# Patient Record
Sex: Female | Born: 1970 | Race: Black or African American | Hispanic: No | Marital: Married | Smoking: Never smoker
Health system: Southern US, Community
[De-identification: ages and names within clinical notes are randomized; demographics above are authoritative.]

## PROBLEM LIST (undated history)

## (undated) DIAGNOSIS — Z9221 Personal history of antineoplastic chemotherapy: Secondary | ICD-10-CM

## (undated) DIAGNOSIS — C50919 Malignant neoplasm of unspecified site of unspecified female breast: Secondary | ICD-10-CM

## (undated) DIAGNOSIS — Z8669 Personal history of other diseases of the nervous system and sense organs: Secondary | ICD-10-CM

## (undated) DIAGNOSIS — G43909 Migraine, unspecified, not intractable, without status migrainosus: Secondary | ICD-10-CM

## (undated) DIAGNOSIS — Z923 Personal history of irradiation: Secondary | ICD-10-CM

## (undated) DIAGNOSIS — I10 Essential (primary) hypertension: Secondary | ICD-10-CM

## (undated) DIAGNOSIS — T7840XA Allergy, unspecified, initial encounter: Secondary | ICD-10-CM

## (undated) HISTORY — DX: Migraine, unspecified, not intractable, without status migrainosus: G43.909

## (undated) HISTORY — DX: Malignant neoplasm of unspecified site of unspecified female breast: C50.919

## (undated) HISTORY — DX: Allergy, unspecified, initial encounter: T78.40XA

## (undated) HISTORY — PX: ABDOMINAL HYSTERECTOMY: SHX81

## (undated) HISTORY — DX: Personal history of antineoplastic chemotherapy: Z92.21

## (undated) HISTORY — DX: Personal history of irradiation: Z92.3

---

## 1999-05-25 ENCOUNTER — Other Ambulatory Visit: Admission: RE | Admit: 1999-05-25 | Discharge: 1999-05-25 | Payer: Self-pay | Admitting: *Deleted

## 1999-09-28 ENCOUNTER — Emergency Department (HOSPITAL_COMMUNITY): Admission: EM | Admit: 1999-09-28 | Discharge: 1999-09-28 | Payer: Self-pay | Admitting: Emergency Medicine

## 2000-05-02 ENCOUNTER — Other Ambulatory Visit: Admission: RE | Admit: 2000-05-02 | Discharge: 2000-05-02 | Payer: Self-pay | Admitting: *Deleted

## 2001-09-28 ENCOUNTER — Inpatient Hospital Stay (HOSPITAL_COMMUNITY): Admission: AD | Admit: 2001-09-28 | Discharge: 2001-09-28 | Payer: Self-pay | Admitting: Obstetrics and Gynecology

## 2002-10-12 ENCOUNTER — Other Ambulatory Visit: Admission: RE | Admit: 2002-10-12 | Discharge: 2002-10-12 | Payer: Self-pay | Admitting: Obstetrics and Gynecology

## 2003-02-13 ENCOUNTER — Inpatient Hospital Stay (HOSPITAL_COMMUNITY): Admission: AD | Admit: 2003-02-13 | Discharge: 2003-02-13 | Payer: Self-pay | Admitting: Obstetrics and Gynecology

## 2003-02-27 ENCOUNTER — Inpatient Hospital Stay (HOSPITAL_COMMUNITY): Admission: AD | Admit: 2003-02-27 | Discharge: 2003-02-27 | Payer: Self-pay | Admitting: Obstetrics and Gynecology

## 2003-02-27 ENCOUNTER — Encounter: Payer: Self-pay | Admitting: Obstetrics and Gynecology

## 2003-05-21 ENCOUNTER — Inpatient Hospital Stay (HOSPITAL_COMMUNITY): Admission: AD | Admit: 2003-05-21 | Discharge: 2003-05-23 | Payer: Self-pay | Admitting: Obstetrics and Gynecology

## 2003-07-17 ENCOUNTER — Other Ambulatory Visit: Admission: RE | Admit: 2003-07-17 | Discharge: 2003-07-17 | Payer: Self-pay | Admitting: Obstetrics and Gynecology

## 2007-05-12 ENCOUNTER — Emergency Department (HOSPITAL_COMMUNITY): Admission: EM | Admit: 2007-05-12 | Discharge: 2007-05-12 | Payer: Self-pay | Admitting: Family Medicine

## 2008-02-23 ENCOUNTER — Encounter: Admission: RE | Admit: 2008-02-23 | Discharge: 2008-02-23 | Payer: Self-pay | Admitting: Obstetrics and Gynecology

## 2008-05-31 ENCOUNTER — Encounter (INDEPENDENT_AMBULATORY_CARE_PROVIDER_SITE_OTHER): Payer: Self-pay | Admitting: Obstetrics and Gynecology

## 2008-05-31 ENCOUNTER — Inpatient Hospital Stay (HOSPITAL_COMMUNITY): Admission: AD | Admit: 2008-05-31 | Discharge: 2008-06-03 | Payer: Self-pay | Admitting: Obstetrics and Gynecology

## 2008-09-04 ENCOUNTER — Encounter: Admission: RE | Admit: 2008-09-04 | Discharge: 2008-09-04 | Payer: Self-pay | Admitting: Obstetrics and Gynecology

## 2010-10-27 NOTE — Op Note (Signed)
NAMEILLONA, Sherry Barry       ACCOUNT NO.:  [id number]   MEDICAL RECORD NO.:  [id number]          PATIENT TYPE:  INP   LOCATION:  9372                          FACILITY:  [facility]   PHYSICIAN:  Crist Fat. Rivard, M.D. DATE OF BIRTH:  01/16/1971   DATE OF PROCEDURE:  05/31/2008  DATE OF DISCHARGE:                               OPERATIVE REPORT   PREOPERATIVE DIAGNOSES:  Postpartum hemorrhage refractory to medical  treatment with cardiovascular compromise and uterine fibroids.   POSTOPERATIVE DIAGNOSES:  Postpartum hemorrhage refractory to medical  treatment with cardiovascular compromise and uterine fibroids.   ANESTHESIA:  General, Dr. Malen Gauze.   PROCEDURE:  Postpartum total abdominal hysterectomy with postoperative  cystoscopy.   SURGEON:  Crist Fat. Rivard, MD   ASSISTANT:  Elmira J. Lowell Guitar, PA   ESTIMATED BLOOD LOSS DURING THE PROCEDURE:  250 mL.   PROCEDURE IN DETAIL:  After being informed of the planned procedure with  possible complications including bleeding, infection, injury to ureters,  bladder or bowel, need for transfusion of more blood products, informed  consent was obtained.  The patient was taken to OR #4, given general  anesthesia with endotracheal intubation without any complication.  She  was placed in the dorsal decubitus position, prepped and draped in a  sterile fashion, and a Foley catheter was already in her bladder.  The  surgical prepping includes a vaginal prep.  She was then draped in the  usual fashion.   We infiltrated the midline area from the umbilicus to the symphysis  using 20 mL of Marcaine 0.25, and we performed a vertical incision from  the umbilicus to the symphysis.  This incision was brought sharply to  the fascia, which was also in size in a vertical fashion.  Peritoneum  was entered bluntly and incision was extended sharply.  Retractors were  placed, bowels were packed in the abdomen, and the uterus was delivered  entirely.  It was  enlarged, contained multiple fibroids, appears to be  firm but still was bleeding prior to induction of anesthesia.  We  proceeded rapidly to reach the uterine artery and reduced bleeding.  Both round ligaments were clamped, sectioned, and sutured with a  transfix suture of 0 Vicryl.  Pedicle containing tube and utero-ovarian  ligament was clamped on both sides, sectioned and doubly sutured first  with a transfix suture of 0 Vicryl and then with a free tie of 0 Vicryl.  This allowed easy entry in the broad ligament anteriorly, which was  opened sharply.  The bladder was easily retracted bluntly below the  cervix.  With traction on the uterus, we skeletonized the uterine  arteries.  We could identify the right ureter visually and left ureter  by palpation.  Uterine arteries were clamped on each side 14 minutes  after incision.  These pedicles were then sectioned and sutured.   Cardinal ligaments on both sides were clamped twice with straight Kocher  section and sutured with a transfix suture of 0 Vicryl.  This gives [place]  access to the uterosacral ligament on the right which was easier to  identify.  It was grasped with a Rogers forceps,  sectioned and sutured  with a transfix suture of 0 Vicryl, and maintained for future  identification.  At that time, we noted that the vaginal angle has been  opened and entered, which allowed [place] to do a circumferential incision  identifying the cervix all along and clamping the lip of the vagina as  we had go along.  This gives [place] access also to the left uterosacral  ligament, which was now easily identified.  Once the vaginal vault was  opened, it was clamped with a Rogers forceps, sectioned and sutured with  a transfix suture of 0 Vicryl.  We completed our posterior colpotomy and  the uterus was removed entirely with its cervix.  Each vaginal angle was  then sutured with a transfix suture of 0 Vicryl and the vaginal vault  was then closed with multiple  figure-of-eight stitches of 0 Vicryl,  reinforced midline with 3 figure-of-eight stitches of 0 Vicryl for  complete hemostasis.   At this point, we had blood loss of 250 mL including vaginal clots which  came back through the vaginal opening.   All pedicles were then systematically revised and the right round  ligament has to be sutured with a figure-of-eight stitch of 0 Vicryl for  hemostasis.  We noted some bleeding on the left posterior cul-de-sac  with a small laceration of the peritoneum.  This was inside the right  vaginal angle and we controlled it with a running lock suture of 3-0  Vicryl.   We proceeded with profuse irrigation with warm saline, and we noted a  satisfactory hemostasis.  The right ureter was easily identified,  is  nondilated, and had normal peristaltism.  The left ureter was more  difficult to identify due to some adhesions with the sigmoid colon and  so we decided to open the retroperitoneum bluntly and sharply until we  visualize it.  It was nondilated and showed minimal peristaltism.  It  did, however, traveled close to our uterine vessel clamping, although it  appears to dive underneath as expected.  This patient has had limited  urine output, and we do see on both sides limited peristaltism.  Nevertheless, hemostasis was deemed adequate.  Packs were removed,  retractors were removed, and we proceeded with closure of the abdomen.  We closed the peritoneum with a running lock suture of 3-0 Vicryl.  We  reapproximated the rectus abdominis with 2 simple stitches of 0 Vicryl  and closed the fascia with 2 running suture of 0 Vicryl meeting midline  after adequate hemostasis under fascia with cauterization.  The wound  was irrigated with warm saline.  Hemostasis was completed with cautery,  and the skin was closed with staples.  A compressive dressing was  applied.  The patient was repositioned in lithotomy and decision was  made to proceed with a postoperative  cystoscopy after giving the patient  a IV dose of indigo carmine.  The purpose of the cystoscopy was to  confirm complete flow through the left ureter.  We were able to  visualize a ureteral jet which appeared slow, but is nevertheless  identified on the left side.  We were unable to visualize the right  side, but there was no suspicion of injury perioperatively.  Cystoscope  was removed and a Foley catheter was reinserted in the bladder for  proper drainage with the urometer.   During the procedure, the patient received 1300 mL of normal saline and  3300 mL of LR.  She completed her  fifth unit of packed red cells and  also received 2 fresh frozen plasma.  Her urine output was 175 mL for 2-  hour of the procedure and total blood loss was 250 mL.   At the end of the procedure, she had a heart rate at 85 beats per minute  and a blood pressure of 108/60 with an O2 sat of 100%.   Instrument and sponge count was complete x2.  Estimated blood loss was  250 mL.  The procedure was very well tolerated by the patient who was  taken to recovery room in a well and stable condition.   SPECIMEN:  Uterus sent to Pathology.     Crist Fat Rivard, M.D.  Electronically Signed    SAR/MEDQ  D:  05/31/2008  T:  06/01/2008  Job:  [id number]

## 2010-10-27 NOTE — Discharge Summary (Signed)
Sherry Barry, Sherry Barry       ACCOUNT NO.:  [id number]   MEDICAL RECORD NO.:  [id number]          PATIENT TYPE:  INP   LOCATION:  9320                          [facility]   PHYSICIAN:  Osborn Coho, M.D.   DATE OF BIRTH:  1971-05-29   DATE OF ADMISSION:  05/31/2008  DATE OF DISCHARGE:  06/03/2008                               DISCHARGE SUMMARY   ADMISSION DIAGNOSES:  1. Intrauterine pregnancy at 40-1/7th weeks.  2. Active labor.   DISCHARGE DIAGNOSES:  1. Intrauterine pregnancy at 40-1/7th weeks.  2. Active labor.  3. Status post spontaneous vaginal delivery of a female infant, Apgars 9      and 9, weighing 9 pounds 1 ounce, compound hand presentation of      infant, postpartum hemorrhage and anemia secondary to postpartum      hemorrhage, and uterine fibroids.   HOSPITAL PROCEDURES:  1. Electronic fetal monitoring.  2. Bedside ultrasound.  3. Spontaneous vaginal delivery.  4. Medication management of postpartum hemorrhage.  5. General anesthesia.  6. Total abdominal hysterectomy.  7. Blood transfusion.  8. Cystoscopy.   HOSPITAL COURSE:  The patient entered the hospital on May 31, 2008,  in labor with cervical exam of 5 cm and bulging membranes.  She declined  epidural.  She progressed throughout the morning to spontaneous vaginal  delivery of a live born female infant, Apgars 9 and 9, weighing 9 pounds 1  ounce, over intact perineum.  Posterior shoulder was delivered with  compound hand presentation.  Later that morning, she had increased  vaginal bleeding and was treated with Pitocin, Methergine, and Cytotec.  She continued to have bleeding, received more medications, and fundal  massage by Dr. Su Hilt and Dr. Estanislado Pandy.  She was started on cefoxitin for  manual extraction of blood clots from the uterus.  Later that morning,  she required a blood transfusion secondary to hypotension and anemia.  She continued to bleed throughout the morning which required  blood  transfusions.  DIC panel came back remarkable for a low fibrinogen and  an elevated D-dimer.  Recommendation was made for hysterectomy after  continued increased bleeding.  Other options were discussed with the  patient and they elected to proceed with hysterectomy, which was  performed by Dr. Estanislado Pandy under general anesthesia.  She had a total  abdominal hysterectomy, blood transfusion, and cystoscopy done.  Estimated blood loss was 250 mL.  She was taken to [facility] for monitoring  on June 01, 2008.  On June 01, 2008, her blood pressure was  102/54.  Intake and output were sufficient.  She was tolerating a liquid  diet and continued to get blood transfusion to supplement her low  hemoglobin.  On postop day #2, June 02, 2008, she was doing better.  She had a headache, but no nausea or vomiting.  She was not passing gas,  but was tolerating diet.  She had a low-grade temp of 99.4 to 100.4.  Blood pressure was stable at 106/70.  Heart rate was stable at 75-90.  Oxygen saturations were 97-98%.  Lungs were clear.  Abdomen was soft and  appropriately tender.  Hemoglobin was  7.6.  She was placed on Augmentin  for low-grade fevers.  On postop day #3, June 03, 2008, she was seen  by Dr. Su Hilt.  She was doing well, but complaining of being sore.  She  was not having dizziness with being out of bed.  She was tolerating a  regular diet.  Vital signs were stable and she was afebrile for 24  hours.  She was passing gas.  Blood pressure was 134/86.  Abdomen was  soft and appropriately tender.  Incision with staples was clean, dry,  and intact.  Normal bowel sounds.  Lochia was normal.  Extremities  within normal limits.  Blood count showed a white blood cell count of  13.1 and a hemoglobin of 8.4 with a platelet count of 120.  She was  expressing desire to go home and was felt to be appropriate for  discharge home and was discharged home by Dr. Su Hilt.   DISCHARGE MEDICATIONS:   1. Motrin 600 mg p.o. q.6 h. p.r.n., pain.  2. Tylox 1-2 p.o. q.4 h. p.r.n., severe pain.  3. Iron sulfate 325 mg p.o. q.12 h.   DISCHARGE LABORATORY DATA:  White blood cell count 13.1, hemoglobin 8.4,  platelets 120.   DISCHARGE INSTRUCTIONS:  Per [facility] handout.  Discharge follow up will  occur as scheduled by the office on or about June 10, 2008, for  staple removal and general exam.   CONDITION ON DISCHARGE:  Good.      Marie L. Williams, C.N.M.      Osborn Coho, M.D.  Electronically Signed    MLW/MEDQ  D:  06/03/2008  T:  06/04/2008  Job:  [id number]

## 2010-10-27 NOTE — H&P (Signed)
NAMESHALEE, PAOLO       ACCOUNT NO.:  [id number]   MEDICAL RECORD NO.:  [id number]          PATIENT TYPE:  INP   LOCATION:  9162                          [facility]   PHYSICIAN:  Osborn Coho, M.D.   DATE OF BIRTH:  Feb 24, 1971   DATE OF ADMISSION:  05/31/2008  DATE OF DISCHARGE:                              HISTORY & PHYSICAL   [name] is a 40 year old gravida 5, para 1-0-3-1 at 40-1/7 weeks  who presents today with uterine contractions every 3-4 minutes since  1:00 a.m. Her cervix had been 3 cm in the office yesterday, and  membranes had been swept by Dr. Estanislado Pandy.  She has had some bloody show.  She reports positive fetal movement.  Pregnancy has been remarkable for:  1. advanced maternal age with amnio and first trimester screen      declined.  2. 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  3. History of fibroids.  4. One PAB1SAB.  5. Latex allergy.  6. Group B strep negative per patient report.  7. Left breast lump, likely benign.   PRENATAL LABS:  Blood type is B+, Rh antibody negative, VDRL  nonreactive, __________ titer positive, hepatitis B surface antigen  negative, HIV is nonreactive.  Sickle cell test was negative. GC and  chlamydia cultures were negative in July.  Pap was negative in July.  Hemoglobin upon entering the practice was 13.9, and it was within normal  limits at that 12.1 at 26 weeks. She declined first trimester screening  and quadruple screening.  Her Glucola was normal.  Group B strep culture  was negative at 36 weeks.   HISTORY OF PRESENT PREGNANCY:  The patient entered care at  approximately11-12 weeks. She declined first trimester screen.  She had  an ultrasound in the first trimester for dating which gave an [facility] of  May 30, 2008.  She did have 4 fibroids noted.  She had some swollen  glandular tissue at 16 weeks in her axilla.  She had another ultrasound  at 18 weeks for growth and development, which all were normal.  Placenta  was posterior. Adjusted risk was 1 in 604. She had some cramping at 25  weeks. At 26 weeks she reported a lump in her left breast since  beginning of pregnancy. Left breast exam showed a 1 cm nodule that was  mobile.  No nodes were noted.  She was sent for breast ultrasound. This  reviewed that it was probably benign and with a plan for follow-up at 6  months. She had another ultrasound at 32 weeks with normal growth and  stable fibroids. Group B strep culture was negative at 36 weeks.   OBSTETRICAL HISTORY:  In 1997 she had a termination of pregnancy at 6  weeks. In 2002 she had a termination of pregnancy at 6 weeks. In 2003  she had a spontaneous miscarriage at 9 weeks which passed naturally  without complication. In 2004 she had a vaginal birth of a female infant  weighing 7 pounds 8 ounces at 40 weeks.  She was in labor 7 hours. She  had epidural anesthesia.  She had  no complications other than the  epidural was placed so late that it did not work well.  She did have  some postpartum blues for about 2-4 weeks after delivery but did not  require any medication.  She had contractions in her last pregnancy at  24 weeks and was placed on Procardia. She did have fibroids noted with  her last pregnancy.   MEDICAL HISTORY:  She is a previous oral contraceptive user.  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  She reports  usual childhood illnesses. She had one UTI in a prior to pregnancy which  was treated with Macrobid.  She reports her epidural did not work with  her last pregnancy as an anesthetic complication. However, it was placed  very late in her labor. It started working after she delivered. She does  have a sensitivity to latex, which causes swelling.   FAMILY HISTORY:  Her maternal grandmother died from an MI in Oct 25, 2006.  Her  mother is hypertensive on medication.  Her mother  has a history of  varicosities.  Maternal grandfather had emphysema.  Maternal grandmother  also had Alzheimer's.  Her mother is alcoholic, and paternal uncles are  also alcoholic.   Genetic history is remarkable for the patient's age of 42, but genetic  testing was declined.   SOCIAL HISTORY:  The patient is single.  Father of baby is involved and  supportive. His name is [employer].  The patient is [place]  American of the [place] faith.  She has a college degree.  She is a  Warden/ranger. Her partner has his associates degree.  He is  currently a Consulting civil engineer and also employed full-time.  She has been followed  by the physician service [facility].  She denies any alcohol,  drug or tobacco use during this pregnancy.   PHYSICAL EXAM:  VITAL SIGNS: Stable.  The patient is febrile.  HEENT: Within normal limits.  LUNGS:  Breath sounds are clear.  HEART:  Regular rate and rhythm without murmur.  BREASTS:  Soft and nontender.  ABDOMEN:  Fundal height is approximately 38 cm.  Estimated fetal weight  7 to 7-1/2 pounds.  Uterine contractions every 3-5 minutes, moderate  quality.  Cervix is 5+, 100%, with bulging membranes, presenting part  was high, vertex was verified by bedside ultrasound with slightly an  oblique lie with the head to the maternal right. Fetal heart rate is  overall reactive.  There were 2 early decelerations noted, but these  were during the sleep cycle. There are no HSV lesions noted.  The  patient denies any prodrome.  EXTREMITIES:  Deep reflexes are 2+ without clonus.  There is trace edema  noted.   IMPRESSION:  1. Intrauterine pregnancy at 40-1/7 weeks.  2. Active labor.  3. Negative group B strep.  4. History of herpes simplex virus but no recent or current lesions.  5. Latex allergy.   PLAN:  1. Admit to birthing suite per consult with Dr. Osborn Coho as      attending physician.  2. Routine physician orders.  3. The patient  declines epidural at present. May desire IV pain      medication.      Renaldo Reel Emilee Hero, C.N.M.      Osborn Coho, M.D.  Electronically Signed   VLL/MEDQ  D:  05/31/2008  T:  05/31/2008  Job:  [id number]

## 2010-10-30 NOTE — H&P (Signed)
NAME:  Sherry Barry, Sherry Barry                        ACCOUNT NO.:  [id number]   MEDICAL RECORD NO.:  [id number]                   PATIENT TYPE:  INP   LOCATION:  9167                                 [facility]ILITY[facility]   PHYSICIAN:  Maxie Better, M.D.            DATE OF BIRTH:  [birth year]   DATE OF ADMISSION:  05/21/2003  DATE OF DISCHARGE:                                HISTORY & PHYSICAL   CHIEF COMPLAINT:  Post dates induction of labor.   HISTORY OF PRESENT ILLNESS:  This is a 40 year old, gravida 4, para 0-0-3-0,  single, black female, last menstrual period of August 09, 2002, [facility] of  May 18, 2003, now at 40-3/[redacted] weeks gestation admitted for induction of  labor secondary to post dates.  The patient has had pelvic pressure and good  fetal movement, intact membranes, group B Streptococcus culture negative,  and occasional contractions.  Her last exam in the office showed the cervix  to be closed, soft, -2, and vertex.  Her ultrasound on May 20, 2003,  showed an estimated fetal weight of 7 pounds 10 ounces and normal amniotic  fluid index.  Prenatal care is at [facility].  Primary obstetrician,  Maxie Better, M.D.   PRENATAL LABORATORY DATA:  Blood type is B positive.  Hemoglobin  electrophoresis is normal.  RPR is nonreactive.  Rubella is immune.  Hepatitis B surface antigen is negative.  HIV test was negative.  GC and  chlamydia culture was negative.  Pap smear was normal.  Normal fetal survey  at 20.3 weeks.  On December 31, 2002, the patient was noted to have several  uterine fibroids.  The patient declined AFP testing.  Normal one-hour  glucose challenge test.  Group B Streptococcus culture was negative.   ALLERGIES:  Sensitivity to LATEX from condoms.   PAST MEDICAL HISTORY:  Migraine headaches.   PAST SURGICAL HISTORY:  D&E x 2.   OBSTETRICAL HISTORY:  TAB x 2.  SAB x 2.   GYNECOLOGIC HISTORY:  Positive herpes simplex virus.   MEDICATIONS:  Prenatal  vitamins and Valtrex.   FAMILY HISTORY:  Noncontributory.   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   REVIEW OF SYSTEMS:  Negative, except as noted in the history of present  illness.   PHYSICAL EXAMINATION:  GENERAL APPEARANCE:  A well-developed, well-  nourished, gravid, black female in no acute distress.  VITAL SIGNS:  Blood pressure 94/55, pulse 87, afebrile.  SKIN:  No lesions.  HEENT:  Anicteric sclerae.  Pink conjunctivae.  Oropharynx negative.  HEART:  Regular rate and rhythm without murmurs.  LUNGS:  Clear to auscultation.  BREASTS:  Soft and nontender.  No palpable masses.  ABDOMEN:  Gravida.  Term.  PELVIC:  As per HPI.  EXTREMITIES:  No edema.   TRACING:  Baseline heart rate of 130.  Accelerations to 155.  Irregular  contractions.   IMPRESSION:  1.  Post dates.  2. History of herpes simplex virus.  3. Uterine fibroids.   PLAN:  1. Admission Cervidil.  2. Routine admission laboratories.  3. Low-dose Pitocin.  4. Analgesics p.r.n.  5. Amniotomy p.r.n.   The patient is currently asymptomatic with respect to prodrome or actual  outbreak of herpes simplex.  Therefore C-section is not indicated.                                               Maxie Better, M.D.    [place]/MEDQ  D:  05/21/2003  T:  05/21/2003  Job:  [id number]

## 2011-03-19 LAB — CROSSMATCH

## 2011-03-19 LAB — DIC (DISSEMINATED INTRAVASCULAR COAGULATION)PANEL
D-Dimer, Quant: 12.25 ug/mL-FEU — ABNORMAL HIGH (ref 0.00–0.48)
D-Dimer, Quant: 2.51 ug/mL-FEU — ABNORMAL HIGH (ref 0.00–0.48)
Fibrinogen: 288 mg/dL (ref 204–475)
Fibrinogen: 319 mg/dL (ref 204–475)
INR: 1.2 (ref 0.00–1.49)
Platelets: 115 10*3/uL — ABNORMAL LOW (ref 150–400)
Prothrombin Time: 15.9 seconds — ABNORMAL HIGH (ref 11.6–15.2)
Smear Review: NONE SEEN
aPTT: 25 seconds (ref 24–37)
aPTT: 27 seconds (ref 24–37)

## 2011-03-19 LAB — CBC
HCT: 17 % — ABNORMAL LOW (ref 36.0–46.0)
HCT: 27.1 % — ABNORMAL LOW (ref 36.0–46.0)
HCT: 32 % — ABNORMAL LOW (ref 36.0–46.0)
HCT: 34.7 % — ABNORMAL LOW (ref 36.0–46.0)
Hemoglobin: 5.8 g/dL — CL (ref 12.0–15.0)
Hemoglobin: 7.6 g/dL — CL (ref 12.0–15.0)
Hemoglobin: 8.4 g/dL — ABNORMAL LOW (ref 12.0–15.0)
Hemoglobin: 9.2 g/dL — ABNORMAL LOW (ref 12.0–15.0)
MCHC: 34.2 g/dL (ref 30.0–36.0)
MCHC: 34.3 g/dL (ref 30.0–36.0)
MCHC: 34.8 g/dL (ref 30.0–36.0)
MCHC: 34.8 g/dL (ref 30.0–36.0)
MCV: 84.8 fL (ref 78.0–100.0)
MCV: 86.2 fL (ref 78.0–100.0)
MCV: 86.9 fL (ref 78.0–100.0)
MCV: 87.3 fL (ref 78.0–100.0)
MCV: 89.6 fL (ref 78.0–100.0)
Platelets: 115 10*3/uL — ABNORMAL LOW (ref 150–400)
Platelets: 192 10*3/uL (ref 150–400)
Platelets: 210 10*3/uL (ref 150–400)
Platelets: 95 10*3/uL — ABNORMAL LOW (ref 150–400)
RBC: 2.49 MIL/uL — ABNORMAL LOW (ref 3.87–5.11)
RBC: 2.7 MIL/uL — ABNORMAL LOW (ref 3.87–5.11)
RBC: 2.71 MIL/uL — ABNORMAL LOW (ref 3.87–5.11)
RBC: 3.07 MIL/uL — ABNORMAL LOW (ref 3.87–5.11)
RBC: 3.66 MIL/uL — ABNORMAL LOW (ref 3.87–5.11)
RBC: 4.03 MIL/uL (ref 3.87–5.11)
RDW: 14.6 % (ref 11.5–15.5)
RDW: 14.7 % (ref 11.5–15.5)
RDW: 14.8 % (ref 11.5–15.5)
RDW: 15.3 % (ref 11.5–15.5)
WBC: 12.7 10*3/uL — ABNORMAL HIGH (ref 4.0–10.5)
WBC: 13.1 10*3/uL — ABNORMAL HIGH (ref 4.0–10.5)
WBC: 13.3 10*3/uL — ABNORMAL HIGH (ref 4.0–10.5)
WBC: 22.8 10*3/uL — ABNORMAL HIGH (ref 4.0–10.5)
WBC: 27.6 10*3/uL — ABNORMAL HIGH (ref 4.0–10.5)
WBC: 9.3 10*3/uL (ref 4.0–10.5)

## 2011-03-19 LAB — BASIC METABOLIC PANEL
CO2: 22 mEq/L (ref 19–32)
CO2: 22 mEq/L (ref 19–32)
Calcium: 7.2 mg/dL — ABNORMAL LOW (ref 8.4–10.5)
Calcium: 7.3 mg/dL — ABNORMAL LOW (ref 8.4–10.5)
Calcium: 7.4 mg/dL — ABNORMAL LOW (ref 8.4–10.5)
Chloride: 104 mEq/L (ref 96–112)
Creatinine, Ser: 0.44 mg/dL (ref 0.4–1.2)
Creatinine, Ser: 0.66 mg/dL (ref 0.4–1.2)
GFR calc Af Amer: >60 mL/min (ref >60)
GFR calc Af Amer: >60 mL/min (ref >60)
GFR calc non Af Amer: >60 mL/min (ref >60)
Glucose, Bld: 98 mg/dL (ref 70–99)
Sodium: 132 mEq/L — ABNORMAL LOW (ref 135–145)
Sodium: 136 mEq/L (ref 135–145)

## 2011-03-19 LAB — PREPARE FRESH FROZEN PLASMA

## 2011-03-19 LAB — DIFFERENTIAL
Basophils Absolute: 0.3 10*3/uL — ABNORMAL HIGH (ref 0.0–0.1)
Lymphs Abs: 1.7 10*3/uL (ref 0.7–4.0)
Monocytes Absolute: 1.9 10*3/uL — ABNORMAL HIGH (ref 0.1–1.0)
Monocytes Relative: 7 % (ref 3–12)
Neutrophils Relative %: 86 % — ABNORMAL HIGH (ref 43–77)

## 2011-03-23 LAB — POCT RAPID STREP A: Streptococcus, Group A Screen (Direct): POSITIVE — AB

## 2012-04-17 ENCOUNTER — Ambulatory Visit: Payer: Federal, State, Local not specified - PPO

## 2012-04-17 ENCOUNTER — Ambulatory Visit (INDEPENDENT_AMBULATORY_CARE_PROVIDER_SITE_OTHER): Payer: Federal, State, Local not specified - PPO | Admitting: Family Medicine

## 2012-04-17 VITALS — BP 124/82 | HR 96 | Temp 98.0°F | Resp 17 | Ht 63.0 in | Wt 140.0 lb

## 2012-04-17 DIAGNOSIS — M542 Cervicalgia: Secondary | ICD-10-CM

## 2012-04-17 DIAGNOSIS — G43909 Migraine, unspecified, not intractable, without status migrainosus: Secondary | ICD-10-CM | POA: Insufficient documentation

## 2012-04-17 NOTE — Progress Notes (Signed)
[facility] [address] [place] [address] [phone]  Date:  04/17/2012   Name:  Sherry Barry   DOB:  07-06-1970   MRN:  [id number]  PCP:  No primary provider on file.    Chief Complaint: Neck Pain, Dizziness and Fatigue   History of Present Illness:  Sherry Barry is a 41 y.o. very pleasant female patient who presents with the following:  This past Friday (today is Monday) she noted pain in the right side of her neck and she felt like she might pass out.  EMS came to her work and evaluated her- they did not find anything amiss and she went home.  They did note that her glucose was in the 70s at that time.  The pain lasted just a few seconds.  She had never experienced this in the past.   She still notes mild soreness in her neck.  There also seems to be some numbness in her right foot- in the middle toes- that comes and goes.   The more severe pain has not come back. She feels fatigued but has been worried about this incident and has not slept a lot since the incident.  History of migraine HA, but no headaches recently.      She was told that her right optic nerve was larger than normal at the eye doctor in August, but thee was nothing that needed to be done.   Surgical history- hysterecomy only.  She has never been a smoker.  She has been generally healthy.   No family history of aneurysms, carotid artery problems that she is aware of.    She has been told that she had a rapid heart rate in the past.     There is no problem list on file for this patient.   Past Medical History  Diagnosis Date  . Allergy   . Migraine     Past Surgical History  Procedure Date  . Abdominal hysterectomy     History  Substance Use Topics  . Smoking status: Never Smoker   . Smokeless tobacco: Not on file  . Alcohol Use: Yes    Family History  Problem Relation Age of Onset  . Hypertension Mother     Allergies  Allergen Reactions    . Codeine Itching    Medication list has been reviewed and updated.  No current outpatient prescriptions on file prior to visit.    Review of Systems:  As per HPI- otherwise negative.   Physical Examination: Filed Vitals:   04/17/12 1238  BP: 124/82  Pulse: 120  Temp: 98 F (36.7 C)  Resp: 17   Filed Vitals:   04/17/12 1238  Height: 5\' 3"  (1.6 m)  Weight: 140 lb (63.504 kg)   Body mass index is 24.80 kg/(m^2). Ideal Body Weight: Weight in (lb) to have BMI = 25: 140.8   GEN: WDWN, NAD, Non-toxic, A & O x 3, looks well HEENT: Atraumatic, Normocephalic. Neck supple. No masses, No LAD.  Bilateral TM wnl, oropharynx normal.  PEERL,EOMI.  Fundoscopic exam normal.  No carotid bruit.  I am not able to find any tender area in her neck at this time- the pain is currently resolved. She has normal cervical spine ROM Ears and Nose: No external deformity. CV: RRR, No M/G/R. No JVD. No thrill. No extra heart sounds. PULM: CTA B, no wheezes, crackles, rhonchi. No retractions. No resp. distress. No accessory muscle use. ABD: S,  NT, ND.  EXTR: No c/c/e NEURO Normal gait. Normal strength, sensation and DTR al extremities.  Her toe numbness is not present at this time- normal toe sensation and ROM, normal perfusion to her foot. Normal cerebellar function.  PSYCH: Normally interactive. Conversant. Not depressed or anxious appearing.  Calm demeanor.   [facility] reading (PRIMARY) by  Dr. Patsy Lager.  Normal C- spine  CERVICAL SPINE - COMPLETE 4+ VIEW  Comparison: Preliminary reading of Dr. Patsy Lager  Findings: Five views of the cervical spine submitted. No acute fracture or subluxation. Alignment, disc spaces and vertebral height are preserved. No neural foramina narrowing noted on oblique views. C1-C2 relationship is unremarkable. No prevertebral soft tissue swelling. Cervical airway is patent.  IMPRESSION: No acute fracture or subluxation  Assessment and Plan: 1. Neck pain  DG Cervical  Spine Complete, Carotid duplex   Blayke had an episode of right neck pain 3 days ago.  It has not returned, but she would like to determine the cause of her pain if possible.  Our next step will be to do a carotid artery ultrasound.  Tried to arrange this for today, but was not able to find an opening. Will be done tomorrow. She is ok with this plan.  If ultrasound normal and symptoms return we will do a CT or MRI as nerve compression is the most likely cuase.  She will seek emergency care if her symptoms return in the meantime.    Abbe Amsterdam, MD

## 2012-04-17 NOTE — Patient Instructions (Addendum)
Carotid Doppler can be done tomorrow at [facility] lab, at 2pm you must arrive at 1:45 to register at Outpatient Admitting, (1st floor) for the ultrasound.

## 2012-04-18 ENCOUNTER — Ambulatory Visit (HOSPITAL_COMMUNITY)
Admission: RE | Admit: 2012-04-18 | Discharge: 2012-04-18 | Disposition: A | Discharge status: Home / Self Care | Payer: Federal, State, Local not specified - PPO | Source: Ambulatory Visit | Attending: Family Medicine | Admitting: Family Medicine

## 2012-04-18 DIAGNOSIS — R55 Syncope and collapse: Secondary | ICD-10-CM | POA: Insufficient documentation

## 2012-04-18 DIAGNOSIS — M542 Cervicalgia: Secondary | ICD-10-CM | POA: Insufficient documentation

## 2012-04-18 DIAGNOSIS — G43909 Migraine, unspecified, not intractable, without status migrainosus: Secondary | ICD-10-CM | POA: Insufficient documentation

## 2012-04-18 DIAGNOSIS — R0989 Other specified symptoms and signs involving the circulatory and respiratory systems: Secondary | ICD-10-CM

## 2012-04-18 NOTE — Progress Notes (Signed)
Bilateral:  No evidence of hemodynamically significant internal carotid artery stenosis.   Vertebral artery flow is antegrade.     

## 2012-04-20 ENCOUNTER — Encounter: Payer: Self-pay | Admitting: Family Medicine

## 2012-04-20 ENCOUNTER — Telehealth: Payer: Self-pay | Admitting: Family Medicine

## 2012-04-20 NOTE — Telephone Encounter (Signed)
Sherry Barry called me back- she continued to complain of just not feeling well.  We planned to have her come in during my work day on 04/20/12 for re-evaluation

## 2012-04-27 ENCOUNTER — Encounter (HOSPITAL_COMMUNITY): Payer: Self-pay | Admitting: Emergency Medicine

## 2012-04-27 ENCOUNTER — Emergency Department (HOSPITAL_COMMUNITY)
Admission: EM | Admit: 2012-04-27 | Discharge: 2012-04-28 | Disposition: A | Discharge status: Home / Self Care | Payer: Federal, State, Local not specified - PPO | Attending: Emergency Medicine | Admitting: Emergency Medicine

## 2012-04-27 DIAGNOSIS — Z8669 Personal history of other diseases of the nervous system and sense organs: Secondary | ICD-10-CM | POA: Insufficient documentation

## 2012-04-27 DIAGNOSIS — I10 Essential (primary) hypertension: Secondary | ICD-10-CM | POA: Insufficient documentation

## 2012-04-27 NOTE — ED Notes (Signed)
Pt. States that she checked blood pressure earlier and it was high and continued to check throughout the day and continued to get higher;  She felt her heart racing so she came in to be seen.  Pt. Denies pain; however complains of weakness in arms.  Nausea this morning but denies nausea and vomiting at this time.

## 2012-04-28 NOTE — ED Notes (Signed)
Pt states she went to see her neurologists earlier today, BP was 147/108. Pt states she has never had BP issues before but checked it several times throughout day and it had not gone down.

## 2012-04-28 NOTE — ED Provider Notes (Addendum)
History     CSN: [id number]  Arrival date & time 04/27/12  2338   First MD Initiated Contact with Patient 04/28/12 0005      Chief Complaint  Patient presents with  . Hypertension    (Consider location/radiation/quality/duration/timing/severity/associated sxs/prior treatment) Patient is a 41 y.o. female presenting with hypertension. The history is provided by the patient.  Hypertension  Patient here with increased blood pressure at home since starting Claritin-D. Denies any chest pain chest pressure. Denies shortness of breath. No headaches or vomiting. No abscess confusion. Denies any prior history of high blood pressure. Blood pressure home at systolic of 160 and diastolic of 100. Patient is not taking any other medications at this time and a  Past Medical History  Diagnosis Date  . Allergy   . Migraine   . Migraine     Past Surgical History  Procedure Date  . Abdominal hysterectomy     Family History  Problem Relation Age of Onset  . Hypertension Mother     History  Substance Use Topics  . Smoking status: Never Smoker   . Smokeless tobacco: Not on file  . Alcohol Use: Yes    OB History    Grav Para Term Preterm Abortions TAB SAB Ect Mult Living                  Review of Systems  All other systems reviewed and are negative.    Allergies  Codeine and Latex  Home Medications   Current Outpatient Rx  Name  Route  Sig  Dispense  Refill  . ASPIRIN 81 MG PO CHEW   Oral   Chew 81 mg by mouth daily.         [name] LORATADINE-PSEUDOEPHEDRINE ER 10-240 MG PO TB24   Oral   Take 1 tablet by mouth daily.           BP 154/103  Temp 98.7 F (37.1 C) (Oral)  Resp 18  SpO2 100%  Physical Exam  Nursing note and vitals reviewed. Constitutional: She is oriented to person, place, and time. She appears well-developed and well-nourished.  Non-toxic appearance. No distress.  HENT:  Head: Normocephalic and atraumatic.  Eyes: Conjunctivae normal, EOM and  lids are normal. Pupils are equal, round, and reactive to light.  Neck: Normal range of motion. Neck supple. No tracheal deviation present. No mass present.  Cardiovascular: Normal rate, regular rhythm and normal heart sounds.  Exam reveals no gallop.   No murmur heard. Pulmonary/Chest: Effort normal and breath sounds normal. No stridor. No respiratory distress. She has no decreased breath sounds. She has no wheezes. She has no rhonchi. She has no rales.  Abdominal: Soft. Normal appearance and bowel sounds are normal. She exhibits no distension. There is no tenderness. There is no rebound and no CVA tenderness.  Musculoskeletal: Normal range of motion. She exhibits no edema and no tenderness.  Neurological: She is alert and oriented to person, place, and time. She has normal strength. No cranial nerve deficit or sensory deficit. GCS eye subscore is 4. GCS verbal subscore is 5. GCS motor subscore is 6.  Skin: Skin is warm and dry. No abrasion and no rash noted.  Psychiatric: She has a normal mood and affect. Her speech is normal and behavior is normal.    ED Course  Procedures (including critical care time)  Labs Reviewed - No data to display No results found.   No diagnosis found.    MDM  Patient's blood  pressure elevated likely to her use of pseudoephedrine. She has no focal neurological findings at this time. She denies any coronary symptoms. She was instructed to discontinue her pseudoephedrine and followup with her Dr.        Toy Baker, MD 04/28/12 0025  Toy Baker, MD 06/14/12 [id number]

## 2012-05-02 ENCOUNTER — Ambulatory Visit (INDEPENDENT_AMBULATORY_CARE_PROVIDER_SITE_OTHER): Payer: Federal, State, Local not specified - PPO | Admitting: Emergency Medicine

## 2012-05-02 VITALS — BP 142/100 | HR 102 | Temp 98.2°F | Resp 16 | Ht 62.5 in | Wt 138.8 lb

## 2012-05-02 DIAGNOSIS — J302 Other seasonal allergic rhinitis: Secondary | ICD-10-CM

## 2012-05-02 DIAGNOSIS — I10 Essential (primary) hypertension: Secondary | ICD-10-CM | POA: Insufficient documentation

## 2012-05-02 DIAGNOSIS — R000 Tachycardia, unspecified: Secondary | ICD-10-CM

## 2012-05-02 DIAGNOSIS — J309 Allergic rhinitis, unspecified: Secondary | ICD-10-CM

## 2012-05-02 MED ORDER — TRIAMCINOLONE ACETONIDE(NASAL) 55 MCG/ACT NA INHA: 2.0000 | Freq: Every day | NASAL | Status: DC

## 2012-05-02 MED ORDER — LISINOPRIL-HYDROCHLOROTHIAZIDE 10-12.5 MG PO TABS: 1.0000 | ORAL_TABLET | Freq: Every day | ORAL | Status: DC

## 2012-05-02 NOTE — Progress Notes (Signed)
   [address] [place] [address]   Phone [phone]  Subjective:    Patient ID: Sherry Barry, female    DOB: September 15, 1970, 41 y.o.   MRN: [id number]  HPI  Pt presents to clinic with concerns regarding her BP.  She has noticed that it has been running high in the 160s/100s.  She went to the ED last week for eval.  They told her normal EKG and that she did not need meds.  They told her related to her claritin use (though she does not use claritin D) for her seasonal allergies which cause to be congested at night.  She has no CP, SOB, vision changes.  She has been having some dull headaches.  She has family history of HTN.  Review of Systems  Constitutional: Negative for fever and chills.  HENT: Positive for congestion.   Eyes: Negative for visual disturbance.  Respiratory: Negative for shortness of breath.   Cardiovascular: Negative for chest pain, palpitations and leg swelling.  Gastrointestinal: Negative for nausea.  Neurological: Positive for headaches (dull headache - differnt than her normal migraines).       Objective:   Physical Exam  Vitals reviewed. Constitutional: She is oriented to person, place, and time. She appears well-developed and well-nourished.  Eyes: Conjunctivae normal and EOM are normal. Pupils are equal, round, and reactive to light.  Fundoscopic exam:      The right eye shows no hemorrhage and no papilledema.       The left eye shows no hemorrhage and no papilledema.  Neck: Neck supple. No thyromegaly present.  Cardiovascular: Regular rhythm and normal heart sounds.  Tachycardia present.  Exam reveals no gallop.   No murmur heard. Pulmonary/Chest: Effort normal and breath sounds normal.  Neurological: She is alert and oriented to person, place, and time.  Skin: Skin is warm and dry.  Psychiatric: She has a normal mood and affect. Her behavior is normal. Judgment and thought content normal.    EKG intrepretation - NSR no acute changes. No  LVH.      Assessment & Plan:   1. HTN (hypertension)  Comprehensive metabolic panel, TSH, EKG 12-Lead  2. Tachycardia  TSH  3. Seasonal allergies  triamcinolone (NASACORT AQ) 55 MCG/ACT nasal inhaler   Will start lisinopril/HCTZ and recheck patient in 2 wks.  D/w pt possible SE of meds and to take dose in the am.  D/w Dr. Dareen Piano.

## 2012-05-03 LAB — COMPREHENSIVE METABOLIC PANEL
ALT: 11 U/L (ref 0–35)
CO2: 26 mEq/L (ref 19–32)
Calcium: 11 mg/dL — ABNORMAL HIGH (ref 8.4–10.5)
Chloride: 101 mEq/L (ref 96–112)
Creat: 0.8 mg/dL (ref 0.50–1.10)
Glucose, Bld: 82 mg/dL (ref 70–99)
Sodium: 138 mEq/L (ref 135–145)
Total Bilirubin: 0.5 mg/dL (ref 0.3–1.2)
Total Protein: 8.4 g/dL — ABNORMAL HIGH (ref 6.0–8.3)

## 2012-05-03 LAB — TSH: TSH: 2.16 u[IU]/mL (ref 0.350–4.500)

## 2012-05-04 ENCOUNTER — Ambulatory Visit (INDEPENDENT_AMBULATORY_CARE_PROVIDER_SITE_OTHER): Payer: Federal, State, Local not specified - PPO | Admitting: Emergency Medicine

## 2012-05-04 VITALS — BP 127/83 | HR 96 | Temp 98.4°F | Resp 16 | Ht 62.75 in | Wt 137.8 lb

## 2012-05-04 DIAGNOSIS — R002 Palpitations: Secondary | ICD-10-CM

## 2012-05-04 NOTE — Progress Notes (Signed)
  Subjective:    Patient ID: Sherry Barry, female    DOB: Oct 13, 1970, 41 y.o.   MRN: [id number]  HPI Comments: Few minute duration pounding and rapid heart beat.  Says that she "always" has a rapid HR when she goes to the doctor.  Denies prior sensation of rapid or irregular or forceful beats.  No antecedent illness.  No chest pain, shortness of breath, diaphoresis or nausea with this episode.  Denies drugs or excess caffeine.  Palpitations  This is a new problem. The current episode started today. The problem occurs intermittently. The problem has been resolved. Nothing aggravates the symptoms. Associated symptoms include weakness (says legs were weak for short time around period of palpitations). Pertinent negatives include no anxiety, chest fullness, chest pain, coughing, diaphoresis, dizziness, fever, irregular heartbeat, malaise/fatigue, nausea, near-syncope, numbness, shortness of breath, syncope or vomiting. She has tried nothing for the symptoms. There are no known risk factors. Her past medical history is significant for hyperthyroidism. There is no history of anemia, anxiety, drug use, heart disease or a valve disorder.      Review of Systems  Constitutional: Negative for fever, malaise/fatigue, diaphoresis, activity change and appetite change.  HENT: Negative.   Eyes: Negative.   Respiratory: Negative for cough, choking, chest tightness and shortness of breath.   Cardiovascular: Positive for palpitations. Negative for chest pain, leg swelling, syncope and near-syncope.  Gastrointestinal: Negative for nausea and vomiting.  Genitourinary: Negative for dysuria, urgency and frequency.  Musculoskeletal: Negative.   Skin: Negative.   Neurological: Positive for weakness (says legs were weak for short time around period of palpitations). Negative for dizziness and numbness.       Objective:   Physical Exam  Constitutional: She is oriented to person, place, and time. She  appears well-developed and well-nourished.  HENT:  Head: Normocephalic and atraumatic.  Right Ear: External ear normal.  Left Ear: External ear normal.  Eyes: Conjunctivae normal are normal. Pupils are equal, round, and reactive to light. No scleral icterus.  Neck: Normal range of motion. Neck supple. No thyromegaly present.  Cardiovascular: Normal rate, regular rhythm and normal heart sounds.  Exam reveals no gallop and no friction rub.   No murmur heard. Pulmonary/Chest: Effort normal and breath sounds normal.  Abdominal: Soft. Bowel sounds are normal.  Musculoskeletal: Normal range of motion. She exhibits no edema.  Neurological: She is alert and oriented to person, place, and time.  Skin: Skin is warm and dry.          Assessment & Plan:  Hypertension Palpations Reviewed labs from last visit all normal Continue medication Echocardiogram Follow up in one month or sooner for new or worsened symptoms

## 2012-05-14 NOTE — Progress Notes (Signed)
Reviewed and agree.

## 2012-05-23 ENCOUNTER — Other Ambulatory Visit: Payer: Self-pay | Admitting: Family Medicine

## 2012-05-23 DIAGNOSIS — D249 Benign neoplasm of unspecified breast: Secondary | ICD-10-CM

## 2012-05-29 ENCOUNTER — Ambulatory Visit (INDEPENDENT_AMBULATORY_CARE_PROVIDER_SITE_OTHER): Payer: Federal, State, Local not specified - PPO | Admitting: Emergency Medicine

## 2012-05-29 VITALS — BP 126/87 | HR 73 | Temp 98.0°F | Resp 16 | Ht 62.5 in | Wt 137.4 lb

## 2012-05-29 DIAGNOSIS — H8110 Benign paroxysmal vertigo, unspecified ear: Secondary | ICD-10-CM

## 2012-05-29 DIAGNOSIS — J309 Allergic rhinitis, unspecified: Secondary | ICD-10-CM

## 2012-05-29 MED ORDER — MECLIZINE HCL 50 MG PO TABS: 50.0000 mg | ORAL_TABLET | Freq: Three times a day (TID) | ORAL | Status: DC | PRN

## 2012-05-29 MED ORDER — MOMETASONE FUROATE 50 MCG/ACT NA SUSP: 2.0000 | Freq: Every day | NASAL | Status: DC

## 2012-05-29 NOTE — Progress Notes (Signed)
[facility] [address], [place] [address] [phone]  Date:  05/29/2012   Name:  Sherry Barry   DOB:  [birth year]   MRN:  [id number]  PCP:  Leanor Rubenstein, MD    Chief Complaint: Dizziness   History of Present Illness:  Sherry Barry is a 41 y.o. very pleasant female patient who presents with the following:  Developed dizziness that worsens with change in position of head or when closes eyes.  Began 2 nights ago associated with pain in left ear when tries to valsalva or swallow. .  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 Never evaluated. Has clear nasal drainage and post nasal drip.  No fever or chills.  Nonproductive cough.  No wheezing or shortness of breath.  No other neuro or visual symptoms.  No history of injury or antecedent illness.  Patient Active Problem List  Diagnosis  . Migraine  . HTN (hypertension)    Past Medical History  Diagnosis Date  . Allergy   . Migraine   . Migraine     Past Surgical History  Procedure Date  . Abdominal hysterectomy     History  Substance Use Topics  . Smoking status: Never Smoker   . Smokeless tobacco: Not on file  . Alcohol Use: Yes    Family History  Problem Relation Age of Onset  . Hypertension Mother   . Alcohol abuse Mother   . Heart disease Maternal Grandmother   . Stroke Maternal Grandfather     Allergies  Allergen Reactions  . Codeine Itching  . Latex Swelling    Medication list has been reviewed and updated.  Current Outpatient Prescriptions on File Prior to Visit  Medication Sig Dispense Refill  . aspirin 81 MG chewable tablet Chew 81 mg by mouth daily.      [name] triamcinolone (NASACORT AQ) 55 MCG/ACT nasal inhaler Place 2 sprays into the nose daily.  1 Inhaler  1    Review of Systems:  As per HPI, otherwise negative.    Physical Examination: Filed Vitals:   05/29/12 1350  BP: 126/87  Pulse: 73  Temp: 98 F (36.7 C)  Resp:  16   Filed Vitals:   05/29/12 1350  Height: 5' 2.5" (1.588 m)  Weight: 137 lb 6.4 oz (62.324 kg)   Body mass index is 24.73 kg/(m^2). Ideal Body Weight: Weight in (lb) to have BMI = 25: 138.6   GEN: WDWN, NAD, Non-toxic, A & O x 3 HEENT: Atraumatic, Normocephalic. Neck supple. No masses, No LAD.  Fundi benign Ears and Nose: No external deformity.  TM negative CV: RRR, No M/G/R. No JVD. No thrill. No extra heart sounds. PULM: CTA B, no wheezes, crackles, rhonchi. No retractions. No resp. distress. No accessory muscle use. ABD: S, NT, ND, +BS. No rebound. No HSM. EXTR: No c/c/e NEURO Normal gait, balance and coordination.  PRRERLA EOMI CN 2-12 intact.  Bilateral lateral gaze nystagmus and rotary nystagmus. PSYCH: Normally interactive. Conversant. Not depressed or anxious appearing.  Calm demeanor.    Assessment and Plan: Benign positional vertigo SAR by history  Nasonex antivert Follow up if not improved  Carmelina Dane, MD

## 2012-05-29 NOTE — Patient Instructions (Signed)
Benign Positional Vertigo Vertigo means you feel like you or your surroundings are moving when they are not. Benign positional vertigo is the most common form of vertigo. Benign means that the cause of your condition is not serious. Benign positional vertigo is more common in older adults. CAUSES  Benign positional vertigo is the result of an upset in the labyrinth system. This is an area in the middle ear that helps control your balance. This may be caused by a viral infection, head injury, or repetitive motion. However, often no specific cause is found. SYMPTOMS  Symptoms of benign positional vertigo occur when you move your head or eyes in different directions. Some of the symptoms may include:  Loss of balance and falls.  Vomiting.  Blurred vision.  Dizziness.  Nausea.  Involuntary eye movements (nystagmus). DIAGNOSIS  Benign positional vertigo is usually diagnosed by physical exam. If the specific cause of your benign positional vertigo is unknown, your caregiver may perform imaging tests, such as magnetic resonance imaging (MRI) or computed tomography (CT). TREATMENT  Your caregiver may recommend movements or procedures to correct the benign positional vertigo. Medicines such as meclizine, benzodiazepines, and medicines for nausea may be used to treat your symptoms. In rare cases, if your symptoms are caused by certain conditions that affect the inner ear, you may need surgery. HOME CARE INSTRUCTIONS   Follow your caregiver's instructions.  Move slowly. Do not make sudden body or head movements.  Avoid driving.  Avoid operating heavy machinery.  Avoid performing any tasks that would be dangerous to you or others during a vertigo episode.  Drink enough fluids to keep your urine clear or pale yellow. SEEK IMMEDIATE MEDICAL CARE IF:   You develop problems with walking, weakness, numbness, or using your arms, hands, or legs.  You have difficulty speaking.  You develop  severe headaches.  Your nausea or vomiting continues or gets worse.  You develop visual changes.  Your family or friends notice any behavioral changes.  Your condition gets worse.  You have a fever.  You develop a stiff neck or sensitivity to light. MAKE SURE YOU:   Understand these instructions.  Will watch your condition.  Will get help right away if you are not doing well or get worse. Document Released: 03/08/2006 Document Revised: 08/23/2011 Document Reviewed: 02/18/2011 [facility] Patient Information 2013 [place], [place]. Allergic Rhinitis Allergic rhinitis is when the mucous membranes in the nose respond to allergens. Allergens are particles in the air that cause your body to have an allergic reaction. This causes you to release allergic antibodies. Through a chain of events, these eventually cause you to release histamine into the blood stream (hence the use of antihistamines). Although meant to be protective to the body, it is this release that causes your discomfort, such as frequent sneezing, congestion and an itchy runny nose.  CAUSES  The pollen allergens may come from grasses, trees, and weeds. This is seasonal allergic rhinitis, or "hay fever." Other allergens cause year-round allergic rhinitis (perennial allergic rhinitis) such as house dust mite allergen, pet dander and mold spores.  SYMPTOMS   Nasal stuffiness (congestion).  Runny, itchy nose with sneezing and tearing of the eyes.  There is often an itching of the mouth, eyes and ears. It cannot be cured, but it can be controlled with medications. DIAGNOSIS  If you are unable to determine the offending allergen, skin or blood testing may find it. TREATMENT   Avoid the allergen.  Medications and allergy shots (immunotherapy)  can help.  Hay fever may often be treated with antihistamines in pill or nasal spray forms. Antihistamines block the effects of histamine. There are over-the-counter medicines that may  help with nasal congestion and swelling around the eyes. Check with your caregiver before taking or giving this medicine. If the treatment above does not work, there are many new medications your caregiver can prescribe. Stronger medications may be used if initial measures are ineffective. Desensitizing injections can be used if medications and avoidance fails. Desensitization is when a patient is given ongoing shots until the body becomes less sensitive to the allergen. Make sure you follow up with your caregiver if problems continue. SEEK MEDICAL CARE IF:   You develop fever (more than 100.5 F (38.1 C).  You develop a cough that does not stop easily (persistent).  You have shortness of breath.  You start wheezing.  Symptoms interfere with normal daily activities. Document Released: 02/23/2001 Document Revised: 08/23/2011 Document Reviewed: 09/04/2008 [facility] Patient Information 2013 [place], [place].

## 2012-06-01 ENCOUNTER — Other Ambulatory Visit: Payer: Federal, State, Local not specified - PPO

## 2012-06-05 ENCOUNTER — Ambulatory Visit
Admission: RE | Admit: 2012-06-05 | Discharge: 2012-06-05 | Disposition: A | Discharge status: Home / Self Care | Payer: Federal, State, Local not specified - PPO | Source: Ambulatory Visit | Attending: Family Medicine | Admitting: Family Medicine

## 2012-06-05 ENCOUNTER — Other Ambulatory Visit: Payer: Self-pay | Admitting: Family Medicine

## 2012-06-05 DIAGNOSIS — R921 Mammographic calcification found on diagnostic imaging of breast: Secondary | ICD-10-CM

## 2012-06-05 DIAGNOSIS — D249 Benign neoplasm of unspecified breast: Secondary | ICD-10-CM

## 2012-06-16 ENCOUNTER — Other Ambulatory Visit: Payer: Self-pay | Admitting: Family Medicine

## 2012-06-16 ENCOUNTER — Ambulatory Visit
Admission: RE | Admit: 2012-06-16 | Discharge: 2012-06-16 | Disposition: A | Discharge status: Home / Self Care | Payer: Federal, State, Local not specified - PPO | Source: Ambulatory Visit | Attending: Family Medicine | Admitting: Family Medicine

## 2012-06-16 DIAGNOSIS — R921 Mammographic calcification found on diagnostic imaging of breast: Secondary | ICD-10-CM

## 2012-06-19 ENCOUNTER — Other Ambulatory Visit: Payer: Self-pay | Admitting: Family Medicine

## 2012-06-19 ENCOUNTER — Ambulatory Visit
Admission: RE | Admit: 2012-06-19 | Discharge: 2012-06-19 | Disposition: A | Discharge status: Home / Self Care | Payer: Federal, State, Local not specified - PPO | Source: Ambulatory Visit | Attending: Family Medicine | Admitting: Family Medicine

## 2012-06-19 DIAGNOSIS — C50911 Malignant neoplasm of unspecified site of right female breast: Secondary | ICD-10-CM

## 2012-06-19 DIAGNOSIS — R921 Mammographic calcification found on diagnostic imaging of breast: Secondary | ICD-10-CM

## 2012-06-20 ENCOUNTER — Telehealth: Payer: Self-pay | Admitting: *Deleted

## 2012-06-20 DIAGNOSIS — C50211 Malignant neoplasm of upper-inner quadrant of right female breast: Secondary | ICD-10-CM | POA: Insufficient documentation

## 2012-06-20 DIAGNOSIS — Z170 Estrogen receptor positive status [ER+]: Secondary | ICD-10-CM | POA: Insufficient documentation

## 2012-06-20 DIAGNOSIS — C50219 Malignant neoplasm of upper-inner quadrant of unspecified female breast: Secondary | ICD-10-CM

## 2012-06-20 NOTE — Telephone Encounter (Signed)
Confirmed [facility] for 06/28/12 at 0800 .  Instructions and contact information given.

## 2012-06-23 ENCOUNTER — Ambulatory Visit
Admission: RE | Admit: 2012-06-23 | Discharge: 2012-06-23 | Disposition: A | Discharge status: Home / Self Care | Payer: Federal, State, Local not specified - PPO | Source: Ambulatory Visit | Attending: Family Medicine | Admitting: Family Medicine

## 2012-06-23 ENCOUNTER — Other Ambulatory Visit: Payer: Self-pay | Admitting: Family Medicine

## 2012-06-23 DIAGNOSIS — R928 Other abnormal and inconclusive findings on diagnostic imaging of breast: Secondary | ICD-10-CM

## 2012-06-23 DIAGNOSIS — C50911 Malignant neoplasm of unspecified site of right female breast: Secondary | ICD-10-CM

## 2012-06-23 MED ORDER — GADOBENATE DIMEGLUMINE 529 MG/ML IV SOLN
13.0000 mL | Freq: Once | INTRAVENOUS | Status: AC | PRN
  Administered 2012-06-23: 13 mL via INTRAVENOUS

## 2012-06-28 ENCOUNTER — Ambulatory Visit: Payer: BC Managed Care – PPO

## 2012-06-28 ENCOUNTER — Ambulatory Visit
Admission: RE | Admit: 2012-06-28 | Discharge: 2012-06-28 | Disposition: A | Discharge status: Home / Self Care | Payer: BC Managed Care – PPO | Source: Ambulatory Visit | Attending: Radiation Oncology | Admitting: Radiation Oncology

## 2012-06-28 ENCOUNTER — Encounter: Payer: Self-pay | Admitting: *Deleted

## 2012-06-28 ENCOUNTER — Other Ambulatory Visit (INDEPENDENT_AMBULATORY_CARE_PROVIDER_SITE_OTHER): Payer: Self-pay | Admitting: Surgery

## 2012-06-28 ENCOUNTER — Other Ambulatory Visit (HOSPITAL_BASED_OUTPATIENT_CLINIC_OR_DEPARTMENT_OTHER): Payer: BC Managed Care – PPO | Admitting: Lab

## 2012-06-28 ENCOUNTER — Ambulatory Visit: Payer: BC Managed Care – PPO | Attending: Surgery | Admitting: Physical Therapy

## 2012-06-28 ENCOUNTER — Telehealth: Payer: Self-pay | Admitting: *Deleted

## 2012-06-28 ENCOUNTER — Ambulatory Visit (HOSPITAL_BASED_OUTPATIENT_CLINIC_OR_DEPARTMENT_OTHER): Payer: BC Managed Care – PPO | Admitting: Oncology

## 2012-06-28 ENCOUNTER — Encounter: Payer: Self-pay | Admitting: Oncology

## 2012-06-28 ENCOUNTER — Encounter (INDEPENDENT_AMBULATORY_CARE_PROVIDER_SITE_OTHER): Payer: Self-pay | Admitting: Surgery

## 2012-06-28 ENCOUNTER — Ambulatory Visit (HOSPITAL_BASED_OUTPATIENT_CLINIC_OR_DEPARTMENT_OTHER): Payer: BC Managed Care – PPO | Admitting: Surgery

## 2012-06-28 VITALS — BP 143/105 | HR 88 | Temp 98.2°F | Resp 20 | Ht 62.5 in | Wt 137.2 lb

## 2012-06-28 DIAGNOSIS — C50919 Malignant neoplasm of unspecified site of unspecified female breast: Secondary | ICD-10-CM

## 2012-06-28 DIAGNOSIS — IMO0001 Reserved for inherently not codable concepts without codable children: Secondary | ICD-10-CM | POA: Insufficient documentation

## 2012-06-28 DIAGNOSIS — C50219 Malignant neoplasm of upper-inner quadrant of unspecified female breast: Secondary | ICD-10-CM

## 2012-06-28 DIAGNOSIS — R293 Abnormal posture: Secondary | ICD-10-CM | POA: Insufficient documentation

## 2012-06-28 DIAGNOSIS — Z170 Estrogen receptor positive status [ER+]: Secondary | ICD-10-CM

## 2012-06-28 LAB — CBC WITH DIFFERENTIAL/PLATELET
EOS%: 1.4 % (ref 0.0–7.0)
MCH: 30.4 pg (ref 25.1–34.0)
MCHC: 35.4 g/dL (ref 31.5–36.0)
MCV: 85.8 fL (ref 79.5–101.0)
MONO%: 10.9 % (ref 0.0–14.0)
RBC: 5.14 10*6/uL (ref 3.70–5.45)
RDW: 12.5 % (ref 11.2–14.5)

## 2012-06-28 LAB — COMPREHENSIVE METABOLIC PANEL (CC13)
AST: 16 U/L (ref 5–34)
Albumin: 3.6 g/dL (ref 3.5–5.0)
Alkaline Phosphatase: 61 U/L (ref 40–150)
Potassium: 3.7 mEq/L (ref 3.5–5.1)
Sodium: 141 mEq/L (ref 136–145)
Total Protein: 7.7 g/dL (ref 6.4–8.3)

## 2012-06-28 NOTE — Progress Notes (Signed)
Checked in new pt with no financial concerns. °

## 2012-06-28 NOTE — Progress Notes (Signed)
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 At that time it digital diagnostic mammography showed pleomorphic calcifications extending over 9 cm in the inner third of the right breast. There were no suspicious calcifications in the left breast. On exam, Dr. Lyman Bishop was able to palpate a small freely mobile mass in the lower inner left breast. Ultrasound showed a horizontally oriented hypoechoic macrolobulated mass in the left breast measuring approximately 1.3 cm. 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  Bilateral breast MRI 06/23/2012 showed an enhancing mass in the upper central right breast measuring 1.8 cm, together with clumped nodular enhancement extending for a total area of 11.4 cm. In the upper outer quadrant of this breast there were 3 discrete irregular masses measuring up to 3 cm. This correlated well with the ultrasound findings previously. In the left breast there was a large area of clumped nodular enhancement measuring up to 8.9 cm. In the lower central portion there was a 1.7 cm circumscribed nodule consistent with a known fibroadenoma. There weas no axillary or internal mammary adenopathy noted on either side. The patient's subsequent history is as detailed below  INTERVAL HISTORY: Happy was seen at the multidisciplinary breast cancer clinic 06/27/2012, accompanied by her husband Cristal Deer.   REVIEW OF SYSTEMS: There were no specific symptoms leading to her mammogram. She did  have some neck pain October of 2013, and plain films of the cervical spine 04/17/2012 were unremarkable. She has a history of migraines developing after age 83, but they're not quite rare. She has a history of palpitations in the past, associated with stress, but again these have not recurred. Sometimes she has pain in her legs, she can have some numbness in her fingers and toes, ringing in her ears, and some sinus issues. Overall however a detailed review of systems today was noncontributory except as noted.  PAST MEDICAL HISTORY: Past Medical History  Diagnosis Date  . Allergy   . Migraine   . Migraine   . Breast cancer     PAST SURGICAL HISTORY: Past Surgical History  Procedure Date  . Abdominal hysterectomy     no salpingo-oophorectomy    FAMILY HISTORY Family History  Problem Relation Age of Onset  . Hypertension Mother   . Alcohol abuse Mother   . Heart disease Maternal Grandmother   . Stroke Maternal Grandfather   . Colon cancer Maternal Aunt   . Brain cancer Paternal Uncle    the patient's father died in an automobile accident in his early 37s. The patient's mother is living, currently age 77. The patient has one brother and one sister. There is no family history of breast or ovarian cancer, although there are maternal uncles with brain, colon and lymph node cancers.  GYNECOLOGIC HISTORY: Menarche age 63, first live birth age 65, she is GX P2. She stopped having periods with her surgery March of 2009. She does have "premenstrual symptoms" on a regular basis.  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 Her husband Brand Males works for a; [employer] [place]. Their children Jaden (9) and Bryson (4) at home.   ADVANCED DIRECTIVES: not in place  HEALTH MAINTENANCE: History  Substance Use Topics  . Smoking status: Never Smoker   . Smokeless tobacco: Not on file  . Alcohol Use: Yes      Colonoscopy: -  PAP: 09/2010  Bone density: -  Lipid panel:  Allergies  Allergen Reactions  . Codeine Itching  . Latex Swelling    Current Outpatient Prescriptions  Medication Sig Dispense Refill  . aspirin 81 MG chewable tablet Chew 81 mg by mouth daily.      . meclizine (ANTIVERT) 50 MG tablet Take 1 tablet (50 mg total) by mouth 3 (three) times daily as needed.  45 tablet  0  . mometasone (NASONEX) 50 MCG/ACT nasal spray Place 2 sprays into the nose daily.  17 g  12    OBJECTIVE: Young-appearing African American woman in no acute distress Filed Vitals:   06/28/12 0838  BP: 143/105  Pulse: 88  Temp: 98.2 F (36.8 C)  Resp: 20     Body mass index is 24.69 kg/(m^2).    ECOG FS: 0  Sclerae unicteric Oropharynx clear No cervical or supraclavicular adenopathy Lungs no rales or rhonchi Heart regular rate and rhythm Abd benign MSK no focal spinal tenderness, no peripheral edema Neuro: nonfocal Breasts: The right breast is status post recent biopsy. There is minimal ecchymosis. I do not palpate a well-defined mass, but there is some temporary biopsy change. The right axilla is clear. The left breast is unremarkable.   LAB RESULTS: Lab Results  Component Value Date   WBC 4.4 06/28/2012   NEUTROABS 2.6 06/28/2012   HGB 15.6 06/28/2012   HCT 44.1 06/28/2012   MCV 85.8 06/28/2012   PLT 231 06/28/2012      Chemistry      Component Value Date/Time   NA 141 06/28/2012 0819   NA 138 05/02/2012 1412   K 3.7 06/28/2012 0819   K 4.0 05/02/2012 1412   CL 106 06/28/2012 0819   CL 101 05/02/2012 1412   CO2 25 06/28/2012 0819   CO2 26 05/02/2012 1412   BUN 9.0 06/28/2012 0819   BUN 11 05/02/2012 1412   CREATININE 1.0 06/28/2012 0819   CREATININE 0.80 05/02/2012 1412   CREATININE 0.44 06/02/2008 0507      Component Value Date/Time   CALCIUM 9.4 06/28/2012 0819   CALCIUM 11.0* 05/02/2012 1412   ALKPHOS 61 06/28/2012 0819   ALKPHOS 69 05/02/2012 1412   AST 16 06/28/2012 0819    AST 16 05/02/2012 1412   ALT 7 06/28/2012 0819   ALT 11 05/02/2012 1412   BILITOT 0.46 06/28/2012 0819   BILITOT 0.5 05/02/2012 1412       No results found for this basename: LABCA2    No components found with this basename: LABCA125    No results found for this basename: INR:1;PROTIME:1 in the last 168 hours  Urinalysis No results found for this basename: colorurine,  appearanceur,  labspec,  phurine,  glucoseu,  hgbur,  bilirubinur,  ketonesur,  proteinur,  urobilinogen,  nitrite,  leukocytesur    STUDIES: US Breast Left  06/05/2012  *RADIOLOGY REPORT*  Clinical Data:  The patient was undergoing short-term interval follow-up of a 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  Annual reevaluation, right breast.  DIGITAL DIAGNOSTIC BILATERAL MAMMOGRAM WITH CAD AND LEFT BREAST ULTRASOUND:  Comparison:  No prior mammogram.  Left breast ultrasound 02/23/2008, 09/04/2008.  Findings:  ACR Breast Density Category 3: The breast tissue is heterogeneously dense.  CC and MLO views of both breasts and spot magnification views of the inner right breast were obtained.  Throughout the inner third of the right breast are pleomorphic calcifications, extending over at least a 9 cm length.  A circumscribed oval shaped mass in the lower inner left breast corresponds to the previously identified presumed fibroadenoma.  No suspicious calcifications in the left breast.  Mammographic images were processed with CAD.  On physical exam, I palpate a small freely mobile mass in the lower inner left breast.  No mass is palpated in the inner right breast.  Ultrasound is performed, showing a horizontally oriented hypoechoic mass with macrolobulation at the 7 o'clock position of the left breast, approximately 6 cm from the nipple, measuring approximately 1.3 x 0.4 x 1.2 cm, slightly smaller than on the prior examinations.   IMPRESSION:  1.  Highly suspicious calcifications involving most of the the inner third of the right breast extending over an at least 9 cm segment. 2.  Slight decrease in size of the benign fibroadenoma in the lower inner left breast since 2010.  RECOMMENDATION: Stereotactic needle biopsy of the right breast calcifications was discussed with the patient.  She agrees to proceed and this has been scheduled for Friday, January 3 at 8 o'clock a.m.  I showed the patient her mammograms and I have discussed the findings and recommendations with the patient.  She left before receiving written instructions, therefore a message was left on her cellular telephone with the appointment time.  BI-RADS CATEGORY 5:  Highly suggestive of malignancy - appropriate action should be taken.  These results were telephoned to Dr. Wynelle Link on 06/05/2012 at 0940 hours.   Original Report Authenticated By: Hulan Saas, M.D.    US Breast Right  06/16/2012  *RADIOLOGY REPORT*  Clinical Data:  The patient presents for biopsy of calcifications in the right breast.  Given the appearance, ultrasound performed to determine if ultrasound guidance is possible.  RIGHT BREAST ULTRASOUND  Comparison:  06/05/2012  On physical exam, I palpate a focal thickening in the 1 o'clock location of the right breast. This region is also visible when the patient lying supine.  Findings: Ultrasound is performed, showing multiple hypoechoic masses throughout the upper quadrants of the right breast, many of them associated with sonographically visible calcifications.  The visible and palpable mass in the 1 o'clock location of the right breast corresponds with an irregular hypoechoic mass on ultrasound which measures 1.7 x 1.4 cm.  A discrete hypoechoic lobulated mass is also identified in the 11 o'clock location of the right breast, measuring 2.1 x 1.1 x 2.1 cm.  A third lesion is identified in the 11 o'clock location of the right breast, 3 cm from the nipple,  measuring 1.0 x 0.8 x 0.9 cm.  Several masses show question of ductal extension.  The findings are suspicious for areas of invasive ductal carcinoma in a background of ductal carcinoma in situ, given the mammographic findings.  Of note, in the upper-outer quadrant of the right breast, there are no suspicious microcalcifications by mammography, raising question of more extensive disease than identified mammographically.  No axillary adenopathy identified by ultrasound.  IMPRESSION: Multiple suspicious masses in the  right breast.  Although some may be related to fibroadenomas, findings are suspicious for invasive ductal carcinoma given the mammographic abnormality of pleomorphic calcifications in the upper-inner quadrant.  RECOMMENDATION: Biopsy of the palpable mass in the 1 o'clock location of the right breast is recommended.  Depending on the results, additional biopsies may be necessary, and this is discussed with the patient. Biopsy is performed on the same day and is dictated separately.  I have discussed the findings and recommendations with the patient. Results were also provided in writing at the conclusion of the visit.  BI-RADS CATEGORY 5:  Highly suggestive of malignancy - appropriate action should be taken.   Original Report Authenticated By: Norva Pavlov, M.D.    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 In addition, there are solid masses in the lateral portion of the right breast and suspicious calcifications in the medial portion of the right breast.  BILATERAL BREAST MRI WITH AND WITHOUT CONTRAST  Technique: Multiplanar, multisequence MR images of both breasts were obtained prior to and following the intravenous administration of 13ml of Multihance.  Three dimensional images were evaluated at the independent DynaCad workstation.  Comparison:   [facility] 06/16/2012 and earlier  Findings: There is significant bilateral breast enhancement, limiting sensitivity and specificity for cancer detection.  Within the upper central portion of the right breast, there is an enhancing mass which shows washout type enhancement kinetics and measures 1.8 x 1.7 x 1.6 cm.  This is associated with tissue marker clip artifact from recent ultrasound guided core biopsy and is consistent with known malignancy.  Mass is irregular in shape. Additionally, within the upper inner quadrant of the right breast, there is clumped, nodular enhancement extending from the nipple towards the chest wall and measuring 11.4 (anterior-posterior) x 3.5 x 2.7 cm.  The findings are suspicious for significant area of ductal carcinoma in situ, also suspected on the mammogram.  The area of abnormal calcifications measures approximately 10 cm (anterior-posterior) mammographically.  Within the upper-outer quadrant of the right breast, there are three discrete irregular masses, showing persistent type enhancement kinetics and measuring 3.0, 1.5, and 2.1 cm.  These correlate well with the ultrasound findings of additional masses in the upper-outer quadrant of the right breast.  Differential diagnosis includes malignancy but fibroadenomas are favored.  Within the upper inner quadrant of the left breast, there is a large area of clumped, nodular enhancement which measures 8.9 (anterior-posterior) x 2.2 x 2.7 cm.  Findings are suspicious for ductal carcinoma in situ in the left breast.  Within the lower central portion of the left breast, there is a 1.7 cm circumscribed nodule showing persistent type enhancement kinetics.  This is consistent with known fibroadenoma in this location which has been stable over multiple previous exams.  No axillary or internal mammary adenopathy identified.  IMPRESSION:  1.  Findings consistent with known malignancy in the right  breast. There are mammogram and MR findings to suggest significant disease throughout the upper-outer quadrant of the right breast. 2.  There are three discrete masses within the upper outer quadrant of the right breast.  Biopsy can be performed if needed. 3.  Findings are suspicious for ductal carcinoma in situ involving the upper inner quadrant of the left breast.  RECOMMENDATION:  1.  MR guided core biopsy is  recommended of the upper inner quadrant of the left breast. 2.  If breast conservation is being considered for the right breast, additional right breast biopsies are recommended.  THREE-DIMENSIONAL MR IMAGE RENDERING ON INDEPENDENT WORKSTATION:  Three-dimensional MR images were rendered by post-processing of the original MR data on an independent workstation.  The three- dimensional MR images were interpreted, and findings were reported in the accompanying complete MRI report for this study.  BI-RADS CATEGORY 4:  Suspicious abnormality - biopsy should be considered.   Original Report Authenticated By: Norva Pavlov, M.D.    [place] Core Biopsy  06/16/2012  *RADIOLOGY REPORT*  Clinical Data:  Suspicious mass in the 1 o'clock location of the right breast.  ULTRASOUND GUIDED VACUUM ASSISTED CORE BIOPSY OF THE RIGHT BREAST  Comparison: Previous exams.  I met with the patient and we discussed the procedure of ultrasound- guided biopsy, including benefits and alternatives.  We discussed the high likelihood of a successful procedure. We discussed the risks of the procedure including infection, bleeding, tissue injury, clip migration, and inadequate sampling.  Informed written consent was given.  Using sterile technique, 2% lidocaine ultrasound guidance and a 12 gauge vacuum assisted needle biopsy was performed of mass in the 1 o'clock location of the right breast using a medial approach.  At the conclusion of the procedure, a ribbon shaped tissue marker clip was deployed into the biopsy cavity.  Follow-up 2-view  mammogram was performed and dictated separately.  IMPRESSION: Ultrasound-guided biopsy of right breast mass.  No apparent complications.   Original Report Authenticated By: Norva Pavlov, M.D.    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  Annual reevaluation, right breast.  DIGITAL DIAGNOSTIC BILATERAL MAMMOGRAM WITH CAD AND LEFT BREAST ULTRASOUND:  Comparison:  No prior mammogram.  Left breast ultrasound 02/23/2008, 09/04/2008.  Findings:  ACR Breast Density Category 3: The breast tissue is heterogeneously dense.  CC and MLO views of both breasts and spot magnification views of the inner right breast were obtained.  Throughout the inner third of the right breast are pleomorphic calcifications, extending over at least a 9 cm length.  A circumscribed oval shaped mass in the lower inner left breast corresponds to the previously identified presumed fibroadenoma.  No suspicious calcifications in the left breast.  Mammographic images were processed with CAD.  On physical exam, I palpate a small freely mobile mass in the lower inner left breast.  No mass is palpated in the inner right breast.  Ultrasound is performed, showing a horizontally oriented hypoechoic mass with macrolobulation at the 7 o'clock position of the left breast, approximately 6 cm from the nipple, measuring approximately 1.3 x 0.4 x 1.2 cm, slightly smaller than on the prior examinations.  IMPRESSION:  1.  Highly suspicious calcifications involving most of the the inner third of the right breast extending over an at least 9 cm segment. 2.  Slight decrease in size of the benign fibroadenoma in the lower inner left breast since 2010.  RECOMMENDATION: Stereotactic needle biopsy of the right breast calcifications  was discussed with the patient.  She agrees to proceed and this has been scheduled for Friday, January 3 at 8 o'clock a.m.  I showed the patient her mammograms and I have discussed the findings and recommendations  with the patient.  She left before receiving written instructions, therefore a message was left on her cellular telephone with the appointment time.  BI-RADS CATEGORY 5:  Highly suggestive of malignancy - appropriate action should be taken.  These results were telephoned to Dr. Wynelle Link on 06/05/2012 at 0940 hours.   Original Report Authenticated By: Hulan Saas, M.D.    Mm Digital Diagnostic Unilat R  06/16/2012  *RADIOLOGY REPORT*  Clinical Data:  Status post ultrasound guided core biopsy of mass in the 1 o'clock location of the right breast.  DIGITAL DIAGNOSTIC RIGHT MAMMOGRAM  Comparison:  Previous exams.  Findings:  Films are performed following ultrasound guided biopsy of mass in the 1 o'clock location of the right breast.  A ribbon shaped clip is identified in the upper inner quadrant of the right breast, as expected.  IMPRESSION: Tissue marker clip is in expected location after biopsy.   Original Report Authenticated By: Norva Pavlov, M.D.    Mm Radiologist Eval And Mgmt  06/19/2012  *RADIOLOGY REPORT*  ESTABLISHED PATIENT OFFICE VISIT - LEVEL II [phone])  Chief Complaint:  The patient returns following ultrasound-guided core biopsy of the right breast.  History:  Suspicious microcalcifications and masses in the right breast.  Ultrasound guided core biopsy was performed of mass in the 1 o'clock location of the left breast.  The  Exam:  Biopsy site is clean and dry.  No evidence for ecchymosis or hematoma.  Pathology: Breast, right, needle core biopsy, mass, 1 o'clock  - INVASIVE DUCTAL CARCINOMA.  - DUCTAL CARCINOMA IN SITU WITH CALCIFICATION.  Assessment and Plan:  Pathology correlates well with the imaging appearance.  I met with the patient and her friend.  We discussed the results and  recommendations of excision. We discussed the possibility of significant additional disease in the right breast. Patient will be seen at the multidisciplinary clinic on 06/28/2012. MRI is scheduled for 06/23/2012.   Original Report Authenticated By: Norva Pavlov, M.D.     ASSESSMENT: 42 y.o. [place] woman status post right breast biopsy 06/16/2012 showing an invasive ductal carcinoma, grade 2 or 3, estrogen receptor 100% and progesterone receptor 100% positive, with an MIB-1 of 48%, and HER-2 amplified by CISH with a ratio of 3.02.  PLAN: We spent the better part of her our-plus visit today going over the biology of her tumor. It is very difficult to know whether she is stage I or higher, whether she has extensive DCIS in that and the contralateral breast, or whether she is just having of breast changes due to her period. There is a left breast biopsy pending of course. Nevertheless our feeling this morning in conference was it would be reasonable for her to choose bilateral mastectomies and indeed she might need bilateral oophorectomies eventually, depending on her genetic testing results.  She understands in any case she will need chemotherapy with HER-2 treatment as per NCCN guidelines. Specifically, if she turns out to be stage 1 I would use 4 cycles of cyclophosphamide and docetaxel, and he she is stage 2 I would do 6 cycles of carboplatin and docetaxel. Either regimen will be given together with trastuzumab and the trastuzumab will be continued for 1 year. I oriented her to some of the possible toxicities, side effects, and complications of these treatments and she understands she will need some staging studies and in addition an echocardiogram before starting treatment.  She is going to see me in 3-4 weeks. By that time we will have all the results  in hand and should be able to draw up a definitive plan for her. MAGRINAT,GUSTAV C    06/28/2012

## 2012-06-28 NOTE — Telephone Encounter (Signed)
see me 4:30 PM Jan 30

## 2012-06-28 NOTE — Patient Instructions (Signed)
My office will call you for follow up.

## 2012-06-28 NOTE — Progress Notes (Signed)
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 female who is seen today at the [facility] for the courtesy of Dr. Luisa Hart for evaluation of her stage I (T1, N0, M0) invasive ductal/DCIS of the right breast. She tells that she's been followed for a possible left breast fibroadenoma which she was nursing her newborn approximately 3-4 years ago. She has not been imaged since 2010. Mammography on 06/05/2012 showed highly suspicious calcifications involving most of the inner third of the right breast extending over at least 9 cm. There is slight decrease in size of a benign fibroadenoma seen within the lower inner quadrant of the left breast. Right breast ultrasound on 06/16/2012 showed multiple suspicious masses within the right breast some of which may represent fibroadenomas but other suspicious for invasive ductal carcinoma. Right breast biopsy on 06/16/2012 was diagnostic for invasive ductal carcinoma along with DCIS with calcifications. The invasive tumor was grade 2-3 and found to be ER positive at 100%, PR +100% along with an elevated proliferation marker of 248%. I see that she was HER-2/neu positive, but do not have the report. Breast MR on 12/21/2012 showed a 1.8 x 1.7 x 1.6 cm mass along the upper central portion of the right breast along with clump and nodular enhancement extending from the nipple towards the chest wall measuring over a distance of 11.4 cm, anterior-posterior suspicious for DCIS. There were felt to be 3 discreet masses within the upper-outer quadrant of the right breast. There were also findings suspicious  for DCIS involving the upper inner quadrant of the left breast. There were no findings to suggest axillary or internal mammary lymph node metastases. She seen today with Dr. Luisa Hart and Dr. Darnelle Catalan.  PREVIOUS RADIATION THERAPY: No   PAST MEDICAL HISTORY:  has a past medical history of Allergy; Migraine; Migraine; and Breast cancer.     PAST SURGICAL HISTORY:  Past Surgical History  Procedure Date  . Abdominal hysterectomy     no salpingo-oophorectomy     FAMILY HISTORY: family history includes Alcohol abuse in her mother; Brain cancer in her paternal uncle; Colon cancer in her maternal aunt; Heart disease in her maternal grandmother; Hypertension in her mother; and Stroke in her maternal grandfather. Her father died in motor vehicle accident at age 74. Her mother is alive and well at 37. No family history of breast cancer.   SOCIAL HISTORY:  reports that she has never smoked. She does not have any smokeless tobacco history on file. She reports that she drinks alcohol. She reports that she does not use illicit drugs. Married, 2 his age is 4 and 59. 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   ALLERGIES: Codeine and Latex   MEDICATIONS:  Current Outpatient Prescriptions  Medication Sig Dispense Refill  . aspirin 81 MG chewable tablet Chew 81 mg by mouth daily.      . meclizine (ANTIVERT) 50 MG tablet Take 1 tablet (50 mg total) by mouth 3 (three) times daily as needed.  45 tablet  0  . mometasone (NASONEX) 50  MCG/ACT nasal spray Place 2 sprays into the nose daily.  17 g  12     REVIEW OF SYSTEMS:  Pertinent items are noted in HPI.    PHYSICAL EXAM: Alert and oriented 42 year old after American female appearing younger than her stated age. Wt Readings from Last 3 Encounters:  06/28/12 137 lb 3.2 oz (62.234 kg)  05/29/12 137 lb 6.4 oz (62.324 kg)  05/04/12 137 lb 12.8 oz (62.506 kg)   Temp Readings from Last 3 Encounters:  06/28/12 98.2 F (36.8 C)   05/29/12 98 F  (36.7 C) Oral  05/04/12 98.4 F (36.9 C) Oral   BP Readings from Last 3 Encounters:  06/28/12 143/105  05/29/12 126/87  05/04/12 127/83   Pulse Readings from Last 3 Encounters:  06/28/12 88  05/29/12 73  05/04/12 96   Head and neck examination: Grossly unremarkable. Nodes: Without palpable cervical, supraclavicular, or axillary lymphadenopathy. I can palpate a few 0.5 -0.7 cm nodes within the right axilla. These are not felt to be pathologically enlarged. Chest: Lungs clear. Breasts: There is area of thickening along the superior aspect of the right breast at approximately 12:00 over an area of 3-4 cm is. There is a biopsy wound at approximately 2:00. Left breast without masses or lesions. Heart: Regular in rhythm. Back: Without spinal or CVA tenderness to palpation. Abdomen: Without masses organomegaly. Extremities: Without edema. Neurologic examination: Grossly nonfocal.    LABORATORY DATA:  Lab Results  Component Value Date   WBC 4.4 06/28/2012   HGB 15.6 06/28/2012   HCT 44.1 06/28/2012   MCV 85.8 06/28/2012   PLT 231 06/28/2012   Lab Results  Component Value Date   NA 141 06/28/2012   K 3.7 06/28/2012   CL 106 06/28/2012   CO2 25 06/28/2012   Lab Results  Component Value Date   ALT 7 06/28/2012   AST 16 06/28/2012   ALKPHOS 61 06/28/2012   BILITOT 0.46 06/28/2012      IMPRESSION: Clinical stage I (T1, N0, M0) invasive ductal/extensive DCIS of the right breast. 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 She is scheduled for MRI guided biopsy of her left breast this Friday. In view of the apparent extensive DCIS involvement of her right breast, feel that she would best suited for mastectomy. She would also needs a lymph node biopsy. If she's had had extensive DCIS of the left breast that she would require bilateral mastectomies. We discussed the role of post mastectomy radiation therapy with respect to her right breast. This will depend on her pathologic stage. I would favor a delayed  reconstruction of the right following her mastectomy. We discussed the potential acute and late toxicities of radiation therapy should she require post mastectomy radiation therapy. She will be represented following her definitive surgery.   PLAN: As discussed above.   I spent 40 minutes minutes face to face with the patient and more than 50% of that time was spent in counseling and/or coordination of care.

## 2012-06-28 NOTE — Progress Notes (Signed)
Patient ID: Sherry Barry, female   DOB: [birth year], 42 y.o.   MRN: [id number]  No chief complaint on file.   HPI Sherry Barry is a 42 y.o. female.  Pt sent at request of Dr Deboraha Sprang for right breast cancer.  She has had no symptoms.  Family history of pancreatic cancer but no breast cancer.  No mass or discharge. HPI  Past Medical History  Diagnosis Date  . Allergy   . Migraine   . Migraine   . Breast cancer     Past Surgical History  Procedure Date  . Abdominal hysterectomy     Family History  Problem Relation Age of Onset  . Hypertension Mother   . Alcohol abuse Mother   . Heart disease Maternal Grandmother   . Stroke Maternal Grandfather   . Colon cancer Maternal Aunt   . Brain cancer Paternal Uncle     Social History History  Substance Use Topics  . Smoking status: Never Smoker   . Smokeless tobacco: Not on file  . Alcohol Use: Yes    Allergies  Allergen Reactions  . Codeine Itching  . Latex Swelling    Current Outpatient Prescriptions  Medication Sig Dispense Refill  . aspirin 81 MG chewable tablet Chew 81 mg by mouth daily.      . meclizine (ANTIVERT) 50 MG tablet Take 1 tablet (50 mg total) by mouth 3 (three) times daily as needed.  45 tablet  0  . mometasone (NASONEX) 50 MCG/ACT nasal spray Place 2 sprays into the nose daily.  17 g  12    Review of Systems Review of Systems  Constitutional: Negative for fever, chills and unexpected weight change.  HENT: Negative for hearing loss, congestion, sore throat, trouble swallowing and voice change.   Eyes: Negative for visual disturbance.  Respiratory: Negative for cough and wheezing.   Cardiovascular: Negative for chest pain, palpitations and leg swelling.  Gastrointestinal: Negative for nausea, vomiting, abdominal pain, diarrhea, constipation, blood in stool, abdominal distention and anal bleeding.  Genitourinary: Negative for hematuria, vaginal bleeding and difficulty urinating.    Musculoskeletal: Negative for arthralgias.  Skin: Negative for rash and wound.  Neurological: Negative for seizures, syncope and headaches.  Hematological: Negative for adenopathy. Does not bruise/bleed easily.  Psychiatric/Behavioral: Negative for confusion.    There were no vitals taken for this visit.  Physical Exam Physical Exam  Constitutional: She is oriented to person, place, and time. She appears well-developed and well-nourished.  HENT:  Head: Normocephalic and atraumatic.  Eyes: EOM are normal. Pupils are equal, round, and reactive to light.  Neck: Normal range of motion. Neck supple.  Cardiovascular: Normal rate and regular rhythm.   Pulmonary/Chest: Effort normal and breath sounds normal. Right breast exhibits mass. Right breast exhibits no inverted nipple, no nipple discharge, no skin change and no tenderness. Left breast exhibits no inverted nipple, no mass, no nipple discharge, no skin change and no tenderness. Breasts are symmetrical.    Musculoskeletal: Normal range of motion.  Neurological: She is alert and oriented to person, place, and time.  Skin: Skin is warm and dry.  Psychiatric: She has a normal mood and affect. Her behavior is normal. Judgment and thought content normal.    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  In addition, there are solid masses in the lateral portion of the  right breast and suspicious calcifications in the  medial portion of  the right breast.  BILATERAL BREAST MRI WITH AND WITHOUT CONTRAST  Technique: Multiplanar, multisequence MR images of both breasts  were obtained prior to and following the intravenous administration  of 13ml of Multihance. Three dimensional images were evaluated at  the independent DynaCad workstation.  Comparison: Prior studies from the [facility]  Imaging 06/16/2012 and earlier   Findings: There is significant bilateral breast enhancement,  limiting sensitivity and specificity for cancer detection.  Within the upper central portion of the right breast, there is an  enhancing mass which shows washout type enhancement kinetics and  measures 1.8 x 1.7 x 1.6 cm. This is associated with tissue marker  clip artifact from recent ultrasound guided core biopsy and is  consistent with known malignancy. Mass is irregular in shape.  Additionally, within the upper inner quadrant of the right breast,  there is clumped, nodular enhancement extending from the nipple  towards the chest wall and measuring 11.4 (anterior-posterior) x  3.5 x 2.7 cm. The findings are suspicious for significant area of  ductal carcinoma in situ, also suspected on the mammogram. The  area of abnormal calcifications measures approximately 10 cm  (anterior-posterior) mammographically. Within the upper-outer  quadrant of the right breast, there are three discrete irregular  masses, showing persistent type enhancement kinetics and measuring  3.0, 1.5, and 2.1 cm. These correlate well with the ultrasound  findings of additional masses in the upper-outer quadrant of the  right breast. Differential diagnosis includes malignancy but  fibroadenomas are favored.  Within the upper inner quadrant of the left breast, there is a  large area of clumped, nodular enhancement which measures 8.9  (anterior-posterior) x 2.2 x 2.7 cm. 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 Within the lower  central portion of the left breast, there is a 1.7 cm circumscribed  nodule showing persistent type enhancement kinetics. 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  No axillary or internal mammary adenopathy identified.  IMPRESSION:  1. Findings consistent with known malignancy in the right breast.  There are mammogram and MR  findings to suggest significant disease  throughout the upper-outer quadrant of the right breast.  2. There are three discrete masses within the upper outer quadrant  of the right breast. Biopsy can be performed if needed.  3. 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  RECOMMENDATION:  1. MR guided core biopsy is recommended of the upper inner  quadrant of the left breast.  2. If breast conservation is being considered for the right  breast, additional right breast biopsies are recommended.  THREE-DIMENSIONAL MR IMAGE RENDERING ON INDEPENDENT WORKSTATION:  Three-dimensional MR images were rendered by post-processing of the  original MR data on an independent workstation. 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 Await left breast biopsy Needs port\genetics See after biopsy to schedule surgery Hold on reconstruction       Sherry Barry A. 06/28/2012, 9:36 AM

## 2012-06-29 ENCOUNTER — Encounter: Payer: Self-pay | Admitting: *Deleted

## 2012-06-29 ENCOUNTER — Other Ambulatory Visit: Payer: Self-pay | Admitting: *Deleted

## 2012-06-29 ENCOUNTER — Telehealth: Payer: Self-pay | Admitting: *Deleted

## 2012-06-29 DIAGNOSIS — C50219 Malignant neoplasm of upper-inner quadrant of unspecified female breast: Secondary | ICD-10-CM

## 2012-06-29 NOTE — Telephone Encounter (Signed)
Left voice message to inform the patient of the new date and time of all the appointments

## 2012-06-29 NOTE — Progress Notes (Signed)
[facility] Psychosocial Distress Screening Clinical Social Work  Patient completed distress screening protocol, and scored a 5 on the Psychosocial Distress Thermometer which indicates moderate distress. Clinical Child psychotherapist met with pt and pt's husband in [facility] to offer support and assess for distress and other psychosocial needs.  Pt stated that although she was still experiencing some anxiety she felt much better after meeting with the physicians.  CSW informed pt of the support team and support services at [facility].  CSW and pt also discussed resources for her 2 young sons, and pt was agreeable to a reach to recovery referral.   CSW encouraged her to call with any questions or concerns.     Tamala Julian, MSW, LCSW Clinical Social Worker [facility] [phone]

## 2012-06-30 ENCOUNTER — Ambulatory Visit
Admission: RE | Admit: 2012-06-30 | Discharge: 2012-06-30 | Disposition: A | Discharge status: Home / Self Care | Payer: BC Managed Care – PPO | Source: Ambulatory Visit | Attending: Family Medicine | Admitting: Family Medicine

## 2012-06-30 ENCOUNTER — Ambulatory Visit: Admission: RE | Admit: 2012-06-30 | Payer: Federal, State, Local not specified - PPO | Source: Ambulatory Visit

## 2012-06-30 DIAGNOSIS — R928 Other abnormal and inconclusive findings on diagnostic imaging of breast: Secondary | ICD-10-CM

## 2012-06-30 MED ORDER — GADOBENATE DIMEGLUMINE 529 MG/ML IV SOLN
13.0000 mL | Freq: Once | INTRAVENOUS | Status: AC | PRN
  Administered 2012-06-30: 13 mL via INTRAVENOUS

## 2012-07-04 ENCOUNTER — Other Ambulatory Visit: Payer: Self-pay | Admitting: Oncology

## 2012-07-05 ENCOUNTER — Encounter: Payer: Self-pay | Admitting: *Deleted

## 2012-07-05 ENCOUNTER — Other Ambulatory Visit: Payer: BC Managed Care – PPO

## 2012-07-07 ENCOUNTER — Ambulatory Visit (HOSPITAL_COMMUNITY)
Admission: RE | Admit: 2012-07-07 | Discharge: 2012-07-07 | Disposition: A | Discharge status: Home / Self Care | Payer: BC Managed Care – PPO | Source: Ambulatory Visit | Attending: Oncology | Admitting: Oncology

## 2012-07-07 ENCOUNTER — Encounter (HOSPITAL_COMMUNITY): Payer: Self-pay

## 2012-07-07 DIAGNOSIS — C50219 Malignant neoplasm of upper-inner quadrant of unspecified female breast: Secondary | ICD-10-CM

## 2012-07-07 DIAGNOSIS — N649 Disorder of breast, unspecified: Secondary | ICD-10-CM | POA: Insufficient documentation

## 2012-07-07 LAB — GLUCOSE, CAPILLARY: Glucose-Capillary: 69 mg/dL — ABNORMAL LOW (ref 70–99)

## 2012-07-07 MED ORDER — IOHEXOL 300 MG/ML  SOLN
80.0000 mL | Freq: Once | INTRAMUSCULAR | Status: AC | PRN
  Administered 2012-07-07: 80 mL via INTRAVENOUS

## 2012-07-07 MED ORDER — FLUDEOXYGLUCOSE F - 18 (FDG) INJECTION
19.0000 | Freq: Once | INTRAVENOUS | Status: AC | PRN
  Administered 2012-07-07: 19 via INTRAVENOUS

## 2012-07-10 ENCOUNTER — Telehealth: Payer: Self-pay | Admitting: *Deleted

## 2012-07-10 ENCOUNTER — Encounter: Payer: Self-pay | Admitting: *Deleted

## 2012-07-10 ENCOUNTER — Encounter (INDEPENDENT_AMBULATORY_CARE_PROVIDER_SITE_OTHER): Payer: Self-pay | Admitting: Surgery

## 2012-07-10 ENCOUNTER — Ambulatory Visit (INDEPENDENT_AMBULATORY_CARE_PROVIDER_SITE_OTHER): Payer: BC Managed Care – PPO | Admitting: Surgery

## 2012-07-10 ENCOUNTER — Encounter (HOSPITAL_COMMUNITY): Payer: Self-pay | Admitting: Pharmacy Technician

## 2012-07-10 VITALS — BP 117/62 | HR 66 | Temp 98.2°F | Resp 12 | Ht 62.5 in | Wt 139.2 lb

## 2012-07-10 DIAGNOSIS — C50919 Malignant neoplasm of unspecified site of unspecified female breast: Secondary | ICD-10-CM

## 2012-07-10 NOTE — Progress Notes (Signed)
Patient ID: Sherry Barry, female   DOB: Oct 01, 1970, 42 y.o.   MRN: [id number]  Chief Complaint  Patient presents with  . Follow-up    HPI Sherry Barry is a 42 y.o. female.  Pt sent at request of Dr Deboraha Sprang for right breast cancer.  She has had no symptoms.  Family history of pancreatic cancer but no breast cancer.  No mass or discharge. HPI Left biopsy showed fibrocystic change. Past Medical History  Diagnosis Date  . Allergy   . Migraine   . Migraine   . Breast cancer     Past Surgical History  Procedure Date  . Abdominal hysterectomy     no salpingo-oophorectomy    Family History  Problem Relation Age of Onset  . Hypertension Mother   . Alcohol abuse Mother   . Heart disease Maternal Grandmother   . Stroke Maternal Grandfather   . Colon cancer Maternal Aunt   . Brain cancer Paternal Uncle     Social History History  Substance Use Topics  . Smoking status: Never Smoker   . Smokeless tobacco: Not on file  . Alcohol Use: Yes    Allergies  Allergen Reactions  . Codeine Itching  . Latex Swelling    Current Outpatient Prescriptions  Medication Sig Dispense Refill  . aspirin 81 MG chewable tablet Chew 81 mg by mouth daily.      . meclizine (ANTIVERT) 50 MG tablet Take 1 tablet (50 mg total) by mouth 3 (three) times daily as needed.  45 tablet  0  . mometasone (NASONEX) 50 MCG/ACT nasal spray Place 2 sprays into the nose daily.  17 g  12    Review of Systems Review of Systems  Constitutional: Negative for fever, chills and unexpected weight change.  HENT: Negative for hearing loss, congestion, sore throat, trouble swallowing and voice change.   Eyes: Negative for visual disturbance.  Respiratory: Negative for cough and wheezing.   Cardiovascular: Negative for chest pain, palpitations and leg swelling.  Gastrointestinal: Negative for nausea, vomiting, abdominal pain, diarrhea, constipation, blood in stool, abdominal distention and anal  bleeding.  Genitourinary: Negative for hematuria, vaginal bleeding and difficulty urinating.  Musculoskeletal: Negative for arthralgias.  Skin: Negative for rash and wound.  Neurological: Negative for seizures, syncope and headaches.  Hematological: Negative for adenopathy. Does not bruise/bleed easily.  Psychiatric/Behavioral: Negative for confusion.    Blood pressure 117/62, pulse 66, temperature 98.2 F (36.8 C), temperature source Temporal, resp. rate 12, height 5' 2.5" (1.588 m), weight 139 lb 3.2 oz (63.141 kg).  Physical Exam Physical Exam  Constitutional: She is oriented to person, place, and time. She appears well-developed and well-nourished.  HENT:  Head: Normocephalic and atraumatic.  Eyes: EOM are normal. Pupils are equal, round, and reactive to light.  Neck: Normal range of motion. Neck supple.  Cardiovascular: Normal rate and regular rhythm.   Pulmonary/Chest: Effort normal and breath sounds normal. Right breast exhibits mass. Right breast exhibits no inverted nipple, no nipple discharge, no skin change and no tenderness. Left breast exhibits no inverted nipple, no mass, no nipple discharge, no skin change and no tenderness. Breasts are symmetrical.    Musculoskeletal: Normal range of motion.  Neurological: She is alert and oriented to person, place, and time.  Skin: Skin is warm and dry.  Psychiatric: She has a normal mood and affect. Her behavior is normal. Judgment and thought content normal.    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  In addition, there are solid masses in the lateral portion of the  right breast and suspicious calcifications in the medial portion of  the right breast.  BILATERAL BREAST MRI WITH AND WITHOUT CONTRAST  Technique: Multiplanar, multisequence MR images of both breasts  were obtained prior to and following the intravenous  administration  of 13ml of Multihance. Three dimensional images were evaluated at  the independent DynaCad workstation.  Comparison: Prior studies from the [facility] of [facility]  Imaging 06/16/2012 and earlier  Findings: There is significant bilateral breast enhancement,  limiting sensitivity and specificity for cancer detection.  Within the upper central portion of the right breast, there is an  enhancing mass which shows washout type enhancement kinetics and  measures 1.8 x 1.7 x 1.6 cm. This is associated with tissue marker  clip artifact from recent ultrasound guided core biopsy and is  consistent with known malignancy. Mass is irregular in shape.  Additionally, within the upper inner quadrant of the right breast,  there is clumped, nodular enhancement extending from the nipple  towards the chest wall and measuring 11.4 (anterior-posterior) x  3.5 x 2.7 cm. The findings are suspicious for significant area of  ductal carcinoma in situ, also suspected on the mammogram. The  area of abnormal calcifications measures approximately 10 cm  (anterior-posterior) mammographically. Within the upper-outer  quadrant of the right breast, there are three discrete irregular  masses, showing persistent type enhancement kinetics and measuring  3.0, 1.5, and 2.1 cm. These correlate well with the ultrasound  findings of additional masses in the upper-outer quadrant of the  right breast. Differential diagnosis includes malignancy but  fibroadenomas are favored.  Within the upper inner quadrant of the left breast, there is a  large area of clumped, nodular enhancement which measures 8.9  (anterior-posterior) x 2.2 x 2.7 cm. 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 Within the lower  central portion of the left breast, there is a 1.7 cm circumscribed  nodule showing persistent type enhancement kinetics. This is  consistent with known fibroadenoma in this location  which has been  stable over multiple previous exams.  No axillary or internal mammary adenopathy identified.  IMPRESSION:  1. Findings consistent with known malignancy in the right breast.  There are mammogram and MR findings to suggest significant disease  throughout the upper-outer quadrant of the right breast.  2. There are three discrete masses within the upper outer quadrant  of the right breast. Biopsy can be performed if needed.  3. 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  RECOMMENDATION:  1. MR guided core biopsy is recommended of the upper inner  quadrant of the left breast.  2. If breast conservation is being considered for the right  breast, additional right breast biopsies are recommended.  THREE-DIMENSIONAL MR IMAGE RENDERING ON INDEPENDENT WORKSTATION:  Three-dimensional MR images were rendered by post-processing of the  original MR data on an independent workstation. The three   Assessment    Stage 1 right breast cancer Right breast DCIS Left breast density diffuse concerning for DCIS    Plan    Will need Right simple mastectomy and right SLN biopsy,  Left simple mastectomy and port placement. Left breast biopsy shows fibrocystic change but given the appearance on MRI and the difficulty following this breast throughout her life I  feel left simple mastectomy warded.  Place Port-A-Cath. Risk of bleeding, infection, pneumothorax, hemothorax, injury to mediastinal structures, death, other procedures to repair damage if happens, all risk of Port-A-Cath placement.       Rica Heather A. 07/10/2012, 11:04 AM

## 2012-07-10 NOTE — Patient Instructions (Addendum)
Sentinel Lymph Node Analysis in Breast Cancer Treatment WHAT IS A LYMPH NODE? Lymph nodes are little glands that lie along the lymph channels and serve to trap infections in the body. These are the small vessel-like structures that carry the fluid (lymph) from body tissues away to be filtered. These are the "glands" that feel swollen in the neck when you or your child has a sore throat. These glands in your armpit are where breast cancer tends to spread first. WHAT IS SENTINEL LYMPH NODE ANALYSIS? Sentinel lymph node study is a new cancer diagnostic procedure. It aims to look at the most likely first spread of breast cancer. It involves looking at the lymph node or nodes where breast cancer is likely to travel next.  PROCEDURE  A small amount of blue dye and radioactive tracer are injected around the tumor in the breast. The dye and tracer follow the same path that a spreading cancer would be likely to follow. With the use of a Conservation officer, nature, the radioactive tracer can be located in the lymph node that is the gatekeeper to other lymph nodes in the armpit. The sentinel lymph node is the first lymph node in a chain of lymph nodes that drain the lymph from the breast. The blue dye enables the surgeon to identify this sentinel node. This node can be removed and examined. If no cancer is found in this node, no further removal of lymph nodes is recommended. In this case, the spread of cancer to the other lymph nodes is rare. This eliminates any more surgery in the armpit and risk of complications. If the lymph node shows spread of the cancer from the breast, the other lymph nodes in the armpit are removed and analyzed. This helps the doctor and patient decide how much more surgery is needed and if chemotherapy and/or radiation treatment is necessary following the surgery.  BENEFITS  The pathology tests for this procedure are much more sensitive than was previously available.  This technique is thought to be a  major improvement in the treatment of breast cancer.  This procedure allows many patients to avoid the effects of more extensive underarm lymph node removal and risk of complications (infection, bleeding, or severe arm swelling).  Survival rates from breast cancer are better and the risk of complications isreduced. Document Released: 05/31/2005 Document Revised: 08/23/2011 Document Reviewed: 02/14/2007 [facility] Patient Information 2013 [place], [place]. Mastectomy, With or Without Reconstruction Mastectomy (removal of the breast) is a procedure most commonly used to treat cancer (tumor) of the breast. Different procedures are available for treatment. This depends on the stage of the tumor (abnormal growths). Discuss this with your caregiver, surgeon (a specialist for performing operations such as this), or oncologist (someone specialized in the treatment of cancer). With proper information, you can decide which treatment is best for you. Although the sound of the word cancer is frightening to all of [place], the new treatments and medications can be a source of reassurance and comfort. If there are things you are worried about, discuss them with your caregiver. He or she can help comfort you and your family. Some of the different procedures for treating breast cancer are:  Radical (extensive) mastectomy. This is an operation used to remove the entire breast, the muscles under the breast, and all of the glands (lymph nodes) under the arm. With all of the new treatments available for cancer of the breast, this procedure has become less common.  Modified radical mastectomy. This is a similar  operation to the radical mastectomy described above. In the modified radical mastectomy, the muscles of the chest wall are not removed unless one of the lessor muscles is removed. One of the lessor muscles may be removed to allow better removal of the lymph nodes. The axillary lymph nodes are also removed. Rarely, during an  axillary node dissection nerves to this area are damaged. Radiation therapy is then often used to the area following this surgery.  A total mastectomy also known as a complete or simple mastectomy. It involves removal of only the breast. The lymph nodes and the muscles are left in place.  In a lumpectomy, the lump is removed from the breast. This is the simplest form of surgical treatment. A sentinel lymph node biopsy may also be done. Additional treatment may be required. RISKS AND COMPLICATIONS The main problems that follow removal of the breast include:  Infection (germs start growing in the wound). This can usually be treated with antibiotics (medications that kill germs).  Lymphedema. This means the arm on the side of the breast that was operated on swells because the lymph (tissue fluid) cannot follow the main channels back into the body. This only occurs when the lymph nodes have had to be removed under the arm.  There may be some areas of numbness to the upper arm and around the incision (cut by the surgeon) in the breast. This happens because of the cutting of or damage to some of the nerves in the area. This is most often unavoidable.  There may be difficultymoving the arm in a full range of motion (moving in all directions) following surgery. This usually improves with time following use and exercise.  Recurrence of breast cancer may happen with the very best of surgery and follow up treatment. Sometimes small cancer cells that cannot be seen with the naked eye have already spread at the time of surgery. When this happens other treatment is available. This treatment may be radiation, medications or a combination of both. RECONSTRUCTION Reconstruction of the breast may be done immediately if there is not going to be post-operative radiation. This surgery is done for cosmetic (improve appearance) purposes to improve the physical appearance after the operation. This may be done in two  ways:  It can be done using a saline filled prosthetic (an artificial breast which is filled with salt water). Silicone breast implants are now re-approved by the FDA and are being commonly used.  Reconstruction can be done using the body's own muscle/fat/skin. Your caregiver will discuss your options with you. Depending upon your needs or choice, together you will be able to determine which procedure is best for you. Document Released: 02/23/2001 Document Revised: 08/23/2011 Document Reviewed: 10/17/2007 [facility] Patient Information 2013 [place], [place].

## 2012-07-10 NOTE — Telephone Encounter (Signed)
Left vm for pt to return call concerning f/u appt.

## 2012-07-10 NOTE — Addendum Note (Signed)
Addended by: Silvestre Gunner on: 07/10/2012 04:47 PM   Modules accepted: Orders

## 2012-07-11 ENCOUNTER — Encounter: Payer: Self-pay | Admitting: Oncology

## 2012-07-11 ENCOUNTER — Telehealth: Payer: Self-pay | Admitting: Oncology

## 2012-07-11 NOTE — Telephone Encounter (Signed)
lmonvm adviisng the pt of her cancelled jan appt that has moved to feb 2014 due to her surgery date

## 2012-07-11 NOTE — Progress Notes (Signed)
CORRECTION:::: Faxed fmla form to [phone].

## 2012-07-11 NOTE — Progress Notes (Signed)
Faxed fmla form to [facility] @ [phone]. °

## 2012-07-13 ENCOUNTER — Ambulatory Visit: Payer: BC Managed Care – PPO | Admitting: Oncology

## 2012-07-13 ENCOUNTER — Encounter (HOSPITAL_COMMUNITY): Payer: Self-pay

## 2012-07-13 ENCOUNTER — Encounter (HOSPITAL_COMMUNITY)
Admission: RE | Admit: 2012-07-13 | Discharge: 2012-07-13 | Disposition: A | Discharge status: Home / Self Care | Payer: BC Managed Care – PPO | Source: Ambulatory Visit | Attending: Surgery | Admitting: Surgery

## 2012-07-13 ENCOUNTER — Ambulatory Visit (HOSPITAL_COMMUNITY)
Admission: RE | Admit: 2012-07-13 | Discharge: 2012-07-13 | Disposition: A | Discharge status: Home / Self Care | Payer: BC Managed Care – PPO | Source: Ambulatory Visit | Attending: Surgery | Admitting: Surgery

## 2012-07-13 VITALS — BP 143/101 | HR 112 | Temp 98.5°F | Resp 20 | Ht 62.5 in | Wt 139.0 lb

## 2012-07-13 DIAGNOSIS — C50919 Malignant neoplasm of unspecified site of unspecified female breast: Secondary | ICD-10-CM

## 2012-07-13 LAB — COMPREHENSIVE METABOLIC PANEL
ALT: 8 U/L (ref 0–35)
Alkaline Phosphatase: 57 U/L (ref 39–117)
BUN: 9 mg/dL (ref 6–23)
Chloride: 105 mEq/L (ref 96–112)
GFR calc Af Amer: >90 mL/min (ref >90)
Glucose, Bld: 75 mg/dL (ref 70–99)
Potassium: 3.4 mEq/L — ABNORMAL LOW (ref 3.5–5.1)
Sodium: 141 mEq/L (ref 135–145)
Total Bilirubin: 0.3 mg/dL (ref 0.3–1.2)
Total Protein: 7.5 g/dL (ref 6.0–8.3)

## 2012-07-13 LAB — CBC WITH DIFFERENTIAL/PLATELET
Eosinophils Absolute: 0.1 10*3/uL (ref 0.0–0.7)
Hemoglobin: 14.5 g/dL (ref 12.0–15.0)
Lymphocytes Relative: 25 % (ref 12–46)
Lymphs Abs: 1.9 10*3/uL (ref 0.7–4.0)
Monocytes Relative: 5 % (ref 3–12)
Neutro Abs: 5.3 10*3/uL (ref 1.7–7.7)
Neutrophils Relative %: 69 % (ref 43–77)
Platelets: 253 10*3/uL (ref 150–400)
RBC: 4.85 MIL/uL (ref 3.87–5.11)
WBC: 7.8 10*3/uL (ref 4.0–10.5)

## 2012-07-13 LAB — SURGICAL PCR SCREEN: MRSA, PCR: NEGATIVE

## 2012-07-13 MED ORDER — CHLORHEXIDINE GLUCONATE 4 % EX LIQD: 1.0000 "application " | Freq: Once | CUTANEOUS | Status: DC

## 2012-07-13 MED ORDER — CEFAZOLIN SODIUM-DEXTROSE 2-3 GM-% IV SOLR
2.0000 g | INTRAVENOUS | Status: AC
  Administered 2012-07-14: 2 g via INTRAVENOUS
  Filled 2012-07-13: qty 50

## 2012-07-14 ENCOUNTER — Encounter (HOSPITAL_COMMUNITY)
Admission: RE | Admit: 2012-07-14 | Discharge: 2012-07-14 | Disposition: A | Discharge status: Home / Self Care | Payer: BC Managed Care – PPO | Source: Ambulatory Visit | Attending: Surgery | Admitting: Surgery

## 2012-07-14 ENCOUNTER — Encounter (HOSPITAL_COMMUNITY)
Admission: RE | Disposition: A | Discharge status: Home / Self Care | Payer: Self-pay | Source: Ambulatory Visit | Attending: Surgery

## 2012-07-14 ENCOUNTER — Ambulatory Visit (HOSPITAL_COMMUNITY)
Admission: RE | Admit: 2012-07-14 | Discharge: 2012-07-16 | Disposition: A | Discharge status: Home / Self Care | Payer: BC Managed Care – PPO | Source: Ambulatory Visit | Attending: Surgery | Admitting: Surgery

## 2012-07-14 ENCOUNTER — Encounter (HOSPITAL_COMMUNITY): Payer: Self-pay | Admitting: Anesthesiology

## 2012-07-14 ENCOUNTER — Observation Stay (HOSPITAL_COMMUNITY): Payer: BC Managed Care – PPO

## 2012-07-14 ENCOUNTER — Encounter (HOSPITAL_COMMUNITY): Payer: Self-pay | Admitting: *Deleted

## 2012-07-14 ENCOUNTER — Ambulatory Visit (HOSPITAL_COMMUNITY): Payer: BC Managed Care – PPO | Admitting: Anesthesiology

## 2012-07-14 DIAGNOSIS — D249 Benign neoplasm of unspecified breast: Secondary | ICD-10-CM

## 2012-07-14 DIAGNOSIS — I10 Essential (primary) hypertension: Secondary | ICD-10-CM | POA: Insufficient documentation

## 2012-07-14 DIAGNOSIS — Z01812 Encounter for preprocedural laboratory examination: Secondary | ICD-10-CM | POA: Insufficient documentation

## 2012-07-14 DIAGNOSIS — C50919 Malignant neoplasm of unspecified site of unspecified female breast: Secondary | ICD-10-CM

## 2012-07-14 DIAGNOSIS — D0590 Unspecified type of carcinoma in situ of unspecified breast: Secondary | ICD-10-CM

## 2012-07-14 DIAGNOSIS — C773 Secondary and unspecified malignant neoplasm of axilla and upper limb lymph nodes: Secondary | ICD-10-CM | POA: Insufficient documentation

## 2012-07-14 DIAGNOSIS — N6019 Diffuse cystic mastopathy of unspecified breast: Secondary | ICD-10-CM

## 2012-07-14 HISTORY — PX: PORTACATH PLACEMENT: SHX2246

## 2012-07-14 HISTORY — PX: SIMPLE MASTECTOMY WITH AXILLARY SENTINEL NODE BIOPSY: SHX6098

## 2012-07-14 HISTORY — DX: Personal history of other diseases of the nervous system and sense organs: Z86.69

## 2012-07-14 LAB — CBC
Hemoglobin: 12.8 g/dL (ref 12.0–15.0)
MCH: 29.7 pg (ref 26.0–34.0)
MCHC: 34.9 g/dL (ref 30.0–36.0)
Platelets: 235 10*3/uL (ref 150–400)

## 2012-07-14 LAB — CREATININE, SERUM
Creatinine, Ser: 0.64 mg/dL (ref 0.50–1.10)
GFR calc non Af Amer: >90 mL/min (ref >90)

## 2012-07-14 SURGERY — SIMPLE MASTECTOMY WITH AXILLARY SENTINEL NODE BIOPSY
Anesthesia: General | Site: Chest | Laterality: Right | Wound class: Clean

## 2012-07-14 MED ORDER — DEXTROSE 5 % IV SOLN: 3.0000 g | INTRAVENOUS | Status: DC

## 2012-07-14 MED ORDER — OXYCODONE HCL 5 MG/5ML PO SOLN: 5.0000 mg | Freq: Once | ORAL | Status: DC | PRN

## 2012-07-14 MED ORDER — FENTANYL CITRATE 0.05 MG/ML IJ SOLN
INTRAMUSCULAR | Status: AC
  Administered 2012-07-14: 50 ug via INTRAVENOUS
  Filled 2012-07-14: qty 2

## 2012-07-14 MED ORDER — MIDAZOLAM HCL 2 MG/2ML IJ SOLN
INTRAMUSCULAR | Status: AC
  Administered 2012-07-14: 1 mg via INTRAVENOUS
  Filled 2012-07-14: qty 2

## 2012-07-14 MED ORDER — LACTATED RINGERS IV SOLN
INTRAVENOUS | Status: DC | PRN
  Administered 2012-07-14: 13:00:00 via INTRAVENOUS

## 2012-07-14 MED ORDER — HYDROMORPHONE HCL PF 1 MG/ML IJ SOLN
INTRAMUSCULAR | Status: AC
  Filled 2012-07-14: qty 1

## 2012-07-14 MED ORDER — ONDANSETRON HCL 4 MG/2ML IJ SOLN
INTRAMUSCULAR | Status: DC | PRN
  Administered 2012-07-14 (×2): 4 mg via INTRAVENOUS

## 2012-07-14 MED ORDER — ARTIFICIAL TEARS OP OINT
TOPICAL_OINTMENT | OPHTHALMIC | Status: DC | PRN
  Administered 2012-07-14: 1 via OPHTHALMIC

## 2012-07-14 MED ORDER — MIDAZOLAM HCL 5 MG/5ML IJ SOLN
INTRAMUSCULAR | Status: DC | PRN
  Administered 2012-07-14 (×2): 1 mg via INTRAVENOUS

## 2012-07-14 MED ORDER — OXYCODONE-ACETAMINOPHEN 5-325 MG PO TABS
2.0000 | ORAL_TABLET | ORAL | Status: DC | PRN
  Administered 2012-07-15 – 2012-07-16 (×3): 2 via ORAL
  Filled 2012-07-14 (×3): qty 2

## 2012-07-14 MED ORDER — BUPIVACAINE-EPINEPHRINE PF 0.25-1:200000 % IJ SOLN
INTRAMUSCULAR | Status: AC
  Filled 2012-07-14: qty 30

## 2012-07-14 MED ORDER — KCL IN DEXTROSE-NACL 20-5-0.45 MEQ/L-%-% IV SOLN
INTRAVENOUS | Status: DC
  Administered 2012-07-14 – 2012-07-15 (×2): via INTRAVENOUS
  Filled 2012-07-14 (×6): qty 1000

## 2012-07-14 MED ORDER — HEPARIN SOD (PORK) LOCK FLUSH 100 UNIT/ML IV SOLN
INTRAVENOUS | Status: DC | PRN
  Administered 2012-07-14: 500 [IU] via INTRAVENOUS

## 2012-07-14 MED ORDER — LIDOCAINE HCL (CARDIAC) 20 MG/ML IV SOLN
INTRAVENOUS | Status: DC | PRN
  Administered 2012-07-14: 100 mg via INTRAVENOUS

## 2012-07-14 MED ORDER — TECHNETIUM TC 99M SULFUR COLLOID FILTERED
1.0000 | Freq: Once | INTRAVENOUS | Status: AC | PRN
  Administered 2012-07-14: 1 via INTRADERMAL

## 2012-07-14 MED ORDER — NEOSTIGMINE METHYLSULFATE 1 MG/ML IJ SOLN
INTRAMUSCULAR | Status: DC | PRN
  Administered 2012-07-14: 3 mg via INTRAVENOUS

## 2012-07-14 MED ORDER — CEFAZOLIN SODIUM 1-5 GM-% IV SOLN
1.0000 g | Freq: Three times a day (TID) | INTRAVENOUS | Status: AC
  Administered 2012-07-14: 1 g via INTRAVENOUS
  Filled 2012-07-14: qty 50

## 2012-07-14 MED ORDER — ENOXAPARIN SODIUM 30 MG/0.3ML ~~LOC~~ SOLN
40.0000 mg | SUBCUTANEOUS | Status: DC
  Administered 2012-07-15 – 2012-07-16 (×2): 40 mg via SUBCUTANEOUS
  Filled 2012-07-14 (×3): qty 0.4

## 2012-07-14 MED ORDER — METHYLENE BLUE 1 % INJ SOLN
INTRAMUSCULAR | Status: AC
  Filled 2012-07-14: qty 10

## 2012-07-14 MED ORDER — DEXTROSE 5 % IV SOLN
3.0000 g | INTRAVENOUS | Status: DC
  Filled 2012-07-14 (×2): qty 3000

## 2012-07-14 MED ORDER — BUPIVACAINE-EPINEPHRINE 0.25% -1:200000 IJ SOLN
INTRAMUSCULAR | Status: DC | PRN
  Administered 2012-07-14: 30 mL

## 2012-07-14 MED ORDER — SODIUM CHLORIDE 0.9 % IR SOLN
Status: DC | PRN
  Administered 2012-07-14: 15:00:00

## 2012-07-14 MED ORDER — LACTATED RINGERS IV SOLN
INTRAVENOUS | Status: DC
  Administered 2012-07-14: 12:00:00 via INTRAVENOUS

## 2012-07-14 MED ORDER — HYDROMORPHONE HCL PF 1 MG/ML IJ SOLN
1.0000 mg | INTRAMUSCULAR | Status: DC | PRN
  Administered 2012-07-15 (×3): 1 mg via INTRAVENOUS
  Filled 2012-07-14 (×3): qty 1

## 2012-07-14 MED ORDER — 0.9 % SODIUM CHLORIDE (POUR BTL) OPTIME
TOPICAL | Status: DC | PRN
  Administered 2012-07-14 (×2): 1000 mL

## 2012-07-14 MED ORDER — PROPOFOL 10 MG/ML IV BOLUS
INTRAVENOUS | Status: DC | PRN
  Administered 2012-07-14: 150 mg via INTRAVENOUS

## 2012-07-14 MED ORDER — ONDANSETRON HCL 4 MG PO TABS
4.0000 mg | ORAL_TABLET | Freq: Four times a day (QID) | ORAL | Status: DC | PRN
  Administered 2012-07-15: 4 mg via ORAL
  Filled 2012-07-14: qty 1

## 2012-07-14 MED ORDER — ONDANSETRON HCL 4 MG/2ML IJ SOLN
4.0000 mg | Freq: Four times a day (QID) | INTRAMUSCULAR | Status: DC | PRN
  Administered 2012-07-14 – 2012-07-15 (×2): 4 mg via INTRAVENOUS
  Filled 2012-07-14 (×2): qty 2

## 2012-07-14 MED ORDER — DIPHENHYDRAMINE HCL 50 MG/ML IJ SOLN
25.0000 mg | Freq: Four times a day (QID) | INTRAMUSCULAR | Status: DC | PRN
  Administered 2012-07-15: 25 mg via INTRAVENOUS
  Filled 2012-07-14: qty 1

## 2012-07-14 MED ORDER — HEPARIN SOD (PORK) LOCK FLUSH 100 UNIT/ML IV SOLN
INTRAVENOUS | Status: AC
  Filled 2012-07-14: qty 5

## 2012-07-14 MED ORDER — METOCLOPRAMIDE HCL 5 MG/ML IJ SOLN: 10.0000 mg | Freq: Once | INTRAMUSCULAR | Status: DC | PRN

## 2012-07-14 MED ORDER — KETOROLAC TROMETHAMINE 15 MG/ML IJ SOLN
15.0000 mg | Freq: Four times a day (QID) | INTRAMUSCULAR | Status: DC | PRN
  Administered 2012-07-14 – 2012-07-16 (×4): 15 mg via INTRAVENOUS
  Filled 2012-07-14 (×5): qty 1

## 2012-07-14 MED ORDER — DEXAMETHASONE SODIUM PHOSPHATE 4 MG/ML IJ SOLN
INTRAMUSCULAR | Status: DC | PRN
  Administered 2012-07-14: 4 mg via INTRAVENOUS

## 2012-07-14 MED ORDER — FENTANYL CITRATE 0.05 MG/ML IJ SOLN
50.0000 ug | INTRAMUSCULAR | Status: DC | PRN
  Administered 2012-07-14: 50 ug via INTRAVENOUS

## 2012-07-14 MED ORDER — ROCURONIUM BROMIDE 100 MG/10ML IV SOLN
INTRAVENOUS | Status: DC | PRN
  Administered 2012-07-14: 50 mg via INTRAVENOUS

## 2012-07-14 MED ORDER — MIDAZOLAM HCL 2 MG/2ML IJ SOLN
1.0000 mg | INTRAMUSCULAR | Status: DC | PRN
  Administered 2012-07-14: 1 mg via INTRAVENOUS

## 2012-07-14 MED ORDER — FENTANYL CITRATE 0.05 MG/ML IJ SOLN
INTRAMUSCULAR | Status: DC | PRN
  Administered 2012-07-14: 50 ug via INTRAVENOUS
  Administered 2012-07-14: 25 ug via INTRAVENOUS
  Administered 2012-07-14 (×2): 100 ug via INTRAVENOUS
  Administered 2012-07-14: 75 ug via INTRAVENOUS
  Administered 2012-07-14 (×3): 50 ug via INTRAVENOUS

## 2012-07-14 MED ORDER — HYDROMORPHONE HCL PF 1 MG/ML IJ SOLN
0.2500 mg | INTRAMUSCULAR | Status: DC | PRN
  Administered 2012-07-14 (×4): 0.5 mg via INTRAVENOUS

## 2012-07-14 MED ORDER — KETOROLAC TROMETHAMINE 30 MG/ML IJ SOLN
INTRAMUSCULAR | Status: AC
  Administered 2012-07-14: 15 mg
  Filled 2012-07-14: qty 1

## 2012-07-14 MED ORDER — OXYCODONE HCL 5 MG PO TABS: 5.0000 mg | ORAL_TABLET | Freq: Once | ORAL | Status: DC | PRN

## 2012-07-14 MED ORDER — GLYCOPYRROLATE 0.2 MG/ML IJ SOLN
INTRAMUSCULAR | Status: DC | PRN
  Administered 2012-07-14: 0.4 mg via INTRAVENOUS

## 2012-07-14 MED ORDER — CHLORHEXIDINE GLUCONATE 4 % EX LIQD: 1.0000 "application " | Freq: Once | CUTANEOUS | Status: DC

## 2012-07-14 SURGICAL SUPPLY — 66 items
APPLIER CLIP 9.375 MED OPEN (MISCELLANEOUS) ×4
BAG DECANTER FOR FLEXI CONT (MISCELLANEOUS) ×4 IMPLANT
BANDAGE GAUZE ELAST BULKY 4 IN (GAUZE/BANDAGES/DRESSINGS) ×8 IMPLANT
BINDER BREAST LRG (GAUZE/BANDAGES/DRESSINGS) ×4 IMPLANT
BINDER BREAST XLRG (GAUZE/BANDAGES/DRESSINGS) IMPLANT
BLADE SURG 11 STRL SS (BLADE) ×4 IMPLANT
BLADE SURG 15 STRL LF DISP TIS (BLADE) ×3 IMPLANT
BLADE SURG 15 STRL SS (BLADE) ×1
CANISTER SUCTION 2500CC (MISCELLANEOUS) ×4 IMPLANT
CHLORAPREP W/TINT 26ML (MISCELLANEOUS) ×4 IMPLANT
CLIP APPLIE 9.375 MED OPEN (MISCELLANEOUS) ×3 IMPLANT
CLOTH BEACON ORANGE TIMEOUT ST (SAFETY) ×4 IMPLANT
COVER MAYO STAND STRL (DRAPES) ×4 IMPLANT
COVER SURGICAL LIGHT HANDLE (MISCELLANEOUS) ×4 IMPLANT
CRADLE DONUT ADULT HEAD (MISCELLANEOUS) ×4 IMPLANT
DECANTER SPIKE VIAL GLASS SM (MISCELLANEOUS) ×4 IMPLANT
DERMABOND ADVANCED (GAUZE/BANDAGES/DRESSINGS) ×4
DERMABOND ADVANCED .7 DNX12 (GAUZE/BANDAGES/DRESSINGS) ×12 IMPLANT
DRAIN CHANNEL 19F RND (DRAIN) ×4 IMPLANT
DRAPE C-ARM 42X72 X-RAY (DRAPES) ×4 IMPLANT
DRAPE LAPAROSCOPIC ABDOMINAL (DRAPES) ×4 IMPLANT
DRAPE UTILITY 15X26 W/TAPE STR (DRAPE) ×8 IMPLANT
ELECT CAUTERY BLADE 6.4 (BLADE) ×4 IMPLANT
ELECT REM PT RETURN 9FT ADLT (ELECTROSURGICAL) ×4
ELECTRODE REM PT RTRN 9FT ADLT (ELECTROSURGICAL) ×3 IMPLANT
EVACUATOR SILICONE 100CC (DRAIN) ×4 IMPLANT
GAUZE SPONGE 4X4 16PLY XRAY LF (GAUZE/BANDAGES/DRESSINGS) ×4 IMPLANT
GLOVE BIO SURGEON STRL SZ8 (GLOVE) ×4 IMPLANT
GLOVE BIOGEL PI IND STRL 8 (GLOVE) ×3 IMPLANT
GLOVE BIOGEL PI INDICATOR 8 (GLOVE) ×1
GLOVE SURG SS PI 7.0 STRL IVOR (GLOVE) ×4 IMPLANT
GOWN STRL NON-REIN LRG LVL3 (GOWN DISPOSABLE) ×8 IMPLANT
INTRODUCER COOK 11FR (CATHETERS) IMPLANT
KIT BASIN OR (CUSTOM PROCEDURE TRAY) ×4 IMPLANT
KIT PORT POWER 9.6FR MRI PREA (Catheter) IMPLANT
KIT PORT POWER ISP 8FR (Catheter) IMPLANT
KIT POWER CATH 8FR (Catheter) ×4 IMPLANT
KIT ROOM TURNOVER OR (KITS) ×4 IMPLANT
NEEDLE HYPO 25GX1X1/2 BEV (NEEDLE) ×4 IMPLANT
NS IRRIG 1000ML POUR BTL (IV SOLUTION) ×4 IMPLANT
PACK GENERAL/GYN (CUSTOM PROCEDURE TRAY) ×4 IMPLANT
PACK SURGICAL SETUP 50X90 (CUSTOM PROCEDURE TRAY) ×4 IMPLANT
PAD ARMBOARD 7.5X6 YLW CONV (MISCELLANEOUS) ×8 IMPLANT
PENCIL BUTTON HOLSTER BLD 10FT (ELECTRODE) ×4 IMPLANT
SET INTRODUCER 12FR PACEMAKER (SHEATH) IMPLANT
SET SHEATH INTRODUCER 10FR (MISCELLANEOUS) IMPLANT
SHEATH COOK PEEL AWAY SET 9F (SHEATH) IMPLANT
SPECIMEN JAR X LARGE (MISCELLANEOUS) ×4 IMPLANT
SPONGE GAUZE 4X4 12PLY (GAUZE/BANDAGES/DRESSINGS) ×8 IMPLANT
SPONGE LAP 18X18 X RAY DECT (DISPOSABLE) ×12 IMPLANT
SUT ETHILON 3 0 FSL (SUTURE) ×4 IMPLANT
SUT ETHILON 3 0 PS 1 (SUTURE) ×4 IMPLANT
SUT MNCRL AB 3-0 PS2 18 (SUTURE) ×16 IMPLANT
SUT MNCRL AB 4-0 PS2 18 (SUTURE) ×4 IMPLANT
SUT PROLENE 2 0 SH 30 (SUTURE) ×8 IMPLANT
SUT VIC AB 3-0 SH 18 (SUTURE) ×4 IMPLANT
SUT VIC AB 3-0 SH 27 (SUTURE) ×1
SUT VIC AB 3-0 SH 27X BRD (SUTURE) ×3 IMPLANT
SUT VIC AB 3-0 SH 8-18 (SUTURE) ×12 IMPLANT
SUT VICRYL AB 3 0 TIES (SUTURE) ×4 IMPLANT
SYR 20ML ECCENTRIC (SYRINGE) ×8 IMPLANT
SYR 5ML LUER SLIP (SYRINGE) ×4 IMPLANT
SYR CONTROL 10ML LL (SYRINGE) ×4 IMPLANT
TOWEL OR 17X24 6PK STRL BLUE (TOWEL DISPOSABLE) ×4 IMPLANT
TOWEL OR 17X26 10 PK STRL BLUE (TOWEL DISPOSABLE) ×4 IMPLANT
WATER STERILE IRR 1000ML POUR (IV SOLUTION) IMPLANT

## 2012-07-14 NOTE — Anesthesia Postprocedure Evaluation (Signed)
  Anesthesia Post-op Note  Patient: Sherry Barry  Procedure(s) Performed: Procedure(s) (LRB) with comments: SIMPLE MASTECTOMY WITH AXILLARY SENTINEL NODE BIOPSY (Right) - Bilateral simple mastectomy with port and right sebtibel lymph node mapping SIMPLE MASTECTOMY (Left) INSERTION PORT-A-CATH (Left)  Patient Location: PACU  Anesthesia Type:General  Level of Consciousness: awake  Airway and Oxygen Therapy: Patient Spontanous Breathing  Post-op Pain: mild  Post-op Assessment: Post-op Vital signs reviewed, Patient's Cardiovascular Status Stable, Respiratory Function Stable, Patent Airway, No signs of Nausea or vomiting and Pain level controlled  Post-op Vital Signs: stable  Complications: No apparent anesthesia complications

## 2012-07-14 NOTE — Brief Op Note (Signed)
07/14/2012  4:41 PM  PATIENT:  Sherry Barry  42 y.o. female  PRE-OPERATIVE DIAGNOSIS:  breast cancer  POST-OPERATIVE DIAGNOSIS:  breast cancer  PROCEDURE:  Procedure(s) (LRB) with comments: SIMPLE MASTECTOMY WITH AXILLARY SENTINEL NODE BIOPSY (Right) - Bilateral simple mastectomy with port and right sebtibel lymph node mapping SIMPLE MASTECTOMY (Left) INSERTION PORT-A-CATH (Left)  SURGEON:  Surgeon(s) and Role:    * Rilynne Lonsway A. Consetta Cosner, MD - Primary    * Wilmon Arms. Tsuei, MD - Assisting  PHYSICIAN ASSISTANT:    ASSISTANTS: Dr Corliss Skains    ANESTHESIA:   general  EBL:  Total I/O In: 1200 [I.V.:1200] Out: 200 [Blood:200]  BLOOD ADMINISTERED:none  DRAINS: (3 ) Jackson-Pratt drain(s) with closed bulb suction in the 2 on the right side and 1 onthe left   LOCAL MEDICATIONS USED:  NONE  SPECIMEN:  Source of Specimen:  bilateral breast and right SLN   DISPOSITION OF SPECIMEN:  PATHOLOGY  COUNTS:  YES  TOURNIQUET:  * No tourniquets in log *  DICTATION: .Other Dictation: Dictation Number   [id number]  PLAN OF CARE: Admit for overnight observation  PATIENT DISPOSITION:  PACU - hemodynamically stable.   Delay start of Pharmacological VTE agent (>24hrs) due to surgical blood loss or risk of bleeding: yes

## 2012-07-14 NOTE — Interval H&P Note (Signed)
History and Physical Interval Note:  07/14/2012 12:51 PM  Sherry Barry  has presented today for surgery, with the diagnosis of breast cancer  The various methods of treatment have been discussed with the patient and family. After consideration of risks, benefits and other options for treatment, the patient has consented to  Procedure(s) (LRB) with comments: SIMPLE MASTECTOMY WITH AXILLARY SENTINEL NODE BIOPSY (Right) - Bilateral simple mastectomy with port and right sebtibel lymph node mapping SIMPLE MASTECTOMY (Left) INSERTION PORT-A-CATH (N/A) as a surgical intervention .  The patient's history has been reviewed, patient examined, no change in status, stable for surgery.  I have reviewed the patient's chart and labs.  Questions were answered to the patient's satisfaction.     Isaac Dubie A.

## 2012-07-14 NOTE — Preoperative (Signed)
Beta Blockers   Reason not to administer Beta Blockers: not prescribed 

## 2012-07-14 NOTE — Transfer of Care (Signed)
Immediate Anesthesia Transfer of Care Note  Patient: Sherry Barry  Procedure(s) Performed: Procedure(s) (LRB) with comments: SIMPLE MASTECTOMY WITH AXILLARY SENTINEL NODE BIOPSY (Right) - Bilateral simple mastectomy with port and right sebtibel lymph node mapping SIMPLE MASTECTOMY (Left) INSERTION PORT-A-CATH (Left)  Patient Location: PACU  Anesthesia Type:General  Level of Consciousness: awake, alert  and oriented  Airway & Oxygen Therapy: Patient Spontanous Breathing and Patient connected to nasal cannula oxygen  Post-op Assessment: Report given to PACU RN, Post -op Vital signs reviewed and stable and Patient moving all extremities X 4  Post vital signs: Reviewed and stable  Complications: No apparent anesthesia complications

## 2012-07-14 NOTE — Progress Notes (Signed)
Chest xray done.

## 2012-07-14 NOTE — Anesthesia Preprocedure Evaluation (Signed)
Anesthesia Evaluation  Patient identified by MRN, date of birth, ID band Patient awake    Reviewed: Allergy & Precautions, H&P , NPO status , Patient's Chart, lab work & pertinent test results, reviewed documented beta blocker date and time   Airway Mallampati: II TM Distance: >3 FB Neck ROM: full    Dental   Pulmonary neg pulmonary ROS,  breath sounds clear to auscultation        Cardiovascular hypertension, Rhythm:regular     Neuro/Psych negative neurological ROS  negative psych ROS   GI/Hepatic negative GI ROS, Neg liver ROS,   Endo/Other  negative endocrine ROS  Renal/GU negative Renal ROS  negative genitourinary   Musculoskeletal   Abdominal   Peds  Hematology negative hematology ROS (+)   Anesthesia Other Findings See surgeon's H&P   Reproductive/Obstetrics negative OB ROS                           Anesthesia Physical Anesthesia Plan  ASA: II  Anesthesia Plan: General   Post-op Pain Management:    Induction: Intravenous  Airway Management Planned: LMA  Additional Equipment:   Intra-op Plan:   Post-operative Plan: Extubation in OR  Informed Consent: I have reviewed the patients History and Physical, chart, labs and discussed the procedure including the risks, benefits and alternatives for the proposed anesthesia with the patient or authorized representative who has indicated his/her understanding and acceptance.   Dental Advisory Given  Plan Discussed with: CRNA and Surgeon  Anesthesia Plan Comments:         Anesthesia Quick Evaluation

## 2012-07-14 NOTE — Anesthesia Procedure Notes (Signed)
Procedure Name: Intubation Date/Time: 07/14/2012 1:34 PM Performed by: Gayla Medicus Pre-anesthesia Checklist: Timeout performed, Patient identified, Emergency Drugs available, Patient being monitored and Suction available Patient Re-evaluated:Patient Re-evaluated prior to inductionOxygen Delivery Method: Circle system utilized Preoxygenation: Pre-oxygenation with 100% oxygen Intubation Type: IV induction Ventilation: Mask ventilation without difficulty Laryngoscope Size: Mac and 3 Grade View: Grade I Tube type: Oral Tube size: 7.5 mm Number of attempts: 1 Airway Equipment and Method: Stylet Placement Confirmation: ETT inserted through vocal cords under direct vision,  positive ETCO2 and breath sounds checked- equal and bilateral Secured at: 21 cm Tube secured with: Tape Dental Injury: Teeth and Oropharynx as per pre-operative assessment

## 2012-07-14 NOTE — H&P (View-Only) (Signed)
Patient ID: Sherry Barry, female   DOB: 11/24/1970, 42 y.o.   MRN: [id number]  No chief complaint on file.   HPI Sherry Barry is a 42 y.o. female.  Pt sent at request of Dr Eagle for right breast cancer.  She has had no symptoms.  Family history of pancreatic cancer but no breast cancer.  No mass or discharge. HPI  Past Medical History  Diagnosis Date  . Allergy   . Migraine   . Migraine   . Breast cancer     Past Surgical History  Procedure Date  . Abdominal hysterectomy     Family History  Problem Relation Age of Onset  . Hypertension Mother   . Alcohol abuse Mother   . Heart disease Maternal Grandmother   . Stroke Maternal Grandfather   . Colon cancer Maternal Aunt   . Brain cancer Paternal Uncle     Social History History  Substance Use Topics  . Smoking status: Never Smoker   . Smokeless tobacco: Not on file  . Alcohol Use: Yes    Allergies  Allergen Reactions  . Codeine Itching  . Latex Swelling    Current Outpatient Prescriptions  Medication Sig Dispense Refill  . aspirin 81 MG chewable tablet Chew 81 mg by mouth daily.      . meclizine (ANTIVERT) 50 MG tablet Take 1 tablet (50 mg total) by mouth 3 (three) times daily as needed.  45 tablet  0  . mometasone (NASONEX) 50 MCG/ACT nasal spray Place 2 sprays into the nose daily.  17 g  12    Review of Systems Review of Systems  Constitutional: Negative for fever, chills and unexpected weight change.  HENT: Negative for hearing loss, congestion, sore throat, trouble swallowing and voice change.   Eyes: Negative for visual disturbance.  Respiratory: Negative for cough and wheezing.   Cardiovascular: Negative for chest pain, palpitations and leg swelling.  Gastrointestinal: Negative for nausea, vomiting, abdominal pain, diarrhea, constipation, blood in stool, abdominal distention and anal bleeding.  Genitourinary: Negative for hematuria, vaginal bleeding and difficulty urinating.    Musculoskeletal: Negative for arthralgias.  Skin: Negative for rash and wound.  Neurological: Negative for seizures, syncope and headaches.  Hematological: Negative for adenopathy. Does not bruise/bleed easily.  Psychiatric/Behavioral: Negative for confusion.    There were no vitals taken for this visit.  Physical Exam Physical Exam  Constitutional: She is oriented to person, place, and time. She appears well-developed and well-nourished.  HENT:  Head: Normocephalic and atraumatic.  Eyes: EOM are normal. Pupils are equal, round, and reactive to light.  Neck: Normal range of motion. Neck supple.  Cardiovascular: Normal rate and regular rhythm.   Pulmonary/Chest: Effort normal and breath sounds normal. Right breast exhibits mass. Right breast exhibits no inverted nipple, no nipple discharge, no skin change and no tenderness. Left breast exhibits no inverted nipple, no mass, no nipple discharge, no skin change and no tenderness. Breasts are symmetrical.    Musculoskeletal: Normal range of motion.  Neurological: She is alert and oriented to person, place, and time.  Skin: Skin is warm and dry.  Psychiatric: She has a normal mood and affect. Her behavior is normal. Judgment and thought content normal.    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  In addition, there are solid masses in the lateral portion of the  right breast and suspicious calcifications in the   medial portion of  the right breast.  BILATERAL BREAST MRI WITH AND WITHOUT CONTRAST  Technique: Multiplanar, multisequence MR images of both breasts  were obtained prior to and following the intravenous administration  of 13ml of Multihance. Three dimensional images were evaluated at  the independent DynaCad workstation.  Comparison: Prior studies from the [facility] 06/16/2012 and earlier   Findings: There is significant bilateral breast enhancement,  limiting sensitivity and specificity for cancer detection.  Within the upper central portion of the right breast, there is an  enhancing mass which shows washout type enhancement kinetics and  measures 1.8 x 1.7 x 1.6 cm. This is associated with tissue marker  clip artifact from recent ultrasound guided core biopsy and is  consistent with known malignancy. Mass is irregular in shape.  Additionally, within the upper inner quadrant of the right breast,  there is clumped, nodular enhancement extending from the nipple  towards the chest wall and measuring 11.4 (anterior-posterior) x  3.5 x 2.7 cm. The findings are suspicious for significant area of  ductal carcinoma in situ, also suspected on the mammogram. The  area of abnormal calcifications measures approximately 10 cm  (anterior-posterior) mammographically. Within the upper-outer  quadrant of the right breast, there are three discrete irregular  masses, showing persistent type enhancement kinetics and measuring  3.0, 1.5, and 2.1 cm. These correlate well with the ultrasound  findings of additional masses in the upper-outer quadrant of the  right breast. Differential diagnosis includes malignancy but  fibroadenomas are favored.  Within the upper inner quadrant of the left breast, there is a  large area of clumped, nodular enhancement which measures 8.9  (anterior-posterior) x 2.2 x 2.7 cm. 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 Within the lower  central portion of the left breast, there is a 1.7 cm circumscribed  nodule showing persistent type enhancement kinetics. 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  No axillary or internal mammary adenopathy identified.  IMPRESSION:  1. Findings consistent with known malignancy in the right breast.  There are mammogram and MR  findings to suggest significant disease  throughout the upper-outer quadrant of the right breast.  2. There are three discrete masses within the upper outer quadrant  of the right breast. Biopsy can be performed if needed.  3. 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  RECOMMENDATION:  1. MR guided core biopsy is recommended of the upper inner  quadrant of the left breast.  2. If breast conservation is being considered for the right  breast, additional right breast biopsies are recommended.  THREE-DIMENSIONAL MR IMAGE RENDERING ON INDEPENDENT WORKSTATION:  Three-dimensional MR images were rendered by post-processing of the  original MR data on an independent workstation. 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 Await left breast biopsy Needs port\genetics See after biopsy to schedule surgery Hold on reconstruction       Dontay Harm A. 06/28/2012, 9:36 AM    

## 2012-07-15 ENCOUNTER — Encounter (HOSPITAL_COMMUNITY): Payer: Self-pay | Admitting: *Deleted

## 2012-07-15 LAB — COMPREHENSIVE METABOLIC PANEL
BUN: 8 mg/dL (ref 6–23)
CO2: 25 mEq/L (ref 19–32)
Calcium: 8.7 mg/dL (ref 8.4–10.5)
Chloride: 102 mEq/L (ref 96–112)
Creatinine, Ser: 0.61 mg/dL (ref 0.50–1.10)
GFR calc Af Amer: >90 mL/min (ref >90)
GFR calc non Af Amer: >90 mL/min (ref >90)
Glucose, Bld: 121 mg/dL — ABNORMAL HIGH (ref 70–99)
Total Bilirubin: 0.3 mg/dL (ref 0.3–1.2)

## 2012-07-15 LAB — BASIC METABOLIC PANEL
BUN: 7 mg/dL (ref 6–23)
Calcium: 8.2 mg/dL — ABNORMAL LOW (ref 8.4–10.5)
Creatinine, Ser: 0.62 mg/dL (ref 0.50–1.10)
GFR calc Af Amer: >90 mL/min (ref >90)
GFR calc non Af Amer: >90 mL/min (ref >90)
Glucose, Bld: 118 mg/dL — ABNORMAL HIGH (ref 70–99)
Potassium: 3.9 mEq/L (ref 3.5–5.1)

## 2012-07-15 LAB — CBC
Hemoglobin: 12 g/dL (ref 12.0–15.0)
MCH: 30 pg (ref 26.0–34.0)
MCHC: 34.7 g/dL (ref 30.0–36.0)
Platelets: 232 10*3/uL (ref 150–400)
RDW: 12.8 % (ref 11.5–15.5)

## 2012-07-15 LAB — CBC WITH DIFFERENTIAL/PLATELET
Eosinophils Relative: 0 % (ref 0–5)
HCT: 39.2 % (ref 36.0–46.0)
Hemoglobin: 13.6 g/dL (ref 12.0–15.0)
Lymphocytes Relative: 5 % — ABNORMAL LOW (ref 12–46)
Lymphs Abs: 0.7 10*3/uL (ref 0.7–4.0)
MCV: 87.3 fL (ref 78.0–100.0)
Monocytes Absolute: 0.5 10*3/uL (ref 0.1–1.0)
Monocytes Relative: 4 % (ref 3–12)
Neutro Abs: 11.9 10*3/uL — ABNORMAL HIGH (ref 1.7–7.7)
RDW: 12.7 % (ref 11.5–15.5)
WBC: 13.1 10*3/uL — ABNORMAL HIGH (ref 4.0–10.5)

## 2012-07-15 NOTE — Progress Notes (Signed)
Pt feeling better, nausea subsided.  Reviewed JP drainage record/sheet/how to empty drains.

## 2012-07-15 NOTE — Progress Notes (Signed)
1 Day Post-Op  Subjective: Stable and overt. Nauseated and has vomited. Pain control is good.  All drains functioning well. Drainage serosanguineous.Urine output adequate. Able to void.  Hemoglobin 12.0. Potassium 3.9. Creatinine 0.62.  Objective: Vital signs in last 24 hours: Temp:  [97.9 F (36.6 C)-98.7 F (37.1 C)] 98.2 F (36.8 C) (02/01 0455) Pulse Rate:  [65-100] 70  (02/01 0455) Resp:  [8-18] 16  (02/01 0455) BP: (90-147)/(58-97) 90/58 mmHg (02/01 0455) SpO2:  [99 %-100 %] 100 % (02/01 0455) Weight:  [136 lb 14.4 oz (62.097 kg)] 136 lb 14.4 oz (62.097 kg) (01/31 1854)    Intake/Output from previous day: 01/31 0701 - 02/01 0700 In: 1252 [I.V.:1200; IV Piggyback:52] Out: 1150 [Urine:750; Drains:200; Blood:200] Intake/Output this shift:    General appearance: alert. Mental status normal. Mild distress. In bed. Husband in room. Resp: clear to auscultation bilaterally Breasts, bilateral mastectomy skin flaps are pink and viable. No hematoma or seroma. All drains functioning, serosanguineous. No arm swelling.  Lab Results:  Results for orders placed during the hospital encounter of 07/14/12 (from the past 24 hour(s))  CBC     Status: Abnormal   Collection Time   07/14/12  7:49 PM      Component Value Range   WBC 14.8 (*) 4.0 - 10.5 K/uL   RBC 4.31  3.87 - 5.11 MIL/uL   Hemoglobin 12.8  12.0 - 15.0 g/dL   HCT 16.1  09.6 - 04.5 %   MCV 85.2  78.0 - 100.0 fL   MCH 29.7  26.0 - 34.0 pg   MCHC 34.9  30.0 - 36.0 g/dL   RDW 40.9  81.1 - 91.4 %   Platelets 235  150 - 400 K/uL  CREATININE, SERUM     Status: Normal   Collection Time   07/14/12  7:49 PM      Component Value Range   Creatinine, Ser 0.64  0.50 - 1.10 mg/dL   GFR calc non Af Amer >90  >90 mL/min   GFR calc Af Amer >90  >90 mL/min  CBC WITH DIFFERENTIAL     Status: Abnormal   Collection Time   07/14/12 11:00 PM      Component Value Range   WBC 13.1 (*) 4.0 - 10.5 K/uL   RBC 4.49  3.87 - 5.11 MIL/uL   Hemoglobin 13.6  12.0 - 15.0 g/dL   HCT 78.2  95.6 - 21.3 %   MCV 87.3  78.0 - 100.0 fL   MCH 30.3  26.0 - 34.0 pg   MCHC 34.7  30.0 - 36.0 g/dL   RDW 08.6  57.8 - 46.9 %   Platelets 225  150 - 400 K/uL   Neutrophils Relative 91 (*) 43 - 77 %   Neutro Abs 11.9 (*) 1.7 - 7.7 K/uL   Lymphocytes Relative 5 (*) 12 - 46 %   Lymphs Abs 0.7  0.7 - 4.0 K/uL   Monocytes Relative 4  3 - 12 %   Monocytes Absolute 0.5  0.1 - 1.0 K/uL   Eosinophils Relative 0  0 - 5 %   Eosinophils Absolute 0.0  0.0 - 0.7 K/uL   Basophils Relative 0  0 - 1 %   Basophils Absolute 0.0  0.0 - 0.1 K/uL  COMPREHENSIVE METABOLIC PANEL     Status: Abnormal   Collection Time   07/14/12 11:00 PM      Component Value Range   Sodium 136  135 - 145 mEq/L  Potassium 3.6  3.5 - 5.1 mEq/L   Chloride 102  96 - 112 mEq/L   CO2 25  19 - 32 mEq/L   Glucose, Bld 121 (*) 70 - 99 mg/dL   BUN 8  6 - 23 mg/dL   Creatinine, Ser 1.61  0.50 - 1.10 mg/dL   Calcium 8.7  8.4 - 09.6 mg/dL   Total Protein 6.4  6.0 - 8.3 g/dL   Albumin 3.2 (*) 3.5 - 5.2 g/dL   AST 14  0 - 37 U/L   ALT 7  0 - 35 U/L   Alkaline Phosphatase 46  39 - 117 U/L   Total Bilirubin 0.3  0.3 - 1.2 mg/dL   GFR calc non Af Amer >90  >90 mL/min   GFR calc Af Amer >90  >90 mL/min  BASIC METABOLIC PANEL     Status: Abnormal   Collection Time   07/15/12  5:00 AM      Component Value Range   Sodium 135  135 - 145 mEq/L   Potassium 3.9  3.5 - 5.1 mEq/L   Chloride 103  96 - 112 mEq/L   CO2 25  19 - 32 mEq/L   Glucose, Bld 118 (*) 70 - 99 mg/dL   BUN 7  6 - 23 mg/dL   Creatinine, Ser 0.45  0.50 - 1.10 mg/dL   Calcium 8.2 (*) 8.4 - 10.5 mg/dL   GFR calc non Af Amer >90  >90 mL/min   GFR calc Af Amer >90  >90 mL/min  CBC     Status: Abnormal   Collection Time   07/15/12  5:00 AM      Component Value Range   WBC 11.0 (*) 4.0 - 10.5 K/uL   RBC 4.00  3.87 - 5.11 MIL/uL   Hemoglobin 12.0  12.0 - 15.0 g/dL   HCT 40.9 (*) 81.1 - 91.4 %   MCV 86.5  78.0 - 100.0 fL    MCH 30.0  26.0 - 34.0 pg   MCHC 34.7  30.0 - 36.0 g/dL   RDW 78.2  95.6 - 21.3 %   Platelets 232  150 - 400 K/uL     Studies/Results: @RISRSLT24 @     . enoxaparin  40 mg Subcutaneous Q24H     Assessment/Plan: s/p Procedure(s): SIMPLE MASTECTOMY WITH AXILLARY SENTINEL NODE BIOPSY SIMPLE MASTECTOMY INSERTION PORT-A-CATH  POD #1. Stable. Nauseated and vomiting. Suspect this will be self-limited Continue IV support area and Not radiate discharge, hopefully discharge home tomorrow Encourage ambulation. Diet as tolerated.    LOS: 1 day    Demarian Epps M. Derrell Lolling, M.D., [facility] General and Minimally invasive Surgery Breast and Colorectal Surgery Office:   [phone] Pager:   [phone]  07/15/2012  . .prob

## 2012-07-16 MED ORDER — OXYCODONE-ACETAMINOPHEN 7.5-325 MG PO TABS: 1.0000 | ORAL_TABLET | ORAL | Status: DC | PRN

## 2012-07-16 NOTE — Progress Notes (Signed)
Pt discharged to home accomp by husband in wheelchair.  All discharge instructions given and reviewed w/ pt and husband, copy given.  All FU appts reviewed w/ pt.  Additional breast binder given to pt for home use.  Pt understands how to empty drains and what to call MD for at home, has resource # if any problems, questions or concerns.  Rx given to pt for percocet and explained.  Reviewed arm precautions w/ pt and ABC class once approved by Dr. Luisa Hart.  Pt verbalized no other concerns about home self care.

## 2012-07-16 NOTE — Progress Notes (Signed)
2 Days Post-Op  Subjective: Feeling much better. Tolerating diet. Ambulatory. Pain control. Wants to go home.  Objective: Vital signs in last 24 hours: Temp:  [97.7 F (36.5 C)-99.2 F (37.3 C)] 98.2 F (36.8 C) (02/02 0524) Pulse Rate:  [64-75] 64  (02/02 0524) Resp:  [18-20] 19  (02/02 0524) BP: (97-121)/(62-89) 103/65 mmHg (02/02 0524) SpO2:  [99 %-100 %] 99 % (02/02 0524)    Intake/Output from previous day: 02/01 0701 - 02/02 0700 In: 3272.3 [P.O.:700; I.V.:2572.3] Out: 928 [Urine:800; Drains:128] Intake/Output this shift: Total I/O In: 30 [Other:30] Out: -   General appearance: alert. No distress. Mental status normal. Good spirits. Husband in room. Breasts:  bilateral mastectomy incisions look good. Skin flaps viable. No hematoma. All drains functioning, serosanguineous.  Lab Results:  No results found for this or any previous visit (from the past 24 hour(s)).   Studies/Results: @RISRSLT24 @     . enoxaparin  40 mg Subcutaneous Q24H     Assessment/Plan: s/p Procedure(s): SIMPLE MASTECTOMY WITH AXILLARY SENTINEL NODE BIOPSY SIMPLE MASTECTOMY INSERTION PORT-A-CATH  POD #2.  Good progress.[name]  Discharge home today. Wound, drain care, activities, diet, hydration discussed Prescription for Percocet given. Followup with Dr. Luisa Hart in one week for drain and wound check.       LOS: 2 days    Reba Hulett M 07/16/2012  . .prob

## 2012-07-16 NOTE — Discharge Summary (Signed)
  Patient ID: Sherry Barry [id number] 42 y.o. 07-Feb-1971  07/14/2012  Discharge date and time: July 16, 2012  Admitting Physician: Ernestene Mention  Discharge Physician: Ernestene Mention  Admission Diagnoses: breast cancer  Discharge Diagnosis:   Invasive cancer right breast, clinical stage 1. Final pathology pending.                                          Abnormal MRI left breast, biopsy negative for malignancy.                                          Patient desires prophylactic left mastectomy.  Operations: Procedure(s): SIMPLE MASTECTOMY WITH AXILLARY SENTINEL NODE BIOPSY SIMPLE MASTECTOMY INSERTION PORT-A-CATH  Admission Condition: good  Discharged Condition: good  Indication for Admission:This 42 year old woman presented recently with abnormal mammogram of the right breast. Biopsy showed invasive cancer. Clinically and radiographically this is stage I. She subtotally underwent image guided biopsy of the left breast that showed benign findings. She discussed her care with Dr. Luisa Hart and with medical oncology. She elected to have bilateral mastectomies and Port-A-Cath placement and was brought to the hospital electively.  Hospital Course: On the day of admission the patient was taken to the operating room and underwent bilateral total mastectomy, right axillary sentinel node biopsy, and Port-A-Cath insertion. Pathology is pending at time of discharge. On postop day 1 the patient was a little bit uncomfortable and had problems with nausea and vomiting. By postop day 2 she was feeling well and ready to go  home. Her wounds looked good. Drains were functioning. She was educated regarding drain care. She was ambulatory and tolerating a diet. Pain was under good control. She was given a prescription for Percocet for pain. She was advised to followup with Dr. Luisa Hart within one week.  Consults: None  Significant Diagnostic Studies: Pathology, pending  Treatments:  surgery: Bilateral total mastectomy, right axillary sentinel node biopsy, Port-A-Cath insertion  Disposition: Home  Patient Instructions:   [name], [name]  Home Medication Instructions [id number]   Printed on:07/16/12 0830  Medication Information                    mometasone (NASONEX) 50 MCG/ACT nasal spray Place 2 sprays into the nose daily.           meclizine (ANTIVERT) 50 MG tablet Take 50 mg by mouth 3 (three) times daily as needed. dizziness           oxyCODONE-acetaminophen (PERCOCET) 7.5-325 MG per tablet Take 1 tablet by mouth every 4 (four) hours as needed for pain.             Activity: activity as tolerated Diet: regular diet Wound Care: as directed  Follow-up:  With Dr Luisa Hart in 1 week.  Signed: Angelia Mould. Derrell Lolling, M.D., FACS General and minimally invasive surgery Breast and Colorectal Surgery  07/16/2012, 8:30 AM

## 2012-07-16 NOTE — Progress Notes (Signed)
Breast cancer bag given to pt. 

## 2012-07-17 ENCOUNTER — Encounter (HOSPITAL_COMMUNITY): Payer: Self-pay | Admitting: Surgery

## 2012-07-17 NOTE — Op Note (Signed)
NAMEALISEN, Sherry Barry       ACCOUNT NO.:  [id number]  MEDICAL RECORD NO.:  [id number]  LOCATION:                                 FACILITY:  PHYSICIAN:  Maryalice Pasley A. Dariya Gainer, M.D.DATE OF BIRTH:  [birth year]  DATE OF PROCEDURE:  07/14/2012 DATE OF DISCHARGE:                              OPERATIVE REPORT   PREOPERATIVE DIAGNOSES: 1. Stage II right breast cancer. 2. Dense left breast with severe fibrocystic change.  POSTOPERATIVE DIAGNOSES: 1. Stage II right breast cancer. 2. Dense left breast with severe fibrocystic change.  PROCEDURE: 1. Right simple mastectomy with right axillary sentinel lymph node     mapping. 2. Left simple mastectomy. 3. Placement of left subclavian 8-French power of Port-A-Cath with     fluoroscopic guidance.  SURGEON:  Maisie Fus A. Lyrika Souders, M.D.  ASSISTANT:  Wilmon Arms. Tsuei, M.D.  ANESTHESIA:  General endotracheal anesthesia.  ESTIMATED BLOOD LOSS:  400 mL.  SPECIMEN: 1. Right breast with right axillary sentinel nodes to pathology. 2. Left breast.  DRAINS:  2 round Jackson-Pratt drains on the right and 1 Jackson-Pratt drains to the left.  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  She had a dense left breast by MRI.  Core biopsy shows severe fibrocystic change.  We discussed the options of observation on the left versus mastectomy on the left as well as mastectomy on the right given its size.  She was not a breast conservation candidate nor any desire to do so.  She wished to have both breasts removed and she would need a Port-A-Cath for postoperative chemotherapy.  Risk, benefits, and alternative therapies were all discussed with her.  Both breast conservation and mastectomy were discussed in great detail.  Port-A-Cath placed for risks, benefits, and alternative means were discussed.  Risk of bleeding, infection, or injury, nerve injury, blood vessel injury,  pneumothorax, hemothorax, cardiac injury, potential death, and need for further operations discussed.  She voiced understanding of the above and agreed to proceed.  DESCRIPTION OF PROCEDURE:  She was met in the holding area and questions were answered.  She underwent right breast injection by a Nuclear Medicine for her sentinel node.  She was taken back to the operating room and placed supine.  She after induction of general anesthesia, both breasts and upper chest regions were prepped and draped in a sterile fashion.  Time-out was done.  She received preoperative antibiotics. The Port-A-Cath was placed 1st in the left side.  The patient had Trendelenburg.  The left subclavian vein was cannulated without difficulty.  Wire was passed without difficulty.  Fluoroscopy showed the wire passing into the superior vena cava.  Small pocket was created with the scalpel blade.  An 8-French power port was brought on to the field attached and flushed.  It was tunneled from the lower incision to the insertion site, where the wire was inserted.  We then with the patient in Trendelenburg, was able to pass the dilator introducer over the wire without difficulty, moving the wire to and fro with no resistance.  The catheter was cut to the appropriate length.  It was passed through the peel-away sheath after removal  of the dilator and wire and the peel-away sheath was peeled away.  The tip of the catheter was in the superior vena cava.  It drew back easily with dark nonpulsatile blood and flushed easily.  It was flushed with heparinized saline.  Incisions were closed with 3-0 Vicryl and 4-0 Monocryl.  Dermabond was applied.  The left mastectomy is done first.  Curvilinear incisions were made above and below the nipple-areolar in a fishmouth configuration above and below the nipple-areolar complex.  Superior and inferior skin flaps were raised.  She had a very dense breast and the tissue was very difficult  to dissect through.  Superior and inferior skin flaps were raised.  The breast was then removed from the chest wall.  The pectoralis major in a medial to lateral fashion had passed off the field.  Hemostasis was achieved.  This was packed and dressed with a moist gauze and a towel was placed over.  The right mastectomy was then done.  A curvilinear incision was made above and below the nipple-areolar complex.  Superior and inferior skin flaps were raised with cautery.  Once we entered the axilla Neoprobe was used, and sentinel nodes were identified.  A total of 4 sentinel nodes were identified.  These were hot.  No blue dye was used in this setting. Background counts of the axilla approached 0.  At this point in time, we then removed the breast in a medial to lateral fashion off taking the pectoralis fascia with it.  It was appropriately marked and passed off the field.  This was irrigated.  We then placed 2 round Al Pimple drains in this site and secured the skin with 2-0 nylon.  We then closed with a combination of 3-0 Vicryl and 3-0 Monocryl.  Dermabond was applied.  I then put on the fresh gloves.  Fresh instruments were used to close the left side.  It was closed in similar fashion using 3-0 Vicryl pop offs and 3-0 Monocryl in a subcuticular fashion.  Dermabond was applied.  Fluff dressing was applied and a binder was placed.  All final counts of sponge, needle, and instruments were found to be correct at this portion of the case.  The patient was then awoke and extubated, taken to recovery in satisfactory condition.  Chest x-ray will be obtained postoperatively.     Takeisha Cianci A. Kadarrius Yanke, M.D.     TAC/MEDQ  D:  07/14/2012  T:  07/15/2012  Job:  [id number]

## 2012-07-20 ENCOUNTER — Inpatient Hospital Stay (HOSPITAL_COMMUNITY): Admission: RE | Admit: 2012-07-20 | Payer: BC Managed Care – PPO | Source: Ambulatory Visit

## 2012-07-20 ENCOUNTER — Ambulatory Visit (HOSPITAL_COMMUNITY): Admission: RE | Admit: 2012-07-20 | Payer: BC Managed Care – PPO | Source: Ambulatory Visit

## 2012-07-20 ENCOUNTER — Encounter: Payer: Self-pay | Admitting: *Deleted

## 2012-07-22 ENCOUNTER — Encounter: Payer: Self-pay | Admitting: *Deleted

## 2012-07-22 NOTE — Progress Notes (Signed)
Mailed after appt letter to pt. 

## 2012-07-27 ENCOUNTER — Ambulatory Visit (HOSPITAL_COMMUNITY): Payer: BC Managed Care – PPO

## 2012-07-27 ENCOUNTER — Other Ambulatory Visit: Payer: Self-pay | Admitting: *Deleted

## 2012-07-27 NOTE — Progress Notes (Signed)
SPOKE WITH PT PER APPTS TOMORROW- RESCHEDULED MD AND SENT POF TO RESCHEDULE ECHO .

## 2012-07-28 ENCOUNTER — Other Ambulatory Visit (HOSPITAL_COMMUNITY): Payer: BC Managed Care – PPO

## 2012-07-28 ENCOUNTER — Ambulatory Visit: Payer: BC Managed Care – PPO | Admitting: Oncology

## 2012-07-29 ENCOUNTER — Other Ambulatory Visit: Payer: Self-pay

## 2012-07-31 ENCOUNTER — Encounter (INDEPENDENT_AMBULATORY_CARE_PROVIDER_SITE_OTHER): Payer: Self-pay | Admitting: Surgery

## 2012-07-31 ENCOUNTER — Ambulatory Visit (INDEPENDENT_AMBULATORY_CARE_PROVIDER_SITE_OTHER): Payer: BC Managed Care – PPO | Admitting: Surgery

## 2012-07-31 VITALS — BP 124/70 | HR 76 | Temp 98.3°F | Resp 18 | Ht 62.5 in | Wt 133.0 lb

## 2012-07-31 DIAGNOSIS — Z9889 Other specified postprocedural states: Secondary | ICD-10-CM

## 2012-07-31 NOTE — Progress Notes (Signed)
Patient returns after bilateral mastectomy for multifocal right breast cancer T2 N1 MX ER positive PR positive HER-2/neu amplified. One of 4 nodes had metastatic disease with a second noted with isolated tumor cells. She's doing well.   Exam: Bilateral mastectomy incisions healing well. Drains removed. Range of motion good with no lymphedema.  Plan: Stage II multifocal right breast cancer  Plan: To see medical oncology this week. Standard cares to do a lymph node dissection but I think in light of ongoing therapy this is of low benefits but can be done. This will be discussed with oncology. Return one month sooner.

## 2012-07-31 NOTE — Patient Instructions (Addendum)
Return 1 monthExercises Following Breast Surgery Following all surgeries on the breast or axillary nodes, whether you had radiation treatment or not, exercises may help you return to your normal activities and way of life sooner. Before beginning any exercise, talk to your caregiver about what type of exercises will be best for you. Your caregiver may recommend getting physical therapy to help you, especially if you do not see any progress in a month of exercising. Some light exercises can be done right after the surgery, but any drains and sutures will be removed before doing the extended or heavy exercises. Generally, the exercises will lessen problems following the surgery. You can usually expect to have full range of motion of your arm back in 4 to 6 weeks.  HOME CARE INSTRUCTIONS  These exercises should be done for the first 3 to 7 days after surgery, but only with your doctor's permission.   Use your affected arm (on the side where your surgery was) as you normally would when combing your hair, bathing, dressing and eating.  Raise your affected arm above the level of your heart for 45 minutes, 2 to 3 times a day, while lying down. Put your arm on pillows so that your hand is higher than your wrist and your elbow is a little higher than your shoulder. This will help decrease the swelling that may happen after surgery.  Exercise your affected arm while it is elevated above the level of your heart by opening and closing your hand 15 to 25 times. Then, bend and straighten your elbow. Repeat this 3 to 4 times a day. This exercise helps reduce swelling by pumping lymph fluid out of your arm.  Practice deep breathing exercises (using your diaphragm) at least 6 times each day. While lying on your back, take a slow, deep breath. Breathe in as much air as you can while trying to expand your chest and abdomen (push your belly button away from your spine). Relax and breathe out. Repeat this 4 or 5 times. This  exercise will help maintain normal movement of your chest, making it easier for your lungs to expand. Continue to do deep breathing exercises from now on.  Avoid sleeping on your affected arm or on that side.  Do not lift anything over 5 pounds.  Stop exercising if you develop pain in your chest, arm or shoulder, and call your caregiver.  Let your caregiver or therapist know if your arm becomes swollen after exercising.  Exercise in front of a mirror. This way you can see yourself exercising in a correct posture and using the correct motion that is recommended.  Do not use a heating pad on your arm of the side of the surgery. GENERAL GUIDELINES FOR EXERCISE The exercises described here can be done as soon as your doctor gives you permission. Be sure to talk to your doctor before trying any of them.   You will feel some tightness in your chest and armpit after surgery. This is normal. The tightness will decrease as you continue your exercise program.  Many women have a burning, tingling, numbness, or soreness on the back of the arm and/or chest wall. This is because the surgery irritated some of your nerve endings. Although the sensations may increase a few weeks after surgery, continue to do the exercises unless you notice unusual swelling or tenderness. (Tell your caregiver if this occurs.) Sometimes rubbing or stroking the area with your hand or a soft cloth can help make  the area less sensitive.  It may be helpful to do exercises after a warm shower when muscles are warm and relaxed.  Wear comfortable, loose clothing when doing the exercises.  Do the exercises until you feel a slow stretch. Hold each stretch at the end of the motion for a count of five. It is normal to feel some pulling as you stretch the skin and muscles that have been shortened because of the surgery. Do not do bouncing or jerky-type movements when doing any of the exercises. You should not feel pain as you do the  exercises, only gentle stretching.  Do 5 to 7 repetitions of each exercise. Try to do each exercise correctly. If you have difficulty with the exercises, contact your doctor. You may need to be referred to a physical or occupational therapist.  Exercises should be done twice a day until you regain normal flexibility and strength.  Be sure to take deep breaths, in and out, as you perform each exercise.  The exercises are designed so that you begin them lying down, move to sitting, and then finish standing. EXERCISES IN LYING POSITION These exercises should be performed on a bed or on the floor while lying on your back. Keep your knees and hips bent and feet flat.  Wand Exercise This exercise helps increase the forward motion of the shoulders. You will need a broom handle, yardstick, or other similar object to perform this exercise.   Hold the wand in both hands with palms facing up.  Lift the wand up over your head (as far as you can) using your unaffected arm to help lift the wand, until you feel a stretch in your affected arm.  Hold for five seconds.  Lower arms and repeat 5 to 7 times. Elbow Winging This exercise helps increase the mobility of the front of your chest and shoulder. It may take several weeks of regular exercise before your elbows will get close to the bed (or floor).   Clasp your hands behind your neck with your elbows pointing toward the ceiling.  Move your elbows apart and down toward the bed (or floor).  Repeat 5 to 7 times. EXERCISES IN SITTING POSITION Shoulder Blade Stretch This exercise helps increase the mobility of the shoulder blades.   Sit in a chair very close to a table.  Place the unaffected arm on the table with your elbow bent and palm down. Do not move this arm during the exercise.  Place the affected arm on the table, palm down with your elbow straight.  Without moving your trunk, slide the affected arm toward the opposite side of the table.  You should feel your shoulder blade move as you do this.  Relax your arm and repeat 5 to 7 times. Shoulder Blade Squeeze This exercise also helps increase the mobility of the shoulder blade.   Facing straight ahead, sit in a chair in front of a mirror without resting on the back of the chair.  Arms should be at your sides with elbows bent.  Squeeze shoulder blades together, bringing your elbows behind you. Keep your shoulders level as you do this exercise. Do not lift your shoulders up toward your ears.  Return to the starting position and repeat 5 to 7 times. Side Bending This exercise helps increase the mobility of the trunk/body.   Clasp your hands together in front of you and lift your arms slowly over your head, straightening your arms.  When your arms are over  your head, bend your trunk to the right while bending at the waist and keeping your arms overhead.  Return to the starting position and bend to the left.  Repeat 5 to 7 times. EXERCISES IN STANDING POSITION Chest Wall Stretch This exercise helps stretch the chest wall.   Stand facing a corner with toes approximately 8 to 10 inches from the corner.  Bend your elbows and place forearms on the wall, one on each side of the corner. Your elbows should be as close to shoulder height as possible.  Keep your arms and feet in position and move your chest toward the corner. You will feel a stretch across your chest and shoulders.  Return to starting position and repeat 5 to 7 times. Shoulder Stretch This exercise helps increase the mobility in the shoulder.   Stand facing the wall with your toes approximately 8 to 10 inches from the wall.  Place your hands on the wall. Use your fingers to "climb the wall," reaching as high as you can until you feel a stretch.  Return to starting position and repeat 5 to 7 times. THINGS TO KEEP IN MIND  Begin exercising slowly and keep going as you are able. Stop exercising and call your  caregiver if you:  Get weaker, start losing your balance or start falling.  Have pain that gets worse.  Have new heaviness in your arm.  Have unusual swelling, or swelling gets worse.  Have headaches, dizziness, blurred vision, new numbness or tingling in arms or chest. It is important to exercise to keep muscles working as well as possible, but it is also important to be safe. Talk with your caregiver about realistic exercises for your condition. Then set goals for increasing your physical activity level.  Document Released: 12/21/2005 Document Revised: 08/23/2011 Document Reviewed: 07/15/2008 [facility] Patient Information 2013 [place], [place].

## 2012-08-01 ENCOUNTER — Ambulatory Visit (HOSPITAL_COMMUNITY)
Admission: RE | Admit: 2012-08-01 | Discharge: 2012-08-01 | Disposition: A | Discharge status: Home / Self Care | Payer: BC Managed Care – PPO | Source: Ambulatory Visit | Attending: Oncology | Admitting: Oncology

## 2012-08-01 DIAGNOSIS — I369 Nonrheumatic tricuspid valve disorder, unspecified: Secondary | ICD-10-CM

## 2012-08-01 DIAGNOSIS — Z01818 Encounter for other preprocedural examination: Secondary | ICD-10-CM | POA: Insufficient documentation

## 2012-08-01 DIAGNOSIS — C50219 Malignant neoplasm of upper-inner quadrant of unspecified female breast: Secondary | ICD-10-CM

## 2012-08-01 DIAGNOSIS — I079 Rheumatic tricuspid valve disease, unspecified: Secondary | ICD-10-CM | POA: Insufficient documentation

## 2012-08-01 DIAGNOSIS — Z8249 Family history of ischemic heart disease and other diseases of the circulatory system: Secondary | ICD-10-CM | POA: Insufficient documentation

## 2012-08-01 DIAGNOSIS — C50919 Malignant neoplasm of unspecified site of unspecified female breast: Secondary | ICD-10-CM | POA: Insufficient documentation

## 2012-08-01 NOTE — Progress Notes (Signed)
  Echocardiogram 2D Echocardiogram has been performed.  Sherry Barry 08/01/2012, 10:03 AM

## 2012-08-03 ENCOUNTER — Ambulatory Visit (HOSPITAL_BASED_OUTPATIENT_CLINIC_OR_DEPARTMENT_OTHER): Payer: BC Managed Care – PPO | Admitting: Oncology

## 2012-08-03 VITALS — BP 125/87 | HR 102 | Temp 98.5°F | Resp 20 | Ht 62.5 in | Wt 133.4 lb

## 2012-08-03 DIAGNOSIS — Z170 Estrogen receptor positive status [ER+]: Secondary | ICD-10-CM

## 2012-08-03 DIAGNOSIS — C50219 Malignant neoplasm of upper-inner quadrant of unspecified female breast: Secondary | ICD-10-CM

## 2012-08-03 MED ORDER — VALACYCLOVIR HCL 500 MG PO TABS: 500.0000 mg | ORAL_TABLET | Freq: Two times a day (BID) | ORAL | Status: DC

## 2012-08-03 MED ORDER — PROCHLORPERAZINE 25 MG RE SUPP: 25.0000 mg | Freq: Two times a day (BID) | RECTAL | Status: DC | PRN

## 2012-08-03 MED ORDER — PROCHLORPERAZINE MALEATE 10 MG PO TABS: 10.0000 mg | ORAL_TABLET | Freq: Four times a day (QID) | ORAL | Status: DC | PRN

## 2012-08-03 MED ORDER — LORAZEPAM 0.5 MG PO TABS: 0.5000 mg | ORAL_TABLET | Freq: Four times a day (QID) | ORAL | Status: DC | PRN

## 2012-08-03 MED ORDER — LIDOCAINE-PRILOCAINE 2.5-2.5 % EX CREA: TOPICAL_CREAM | CUTANEOUS | Status: DC | PRN

## 2012-08-03 MED ORDER — DEXAMETHASONE 4 MG PO TABS: 8.0000 mg | ORAL_TABLET | Freq: Two times a day (BID) | ORAL | Status: DC

## 2012-08-03 MED ORDER — ONDANSETRON HCL 8 MG PO TABS: 8.0000 mg | ORAL_TABLET | Freq: Two times a day (BID) | ORAL | Status: DC

## 2012-08-03 NOTE — Progress Notes (Signed)
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 At that time it digital diagnostic mammography showed pleomorphic calcifications extending over 9 cm in the inner third of the right breast. There were no suspicious calcifications in the left breast. On exam, Dr. Lyman Bishop was able to palpate a small freely mobile mass in the lower inner left breast. Ultrasound showed a horizontally oriented hypoechoic macrolobulated mass in the left breast measuring approximately 1.3 cm. 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  Bilateral breast MRI 06/23/2012 showed an enhancing mass in the upper central right breast measuring 1.8 cm, together with clumped nodular enhancement extending for a total area of 11.4 cm. In the upper outer quadrant of this breast there were 3 discrete irregular masses measuring up to 3 cm. This correlated well with the ultrasound findings previously. In the left breast there was a large area of clumped nodular enhancement measuring up to 8.9 cm. In the lower central portion there was a 1.7 cm circumscribed nodule consistent with a known fibroadenoma. There weas no axillary or internal mammary adenopathy noted on either side. The patient's subsequent history is as detailed below  INTERVAL HISTORY: Sherry Barry returns today with her husband Cristal Deer for followup of her breast cancer. Since her last visit here she had bilateral mastectomies. She tolerated the surgery well, although the right side feels  a little bit "tight". She just had her drains removed two days ago. Otherwise has been no unusual bleeding, swelling, fever, or other complications.  REVIEW OF SYSTEMS: She does have discomfort in the right axilla. This is 80 and fairly constant at present. He doesn't single, and there is no right upper extremity swelling or erythema. There have been no unusual headaches, visual changes, cough, phlegm production, pleurisy, or change in bowel or bladder habits. A detailed review of systems was otherwise noncontributory.  PAST MEDICAL HISTORY: Past Medical History  Diagnosis Date  . Allergy   . Migraine   . Migraine   . Breast cancer   . History of migraine     last one about a week ago    PAST SURGICAL HISTORY: Past Surgical History  Procedure Laterality Date  . Abdominal hysterectomy      no salpingo-oophorectomy  . Simple mastectomy with axillary sentinel node biopsy  07/14/2012    Procedure: SIMPLE MASTECTOMY WITH AXILLARY SENTINEL NODE BIOPSY;  Surgeon: Clovis Pu. Cornett, MD;  Location: [facility] OR;  Service: General;  Laterality: Right;  Bilateral simple mastectomy with port and right sebtibel lymph node mapping  . Simple mastectomy with axillary sentinel node biopsy  07/14/2012    Procedure: SIMPLE MASTECTOMY;  Surgeon: Clovis Pu. Cornett, MD;  Location: [facility] OR;  Service: General;  Laterality: Left;  . Portacath placement  07/14/2012    Procedure: INSERTION PORT-A-CATH;  Surgeon: Clovis Pu. Cornett, MD;  Location: [facility] OR;  Service: General;  Laterality: Left;    FAMILY HISTORY Family History  Problem Relation Age of Onset  . Hypertension Mother   .  Alcohol abuse Mother   . Heart disease Maternal Grandmother   . Stroke Maternal Grandfather   . Colon cancer Maternal Aunt   . Brain cancer Paternal Uncle    the patient's father died in an automobile accident in his early 32s. The patient's mother is living, currently age 36. The patient has one brother and one sister. There is no family  history of breast or ovarian cancer, although there are maternal uncles with brain, colon and lymph node cancers.  GYNECOLOGIC HISTORY: Menarche age 59, first live birth age 7, she is GX P2. She stopped having periods with her surgery March of 2009. She does have "premenstrual symptoms" on a regular basis.  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 Her husband Brand Males works for a company in the [place] doing testing. Their children Jaden (9) and Bryson (4) at home.   ADVANCED DIRECTIVES: not in place  HEALTH MAINTENANCE: History  Substance Use Topics  . Smoking status: Never Smoker   . Smokeless tobacco: Not on file  . Alcohol Use: Yes     Comment: occasional     Colonoscopy: -  PAP: 09/2010  Bone density: -  Lipid panel:  Allergies  Allergen Reactions  . Codeine Itching  . Latex Swelling    Current Outpatient Prescriptions  Medication Sig Dispense Refill  . meclizine (ANTIVERT) 50 MG tablet Take 50 mg by mouth 3 (three) times daily as needed. dizziness      . mometasone (NASONEX) 50 MCG/ACT nasal spray Place 2 sprays into the nose daily.      [name] oxyCODONE-acetaminophen (PERCOCET) 7.5-325 MG per tablet Take 1 tablet by mouth every 4 (four) hours as needed for pain.  30 tablet  0   No current facility-administered medications for this visit.    OBJECTIVE: Young-appearing African American woman in no acute distress Filed Vitals:   08/03/12 0902  BP: 125/87  Pulse: 102  Temp: 98.5 F (36.9 C)  Resp: 20     Body mass index is 24 kg/(m^2).    ECOG FS: 2  Sclerae unicteric Oropharynx clear No cervical or supraclavicular adenopathy Lungs no rales or rhonchi Heart regular rate and rhythm Abd benign MSK no focal spinal tenderness, no peripheral edema Neuro: nonfocal Breasts: She status post bilateral mastectomies. The incisions are healing very nicely. There is no dehiscence, swelling, or erythema.  Both axillae are benign.   LAB RESULTS: Lab Results  Component Value Date   WBC 11.0* 07/15/2012   NEUTROABS 11.9* 07/14/2012   HGB 12.0 07/15/2012   HCT 34.6* 07/15/2012   MCV 86.5 07/15/2012   PLT 232 07/15/2012      Chemistry      Component Value Date/Time   NA 135 07/15/2012 0500   NA 141 06/28/2012 0819   K 3.9 07/15/2012 0500   K 3.7 06/28/2012 0819   CL 103 07/15/2012 0500   CL 106 06/28/2012 0819   CO2 25 07/15/2012 0500   CO2 25 06/28/2012 0819   BUN 7 07/15/2012 0500   BUN 9.0 06/28/2012 0819   CREATININE 0.62 07/15/2012 0500   CREATININE 1.0 06/28/2012 0819   CREATININE 0.80 05/02/2012 1412      Component Value Date/Time   CALCIUM 8.2* 07/15/2012 0500   CALCIUM 9.4 06/28/2012 0819   ALKPHOS 46 07/14/2012 2300   ALKPHOS 61 06/28/2012 0819   AST 14 07/14/2012 2300   AST 16 06/28/2012 0819   ALT 7 07/14/2012 2300  ALT 7 06/28/2012 0819   BILITOT 0.3 07/14/2012 2300   BILITOT 0.46 06/28/2012 0819       Lab Results  Component Value Date   LABCA2 41* 06/28/2012    No components found with this basename: [id number]    No results found for this basename: INR,  in the last 168 hours  Urinalysis No results found for this basename: colorurine,  appearanceur,  labspec,  phurine,  glucoseu,  hgbur,  bilirubinur,  ketonesur,  proteinur,  urobilinogen,  nitrite,  leukocytesur    STUDIES:  Echocardiogram 08/01/2012 showed an ejection fraction in the 60-65% range.  Chest 2 View  07/13/2012  *RADIOLOGY REPORT*  Clinical Data: Preoperative respiratory films.  CHEST - 2 VIEW  Comparison: CT chest 07/07/2012  Findings: The lungs are clear.  Heart size is normal.  No pneumothorax or pleural effusion.  IMPRESSION: Negative chest.   Original Report Authenticated By: Holley Dexter, M.D.    Nm Sentinel Node Inj-no Rpt (breast)  07/14/2012  CLINICAL DATA: right breast cancer   Sulfur colloid was injected intradermally by the nuclear medicine  technologist for breast cancer sentinel node localization.      Dg Chest Port 1 View  07/14/2012  *RADIOLOGY REPORT*  Clinical Data: History of breast cancer, post port catheter placement in the operating room  PORTABLE CHEST - 1 VIEW  Comparison: 07/13/2012  Findings:  Grossly unchanged cardiac silhouette and mediastinal contours. Surgical clips overlie the left hilum and may be external to the patient.  Interval placement of a left anterior chest wall subclavian vein approach Port-A-Cath with tip overlying the superior cavoatrial junction.  No pneumothorax.  No focal airspace opacity or pleural effusion.  There is a minimal amount of subcutaneous emphysema within the bilateral lateral chest walls. Several surgical drains overlie the right axilla.  Right axillary surgical clips. Unchanged bones.  IMPRESSION: 1.  Post left subclavian vein approach port catheter with tip overlying the superior cavoatrial junction.  No pneumothorax. 2.  Postsurgical change of the right axilla.  3.  No acute cardiopulmonary disease.   Original Report Authenticated By: Tacey Ruiz, MD    Dg Fluoro Guide Cv Line-no Report  07/14/2012  CLINICAL DATA: port-a-cath   FLOURO GUIDE CV LINE  Fluoroscopy was utilized by the requesting physician.  No radiographic  interpretation.         ASSESSMENT: 42 y.o. [place] woman status post right mastectomy and sentinel lymph node sampling 07/14/2012 (with simple left mastectomy)  for a pT2, pN1, stage IIB invasive ductal carcinoma, grade 3, estrogen receptor 100% and progesterone receptor 100% positive, with an MIB-1 of 48%, and HER-2 amplified by CISH with a ratio of 3.02.  (1) to start adjuvant chemotherapy 08/17/2012, consisting of carboplatin, docetaxel and trastuzumab given every 3 weeks for 6 cycles  (2) trastuzumab to be continued in to March of 2015. Most recent echocardiogram 08/01/2012  PLAN: We went over her pathology, and she understands the need for adjuvant chemotherapy. We went over the possible toxicities, side effects, and  complications of treatment, and of course she really has her port in place. She received dexamethasone, ondansetron, lorazepam, and prochlorperazine prescriptions, as well as the prescription for EMLA cream and given a history of viral ulcers also for Valtrex 500 mg daily. She also received a "map" of how to take these medications. I am making her an appointment with my physician's assistant Zollie Scale on March 5 to go over these one more time and particularly to make sure  she remembers to start the dexamethasone the day before therapy. The patient knows to call for any problems that may develop before the next visit. Lamika Connolly C    08/03/2012

## 2012-08-07 ENCOUNTER — Telehealth: Payer: Self-pay | Admitting: Oncology

## 2012-08-07 NOTE — Telephone Encounter (Signed)
S/w pt re appt for 3/5.

## 2012-08-10 ENCOUNTER — Encounter (HOSPITAL_COMMUNITY): Payer: Self-pay

## 2012-08-10 ENCOUNTER — Ambulatory Visit (HOSPITAL_COMMUNITY)
Admission: RE | Admit: 2012-08-10 | Discharge: 2012-08-10 | Disposition: A | Discharge status: Home / Self Care | Payer: BC Managed Care – PPO | Source: Ambulatory Visit | Attending: Internal Medicine | Admitting: Internal Medicine

## 2012-08-10 VITALS — BP 128/96 | HR 103 | Wt 134.8 lb

## 2012-08-10 DIAGNOSIS — C50219 Malignant neoplasm of upper-inner quadrant of unspecified female breast: Secondary | ICD-10-CM | POA: Insufficient documentation

## 2012-08-10 NOTE — Assessment & Plan Note (Addendum)
Have explained the role of the cardio-onc clinic and reviewed echo in full.  Patient is clear to start herceptin therapy at this time.  Will follow closely with echos every 3 months.    Patient seen and examined with Ulyess Blossom, PA-C. We discussed all aspects of the encounter. I agree with the assessment and plan as stated above.  Risk of trastuzamab cardiotoxicity and role of cardio-oncology clinic reviewed in detail. Echo reviewed personally. She is OK to start Herceptin. Will follow every 3 months.

## 2012-08-10 NOTE — Progress Notes (Signed)
Referring Physician: Primary Care: Primary Cardiologist:  HPI: Charrisse is a 42 y.o. 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  She is s/p bilateral mastectomy 07/14/12.  She is followed by Dr. Darnelle Catalan and will start adjuvant chemotherapy on 08/17/12 consisting of carboplatin, docetaxel and trastuzumab given every 3 weeks for 6 cycles.  She will then continue trastuzumab for 1 year.    Echo: 08/01/12: EF 60-65% Lat s' 11.4  She has been referred to the cardio-onc clinic.  She feels well but anxious about starting therapy.  She denies orthopnea, PND or edema.  No chest pain.  Lives at home with husband and 2 boys.     Review of Systems: [y] = yes, [ ]  = no   General: Weight gain [ ] ; Weight loss [ ] ; Anorexia [ ] ; Fatigue [ ] ; Fever [ ] ; Chills [ ] ; Weakness [ ]   Cardiac: Chest pain/pressure [ ] ; Resting SOB [ ] ; Exertional SOB [ ] ; Orthopnea [ ] ; Pedal Edema [ ] ; Palpitations [ ] ; Syncope [ ] ; Presyncope [ ] ; Paroxysmal nocturnal dyspnea[ ]   Pulmonary: Cough [ ] ; Wheezing[ ] ; Hemoptysis[ ] ; Sputum [ ] ; Snoring [ ]   GI: Vomiting[ ] ; Dysphagia[ ] ; Melena[ ] ; Hematochezia [ ] ; Heartburn[ ] ; Abdominal pain [ ] ; Constipation [ ] ; Diarrhea [ ] ; BRBPR [ ]   GU: Hematuria[ ] ; Dysuria [ ] ; Nocturia[ ]   Vascular: Pain in legs with walking [ ] ; Pain in feet with lying flat [ ] ; Non-healing sores [ ] ; Stroke [ ] ; TIA [ ] ; Slurred speech [ ] ;  Neuro: Headaches[ ] ; Vertigo[ ] ; Seizures[ ] ; Paresthesias[ ] ;Blurred vision [ ] ; Diplopia [ ] ; Vision changes [ ]   Ortho/Skin: Arthritis [ ] ; Joint pain [ ] ; Muscle pain [ ] ; Joint swelling [ ] ; Back Pain [ ] ; Rash [ ]   Psych: Depression[ ] ; Anxiety[ ]   Heme: Bleeding problems [ ] ; Clotting disorders [ ] ; Anemia [ ]   Endocrine: Diabetes [ ] ; Thyroid dysfunction[ ]    Past Medical History  Diagnosis Date  . Allergy   . Migraine   . Migraine   . Breast cancer   . History of migraine     last one about a  week ago    Current Outpatient Prescriptions  Medication Sig Dispense Refill  . dexamethasone (DECADRON) 4 MG tablet Take 2 tablets (8 mg total) by mouth 2 (two) times daily with a meal. Take two times a day the day before Taxotere. Then take two times a day starting the day after chemo for 3 days.  30 tablet  1  . lidocaine-prilocaine (EMLA) cream Apply topically as needed. Apply to port 1-2 hours before chemo and cover with plastic wrap  30 g  0  . LORazepam (ATIVAN) 0.5 MG tablet Take 1 tablet (0.5 mg total) by mouth every 6 (six) hours as needed (Nausea or vomiting).  30 tablet  0  . meclizine (ANTIVERT) 50 MG tablet Take 50 mg by mouth 3 (three) times daily as needed. dizziness      . mometasone (NASONEX) 50 MCG/ACT nasal spray Place 2 sprays into the nose daily.      . ondansetron (ZOFRAN) 8 MG tablet Take 1 tablet (8 mg total) by mouth 2 (two) times daily. Take two times a day starting the day after chemo for 3 days. Then take two times a day as needed for nausea or vomiting.  30 tablet  1  . oxyCODONE-acetaminophen (PERCOCET) 7.5-325 MG  per tablet Take 1 tablet by mouth every 4 (four) hours as needed for pain.  30 tablet  0  . prochlorperazine (COMPAZINE) 10 MG tablet Take 1 tablet (10 mg total) by mouth every 6 (six) hours as needed (Nausea or vomiting).  30 tablet  1  . prochlorperazine (COMPAZINE) 25 MG suppository Place 1 suppository (25 mg total) rectally every 12 (twelve) hours as needed for nausea.  12 suppository  3  . valACYclovir (VALTREX) 500 MG tablet Take 1 tablet (500 mg total) by mouth 2 (two) times daily.  30 tablet  3   No current facility-administered medications for this encounter.    Allergies  Allergen Reactions  . Codeine Itching  . Latex Swelling    History   Social History  . Marital Status: Married    Spouse Name: N/A    Number of Children: N/A  . Years of Education: N/A   Occupational History  . Not on file.   Social History Main Topics  .  Smoking status: Never Smoker   . Smokeless tobacco: Not on file  . Alcohol Use: Yes     Comment: occasional  . Drug Use: No  . Sexually Active: Yes    Birth Control/ Protection: None   Other Topics Concern  . Not on file   Social History Narrative  . No narrative on file    Family History  Problem Relation Age of Onset  . Hypertension Mother   . Alcohol abuse Mother   . Heart disease Maternal Grandmother   . Stroke Maternal Grandfather   . Colon cancer Maternal Aunt   . Brain cancer Paternal Uncle     PHYSICAL EXAM: Filed Vitals:   08/10/12 1101  BP: 128/96  Pulse: 103  Weight: 134 lb 12.8 oz (61.145 kg)  SpO2: 99%    General:  Well appearing. No respiratory difficulty HEENT: normal Neck: supple. no JVD. Carotids 2+ bilat; no bruits. No lymphadenopathy or thryomegaly appreciated. Cor: PMI nondisplaced. Regular rate & rhythm. No rubs, gallops or murmurs. Lungs: clear Abdomen: soft, nontender, nondistended. No hepatosplenomegaly. No bruits or masses. Good bowel sounds. Extremities: no cyanosis, clubbing, rash, edema Neuro: alert & oriented x 3, cranial nerves grossly intact. moves all 4 extremities w/o difficulty. Affect pleasant.     ASSESSMENT & PLAN:

## 2012-08-16 ENCOUNTER — Ambulatory Visit (HOSPITAL_BASED_OUTPATIENT_CLINIC_OR_DEPARTMENT_OTHER): Payer: BC Managed Care – PPO | Admitting: Physician Assistant

## 2012-08-16 ENCOUNTER — Encounter: Payer: Self-pay | Admitting: Physician Assistant

## 2012-08-16 ENCOUNTER — Telehealth: Payer: Self-pay | Admitting: Oncology

## 2012-08-16 VITALS — BP 139/92 | HR 108 | Temp 98.5°F | Resp 20 | Ht 62.5 in | Wt 135.9 lb

## 2012-08-16 DIAGNOSIS — Z170 Estrogen receptor positive status [ER+]: Secondary | ICD-10-CM

## 2012-08-16 DIAGNOSIS — C50219 Malignant neoplasm of upper-inner quadrant of unspecified female breast: Secondary | ICD-10-CM

## 2012-08-16 DIAGNOSIS — F411 Generalized anxiety disorder: Secondary | ICD-10-CM

## 2012-08-16 DIAGNOSIS — C50211 Malignant neoplasm of upper-inner quadrant of right female breast: Secondary | ICD-10-CM

## 2012-08-16 NOTE — Progress Notes (Signed)
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 At that time it digital diagnostic mammography showed pleomorphic calcifications extending over 9 cm in the inner third of the right breast. There were no suspicious calcifications in the left breast. On exam, Dr. Lyman Bishop was able to palpate a small freely mobile mass in the lower inner left breast. Ultrasound showed a horizontally oriented hypoechoic macrolobulated mass in the left breast measuring approximately 1.3 cm. 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  Bilateral breast MRI 06/23/2012 showed an enhancing mass in the upper central right breast measuring 1.8 cm, together with clumped nodular enhancement extending for a total area of 11.4 cm. In the upper outer quadrant of this breast there were 3 discrete irregular masses measuring up to 3 cm. This correlated well with the ultrasound findings previously. In the left breast there was a large area of clumped nodular enhancement measuring up to 8.9 cm. In the lower central portion there was a 1.7 cm circumscribed nodule consistent with a known fibroadenoma. There weas no axillary or internal mammary adenopathy noted on either side. The patient's subsequent history is as detailed below  INTERVAL HISTORY: [place] returns today  for followup of her right breast cancer. She is due to initiate her first of 6 planned adjuvant cycles of docetaxel/carboplatin/trastuzumab tomorrow. She has attended chemotherapy  class, and had her port placed with no complications. She has all of her antinausea medications on hand, and was able to voiced understanding of how to utilize these appropriately.  Although she is a little anxious about beginning chemotherapy, physically Jenan is feeling well.   REVIEW OF SYSTEMS: Jenifer has had no fevers or chills. She denies any rashes. She's had no abnormal bleeding. She's eating and drinking well no nausea no change in bowel or bladder habits. She denies any cough, phlegm production, shortness of breath, chest pain, or palpitations. She denies any abnormal headaches or dizziness. She's had no peripheral swelling, but does have some discomfort in the right upper extremity, including the right axilla. At baseline, she denies any signs of peripheral neuropathy.  A detailed review of systems is otherwise stable and noncontributory.   PAST MEDICAL HISTORY: Past Medical History  Diagnosis Date  . Allergy   . Migraine   . Migraine   . Breast cancer   . History of migraine     last one about a week ago    PAST SURGICAL HISTORY: Past Surgical History  Procedure Laterality Date  . Abdominal hysterectomy      no salpingo-oophorectomy  . Simple mastectomy with axillary sentinel node biopsy  07/14/2012    Procedure: SIMPLE MASTECTOMY WITH AXILLARY SENTINEL NODE BIOPSY;  Surgeon: Clovis Pu. Cornett, MD;  Location: [facility] OR;  Service: General;  Laterality: Right;  Bilateral simple mastectomy with port and right sebtibel lymph node mapping  . Simple mastectomy with axillary sentinel node biopsy  07/14/2012    Procedure: SIMPLE MASTECTOMY;  Surgeon: Clovis Pu. Cornett, MD;  Location: [facility] OR;  Service: General;  Laterality: Left;  . Portacath placement  07/14/2012    Procedure: INSERTION PORT-A-CATH;  Surgeon: Clovis Pu. Cornett, MD;  Location: [facility] OR;  Service: General;  Laterality: Left;    FAMILY HISTORY Family History  Problem Relation Age of Onset  . Hypertension Mother   . Alcohol  abuse Mother   . Heart disease Maternal Grandmother   . Stroke Maternal Grandfather   . Colon cancer Maternal Aunt   . Brain cancer Paternal Uncle    the patient's father died in an automobile accident in his early 48s. The patient's mother is living, currently age 94. The patient has one brother and one sister. There is no family history of breast or ovarian cancer, although there are maternal uncles with brain, colon and lymph node cancers.  GYNECOLOGIC HISTORY: Menarche age 5, first live birth age 83, she is GX P2. She stopped having periods with her surgery March of 2009. She does have "premenstrual symptoms" on a regular basis.  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 Her husband Brand Males works for a company in the [place] doing testing. Their children Jaden (9) and Bryson (4) at home.   ADVANCED DIRECTIVES: not in place  HEALTH MAINTENANCE: History  Substance Use Topics  . Smoking status: Never Smoker   . Smokeless tobacco: Not on file  . Alcohol Use: Yes     Comment: occasional     Colonoscopy: -  PAP: 09/2010  Bone density: -  Lipid panel:  Allergies  Allergen Reactions  . Codeine Itching  . Latex Swelling    Current Outpatient Prescriptions  Medication Sig Dispense Refill  . dexamethasone (DECADRON) 4 MG tablet Take 2 tablets (8 mg total) by mouth 2 (two) times daily with a meal. Take two times a day the day before Taxotere. Then take two times a day starting the day after chemo for 3 days.  30 tablet  1  . lidocaine-prilocaine (EMLA) cream Apply topically as needed. Apply to port 1-2 hours before chemo and cover with plastic wrap  30 g  0  . LORazepam (ATIVAN) 0.5 MG tablet Take 1 tablet (0.5 mg total) by mouth every 6 (six) hours as needed (Nausea or vomiting).  30 tablet  0  . ondansetron (ZOFRAN) 8 MG tablet Take 1 tablet (8 mg total) by mouth 2 (two) times daily. Take two times a day starting  the day after chemo for 3 days. Then take two times a day as needed for nausea or vomiting.  30 tablet  1  . prochlorperazine (COMPAZINE) 10 MG tablet Take 1 tablet (10 mg total) by mouth every 6 (six) hours as needed (Nausea or vomiting).  30 tablet  1  . prochlorperazine (COMPAZINE) 25 MG suppository Place 1 suppository (25 mg total) rectally every 12 (twelve) hours as needed for nausea.  12 suppository  3  . valACYclovir (VALTREX) 500 MG tablet Take 1 tablet (500 mg total) by mouth 2 (two) times daily.  30 tablet  3  . mometasone (NASONEX) 50 MCG/ACT nasal spray Place 2 sprays into the nose daily.       No current facility-administered medications for this visit.    OBJECTIVE: Young-appearing African American woman in no acute distress Filed Vitals:   08/16/12 1300  BP: 139/92  Pulse: 108  Temp: 98.5 F (36.9 C)  Resp: 20     Body mass index is 24.44 kg/(m^2).    ECOG FS:  1 Filed Weights   08/16/12 1300  Weight: 135 lb 14.4 oz (61.644 kg)    Sclerae unicteric Oropharynx clear No cervical or supraclavicular adenopathy Lungs clear to auscultation, no rales or rhonchi Heart regular rate and rhythm Abdomen soft, nontender, positive bowel sounds MSK no focal spinal tenderness, no peripheral edema Neuro: nonfocal. Well oriented Breasts: Deferred. Both axillae are benign.   LAB RESULTS: Lab Results  Component Value Date   WBC 11.0* 07/15/2012   NEUTROABS 11.9* 07/14/2012   HGB 12.0 07/15/2012   HCT 34.6* 07/15/2012   MCV 86.5 07/15/2012   PLT 232 07/15/2012      Chemistry      Component Value Date/Time   NA 135 07/15/2012 0500   NA 141 06/28/2012 0819   K 3.9 07/15/2012 0500   K 3.7 06/28/2012 0819   CL 103 07/15/2012 0500   CL 106 06/28/2012 0819   CO2 25 07/15/2012 0500   CO2 25 06/28/2012 0819   BUN 7 07/15/2012 0500   BUN 9.0 06/28/2012 0819   CREATININE 0.62 07/15/2012 0500   CREATININE 1.0 06/28/2012 0819   CREATININE 0.80 05/02/2012 1412      Component Value Date/Time   CALCIUM  8.2* 07/15/2012 0500   CALCIUM 9.4 06/28/2012 0819   ALKPHOS 46 07/14/2012 2300   ALKPHOS 61 06/28/2012 0819   AST 14 07/14/2012 2300   AST 16 06/28/2012 0819   ALT 7 07/14/2012 2300   ALT 7 06/28/2012 0819   BILITOT 0.3 07/14/2012 2300   BILITOT 0.46 06/28/2012 0819       Lab Results  Component Value Date   LABCA2 41* 06/28/2012     STUDIES:  Echocardiogram 08/01/2012 showed an ejection fraction in the 60-65% range.        ASSESSMENT: 42 y.o. [place] woman status post right mastectomy and sentinel lymph node sampling 07/14/2012 (with simple left mastectomy)  for a pT2, pN1, stage IIB invasive ductal carcinoma, grade 3, estrogen receptor 100% and progesterone receptor 100% positive, with an MIB-1 of 48%, and HER-2 amplified by CISH with a ratio of 3.02.  (1) to start adjuvant chemotherapy 08/17/2012, consisting of carboplatin, docetaxel and trastuzumab given every 3 weeks for 6 cycles  (2) trastuzumab to be continued in to March of 2015. Most recent echocardiogram 08/01/2012  PLAN:  Over half of our 40 minute appointment today was spent reviewing the patient's treatment plan, discussing her antinausea regimen, answering her questions, and coordinating care. She is ready to initiate day 1 cycle 1 of 6 planned q. three-week doses of adjuvant docetaxel/carboplatin/trastuzumab tomorrow, March 6. She received Neulasta on day 2, and I will see her again next week for assessment of chemotoxicity. We reviewed all of her anti-nausea medications and she has all of his instructions in writing. She did start oral dexamethasone today as previously instructed.  Jeannine knows to call with any changes or problems.  BERRY,AMY    08/16/2012

## 2012-08-16 NOTE — Telephone Encounter (Signed)
gv pt appt schedule for March thru May. °

## 2012-08-17 ENCOUNTER — Ambulatory Visit (HOSPITAL_BASED_OUTPATIENT_CLINIC_OR_DEPARTMENT_OTHER): Payer: BC Managed Care – PPO

## 2012-08-17 VITALS — BP 149/99 | HR 98 | Temp 98.6°F | Resp 16

## 2012-08-17 DIAGNOSIS — Z5111 Encounter for antineoplastic chemotherapy: Secondary | ICD-10-CM

## 2012-08-17 DIAGNOSIS — C50219 Malignant neoplasm of upper-inner quadrant of unspecified female breast: Secondary | ICD-10-CM

## 2012-08-17 DIAGNOSIS — Z5112 Encounter for antineoplastic immunotherapy: Secondary | ICD-10-CM

## 2012-08-17 MED ORDER — ONDANSETRON 16 MG/50ML IVPB (CHCC)
16.0000 mg | Freq: Once | INTRAVENOUS | Status: AC
  Administered 2012-08-17: 16 mg via INTRAVENOUS

## 2012-08-17 MED ORDER — ACETAMINOPHEN 325 MG PO TABS
650.0000 mg | ORAL_TABLET | Freq: Once | ORAL | Status: AC
  Administered 2012-08-17: 650 mg via ORAL

## 2012-08-17 MED ORDER — TRASTUZUMAB CHEMO INJECTION 440 MG
6.0000 mg/kg | Freq: Once | INTRAVENOUS | Status: AC
  Administered 2012-08-17: 357 mg via INTRAVENOUS
  Filled 2012-08-17: qty 17

## 2012-08-17 MED ORDER — DOCETAXEL CHEMO INJECTION 160 MG/16ML
75.0000 mg/m2 | Freq: Once | INTRAVENOUS | Status: AC
  Administered 2012-08-17: 120 mg via INTRAVENOUS
  Filled 2012-08-17: qty 12

## 2012-08-17 MED ORDER — SODIUM CHLORIDE 0.9 % IV SOLN
Freq: Once | INTRAVENOUS | Status: AC
  Administered 2012-08-17: 12:00:00 via INTRAVENOUS

## 2012-08-17 MED ORDER — DIPHENHYDRAMINE HCL 25 MG PO CAPS
50.0000 mg | ORAL_CAPSULE | Freq: Once | ORAL | Status: AC
  Administered 2012-08-17: 50 mg via ORAL

## 2012-08-17 MED ORDER — SODIUM CHLORIDE 0.9 % IV SOLN
750.0000 mg | Freq: Once | INTRAVENOUS | Status: AC
  Administered 2012-08-17: 750 mg via INTRAVENOUS
  Filled 2012-08-17: qty 75

## 2012-08-17 MED ORDER — DEXAMETHASONE SODIUM PHOSPHATE 4 MG/ML IJ SOLN
20.0000 mg | Freq: Once | INTRAMUSCULAR | Status: AC
  Administered 2012-08-17: 20 mg via INTRAVENOUS

## 2012-08-17 MED ORDER — SODIUM CHLORIDE 0.9 % IJ SOLN
10.0000 mL | INTRAMUSCULAR | Status: DC | PRN
  Administered 2012-08-17: 10 mL
  Filled 2012-08-17: qty 10

## 2012-08-17 MED ORDER — HEPARIN SOD (PORK) LOCK FLUSH 100 UNIT/ML IV SOLN
500.0000 [IU] | Freq: Once | INTRAVENOUS | Status: AC | PRN
  Administered 2012-08-17: 500 [IU]
  Filled 2012-08-17: qty 5

## 2012-08-17 NOTE — Patient Instructions (Addendum)
[facility] Discharge Instructions for Patients Receiving Chemotherapy  Today you received the following chemotherapy agents Taxotere/Carboplatin/Herceptin To help prevent nausea and vomiting after your treatment, we encourage you to take your nausea medication as prescribed.  If you develop nausea and vomiting that is not controlled by your nausea medication, call the clinic. If it is after clinic hours your family physician or the after hours number for the clinic or go to the Emergency Department.   BELOW ARE SYMPTOMS THAT SHOULD BE REPORTED IMMEDIATELY:  *FEVER GREATER THAN 100.5 F  *CHILLS WITH OR WITHOUT FEVER  NAUSEA AND VOMITING THAT IS NOT CONTROLLED WITH YOUR NAUSEA MEDICATION  *UNUSUAL SHORTNESS OF BREATH  *UNUSUAL BRUISING OR BLEEDING  TENDERNESS IN MOUTH AND THROAT WITH OR WITHOUT PRESENCE OF ULCERS  *URINARY PROBLEMS  *BOWEL PROBLEMS  UNUSUAL RASH Items with * indicate a potential emergency and should be followed up as soon as possible.  One of the nurses will contact you 24 hours after your treatment. Please let the nurse know about any problems that you may have experienced. Feel free to call the clinic you have any questions or concerns. The clinic phone number is [phone].   I have been informed and understand all the instructions given to me. I know to contact the clinic, my physician, or go to the Emergency Department if any problems should occur. I do not have any questions at this time, but understand that I may call the clinic during office hours   should I have any questions or need assistance in obtaining follow up care.    __________________________________________  _____________  __________ Signature of Patient or Authorized Representative            Date                   Time    __________________________________________ Nurse's Signature    

## 2012-08-18 ENCOUNTER — Telehealth: Payer: Self-pay | Admitting: *Deleted

## 2012-08-18 ENCOUNTER — Ambulatory Visit (HOSPITAL_BASED_OUTPATIENT_CLINIC_OR_DEPARTMENT_OTHER): Payer: BC Managed Care – PPO

## 2012-08-18 VITALS — BP 145/92 | HR 96 | Temp 99.1°F

## 2012-08-18 DIAGNOSIS — Z5189 Encounter for other specified aftercare: Secondary | ICD-10-CM

## 2012-08-18 DIAGNOSIS — Z170 Estrogen receptor positive status [ER+]: Secondary | ICD-10-CM

## 2012-08-18 DIAGNOSIS — C50219 Malignant neoplasm of upper-inner quadrant of unspecified female breast: Secondary | ICD-10-CM

## 2012-08-18 MED ORDER — PEGFILGRASTIM INJECTION 6 MG/0.6ML
6.0000 mg | Freq: Once | SUBCUTANEOUS | Status: AC
  Administered 2012-08-18: 6 mg via SUBCUTANEOUS
  Filled 2012-08-18: qty 0.6

## 2012-08-18 NOTE — Telephone Encounter (Signed)
Annya here for Neulasta injection following 1st tch chemo treatment.  She states that she is doing good.  Drinking fluids and eating without difficulty.  No nausea, vomiting, or diarrhea.  All questions answered.  Knows to call the clinic if she has and problems or concerns.

## 2012-08-20 ENCOUNTER — Encounter: Payer: Self-pay | Admitting: Oncology

## 2012-08-21 ENCOUNTER — Other Ambulatory Visit (HOSPITAL_COMMUNITY): Payer: BC Managed Care – PPO

## 2012-08-21 ENCOUNTER — Ambulatory Visit (HOSPITAL_COMMUNITY): Payer: BC Managed Care – PPO

## 2012-08-22 ENCOUNTER — Other Ambulatory Visit: Payer: Self-pay | Admitting: *Deleted

## 2012-08-22 DIAGNOSIS — C50219 Malignant neoplasm of upper-inner quadrant of unspecified female breast: Secondary | ICD-10-CM

## 2012-08-23 ENCOUNTER — Encounter: Payer: Self-pay | Admitting: Physician Assistant

## 2012-08-23 ENCOUNTER — Other Ambulatory Visit (HOSPITAL_BASED_OUTPATIENT_CLINIC_OR_DEPARTMENT_OTHER): Payer: BC Managed Care – PPO | Admitting: Lab

## 2012-08-23 ENCOUNTER — Ambulatory Visit (HOSPITAL_BASED_OUTPATIENT_CLINIC_OR_DEPARTMENT_OTHER): Payer: BC Managed Care – PPO | Admitting: Physician Assistant

## 2012-08-23 VITALS — BP 130/92 | HR 114 | Temp 98.5°F | Resp 20 | Ht 62.5 in | Wt 132.9 lb

## 2012-08-23 DIAGNOSIS — C50219 Malignant neoplasm of upper-inner quadrant of unspecified female breast: Secondary | ICD-10-CM

## 2012-08-23 DIAGNOSIS — Z170 Estrogen receptor positive status [ER+]: Secondary | ICD-10-CM

## 2012-08-23 LAB — COMPREHENSIVE METABOLIC PANEL (CC13)
AST: 17 U/L (ref 5–34)
Alkaline Phosphatase: 76 U/L (ref 40–150)
BUN: 15.2 mg/dL (ref 7.0–26.0)
Creatinine: 0.9 mg/dL (ref 0.6–1.1)
Glucose: 93 mg/dl (ref 70–99)
Total Bilirubin: 0.37 mg/dL (ref 0.20–1.20)

## 2012-08-23 LAB — CBC WITH DIFFERENTIAL/PLATELET
Basophils Absolute: 0 10*3/uL (ref 0.0–0.1)
EOS%: 2.3 % (ref 0.0–7.0)
Eosinophils Absolute: 0.1 10*3/uL (ref 0.0–0.5)
HGB: 14.7 g/dL (ref 11.6–15.9)
LYMPH%: 46 % (ref 14.0–49.7)
MCH: 30.1 pg (ref 25.1–34.0)
MCV: 86.5 fL (ref 79.5–101.0)
MONO%: 16.1 % — ABNORMAL HIGH (ref 0.0–14.0)
NEUT%: 35.2 % — ABNORMAL LOW (ref 38.4–76.8)
Platelets: 174 10*3/uL (ref 145–400)
RDW: 12.2 % (ref 11.2–14.5)

## 2012-08-23 NOTE — Progress Notes (Signed)
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 At that time a digital bilateal diagnostic mammogram showed a slight decrease in size of the benign fibroadenoma in the left breast compared to 2010. The same mammogram, however, also refilled pleomorphic calcifications extending over 9 cm in the inner third of the right breast. There were no suspicious calcifications in the left breast. On exam, Dr. Lyman Bishop was able to palpate a small freely mobile mass in the lower inner left breast, but was unable to palpate any abnormalities in the right breast other than some focal thickening.  An subsequent ultrasound of the right breast on 06/16/2012 showed multiple hypoechoic masses throughout the upper quadrants of the right breast. The palpable mass in the 1:00 location corresponded with an irregular hypoechoic mass measuring 1.7 x 1.4 cm. There was also a discrete hypoechoic lobulated mass at the 11:00 position measuring 2.1 x 1.1 x 2.1 cm. The third lesion identified at the 11:00 location, 3 cm from the nipple, measured 1.0 x 0.8 x 0.9 cm.   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  Bilateral breast MRI 06/23/2012 showed an enhancing mass in the upper central right breast measuring 1.8 cm, together with clumped nodular enhancement extending for a total area of 11.4 cm. In the upper outer quadrant of this breast there were 3 discrete  irregular masses measuring up to 3 cm. This correlated well with the ultrasound findings previously. In the left breast there was a large area of clumped nodular enhancement measuring up to 8.9 cm. In the lower central portion there was a 1.7 cm circumscribed nodule consistent with a known fibroadenoma. There weas no axillary or internal mammary adenopathy noted on either side.   The 8.9 cm area of abnormality in the left breast was biopsied on 06/30/2012 showing fibrocystic changes with adenosis and calcifications, pseudoangiomatous stromal hyperplasia, but no evidence of malignancy.  The patient underwent bilateral mastectomies under the care of Dr. Luisa Hart on 07/14/2012. The left mastectomy revealed benign breast tissue and was negative for malignancy. 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 Lymphovascular invasion was identified; #2) a ductal carcinoma in situ, grade 3, with comedo necrosis.  Surgical margins were negative. 4 lymph nodes were examined, one positive for metastatic mammary carcinoma, a second positive for isolated tumor cells. pT2, pN1a  Subsequent history is detailed below.  INTERVAL HISTORY: Sherry Barry returns today  for followup of her right breast cancer. She is currently day 8 cycle 1 of 6 planned adjuvant cycles of docetaxel/carboplatin/trastuzumab. She received Neulasta on day 2 for granulocyte support.  Overall, Sherry Barry feels that she tolerated chemotherapy well. She's had some blood in the mucus when she blows her nose, but no actual epistasis. Her mouth is dry, but she denies any ulcerations or sensitivity. She's had no signs whatsoever of numbness  or tingling in the extremities. She also denies any problems with nausea or emesis.   REVIEW OF SYSTEMS: Sherry Barry has had no fevers or chills. She denies any rashes. She's had no abnormal bleeding. She's eating and drinking well.  She's alternated between mild constipation and loose stools, but is managing to have bowel movements on a daily basis. She denies any cough, phlegm production, shortness of breath, chest pain, or palpitations. She denies any abnormal headaches or dizziness. She's had no peripheral swelling, but does have some postsurgical discomfort in the right upper extremity, including the right axilla.   A detailed review of systems is otherwise stable and noncontributory.   PAST MEDICAL HISTORY: Past Medical History  Diagnosis Date  . Allergy   . Migraine   . Migraine   . Breast cancer   . History of migraine     last one about a week ago    PAST SURGICAL HISTORY: Past Surgical History  Procedure Laterality Date  . Abdominal hysterectomy      no salpingo-oophorectomy  . Simple mastectomy with axillary sentinel node biopsy  07/14/2012    Procedure: SIMPLE MASTECTOMY WITH AXILLARY SENTINEL NODE BIOPSY;  Surgeon: Clovis Pu. Cornett, MD;  Location: [facility] OR;  Service: General;  Laterality: Right;  Bilateral simple mastectomy with port and right sebtibel lymph node mapping  . Simple mastectomy with axillary sentinel node biopsy  07/14/2012    Procedure: SIMPLE MASTECTOMY;  Surgeon: Clovis Pu. Cornett, MD;  Location: [facility] OR;  Service: General;  Laterality: Left;  . Portacath placement  07/14/2012    Procedure: INSERTION PORT-A-CATH;  Surgeon: Clovis Pu. Cornett, MD;  Location: [facility] OR;  Service: General;  Laterality: Left;    FAMILY HISTORY Family History  Problem Relation Age of Onset  . Hypertension Mother   . Alcohol abuse Mother   . Heart disease Maternal Grandmother   . Stroke Maternal Grandfather   . Colon cancer Maternal Aunt   . Brain cancer Paternal Uncle    the patient's father died in an automobile accident in his early 51s. The patient's mother is living, currently age 72. The patient has one brother and one sister. There is no family history of breast or ovarian cancer, although there are  maternal uncles with brain, colon and lymph node cancers.  GYNECOLOGIC HISTORY: Menarche age 69, first live birth age 72, she is GX P2. She stopped having periods with her surgery March of 2009. She does have "premenstrual symptoms" on a regular basis.  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 Her husband Brand Males works for a company in the [place] doing testing. Their children Jaden (9) and Bryson (4) at home.   ADVANCED DIRECTIVES: not in place  HEALTH MAINTENANCE: History  Substance Use Topics  . Smoking status: Never Smoker   . Smokeless tobacco: Not on file  . Alcohol Use: Yes     Comment: occasional     Colonoscopy: -  PAP: 09/2010  Bone density: -  Lipid panel:  Allergies  Allergen Reactions  . Codeine Itching  . Latex Swelling    Current Outpatient Prescriptions  Medication Sig Dispense Refill  . dexamethasone (DECADRON) 4 MG tablet Take 2 tablets (8 mg total) by mouth 2 (two) times daily with a meal. Take two times a day the day before Taxotere. Then take two times a day starting the day after chemo for 3 days.  30 tablet  1  . lidocaine-prilocaine (EMLA)  cream Apply topically as needed. Apply to port 1-2 hours before chemo and cover with plastic wrap  30 g  0  . LORazepam (ATIVAN) 0.5 MG tablet Take 1 tablet (0.5 mg total) by mouth every 6 (six) hours as needed (Nausea or vomiting).  30 tablet  0  . mometasone (NASONEX) 50 MCG/ACT nasal spray Place 2 sprays into the nose daily.      . ondansetron (ZOFRAN) 8 MG tablet Take 1 tablet (8 mg total) by mouth 2 (two) times daily. Take two times a day starting the day after chemo for 3 days. Then take two times a day as needed for nausea or vomiting.  30 tablet  1  . prochlorperazine (COMPAZINE) 10 MG tablet Take 1 tablet (10 mg total) by mouth every 6 (six) hours as needed (Nausea or vomiting).  30 tablet  1  . prochlorperazine (COMPAZINE) 25 MG suppository  Place 1 suppository (25 mg total) rectally every 12 (twelve) hours as needed for nausea.  12 suppository  3  . valACYclovir (VALTREX) 500 MG tablet Take 1 tablet (500 mg total) by mouth 2 (two) times daily.  30 tablet  3   No current facility-administered medications for this visit.    OBJECTIVE: Sherry Barry-appearing African American woman in no acute distress Filed Vitals:   08/23/12 1412  BP: 130/92  Pulse: 114  Temp: 98.5 F (36.9 C)  Resp: 20     Body mass index is 23.91 kg/(m^2).    ECOG FS: 1 Filed Weights   08/23/12 1412  Weight: 132 lb 14.4 oz (60.283 kg)    Sclerae unicteric Oropharynx clear No cervical or supraclavicular adenopathy Lungs clear to auscultation, no rales or rhonchi Heart regular rate and rhythm Abdomen soft, nontender, positive bowel sounds MSK no focal spinal tenderness, no peripheral edema Neuro: nonfocal. Well oriented Breasts: Deferred. Both axillae are benign.   LAB RESULTS: Lab Results  Component Value Date   WBC 3.4* 08/23/2012   NEUTROABS 1.2* 08/23/2012   HGB 14.7 08/23/2012   HCT 42.3 08/23/2012   MCV 86.5 08/23/2012   PLT 174 08/23/2012      Chemistry      Component Value Date/Time   NA 138 08/23/2012 1342   NA 135 07/15/2012 0500   K 3.6 08/23/2012 1342   K 3.9 07/15/2012 0500   CL 100 08/23/2012 1342   CL 103 07/15/2012 0500   CO2 29 08/23/2012 1342   CO2 25 07/15/2012 0500   BUN 15.2 08/23/2012 1342   BUN 7 07/15/2012 0500   CREATININE 0.9 08/23/2012 1342   CREATININE 0.62 07/15/2012 0500   CREATININE 0.80 05/02/2012 1412      Component Value Date/Time   CALCIUM 9.5 08/23/2012 1342   CALCIUM 8.2* 07/15/2012 0500   ALKPHOS 76 08/23/2012 1342   ALKPHOS 46 07/14/2012 2300   AST 17 08/23/2012 1342   AST 14 07/14/2012 2300   ALT 22 08/23/2012 1342   ALT 7 07/14/2012 2300   BILITOT 0.37 08/23/2012 1342   BILITOT 0.3 07/14/2012 2300       Lab Results  Component Value Date   LABCA2 41* 06/28/2012     STUDIES:  Echocardiogram 08/01/2012 showed an  ejection fraction in the 60-65% range.        ASSESSMENT: 42 y.o. Sherry Barry woman status post right mastectomy and sentinel lymph node sampling 07/14/2012 (with simple left mastectomy)  for a pT2, pN1, stage IIB invasive ductal carcinoma, grade 3, estrogen receptor 100% and progesterone receptor 100%  positive, with an MIB-1 of 48%, and HER-2 amplified by CISH with a ratio of 3.02.  (1) to start adjuvant chemotherapy 08/17/2012, consisting of carboplatin, docetaxel and trastuzumab given every 3 weeks for 6 cycles  (2) trastuzumab to be continued in to March of 2015. Most recent echocardiogram 08/01/2012  PLAN:  Over half of our 40 minute appointment today was spent reviewing the patient's treatment plan, discussing her antinausea regimen, answering her questions, and coordinating care. She is ready to initiate day 1 cycle 1 of 6 planned q. three-week doses of adjuvant docetaxel/carboplatin/trastuzumab tomorrow, March 6. She received Neulasta on day 2, and I will see her again next week for assessment of chemotoxicity. We reviewed all of her anti-nausea medications and she has all of his instructions in writing. She did start oral dexamethasone today as previously instructed.  Namine knows to call with any changes or problems.  BERRY,AMY    08/23/2012

## 2012-08-25 ENCOUNTER — Encounter (INDEPENDENT_AMBULATORY_CARE_PROVIDER_SITE_OTHER): Payer: Self-pay | Admitting: Surgery

## 2012-08-25 ENCOUNTER — Ambulatory Visit (INDEPENDENT_AMBULATORY_CARE_PROVIDER_SITE_OTHER): Payer: BC Managed Care – PPO | Admitting: Surgery

## 2012-08-25 VITALS — BP 110/68 | HR 106 | Temp 99.1°F | Resp 18 | Ht 62.5 in | Wt 131.8 lb

## 2012-08-25 DIAGNOSIS — Z9889 Other specified postprocedural states: Secondary | ICD-10-CM

## 2012-08-25 NOTE — Patient Instructions (Signed)
Return 6 months

## 2012-08-25 NOTE — Progress Notes (Signed)
42 y.o. [place] woman status post right mastectomy and sentinel lymph node sampling 07/14/2012 (with simple left mastectomy) for a pT2, pN1, stage IIB invasive ductal carcinoma, grade 3, estrogen receptor 100% and progesterone receptor 100% positive, with an MIB-1 of 48%, and HER-2 amplified by CISH with a ratio of 3.02.    She is doing well   Exam:   Bilateral mastectomy wounds clean dry intact without infection.  No seroma  Port site clean  Assessment:  42 y.o. [place] woman status post right mastectomy and sentinel lymph node sampling 07/14/2012 (with simple left mastectomy) for a pT2, pN1, stage IIB invasive ductal carcinoma, grade 3, estrogen receptor 100% and progesterone receptor 100% positive, with an MIB-1 of 48%, and HER-2 amplified by CISH with a ratio of 3.02.    Plan  : return 6 months

## 2012-09-06 ENCOUNTER — Other Ambulatory Visit: Payer: Self-pay | Admitting: Physician Assistant

## 2012-09-06 DIAGNOSIS — C50911 Malignant neoplasm of unspecified site of right female breast: Secondary | ICD-10-CM

## 2012-09-07 ENCOUNTER — Other Ambulatory Visit: Payer: Self-pay | Admitting: Oncology

## 2012-09-07 ENCOUNTER — Telehealth: Payer: Self-pay | Admitting: Oncology

## 2012-09-07 ENCOUNTER — Ambulatory Visit (HOSPITAL_BASED_OUTPATIENT_CLINIC_OR_DEPARTMENT_OTHER): Payer: BC Managed Care – PPO | Admitting: Physician Assistant

## 2012-09-07 ENCOUNTER — Other Ambulatory Visit (HOSPITAL_BASED_OUTPATIENT_CLINIC_OR_DEPARTMENT_OTHER): Payer: BC Managed Care – PPO | Admitting: Lab

## 2012-09-07 ENCOUNTER — Ambulatory Visit (HOSPITAL_BASED_OUTPATIENT_CLINIC_OR_DEPARTMENT_OTHER): Payer: BC Managed Care – PPO

## 2012-09-07 ENCOUNTER — Encounter: Payer: Self-pay | Admitting: Physician Assistant

## 2012-09-07 VITALS — BP 144/101 | HR 106 | Temp 98.3°F | Resp 20 | Ht 62.0 in | Wt 138.0 lb

## 2012-09-07 DIAGNOSIS — C50219 Malignant neoplasm of upper-inner quadrant of unspecified female breast: Secondary | ICD-10-CM

## 2012-09-07 DIAGNOSIS — C50211 Malignant neoplasm of upper-inner quadrant of right female breast: Secondary | ICD-10-CM

## 2012-09-07 DIAGNOSIS — J3489 Other specified disorders of nose and nasal sinuses: Secondary | ICD-10-CM

## 2012-09-07 DIAGNOSIS — Z5112 Encounter for antineoplastic immunotherapy: Secondary | ICD-10-CM

## 2012-09-07 DIAGNOSIS — C50911 Malignant neoplasm of unspecified site of right female breast: Secondary | ICD-10-CM

## 2012-09-07 DIAGNOSIS — Z170 Estrogen receptor positive status [ER+]: Secondary | ICD-10-CM

## 2012-09-07 DIAGNOSIS — I10 Essential (primary) hypertension: Secondary | ICD-10-CM

## 2012-09-07 LAB — CBC WITH DIFFERENTIAL/PLATELET
Basophils Absolute: 0 10*3/uL (ref 0.0–0.1)
EOS%: 0 % (ref 0.0–7.0)
Eosinophils Absolute: 0 10*3/uL (ref 0.0–0.5)
HCT: 35.6 % (ref 34.8–46.6)
HGB: 12.3 g/dL (ref 11.6–15.9)
MCH: 29.6 pg (ref 25.1–34.0)
MCV: 85.8 fL (ref 79.5–101.0)
MONO%: 12.9 % (ref 0.0–14.0)
NEUT#: 7.1 10*3/uL — ABNORMAL HIGH (ref 1.5–6.5)
NEUT%: 73 % (ref 38.4–76.8)
RDW: 12.5 % (ref 11.2–14.5)

## 2012-09-07 LAB — COMPREHENSIVE METABOLIC PANEL (CC13)
ALT: 16 U/L (ref 0–55)
AST: 18 U/L (ref 5–34)
Alkaline Phosphatase: 64 U/L (ref 40–150)
Glucose: 93 mg/dl (ref 70–99)
Sodium: 142 mEq/L (ref 136–145)
Total Bilirubin: 0.24 mg/dL (ref 0.20–1.20)
Total Protein: 6.6 g/dL (ref 6.4–8.3)

## 2012-09-07 MED ORDER — ACETAMINOPHEN 325 MG PO TABS
650.0000 mg | ORAL_TABLET | Freq: Once | ORAL | Status: AC
  Administered 2012-09-07: 650 mg via ORAL

## 2012-09-07 MED ORDER — DEXAMETHASONE SODIUM PHOSPHATE 4 MG/ML IJ SOLN
20.0000 mg | Freq: Once | INTRAMUSCULAR | Status: AC
  Administered 2012-09-07: 20 mg via INTRAVENOUS

## 2012-09-07 MED ORDER — DIPHENHYDRAMINE HCL 25 MG PO CAPS
50.0000 mg | ORAL_CAPSULE | Freq: Once | ORAL | Status: AC
  Administered 2012-09-07: 25 mg via ORAL

## 2012-09-07 MED ORDER — SODIUM CHLORIDE 0.9 % IV SOLN
520.0000 mg | Freq: Once | INTRAVENOUS | Status: AC
  Administered 2012-09-07: 520 mg via INTRAVENOUS
  Filled 2012-09-07: qty 52

## 2012-09-07 MED ORDER — SODIUM CHLORIDE 0.9 % IV SOLN
Freq: Once | INTRAVENOUS | Status: AC
  Administered 2012-09-07: 14:00:00 via INTRAVENOUS

## 2012-09-07 MED ORDER — SODIUM CHLORIDE 0.9 % IJ SOLN
10.0000 mL | INTRAMUSCULAR | Status: DC | PRN
  Administered 2012-09-07: 10 mL
  Filled 2012-09-07: qty 10

## 2012-09-07 MED ORDER — HEPARIN SOD (PORK) LOCK FLUSH 100 UNIT/ML IV SOLN
500.0000 [IU] | Freq: Once | INTRAVENOUS | Status: AC | PRN
  Administered 2012-09-07: 500 [IU]
  Filled 2012-09-07: qty 5

## 2012-09-07 MED ORDER — ONDANSETRON 16 MG/50ML IVPB (CHCC)
16.0000 mg | Freq: Once | INTRAVENOUS | Status: AC
  Administered 2012-09-07: 16 mg via INTRAVENOUS

## 2012-09-07 MED ORDER — TRASTUZUMAB CHEMO INJECTION 440 MG
6.0000 mg/kg | Freq: Once | INTRAVENOUS | Status: AC
  Administered 2012-09-07: 357 mg via INTRAVENOUS
  Filled 2012-09-07: qty 17

## 2012-09-07 MED ORDER — DOCETAXEL CHEMO INJECTION 160 MG/16ML
75.0000 mg/m2 | Freq: Once | INTRAVENOUS | Status: AC
  Administered 2012-09-07: 120 mg via INTRAVENOUS
  Filled 2012-09-07: qty 12

## 2012-09-07 NOTE — Telephone Encounter (Signed)
No new orders. Per pt she was just to get an updated schedule. Pt given appt schedule for March thru May.

## 2012-09-07 NOTE — Patient Instructions (Addendum)
[facility] Discharge Instructions for Patients Receiving Chemotherapy  Today you received the following chemotherapy agents taxotere/ carboplatin/ herceptijn To help prevent nausea and vomiting after your treatment, we encourage you to take your nausea medication  and take it as often as prescribed   If you develop nausea and vomiting that is not controlled by your nausea medication, call the clinic. If it is after clinic hours your family physician or the after hours number for the clinic or go to the Emergency Department.   BELOW ARE SYMPTOMS THAT SHOULD BE REPORTED IMMEDIATELY:  *FEVER GREATER THAN 100.5 F  *CHILLS WITH OR WITHOUT FEVER  NAUSEA AND VOMITING THAT IS NOT CONTROLLED WITH YOUR NAUSEA MEDICATION  *UNUSUAL SHORTNESS OF BREATH  *UNUSUAL BRUISING OR BLEEDING  TENDERNESS IN MOUTH AND THROAT WITH OR WITHOUT PRESENCE OF ULCERS  *URINARY PROBLEMS  *BOWEL PROBLEMS  UNUSUAL RASH Items with * indicate a potential emergency and should be followed up as soon as possible.  One of the nurses will contact you 24 hours after your treatment. Please let the nurse know about any problems that you may have experienced. Feel free to call the clinic you have any questions or concerns. The clinic phone number is [phone].   I have been informed and understand all the instructions given to me. I know to contact the clinic, my physician, or go to the Emergency Department if any problems should occur. I do not have any questions at this time, but understand that I may call the clinic during office hours   should I have any questions or need assistance in obtaining follow up care.    __________________________________________  _____________  __________ Signature of Patient or Authorized Representative            Date                   Time    __________________________________________ Nurse's Signature

## 2012-09-07 NOTE — Progress Notes (Signed)
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 At that time a digital bilateal diagnostic mammogram showed a slight decrease in size of the benign fibroadenoma in the left breast compared to 2010. The same mammogram, however, also refilled pleomorphic calcifications extending over 9 cm in the inner third of the right breast. There were no suspicious calcifications in the left breast. On exam, Dr. Lyman Bishop was able to palpate a small freely mobile mass in the lower inner left breast, but was unable to palpate any abnormalities in the right breast other than some focal thickening.  An subsequent ultrasound of the right breast on 06/16/2012 showed multiple hypoechoic masses throughout the upper quadrants of the right breast. The palpable mass in the 1:00 location corresponded with an irregular hypoechoic mass measuring 1.7 x 1.4 cm. There was also a discrete hypoechoic lobulated mass at the 11:00 position measuring 2.1 x 1.1 x 2.1 cm. The third lesion identified at the 11:00 location, 3 cm from the nipple, measured 1.0 x 0.8 x 0.9 cm.   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  Bilateral breast MRI 06/23/2012 showed an enhancing mass in the upper central right breast measuring 1.8 cm, together with clumped nodular enhancement extending for a total area of 11.4 cm. In the upper outer quadrant of this breast there were 3 discrete  irregular masses measuring up to 3 cm. This correlated well with the ultrasound findings previously. In the left breast there was a large area of clumped nodular enhancement measuring up to 8.9 cm. In the lower central portion there was a 1.7 cm circumscribed nodule consistent with a known fibroadenoma. There weas no axillary or internal mammary adenopathy noted on either side.   The 8.9 cm area of abnormality in the left breast was biopsied on 06/30/2012 showing fibrocystic changes with adenosis and calcifications, pseudoangiomatous stromal hyperplasia, but no evidence of malignancy.  The patient underwent bilateral mastectomies under the care of Dr. Luisa Hart on 07/14/2012. The left mastectomy revealed benign breast tissue and was negative for malignancy. 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 Lymphovascular invasion was identified; #2) a ductal carcinoma in situ, grade 3, with comedo necrosis.  Surgical margins were negative. 4 lymph nodes were examined, one positive for metastatic mammary carcinoma, a second positive for isolated tumor cells. pT2, pN1a  Subsequent history is detailed below.  INTERVAL HISTORY: Sherry Barry returns today  for followup of her right breast cancer. She is due for day 1 cycle 2 of 6 planned adjuvant cycles of docetaxel/carboplatin/trastuzumab. She received Neulasta on day 2 for granulocyte support.  Overall, Sherry Barry feels like she has recovered well from the chemotherapy. Her biggest complaints are right sided sinus congestion with headaches which occur primarily in the mornings. Her blood pressure has been running a little high, so she is hesitant to  take decongestants. She's had no epistasis. The mucus is clear. She's had no sore throats or problems swallowing, and also denies any fevers, chills, night sweats, cough, phlegm production, or shortness of breath.  REVIEW OF  SYSTEMS: [name] denies any rashes or skin changes, other than some mild hyperpigmentation of the nailbeds.. She's had no abnormal bleeding. She's eating and drinking well. She's had no problems with nausea or emesis. She's alternated between mild constipation and loose stools, but is managing to have bowel movements on a daily basis. She denies chest pain, or palpitations. . She's had no peripheral swelling, and denies any signs of numbness 13 wing in either the upper or lower extremities.   A detailed review of systems is otherwise stable and noncontributory.   PAST MEDICAL HISTORY: Past Medical History  Diagnosis Date  . Allergy   . Migraine   . Migraine   . Breast cancer   . History of migraine     last one about a week ago    PAST SURGICAL HISTORY: Past Surgical History  Procedure Laterality Date  . Abdominal hysterectomy      no salpingo-oophorectomy  . Simple mastectomy with axillary sentinel node biopsy  07/14/2012    Procedure: SIMPLE MASTECTOMY WITH AXILLARY SENTINEL NODE BIOPSY;  Surgeon: Clovis Pu. Cornett, MD;  Location: [facility] OR;  Service: General;  Laterality: Right;  Bilateral simple mastectomy with port and right sebtibel lymph node mapping  . Simple mastectomy with axillary sentinel node biopsy  07/14/2012    Procedure: SIMPLE MASTECTOMY;  Surgeon: Clovis Pu. Cornett, MD;  Location: [facility] OR;  Service: General;  Laterality: Left;  . Portacath placement  07/14/2012    Procedure: INSERTION PORT-A-CATH;  Surgeon: Clovis Pu. Cornett, MD;  Location: [facility] OR;  Service: General;  Laterality: Left;    FAMILY HISTORY Family History  Problem Relation Age of Onset  . Hypertension Mother   . Alcohol abuse Mother   . Heart disease Maternal Grandmother   . Stroke Maternal Grandfather   . Colon cancer Maternal Aunt   . Brain cancer Paternal Uncle    the patient's father died in an automobile accident in his early 51s. The patient's mother is living, currently age 78. The patient has one  brother and one sister. There is no family history of breast or ovarian cancer, although there are maternal uncles with brain, colon and lymph node cancers.  GYNECOLOGIC HISTORY: Menarche age 46, first live birth age 62, she is GX P2. She stopped having periods with her surgery March of 2009. She does have "premenstrual symptoms" on a regular basis.  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 Her husband Brand Males works for a company in the [place] area doing testing. Their children Jaden (9) and Bryson (4) at home.   ADVANCED DIRECTIVES: not in place  HEALTH MAINTENANCE: History  Substance Use Topics  . Smoking status: Never Smoker   . Smokeless tobacco: Not on file  . Alcohol Use: Yes     Comment: occasional     Colonoscopy: -  PAP: 09/2010  Bone density: -  Lipid panel:  Allergies  Allergen Reactions  . Codeine Itching  . Latex Swelling    Current Outpatient Prescriptions  Medication Sig Dispense Refill  . dexamethasone (DECADRON) 4 MG tablet Take 2 tablets (8 mg total) by mouth 2 (two) times daily with a meal. Take two times a day the day before Taxotere. Then take two times a day  starting the day after chemo for 3 days.  30 tablet  1  . lidocaine-prilocaine (EMLA) cream Apply topically as needed. Apply to port 1-2 hours before chemo and cover with plastic wrap  30 g  0  . LORazepam (ATIVAN) 0.5 MG tablet Take 1 tablet (0.5 mg total) by mouth every 6 (six) hours as needed (Nausea or vomiting).  30 tablet  0  . mometasone (NASONEX) 50 MCG/ACT nasal spray Place 2 sprays into the nose daily.      . ondansetron (ZOFRAN) 8 MG tablet Take 1 tablet (8 mg total) by mouth 2 (two) times daily. Take two times a day starting the day after chemo for 3 days. Then take two times a day as needed for nausea or vomiting.  30 tablet  1  . prochlorperazine (COMPAZINE) 10 MG tablet Take 1 tablet (10 mg total) by mouth every 6 (six) hours  as needed (Nausea or vomiting).  30 tablet  1  . prochlorperazine (COMPAZINE) 25 MG suppository Place 1 suppository (25 mg total) rectally every 12 (twelve) hours as needed for nausea.  12 suppository  3  . valACYclovir (VALTREX) 500 MG tablet Take 1 tablet (500 mg total) by mouth 2 (two) times daily.  30 tablet  3   No current facility-administered medications for this visit.   Facility-Administered Medications Ordered in Other Visits  Medication Dose Route Frequency Provider Last Rate Last Dose  . acetaminophen (TYLENOL) tablet 650 mg  650 mg Oral Once Clancy Leiner G Eddie Koc, PA-C      . CARBOplatin (PARAPLATIN) 520 mg in sodium chloride 0.9 % 250 mL chemo infusion  520 mg Intravenous Once Lowella Dell, MD      . diphenhydrAMINE (BENADRYL) capsule 50 mg  50 mg Oral Once Oaklee Sunga G Alvin Rubano, PA-C      . DOCEtaxel (TAXOTERE) 120 mg in sodium chloride 0.9 % 250 mL chemo infusion  75 mg/m2 (Treatment Plan Actual) Intravenous Once Keyandra Swenson G Selene Peltzer, PA-C      . heparin lock flush 100 unit/mL  500 Units Intracatheter Once PRN Mychaela Lennartz Allegra Grana, PA-C      . ondansetron (ZOFRAN) IVPB 16 mg  16 mg Intravenous Once Lavelle Berland G Octavio Matheney, PA-C   16 mg at 09/07/12 1422  . sodium chloride 0.9 % injection 10 mL  10 mL Intracatheter PRN Cierra Rothgeb G Hazelgrace Bonham, PA-C      . trastuzumab (HERCEPTIN) 357 mg in sodium chloride 0.9 % 250 mL chemo infusion  6 mg/kg (Treatment Plan Actual) Intravenous Once Ester Mabe Allegra Grana, PA-C        OBJECTIVE: Young-appearing African American woman in no acute distress Filed Vitals:   09/07/12 1313  BP: 144/101  Pulse: 106  Temp: 98.3 F (36.8 C)  Resp: 20     Body mass index is 25.23 kg/(m^2).    ECOG FS: 1 Filed Weights   09/07/12 1313  Weight: 138 lb (62.596 kg)    Sclerae unicteric Oropharynx clear, with no ulcerations or evidence of candidiasis. No cervical or supraclavicular adenopathy Lungs clear to auscultation, no rales or rhonchi Heart regular rate and rhythm Abdomen soft, nontender, positive bowel  sounds MSK no focal spinal tenderness, no peripheral edema Neuro: nonfocal. Well oriented with positive affect. Breasts: Deferred. Both axillae are benign.   LAB RESULTS: Lab Results  Component Value Date   WBC 9.7 09/07/2012   NEUTROABS 7.1* 09/07/2012   HGB 12.3 09/07/2012   HCT 35.6 09/07/2012   MCV 85.8 09/07/2012   PLT 282  09/07/2012      Chemistry      Component Value Date/Time   NA 138 08/23/2012 1342   NA 135 07/15/2012 0500   K 3.6 08/23/2012 1342   K 3.9 07/15/2012 0500   CL 100 08/23/2012 1342   CL 103 07/15/2012 0500   CO2 29 08/23/2012 1342   CO2 25 07/15/2012 0500   BUN 15.2 08/23/2012 1342   BUN 7 07/15/2012 0500   CREATININE 0.9 08/23/2012 1342   CREATININE 0.62 07/15/2012 0500   CREATININE 0.80 05/02/2012 1412      Component Value Date/Time   CALCIUM 9.5 08/23/2012 1342   CALCIUM 8.2* 07/15/2012 0500   ALKPHOS 76 08/23/2012 1342   ALKPHOS 46 07/14/2012 2300   AST 17 08/23/2012 1342   AST 14 07/14/2012 2300   ALT 22 08/23/2012 1342   ALT 7 07/14/2012 2300   BILITOT 0.37 08/23/2012 1342   BILITOT 0.3 07/14/2012 2300       Lab Results  Component Value Date   LABCA2 41* 06/28/2012     STUDIES:  Echocardiogram 08/01/2012 showed an ejection fraction in the 60-65% range.        ASSESSMENT: 42 y.o. [place] woman status post right mastectomy and sentinel lymph node sampling 07/14/2012 (with simple left mastectomy)  for a pT2, pN1, stage IIB invasive ductal carcinoma, grade 3, estrogen receptor 100% and progesterone receptor 100% positive, with an MIB-1 of 48%, and HER-2 amplified by CISH with a ratio of 3.02.  (1) to start adjuvant chemotherapy 08/17/2012, consisting of carboplatin, docetaxel and trastuzumab given every 3 weeks for 6 cycles  (2) trastuzumab to be continued in to March of 2015. Most recent echocardiogram 08/01/2012  PLAN:  Overall, Sherry Barry is tolerating treatment well, and is ready for cycle 2 today as scheduled. She will receive her Neulasta injection  tomorrow, and I'll see her again next week for assessment of chemotoxicity on April 2.   We'll continue to follow her hypertension, and will consider a low dose of the Cipro was discontinued. She will monitor her blood pressure home as well, and will bring [place] a list of readings when she returns next week.  With regards to the sinus congestion, I have recommended that she try Mucinex along with her Claritin. For now, we will avoid decongestants due to the elevated blood pressure. She does have Nasonex nasal spray on hand at home and I recommended that she try 1 spray in each nostril twice daily, and continue to simple saline spray as well.  She'll let us know if the symptoms worsen, or if she begins to have fevers of 100 or above. That point, we certainly would consider starting her on antibiotic.  Aquila voices understanding and agreement with this plan, we'll call with any changes or problems.   Keshaun Dubey    09/07/2012

## 2012-09-08 ENCOUNTER — Encounter: Payer: Self-pay | Admitting: Oncology

## 2012-09-08 ENCOUNTER — Ambulatory Visit (HOSPITAL_BASED_OUTPATIENT_CLINIC_OR_DEPARTMENT_OTHER): Payer: BC Managed Care – PPO

## 2012-09-08 VITALS — BP 144/102 | HR 87 | Temp 98.2°F

## 2012-09-08 DIAGNOSIS — C50219 Malignant neoplasm of upper-inner quadrant of unspecified female breast: Secondary | ICD-10-CM

## 2012-09-08 DIAGNOSIS — Z5189 Encounter for other specified aftercare: Secondary | ICD-10-CM

## 2012-09-08 MED ORDER — PEGFILGRASTIM INJECTION 6 MG/0.6ML
6.0000 mg | Freq: Once | SUBCUTANEOUS | Status: AC
  Administered 2012-09-08: 6 mg via SUBCUTANEOUS
  Filled 2012-09-08: qty 0.6

## 2012-09-12 ENCOUNTER — Encounter: Payer: Self-pay | Admitting: *Deleted

## 2012-09-13 ENCOUNTER — Telehealth: Payer: Self-pay | Admitting: *Deleted

## 2012-09-13 ENCOUNTER — Other Ambulatory Visit: Payer: BC Managed Care – PPO | Admitting: Lab

## 2012-09-13 ENCOUNTER — Ambulatory Visit: Payer: BC Managed Care – PPO | Admitting: Physician Assistant

## 2012-09-13 NOTE — Telephone Encounter (Signed)
Called pt to let her know that Amy is sick and cannot see her today.  Confirmed rescheduled 09/18/12 appt w/ pt.

## 2012-09-18 ENCOUNTER — Telehealth: Payer: Self-pay | Admitting: *Deleted

## 2012-09-18 ENCOUNTER — Other Ambulatory Visit (HOSPITAL_BASED_OUTPATIENT_CLINIC_OR_DEPARTMENT_OTHER): Payer: BC Managed Care – PPO | Admitting: Lab

## 2012-09-18 ENCOUNTER — Ambulatory Visit (HOSPITAL_BASED_OUTPATIENT_CLINIC_OR_DEPARTMENT_OTHER): Payer: BC Managed Care – PPO | Admitting: Physician Assistant

## 2012-09-18 ENCOUNTER — Encounter: Payer: Self-pay | Admitting: Physician Assistant

## 2012-09-18 VITALS — BP 125/87 | HR 98 | Temp 98.2°F | Resp 20 | Ht 62.0 in | Wt 139.8 lb

## 2012-09-18 DIAGNOSIS — C50219 Malignant neoplasm of upper-inner quadrant of unspecified female breast: Secondary | ICD-10-CM

## 2012-09-18 DIAGNOSIS — C50911 Malignant neoplasm of unspecified site of right female breast: Secondary | ICD-10-CM

## 2012-09-18 DIAGNOSIS — Z170 Estrogen receptor positive status [ER+]: Secondary | ICD-10-CM

## 2012-09-18 LAB — CBC WITH DIFFERENTIAL/PLATELET
Basophils Absolute: 0.1 10*3/uL (ref 0.0–0.1)
Eosinophils Absolute: 0 10*3/uL (ref 0.0–0.5)
HCT: 40.5 % (ref 34.8–46.6)
HGB: 13.8 g/dL (ref 11.6–15.9)
MCV: 86.9 fL (ref 79.5–101.0)
MONO%: 6 % (ref 0.0–14.0)
NEUT#: 16.2 10*3/uL — ABNORMAL HIGH (ref 1.5–6.5)
NEUT%: 77.7 % — ABNORMAL HIGH (ref 38.4–76.8)
RDW: 13.6 % (ref 11.2–14.5)
lymph#: 3.3 10*3/uL (ref 0.9–3.3)

## 2012-09-18 NOTE — Progress Notes (Signed)
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 At that time a digital bilateal diagnostic mammogram showed a slight decrease in size of the benign fibroadenoma in the left breast compared to 2010. The same mammogram, however, also refilled pleomorphic calcifications extending over 9 cm in the inner third of the right breast. There were no suspicious calcifications in the left breast. On exam, Dr. Lyman Barry was able to palpate a small freely mobile mass in the lower inner left breast, but was unable to palpate any abnormalities in the right breast other than some focal thickening.  An subsequent ultrasound of the right breast on 06/16/2012 showed multiple hypoechoic masses throughout the upper quadrants of the right breast. The palpable mass in the 1:00 location corresponded with an irregular hypoechoic mass measuring 1.7 x 1.4 cm. There was also a discrete hypoechoic lobulated mass at the 11:00 position measuring 2.1 x 1.1 x 2.1 cm. The third lesion identified at the 11:00 location, 3 cm from the nipple, measured 1.0 x 0.8 x 0.9 cm.   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  Bilateral breast MRI 06/23/2012 showed an enhancing mass in the upper central right breast measuring 1.8 cm, together with clumped nodular enhancement extending for a total area of 11.4 cm. In the upper outer quadrant of this breast there were 3 discrete  irregular masses measuring up to 3 cm. This correlated well with the ultrasound findings previously. In the left breast there was a large area of clumped nodular enhancement measuring up to 8.9 cm. In the lower central portion there was a 1.7 cm circumscribed nodule consistent with a known fibroadenoma. There weas no axillary or internal mammary adenopathy noted on either side.   The 8.9 cm area of abnormality in the left breast was biopsied on 06/30/2012 showing fibrocystic changes with adenosis and calcifications, pseudoangiomatous stromal hyperplasia, but no evidence of malignancy.  The patient underwent bilateral mastectomies under the care of Dr. Luisa Barry on 07/14/2012. The left mastectomy revealed benign breast tissue and was negative for malignancy. 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 Lymphovascular invasion was identified; #2) a ductal carcinoma in situ, grade 3, with comedo necrosis.  Surgical margins were negative. 4 lymph nodes were examined, one positive for metastatic mammary carcinoma, a second positive for isolated tumor cells. pT2, pN1a  Subsequent history is detailed below.  INTERVAL HISTORY: Sherry Barry returns today  for followup of her right breast cancer. She is currently day 12 cycle 2 of 6 planned adjuvant cycles of docetaxel/carboplatin/trastuzumab. She received Neulasta on day 2 for granulocyte support.  Sherry Barry is feeling well with "no complaints". She's had no significant side effects following chemotherapy. She continues to deny any signs of peripheral neuropathy in either the upper or lower extremities.   REVIEW OF SYSTEMS: Sherry Barry has had no fevers, chills,  or night sweats. She denies any rashes or skin changes, other than some mild hyperpigmentation of the nailbeds. She's had no abnormal bruising or bleeding. She's eating and drinking well. She's had no problems with  nausea or emesis and denies any change in bowel or bladder habits. She denies any cough, shortness of breath, or chest pain, and has had no abnormal headaches or dizziness. She's had no significant myalgias, arthralgias, or bony pain, even following the Neulasta injection. She denies any peripheral swelling.  A detailed review of systems is otherwise stable and noncontributory.   PAST MEDICAL HISTORY: Past Medical History  Diagnosis Date  . Allergy   . Migraine   . Migraine   . Breast cancer   . History of migraine     last one about a week ago    PAST SURGICAL HISTORY: Past Surgical History  Procedure Laterality Date  . Abdominal hysterectomy      no salpingo-oophorectomy  . Simple mastectomy with axillary sentinel node biopsy  07/14/2012    Procedure: SIMPLE MASTECTOMY WITH AXILLARY SENTINEL NODE BIOPSY;  Surgeon: Sherry Pu. Cornett, MD;  Location: [facility] OR;  Service: General;  Laterality: Right;  Bilateral simple mastectomy with port and right sebtibel lymph node mapping  . Simple mastectomy with axillary sentinel node biopsy  07/14/2012    Procedure: SIMPLE MASTECTOMY;  Surgeon: Sherry Pu. Cornett, MD;  Location: [facility] OR;  Service: General;  Laterality: Left;  . Portacath placement  07/14/2012    Procedure: INSERTION PORT-A-CATH;  Surgeon: Sherry Pu. Cornett, MD;  Location: [facility] OR;  Service: General;  Laterality: Left;    FAMILY HISTORY Family History  Problem Relation Age of Onset  . Hypertension Mother   . Alcohol abuse Mother   . Heart disease Maternal Grandmother   . Stroke Maternal Grandfather   . Colon cancer Maternal Aunt   . Brain cancer Paternal Uncle    the patient's father died in an automobile accident in his early 58s. The patient's mother is living, currently age 40. The patient has one brother and one sister. There is no family history of breast or ovarian cancer, although there are maternal uncles with brain, colon and lymph node cancers.  GYNECOLOGIC  HISTORY: Menarche age 75, first live birth age 86, she is GX P2. She stopped having periods with her surgery March of 2009. She does have "premenstrual symptoms" on a regular basis.  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 Her husband Brand Males works for a company in the [place] area doing testing. Their children Jaden (9) and Bryson (4) at home.   ADVANCED DIRECTIVES: not in place  HEALTH MAINTENANCE: History  Substance Use Topics  . Smoking status: Never Smoker   . Smokeless tobacco: Never Used  . Alcohol Use: Yes     Comment: occasional     Colonoscopy: -  PAP: 09/2010  Bone density: -  Lipid panel:  Allergies  Allergen Reactions  . Codeine Itching  . Latex Swelling    Current Outpatient Prescriptions  Medication Sig Dispense Refill  . dexamethasone (DECADRON) 4 MG tablet Take 2 tablets (8 mg total) by mouth 2 (two) times daily with a meal. Take two times a day the day before Taxotere. Then take two times a day starting the day after chemo for 3 days.  30 tablet  1  . lidocaine-prilocaine (EMLA) cream Apply topically as needed. Apply to port 1-2 hours before chemo and cover with plastic wrap  30 g  0  . LORazepam (ATIVAN) 0.5 MG tablet Take 1 tablet (0.5 mg total) by mouth every 6 (six) hours as needed (Nausea or vomiting).  30 tablet  0  . mometasone (NASONEX) 50 MCG/ACT nasal spray Place 2 sprays into the nose daily.      . ondansetron (ZOFRAN) 8 MG tablet Take 1 tablet (8 mg total) by mouth 2 (two) times daily. Take two times a day starting the day after chemo for 3 days. Then take two times a day as needed for nausea or vomiting.  30 tablet  1  . prochlorperazine (COMPAZINE) 10 MG tablet Take 1 tablet (10 mg total) by mouth every 6 (six) hours as needed (Nausea or vomiting).  30 tablet  1  . prochlorperazine (COMPAZINE) 25 MG suppository Place 1 suppository (25 mg total) rectally every 12 (twelve) hours as  needed for nausea.  12 suppository  3  . valACYclovir (VALTREX) 500 MG tablet Take 1 tablet (500 mg total) by mouth 2 (two) times daily.  30 tablet  3   No current facility-administered medications for this visit.    OBJECTIVE: Young-appearing African American woman in no acute distress Filed Vitals:   09/18/12 1435  BP: 125/87  Pulse: 98  Temp: 98.2 F (36.8 C)  Resp: 20     Body mass index is 25.56 kg/(m^2).    ECOG FS: 1 Filed Weights   09/18/12 1435  Weight: 139 lb 12.8 oz (63.413 kg)    Sclerae unicteric Oropharynx clear, with no ulcerations or evidence of candidiasis. No cervical or supraclavicular adenopathy Lungs clear to auscultation, no rales or rhonchi Heart regular rate and rhythm Abdomen soft, nontender, positive bowel sounds MSK no focal spinal tenderness, no peripheral edema Neuro: nonfocal. Well oriented with positive affect. Breasts: Deferred. Both axillae are benign.   LAB RESULTS: Lab Results  Component Value Date   WBC 20.8* 09/18/2012   NEUTROABS 16.2* 09/18/2012   HGB 13.8 09/18/2012   HCT 40.5 09/18/2012   MCV 86.9 09/18/2012   PLT 168 09/18/2012      Chemistry      Component Value Date/Time   NA 142 09/07/2012 1234   NA 135 07/15/2012 0500   K 3.4* 09/07/2012 1234   K 3.9 07/15/2012 0500   CL 107 09/07/2012 1234   CL 103 07/15/2012 0500   CO2 26 09/07/2012 1234   CO2 25 07/15/2012 0500   BUN 16.8 09/07/2012 1234   BUN 7 07/15/2012 0500   CREATININE 0.8 09/07/2012 1234   CREATININE 0.62 07/15/2012 0500   CREATININE 0.80 05/02/2012 1412      Component Value Date/Time   CALCIUM 9.6 09/07/2012 1234   CALCIUM 8.2* 07/15/2012 0500   ALKPHOS 64 09/07/2012 1234   ALKPHOS 46 07/14/2012 2300   AST 18 09/07/2012 1234   AST 14 07/14/2012 2300   ALT 16 09/07/2012 1234   ALT 7 07/14/2012 2300   BILITOT 0.24 09/07/2012 1234   BILITOT 0.3 07/14/2012 2300       Lab Results  Component Value Date   LABCA2 41* 06/28/2012     STUDIES:  Echocardiogram 08/01/2012 showed an  ejection fraction in the 60-65% range.        ASSESSMENT: 42 y.o. [place] woman status post right mastectomy and sentinel lymph node sampling 07/14/2012 (with simple left mastectomy)  for a pT2, pN1, stage IIB invasive ductal carcinoma, grade 3, estrogen receptor 100% and progesterone receptor 100% positive, with an MIB-1 of 48%, and HER-2  amplified by CISH with a ratio of 3.02.  (1) to start adjuvant chemotherapy 08/17/2012, consisting of carboplatin, docetaxel and trastuzumab given every 3 weeks for 6 cycles  (2) trastuzumab to be continued in to March of 2015. Most recent echocardiogram 08/01/2012  PLAN:  Jalaya continues to tolerate treatment very well. She would like to go ahead and meet with Dr. Dayton Scrape to discuss whether or not he feels she needs to proceed with postmastectomy radiation. We will get that scheduled for her accordingly.  Otherwise, I will see her again next week on April 17, in anticipation of day 1 cycle 3 of adjuvant docetaxel/carboplatin/trastuzumab. She'll call prior that time with any changes or problems.  Her next echocardiogram will be due in mid May, and we will get that scheduled for her when she comes in next week.   Gretchen Weinfeld    09/18/2012

## 2012-09-18 NOTE — Telephone Encounter (Signed)
appts made and printed 

## 2012-09-20 ENCOUNTER — Telehealth: Payer: Self-pay | Admitting: *Deleted

## 2012-09-20 NOTE — Telephone Encounter (Signed)
Per staff message I have adjusted appts. JM W 

## 2012-09-26 ENCOUNTER — Telehealth: Payer: Self-pay | Admitting: Oncology

## 2012-09-26 ENCOUNTER — Encounter: Payer: Self-pay | Admitting: Dietician

## 2012-09-26 NOTE — Progress Notes (Signed)
Breast Cancer Nutrition Class Attendance Note  Date: 09/26/2012  Pt attended [facility]'s Breast Cancer Nutrition Class, "Food For Your Fight". Pt was educated on basic cancer nutrition principles, including plant based diet and principles from AICR  ([employer]) about latest nutrition findings and recommendations. Questions answered. Handouts and recipes provided.   Ryver Poblete A. Kayan, RD, LDN Pager: [phone]  

## 2012-09-26 NOTE — Telephone Encounter (Signed)
Moved 4/17 genetics appt to 4/30 w/covering provider due to [place] on [place]. S/w pt mother re change that genetics appt has been moved to 4/30 @ 11am and new time for appts on 4/17 is 11am. Also lmonvm for pt on cell.

## 2012-09-27 ENCOUNTER — Other Ambulatory Visit: Payer: Self-pay | Admitting: Oncology

## 2012-09-28 ENCOUNTER — Encounter: Payer: Self-pay | Admitting: Oncology

## 2012-09-28 ENCOUNTER — Encounter: Payer: BC Managed Care – PPO | Admitting: Genetic Counselor

## 2012-09-28 ENCOUNTER — Encounter: Payer: Self-pay | Admitting: Physician Assistant

## 2012-09-28 ENCOUNTER — Ambulatory Visit (HOSPITAL_BASED_OUTPATIENT_CLINIC_OR_DEPARTMENT_OTHER): Payer: BC Managed Care – PPO

## 2012-09-28 ENCOUNTER — Ambulatory Visit (HOSPITAL_BASED_OUTPATIENT_CLINIC_OR_DEPARTMENT_OTHER): Payer: BC Managed Care – PPO | Admitting: Physician Assistant

## 2012-09-28 ENCOUNTER — Other Ambulatory Visit (HOSPITAL_BASED_OUTPATIENT_CLINIC_OR_DEPARTMENT_OTHER): Payer: BC Managed Care – PPO | Admitting: Lab

## 2012-09-28 ENCOUNTER — Telehealth: Payer: Self-pay | Admitting: Oncology

## 2012-09-28 VITALS — BP 158/94 | HR 102 | Temp 98.3°F | Resp 20 | Ht 62.0 in | Wt 141.8 lb

## 2012-09-28 DIAGNOSIS — C50219 Malignant neoplasm of upper-inner quadrant of unspecified female breast: Secondary | ICD-10-CM

## 2012-09-28 DIAGNOSIS — C50211 Malignant neoplasm of upper-inner quadrant of right female breast: Secondary | ICD-10-CM

## 2012-09-28 DIAGNOSIS — Z5111 Encounter for antineoplastic chemotherapy: Secondary | ICD-10-CM

## 2012-09-28 DIAGNOSIS — Z5112 Encounter for antineoplastic immunotherapy: Secondary | ICD-10-CM

## 2012-09-28 DIAGNOSIS — C50911 Malignant neoplasm of unspecified site of right female breast: Secondary | ICD-10-CM

## 2012-09-28 DIAGNOSIS — I10 Essential (primary) hypertension: Secondary | ICD-10-CM

## 2012-09-28 DIAGNOSIS — Z170 Estrogen receptor positive status [ER+]: Secondary | ICD-10-CM

## 2012-09-28 DIAGNOSIS — H5711 Ocular pain, right eye: Secondary | ICD-10-CM

## 2012-09-28 LAB — COMPREHENSIVE METABOLIC PANEL (CC13)
BUN: 18.2 mg/dL (ref 7.0–26.0)
CO2: 26 mEq/L (ref 22–29)
Calcium: 9.7 mg/dL (ref 8.4–10.4)
Chloride: 105 mEq/L (ref 98–107)
Creatinine: 0.8 mg/dL (ref 0.6–1.1)
Glucose: 100 mg/dl — ABNORMAL HIGH (ref 70–99)

## 2012-09-28 LAB — CBC WITH DIFFERENTIAL/PLATELET
Eosinophils Absolute: 0 10*3/uL (ref 0.0–0.5)
HCT: 35.3 % (ref 34.8–46.6)
LYMPH%: 9.8 % — ABNORMAL LOW (ref 14.0–49.7)
MONO#: 1.1 10*3/uL — ABNORMAL HIGH (ref 0.1–0.9)
NEUT#: 12.8 10*3/uL — ABNORMAL HIGH (ref 1.5–6.5)
NEUT%: 83.1 % — ABNORMAL HIGH (ref 38.4–76.8)
Platelets: 225 10*3/uL (ref 145–400)
WBC: 15.4 10*3/uL — ABNORMAL HIGH (ref 3.9–10.3)

## 2012-09-28 MED ORDER — HEPARIN SOD (PORK) LOCK FLUSH 100 UNIT/ML IV SOLN
500.0000 [IU] | Freq: Once | INTRAVENOUS | Status: AC | PRN
  Administered 2012-09-28: 500 [IU]
  Filled 2012-09-28: qty 5

## 2012-09-28 MED ORDER — SODIUM CHLORIDE 0.9 % IV SOLN
750.0000 mg | Freq: Once | INTRAVENOUS | Status: AC
  Administered 2012-09-28: 750 mg via INTRAVENOUS
  Filled 2012-09-28: qty 75

## 2012-09-28 MED ORDER — DEXAMETHASONE SODIUM PHOSPHATE 4 MG/ML IJ SOLN
20.0000 mg | Freq: Once | INTRAMUSCULAR | Status: AC
  Administered 2012-09-28: 20 mg via INTRAVENOUS

## 2012-09-28 MED ORDER — TRASTUZUMAB CHEMO INJECTION 440 MG
6.0000 mg/kg | Freq: Once | INTRAVENOUS | Status: AC
  Administered 2012-09-28: 357 mg via INTRAVENOUS
  Filled 2012-09-28: qty 17

## 2012-09-28 MED ORDER — DIPHENHYDRAMINE HCL 25 MG PO CAPS
50.0000 mg | ORAL_CAPSULE | Freq: Once | ORAL | Status: AC
  Administered 2012-09-28: 50 mg via ORAL

## 2012-09-28 MED ORDER — ACETAMINOPHEN 325 MG PO TABS
650.0000 mg | ORAL_TABLET | Freq: Once | ORAL | Status: AC
  Administered 2012-09-28: 650 mg via ORAL

## 2012-09-28 MED ORDER — LISINOPRIL 5 MG PO TABS: 5.0000 mg | ORAL_TABLET | Freq: Every day | ORAL | Status: DC

## 2012-09-28 MED ORDER — SODIUM CHLORIDE 0.9 % IJ SOLN
10.0000 mL | INTRAMUSCULAR | Status: DC | PRN
  Administered 2012-09-28: 10 mL
  Filled 2012-09-28: qty 10

## 2012-09-28 MED ORDER — ONDANSETRON 16 MG/50ML IVPB (CHCC)
16.0000 mg | Freq: Once | INTRAVENOUS | Status: AC
  Administered 2012-09-28: 16 mg via INTRAVENOUS

## 2012-09-28 MED ORDER — DOCETAXEL CHEMO INJECTION 160 MG/16ML
75.0000 mg/m2 | Freq: Once | INTRAVENOUS | Status: AC
  Administered 2012-09-28: 120 mg via INTRAVENOUS
  Filled 2012-09-28: qty 12

## 2012-09-28 MED ORDER — SODIUM CHLORIDE 0.9 % IV SOLN
Freq: Once | INTRAVENOUS | Status: AC
  Administered 2012-09-28: 12:00:00 via INTRAVENOUS

## 2012-09-28 NOTE — Progress Notes (Signed)
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 At that time a digital bilateal diagnostic mammogram showed a slight decrease in size of the benign fibroadenoma in the left breast compared to 2010. The same mammogram, however, also refilled pleomorphic calcifications extending over 9 cm in the inner third of the right breast. There were no suspicious calcifications in the left breast. On exam, Dr. Lyman Bishop was able to palpate a small freely mobile mass in the lower inner left breast, but was unable to palpate any abnormalities in the right breast other than some focal thickening.  An subsequent ultrasound of the right breast on 06/16/2012 showed multiple hypoechoic masses throughout the upper quadrants of the right breast. The palpable mass in the 1:00 location corresponded with an irregular hypoechoic mass measuring 1.7 x 1.4 cm. There was also a discrete hypoechoic lobulated mass at the 11:00 position measuring 2.1 x 1.1 x 2.1 cm. The third lesion identified at the 11:00 location, 3 cm from the nipple, measured 1.0 x 0.8 x 0.9 cm.   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  Bilateral breast MRI 06/23/2012 showed an enhancing mass in the upper central right breast measuring 1.8 cm, together with clumped nodular enhancement extending for a total area of 11.4 cm. In the upper outer quadrant of this breast there were 3 discrete  irregular masses measuring up to 3 cm. This correlated well with the ultrasound findings previously. In the left breast there was a large area of clumped nodular enhancement measuring up to 8.9 cm. In the lower central portion there was a 1.7 cm circumscribed nodule consistent with a known fibroadenoma. There weas no axillary or internal mammary adenopathy noted on either side.   The 8.9 cm area of abnormality in the left breast was biopsied on 06/30/2012 showing fibrocystic changes with adenosis and calcifications, pseudoangiomatous stromal hyperplasia, but no evidence of malignancy.  The patient underwent bilateral mastectomies under the care of Dr. Luisa Hart on 07/14/2012. The left mastectomy revealed benign breast tissue and was negative for malignancy. 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 Lymphovascular invasion was identified; #2) a ductal carcinoma in situ, grade 3, with comedo necrosis.  Surgical margins were negative. 4 lymph nodes were examined, one positive for metastatic mammary carcinoma, a second positive for isolated tumor cells. pT2, pN1a  Subsequent history is detailed below.  INTERVAL HISTORY: [name] returns today  for followup of her right breast cancer. She is due for day 1 cycle 3 of 6 planned adjuvant cycles of docetaxel/carboplatin/trastuzumab. She receives Neulasta on day 2 for granulocyte support.  [name] is feeling well overall, and is tolerating the chemotherapy well. Her biggest concerns today are her increased blood pressure, and a feeling of "pressure" in her right eye. She denies any significant changes in vision. She has no known history  of glaucoma. She's had no abnormal headaches, although she does have a history of migraines. She denies any dizziness, gait disturbance, falls, loss of consciousness, head injuries, or seizure activity.   REVIEW OF SYSTEMS: Sherry Barry  has had no fevers, chills, or night sweats. She denies any rashes or skin changes, other than some mild hyperpigmentation of the nailbeds. She's had no abnormal bruising or bleeding. She's had no oral ulcerations or sensitivity. She's eating and drinking well, and is keeping herself well hydrated. She's had no problems with nausea or emesis and denies any change in bowel or bladder habits. She denies any cough, shortness of breath, orthopnea, or chest pain. Her legs ache and feel "sore" but she denies any additional pain elsewhere. She denies any peripheral swelling. She's had no signs of peripheral neuropathy in either the upper or lower extremities.  A detailed review of systems is otherwise stable and noncontributory.   PAST MEDICAL HISTORY: Past Medical History  Diagnosis Date  . Allergy   . Migraine   . Migraine   . Breast cancer   . History of migraine     last one about a week ago    PAST SURGICAL HISTORY: Past Surgical History  Procedure Laterality Date  . Abdominal hysterectomy      no salpingo-oophorectomy  . Simple mastectomy with axillary sentinel node biopsy  07/14/2012    Procedure: SIMPLE MASTECTOMY WITH AXILLARY SENTINEL NODE BIOPSY;  Surgeon: Clovis Pu. Cornett, MD;  Location: [facility] OR;  Service: General;  Laterality: Right;  Bilateral simple mastectomy with port and right sebtibel lymph node mapping  . Simple mastectomy with axillary sentinel node biopsy  07/14/2012    Procedure: SIMPLE MASTECTOMY;  Surgeon: Clovis Pu. Cornett, MD;  Location: [facility] OR;  Service: General;  Laterality: Left;  . Portacath placement  07/14/2012    Procedure: INSERTION PORT-A-CATH;  Surgeon: Clovis Pu. Cornett, MD;  Location: [facility] OR;  Service: General;  Laterality: Left;    FAMILY HISTORY Family History  Problem Relation Age of Onset  . Hypertension Mother   . Alcohol abuse Mother   . Heart disease Maternal Grandmother   . Stroke Maternal Grandfather   . Colon cancer Maternal Aunt   . Brain  cancer Paternal Uncle    the patient's father died in an automobile accident in his early 72s. The patient's mother is living, currently age 30. The patient has one brother and one sister. There is no family history of breast or ovarian cancer, although there are maternal uncles with brain, colon and lymph node cancers.  GYNECOLOGIC HISTORY: Menarche age 48, first live birth age 21, she is GX P2. She stopped having periods with her surgery March of 2009. She does have "premenstrual symptoms" on a regular basis.  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 Her husband Brand Males works for a company in the [place] area doing testing. Their children Jaden (9) and Bryson (4) at home.   ADVANCED DIRECTIVES: not in place  HEALTH MAINTENANCE: History  Substance Use Topics  . Smoking status: Never Smoker   . Smokeless tobacco: Never Used  . Alcohol Use: Yes     Comment: occasional     Colonoscopy: -  PAP: 09/2010  Bone density: -  Lipid panel:  Allergies  Allergen Reactions  . Codeine Itching  . Latex Swelling    Current Outpatient Prescriptions  Medication Sig Dispense Refill  . dexamethasone (DECADRON) 4 MG tablet Take 2 tablets (8  mg total) by mouth 2 (two) times daily with a meal. Take two times a day the day before Taxotere. Then take two times a day starting the day after chemo for 3 days.  30 tablet  1  . lidocaine-prilocaine (EMLA) cream Apply topically as needed. Apply to port 1-2 hours before chemo and cover with plastic wrap  30 g  0  . LORazepam (ATIVAN) 0.5 MG tablet Take 1 tablet (0.5 mg total) by mouth every 6 (six) hours as needed (Nausea or vomiting).  30 tablet  0  . mometasone (NASONEX) 50 MCG/ACT nasal spray Place 2 sprays into the nose daily.      . ondansetron (ZOFRAN) 8 MG tablet Take 1 tablet (8 mg total) by mouth 2 (two) times daily. Take two times a day starting the day after chemo for 3 days. Then take  two times a day as needed for nausea or vomiting.  30 tablet  1  . prochlorperazine (COMPAZINE) 10 MG tablet Take 1 tablet (10 mg total) by mouth every 6 (six) hours as needed (Nausea or vomiting).  30 tablet  1  . prochlorperazine (COMPAZINE) 25 MG suppository Place 1 suppository (25 mg total) rectally every 12 (twelve) hours as needed for nausea.  12 suppository  3  . valACYclovir (VALTREX) 500 MG tablet Take 1 tablet (500 mg total) by mouth 2 (two) times daily.  30 tablet  3  . lisinopril (PRINIVIL) 5 MG tablet Take 1 tablet (5 mg total) by mouth daily.  30 tablet  1   No current facility-administered medications for this visit.    OBJECTIVE: Young-appearing African American woman in no acute distress Filed Vitals:   09/28/12 1133  BP: 158/94  Pulse: 102  Temp: 98.3 F (36.8 C)  Resp: 20     Body mass index is 25.93 kg/(m^2).    ECOG FS: 1 Filed Weights   09/28/12 1133  Weight: 141 lb 12.8 oz (64.32 kg)    Sclerae unicteric Oropharynx clear, with no ulcerations or evidence of candidiasis. No cervical or supraclavicular adenopathy Lungs clear to auscultation, no rales or rhonchi Heart regular rate and rhythm Abdomen soft, nontender, positive bowel sounds MSK no focal spinal tenderness, no peripheral edema Neuro: nonfocal. Well oriented with positive affect. Breasts: Deferred. Both axillae are benign with no palpable adenopathy.   LAB RESULTS: Lab Results  Component Value Date   WBC 15.4* 09/28/2012   NEUTROABS 12.8* 09/28/2012   HGB 12.1 09/28/2012   HCT 35.3 09/28/2012   MCV 87.2 09/28/2012   PLT 225 09/28/2012      Chemistry      Component Value Date/Time   NA 142 09/07/2012 1234   NA 135 07/15/2012 0500   K 3.4* 09/07/2012 1234   K 3.9 07/15/2012 0500   CL 107 09/07/2012 1234   CL 103 07/15/2012 0500   CO2 26 09/07/2012 1234   CO2 25 07/15/2012 0500   BUN 16.8 09/07/2012 1234   BUN 7 07/15/2012 0500   CREATININE 0.8 09/07/2012 1234   CREATININE 0.62 07/15/2012 0500    CREATININE 0.80 05/02/2012 1412      Component Value Date/Time   CALCIUM 9.6 09/07/2012 1234   CALCIUM 8.2* 07/15/2012 0500   ALKPHOS 64 09/07/2012 1234   ALKPHOS 46 07/14/2012 2300   AST 18 09/07/2012 1234   AST 14 07/14/2012 2300   ALT 16 09/07/2012 1234   ALT 7 07/14/2012 2300   BILITOT 0.24 09/07/2012 1234   BILITOT 0.3 07/14/2012 2300  Lab Results  Component Value Date   LABCA2 41* 06/28/2012     STUDIES:  Echocardiogram 08/01/2012 showed an ejection fraction in the 60-65% range.        ASSESSMENT: 42 y.o. [place] woman status post right mastectomy and sentinel lymph node sampling 07/14/2012 (with simple left mastectomy)  for a pT2, pN1, stage IIB invasive ductal carcinoma, grade 3, estrogen receptor 100% and progesterone receptor 100% positive, with an MIB-1 of 48%, and HER-2 amplified by CISH with a ratio of 3.02.  (1) to start adjuvant chemotherapy 08/17/2012, consisting of carboplatin, docetaxel and trastuzumab given every 3 weeks for 6 cycles  (2) trastuzumab to be continued in to March of 2015. Most recent echocardiogram 08/01/2012  (3)  Hypertension  PLAN:  Quintasia will proceed to treatment today as scheduled for day 1 cycle 3 of adjuvant chemotherapy. She'll have her Neulasta injection tomorrow, and I'll see her next week on April 24 for assessment of chemotoxicity.  I am starting her on a low dose of lisinopril, 5 mg daily, for her hypertension. She tells me she is actually taking this in the past with good results.  I am also referring her to see Dr. Maris Berger as soon as possible for further evaluation of the discomfort and feelings of pressure in the right eye.  Otherwise we will continue with our current regimen for a total of 6 cycles, and we will schedule Jazleen out through her sixth dose in June today's. She'll be due for her next echocardiogram in mid May. She voices understanding and agreement with this plan, and will call with any changes or  problems.   Umeka Wrench    09/28/2012

## 2012-09-28 NOTE — Patient Instructions (Addendum)
[facility] Discharge Instructions for Patients Receiving Chemotherapy  Today you received the following chemotherapy agents Taxotere/Carboplatin/Herceptin.  To help prevent nausea and vomiting after your treatment, we encourage you to take your nausea medication as prescribed.   If you develop nausea and vomiting that is not controlled by your nausea medication, call the clinic. If it is after clinic hours your family physician or the after hours number for the clinic or go to the Emergency Department.   BELOW ARE SYMPTOMS THAT SHOULD BE REPORTED IMMEDIATELY:  *FEVER GREATER THAN 100.5 F  *CHILLS WITH OR WITHOUT FEVER  NAUSEA AND VOMITING THAT IS NOT CONTROLLED WITH YOUR NAUSEA MEDICATION  *UNUSUAL SHORTNESS OF BREATH  *UNUSUAL BRUISING OR BLEEDING  TENDERNESS IN MOUTH AND THROAT WITH OR WITHOUT PRESENCE OF ULCERS  *URINARY PROBLEMS  *BOWEL PROBLEMS  UNUSUAL RASH Items with * indicate a potential emergency and should be followed up as soon as possible.  Feel free to call the clinic you have any questions or concerns. The clinic phone number is [phone].   I have been informed and understand all the instructions given to me. I know to contact the clinic, my physician, or go to the Emergency Department if any problems should occur. I do not have any questions at this time, but understand that I may call the clinic during office hours   should I have any questions or need assistance in obtaining follow up care.    __________________________________________  _____________  __________ Signature of Patient or Authorized Representative            Date                   Time    __________________________________________ Nurse's Signature    

## 2012-09-29 ENCOUNTER — Ambulatory Visit: Payer: BC Managed Care – PPO

## 2012-09-29 ENCOUNTER — Ambulatory Visit (HOSPITAL_BASED_OUTPATIENT_CLINIC_OR_DEPARTMENT_OTHER): Payer: BC Managed Care – PPO

## 2012-09-29 VITALS — BP 151/102 | HR 108 | Temp 98.4°F

## 2012-09-29 DIAGNOSIS — C50219 Malignant neoplasm of upper-inner quadrant of unspecified female breast: Secondary | ICD-10-CM

## 2012-09-29 DIAGNOSIS — Z5189 Encounter for other specified aftercare: Secondary | ICD-10-CM

## 2012-09-29 MED ORDER — PEGFILGRASTIM INJECTION 6 MG/0.6ML
6.0000 mg | Freq: Once | SUBCUTANEOUS | Status: AC
  Administered 2012-09-29: 6 mg via SUBCUTANEOUS
  Filled 2012-09-29: qty 0.6

## 2012-09-29 NOTE — Patient Instructions (Signed)
Call MD for problems 

## 2012-10-04 ENCOUNTER — Encounter: Payer: Self-pay | Admitting: Oncology

## 2012-10-04 NOTE — Progress Notes (Signed)
Location of Breast Cancer: right breast  Histology per Pathology Report: invasive ductal carcinoma, grade III, with comedo necrosis  Receptor Status: ER(+), PR (+), Her2-neu (positive)  Did patient present with symptoms (if so, please note symptoms) or was this found on screening mammography?:     Past/Anticipated interventions by surgeon, if any: bil masectomy  Past/Anticipated interventions by medical oncology, if any: chemotherapy 6 cycles of docetaxel/carboplatin/trastuzumab  Lymphedema issues, if any:     Pain issues, if any:     SAFETY ISSUES:  Prior radiation?    Pacemaker/ICD?    Possible current pregnancy?    Is the patient on methotrexate?    Current Complaints / other details:

## 2012-10-04 NOTE — Progress Notes (Deleted)
x

## 2012-10-05 ENCOUNTER — Encounter: Payer: Self-pay | Admitting: Radiation Oncology

## 2012-10-05 ENCOUNTER — Ambulatory Visit
Admission: RE | Admit: 2012-10-05 | Discharge: 2012-10-05 | Disposition: A | Discharge status: Home / Self Care | Payer: BC Managed Care – PPO | Source: Ambulatory Visit | Attending: Radiation Oncology | Admitting: Radiation Oncology

## 2012-10-05 ENCOUNTER — Encounter: Payer: Self-pay | Admitting: Physician Assistant

## 2012-10-05 ENCOUNTER — Telehealth: Payer: Self-pay | Admitting: *Deleted

## 2012-10-05 ENCOUNTER — Ambulatory Visit (HOSPITAL_BASED_OUTPATIENT_CLINIC_OR_DEPARTMENT_OTHER): Payer: BC Managed Care – PPO | Admitting: Physician Assistant

## 2012-10-05 ENCOUNTER — Other Ambulatory Visit (HOSPITAL_BASED_OUTPATIENT_CLINIC_OR_DEPARTMENT_OTHER): Payer: BC Managed Care – PPO | Admitting: Lab

## 2012-10-05 VITALS — BP 116/85 | HR 119 | Temp 99.2°F | Resp 20 | Ht 62.5 in | Wt 137.9 lb

## 2012-10-05 VITALS — BP 138/91 | HR 125 | Temp 98.7°F | Resp 20 | Ht 62.5 in | Wt 137.5 lb

## 2012-10-05 DIAGNOSIS — Z79899 Other long term (current) drug therapy: Secondary | ICD-10-CM | POA: Insufficient documentation

## 2012-10-05 DIAGNOSIS — C50211 Malignant neoplasm of upper-inner quadrant of right female breast: Secondary | ICD-10-CM

## 2012-10-05 DIAGNOSIS — C50219 Malignant neoplasm of upper-inner quadrant of unspecified female breast: Secondary | ICD-10-CM

## 2012-10-05 DIAGNOSIS — R5381 Other malaise: Secondary | ICD-10-CM

## 2012-10-05 DIAGNOSIS — Z170 Estrogen receptor positive status [ER+]: Secondary | ICD-10-CM

## 2012-10-05 DIAGNOSIS — C50919 Malignant neoplasm of unspecified site of unspecified female breast: Secondary | ICD-10-CM | POA: Insufficient documentation

## 2012-10-05 DIAGNOSIS — I10 Essential (primary) hypertension: Secondary | ICD-10-CM

## 2012-10-05 DIAGNOSIS — C779 Secondary and unspecified malignant neoplasm of lymph node, unspecified: Secondary | ICD-10-CM | POA: Insufficient documentation

## 2012-10-05 DIAGNOSIS — C50911 Malignant neoplasm of unspecified site of right female breast: Secondary | ICD-10-CM

## 2012-10-05 HISTORY — DX: Essential (primary) hypertension: I10

## 2012-10-05 LAB — CBC WITH DIFFERENTIAL/PLATELET
BASO%: 0.2 % (ref 0.0–2.0)
Basophils Absolute: 0 10*3/uL (ref 0.0–0.1)
EOS%: 0.8 % (ref 0.0–7.0)
HGB: 13.3 g/dL (ref 11.6–15.9)
MCH: 29.8 pg (ref 25.1–34.0)
MONO#: 1.3 10*3/uL — ABNORMAL HIGH (ref 0.1–0.9)
RDW: 14 % (ref 11.2–14.5)
WBC: 8.7 10*3/uL (ref 3.9–10.3)
lymph#: 2.1 10*3/uL (ref 0.9–3.3)

## 2012-10-05 NOTE — Telephone Encounter (Signed)
appts made and printed...td 

## 2012-10-05 NOTE — Progress Notes (Signed)
CC Dr. 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 I first saw the patient at the [facility] on 11/26/2012. A mammogram on 06/05/2012 showed highly suspicious calcifications involving most of the inner third of the right breast extending over 9 cm. Ultrasound on 06/16/2012 showed multiple suspicious masses within the right breast some of which were felt to represent fibroadenomas but other suspicious for invasive ductal carcinoma. Right breast biopsy on 06/16/2012 was diagnostic for invasive ductal carcinoma with DCIS. Invasive tumor was grade 2/3 and found to be ER positive at 100% and PR at 100% with an elevated proliferation marker of 48%. Her tumor was found to be HER-2/neu positive. She went bilateral mastectomies along with a sentinel lymph node biopsy on 07/14/2012. She was found have a 2.2 cm primary within the upper medial aspect of the right breast with an adjacent 1.2 cm invasive ductal carcinoma.  Margins for both invasive carcinoma and DCIS were at least 2.5 cm. Her invasive disease was grade 3 and her DCIS was high grade as well. There was LV I. One of 4 lymph nodes contained metastatic disease in this was a macro metastasis. Of note is that there were isolated tumor cells in one of 4 lymph nodes. She is currently receiving carboplatin, docetaxel and trastuzumab chemotherapy and received her third cycle last week. She expects to finish her chemotherapy by June 19. She still doing well, but she is been having some blood pressure issues. She continues to work but on a reduced schedule.  Physical examination: Alert and oriented. Filed Vitals:   10/05/12 0829  BP: 116/85  Pulse:  119  Temp: 99.2 F (37.3 C)  Resp: 20   Head and neck examination: She wears a wig. Nodes: Without palpable cervical, supraclavicular, or axillary lymphadenopathy. Chest: Bilateral mastectomies with no visible or palpable evidence for recurrent disease. Left anterior Port-A-Cath. Lungs clear. Back: Without spinal or CVA tenderness. Heart: Regular rate rhythm. Abdomen: Without masses organomegaly. Extremities: Without edema. Neurologic examination: Grossly nonfocal.  Laboratory data: Lab Results  Component Value Date   WBC 15.4* 09/28/2012   HGB 12.1 09/28/2012   HCT 35.3 09/28/2012   MCV 87.2 09/28/2012   PLT 225 09/28/2012   CMP     Component Value Date/Time   NA 140 09/28/2012 1121   NA 135 07/15/2012 0500   K 3.9 09/28/2012 1121   K 3.9 07/15/2012 0500   CL 105 09/28/2012 1121   CL 103 07/15/2012 0500   CO2 26 09/28/2012 1121   CO2 25 07/15/2012 0500   GLUCOSE 100* 09/28/2012 1121   GLUCOSE 118* 07/15/2012 0500   BUN 18.2 09/28/2012 1121   BUN 7 07/15/2012 0500   CREATININE 0.8 09/28/2012 1121   CREATININE 0.62 07/15/2012 0500   CREATININE 0.80 05/02/2012 1412   CALCIUM 9.7 09/28/2012 1121   CALCIUM 8.2* 07/15/2012 0500   PROT 6.9 09/28/2012 1121   PROT 6.4 07/14/2012 2300   ALBUMIN 3.5 09/28/2012 1121   ALBUMIN 3.2* 07/14/2012 2300   AST 38* 09/28/2012 1121   AST 14 07/14/2012 2300   ALT 59* 09/28/2012 1121   ALT 7 07/14/2012 2300   ALKPHOS 65 09/28/2012 1121   ALKPHOS 46 07/14/2012 2300   BILITOT  0.24 09/28/2012 1121   BILITOT 0.3 07/14/2012 2300   GFRNONAA >90 07/15/2012 0500   GFRAA >90 07/15/2012 0500   Impression: Pathologic stage IIB (T2 N1 M0) multifocal invasive ductal/DCIS of the right breast. We discussed indications for the benefit of post mastectomy radiation therapy. Involvement of one of 4 lymph nodes is a primary indication, and even if she had a completion axillary node dissection I would still recommend post mastectomy radiation therapy. Other indications include the presence of  lymphovascular space invasion along with grade 3 carcinoma. Lastly, I favor radiation therapy based on her young age. We discussed the potential acute and late toxicities of radiation therapy and consent is signed today. I would like to see her back nearing completion of chemotherapy and I will go ahead and set up a followup visit for her to see me in mid to late June.  Plan: As discussed above.  30 minutes was spent face-to-face with the patient, primarily counseling the patient and coordinating her care.

## 2012-10-05 NOTE — Progress Notes (Signed)
Breast  Clinic follow up, double mastectomy Alert,oriented x3, married, 2 sons,(G5,P2), [employer], no c/o pain or discomfort, energy level good today, next Chemotherpay may 8, , paternal great aunt and paternal aunt both deceased breast cancer, No history radiation, no history of a pacemaker, menses age e34, first live pregnancy age 42 8:38 AM

## 2012-10-05 NOTE — Telephone Encounter (Signed)
Per staff phone call and POF I have schedueld appts.  JMW  

## 2012-10-05 NOTE — Progress Notes (Signed)
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 At that time a digital bilateal diagnostic mammogram showed a slight decrease in size of the benign fibroadenoma in the left breast compared to 2010. The same mammogram, however, also refilled pleomorphic calcifications extending over 9 cm in the inner third of the right breast. There were no suspicious calcifications in the left breast. On exam, Dr. Lyman Bishop was able to palpate a small freely mobile mass in the lower inner left breast, but was unable to palpate any abnormalities in the right breast other than some focal thickening.  An subsequent ultrasound of the right breast on 06/16/2012 showed multiple hypoechoic masses throughout the upper quadrants of the right breast. The palpable mass in the 1:00 location corresponded with an irregular hypoechoic mass measuring 1.7 x 1.4 cm. There was also a discrete hypoechoic lobulated mass at the 11:00 position measuring 2.1 x 1.1 x 2.1 cm. The third lesion identified at the 11:00 location, 3 cm from the nipple, measured 1.0 x 0.8 x 0.9 cm.   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  Bilateral breast MRI 06/23/2012 showed an enhancing mass in the upper central right breast measuring 1.8 cm, together with clumped nodular enhancement extending for a total area of 11.4 cm. In the upper outer quadrant of this breast there were 3 discrete  irregular masses measuring up to 3 cm. This correlated well with the ultrasound findings previously. In the left breast there was a large area of clumped nodular enhancement measuring up to 8.9 cm. In the lower central portion there was a 1.7 cm circumscribed nodule consistent with a known fibroadenoma. There weas no axillary or internal mammary adenopathy noted on either side.   The 8.9 cm area of abnormality in the left breast was biopsied on 06/30/2012 showing fibrocystic changes with adenosis and calcifications, pseudoangiomatous stromal hyperplasia, but no evidence of malignancy.  The patient underwent bilateral mastectomies under the care of Dr. Luisa Hart on 07/14/2012. The left mastectomy revealed benign breast tissue and was negative for malignancy. 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 Lymphovascular invasion was identified; #2) a ductal carcinoma in situ, grade 3, with comedo necrosis.  Surgical margins were negative. 4 lymph nodes were examined, one positive for metastatic mammary carcinoma, a second positive for isolated tumor cells. pT2, pN1a  Subsequent history is detailed below.  INTERVAL HISTORY: Sherry Barry returns today  for followup of her right breast cancer. She is currently day 8 cycle 3 of 6 planned adjuvant cycles of docetaxel/carboplatin/trastuzumab. She receives Neulasta on day 2 for granulocyte support.  Sherry Barry is feeling generally well today. She has had some bony aches and pains associated with the Neulasta injection, and she still feels fatigued, but otherwise feels that she is recovering well from cycle 3. She's had no signs of peripheral neuropathy  in either the upper or lower extremities. She still has some blurred vision. She was evaluated last week by Dr. Charlotte Sanes and tells me her eye exam was "okay". She scheduled to followup there in the next week or 2.   REVIEW OF  SYSTEMS: Sherry Barry has had no fevers, chills, or night sweats. She denies any rashes or skin changes, other than some mild hyperpigmentation of the nailbeds. There's been no drainage or evidence of infection. She's had no abnormal bruising or bleeding. She's had no oral ulcerations or sensitivity. She's eating and drinking well, and is keeping herself well hydrated. She's had no problems with nausea or emesis and denies any change in bowel or bladder habits. She denies any cough, shortness of breath, orthopnea, or chest pain. She's had no abnormal headaches or dizziness. She denies any peripheral swelling.   A detailed review of systems is otherwise stable and noncontributory.   PAST MEDICAL HISTORY: Past Medical History  Diagnosis Date  . Allergy   . Migraine   . Migraine   . History of migraine     last one about a week ago  . History of chemotherapy     doxetaxel/carboplatin/trastuzumab  . Breast cancer   . Breast cancer     right  . Hypertension     PAST SURGICAL HISTORY: Past Surgical History  Procedure Laterality Date  . Simple mastectomy with axillary sentinel node biopsy  07/14/2012    Procedure: SIMPLE MASTECTOMY WITH AXILLARY SENTINEL NODE BIOPSY;  Surgeon: Clovis Pu. Cornett, MD;  Location: [facility] OR;  Service: General;  Laterality: Right;  Bilateral simple mastectomy with port and right sebtibel lymph node mapping  . Simple mastectomy with axillary sentinel node biopsy  07/14/2012    Procedure: SIMPLE MASTECTOMY;  Surgeon: Clovis Pu. Cornett, MD;  Location: [facility] OR;  Service: General;  Laterality: Left;  . Portacath placement  07/14/2012    Procedure: INSERTION PORT-A-CATH;  Surgeon: Clovis Pu. Cornett, MD;  Location: [facility] OR;  Service: General;  Laterality: Left;  . Abdominal hysterectomy      no salpingo-oophorectomy    FAMILY HISTORY Family History  Problem Relation Age of Onset  . Hypertension Mother   . Alcohol abuse Mother   . Heart disease Maternal Grandmother   . Stroke  Maternal Grandfather   . Colon cancer Maternal Aunt   . Brain cancer Paternal Uncle   . Cancer Paternal Aunt     breast ca  . Cancer Paternal Aunt     breast   the patient's father died in an automobile accident in his early 31s. The patient's mother is living, currently age 64. The patient has one brother and one sister. There is no family history of breast or ovarian cancer, although there are maternal uncles with brain, colon and lymph node cancers.  GYNECOLOGIC HISTORY: Menarche age 16, first live birth age 75, she is GX P2. She stopped having periods with her surgery March of 2009. She does have "premenstrual symptoms" on a regular basis.  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 Her husband Brand Males works for a company in the [place] doing testing. Their children Jaden (9) and Bryson (4) at home.   ADVANCED DIRECTIVES: not in place  HEALTH MAINTENANCE: History  Substance Use Topics  . Smoking status: Never Smoker   . Smokeless tobacco: Never Used  . Alcohol Use: Yes     Comment: occasional     Colonoscopy: -  PAP:  09/2010  Bone density: -  Lipid panel:  Allergies  Allergen Reactions  . Codeine Itching  . Latex Swelling    Current Outpatient Prescriptions  Medication Sig Dispense Refill  . dexamethasone (DECADRON) 4 MG tablet Take 2 tablets (8 mg total) by mouth 2 (two) times daily with a meal. Take two times a day the day before Taxotere. Then take two times a day starting the day after chemo for 3 days.  30 tablet  1  . lidocaine-prilocaine (EMLA) cream Apply topically as needed. Apply to port 1-2 hours before chemo and cover with plastic wrap  30 g  0  . lisinopril (PRINIVIL) 5 MG tablet Take 1 tablet (5 mg total) by mouth daily.  30 tablet  1  . LORazepam (ATIVAN) 0.5 MG tablet Take 1 tablet (0.5 mg total) by mouth every 6 (six) hours as needed (Nausea or vomiting).  30 tablet  0  . mometasone  (NASONEX) 50 MCG/ACT nasal spray Place 2 sprays into the nose daily.      . ondansetron (ZOFRAN) 8 MG tablet Take 1 tablet (8 mg total) by mouth 2 (two) times daily. Take two times a day starting the day after chemo for 3 days. Then take two times a day as needed for nausea or vomiting.  30 tablet  1  . prochlorperazine (COMPAZINE) 10 MG tablet Take 1 tablet (10 mg total) by mouth every 6 (six) hours as needed (Nausea or vomiting).  30 tablet  1  . prochlorperazine (COMPAZINE) 25 MG suppository Place 1 suppository (25 mg total) rectally every 12 (twelve) hours as needed for nausea.  12 suppository  3  . valACYclovir (VALTREX) 500 MG tablet Take 1 tablet (500 mg total) by mouth 2 (two) times daily.  30 tablet  3   No current facility-administered medications for this visit.    OBJECTIVE: Young-appearing African American woman in no acute distress Filed Vitals:   10/05/12 1400  BP: 138/91  Pulse: 125  Temp: 98.7 F (37.1 C)  Resp: 20     Body mass index is 24.73 kg/(m^2).    ECOG FS: 1 Filed Weights   10/05/12 1400  Weight: 137 lb 8 oz (62.37 kg)    Sclerae unicteric Oropharynx clear, with no ulcerations or evidence of candidiasis. No cervical or supraclavicular adenopathy Lungs clear to auscultation, no wheezes or rhonchi Heart regular rate and rhythm Abdomen soft, nontender, positive bowel sounds MSK no focal spinal tenderness, no peripheral edema Neuro: nonfocal. Well oriented with positive affect. Breasts: Deferred. Both axillae are benign with no palpable adenopathy.   LAB RESULTS: Lab Results  Component Value Date   WBC 8.7 10/05/2012   NEUTROABS 5.3 10/05/2012   HGB 13.3 10/05/2012   HCT 39.7 10/05/2012   MCV 89.0 10/05/2012   PLT 198 10/05/2012      Chemistry      Component Value Date/Time   NA 140 09/28/2012 1121   NA 135 07/15/2012 0500   K 3.9 09/28/2012 1121   K 3.9 07/15/2012 0500   CL 105 09/28/2012 1121   CL 103 07/15/2012 0500   CO2 26 09/28/2012 1121   CO2 25  07/15/2012 0500   BUN 18.2 09/28/2012 1121   BUN 7 07/15/2012 0500   CREATININE 0.8 09/28/2012 1121   CREATININE 0.62 07/15/2012 0500   CREATININE 0.80 05/02/2012 1412      Component Value Date/Time   CALCIUM 9.7 09/28/2012 1121   CALCIUM 8.2* 07/15/2012 0500   ALKPHOS 65 09/28/2012  1121   ALKPHOS 46 07/14/2012 2300   AST 38* 09/28/2012 1121   AST 14 07/14/2012 2300   ALT 59* 09/28/2012 1121   ALT 7 07/14/2012 2300   BILITOT 0.24 09/28/2012 1121   BILITOT 0.3 07/14/2012 2300       Lab Results  Component Value Date   LABCA2 41* 06/28/2012     STUDIES:  Echocardiogram 08/01/2012 showed an ejection fraction in the 60-65% range.        ASSESSMENT: 42 y.o. [place] woman status post right mastectomy and sentinel lymph node sampling 07/14/2012 (with simple left mastectomy)  for a pT2, pN1, stage IIB invasive ductal carcinoma, grade 3, estrogen receptor 100% and progesterone receptor 100% positive, with an MIB-1 of 48%, and HER-2 amplified by CISH with a ratio of 3.02.  (1) to start adjuvant chemotherapy 08/17/2012, consisting of carboplatin, docetaxel and trastuzumab given every 3 weeks for 6 cycles  (2) trastuzumab to be continued in to March of 2015. Most recent echocardiogram 08/01/2012  (3)  Hypertension, better controlled on lisinopril since 09/28/2012  PLAN:  Tiffani is tolerating her treatment reasonably well. Her blood pressure has improved on the low dose lisinopril, and we are currently making no changes to her treatment plan. I will see her back in 2 weeks on May 8 for followup prior to day 1 cycle 4. She is already scheduled for her repeat echocardiogram in mid May.  Patient voices understanding and agreement with our plan, and will call any changes or problems prior to her next appointment.   Sherry Barry    10/05/2012

## 2012-10-11 ENCOUNTER — Ambulatory Visit (HOSPITAL_BASED_OUTPATIENT_CLINIC_OR_DEPARTMENT_OTHER): Payer: BC Managed Care – PPO | Admitting: Genetic Counselor

## 2012-10-11 ENCOUNTER — Other Ambulatory Visit (HOSPITAL_BASED_OUTPATIENT_CLINIC_OR_DEPARTMENT_OTHER): Payer: BC Managed Care – PPO | Admitting: Lab

## 2012-10-11 DIAGNOSIS — C50911 Malignant neoplasm of unspecified site of right female breast: Secondary | ICD-10-CM

## 2012-10-11 DIAGNOSIS — Z800 Family history of malignant neoplasm of digestive organs: Secondary | ICD-10-CM

## 2012-10-11 DIAGNOSIS — Z803 Family history of malignant neoplasm of breast: Secondary | ICD-10-CM

## 2012-10-11 DIAGNOSIS — IMO0002 Reserved for concepts with insufficient information to code with codable children: Secondary | ICD-10-CM

## 2012-10-11 DIAGNOSIS — C50919 Malignant neoplasm of unspecified site of unspecified female breast: Secondary | ICD-10-CM

## 2012-10-11 DIAGNOSIS — Z808 Family history of malignant neoplasm of other organs or systems: Secondary | ICD-10-CM

## 2012-10-11 LAB — CBC WITH DIFFERENTIAL/PLATELET
Eosinophils Absolute: 0 10*3/uL (ref 0.0–0.5)
HGB: 13.2 g/dL (ref 11.6–15.9)
MONO#: 0.7 10*3/uL (ref 0.1–0.9)
NEUT#: 10.1 10*3/uL — ABNORMAL HIGH (ref 1.5–6.5)
Platelets: 201 10*3/uL (ref 145–400)
RBC: 4.46 10*6/uL (ref 3.70–5.45)
RDW: 15.5 % — ABNORMAL HIGH (ref 11.2–14.5)
WBC: 13 10*3/uL — ABNORMAL HIGH (ref 3.9–10.3)

## 2012-10-11 NOTE — Progress Notes (Signed)
Dr. Wynelle Link requested a cancer genetics consultation for Sherry Barry, a 42 y.o. female, due to a personal and family history of cancer.  Sherry Barry presents to clinic today to discuss the possibility of a hereditary predisposition to cancer, genetic testing, and to further clarify her future cancer risks, as well as potential cancer risk for family members.   HISTORY OF PRESENT ILLNESS: Sherry Barry was diagnosed with right breast cancer at the age of 83. She is s/p bilateral mastectomies and is currently receiving adjuvant chemotherapy with radiation planned in June. The breast tumor is ER positive, PR positive, Her2Neu positive. She has no history of other cancer.   Past Medical History  Diagnosis Date   Allergy    Migraine    Migraine    History of migraine     last one about a week ago   History of chemotherapy     doxetaxel/carboplatin/trastuzumab   Breast cancer    Breast cancer     right   Hypertension     Past Surgical History  Procedure Laterality Date   Simple mastectomy with axillary sentinel node biopsy  07/14/2012    Procedure: SIMPLE MASTECTOMY WITH AXILLARY SENTINEL NODE BIOPSY;  Surgeon: Clovis Pu. Cornett, MD;  Location: [facility] OR;  Service: General;  Laterality: Right;  Bilateral simple mastectomy with port and right sebtibel lymph node mapping   Simple mastectomy with axillary sentinel node biopsy  07/14/2012    Procedure: SIMPLE MASTECTOMY;  Surgeon: Clovis Pu. Cornett, MD;  Location: [facility];  Service: General;  Laterality: Left;   Portacath placement  07/14/2012    Procedure: INSERTION PORT-A-CATH;  Surgeon: Clovis Pu. 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 Significant diagnoses include the following:  Family History  Problem Relation Age of Onset   Hypertension Mother    Alcohol abuse Mother    Heart disease Maternal Grandmother    Stroke Maternal Grandfather    Colon cancer Maternal Aunt 55    alive at 81   Brain cancer Maternal Uncle 9    and lymphoma in early 63s; deceased   Brain cancer Maternal Uncle 92    deceased   Pancreatic cancer Maternal Uncle 45    alive at 27   Breast cancer Maternal Aunt     great aunt through mat GF; dx at ? age   Breast cancer Cousin 18    mat 1st cousin once removed through mat GF ; deceased  Sherry Barry ancestry is of African American descent. There is no known consanguinity.  GENETIC COUNSELING ASSESSMENT: Sherry Barry is a 42 y.o. female with a personal and family history of breast cancer, colon cancer, pancreatic cancer and brain. This history is suggestive of a hereditary predisposition to cancer and we, therefore, discussed and recommended the following at today's visit.   DISCUSSION: We reviewed the characteristics, features and inheritance patterns of hereditary cancer syndromes. We also discussed genetic testing, including the appropriate family members to test, the process of testing, insurance coverage and turn-around-time for results. We recommended  Sherry Barry pursue genetic testing for the Comprehensive Cancer Panel at [facility] given the varied cancer diagnoses in the family in combination with Sherry Barry's young age of onset of breast cancer.   PLAN: Sherry Barry wished to pursue genetic testing and the blood sample will be sent to [facility] for analysis of the Comprehensive Cancer Panel.  We discussed the implications of a positive, negative and/ or variant of uncertain significance genetic test result. Results should be available within approximately 8 weeks time, at which point they will be disclosed by telephone to Sherry Barry, as will any additional recommendations warranted by these results. Sherry Barry will receive a summary of her genetic counseling visit and a copy of her results once available. This information will also be available in Epic.     We encouraged Sherry Barry to remain in contact with cancer genetics annually so that we can continuously update the family history and inform her of any changes in cancer genetics and testing that may be of benefit for this family. [name] questions were answered to her satisfaction today. Our contact information was provided should additional questions or concerns arise. Thank you for the referral and allowing [place] to share in the care of your patient.   The patient was seen for a total of 40 minutes, greater than 50% of which was spent face-to-face counseling.  This patient was discussed with Dr. Drue Second and she agrees with the above.   _______________________________________________________________________ For Office Staff:  Number of people involved in session: 2 Was an Intern/ student involved with case: no

## 2012-10-11 NOTE — Addendum Note (Signed)
Encounter addended by: Eduardo Osier, RN on: 10/11/2012  6:42 PM<BR>     Documentation filed: Charges VN

## 2012-10-19 ENCOUNTER — Ambulatory Visit (HOSPITAL_BASED_OUTPATIENT_CLINIC_OR_DEPARTMENT_OTHER): Payer: BC Managed Care – PPO

## 2012-10-19 ENCOUNTER — Other Ambulatory Visit: Payer: Self-pay | Admitting: Oncology

## 2012-10-19 ENCOUNTER — Encounter: Payer: Self-pay | Admitting: Physician Assistant

## 2012-10-19 ENCOUNTER — Ambulatory Visit (HOSPITAL_BASED_OUTPATIENT_CLINIC_OR_DEPARTMENT_OTHER): Payer: BC Managed Care – PPO | Admitting: Physician Assistant

## 2012-10-19 ENCOUNTER — Other Ambulatory Visit (HOSPITAL_BASED_OUTPATIENT_CLINIC_OR_DEPARTMENT_OTHER): Payer: BC Managed Care – PPO | Admitting: Lab

## 2012-10-19 VITALS — BP 147/99 | HR 121 | Temp 98.7°F | Resp 20 | Ht 62.0 in | Wt 145.8 lb

## 2012-10-19 DIAGNOSIS — Z170 Estrogen receptor positive status [ER+]: Secondary | ICD-10-CM

## 2012-10-19 DIAGNOSIS — C50211 Malignant neoplasm of upper-inner quadrant of right female breast: Secondary | ICD-10-CM

## 2012-10-19 DIAGNOSIS — C50219 Malignant neoplasm of upper-inner quadrant of unspecified female breast: Secondary | ICD-10-CM

## 2012-10-19 DIAGNOSIS — Z5111 Encounter for antineoplastic chemotherapy: Secondary | ICD-10-CM

## 2012-10-19 DIAGNOSIS — C50911 Malignant neoplasm of unspecified site of right female breast: Secondary | ICD-10-CM

## 2012-10-19 DIAGNOSIS — Z5112 Encounter for antineoplastic immunotherapy: Secondary | ICD-10-CM

## 2012-10-19 DIAGNOSIS — I10 Essential (primary) hypertension: Secondary | ICD-10-CM

## 2012-10-19 LAB — COMPREHENSIVE METABOLIC PANEL (CC13)
ALT: 41 U/L (ref 0–55)
Albumin: 3.5 g/dL (ref 3.5–5.0)
CO2: 26 mEq/L (ref 22–29)
Glucose: 97 mg/dl (ref 70–99)
Potassium: 3.8 mEq/L (ref 3.5–5.1)
Sodium: 140 mEq/L (ref 136–145)
Total Protein: 6.7 g/dL (ref 6.4–8.3)

## 2012-10-19 LAB — CBC WITH DIFFERENTIAL/PLATELET
Basophils Absolute: 0 10*3/uL (ref 0.0–0.1)
Eosinophils Absolute: 0 10*3/uL (ref 0.0–0.5)
HCT: 35.8 % (ref 34.8–46.6)
HGB: 12.1 g/dL (ref 11.6–15.9)
LYMPH%: 9.6 % — ABNORMAL LOW (ref 14.0–49.7)
MONO#: 0.8 10*3/uL (ref 0.1–0.9)
NEUT#: 10.5 10*3/uL — ABNORMAL HIGH (ref 1.5–6.5)
NEUT%: 84 % — ABNORMAL HIGH (ref 38.4–76.8)
Platelets: 189 10*3/uL (ref 145–400)
WBC: 12.5 10*3/uL — ABNORMAL HIGH (ref 3.9–10.3)
nRBC: 0 % (ref 0–0)

## 2012-10-19 MED ORDER — DOCETAXEL CHEMO INJECTION 160 MG/16ML
75.0000 mg/m2 | Freq: Once | INTRAVENOUS | Status: AC
  Administered 2012-10-19: 120 mg via INTRAVENOUS
  Filled 2012-10-19: qty 12

## 2012-10-19 MED ORDER — TRASTUZUMAB CHEMO INJECTION 440 MG
6.0000 mg/kg | Freq: Once | INTRAVENOUS | Status: AC
  Administered 2012-10-19: 357 mg via INTRAVENOUS
  Filled 2012-10-19: qty 17

## 2012-10-19 MED ORDER — SODIUM CHLORIDE 0.9 % IJ SOLN
10.0000 mL | INTRAMUSCULAR | Status: DC | PRN
  Administered 2012-10-19: 10 mL
  Filled 2012-10-19: qty 10

## 2012-10-19 MED ORDER — ONDANSETRON 16 MG/50ML IVPB (CHCC)
16.0000 mg | Freq: Once | INTRAVENOUS | Status: AC
  Administered 2012-10-19: 16 mg via INTRAVENOUS

## 2012-10-19 MED ORDER — HEPARIN SOD (PORK) LOCK FLUSH 100 UNIT/ML IV SOLN
500.0000 [IU] | Freq: Once | INTRAVENOUS | Status: AC | PRN
  Administered 2012-10-19: 500 [IU]
  Filled 2012-10-19: qty 5

## 2012-10-19 MED ORDER — DIPHENHYDRAMINE HCL 25 MG PO CAPS
50.0000 mg | ORAL_CAPSULE | Freq: Once | ORAL | Status: AC
  Administered 2012-10-19: 50 mg via ORAL

## 2012-10-19 MED ORDER — ACETAMINOPHEN 325 MG PO TABS
650.0000 mg | ORAL_TABLET | Freq: Once | ORAL | Status: AC
  Administered 2012-10-19: 650 mg via ORAL

## 2012-10-19 MED ORDER — SODIUM CHLORIDE 0.9 % IV SOLN
750.0000 mg | Freq: Once | INTRAVENOUS | Status: AC
  Administered 2012-10-19: 750 mg via INTRAVENOUS
  Filled 2012-10-19: qty 75

## 2012-10-19 MED ORDER — DEXAMETHASONE SODIUM PHOSPHATE 20 MG/5ML IJ SOLN
20.0000 mg | Freq: Once | INTRAMUSCULAR | Status: AC
  Administered 2012-10-19: 20 mg via INTRAVENOUS

## 2012-10-19 MED ORDER — SODIUM CHLORIDE 0.9 % IV SOLN
Freq: Once | INTRAVENOUS | Status: AC
  Administered 2012-10-19: 13:00:00 via INTRAVENOUS

## 2012-10-19 NOTE — Patient Instructions (Signed)
[facility] Discharge Instructions for Patients Receiving Chemotherapy  Today you received the following chemotherapy agents taxotere, carboplatin and herceptin.  To help prevent nausea and vomiting after your treatment, we encourage you to take your nausea medication zofran and ativan. Begin taking it  tonight and take it as often as prescribed.   If you develop nausea and vomiting that is not controlled by your nausea medication, call the clinic. If it is after clinic hours your family physician or the after hours number for the clinic or go to the Emergency Department.   BELOW ARE SYMPTOMS THAT SHOULD BE REPORTED IMMEDIATELY:  *FEVER GREATER THAN 100.5 F  *CHILLS WITH OR WITHOUT FEVER  NAUSEA AND VOMITING THAT IS NOT CONTROLLED WITH YOUR NAUSEA MEDICATION  *UNUSUAL SHORTNESS OF BREATH  *UNUSUAL BRUISING OR BLEEDING  TENDERNESS IN MOUTH AND THROAT WITH OR WITHOUT PRESENCE OF ULCERS  *URINARY PROBLEMS  *BOWEL PROBLEMS  UNUSUAL RASH Items with * indicate a potential emergency and should be followed up as soon as possible.  . Feel free to call the clinic you have any questions or concerns. The clinic phone number is [phone].   I have been informed and understand all the instructions given to me. I know to contact the clinic, my physician, or go to the Emergency Department if any problems should occur. I do not have any questions at this time, but understand that I may call the clinic during office hours   should I have any questions or need assistance in obtaining follow up care.    __________________________________________  _____________  __________ Signature of Patient or Authorized Representative            Date                   Time    __________________________________________ Nurse's Signature

## 2012-10-19 NOTE — Progress Notes (Signed)
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 At that time a digital bilateal diagnostic mammogram showed a slight decrease in size of the benign fibroadenoma in the left breast compared to 2010. The same mammogram, however, also refilled pleomorphic calcifications extending over 9 cm in the inner third of the right breast. There were no suspicious calcifications in the left breast. On exam, Dr. Lyman Bishop was able to palpate a small freely mobile mass in the lower inner left breast, but was unable to palpate any abnormalities in the right breast other than some focal thickening.  An subsequent ultrasound of the right breast on 06/16/2012 showed multiple hypoechoic masses throughout the upper quadrants of the right breast. The palpable mass in the 1:00 location corresponded with an irregular hypoechoic mass measuring 1.7 x 1.4 cm. There was also a discrete hypoechoic lobulated mass at the 11:00 position measuring 2.1 x 1.1 x 2.1 cm. The third lesion identified at the 11:00 location, 3 cm from the nipple, measured 1.0 x 0.8 x 0.9 cm.   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  Bilateral breast MRI 06/23/2012 showed an enhancing mass in the upper central right breast measuring 1.8 cm, together with clumped nodular enhancement extending for a total area of 11.4 cm. In the upper outer quadrant of this breast there were 3 discrete  irregular masses measuring up to 3 cm. This correlated well with the ultrasound findings previously. In the left breast there was a large area of clumped nodular enhancement measuring up to 8.9 cm. In the lower central portion there was a 1.7 cm circumscribed nodule consistent with a known fibroadenoma. There weas no axillary or internal mammary adenopathy noted on either side.   The 8.9 cm area of abnormality in the left breast was biopsied on 06/30/2012 showing fibrocystic changes with adenosis and calcifications, pseudoangiomatous stromal hyperplasia, but no evidence of malignancy.  The patient underwent bilateral mastectomies under the care of Dr. Luisa Barry on 07/14/2012. The left mastectomy revealed benign breast tissue and was negative for malignancy. 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 Lymphovascular invasion was identified; #2) a ductal carcinoma in situ, grade 3, with comedo necrosis.  Surgical margins were negative. 4 lymph nodes were examined, one positive for metastatic mammary carcinoma, a second positive for isolated tumor cells. pT2, pN1a  Subsequent history is detailed below.  INTERVAL HISTORY: Sherry Barry returns today  for followup of her right breast cancer. She is due for day 1 cycle 4 of 6 planned adjuvant cycles of docetaxel/carboplatin/trastuzumab. She receives Neulasta on day 2 for granulocyte support.  Sherry Barry is feeling well, and continues to tolerate chemotherapy very well. In fact her only concern today is the fact that when she is taking the dexamethasone, her blood pressure does seem to increase. When she is off the steroids, it  seems to come down, and she continues on lisinopril daily with good tolerance.   REVIEW OF SYSTEMS: Aki has had no fevers, chills, or night sweats. She denies any rashes or skin changes, other than some mild hyperpigmentation of the nailbeds.  There's been no drainage or evidence of infection. She's had no abnormal bruising or bleeding. She's had no oral ulcerations or sensitivity. She's eating and drinking well, and is keeping herself well hydrated. She's had no problems with nausea or emesis and denies any change in bowel or bladder habits. She denies any cough, shortness of breath, orthopnea, or chest pain. She does have some palpitations while taking the dexamethasone. She's had no abnormal headaches or dizziness. She denies any peripheral swelling. She continues to deny any signs of numbness or tingling in either the upper or lower extremities.  A detailed review of systems is otherwise stable and noncontributory.   PAST MEDICAL HISTORY: Past Medical History  Diagnosis Date  . Allergy   . Migraine   . Migraine   . History of migraine     last one about a week ago  . History of chemotherapy     doxetaxel/carboplatin/trastuzumab  . Breast cancer   . Breast cancer     right  . Hypertension     PAST SURGICAL HISTORY: Past Surgical History  Procedure Laterality Date  . Simple mastectomy with axillary sentinel node biopsy  07/14/2012    Procedure: SIMPLE MASTECTOMY WITH AXILLARY SENTINEL NODE BIOPSY;  Surgeon: Clovis Pu. Cornett, MD;  Location: [facility] OR;  Service: General;  Laterality: Right;  Bilateral simple mastectomy with port and right sebtibel lymph node mapping  . Simple mastectomy with axillary sentinel node biopsy  07/14/2012    Procedure: SIMPLE MASTECTOMY;  Surgeon: Clovis Pu. Cornett, MD;  Location: [facility] OR;  Service: General;  Laterality: Left;  . Portacath placement  07/14/2012    Procedure: INSERTION PORT-A-CATH;  Surgeon: Clovis Pu. Cornett, MD;  Location: [facility] OR;  Service: General;  Laterality: Left;  . Abdominal hysterectomy      no salpingo-oophorectomy    FAMILY HISTORY Family History  Problem Relation Age of Onset  . Hypertension Mother   . Alcohol abuse Mother   . Heart disease Maternal Grandmother   .  Stroke Maternal Grandfather   . Colon cancer Maternal Aunt 55    alive at 51  . Brain cancer Maternal Uncle 3    and lymphoma in early 58s; deceased  . Brain cancer Maternal Uncle 60    deceased  . Pancreatic cancer Maternal Uncle 45    alive at 49  . Breast cancer Maternal Aunt     great aunt through mat GF; dx at ? age  . Breast cancer Cousin 65    mat 1st cousin once removed through mat GF ; deceased   the patient's father died in an automobile accident in his early 59s. The patient's mother is living, currently age 41. The patient has one brother and one sister. There is no family history of breast or ovarian cancer, although there are maternal uncles with brain, colon and lymph node cancers.  GYNECOLOGIC HISTORY: Menarche age 67, first live birth age 47, she is GX P2. She stopped having periods with her surgery March of 2009. She does have "premenstrual symptoms" on a regular basis.  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 Her husband Sherry Barry works for a company in the [place] area doing testing. Their children  Sherry Barry (9) and Sherry Barry (4) at home.   ADVANCED DIRECTIVES: not in place  HEALTH MAINTENANCE: History  Substance Use Topics  . Smoking status: Never Smoker   . Smokeless tobacco: Never Used  . Alcohol Use: Yes     Comment: occasional     Colonoscopy: -  PAP: 09/2010  Bone density: -  Lipid panel:  Allergies  Allergen Reactions  . Codeine Itching  . Latex Swelling    Current Outpatient Prescriptions  Medication Sig Dispense Refill  . dexamethasone (DECADRON) 4 MG tablet Take 2 tablets (8 mg total) by mouth 2 (two) times daily with a meal. Take two times a day the day before Taxotere. Then take two times a day starting the day after chemo for 3 days.  30 tablet  1  . lidocaine-prilocaine (EMLA) cream Apply topically as needed. Apply to port 1-2 hours before chemo and cover with plastic wrap  30 g  0   . lisinopril (PRINIVIL) 5 MG tablet Take 1 tablet (5 mg total) by mouth daily.  30 tablet  1  . LORazepam (ATIVAN) 0.5 MG tablet Take 1 tablet (0.5 mg total) by mouth every 6 (six) hours as needed (Nausea or vomiting).  30 tablet  0  . mometasone (NASONEX) 50 MCG/ACT nasal spray Place 2 sprays into the nose daily.      . ondansetron (ZOFRAN) 8 MG tablet Take 1 tablet (8 mg total) by mouth 2 (two) times daily. Take two times a day starting the day after chemo for 3 days. Then take two times a day as needed for nausea or vomiting.  30 tablet  1  . prochlorperazine (COMPAZINE) 10 MG tablet Take 1 tablet (10 mg total) by mouth every 6 (six) hours as needed (Nausea or vomiting).  30 tablet  1  . valACYclovir (VALTREX) 500 MG tablet Take 1 tablet (500 mg total) by mouth 2 (two) times daily.  30 tablet  3  . prochlorperazine (COMPAZINE) 25 MG suppository Place 1 suppository (25 mg total) rectally every 12 (twelve) hours as needed for nausea.  12 suppository  3   No current facility-administered medications for this visit.    OBJECTIVE: Young-appearing African American woman in no acute distress Filed Vitals:   10/19/12 1038  BP: 147/99  Pulse: 121  Temp: 98.7 F (37.1 C)  Resp: 20     Body mass index is 26.66 kg/(m^2).    ECOG FS: 1 Filed Weights   10/19/12 1038  Weight: 145 lb 12.8 oz (66.134 kg)    Sclerae unicteric Oropharynx clear, with no ulcerations or evidence of candidiasis. No cervical or supraclavicular adenopathy Lungs clear to auscultation, no wheezes or rhonchi Heart regular rate and rhythm Abdomen soft, nontender, positive bowel sounds MSK no focal spinal tenderness No peripheral edema Neuro: nonfocal. Well oriented with positive affect. Breasts: Deferred. Both axillae are benign with no palpable adenopathy.    LAB RESULTS: Lab Results  Component Value Date   WBC 12.5* 10/19/2012   NEUTROABS 10.5* 10/19/2012   HGB 12.1 10/19/2012   HCT 35.8 10/19/2012   MCV 89.1 10/19/2012    PLT 189 10/19/2012      Chemistry      Component Value Date/Time   NA 140 09/28/2012 1121   NA 135 07/15/2012 0500   K 3.9 09/28/2012 1121   K 3.9 07/15/2012 0500   CL 105 09/28/2012 1121   CL 103 07/15/2012 0500   CO2 26 09/28/2012 1121   CO2 25 07/15/2012 0500  BUN 18.2 09/28/2012 1121   BUN 7 07/15/2012 0500   CREATININE 0.8 09/28/2012 1121   CREATININE 0.62 07/15/2012 0500   CREATININE 0.80 05/02/2012 1412      Component Value Date/Time   CALCIUM 9.7 09/28/2012 1121   CALCIUM 8.2* 07/15/2012 0500   ALKPHOS 65 09/28/2012 1121   ALKPHOS 46 07/14/2012 2300   AST 38* 09/28/2012 1121   AST 14 07/14/2012 2300   ALT 59* 09/28/2012 1121   ALT 7 07/14/2012 2300   BILITOT 0.24 09/28/2012 1121   BILITOT 0.3 07/14/2012 2300       Lab Results  Component Value Date   LABCA2 41* 06/28/2012     STUDIES:  Echocardiogram 08/01/2012 showed an ejection fraction in the 60-65% range.        ASSESSMENT: 42 y.o. [place] woman status post right mastectomy and sentinel lymph node sampling 07/14/2012 (with simple left mastectomy)  for a pT2, pN1, stage IIB invasive ductal carcinoma, grade 3, estrogen receptor 100% and progesterone receptor 100% positive, with an MIB-1 of 48%, and HER-2 amplified by CISH with a ratio of 3.02.  (1) to start adjuvant chemotherapy 08/17/2012, consisting of carboplatin, docetaxel and trastuzumab given every 3 weeks for 6 cycles  (2) trastuzumab to be continued in to March of 2015. Most recent echocardiogram 08/01/2012  (3)  Hypertension, better controlled on lisinopril since 09/28/2012  PLAN:  Claressa will proceed to treatment today as scheduled for her fourth cycle of adjuvant chemotherapy. We'll continue to follow her hypertension closely, but does appear that this increases only when she is taking her dexamethasone.of course she will continue on the list of April.  [place]akeley will have her Neulasta injection tomorrow, and I will see her next week on may 14 for assessment of  chemotoxicity. She is already scheduled for her repeat echocardiogram in mid May.  Patient voices understanding and agreement with our plan, and will call any changes or problems prior to her next appointment.   Jaelin Fackler    10/19/2012

## 2012-10-20 ENCOUNTER — Ambulatory Visit (HOSPITAL_BASED_OUTPATIENT_CLINIC_OR_DEPARTMENT_OTHER): Payer: BC Managed Care – PPO

## 2012-10-20 VITALS — BP 139/99 | HR 102 | Temp 98.8°F

## 2012-10-20 DIAGNOSIS — C50211 Malignant neoplasm of upper-inner quadrant of right female breast: Secondary | ICD-10-CM

## 2012-10-20 DIAGNOSIS — C50219 Malignant neoplasm of upper-inner quadrant of unspecified female breast: Secondary | ICD-10-CM

## 2012-10-20 DIAGNOSIS — Z5189 Encounter for other specified aftercare: Secondary | ICD-10-CM

## 2012-10-20 MED ORDER — PEGFILGRASTIM INJECTION 6 MG/0.6ML
6.0000 mg | Freq: Once | SUBCUTANEOUS | Status: AC
  Administered 2012-10-20: 6 mg via SUBCUTANEOUS
  Filled 2012-10-20: qty 0.6

## 2012-10-23 ENCOUNTER — Other Ambulatory Visit: Payer: Self-pay | Admitting: *Deleted

## 2012-10-23 DIAGNOSIS — C50219 Malignant neoplasm of upper-inner quadrant of unspecified female breast: Secondary | ICD-10-CM

## 2012-10-23 MED ORDER — LORAZEPAM 0.5 MG PO TABS: 0.5000 mg | ORAL_TABLET | Freq: Four times a day (QID) | ORAL | Status: DC | PRN

## 2012-10-25 ENCOUNTER — Ambulatory Visit (HOSPITAL_BASED_OUTPATIENT_CLINIC_OR_DEPARTMENT_OTHER): Payer: BC Managed Care – PPO | Admitting: Physician Assistant

## 2012-10-25 ENCOUNTER — Encounter: Payer: Self-pay | Admitting: Physician Assistant

## 2012-10-25 ENCOUNTER — Other Ambulatory Visit (HOSPITAL_BASED_OUTPATIENT_CLINIC_OR_DEPARTMENT_OTHER): Payer: BC Managed Care – PPO | Admitting: Lab

## 2012-10-25 VITALS — BP 118/84 | HR 114 | Temp 98.4°F | Resp 20 | Ht 62.0 in | Wt 139.5 lb

## 2012-10-25 DIAGNOSIS — C50911 Malignant neoplasm of unspecified site of right female breast: Secondary | ICD-10-CM

## 2012-10-25 DIAGNOSIS — Z9010 Acquired absence of unspecified breast and nipple: Secondary | ICD-10-CM

## 2012-10-25 DIAGNOSIS — C50212 Malignant neoplasm of upper-inner quadrant of left female breast: Secondary | ICD-10-CM

## 2012-10-25 DIAGNOSIS — C50219 Malignant neoplasm of upper-inner quadrant of unspecified female breast: Secondary | ICD-10-CM

## 2012-10-25 DIAGNOSIS — Z170 Estrogen receptor positive status [ER+]: Secondary | ICD-10-CM

## 2012-10-25 DIAGNOSIS — C50211 Malignant neoplasm of upper-inner quadrant of right female breast: Secondary | ICD-10-CM

## 2012-10-25 LAB — CBC WITH DIFFERENTIAL/PLATELET
Basophils Absolute: 0 10*3/uL (ref 0.0–0.1)
Eosinophils Absolute: 0.1 10*3/uL (ref 0.0–0.5)
HCT: 39.2 % (ref 34.8–46.6)
LYMPH%: 26.2 % (ref 14.0–49.7)
MONO#: 0.8 10*3/uL (ref 0.1–0.9)
NEUT#: 4 10*3/uL (ref 1.5–6.5)
NEUT%: 60.6 % (ref 38.4–76.8)
Platelets: 150 10*3/uL (ref 145–400)
WBC: 6.5 10*3/uL (ref 3.9–10.3)

## 2012-10-25 MED ORDER — DEXAMETHASONE 4 MG PO TABS: ORAL_TABLET | ORAL | Status: DC

## 2012-10-25 NOTE — Progress Notes (Signed)
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 At that time a digital bilateal diagnostic mammogram showed a slight decrease in size of the benign fibroadenoma in the left breast compared to 2010. The same mammogram, however, also refilled pleomorphic calcifications extending over 9 cm in the inner third of the right breast. There were no suspicious calcifications in the left breast. On exam, Dr. Lyman Bishop was able to palpate a small freely mobile mass in the lower inner left breast, but was unable to palpate any abnormalities in the right breast other than some focal thickening.  An subsequent ultrasound of the right breast on 06/16/2012 showed multiple hypoechoic masses throughout the upper quadrants of the right breast. The palpable mass in the 1:00 location corresponded with an irregular hypoechoic mass measuring 1.7 x 1.4 cm. There was also a discrete hypoechoic lobulated mass at the 11:00 position measuring 2.1 x 1.1 x 2.1 cm. The third lesion identified at the 11:00 location, 3 cm from the nipple, measured 1.0 x 0.8 x 0.9 cm.   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  Bilateral breast MRI 06/23/2012 showed an enhancing mass in the upper central right breast measuring 1.8 cm, together with clumped nodular enhancement extending for a total area of 11.4 cm. In the upper outer quadrant of this breast there were 3 discrete  irregular masses measuring up to 3 cm. This correlated well with the ultrasound findings previously. In the left breast there was a large area of clumped nodular enhancement measuring up to 8.9 cm. In the lower central portion there was a 1.7 cm circumscribed nodule consistent with a known fibroadenoma. There weas no axillary or internal mammary adenopathy noted on either side.   The 8.9 cm area of abnormality in the left breast was biopsied on 06/30/2012 showing fibrocystic changes with adenosis and calcifications, pseudoangiomatous stromal hyperplasia, but no evidence of malignancy.  The patient underwent bilateral mastectomies under the care of Dr. Luisa Hart on 07/14/2012. The left mastectomy revealed benign breast tissue and was negative for malignancy. 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 Lymphovascular invasion was identified; #2) a ductal carcinoma in situ, grade 3, with comedo necrosis.  Surgical margins were negative. 4 lymph nodes were examined, one positive for metastatic mammary carcinoma, a second positive for isolated tumor cells. pT2, pN1a  Subsequent history is detailed below.  INTERVAL HISTORY: Sherry Barry returns today  for followup of her right breast cancer. She is currently day 7 cycle 4 of 6 planned adjuvant cycles of docetaxel/carboplatin/trastuzumab. She receives Neulasta on day 2 for granulocyte support.  Sherry Barry is feeling well, her biggest complaint being decreased appetite and taste alteration. She is still eating well and is drinking plenty of fluids, and denies any nausea, emesis, or change in bowel or bladder habits. She's had no signs of peripheral  neuropathy. Her energy level is good. Once again, her blood pressure has improved significantly since discontinuing the dexamethasone.   REVIEW OF SYSTEMS: Sherry Barry has had no fevers, chills, or night sweats. She denies any  rashes or skin changes, other than some mild hyperpigmentation of the nailbeds. There's been no drainage or evidence of infection. She's had no abnormal bruising or bleeding. She's had no oral ulcerations or sensitivity.  She denies any cough, shortness of breath, orthopnea, or chest pain.  She's had no abnormal headaches or dizziness. She denies any peripheral swelling.   A detailed review of systems is otherwise stable and noncontributory.   PAST MEDICAL HISTORY: Past Medical History  Diagnosis Date  . Allergy   . Migraine   . Migraine   . History of migraine     last one about a week ago  . History of chemotherapy     doxetaxel/carboplatin/trastuzumab  . Breast cancer   . Breast cancer     right  . Hypertension     PAST SURGICAL HISTORY: Past Surgical History  Procedure Laterality Date  . Simple mastectomy with axillary sentinel node biopsy  07/14/2012    Procedure: SIMPLE MASTECTOMY WITH AXILLARY SENTINEL NODE BIOPSY;  Surgeon: Clovis Pu. Cornett, MD;  Location: [facility] OR;  Service: General;  Laterality: Right;  Bilateral simple mastectomy with port and right sebtibel lymph node mapping  . Simple mastectomy with axillary sentinel node biopsy  07/14/2012    Procedure: SIMPLE MASTECTOMY;  Surgeon: Clovis Pu. Cornett, MD;  Location: [facility] OR;  Service: General;  Laterality: Left;  . Portacath placement  07/14/2012    Procedure: INSERTION PORT-A-CATH;  Surgeon: Clovis Pu. Cornett, MD;  Location: [facility] OR;  Service: General;  Laterality: Left;  . Abdominal hysterectomy      no salpingo-oophorectomy    FAMILY HISTORY Family History  Problem Relation Age of Onset  . Hypertension Mother   . Alcohol abuse Mother   . Heart disease Maternal Grandmother   . Stroke Maternal Grandfather   . Colon cancer Maternal Aunt 55    alive at 50  . Brain cancer Maternal Uncle 69    and lymphoma in early 27s; deceased  . Brain cancer Maternal Uncle 60    deceased  . Pancreatic cancer Maternal Uncle 45     alive at 71  . Breast cancer Maternal Aunt     great aunt through mat GF; dx at ? age  . Breast cancer Cousin 65    mat 1st cousin once removed through mat GF ; deceased   the patient's father died in an automobile accident in his early 10s. The patient's mother is living, currently age 81. The patient has one brother and one sister. There is no family history of breast or ovarian cancer, although there are maternal uncles with brain, colon and lymph node cancers.  GYNECOLOGIC HISTORY: Menarche age 59, first live birth age 24, she is GX P2. She stopped having periods with her surgery March of 2009. She does have "premenstrual symptoms" on a regular basis.  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 Her husband Sherry Barry works for a company in the [place] area doing testing. Their children Sherry Barry (9) and Sherry Barry (4) at home.   ADVANCED DIRECTIVES: not in place  HEALTH MAINTENANCE: History  Substance Use Topics  . Smoking status: Never Smoker   . Smokeless tobacco: Never Used  . Alcohol Use: Yes     Comment: occasional  Colonoscopy: Never  PAP: 09/2010  Bone density: Never  Lipid panel:  Allergies  Allergen Reactions  . Codeine Itching  . Latex Swelling    Current Outpatient Prescriptions  Medication Sig Dispense Refill  . dexamethasone (DECADRON) 4 MG tablet 2 tabs by mouth twice daily on day before and 3 days after each chemo cycle  30 tablet  1  . lidocaine-prilocaine (EMLA) cream Apply topically as needed. Apply to port 1-2 hours before chemo and cover with plastic wrap  30 g  0  . lisinopril (PRINIVIL) 5 MG tablet Take 1 tablet (5 mg total) by mouth daily.  30 tablet  1  . LORazepam (ATIVAN) 0.5 MG tablet Take 1 tablet (0.5 mg total) by mouth every 6 (six) hours as needed (Nausea or vomiting).  30 tablet  0  . mometasone (NASONEX) 50 MCG/ACT nasal spray Place 2 sprays into the nose daily.      . ondansetron  (ZOFRAN) 8 MG tablet Take 1 tablet (8 mg total) by mouth 2 (two) times daily. Take two times a day starting the day after chemo for 3 days. Then take two times a day as needed for nausea or vomiting.  30 tablet  1  . prochlorperazine (COMPAZINE) 10 MG tablet Take 1 tablet (10 mg total) by mouth every 6 (six) hours as needed (Nausea or vomiting).  30 tablet  1  . prochlorperazine (COMPAZINE) 25 MG suppository Place 1 suppository (25 mg total) rectally every 12 (twelve) hours as needed for nausea.  12 suppository  3  . valACYclovir (VALTREX) 500 MG tablet Take 1 tablet (500 mg total) by mouth 2 (two) times daily.  30 tablet  3   No current facility-administered medications for this visit.    OBJECTIVE: Young-appearing African American woman in no acute distress Filed Vitals:   10/25/12 1018  BP: 118/84  Pulse: 114  Temp: 98.4 F (36.9 C)  Resp: 20     Body mass index is 25.51 kg/(m^2).    ECOG FS: 1 Filed Weights   10/25/12 1018  Weight: 139 lb 8 oz (63.277 kg)    Sclerae unicteric Oropharynx clear, with no ulcerations or evidence of candidiasis. No cervical or supraclavicular adenopathy Lungs clear to auscultation, no wheezes or rhonchi Heart regular rate and rhythm Abdomen soft, nontender to palpation, positive bowel sounds MSK no focal spinal tenderness No peripheral edema Neuro: nonfocal. Well oriented with positive affect. Breasts: Deferred. Both axillae are benign with no palpable adenopathy. Port is intact in the left upper chest wall with no erythema, edema, or evidence of infection.    LAB RESULTS: Lab Results  Component Value Date   WBC 6.5 10/25/2012   NEUTROABS 4.0 10/25/2012   HGB 13.2 10/25/2012   HCT 39.2 10/25/2012   MCV 89.9 10/25/2012   PLT 150 10/25/2012      Chemistry      Component Value Date/Time   NA 140 10/19/2012 0955   NA 135 07/15/2012 0500   K 3.8 10/19/2012 0955   K 3.9 07/15/2012 0500   CL 106 10/19/2012 0955   CL 103 07/15/2012 0500   CO2 26 10/19/2012  0955   CO2 25 07/15/2012 0500   BUN 17.8 10/19/2012 0955   BUN 7 07/15/2012 0500   CREATININE 0.7 10/19/2012 0955   CREATININE 0.62 07/15/2012 0500   CREATININE 0.80 05/02/2012 1412      Component Value Date/Time   CALCIUM 9.4 10/19/2012 0955   CALCIUM 8.2* 07/15/2012 0500   ALKPHOS  59 10/19/2012 0955   ALKPHOS 46 07/14/2012 2300   AST 29 10/19/2012 0955   AST 14 07/14/2012 2300   ALT 41 10/19/2012 0955   ALT 7 07/14/2012 2300   BILITOT 0.29 10/19/2012 0955   BILITOT 0.3 07/14/2012 2300       Lab Results  Component Value Date   LABCA2 41* 06/28/2012     STUDIES:  Echocardiogram 08/01/2012 showed an ejection fraction in the 60-65% range.       ASSESSMENT: 42 y.o. [place] woman status post right mastectomy and sentinel lymph node sampling 07/14/2012 (with simple left mastectomy)  for a pT2, pN1, stage IIB invasive ductal carcinoma, grade 3, estrogen receptor 100% and progesterone receptor 100% positive, with an MIB-1 of 48%, and HER-2 amplified by CISH with a ratio of 3.02.  (1) adjuvant chemotherapy started 08/17/2012, consisting of carboplatin, docetaxel and trastuzumab to be given every 3 weeks for 6 cycles  (2) trastuzumab to be continued to March of 2015. Most recent echocardiogram 08/01/2012  (3)  Hypertension, better controlled on lisinopril since 09/28/2012  PLAN:  Sherry Barry continues to tolerate treatment well. She scheduled for an echocardiogram next week on may 19, and I will see her in 2 weeks for labs and followup prior to day 1 cycle 5 of chemotherapy.  Patient voices understanding and agreement with our plan, and will call any changes or problems prior to her next appointment.   Sherry Barry    10/25/2012

## 2012-10-30 ENCOUNTER — Ambulatory Visit (HOSPITAL_COMMUNITY)
Admission: RE | Admit: 2012-10-30 | Discharge: 2012-10-30 | Disposition: A | Discharge status: Home / Self Care | Payer: BC Managed Care – PPO | Source: Ambulatory Visit | Attending: Family Medicine | Admitting: Family Medicine

## 2012-10-30 ENCOUNTER — Ambulatory Visit (HOSPITAL_BASED_OUTPATIENT_CLINIC_OR_DEPARTMENT_OTHER)
Admission: RE | Admit: 2012-10-30 | Discharge: 2012-10-30 | Disposition: A | Discharge status: Home / Self Care | Payer: BC Managed Care – PPO | Source: Ambulatory Visit | Attending: Internal Medicine | Admitting: Internal Medicine

## 2012-10-30 ENCOUNTER — Encounter (HOSPITAL_COMMUNITY): Payer: Self-pay

## 2012-10-30 VITALS — BP 102/76 | HR 88 | Wt 146.8 lb

## 2012-10-30 DIAGNOSIS — I079 Rheumatic tricuspid valve disease, unspecified: Secondary | ICD-10-CM | POA: Insufficient documentation

## 2012-10-30 DIAGNOSIS — C50211 Malignant neoplasm of upper-inner quadrant of right female breast: Secondary | ICD-10-CM

## 2012-10-30 DIAGNOSIS — I10 Essential (primary) hypertension: Secondary | ICD-10-CM | POA: Insufficient documentation

## 2012-10-30 DIAGNOSIS — Z09 Encounter for follow-up examination after completed treatment for conditions other than malignant neoplasm: Secondary | ICD-10-CM

## 2012-10-30 DIAGNOSIS — C50219 Malignant neoplasm of upper-inner quadrant of unspecified female breast: Secondary | ICD-10-CM

## 2012-10-30 DIAGNOSIS — C50919 Malignant neoplasm of unspecified site of unspecified female breast: Secondary | ICD-10-CM | POA: Insufficient documentation

## 2012-10-30 NOTE — Assessment & Plan Note (Signed)
Patient seen and examined with Ulyess Blossom, PA-C. We discussed all aspects of the encounter. I agree with the assessment as stated above. My thoughts below.  I reviewed echos personally. EF and Doppler parameters stable. No HF on exam. Continue Herceptin. On one of two of the images LV had variegated appearance which I felt was likely due to overgained images. Reviewed with Dr. Shirlee Latch who agreed.

## 2012-10-30 NOTE — Progress Notes (Signed)
  Echocardiogram 2D Echocardiogram has been performed.  Sherry Barry, [facility] 10/30/2012, 11:58 AM

## 2012-10-30 NOTE — Progress Notes (Signed)
Referring Physician: Primary Care: Primary Cardiologist:  HPI: Sherry Barry is a 42 y.o. 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  She is s/p bilateral mastectomy 07/14/12.  She is followed by Dr. Darnelle Catalan was started on adjuvant chemotherapy on 08/17/12 consisting of carboplatin, docetaxel and trastuzumab given every 3 weeks for 6 cycles.  She will then continue trastuzumab for 1 year.    Echo: 08/01/12: EF 60-65% Lat s' 11.4 10/30/12: EF 60-65% lat s' 11.7  She returns for follow up today.  She feels well today.  Herceptin therapy is going good, continues q3 weeks.  She denies orthopnea, PNd or edema.  Her BP was up and has been started on lisinopril 5 mg daily.  No dizziness or syncope.      Lives at home with husband and 2 boys.     Review of Systems:   Past Medical History  Diagnosis Date  . Allergy   . Migraine   . Migraine   . History of migraine     last one about a week ago  . History of chemotherapy     doxetaxel/carboplatin/trastuzumab  . Breast cancer   . Breast cancer     right  . Hypertension     Current Outpatient Prescriptions  Medication Sig Dispense Refill  . dexamethasone (DECADRON) 4 MG tablet 2 tabs by mouth twice daily on day before and 3 days after each chemo cycle  30 tablet  1  . lidocaine-prilocaine (EMLA) cream Apply topically as needed. Apply to port 1-2 hours before chemo and cover with plastic wrap  30 g  0  . lisinopril (PRINIVIL) 5 MG tablet Take 1 tablet (5 mg total) by mouth daily.  30 tablet  1  . LORazepam (ATIVAN) 0.5 MG tablet Take 1 tablet (0.5 mg total) by mouth every 6 (six) hours as needed (Nausea or vomiting).  30 tablet  0  . mometasone (NASONEX) 50 MCG/ACT nasal spray Place 2 sprays into the nose daily.      . ondansetron (ZOFRAN) 8 MG tablet Take 1 tablet (8 mg total) by mouth 2 (two) times daily. Take two times a day starting the day after chemo for 3 days. Then take two times a day  as needed for nausea or vomiting.  30 tablet  1  . prochlorperazine (COMPAZINE) 10 MG tablet Take 1 tablet (10 mg total) by mouth every 6 (six) hours as needed (Nausea or vomiting).  30 tablet  1  . valACYclovir (VALTREX) 500 MG tablet Take 1 tablet (500 mg total) by mouth 2 (two) times daily.  30 tablet  3   No current facility-administered medications for this encounter.   Allergies  Allergen Reactions  . Codeine Itching  . Latex Swelling    PHYSICAL EXAM: Filed Vitals:   10/30/12 1212  BP: 102/76  Pulse: 88  Weight: 146 lb 12.8 oz (66.588 kg)  SpO2: 100%     General:  Well appearing. No respiratory difficulty HEENT: normal Neck: supple. no JVD. Carotids 2+ bilat; no bruits. No lymphadenopathy or thryomegaly appreciated. Cor: PMI nondisplaced. Regular rate & rhythm. No rubs, gallops or murmurs. Lungs: clear Abdomen: soft, nontender, nondistended. No hepatosplenomegaly. No bruits or masses. Good bowel sounds. Extremities: no cyanosis, clubbing, rash, edema Neuro: alert & oriented x 3, cranial nerves grossly intact. moves all 4 extremities w/o difficulty. Affect pleasant.     ASSESSMENT & PLAN:

## 2012-11-09 ENCOUNTER — Telehealth: Payer: Self-pay | Admitting: *Deleted

## 2012-11-09 ENCOUNTER — Ambulatory Visit (HOSPITAL_BASED_OUTPATIENT_CLINIC_OR_DEPARTMENT_OTHER): Payer: BC Managed Care – PPO

## 2012-11-09 ENCOUNTER — Other Ambulatory Visit (HOSPITAL_BASED_OUTPATIENT_CLINIC_OR_DEPARTMENT_OTHER): Payer: BC Managed Care – PPO | Admitting: Lab

## 2012-11-09 ENCOUNTER — Telehealth: Payer: Self-pay | Admitting: Oncology

## 2012-11-09 ENCOUNTER — Ambulatory Visit (HOSPITAL_BASED_OUTPATIENT_CLINIC_OR_DEPARTMENT_OTHER): Payer: BC Managed Care – PPO | Admitting: Physician Assistant

## 2012-11-09 ENCOUNTER — Encounter: Payer: Self-pay | Admitting: Physician Assistant

## 2012-11-09 VITALS — BP 127/86 | HR 101 | Temp 98.6°F | Resp 20 | Ht 62.0 in | Wt 149.2 lb

## 2012-11-09 DIAGNOSIS — Z170 Estrogen receptor positive status [ER+]: Secondary | ICD-10-CM

## 2012-11-09 DIAGNOSIS — C50211 Malignant neoplasm of upper-inner quadrant of right female breast: Secondary | ICD-10-CM

## 2012-11-09 DIAGNOSIS — Z5112 Encounter for antineoplastic immunotherapy: Secondary | ICD-10-CM

## 2012-11-09 DIAGNOSIS — Z5111 Encounter for antineoplastic chemotherapy: Secondary | ICD-10-CM

## 2012-11-09 DIAGNOSIS — C50911 Malignant neoplasm of unspecified site of right female breast: Secondary | ICD-10-CM

## 2012-11-09 DIAGNOSIS — C50219 Malignant neoplasm of upper-inner quadrant of unspecified female breast: Secondary | ICD-10-CM

## 2012-11-09 DIAGNOSIS — I10 Essential (primary) hypertension: Secondary | ICD-10-CM

## 2012-11-09 LAB — CBC WITH DIFFERENTIAL/PLATELET
Eosinophils Absolute: 0 10*3/uL (ref 0.0–0.5)
LYMPH%: 10.9 % — ABNORMAL LOW (ref 14.0–49.7)
MONO#: 0.7 10*3/uL (ref 0.1–0.9)
NEUT#: 8.1 10*3/uL — ABNORMAL HIGH (ref 1.5–6.5)
Platelets: 183 10*3/uL (ref 145–400)
RBC: 3.85 10*6/uL (ref 3.70–5.45)
RDW: 17.3 % — ABNORMAL HIGH (ref 11.2–14.5)
WBC: 10 10*3/uL (ref 3.9–10.3)
lymph#: 1.1 10*3/uL (ref 0.9–3.3)
nRBC: 0 % (ref 0–0)

## 2012-11-09 LAB — COMPREHENSIVE METABOLIC PANEL (CC13)
ALT: 77 U/L — ABNORMAL HIGH (ref 0–55)
Albumin: 3.4 g/dL — ABNORMAL LOW (ref 3.5–5.0)
CO2: 25 mEq/L (ref 22–29)
Calcium: 9.3 mg/dL (ref 8.4–10.4)
Chloride: 106 mEq/L (ref 98–107)
Potassium: 3.8 mEq/L (ref 3.5–5.1)
Sodium: 142 mEq/L (ref 136–145)
Total Protein: 6.5 g/dL (ref 6.4–8.3)

## 2012-11-09 MED ORDER — DIPHENHYDRAMINE HCL 25 MG PO CAPS
50.0000 mg | ORAL_CAPSULE | Freq: Once | ORAL | Status: AC
  Administered 2012-11-09: 50 mg via ORAL

## 2012-11-09 MED ORDER — TRASTUZUMAB CHEMO INJECTION 440 MG
378.0000 mg | Freq: Once | INTRAVENOUS | Status: AC
  Administered 2012-11-09: 378 mg via INTRAVENOUS
  Filled 2012-11-09: qty 18

## 2012-11-09 MED ORDER — DEXAMETHASONE SODIUM PHOSPHATE 20 MG/5ML IJ SOLN
20.0000 mg | Freq: Once | INTRAMUSCULAR | Status: AC
  Administered 2012-11-09: 20 mg via INTRAVENOUS

## 2012-11-09 MED ORDER — ACETAMINOPHEN 325 MG PO TABS
650.0000 mg | ORAL_TABLET | Freq: Once | ORAL | Status: AC
  Administered 2012-11-09: 650 mg via ORAL

## 2012-11-09 MED ORDER — ONDANSETRON 16 MG/50ML IVPB (CHCC)
16.0000 mg | Freq: Once | INTRAVENOUS | Status: AC
  Administered 2012-11-09: 16 mg via INTRAVENOUS

## 2012-11-09 MED ORDER — SODIUM CHLORIDE 0.9 % IV SOLN
Freq: Once | INTRAVENOUS | Status: AC
  Administered 2012-11-09: 11:00:00 via INTRAVENOUS

## 2012-11-09 MED ORDER — SODIUM CHLORIDE 0.9 % IJ SOLN
10.0000 mL | INTRAMUSCULAR | Status: DC | PRN
Start: 2012-11-09 — End: 2012-11-09
  Administered 2012-11-09: 10 mL
  Filled 2012-11-09: qty 10

## 2012-11-09 MED ORDER — SODIUM CHLORIDE 0.9 % IV SOLN
750.0000 mg | Freq: Once | INTRAVENOUS | Status: AC
  Administered 2012-11-09: 750 mg via INTRAVENOUS
  Filled 2012-11-09: qty 75

## 2012-11-09 MED ORDER — SODIUM CHLORIDE 0.9 % IV SOLN
75.0000 mg/m2 | Freq: Once | INTRAVENOUS | Status: AC
  Administered 2012-11-09: 120 mg via INTRAVENOUS
  Filled 2012-11-09: qty 12

## 2012-11-09 MED ORDER — HEPARIN SOD (PORK) LOCK FLUSH 100 UNIT/ML IV SOLN
500.0000 [IU] | Freq: Once | INTRAVENOUS | Status: AC | PRN
  Administered 2012-11-09: 500 [IU]
  Filled 2012-11-09: qty 5

## 2012-11-09 NOTE — Telephone Encounter (Signed)
Per staff phone call and POF I have schedueld appts.  JMW  

## 2012-11-09 NOTE — Progress Notes (Signed)
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 At that time a digital bilateal diagnostic mammogram showed a slight decrease in size of the benign fibroadenoma in the left breast compared to 2010. The same mammogram, however, also refilled pleomorphic calcifications extending over 9 cm in the inner third of the right breast. There were no suspicious calcifications in the left breast. On exam, Dr. Lyman Bishop was able to palpate a small freely mobile mass in the lower inner left breast, but was unable to palpate any abnormalities in the right breast other than some focal thickening.  An subsequent ultrasound of the right breast on 06/16/2012 showed multiple hypoechoic masses throughout the upper quadrants of the right breast. The palpable mass in the 1:00 location corresponded with an irregular hypoechoic mass measuring 1.7 x 1.4 cm. There was also a discrete hypoechoic lobulated mass at the 11:00 position measuring 2.1 x 1.1 x 2.1 cm. The third lesion identified at the 11:00 location, 3 cm from the nipple, measured 1.0 x 0.8 x 0.9 cm.   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  Bilateral breast MRI 06/23/2012 showed an enhancing mass in the upper central right breast measuring 1.8 cm, together with clumped nodular enhancement extending for a total area of 11.4 cm. In the upper outer quadrant of this breast there were 3 discrete  irregular masses measuring up to 3 cm. This correlated well with the ultrasound findings previously. In the left breast there was a large area of clumped nodular enhancement measuring up to 8.9 cm. In the lower central portion there was a 1.7 cm circumscribed nodule consistent with a known fibroadenoma. There weas no axillary or internal mammary adenopathy noted on either side.   The 8.9 cm area of abnormality in the left breast was biopsied on 06/30/2012 showing fibrocystic changes with adenosis and calcifications, pseudoangiomatous stromal hyperplasia, but no evidence of malignancy.  The patient underwent bilateral mastectomies under the care of Dr. Luisa Hart on 07/14/2012. The left mastectomy revealed benign breast tissue and was negative for malignancy. 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 Lymphovascular invasion was identified; #2) a ductal carcinoma in situ, grade 3, with comedo necrosis.  Surgical margins were negative. 4 lymph nodes were examined, one positive for metastatic mammary carcinoma, a second positive for isolated tumor cells. pT2, pN1a  Subsequent history is detailed below.  INTERVAL HISTORY: Sherry Barry returns today  for followup of her right breast cancer. She is due for day 1 cycle 5 of 6 planned adjuvant cycles of docetaxel/carboplatin/trastuzumab. She receives Neulasta on day 2 for granulocyte support.  Sherry Barry is ready to proceed with treatment today. She is feeling well with few complaints. Her energy level is good. Her appetite has improved. She's had no problems at all with numbness 13 going in either the upper or lower extremities.  She continues to keep a close eye on her blood pressure. We'll central does seem to be controlling it very well she tells me, except for the week she is taking the dexamethasone.   REVIEW OF SYSTEMS: Sherry Barry has had no fevers, chills, or night  sweats. She denies any rashes or skin changes, other than some mild hyperpigmentation of the nailbeds.  She's had no nausea, or change in bowel movements. She has a history of some hemorrhoids, and has had some slight rectal bleeding, only notable on the tissue after a bowel movement. She's had no additional abnormal bruising or bleeding. She's had no oral ulcerations or sensitivity.  She denies any cough, shortness of breath, orthopnea, or chest pain.  She had a brief headache over the weekend, but this resolved quickly, and she's had no dizziness or change in vision. She denies any myalgias, arthralgias, or bony pain and has had no peripheral swelling.   A detailed review of systems is otherwise stable and noncontributory.   PAST MEDICAL HISTORY: Past Medical History  Diagnosis Date  . Allergy   . Migraine   . Migraine   . History of migraine     last one about a week ago  . History of chemotherapy     doxetaxel/carboplatin/trastuzumab  . Breast cancer   . Breast cancer     right  . Hypertension     PAST SURGICAL HISTORY: Past Surgical History  Procedure Laterality Date  . Simple mastectomy with axillary sentinel node biopsy  07/14/2012    Procedure: SIMPLE MASTECTOMY WITH AXILLARY SENTINEL NODE BIOPSY;  Surgeon: Clovis Pu. Cornett, MD;  Location: [facility] OR;  Service: General;  Laterality: Right;  Bilateral simple mastectomy with port and right sebtibel lymph node mapping  . Simple mastectomy with axillary sentinel node biopsy  07/14/2012    Procedure: SIMPLE MASTECTOMY;  Surgeon: Clovis Pu. Cornett, MD;  Location: [facility] OR;  Service: General;  Laterality: Left;  . Portacath placement  07/14/2012    Procedure: INSERTION PORT-A-CATH;  Surgeon: Clovis Pu. Cornett, MD;  Location: [facility] OR;  Service: General;  Laterality: Left;  . Abdominal hysterectomy      no salpingo-oophorectomy    FAMILY HISTORY Family History  Problem Relation Age of Onset  . Hypertension Mother   . Alcohol abuse Mother    . Heart disease Maternal Grandmother   . Stroke Maternal Grandfather   . Colon cancer Maternal Aunt 55    alive at 68  . Brain cancer Maternal Uncle 57    and lymphoma in early 60s; deceased  . Brain cancer Maternal Uncle 60    deceased  . Pancreatic cancer Maternal Uncle 45    alive at 61  . Breast cancer Maternal Aunt     great aunt through mat GF; dx at ? age  . Breast cancer Cousin 65    mat 1st cousin once removed through mat GF ; deceased   the patient's father died in an automobile accident in his early 37s. The patient's mother is living, currently age 95. The patient has one brother and one sister. There is no family history of breast or ovarian cancer, although there are maternal uncles with brain, colon and lymph node cancers.  GYNECOLOGIC HISTORY: Menarche age 31, first live birth age 24, she is GX P2. She stopped having periods with her surgery March of 2009. She does have "premenstrual symptoms" on a regular basis.  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 Her husband Brand Males works for a company in the [place] doing testing. Their children Jaden (9) and Bryson (4) at home.   ADVANCED DIRECTIVES: not in place  HEALTH MAINTENANCE: History  Substance Use Topics  . Smoking status: Never Smoker   . Smokeless tobacco: Never Used  . Alcohol Use: Yes     Comment: occasional     Colonoscopy: Never  PAP: 09/2010  Bone density: Never  Lipid panel:  Allergies  Allergen Reactions  . Codeine Itching  . Latex Swelling    Current Outpatient Prescriptions  Medication Sig Dispense Refill  . dexamethasone (DECADRON) 4 MG tablet 2 tabs by mouth twice daily on day before and 3 days after each chemo cycle  30 tablet  1  . lidocaine-prilocaine (EMLA) cream Apply topically as needed. Apply to port 1-2 hours before chemo and cover with plastic wrap  30 g  0  . lisinopril (PRINIVIL) 5 MG tablet Take 1 tablet (5  mg total) by mouth daily.  30 tablet  1  . LORazepam (ATIVAN) 0.5 MG tablet Take 1 tablet (0.5 mg total) by mouth every 6 (six) hours as needed (Nausea or vomiting).  30 tablet  0  . mometasone (NASONEX) 50 MCG/ACT nasal spray Place 2 sprays into the nose daily.      . ondansetron (ZOFRAN) 8 MG tablet Take 1 tablet (8 mg total) by mouth 2 (two) times daily. Take two times a day starting the day after chemo for 3 days. Then take two times a day as needed for nausea or vomiting.  30 tablet  1  . prochlorperazine (COMPAZINE) 10 MG tablet Take 1 tablet (10 mg total) by mouth every 6 (six) hours as needed (Nausea or vomiting).  30 tablet  1  . valACYclovir (VALTREX) 500 MG tablet Take 1 tablet (500 mg total) by mouth 2 (two) times daily.  30 tablet  3   No current facility-administered medications for this visit.    OBJECTIVE: Young-appearing African American woman in no acute distress Filed Vitals:   11/09/12 0945  BP: 127/86  Pulse: 101  Temp: 98.6 F (37 C)  Resp: 20     Body mass index is 27.28 kg/(m^2).    ECOG FS: 1 Filed Weights   11/09/12 0945  Weight: 149 lb 3.2 oz (67.677 kg)    Sclerae unicteric Oropharynx clear, with no ulcerations or evidence of candidiasis. No cervical or supraclavicular adenopathy Lungs clear to auscultation, no wheezes or rhonchi Heart regular rate and rhythm Abdomen soft, nontender to palpation, positive bowel sounds MSK no focal spinal tenderness to palpation No peripheral edema Neuro: nonfocal. Well oriented with positive affect. Breasts: Deferred. Both axillae are benign with no palpable adenopathy. Port is intact in the left upper chest wall with no erythema, edema, or evidence of infection.    LAB RESULTS: Lab Results  Component Value Date   WBC 10.0 11/09/2012   NEUTROABS 8.1* 11/09/2012   HGB 11.7 11/09/2012   HCT 35.3 11/09/2012   MCV 91.7 11/09/2012   PLT 183 11/09/2012      Chemistry      Component Value Date/Time   NA 140 10/19/2012  0955   NA 135 07/15/2012 0500   K 3.8 10/19/2012 0955   K 3.9 07/15/2012 0500   CL 106 10/19/2012 0955   CL 103 07/15/2012 0500   CO2 26 10/19/2012 0955   CO2 25 07/15/2012 0500   BUN 17.8 10/19/2012 0955   BUN 7  07/15/2012 0500   CREATININE 0.7 10/19/2012 0955   CREATININE 0.62 07/15/2012 0500   CREATININE 0.80 05/02/2012 1412      Component Value Date/Time   CALCIUM 9.4 10/19/2012 0955   CALCIUM 8.2* 07/15/2012 0500   ALKPHOS 59 10/19/2012 0955   ALKPHOS 46 07/14/2012 2300   AST 29 10/19/2012 0955   AST 14 07/14/2012 2300   ALT 41 10/19/2012 0955   ALT 7 07/14/2012 2300   BILITOT 0.29 10/19/2012 0955   BILITOT 0.3 07/14/2012 2300       Lab Results  Component Value Date   LABCA2 41* 06/28/2012     STUDIES:  Echocardiogram 10/30/2012 showed an ejection fraction of 60 %.      ASSESSMENT: 42 y.o. Sherry Barry woman status post right mastectomy and sentinel lymph node sampling 07/14/2012 (with simple left mastectomy)  for a pT2, pN1, stage IIB invasive ductal carcinoma, grade 3, estrogen receptor 100% and progesterone receptor 100% positive, with an MIB-1 of 48%, and HER-2 amplified by CISH with a ratio of 3.02.  (1) adjuvant chemotherapy started 08/17/2012, consisting of carboplatin, docetaxel and trastuzumab to be given every 3 weeks for 6 cycles  (2) trastuzumab to be continued to March of 2015. Most recent echocardiogram 10/30/2012  (3)  Hypertension, better controlled on lisinopril since 09/28/2012  PLAN:  Sherry Barry will proceed to treatment today as scheduled for her fifth dose of docetaxel/carboplatin/trastuzumab. She'll receive Neulasta tomorrow, and he'll see her next week for assessment of chemotoxicity on June 4. She is already scheduled for her sixth and final dose of adjuvant chemotherapy on June 19. I'm going to go ahead and schedule her for her next several trastuzumab doses, which will continue on a Q. three-week basis. Her next echocardiogram will be due in mid August.  Sherry Barry voices understanding  and agreement with this plan, called any changes or problems.    Sherry Barry    11/09/2012

## 2012-11-09 NOTE — Patient Instructions (Addendum)
[facility] Discharge Instructions for Patients Receiving Chemotherapy  Today you received the following chemotherapy agents Taxotere/Carboplatin/Herceptin.  To help prevent nausea and vomiting after your treatment, we encourage you to take your nausea medication as directed.   If you develop nausea and vomiting that is not controlled by your nausea medication, call the clinic. If it is after clinic hours your family physician or the after hours number for the clinic or go to the Emergency Department.   BELOW ARE SYMPTOMS THAT SHOULD BE REPORTED IMMEDIATELY:  *FEVER GREATER THAN 100.5 F  *CHILLS WITH OR WITHOUT FEVER  NAUSEA AND VOMITING THAT IS NOT CONTROLLED WITH YOUR NAUSEA MEDICATION  *UNUSUAL SHORTNESS OF BREATH  *UNUSUAL BRUISING OR BLEEDING  TENDERNESS IN MOUTH AND THROAT WITH OR WITHOUT PRESENCE OF ULCERS  *URINARY PROBLEMS  *BOWEL PROBLEMS  UNUSUAL RASH Items with * indicate a potential emergency and should be followed up as soon as possible.

## 2012-11-10 ENCOUNTER — Other Ambulatory Visit: Payer: Self-pay | Admitting: Certified Registered Nurse Anesthetist

## 2012-11-10 ENCOUNTER — Ambulatory Visit (HOSPITAL_BASED_OUTPATIENT_CLINIC_OR_DEPARTMENT_OTHER): Payer: BC Managed Care – PPO

## 2012-11-10 VITALS — BP 129/90 | HR 100 | Temp 98.4°F

## 2012-11-10 DIAGNOSIS — Z5189 Encounter for other specified aftercare: Secondary | ICD-10-CM

## 2012-11-10 DIAGNOSIS — C50211 Malignant neoplasm of upper-inner quadrant of right female breast: Secondary | ICD-10-CM

## 2012-11-10 DIAGNOSIS — C50219 Malignant neoplasm of upper-inner quadrant of unspecified female breast: Secondary | ICD-10-CM

## 2012-11-10 MED ORDER — PEGFILGRASTIM INJECTION 6 MG/0.6ML
6.0000 mg | Freq: Once | SUBCUTANEOUS | Status: AC
  Administered 2012-11-10: 6 mg via SUBCUTANEOUS
  Filled 2012-11-10: qty 0.6

## 2012-11-11 ENCOUNTER — Encounter: Payer: Self-pay | Admitting: Oncology

## 2012-11-13 ENCOUNTER — Encounter: Payer: Self-pay | Admitting: Genetic Counselor

## 2012-11-15 ENCOUNTER — Encounter: Payer: Self-pay | Admitting: Physician Assistant

## 2012-11-15 ENCOUNTER — Ambulatory Visit (HOSPITAL_BASED_OUTPATIENT_CLINIC_OR_DEPARTMENT_OTHER): Payer: BC Managed Care – PPO | Admitting: Physician Assistant

## 2012-11-15 ENCOUNTER — Other Ambulatory Visit (HOSPITAL_BASED_OUTPATIENT_CLINIC_OR_DEPARTMENT_OTHER): Payer: BC Managed Care – PPO | Admitting: Lab

## 2012-11-15 VITALS — BP 112/76 | HR 115 | Temp 98.7°F | Resp 20 | Ht 62.0 in | Wt 145.0 lb

## 2012-11-15 DIAGNOSIS — I10 Essential (primary) hypertension: Secondary | ICD-10-CM

## 2012-11-15 DIAGNOSIS — C50219 Malignant neoplasm of upper-inner quadrant of unspecified female breast: Secondary | ICD-10-CM

## 2012-11-15 DIAGNOSIS — Z170 Estrogen receptor positive status [ER+]: Secondary | ICD-10-CM

## 2012-11-15 DIAGNOSIS — C50911 Malignant neoplasm of unspecified site of right female breast: Secondary | ICD-10-CM

## 2012-11-15 DIAGNOSIS — C50211 Malignant neoplasm of upper-inner quadrant of right female breast: Secondary | ICD-10-CM

## 2012-11-15 LAB — CBC WITH DIFFERENTIAL/PLATELET
BASO%: 0.1 % (ref 0.0–2.0)
Basophils Absolute: 0 10*3/uL (ref 0.0–0.1)
EOS%: 0.9 % (ref 0.0–7.0)
HCT: 35.8 % (ref 34.8–46.6)
HGB: 12 g/dL (ref 11.6–15.9)
LYMPH%: 21.9 % (ref 14.0–49.7)
MCH: 30.8 pg (ref 25.1–34.0)
MCHC: 33.6 g/dL (ref 31.5–36.0)
MONO#: 0.5 10*3/uL (ref 0.1–0.9)
NEUT%: 67 % (ref 38.4–76.8)
Platelets: 125 10*3/uL — ABNORMAL LOW (ref 145–400)

## 2012-11-15 LAB — COMPREHENSIVE METABOLIC PANEL (CC13)
ALT: 30 U/L (ref 0–55)
BUN: 12.8 mg/dL (ref 7.0–26.0)
CO2: 31 mEq/L — ABNORMAL HIGH (ref 22–29)
Calcium: 9.2 mg/dL (ref 8.4–10.4)
Creatinine: 0.7 mg/dL (ref 0.6–1.1)
Total Bilirubin: 0.51 mg/dL (ref 0.20–1.20)

## 2012-11-15 NOTE — Progress Notes (Signed)
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 At that time a digital bilateal diagnostic mammogram showed a slight decrease in size of the benign fibroadenoma in the left breast compared to 2010. The same mammogram, however, also refilled pleomorphic calcifications extending over 9 cm in the inner third of the right breast. There were no suspicious calcifications in the left breast. On exam, Dr. Lyman Bishop was able to palpate a small freely mobile mass in the lower inner left breast, but was unable to palpate any abnormalities in the right breast other than some focal thickening.  An subsequent ultrasound of the right breast on 06/16/2012 showed multiple hypoechoic masses throughout the upper quadrants of the right breast. The palpable mass in the 1:00 location corresponded with an irregular hypoechoic mass measuring 1.7 x 1.4 cm. There was also a discrete hypoechoic lobulated mass at the 11:00 position measuring 2.1 x 1.1 x 2.1 cm. The third lesion identified at the 11:00 location, 3 cm from the nipple, measured 1.0 x 0.8 x 0.9 cm.   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  Bilateral breast MRI 06/23/2012 showed an enhancing mass in the upper central right breast measuring 1.8 cm, together with clumped nodular enhancement extending for a total area of 11.4 cm. In the upper outer quadrant of this breast there were 3 discrete  irregular masses measuring up to 3 cm. This correlated well with the ultrasound findings previously. In the left breast there was a large area of clumped nodular enhancement measuring up to 8.9 cm. In the lower central portion there was a 1.7 cm circumscribed nodule consistent with a known fibroadenoma. There weas no axillary or internal mammary adenopathy noted on either side.   The 8.9 cm area of abnormality in the left breast was biopsied on 06/30/2012 showing fibrocystic changes with adenosis and calcifications, pseudoangiomatous stromal hyperplasia, but no evidence of malignancy.  The patient underwent bilateral mastectomies under the care of Dr. Luisa Hart on 07/14/2012. The left mastectomy revealed benign breast tissue and was negative for malignancy. 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 Lymphovascular invasion was identified; #2) a ductal carcinoma in situ, grade 3, with comedo necrosis.  Surgical margins were negative. 4 lymph nodes were examined, one positive for metastatic mammary carcinoma, a second positive for isolated tumor cells. pT2, pN1a  Subsequent history is detailed below.  INTERVAL HISTORY: Sherry Barry returns today  for followup of her right breast cancer. She's currently day 7 cycle 5 of 6 planned adjuvant cycles of docetaxel/carboplatin/trastuzumab. She receives Neulasta on day 2 for granulocyte support.  Sherry Barry's biggest complaint today is continued fatigue. She tells me she is up and about most of the day, but finds that she has to rest more frequently. She's had only some fleeting numbness isolated in her left great toe. Currently, however, she  denies any signs of peripheral neuropathy in either the upper or lower extremities.     REVIEW OF SYSTEMS: Sherry Barry has had no fevers, chills, or night sweats. She denies any rashes or skin changes, other than some mild  hyperpigmentation of the nailbeds. These are not tender, and she denies any evidence of drainage or infection under the nail bed.  She's had no nausea, or change in bowel movements.  She's had no additional abnormal bruising or bleeding. She's had no oral ulcerations or sensitivity.  She denies any cough, shortness of breath, orthopnea, or chest pain.  She denies any abnormal headaches, dizziness, or change in vision. She denies any myalgias, arthralgias, or bony pain and has had no peripheral swelling.   A detailed review of systems is otherwise stable and noncontributory.   PAST MEDICAL HISTORY: Past Medical History  Diagnosis Date  . Allergy   . Migraine   . Migraine   . History of migraine     last one about a week ago  . History of chemotherapy     doxetaxel/carboplatin/trastuzumab  . Breast cancer   . Breast cancer     right  . Hypertension     PAST SURGICAL HISTORY: Past Surgical History  Procedure Laterality Date  . Simple mastectomy with axillary sentinel node biopsy  07/14/2012    Procedure: SIMPLE MASTECTOMY WITH AXILLARY SENTINEL NODE BIOPSY;  Surgeon: Clovis Pu. Cornett, MD;  Location: [facility] OR;  Service: General;  Laterality: Right;  Bilateral simple mastectomy with port and right sebtibel lymph node mapping  . Simple mastectomy with axillary sentinel node biopsy  07/14/2012    Procedure: SIMPLE MASTECTOMY;  Surgeon: Clovis Pu. Cornett, MD;  Location: [facility] OR;  Service: General;  Laterality: Left;  . Portacath placement  07/14/2012    Procedure: INSERTION PORT-A-CATH;  Surgeon: Clovis Pu. Cornett, MD;  Location: [facility] OR;  Service: General;  Laterality: Left;  . Abdominal hysterectomy      no salpingo-oophorectomy    FAMILY HISTORY Family History  Problem Relation Age of Onset  . Hypertension Mother   . Alcohol abuse Mother   . Heart disease Maternal Grandmother   . Stroke Maternal Grandfather   . Colon cancer Maternal Aunt 55    alive at 51  . Brain cancer Maternal Uncle  40    and lymphoma in early 42s; deceased  . Brain cancer Maternal Uncle 60    deceased  . Pancreatic cancer Maternal Uncle 45    alive at 63  . Breast cancer Maternal Aunt     great aunt through mat GF; dx at ? age  . Breast cancer Cousin 65    mat 1st cousin once removed through mat GF ; deceased   the patient's father died in an automobile accident in his early 68s. The patient's mother is living, currently age 72. The patient has one brother and one sister. There is no family history of breast or ovarian cancer, although there are maternal uncles with brain, colon and lymph node cancers.  GYNECOLOGIC HISTORY: Menarche age 86, first live birth age 54, she is GX P2. She stopped having periods with her surgery March of 2009. She does have "premenstrual symptoms" on a regular basis.  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 Her husband Brand Males works for a company in the [place] area doing testing. Their children Jaden (9) and Bryson (4) at home.   ADVANCED DIRECTIVES: not in place  HEALTH  MAINTENANCE: History  Substance Use Topics  . Smoking status: Never Smoker   . Smokeless tobacco: Never Used  . Alcohol Use: Yes     Comment: occasional     Colonoscopy: Never  PAP: 09/2010  Bone density: Never  Lipid panel:  Allergies  Allergen Reactions  . Codeine Itching  . Latex Swelling    Current Outpatient Prescriptions  Medication Sig Dispense Refill  . dexamethasone (DECADRON) 4 MG tablet 2 tabs by mouth twice daily on day before and 3 days after each chemo cycle  30 tablet  1  . lidocaine-prilocaine (EMLA) cream Apply topically as needed. Apply to port 1-2 hours before chemo and cover with plastic wrap  30 g  0  . lisinopril (PRINIVIL) 5 MG tablet Take 1 tablet (5 mg total) by mouth daily.  30 tablet  1  . LORazepam (ATIVAN) 0.5 MG tablet Take 1 tablet (0.5 mg total) by mouth every 6 (six) hours as needed (Nausea  or vomiting).  30 tablet  0  . mometasone (NASONEX) 50 MCG/ACT nasal spray Place 2 sprays into the nose daily.      . ondansetron (ZOFRAN) 8 MG tablet Take 1 tablet (8 mg total) by mouth 2 (two) times daily. Take two times a day starting the day after chemo for 3 days. Then take two times a day as needed for nausea or vomiting.  30 tablet  1  . prochlorperazine (COMPAZINE) 10 MG tablet Take 1 tablet (10 mg total) by mouth every 6 (six) hours as needed (Nausea or vomiting).  30 tablet  1  . valACYclovir (VALTREX) 500 MG tablet Take 1 tablet (500 mg total) by mouth 2 (two) times daily.  30 tablet  3   No current facility-administered medications for this visit.    OBJECTIVE: Young-appearing African American woman in no acute distress Filed Vitals:   11/15/12 0932  BP: 112/76  Pulse: 115  Temp: 98.7 F (37.1 C)  Resp: 20     Body mass index is 26.51 kg/(m^2).    ECOG FS: 1 Filed Weights   11/15/12 0932  Weight: 145 lb (65.772 kg)    Sclerae unicteric Oropharynx clear, with no ulcerations or evidence of candidiasis. No cervical or supraclavicular adenopathy Lungs clear to auscultation, no wheezes or rhonchi Heart regular rate and rhythm Abdomen soft, nontender to palpation, positive bowel sounds MSK no focal spinal tenderness to palpation No peripheral edema Neuro: nonfocal. Well oriented with friendly affect. Breasts: Deferred. Both axillae are benign with no palpable adenopathy. Port is intact in the left upper chest wall with no erythema, edema, or evidence of infection.    LAB RESULTS: Lab Results  Component Value Date   WBC 5.4 11/15/2012   NEUTROABS 3.6 11/15/2012   HGB 12.0 11/15/2012   HCT 35.8 11/15/2012   MCV 91.9 11/15/2012   PLT 125* 11/15/2012      Chemistry      Component Value Date/Time   NA 139 11/15/2012 0904   NA 135 07/15/2012 0500   K 3.8 11/15/2012 0904   K 3.9 07/15/2012 0500   CL 101 11/15/2012 0904   CL 103 07/15/2012 0500   CO2 31* 11/15/2012 0904   CO2 25  07/15/2012 0500   BUN 12.8 11/15/2012 0904   BUN 7 07/15/2012 0500   CREATININE 0.7 11/15/2012 0904   CREATININE 0.62 07/15/2012 0500   CREATININE 0.80 05/02/2012 1412      Component Value Date/Time   CALCIUM 9.2 11/15/2012 0904  CALCIUM 8.2* 07/15/2012 0500   ALKPHOS 87 11/15/2012 0904   ALKPHOS 46 07/14/2012 2300   AST 13 11/15/2012 0904   AST 14 07/14/2012 2300   ALT 30 11/15/2012 0904   ALT 7 07/14/2012 2300   BILITOT 0.51 11/15/2012 0904   BILITOT 0.3 07/14/2012 2300       Lab Results  Component Value Date   LABCA2 41* 06/28/2012     STUDIES:  Echocardiogram 10/30/2012 showed an ejection fraction of 60 %.      ASSESSMENT: 42 y.o. [place] woman status post right mastectomy and sentinel lymph node sampling 07/14/2012 (with simple left mastectomy)  for a pT2, pN1, stage IIB invasive ductal carcinoma, grade 3, estrogen receptor 100% and progesterone receptor 100% positive, with an MIB-1 of 48%, and HER-2 amplified by CISH with a ratio of 3.02.  (1) adjuvant chemotherapy started 08/17/2012, consisting of carboplatin, docetaxel and trastuzumab to be given every 3 weeks for 6 cycles  (2) trastuzumab to be continued to March of 2015. Most recent echocardiogram 10/30/2012  (3)  Hypertension, better controlled on lisinopril since 09/28/2012  PLAN:  Sherry Barry continues to tolerate treatment well, and is looking forward to her sixth and final dose of adjuvant chemotherapy on June 19. I will see her that day for repeat labs and physical exam. Of course she will continue to receive trastuzumab every 3 weeks, and her next couple doses have also been scheduled for her.  Her next echocardiogram will be due in mid August.  Sherry Barry voices understanding and agreement with this plan, and knows to call with any changes or problems prior to her next appointment.    Minha Fulco    11/15/2012

## 2012-11-24 NOTE — Progress Notes (Signed)
Reviewed and agree.

## 2012-11-30 ENCOUNTER — Ambulatory Visit (HOSPITAL_BASED_OUTPATIENT_CLINIC_OR_DEPARTMENT_OTHER): Payer: BC Managed Care – PPO

## 2012-11-30 ENCOUNTER — Ambulatory Visit (HOSPITAL_BASED_OUTPATIENT_CLINIC_OR_DEPARTMENT_OTHER): Payer: BC Managed Care – PPO | Admitting: Physician Assistant

## 2012-11-30 ENCOUNTER — Other Ambulatory Visit (HOSPITAL_BASED_OUTPATIENT_CLINIC_OR_DEPARTMENT_OTHER): Payer: BC Managed Care – PPO | Admitting: Lab

## 2012-11-30 ENCOUNTER — Encounter: Payer: Self-pay | Admitting: Physician Assistant

## 2012-11-30 VITALS — BP 148/96 | HR 120 | Temp 98.8°F | Resp 20 | Ht 62.0 in | Wt 150.9 lb

## 2012-11-30 DIAGNOSIS — C50219 Malignant neoplasm of upper-inner quadrant of unspecified female breast: Secondary | ICD-10-CM

## 2012-11-30 DIAGNOSIS — Z5111 Encounter for antineoplastic chemotherapy: Secondary | ICD-10-CM

## 2012-11-30 DIAGNOSIS — C50211 Malignant neoplasm of upper-inner quadrant of right female breast: Secondary | ICD-10-CM

## 2012-11-30 DIAGNOSIS — Z170 Estrogen receptor positive status [ER+]: Secondary | ICD-10-CM

## 2012-11-30 DIAGNOSIS — Z5112 Encounter for antineoplastic immunotherapy: Secondary | ICD-10-CM

## 2012-11-30 DIAGNOSIS — I10 Essential (primary) hypertension: Secondary | ICD-10-CM

## 2012-11-30 DIAGNOSIS — C50911 Malignant neoplasm of unspecified site of right female breast: Secondary | ICD-10-CM

## 2012-11-30 LAB — CBC WITH DIFFERENTIAL/PLATELET
BASO%: 0.1 % (ref 0.0–2.0)
Basophils Absolute: 0 10*3/uL (ref 0.0–0.1)
Eosinophils Absolute: 0 10*3/uL (ref 0.0–0.5)
HCT: 34.7 % — ABNORMAL LOW (ref 34.8–46.6)
HGB: 11.6 g/dL (ref 11.6–15.9)
LYMPH%: 11.8 % — ABNORMAL LOW (ref 14.0–49.7)
MCHC: 33.4 g/dL (ref 31.5–36.0)
MONO#: 0.9 10*3/uL (ref 0.1–0.9)
NEUT%: 78.8 % — ABNORMAL HIGH (ref 38.4–76.8)
Platelets: 220 10*3/uL (ref 145–400)
WBC: 9.6 10*3/uL (ref 3.9–10.3)
lymph#: 1.1 10*3/uL (ref 0.9–3.3)

## 2012-11-30 MED ORDER — SODIUM CHLORIDE 0.9 % IV SOLN
Freq: Once | INTRAVENOUS | Status: AC
  Administered 2012-11-30: 10:00:00 via INTRAVENOUS

## 2012-11-30 MED ORDER — ONDANSETRON 16 MG/50ML IVPB (CHCC)
16.0000 mg | Freq: Once | INTRAVENOUS | Status: AC
  Administered 2012-11-30: 16 mg via INTRAVENOUS

## 2012-11-30 MED ORDER — DIPHENHYDRAMINE HCL 25 MG PO CAPS
50.0000 mg | ORAL_CAPSULE | Freq: Once | ORAL | Status: AC
  Administered 2012-11-30: 50 mg via ORAL

## 2012-11-30 MED ORDER — HEPARIN SOD (PORK) LOCK FLUSH 100 UNIT/ML IV SOLN
500.0000 [IU] | Freq: Once | INTRAVENOUS | Status: AC | PRN
  Administered 2012-11-30: 500 [IU]
  Filled 2012-11-30: qty 5

## 2012-11-30 MED ORDER — SODIUM CHLORIDE 0.9 % IJ SOLN
10.0000 mL | INTRAMUSCULAR | Status: DC | PRN
  Administered 2012-11-30: 10 mL
  Filled 2012-11-30: qty 10

## 2012-11-30 MED ORDER — SODIUM CHLORIDE 0.9 % IV SOLN
750.0000 mg | Freq: Once | INTRAVENOUS | Status: AC
  Administered 2012-11-30: 750 mg via INTRAVENOUS
  Filled 2012-11-30: qty 75

## 2012-11-30 MED ORDER — DEXAMETHASONE SODIUM PHOSPHATE 20 MG/5ML IJ SOLN
20.0000 mg | Freq: Once | INTRAMUSCULAR | Status: AC
  Administered 2012-11-30: 20 mg via INTRAVENOUS

## 2012-11-30 MED ORDER — ACETAMINOPHEN 325 MG PO TABS
650.0000 mg | ORAL_TABLET | Freq: Once | ORAL | Status: AC
  Administered 2012-11-30: 650 mg via ORAL

## 2012-11-30 MED ORDER — SODIUM CHLORIDE 0.9 % IV SOLN
75.0000 mg/m2 | Freq: Once | INTRAVENOUS | Status: AC
  Administered 2012-11-30: 120 mg via INTRAVENOUS
  Filled 2012-11-30: qty 12

## 2012-11-30 MED ORDER — TRASTUZUMAB CHEMO INJECTION 440 MG
378.0000 mg | Freq: Once | INTRAVENOUS | Status: AC
  Administered 2012-11-30: 378 mg via INTRAVENOUS
  Filled 2012-11-30: qty 18

## 2012-11-30 NOTE — Progress Notes (Signed)
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 At that time a digital bilateal diagnostic mammogram showed a slight decrease in size of the benign fibroadenoma in the left breast compared to 2010. The same mammogram, however, also refilled pleomorphic calcifications extending over 9 cm in the inner third of the right breast. There were no suspicious calcifications in the left breast. On exam, Dr. Lyman Bishop was able to palpate a small freely mobile mass in the lower inner left breast, but was unable to palpate any abnormalities in the right breast other than some focal thickening.  An subsequent ultrasound of the right breast on 06/16/2012 showed multiple hypoechoic masses throughout the upper quadrants of the right breast. The palpable mass in the 1:00 location corresponded with an irregular hypoechoic mass measuring 1.7 x 1.4 cm. There was also a discrete hypoechoic lobulated mass at the 11:00 position measuring 2.1 x 1.1 x 2.1 cm. The third lesion identified at the 11:00 location, 3 cm from the nipple, measured 1.0 x 0.8 x 0.9 cm.   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  Bilateral breast MRI 06/23/2012 showed an enhancing mass in the upper central right breast measuring 1.8 cm, together with clumped nodular enhancement extending for a total area of 11.4 cm. In the upper outer quadrant of this breast there were 3 discrete  irregular masses measuring up to 3 cm. This correlated well with the ultrasound findings previously. In the left breast there was a large area of clumped nodular enhancement measuring up to 8.9 cm. In the lower central portion there was a 1.7 cm circumscribed nodule consistent with a known fibroadenoma. There weas no axillary or internal mammary adenopathy noted on either side.   The 8.9 cm area of abnormality in the left breast was biopsied on 06/30/2012 showing fibrocystic changes with adenosis and calcifications, pseudoangiomatous stromal hyperplasia, but no evidence of malignancy.  The patient underwent bilateral mastectomies under the care of Dr. Luisa Hart on 07/14/2012. The left mastectomy revealed benign breast tissue and was negative for malignancy. 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 Lymphovascular invasion was identified; #2) a ductal carcinoma in situ, grade 3, with comedo necrosis.  Surgical margins were negative. 4 lymph nodes were examined, one positive for metastatic mammary carcinoma, a second positive for isolated tumor cells. pT2, pN1a  Subsequent history is detailed below.  INTERVAL HISTORY: Sherry Barry returns today accompanied by her husband [name]  for followup of her right breast cancer. She is due for her sixth and final cycle of docetaxel/carboplatin/trastuzumab today, given in the adjuvant setting. She receives Neulasta on day 2 for granulocyte support.  Sherry Barry is feeling well today and is very excited about completing her adjuvant chemotherapy. Her energy level has improved. She feels well recovered from cycle 5, and currently she denies any signs of peripheral  neuropathy in either the upper or lower extremities.  REVIEW OF SYSTEMS: Sherry Barry has had no fevers, chills, or night sweats. She denies any rashes or skin changes, other than some mild hyperpigmentation of the  nailbeds. These are not tender, and she denies any evidence of drainage or infection under the nail bed.  She's had no nausea, or change in bowel movements.  She's had no additional abnormal bruising or bleeding. She's had no oral ulcerations or sensitivity.  She denies any cough, shortness of breath, orthopnea, or chest pain.  She denies any abnormal headaches, dizziness, or change in vision. She denies any myalgias, arthralgias, or bony pain and has had no peripheral swelling.   A detailed review of systems is otherwise stable and noncontributory.   PAST MEDICAL HISTORY: Past Medical History  Diagnosis Date  . Allergy   . Migraine   . Migraine   . History of migraine     last one about a week ago  . History of chemotherapy     doxetaxel/carboplatin/trastuzumab  . Breast cancer   . Breast cancer     right  . Hypertension     PAST SURGICAL HISTORY: Past Surgical History  Procedure Laterality Date  . Simple mastectomy with axillary sentinel node biopsy  07/14/2012    Procedure: SIMPLE MASTECTOMY WITH AXILLARY SENTINEL NODE BIOPSY;  Surgeon: Clovis Pu. Cornett, MD;  Location: [facility] OR;  Service: General;  Laterality: Right;  Bilateral simple mastectomy with port and right sebtibel lymph node mapping  . Simple mastectomy with axillary sentinel node biopsy  07/14/2012    Procedure: SIMPLE MASTECTOMY;  Surgeon: Clovis Pu. Cornett, MD;  Location: [facility] OR;  Service: General;  Laterality: Left;  . Portacath placement  07/14/2012    Procedure: INSERTION PORT-A-CATH;  Surgeon: Clovis Pu. Cornett, MD;  Location: [facility] OR;  Service: General;  Laterality: Left;  . Abdominal hysterectomy      no salpingo-oophorectomy    FAMILY HISTORY Family History  Problem Relation Age of Onset  . Hypertension Mother   . Alcohol abuse Mother   . Heart disease Maternal Grandmother   . Stroke Maternal Grandfather   . Colon cancer Maternal Aunt 55    alive at 70  . Brain cancer Maternal Uncle 86    and lymphoma in  early 40s; deceased  . Brain cancer Maternal Uncle 60    deceased  . Pancreatic cancer Maternal Uncle 45    alive at 41  . Breast cancer Maternal Aunt     great aunt through mat GF; dx at ? age  . Breast cancer Cousin 65    mat 1st cousin once removed through mat GF ; deceased   the patient's father died in an automobile accident in his early 24s. The patient's mother is living, currently age 44. The patient has one brother and one sister. There is no family history of breast or ovarian cancer, although there are maternal uncles with brain, colon and lymph node cancers.  GYNECOLOGIC HISTORY: Menarche age 27, first live birth age 63, she is GX P2. She stopped having periods with her surgery March of 2009. She does have "premenstrual symptoms" on a regular basis.  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 Her husband Brand Males works for a company in the [place] area doing testing. Their children Jaden (9) and Bryson (4) at home.   ADVANCED DIRECTIVES: not in place  HEALTH MAINTENANCE: History  Substance Use Topics  .  Smoking status: Never Smoker   . Smokeless tobacco: Never Used  . Alcohol Use: Yes     Comment: occasional     Colonoscopy: Never  PAP: 09/2010  Bone density: Never  Lipid panel:  Allergies  Allergen Reactions  . Codeine Itching  . Latex Swelling    Current Outpatient Prescriptions  Medication Sig Dispense Refill  . dexamethasone (DECADRON) 4 MG tablet 2 tabs by mouth twice daily on day before and 3 days after each chemo cycle  30 tablet  1  . lidocaine-prilocaine (EMLA) cream Apply topically as needed. Apply to port 1-2 hours before chemo and cover with plastic wrap  30 g  0  . lisinopril (PRINIVIL) 5 MG tablet Take 1 tablet (5 mg total) by mouth daily.  30 tablet  1  . LORazepam (ATIVAN) 0.5 MG tablet Take 1 tablet (0.5 mg total) by mouth every 6 (six) hours as needed (Nausea or vomiting).  30 tablet   0  . mometasone (NASONEX) 50 MCG/ACT nasal spray Place 2 sprays into the nose daily.      . ondansetron (ZOFRAN) 8 MG tablet Take 1 tablet (8 mg total) by mouth 2 (two) times daily. Take two times a day starting the day after chemo for 3 days. Then take two times a day as needed for nausea or vomiting.  30 tablet  1  . prochlorperazine (COMPAZINE) 10 MG tablet Take 1 tablet (10 mg total) by mouth every 6 (six) hours as needed (Nausea or vomiting).  30 tablet  1  . valACYclovir (VALTREX) 500 MG tablet Take 1 tablet (500 mg total) by mouth 2 (two) times daily.  30 tablet  3   No current facility-administered medications for this visit.    OBJECTIVE: Young-appearing African American woman in no acute distress Filed Vitals:   11/30/12 0924  BP: 148/96  Pulse: 120  Temp: 98.8 F (37.1 C)  Resp: 20     Body mass index is 27.59 kg/(m^2).    ECOG FS: 1 Filed Weights   11/30/12 0924  Weight: 150 lb 14.4 oz (68.448 kg)    Sclerae unicteric Oropharynx clear, with no ulcerations or evidence of candidiasis. No cervical or supraclavicular adenopathy Lungs clear to auscultation, no wheezes or rhonchi Heart regular rate and rhythm Abdomen soft, nontender to palpation, positive bowel sounds MSK no focal spinal tenderness to palpation No peripheral edema Neuro: nonfocal. Well oriented with friendly affect. Breasts: Deferred. Both axillae are benign with no palpable adenopathy. Port is intact in the left upper chest wall with no erythema, edema, or evidence of infection. Mild hyperpigmentation of the nailbeds and skin of the hands and feet bilaterally.    LAB RESULTS: Lab Results  Component Value Date   WBC 9.6 11/30/2012   NEUTROABS 7.6* 11/30/2012   HGB 11.6 11/30/2012   HCT 34.7* 11/30/2012   MCV 94.0 11/30/2012   PLT 220 Large platelets present 11/30/2012      Chemistry      Component Value Date/Time   NA 139 11/15/2012 0904   NA 135 07/15/2012 0500   K 3.8 11/15/2012 0904   K 3.9  07/15/2012 0500   CL 101 11/15/2012 0904   CL 103 07/15/2012 0500   CO2 31* 11/15/2012 0904   CO2 25 07/15/2012 0500   BUN 12.8 11/15/2012 0904   BUN 7 07/15/2012 0500   CREATININE 0.7 11/15/2012 0904   CREATININE 0.62 07/15/2012 0500   CREATININE 0.80 05/02/2012 1412      Component  Value Date/Time   CALCIUM 9.2 11/15/2012 0904   CALCIUM 8.2* 07/15/2012 0500   ALKPHOS 87 11/15/2012 0904   ALKPHOS 46 07/14/2012 2300   AST 13 11/15/2012 0904   AST 14 07/14/2012 2300   ALT 30 11/15/2012 0904   ALT 7 07/14/2012 2300   BILITOT 0.51 11/15/2012 0904   BILITOT 0.3 07/14/2012 2300       Lab Results  Component Value Date   LABCA2 41* 06/28/2012     STUDIES:  Echocardiogram 10/30/2012 showed an ejection fraction of 60 %.      ASSESSMENT: 42 y.o. Sherry Barry woman status post right mastectomy and sentinel lymph node sampling 07/14/2012 (with simple left mastectomy)  for a pT2, pN1, stage IIB invasive ductal carcinoma, grade 3, estrogen receptor 100% and progesterone receptor 100% positive, with an MIB-1 of 48%, and HER-2 amplified by CISH with a ratio of 3.02.  (1) adjuvant chemotherapy started 08/17/2012, consisting of carboplatin, docetaxel and trastuzumab to be given every 3 weeks for 6 cycles  (2) trastuzumab to be continued to March of 2015. Most recent echocardiogram 10/30/2012  (3)  Hypertension, better controlled on lisinopril since 09/28/2012  PLAN:  Sherry Barry will proceed to treatment today as scheduled for her sixth and final dose of adjuvant chemotherapy.  She will be continuing with trastuzumab every 3 weeks, and her next several doses have also been scheduled for her. Her next echocardiogram will be due in mid August, and that is being scheduled as well.  Sherry Barry will followup with Dr. Darnelle Catalan next week, and is also scheduled to meet with Dr. Dayton Scrape on June 26 to discuss her upcoming radiation therapy. Sherry Barry voices understanding and agreement with this plan, and knows to call with any changes or problems  prior to her next appointment.   Sherry Barry    11/30/2012

## 2012-11-30 NOTE — Patient Instructions (Addendum)
[facility] Discharge Instructions for Patients Receiving Chemotherapy  Today you received the following chemotherapy agents Taxotere, Carboplatin and Herceptin.  To help prevent nausea and vomiting after your treatment, we encourage you to take your nausea medication as prescribed.   If you develop nausea and vomiting that is not controlled by your nausea medication, call the clinic.   BELOW ARE SYMPTOMS THAT SHOULD BE REPORTED IMMEDIATELY:  *FEVER GREATER THAN 100.5 F  *CHILLS WITH OR WITHOUT FEVER  NAUSEA AND VOMITING THAT IS NOT CONTROLLED WITH YOUR NAUSEA MEDICATION  *UNUSUAL SHORTNESS OF BREATH  *UNUSUAL BRUISING OR BLEEDING  TENDERNESS IN MOUTH AND THROAT WITH OR WITHOUT PRESENCE OF ULCERS  *URINARY PROBLEMS  *BOWEL PROBLEMS  UNUSUAL RASH Items with * indicate a potential emergency and should be followed up as soon as possible.  Feel free to call the clinic you have any questions or concerns. The clinic phone number is [phone].    

## 2012-12-01 ENCOUNTER — Ambulatory Visit (HOSPITAL_BASED_OUTPATIENT_CLINIC_OR_DEPARTMENT_OTHER): Payer: BC Managed Care – PPO

## 2012-12-01 VITALS — BP 122/86 | HR 106 | Temp 97.5°F | Resp 18

## 2012-12-01 DIAGNOSIS — Z5189 Encounter for other specified aftercare: Secondary | ICD-10-CM

## 2012-12-01 DIAGNOSIS — C50211 Malignant neoplasm of upper-inner quadrant of right female breast: Secondary | ICD-10-CM

## 2012-12-01 DIAGNOSIS — C50219 Malignant neoplasm of upper-inner quadrant of unspecified female breast: Secondary | ICD-10-CM

## 2012-12-01 MED ORDER — PEGFILGRASTIM INJECTION 6 MG/0.6ML
6.0000 mg | Freq: Once | SUBCUTANEOUS | Status: AC
  Administered 2012-12-01: 6 mg via SUBCUTANEOUS
  Filled 2012-12-01: qty 0.6

## 2012-12-01 NOTE — Patient Instructions (Signed)
Call MD for problems or concerns 

## 2012-12-06 NOTE — Progress Notes (Signed)
Location of Breast Cancer:Right Breast - Upper-outer quadrant  Histology per Pathology Report: 1. Breast, simple mastectomy, Left - BENIGN BREAST TISSUE, SEE COMMENT. - NEGATIVE FOR ATYPIA OR MALIGNANCY. - PREVIOUS BIOPSY SITE CHANGES IDENTIFIED. 2. 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 SEE COMMENT. - LYMPHOVASCULAR INVASION IDENTIFIED. - INVASIVE TUMOR IS 2.5 CM FROM THE NEAREST MARGIN (DEEP). - DUCTAL CARCINOMA IN SITU, GRADE III WITH COMEDO NECROSIS. TUMOR 2: UPPER MEDIAL - NO CLIP: - DUCTAL CARCINOMA IN SITU, GRADE III, WITH COMEDO NECROSIS. - INVASIVE TUMOR IS 4 CM FROM NEAREST MARGIN (DEEP). - SEE TUMOR SYNOPTIC TEMPLATE BELOW. 3. Lymph node, sentinel, biopsy, Right axillary - ONE LYMPH NODE, POSITIVE FOR METASTATIC MAMMARY CARCINOMA (1/1). SEE COMMENT. 4. Lymph node, sentinel, biopsy, Right axillary - ONE LYMPH NODE, NEGATIVE FOR TUMOR (0/1), SEE COMMENT. 5. Lymph node, sentinel, biopsy, Right axillary - ONE LYMPH NODE, POSITIVE FOR ISOLATED TUMOR CELLS (0/1), SEE COMMENT. 6. Lymph node, sentinel, biopsy, Right axillary - ONE LYMPH NODE, NEGATIVE FOR TUMOR (0/1)  Receptor Status: ER(100%), PR (100%), Her2-neu (3.02), Ki-67 (48%)  Did patient present with symptoms (if so, please note symptoms) or was this found on screening mammography?: Found on mammography on 06/05/2012  Past/Anticipated interventions by surgeon, if ZOX:WRUEA Simple Mastectomy, right breast  Past/Anticipated interventions by medical oncology, if any: Chemotherapy  - completed on 11/30/12  Carboplatin, docetaxel and Trastuzumab Q 3 weeks  X 6 cycles .   Lymphedema issues, if any: no  Pain issues, if any:  no  SAFETY ISSUES:  Prior radiation? no  Pacemaker/ICD? no  Possible current pregnancy?no  Is the patient on methotrexate? no  Current Complaints / other details:  No c/o pain, working full time in HR, [employer],  Donnelly Angelica, RN 12/06/2012,5:35 PM

## 2012-12-07 ENCOUNTER — Ambulatory Visit
Admission: RE | Admit: 2012-12-07 | Discharge: 2012-12-07 | Disposition: A | Discharge status: Home / Self Care | Payer: BC Managed Care – PPO | Source: Ambulatory Visit | Attending: Radiation Oncology | Admitting: Radiation Oncology

## 2012-12-07 ENCOUNTER — Ambulatory Visit (HOSPITAL_BASED_OUTPATIENT_CLINIC_OR_DEPARTMENT_OTHER): Payer: BC Managed Care – PPO | Admitting: Oncology

## 2012-12-07 ENCOUNTER — Other Ambulatory Visit (HOSPITAL_BASED_OUTPATIENT_CLINIC_OR_DEPARTMENT_OTHER): Payer: BC Managed Care – PPO | Admitting: Lab

## 2012-12-07 VITALS — BP 118/84 | HR 116 | Temp 98.8°F | Resp 20 | Wt 148.6 lb

## 2012-12-07 VITALS — BP 131/78 | HR 121 | Temp 99.1°F | Resp 20 | Ht 62.0 in | Wt 148.3 lb

## 2012-12-07 DIAGNOSIS — Z90710 Acquired absence of both cervix and uterus: Secondary | ICD-10-CM | POA: Insufficient documentation

## 2012-12-07 DIAGNOSIS — C50211 Malignant neoplasm of upper-inner quadrant of right female breast: Secondary | ICD-10-CM

## 2012-12-07 DIAGNOSIS — C50919 Malignant neoplasm of unspecified site of unspecified female breast: Secondary | ICD-10-CM | POA: Insufficient documentation

## 2012-12-07 DIAGNOSIS — C773 Secondary and unspecified malignant neoplasm of axilla and upper limb lymph nodes: Secondary | ICD-10-CM | POA: Insufficient documentation

## 2012-12-07 DIAGNOSIS — C50219 Malignant neoplasm of upper-inner quadrant of unspecified female breast: Secondary | ICD-10-CM

## 2012-12-07 DIAGNOSIS — Z9221 Personal history of antineoplastic chemotherapy: Secondary | ICD-10-CM | POA: Insufficient documentation

## 2012-12-07 DIAGNOSIS — C50911 Malignant neoplasm of unspecified site of right female breast: Secondary | ICD-10-CM

## 2012-12-07 DIAGNOSIS — Z170 Estrogen receptor positive status [ER+]: Secondary | ICD-10-CM | POA: Insufficient documentation

## 2012-12-07 LAB — COMPREHENSIVE METABOLIC PANEL (CC13)
Albumin: 3.5 g/dL (ref 3.5–5.0)
CO2: 26 mEq/L (ref 22–29)
Glucose: 98 mg/dl (ref 70–140)
Potassium: 3.5 mEq/L (ref 3.5–5.1)
Sodium: 141 mEq/L (ref 136–145)
Total Protein: 6.6 g/dL (ref 6.4–8.3)

## 2012-12-07 LAB — CBC WITH DIFFERENTIAL/PLATELET
Eosinophils Absolute: 0 10*3/uL (ref 0.0–0.5)
MONO#: 0.8 10*3/uL (ref 0.1–0.9)
NEUT#: 1.6 10*3/uL (ref 1.5–6.5)
RBC: 3.67 10*6/uL — ABNORMAL LOW (ref 3.70–5.45)
RDW: 15.4 % — ABNORMAL HIGH (ref 11.2–14.5)
WBC: 3.8 10*3/uL — ABNORMAL LOW (ref 3.9–10.3)

## 2012-12-07 MED ORDER — GABAPENTIN 300 MG PO CAPS: 300.0000 mg | ORAL_CAPSULE | Freq: Every day | ORAL | Status: DC

## 2012-12-07 NOTE — Progress Notes (Signed)
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 At that time a digital bilateal diagnostic mammogram showed a slight decrease in size of the benign fibroadenoma in the left breast compared to 2010. The same mammogram, however, also refilled pleomorphic calcifications extending over 9 cm in the inner third of the right breast. There were no suspicious calcifications in the left breast. On exam, Dr. Lyman Bishop was able to palpate a small freely mobile mass in the lower inner left breast, but was unable to palpate any abnormalities in the right breast other than some focal thickening.  An subsequent ultrasound of the right breast on 06/16/2012 showed multiple hypoechoic masses throughout the upper quadrants of the right breast. The palpable mass in the 1:00 location corresponded with an irregular hypoechoic mass measuring 1.7 x 1.4 cm. There was also a discrete hypoechoic lobulated mass at the 11:00 position measuring 2.1 x 1.1 x 2.1 cm. The third lesion identified at the 11:00 location, 3 cm from the nipple, measured 1.0 x 0.8 x 0.9 cm.   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  Bilateral breast MRI 06/23/2012 showed an enhancing mass in the upper central right breast measuring 1.8 cm, together with clumped nodular enhancement extending for a total area of 11.4 cm. In the upper outer quadrant of this breast there were 3 discrete  irregular masses measuring up to 3 cm. This correlated well with the ultrasound findings previously. In the left breast there was a large area of clumped nodular enhancement measuring up to 8.9 cm. In the lower central portion there was a 1.7 cm circumscribed nodule consistent with a known fibroadenoma. There weas no axillary or internal mammary adenopathy noted on either side.   The 8.9 cm area of abnormality in the left breast was biopsied on 06/30/2012 showing fibrocystic changes with adenosis and calcifications, pseudoangiomatous stromal hyperplasia, but no evidence of malignancy.  The patient underwent bilateral mastectomies under the care of Dr. Luisa Hart on 07/14/2012. The left mastectomy revealed benign breast tissue and was negative for malignancy. 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 Lymphovascular invasion was identified; #2) a ductal carcinoma in situ, grade 3, with comedo necrosis.  Surgical margins were negative. 4 lymph nodes were examined, one positive for metastatic mammary carcinoma, a second positive for isolated tumor cells. pT2, pN1a  Subsequent history is detailed below.  INTERVAL HISTORY: Sharmila returns today for followup of her right breast cancer. She completed her chemotherapy treatments a week ago. She is meeting with radiation oncology today.  REVIEW OF SYSTEMS: Allegra did remarkably well with her chemotherapy. She had no nausea or vomiting, no peripheral neuropathy, no swelling. The worst problem she is having his hot flashes. They are worse at night. She had some mouth sores after the fifth chemotherapy cycle but these have not recurred. A  detailed review of systems today was entirely negative except as noted.  PAST MEDICAL HISTORY: Past Medical History  Diagnosis Date  . Allergy   . Migraine   . Migraine   . History of migraine     last one about a week ago   . History of chemotherapy     doxetaxel/carboplatin/trastuzumab  . Breast cancer   . Breast cancer     right  . Hypertension     PAST SURGICAL HISTORY: Past Surgical History  Procedure Laterality Date  . Simple mastectomy with axillary sentinel node biopsy  07/14/2012    Procedure: SIMPLE MASTECTOMY WITH AXILLARY SENTINEL NODE BIOPSY;  Surgeon: Clovis Pu. Cornett, MD;  Location: [facility] OR;  Service: General;  Laterality: Right;  Bilateral simple mastectomy with port and right sebtibel lymph node mapping  . Simple mastectomy with axillary sentinel node biopsy  07/14/2012    Procedure: SIMPLE MASTECTOMY;  Surgeon: Clovis Pu. Cornett, MD;  Location: [facility] OR;  Service: General;  Laterality: Left;  . Portacath placement  07/14/2012    Procedure: INSERTION PORT-A-CATH;  Surgeon: Clovis Pu. Cornett, MD;  Location: [facility] OR;  Service: General;  Laterality: Left;  . Abdominal hysterectomy      no salpingo-oophorectomy    FAMILY HISTORY Family History  Problem Relation Age of Onset  . Hypertension Mother   . Alcohol abuse Mother   . Heart disease Maternal Grandmother   . Stroke Maternal Grandfather   . Colon cancer Maternal Aunt 55    alive at 102  . Brain cancer Maternal Uncle 37    and lymphoma in early 44s; deceased  . Brain cancer Maternal Uncle 60    deceased  . Pancreatic cancer Maternal Uncle 45    alive at 2  . Breast cancer Maternal Aunt     great aunt through mat GF; dx at ? age  . Breast cancer Cousin 65    mat 1st cousin once removed through mat GF ; deceased   the patient's father died in an automobile accident in his early 66s. The patient's mother is living, currently age 28. The patient has one brother and one sister. There is no family history of breast or ovarian cancer, although there are maternal uncles with brain, colon and lymph node cancers.  GYNECOLOGIC HISTORY: Menarche age 51, first live birth age 15, she is GX P2. She stopped having periods with her surgery March of  2009. She does have "premenstrual symptoms" on a regular basis.  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 Her husband Brand Males works for a company in the [place] area doing testing. Their children Jaden (9) and Bryson (4) at home.   ADVANCED DIRECTIVES: not in place  HEALTH MAINTENANCE: History  Substance Use Topics  . Smoking status: Never Smoker   . Smokeless tobacco: Never Used  . Alcohol Use: Yes     Comment: occasional     Colonoscopy: Never  PAP: 09/2010  Bone density: Never  Lipid panel:  Allergies  Allergen Reactions  . Codeine Itching  . Latex Swelling    Current Outpatient Prescriptions  Medication Sig Dispense Refill  . dexamethasone (DECADRON) 4 MG tablet 2 tabs by mouth twice daily on day before and 3 days after each chemo cycle  30 tablet  1  . lidocaine-prilocaine (EMLA) cream Apply topically as needed. Apply to port 1-2 hours before chemo and cover with plastic wrap  30 g  0  .  lisinopril (PRINIVIL) 5 MG tablet Take 1 tablet (5 mg total) by mouth daily.  30 tablet  1  . LORazepam (ATIVAN) 0.5 MG tablet Take 1 tablet (0.5 mg total) by mouth every 6 (six) hours as needed (Nausea or vomiting).  30 tablet  0  . mometasone (NASONEX) 50 MCG/ACT nasal spray Place 2 sprays into the nose daily.      . ondansetron (ZOFRAN) 8 MG tablet Take 1 tablet (8 mg total) by mouth 2 (two) times daily. Take two times a day starting the day after chemo for 3 days. Then take two times a day as needed for nausea or vomiting.  30 tablet  1  . prochlorperazine (COMPAZINE) 10 MG tablet Take 1 tablet (10 mg total) by mouth every 6 (six) hours as needed (Nausea or vomiting).  30 tablet  1  . valACYclovir (VALTREX) 500 MG tablet Take 1 tablet (500 mg total) by mouth 2 (two) times daily.  30 tablet  3   No current facility-administered medications for this visit.    OBJECTIVE: Young-appearing African American woman in no acute  distress Filed Vitals:   12/07/12 0941  BP: 131/78  Pulse: 121  Temp: 99.1 F (37.3 C)  Resp: 20     Body mass index is 27.12 kg/(m^2).    ECOG FS: 1 Filed Weights   12/07/12 0941  Weight: 148 lb 4.8 oz (67.268 kg)    Sclerae unicteric Oropharynx clear No cervical or supraclavicular adenopathy Lungs clear to auscultation, no wheezes or rhonchi Heart regular rate and rhythm Abdomen soft, nontender, positive bowel sounds MSK no focal spinal tenderness No peripheral edema Neuro: nonfocal. Well oriented, friendly affect. Breasts: She is status post bilateral mastectomies. There is no evidence of local recurrence on the right. The right axilla is benign. Port is intact in the left upper chest wall   LAB RESULTS: Lab Results  Component Value Date   WBC 3.8* 12/07/2012   NEUTROABS 1.6 12/07/2012   HGB 11.7 12/07/2012   HCT 34.6* 12/07/2012   MCV 94.3 12/07/2012   PLT 136* 12/07/2012      Chemistry      Component Value Date/Time   NA 141 12/07/2012 0921   NA 135 07/15/2012 0500   K 3.5 12/07/2012 0921   K 3.9 07/15/2012 0500   CL 101 11/15/2012 0904   CL 103 07/15/2012 0500   CO2 26 12/07/2012 0921   CO2 25 07/15/2012 0500   BUN 9.1 12/07/2012 0921   BUN 7 07/15/2012 0500   CREATININE 0.8 12/07/2012 0921   CREATININE 0.62 07/15/2012 0500   CREATININE 0.80 05/02/2012 1412      Component Value Date/Time   CALCIUM 9.2 12/07/2012 0921   CALCIUM 8.2* 07/15/2012 0500   ALKPHOS 73 12/07/2012 0921   ALKPHOS 46 07/14/2012 2300   AST 15 12/07/2012 0921   AST 14 07/14/2012 2300   ALT 19 12/07/2012 0921   ALT 7 07/14/2012 2300   BILITOT 0.39 12/07/2012 0921   BILITOT 0.3 07/14/2012 2300       Lab Results  Component Value Date   LABCA2 41* 06/28/2012     STUDIES:  Echocardiogram 10/30/2012 showed an ejection fraction of 60 %.      ASSESSMENT: 42 y.o. [place] woman status post right mastectomy and sentinel lymph node sampling 07/14/2012 (with simple left mastectomy)  for a pT2, pN1, stage IIB  invasive ductal carcinoma, grade 3, estrogen receptor 100% and progesterone receptor 100% positive, with an MIB-1 of 48%,  and HER-2 amplified by CISH with a ratio of 3.02.  (1) adjuvant chemotherapy started 08/17/2012, consisting of carboplatin, docetaxel and trastuzumab given every 3 weeks for 6 cycles, completed 11/30/2012  (2) trastuzumab to be continued to March of 2015. Most recent echocardiogram 10/30/2012  PLAN:  Adalynn completed her chemotherapy and is now ready to start her radiation treatments. We are continuing the trastuzumab every 3 weeks and she will need to have a repeat echo sometime in August. When she completes the radiation treatments we will start tamoxifen. She already has a visit with [place] scheduled for August 21 and before that visit I will obtain an [facility] and estradiol level.  We had written for a lisinopril, and she took it briefly, but it dropped her blood pressure too low, so she discontinued. Current blood pressure is acceptable.  I think she will benefit from gabapentin at bedtime and I went ahead and wrote her the prescription today. She knows to call for any problems that may develop before the next visit here.  Andrzej Scully C    12/07/2012

## 2012-12-07 NOTE — Progress Notes (Signed)
Please see the Nurse Progress Note in the MD Initial Consult Encounter for this patient. 

## 2012-12-07 NOTE — Progress Notes (Signed)
CC: Dr. 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 on 11/26/2012. A mammogram on 06/05/2012 showed highly suspicious calcifications involving most of the inner third of the right breast extending over 9 cm. Ultrasound on 06/16/2012 showed multiple suspicious masses within the right breast some of which were felt to represent fibroadenomas but other suspicious for invasive ductal carcinoma. Right breast biopsy on 06/16/2012 was diagnostic for invasive ductal carcinoma with DCIS. Invasive tumor was grade 2/3 and found to be ER positive at 100% and PR at 100% with an elevated proliferation marker of 48%. Her tumor was found to be HER-2/neu positive. She went bilateral mastectomies along with a sentinel lymph node biopsy on 07/14/2012. She was found have a 2.2 cm primary within the upper medial aspect of the right breast with an adjacent 1.2 cm invasive ductal carcinoma. Margins for both invasive carcinoma and DCIS were at least 2.5 cm. Her invasive disease was grade 3 and her DCIS was high grade as well. There was LV I. One of 4 lymph nodes contained metastatic disease in this was a macro metastasis. Of note is that there were isolated tumor cells in one of 4 lymph nodes. She went on to receive adjuvant chemotherapy beginning 08/17/2012 consisting of carboplatin, docetaxel and trastuzumab under the direction of Dr. Darnelle Catalan. Except for her trastuzumab she finished her chemotherapy last Thursday. She still doing well.  Physical examination: Alert and oriented. Head and neck examination: She wears a wig. Nodes: There is no palpable cervical, supraclavicular, or axillary lymphadenopathy. Chest: Bilateral mastectomies, lungs clear. No visible or palpable evidence for recurrent disease along the  right chest wall. Back: Without spinal discomfort. Abdomen: Without hepatomegaly. Extremities: Without edema. Neurologic examination: Grossly nonfocal.  Laboratory data: Lab Results  Component Value Date   WBC 3.8* 12/07/2012   HGB 11.7 12/07/2012   HCT 34.6* 12/07/2012   MCV 94.3 12/07/2012   PLT 136* 12/07/2012   Impression: Pathologic stage IIB (T2 N1 M0) invasive ductal/DCIS of the right breast. We again discussed the indications for post mastectomy radiation therapy. We discussed the potential acute and late toxicities, and consent was previously signed. I will have her return for simulation/treatment planning the week of July 7.  Plan: As above.  30 minutes was spent face-to-face with the patient, primarily counseling the patient and coordinating her care.

## 2012-12-08 NOTE — Addendum Note (Signed)
Addended by: Billey Co on: 12/08/2012 05:02 PM   Modules accepted: Medications

## 2012-12-21 ENCOUNTER — Ambulatory Visit (HOSPITAL_BASED_OUTPATIENT_CLINIC_OR_DEPARTMENT_OTHER): Payer: BC Managed Care – PPO

## 2012-12-21 ENCOUNTER — Other Ambulatory Visit (HOSPITAL_BASED_OUTPATIENT_CLINIC_OR_DEPARTMENT_OTHER): Payer: BC Managed Care – PPO | Admitting: Lab

## 2012-12-21 ENCOUNTER — Ambulatory Visit
Admission: RE | Admit: 2012-12-21 | Discharge: 2012-12-21 | Disposition: A | Discharge status: Home / Self Care | Payer: BC Managed Care – PPO | Source: Ambulatory Visit | Attending: Radiation Oncology | Admitting: Radiation Oncology

## 2012-12-21 VITALS — BP 133/87 | HR 112 | Temp 98.4°F | Resp 20

## 2012-12-21 DIAGNOSIS — C50911 Malignant neoplasm of unspecified site of right female breast: Secondary | ICD-10-CM

## 2012-12-21 DIAGNOSIS — C50219 Malignant neoplasm of upper-inner quadrant of unspecified female breast: Secondary | ICD-10-CM

## 2012-12-21 DIAGNOSIS — C50211 Malignant neoplasm of upper-inner quadrant of right female breast: Secondary | ICD-10-CM

## 2012-12-21 DIAGNOSIS — Z5112 Encounter for antineoplastic immunotherapy: Secondary | ICD-10-CM

## 2012-12-21 DIAGNOSIS — C50919 Malignant neoplasm of unspecified site of unspecified female breast: Secondary | ICD-10-CM | POA: Insufficient documentation

## 2012-12-21 DIAGNOSIS — Z510 Encounter for antineoplastic radiation therapy: Secondary | ICD-10-CM | POA: Insufficient documentation

## 2012-12-21 LAB — CBC WITH DIFFERENTIAL/PLATELET
BASO%: 0.4 % (ref 0.0–2.0)
EOS%: 0.9 % (ref 0.0–7.0)
MCH: 32.2 pg (ref 25.1–34.0)
MCHC: 33 g/dL (ref 31.5–36.0)
MONO%: 13 % (ref 0.0–14.0)
RDW: 15.8 % — ABNORMAL HIGH (ref 11.2–14.5)
lymph#: 1.3 10*3/uL (ref 0.9–3.3)

## 2012-12-21 MED ORDER — HEPARIN SOD (PORK) LOCK FLUSH 100 UNIT/ML IV SOLN
500.0000 [IU] | Freq: Once | INTRAVENOUS | Status: AC | PRN
  Administered 2012-12-21: 500 [IU]
  Filled 2012-12-21: qty 5

## 2012-12-21 MED ORDER — DIPHENHYDRAMINE HCL 25 MG PO CAPS
50.0000 mg | ORAL_CAPSULE | Freq: Once | ORAL | Status: AC
  Administered 2012-12-21: 50 mg via ORAL

## 2012-12-21 MED ORDER — SODIUM CHLORIDE 0.9 % IJ SOLN
10.0000 mL | INTRAMUSCULAR | Status: DC | PRN
  Administered 2012-12-21: 10 mL
  Filled 2012-12-21: qty 10

## 2012-12-21 MED ORDER — ACETAMINOPHEN 325 MG PO TABS
650.0000 mg | ORAL_TABLET | Freq: Once | ORAL | Status: AC
  Administered 2012-12-21: 650 mg via ORAL

## 2012-12-21 MED ORDER — SODIUM CHLORIDE 0.9 % IV SOLN
Freq: Once | INTRAVENOUS | Status: AC
  Administered 2012-12-21: 09:00:00 via INTRAVENOUS

## 2012-12-21 MED ORDER — TRASTUZUMAB CHEMO INJECTION 440 MG
378.0000 mg | Freq: Once | INTRAVENOUS | Status: AC
  Administered 2012-12-21: 378 mg via INTRAVENOUS
  Filled 2012-12-21: qty 18

## 2012-12-21 NOTE — Patient Instructions (Addendum)
[facility] Discharge Instructions for Patients Receiving Chemotherapy  Today you received the following chemotherapy agents Herceptin.  To help prevent nausea and vomiting after your treatment, we encourage you to take your nausea medication as prescribed.   If you develop nausea and vomiting that is not controlled by your nausea medication, call the clinic.   BELOW ARE SYMPTOMS THAT SHOULD BE REPORTED IMMEDIATELY:  *FEVER GREATER THAN 100.5 F  *CHILLS WITH OR WITHOUT FEVER  NAUSEA AND VOMITING THAT IS NOT CONTROLLED WITH YOUR NAUSEA MEDICATION  *UNUSUAL SHORTNESS OF BREATH  *UNUSUAL BRUISING OR BLEEDING  TENDERNESS IN MOUTH AND THROAT WITH OR WITHOUT PRESENCE OF ULCERS  *URINARY PROBLEMS  *BOWEL PROBLEMS  UNUSUAL RASH Items with * indicate a potential emergency and should be followed up as soon as possible.  Feel free to call the clinic you have any questions or concerns. The clinic phone number is [phone].    

## 2012-12-21 NOTE — Progress Notes (Signed)
Complex simulation/treatment planning note: The patient was taken to the CT simulator. She was placed on a custom breast board and a custom neck mold was constructed for immobilization. Her right chest wall field borders were marked with radiopaque wires. Her mastectomy scar was also marked with a radiopaque wire. She was then scanned. I chose an isocenter for a single isocenter setup along the right upper chest. She was set up to medial and lateral right chest wall tangents and 2 sets of multileaf collimators were designed to conform the field. She was then set up to her right supraclavicular/axillary region, LAO and a separate multileaf collimator was designed. Lastly, she was set up PA to her right axilla and a separate multileaf, was designed for a total of 5 complex treatment devices. I prescribing 5040 cGy in 28 sessions to the right chest wall and regional lymph nodes. She'll have construction of 1 cm custom bolus to be applied to the right chest wall every other day. After her chest wall treatment she will undergo a right mastectomy scar boost to deliver a further 1000 cGy 5 sessions utilizing 6 MEV electrons.

## 2012-12-28 ENCOUNTER — Ambulatory Visit
Admission: RE | Admit: 2012-12-28 | Discharge: 2012-12-28 | Disposition: A | Discharge status: Home / Self Care | Payer: BC Managed Care – PPO | Source: Ambulatory Visit | Attending: Radiation Oncology | Admitting: Radiation Oncology

## 2012-12-28 DIAGNOSIS — C50211 Malignant neoplasm of upper-inner quadrant of right female breast: Secondary | ICD-10-CM

## 2012-12-28 NOTE — Progress Notes (Signed)
Simulation verification note: The patient underwent simulation verification for treatment to her right chest wall and regional lymph nodes.  Her isocenter is in good position and the multileaf collimators contoured the treatment volume appropriately. 

## 2013-01-01 ENCOUNTER — Ambulatory Visit
Admission: RE | Admit: 2013-01-01 | Discharge: 2013-01-01 | Disposition: A | Discharge status: Home / Self Care | Payer: BC Managed Care – PPO | Source: Ambulatory Visit | Attending: Radiation Oncology | Admitting: Radiation Oncology

## 2013-01-01 VITALS — BP 128/95 | HR 100 | Temp 98.4°F | Ht 62.0 in | Wt 151.8 lb

## 2013-01-01 DIAGNOSIS — C50211 Malignant neoplasm of upper-inner quadrant of right female breast: Secondary | ICD-10-CM

## 2013-01-01 MED ORDER — ALRA NON-METALLIC DEODORANT (RAD-ONC)
1.0000 "application " | Freq: Once | TOPICAL | Status: AC
  Administered 2013-01-01: 1 via TOPICAL

## 2013-01-01 MED ORDER — RADIAPLEXRX EX GEL
Freq: Once | CUTANEOUS | Status: AC
  Administered 2013-01-01: 12:00:00 via TOPICAL

## 2013-01-01 NOTE — Progress Notes (Signed)
   Weekly Management Note:  outpatient Current Dose:  1.8 Gy Projected Dose: 50.4 Gy +boost  Narrative:  The patient presents for routine under treatment assessment.  CBCT/MVCT images/Port film x-rays were reviewed.  The chart was checked- doing well. She does have diffuse aches in her arms and legs bilaterally, more so in the mornings. The achiness in her upper arms and her lower legs bilaterally. It is symmetric. No swelling in her extremities  Physical Findings:  height is 5\' 2"  (1.575 m) and weight is 151 lb 12.8 oz (68.856 kg). Her temperature is 98.4 F (36.9 C). Her blood pressure is 128/95 and her pulse is 100.  no ankle edema. No lymphedema in her arms. Symmetric tenderness to palpation in upper arms and lower legs  Impression:  The patient is tolerating radiotherapy.  Plan:  Continue radiotherapy as planned. If she develops swelling she understands that she should let us know. Discussed signs of DVTs. Suspicion for DVT at this time is low ________________________________   Lonie Peak, M.D.

## 2013-01-01 NOTE — Addendum Note (Signed)
Encounter addended by: Eduardo Osier, RN on: 01/01/2013 11:26 AM<BR>     Documentation filed: Orders

## 2013-01-01 NOTE — Addendum Note (Signed)
Encounter addended by: Eduardo Osier, RN on: 01/01/2013 11:50 AM<BR>     Documentation filed: Inpatient MAR

## 2013-01-01 NOTE — Progress Notes (Signed)
Sherry Barry here for weekly under treat visit.  She has had 1 fraction to her right breast.   She does have aching in her arms and legs in the morning that she is rating at a 10/10.  She thinks it is from chemotherapy.  She denies nausea and fatigue.  She was given the Radiation Therapy and you book and discussed the potential side effects of radiation including fatigue, hair loss and skin changes.  She was given alra deoderant and radiaplex gel and was instructed to apply it after treatment and at bedtime.  She was advised to contact nursing with any questions or concerns.

## 2013-01-02 ENCOUNTER — Ambulatory Visit
Admission: RE | Admit: 2013-01-02 | Discharge: 2013-01-02 | Disposition: A | Discharge status: Home / Self Care | Payer: BC Managed Care – PPO | Source: Ambulatory Visit | Attending: Radiation Oncology | Admitting: Radiation Oncology

## 2013-01-03 ENCOUNTER — Ambulatory Visit
Admission: RE | Admit: 2013-01-03 | Discharge: 2013-01-03 | Disposition: A | Discharge status: Home / Self Care | Payer: BC Managed Care – PPO | Source: Ambulatory Visit | Attending: Radiation Oncology | Admitting: Radiation Oncology

## 2013-01-04 ENCOUNTER — Ambulatory Visit
Admission: RE | Admit: 2013-01-04 | Discharge: 2013-01-04 | Disposition: A | Discharge status: Home / Self Care | Payer: BC Managed Care – PPO | Source: Ambulatory Visit | Attending: Radiation Oncology | Admitting: Radiation Oncology

## 2013-01-05 ENCOUNTER — Ambulatory Visit
Admission: RE | Admit: 2013-01-05 | Discharge: 2013-01-05 | Disposition: A | Discharge status: Home / Self Care | Payer: BC Managed Care – PPO | Source: Ambulatory Visit | Attending: Radiation Oncology | Admitting: Radiation Oncology

## 2013-01-08 ENCOUNTER — Ambulatory Visit
Admission: RE | Admit: 2013-01-08 | Discharge: 2013-01-08 | Disposition: A | Discharge status: Home / Self Care | Payer: BC Managed Care – PPO | Source: Ambulatory Visit | Attending: Radiation Oncology | Admitting: Radiation Oncology

## 2013-01-08 VITALS — BP 127/98 | HR 90 | Temp 98.6°F | Ht 62.0 in | Wt 152.6 lb

## 2013-01-08 DIAGNOSIS — C50211 Malignant neoplasm of upper-inner quadrant of right female breast: Secondary | ICD-10-CM

## 2013-01-08 NOTE — Progress Notes (Signed)
Lane [place] here for weekly under treat visit.  She has had 6 fractions to her right chest.  She denies pain and states that her fatigue is no worse than usual.  Her skin is intact on her right chest.  She is using radiaplex gel twice a day.

## 2013-01-08 NOTE — Progress Notes (Signed)
Weekly Management Note:  Site: Right chest wall/regional lymph nodes Current Dose:  1080  cGy Projected Dose: 5040 cGy  Narrative: The patient is seen today for routine under treatment assessment. CBCT/MVCT images/port films were reviewed. The chart was reviewed.   She is without complaints today. She uses Radioplex gel.  Physical Examination:  Filed Vitals:   01/08/13 1131  BP: 127/98  Pulse: 90  Temp: 98.6 F (37 C)  .  Weight: 152 lb 9.6 oz (69.219 kg). No significant skin changes.  Impression: Tolerating radiation therapy well.  Plan: Continue radiation therapy as planned.

## 2013-01-09 ENCOUNTER — Ambulatory Visit
Admission: RE | Admit: 2013-01-09 | Discharge: 2013-01-09 | Disposition: A | Discharge status: Home / Self Care | Payer: BC Managed Care – PPO | Source: Ambulatory Visit | Attending: Radiation Oncology | Admitting: Radiation Oncology

## 2013-01-10 ENCOUNTER — Ambulatory Visit
Admission: RE | Admit: 2013-01-10 | Discharge: 2013-01-10 | Disposition: A | Discharge status: Home / Self Care | Payer: BC Managed Care – PPO | Source: Ambulatory Visit | Attending: Radiation Oncology | Admitting: Radiation Oncology

## 2013-01-10 ENCOUNTER — Ambulatory Visit (HOSPITAL_BASED_OUTPATIENT_CLINIC_OR_DEPARTMENT_OTHER): Payer: BC Managed Care – PPO

## 2013-01-10 ENCOUNTER — Other Ambulatory Visit (HOSPITAL_BASED_OUTPATIENT_CLINIC_OR_DEPARTMENT_OTHER): Payer: BC Managed Care – PPO | Admitting: Lab

## 2013-01-10 DIAGNOSIS — C50219 Malignant neoplasm of upper-inner quadrant of unspecified female breast: Secondary | ICD-10-CM

## 2013-01-10 DIAGNOSIS — C50911 Malignant neoplasm of unspecified site of right female breast: Secondary | ICD-10-CM

## 2013-01-10 DIAGNOSIS — Z5112 Encounter for antineoplastic immunotherapy: Secondary | ICD-10-CM

## 2013-01-10 LAB — CBC WITH DIFFERENTIAL/PLATELET
Eosinophils Absolute: 0.2 10*3/uL (ref 0.0–0.5)
HCT: 37.6 % (ref 34.8–46.6)
LYMPH%: 31.7 % (ref 14.0–49.7)
MONO#: 0.3 10*3/uL (ref 0.1–0.9)
NEUT#: 2.1 10*3/uL (ref 1.5–6.5)
NEUT%: 53.8 % (ref 38.4–76.8)
Platelets: 173 10*3/uL (ref 145–400)
WBC: 3.9 10*3/uL (ref 3.9–10.3)
lymph#: 1.3 10*3/uL (ref 0.9–3.3)
nRBC: 0 % (ref 0–0)

## 2013-01-10 MED ORDER — HEPARIN SOD (PORK) LOCK FLUSH 100 UNIT/ML IV SOLN
500.0000 [IU] | Freq: Once | INTRAVENOUS | Status: AC | PRN
  Administered 2013-01-10: 500 [IU]
  Filled 2013-01-10: qty 5

## 2013-01-10 MED ORDER — SODIUM CHLORIDE 0.9 % IJ SOLN
10.0000 mL | INTRAMUSCULAR | Status: DC | PRN
  Administered 2013-01-10: 10 mL
  Filled 2013-01-10: qty 10

## 2013-01-10 MED ORDER — SODIUM CHLORIDE 0.9 % IJ SOLN
3.0000 mL | INTRAMUSCULAR | Status: DC | PRN
  Filled 2013-01-10: qty 10

## 2013-01-10 MED ORDER — DIPHENHYDRAMINE HCL 25 MG PO CAPS
50.0000 mg | ORAL_CAPSULE | Freq: Once | ORAL | Status: AC
  Administered 2013-01-10: 50 mg via ORAL

## 2013-01-10 MED ORDER — SODIUM CHLORIDE 0.9 % IV SOLN
Freq: Once | INTRAVENOUS | Status: AC
  Administered 2013-01-10: 09:00:00 via INTRAVENOUS

## 2013-01-10 MED ORDER — TRASTUZUMAB CHEMO INJECTION 440 MG
378.0000 mg | Freq: Once | INTRAVENOUS | Status: AC
  Administered 2013-01-10: 378 mg via INTRAVENOUS
  Filled 2013-01-10: qty 18

## 2013-01-10 MED ORDER — ACETAMINOPHEN 325 MG PO TABS
650.0000 mg | ORAL_TABLET | Freq: Once | ORAL | Status: AC
  Administered 2013-01-10: 650 mg via ORAL

## 2013-01-10 NOTE — Patient Instructions (Signed)

## 2013-01-11 ENCOUNTER — Ambulatory Visit
Admission: RE | Admit: 2013-01-11 | Discharge: 2013-01-11 | Disposition: A | Discharge status: Home / Self Care | Payer: BC Managed Care – PPO | Source: Ambulatory Visit | Attending: Radiation Oncology | Admitting: Radiation Oncology

## 2013-01-12 ENCOUNTER — Ambulatory Visit
Admission: RE | Admit: 2013-01-12 | Discharge: 2013-01-12 | Disposition: A | Discharge status: Home / Self Care | Payer: BC Managed Care – PPO | Source: Ambulatory Visit | Attending: Radiation Oncology | Admitting: Radiation Oncology

## 2013-01-15 ENCOUNTER — Ambulatory Visit
Admission: RE | Admit: 2013-01-15 | Discharge: 2013-01-15 | Disposition: A | Discharge status: Home / Self Care | Payer: BC Managed Care – PPO | Source: Ambulatory Visit | Attending: Radiation Oncology | Admitting: Radiation Oncology

## 2013-01-15 ENCOUNTER — Encounter: Payer: Self-pay | Admitting: Radiation Oncology

## 2013-01-15 VITALS — BP 130/82 | HR 98 | Temp 98.2°F | Resp 20 | Wt 152.6 lb

## 2013-01-15 DIAGNOSIS — C50211 Malignant neoplasm of upper-inner quadrant of right female breast: Secondary | ICD-10-CM

## 2013-01-15 NOTE — Progress Notes (Signed)
Weekly Management Note:  Site: Right chest wall/regional lymph nodes Current Dose:  1980  cGy Projected Dose: 5040  cGy followed by chest wall boost  Narrative: The patient is seen today for routine under treatment assessment. CBCT/MVCT images/port films were reviewed. The chart was reviewed.   She uses Radioplex gel. No complaints today.  Physical Examination:  Filed Vitals:   01/15/13 1106  BP: 130/82  Pulse: 98  Temp: 98.2 F (36.8 C)  Resp: 20  .  Weight: 152 lb 9.6 oz (69.219 kg). There is slight hyperpigmentation the skin along the right chest wall and nodal regions. No areas of desquamation.  Impression: Tolerating radiation therapy well.  Plan: Continue radiation therapy as planned.

## 2013-01-15 NOTE — Progress Notes (Signed)
Weekly rad txs 11/33 completed RCW, hyperpigmentation, skin intact, , no c/o pain, appetite good, no fatigue, using radiaplex gel bid No pain 11:07 AM

## 2013-01-16 ENCOUNTER — Ambulatory Visit
Admission: RE | Admit: 2013-01-16 | Discharge: 2013-01-16 | Disposition: A | Discharge status: Home / Self Care | Payer: BC Managed Care – PPO | Source: Ambulatory Visit | Attending: Radiation Oncology | Admitting: Radiation Oncology

## 2013-01-17 ENCOUNTER — Ambulatory Visit
Admission: RE | Admit: 2013-01-17 | Discharge: 2013-01-17 | Disposition: A | Discharge status: Home / Self Care | Payer: BC Managed Care – PPO | Source: Ambulatory Visit | Attending: Radiation Oncology | Admitting: Radiation Oncology

## 2013-01-18 ENCOUNTER — Ambulatory Visit
Admission: RE | Admit: 2013-01-18 | Discharge: 2013-01-18 | Disposition: A | Discharge status: Home / Self Care | Payer: BC Managed Care – PPO | Source: Ambulatory Visit | Attending: Radiation Oncology | Admitting: Radiation Oncology

## 2013-01-19 ENCOUNTER — Ambulatory Visit
Admission: RE | Admit: 2013-01-19 | Discharge: 2013-01-19 | Disposition: A | Discharge status: Home / Self Care | Payer: BC Managed Care – PPO | Source: Ambulatory Visit | Attending: Radiation Oncology | Admitting: Radiation Oncology

## 2013-01-22 ENCOUNTER — Ambulatory Visit
Admission: RE | Admit: 2013-01-22 | Discharge: 2013-01-22 | Disposition: A | Discharge status: Home / Self Care | Payer: BC Managed Care – PPO | Source: Ambulatory Visit | Attending: Radiation Oncology | Admitting: Radiation Oncology

## 2013-01-22 ENCOUNTER — Encounter: Payer: Self-pay | Admitting: Radiation Oncology

## 2013-01-22 VITALS — BP 124/98 | HR 87 | Resp 16 | Wt 150.8 lb

## 2013-01-22 DIAGNOSIS — C50211 Malignant neoplasm of upper-inner quadrant of right female breast: Secondary | ICD-10-CM

## 2013-01-22 NOTE — Progress Notes (Signed)
Reports only faint hyperpigmentation right/treated chest wall. Reports using radiaplex bid as directed. Denies fatigue. Blood pressure elevated. Patient denies feeling lightheaded or dizzy. Reports drinking coffee this morning without eating much therefore she feels a little jittery. Encouraged patient to check bp later today and she verbalized understanding. Encouraged her to contact pcp if it continues to run high.

## 2013-01-22 NOTE — Progress Notes (Signed)
   Weekly Management Note: Outpatient Current Dose:  28.8Gy  Projected Dose: 50.4 Gy  + boost  Narrative:  The patient presents for routine under treatment assessment.  CBCT/MVCT images/Port film x-rays were reviewed.  The chart was checked. She is doing relatively well. No fatigue  Physical Findings:  weight is 150 lb 12.8 oz (68.402 kg). Her blood pressure is 124/98 and her pulse is 87. Her respiration is 16.  Skin moderately hyperpigmented and intact over right chest wall  Impression:  The patient is tolerating radiotherapy.  Plan:  Continue radiotherapy as planned.   ________________________________   Lonie Peak, M.D.

## 2013-01-23 ENCOUNTER — Ambulatory Visit
Admission: RE | Admit: 2013-01-23 | Discharge: 2013-01-23 | Disposition: A | Discharge status: Home / Self Care | Payer: BC Managed Care – PPO | Source: Ambulatory Visit | Attending: Radiation Oncology | Admitting: Radiation Oncology

## 2013-01-24 ENCOUNTER — Ambulatory Visit
Admission: RE | Admit: 2013-01-24 | Discharge: 2013-01-24 | Disposition: A | Discharge status: Home / Self Care | Payer: BC Managed Care – PPO | Source: Ambulatory Visit | Attending: Radiation Oncology | Admitting: Radiation Oncology

## 2013-01-25 ENCOUNTER — Ambulatory Visit
Admission: RE | Admit: 2013-01-25 | Discharge: 2013-01-25 | Disposition: A | Discharge status: Home / Self Care | Payer: BC Managed Care – PPO | Source: Ambulatory Visit | Attending: Radiation Oncology | Admitting: Radiation Oncology

## 2013-01-26 ENCOUNTER — Ambulatory Visit
Admission: RE | Admit: 2013-01-26 | Discharge: 2013-01-26 | Disposition: A | Discharge status: Home / Self Care | Payer: BC Managed Care – PPO | Source: Ambulatory Visit | Attending: Radiation Oncology | Admitting: Radiation Oncology

## 2013-01-29 ENCOUNTER — Ambulatory Visit
Admission: RE | Admit: 2013-01-29 | Discharge: 2013-01-29 | Disposition: A | Discharge status: Home / Self Care | Payer: BC Managed Care – PPO | Source: Ambulatory Visit | Attending: Radiation Oncology | Admitting: Radiation Oncology

## 2013-01-29 VITALS — BP 147/104 | HR 83 | Temp 97.8°F | Ht 62.0 in | Wt 149.6 lb

## 2013-01-29 DIAGNOSIS — C50211 Malignant neoplasm of upper-inner quadrant of right female breast: Secondary | ICD-10-CM

## 2013-01-29 NOTE — Progress Notes (Signed)
Weekly Management Note:  Site: Right chest wall/regional lymph nodes Current Dose:  3780  cGy Projected Dose:  5040  cGy followed by right chest wall boost  Narrative: The patient is seen today for routine under treatment assessment. CBCT/MVCT images/port films were reviewed. The chart was reviewed.   She still doing well and is without complaints today. She uses Radioplex gel. She appears to have been recently borderline hypertensive.  Physical Examination:  Filed Vitals:   01/29/13 1133  BP: 147/104  Pulse: 83  Temp:   .  Weight: 149 lb 9.6 oz (67.858 kg). There is hyperpigmentation the skin along the right chest wall but no significant areas of desquamation.  Impression: Tolerating radiation therapy well. We'll need to monitor her blood pressure and get her seen by primary care should she remain hypertensive.  Plan: Continue radiation therapy as planned.

## 2013-01-29 NOTE — Progress Notes (Signed)
Sherry Barry here for weekly under treat visit.  She has had 21 fractions to her right chest wall.  She denies pain.  She denies fatigue.  The skin on her right chest has hyperpigmentation.  There is some redness at the bottom of the treatment area.  She reports that she has had a few little scabs there.  She is using radiaplex daily.  Her bp today was 147/104 and 142/106.

## 2013-01-30 ENCOUNTER — Telehealth (HOSPITAL_COMMUNITY): Payer: Self-pay | Admitting: Cardiology

## 2013-01-30 ENCOUNTER — Ambulatory Visit
Admission: RE | Admit: 2013-01-30 | Discharge: 2013-01-30 | Disposition: A | Discharge status: Home / Self Care | Payer: BC Managed Care – PPO | Source: Ambulatory Visit | Attending: Radiation Oncology | Admitting: Radiation Oncology

## 2013-01-30 DIAGNOSIS — C50919 Malignant neoplasm of unspecified site of unspecified female breast: Secondary | ICD-10-CM

## 2013-01-30 NOTE — Telephone Encounter (Signed)
ORDER PLACED FOR UPCOMING ECHO 

## 2013-01-31 ENCOUNTER — Ambulatory Visit
Admission: RE | Admit: 2013-01-31 | Discharge: 2013-01-31 | Disposition: A | Discharge status: Home / Self Care | Payer: BC Managed Care – PPO | Source: Ambulatory Visit | Attending: Radiation Oncology | Admitting: Radiation Oncology

## 2013-02-01 ENCOUNTER — Encounter: Payer: Self-pay | Admitting: Physician Assistant

## 2013-02-01 ENCOUNTER — Ambulatory Visit (HOSPITAL_BASED_OUTPATIENT_CLINIC_OR_DEPARTMENT_OTHER): Payer: BC Managed Care – PPO

## 2013-02-01 ENCOUNTER — Ambulatory Visit
Admission: RE | Admit: 2013-02-01 | Discharge: 2013-02-01 | Disposition: A | Discharge status: Home / Self Care | Payer: BC Managed Care – PPO | Source: Ambulatory Visit | Attending: Radiation Oncology | Admitting: Radiation Oncology

## 2013-02-01 ENCOUNTER — Telehealth: Payer: Self-pay | Admitting: *Deleted

## 2013-02-01 ENCOUNTER — Telehealth: Payer: Self-pay | Admitting: Oncology

## 2013-02-01 ENCOUNTER — Other Ambulatory Visit (HOSPITAL_BASED_OUTPATIENT_CLINIC_OR_DEPARTMENT_OTHER): Payer: BC Managed Care – PPO | Admitting: Lab

## 2013-02-01 ENCOUNTER — Ambulatory Visit (HOSPITAL_BASED_OUTPATIENT_CLINIC_OR_DEPARTMENT_OTHER): Payer: BC Managed Care – PPO | Admitting: Physician Assistant

## 2013-02-01 VITALS — BP 133/91 | HR 101 | Temp 98.9°F | Resp 18 | Ht 62.0 in | Wt 148.4 lb

## 2013-02-01 DIAGNOSIS — Z170 Estrogen receptor positive status [ER+]: Secondary | ICD-10-CM

## 2013-02-01 DIAGNOSIS — C50219 Malignant neoplasm of upper-inner quadrant of unspecified female breast: Secondary | ICD-10-CM

## 2013-02-01 DIAGNOSIS — C50211 Malignant neoplasm of upper-inner quadrant of right female breast: Secondary | ICD-10-CM

## 2013-02-01 DIAGNOSIS — Z5112 Encounter for antineoplastic immunotherapy: Secondary | ICD-10-CM

## 2013-02-01 LAB — CBC WITH DIFFERENTIAL/PLATELET
BASO%: 0.3 % (ref 0.0–2.0)
Basophils Absolute: 0 10*3/uL (ref 0.0–0.1)
EOS%: 3.7 % (ref 0.0–7.0)
HGB: 13.9 g/dL (ref 11.6–15.9)
MCH: 30.6 pg (ref 25.1–34.0)
RBC: 4.54 10*6/uL (ref 3.70–5.45)
RDW: 11.9 % (ref 11.2–14.5)
lymph#: 0.8 10*3/uL — ABNORMAL LOW (ref 0.9–3.3)
nRBC: 0 % (ref 0–0)

## 2013-02-01 LAB — COMPREHENSIVE METABOLIC PANEL (CC13)
ALT: 14 U/L (ref 0–55)
AST: 16 U/L (ref 5–34)
Albumin: 3.6 g/dL (ref 3.5–5.0)
Alkaline Phosphatase: 61 U/L (ref 40–150)
Glucose: 89 mg/dl (ref 70–140)
Potassium: 3.8 mEq/L (ref 3.5–5.1)
Sodium: 143 mEq/L (ref 136–145)
Total Protein: 7.1 g/dL (ref 6.4–8.3)

## 2013-02-01 MED ORDER — SODIUM CHLORIDE 0.9 % IV SOLN
Freq: Once | INTRAVENOUS | Status: AC
  Administered 2013-02-01: 10:00:00 via INTRAVENOUS

## 2013-02-01 MED ORDER — DIPHENHYDRAMINE HCL 25 MG PO CAPS
50.0000 mg | ORAL_CAPSULE | Freq: Once | ORAL | Status: AC
  Administered 2013-02-01: 50 mg via ORAL

## 2013-02-01 MED ORDER — ACETAMINOPHEN 325 MG PO TABS
650.0000 mg | ORAL_TABLET | Freq: Once | ORAL | Status: AC
  Administered 2013-02-01: 650 mg via ORAL

## 2013-02-01 MED ORDER — TAMOXIFEN CITRATE 20 MG PO TABS: 20.0000 mg | ORAL_TABLET | Freq: Every day | ORAL | Status: DC

## 2013-02-01 MED ORDER — SODIUM CHLORIDE 0.9 % IJ SOLN
10.0000 mL | INTRAMUSCULAR | Status: DC | PRN
  Administered 2013-02-01: 10 mL
  Filled 2013-02-01: qty 10

## 2013-02-01 MED ORDER — HEPARIN SOD (PORK) LOCK FLUSH 100 UNIT/ML IV SOLN
500.0000 [IU] | Freq: Once | INTRAVENOUS | Status: AC | PRN
  Administered 2013-02-01: 500 [IU]
  Filled 2013-02-01: qty 5

## 2013-02-01 MED ORDER — TRASTUZUMAB CHEMO INJECTION 440 MG
378.0000 mg | Freq: Once | INTRAVENOUS | Status: AC
  Administered 2013-02-01: 378 mg via INTRAVENOUS
  Filled 2013-02-01: qty 18

## 2013-02-01 NOTE — Telephone Encounter (Signed)
Per staff message and POF I have scheduled appts.  JMW  

## 2013-02-01 NOTE — Progress Notes (Signed)
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 At that time a digital bilateal diagnostic mammogram showed a slight decrease in size of the benign fibroadenoma in the left breast compared to 2010. The same mammogram, however, also refilled pleomorphic calcifications extending over 9 cm in the inner third of the right breast. There were no suspicious calcifications in the left breast. On exam, Dr. Lyman Bishop was able to palpate a small freely mobile mass in the lower inner left breast, but was unable to palpate any abnormalities in the right breast other than some focal thickening.  An subsequent ultrasound of the right breast on 06/16/2012 showed multiple hypoechoic masses throughout the upper quadrants of the right breast. The palpable mass in the 1:00 location corresponded with an irregular hypoechoic mass measuring 1.7 x 1.4 cm. There was also a discrete hypoechoic lobulated mass at the 11:00 position measuring 2.1 x 1.1 x 2.1 cm. The third lesion identified at the 11:00 location, 3 cm from the nipple, measured 1.0 x 0.8 x 0.9 cm.   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  Bilateral breast MRI 06/23/2012 showed an enhancing mass in the upper central right breast measuring 1.8 cm, together with clumped nodular enhancement extending for a total area of 11.4 cm. In the upper outer quadrant of this breast there were 3 discrete  irregular masses measuring up to 3 cm. This correlated well with the ultrasound findings previously. In the left breast there was a large area of clumped nodular enhancement measuring up to 8.9 cm. In the lower central portion there was a 1.7 cm circumscribed nodule consistent with a known fibroadenoma. There weas no axillary or internal mammary adenopathy noted on either side.   The 8.9 cm area of abnormality in the left breast was biopsied on 06/30/2012 showing fibrocystic changes with adenosis and calcifications, pseudoangiomatous stromal hyperplasia, but no evidence of malignancy.  The patient underwent bilateral mastectomies under the care of Dr. Luisa Hart on 07/14/2012. The left mastectomy revealed benign breast tissue and was negative for malignancy. 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 Lymphovascular invasion was identified; #2) a ductal carcinoma in situ, grade 3, with comedo necrosis.  Surgical margins were negative. 4 lymph nodes were examined, one positive for metastatic mammary carcinoma, a second positive for isolated tumor cells. pT2, pN1a  Subsequent history is detailed below.  INTERVAL HISTORY: Sherry Barry returns today for followup of her right breast cancer. Since her last appointment here in late June, she has continued to receive trastuzumab every 3 weeks with good tolerance. She's also receiving active radiation therapy under the care of Dr. Dayton Scrape with good tolerance. She is due for her next dose of trastuzumab today.  Sherry Barry is feeling well and is "almost back to normal". She is able to hesitate and almost all of her normal  day-to-day activities, and is back to work as well.  REVIEW OF SYSTEMS: Sherry Barry denies any recent illnesses and has had no fevers or chills. She continues to have hot flashes. She tried taking gabapentin but tells me it made her "feels funny" and she  discontinued the drug. At this point she is just tolerating the hot flashes. She's had no skin changes other than those associated with her radiation therapy. She denies any bruising or abnormal bleeding. Her appetite is good, and she denies any nausea or change in bowel or bladder habits. She's had no cough, shortness of breath, orthopnea, chest pain, palpitations, or peripheral swelling. She's had no abnormal headaches or dizziness. She had some pain in the lower tremor these following chemotherapy, but this has resolved, and currently she denies any unusual myalgias, arthralgias, or bony pain. She has no problems with peripheral neuropathy.  A detailed review of systems is otherwise stable and noncontributory.   PAST MEDICAL HISTORY: Past Medical History  Diagnosis Date  . Allergy   . Migraine   . Migraine   . History of migraine     last one about a week ago  . History of chemotherapy     doxetaxel/carboplatin/trastuzumab  . Breast cancer   . Breast cancer     right  . Hypertension     PAST SURGICAL HISTORY: Past Surgical History  Procedure Laterality Date  . Simple mastectomy with axillary sentinel node biopsy  07/14/2012    Procedure: SIMPLE MASTECTOMY WITH AXILLARY SENTINEL NODE BIOPSY;  Surgeon: Clovis Pu. Cornett, MD;  Location: [facility] OR;  Service: General;  Laterality: Right;  Bilateral simple mastectomy with port and right sebtibel lymph node mapping  . Simple mastectomy with axillary sentinel node biopsy  07/14/2012    Procedure: SIMPLE MASTECTOMY;  Surgeon: Clovis Pu. Cornett, MD;  Location: [facility] OR;  Service: General;  Laterality: Left;  . Portacath placement  07/14/2012    Procedure: INSERTION PORT-A-CATH;  Surgeon: Clovis Pu. Cornett, MD;  Location: [facility] OR;  Service: General;  Laterality: Left;  . Abdominal hysterectomy      no salpingo-oophorectomy    FAMILY HISTORY Family History  Problem Relation Age of Onset  . Hypertension Mother   . Alcohol abuse Mother   . Heart  disease Maternal Grandmother   . Stroke Maternal Grandfather   . Colon cancer Maternal Aunt 55    alive at 51  . Brain cancer Maternal Uncle 73    and lymphoma in early 10s; deceased  . Brain cancer Maternal Uncle 60    deceased  . Pancreatic cancer Maternal Uncle 45    alive at 33  . Breast cancer Maternal Aunt     great aunt through mat GF; dx at ? age  . Breast cancer Cousin 65    mat 1st cousin once removed through mat GF ; deceased   the patient's father died in an automobile accident in his early 34s. The patient's mother is living, currently age 80. The patient has one brother and one sister. There is no family history of breast or ovarian cancer, although there are maternal uncles with brain, colon and lymph node cancers.  GYNECOLOGIC HISTORY: Menarche age 75, first live birth age 59, she is GX P2. She stopped having periods with her surgery March of 2009. She does have "premenstrual symptoms" on a regular basis.  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 Her husband Sherry Barry works for a company in the  [place] doing testing. Their children Sherry Barry (9) and Sherry Barry (4) at home.   ADVANCED DIRECTIVES: not in place  HEALTH MAINTENANCE: History  Substance Use Topics  . Smoking status: Never Smoker   . Smokeless tobacco: Never Used  . Alcohol Use: Yes     Comment: occasional     Colonoscopy: Never  PAP: 09/2010  Bone density: Never  Lipid panel:  Allergies  Allergen Reactions  . Codeine Itching  . Latex Swelling    Current Outpatient Prescriptions  Medication Sig Dispense Refill  . hyaluronate sodium (RADIAPLEXRX) GEL Apply topically 2 (two) times daily.      [name] lidocaine-prilocaine (EMLA) cream Apply topically as needed. Apply to port 1-2 hours before chemo and cover with plastic wrap  30 g  0  . mometasone (NASONEX) 50 MCG/ACT nasal spray Place 2 sprays into the nose daily.      . non-metallic  deodorant Thornton Papas) MISC Apply 1 application topically daily as needed.      . gabapentin (NEURONTIN) 300 MG capsule Take 300 mg by mouth 3 (three) times daily.       No current facility-administered medications for this visit.    OBJECTIVE: Young-appearing African American woman in no acute distress Filed Vitals:   02/01/13 0847  BP: 133/91  Pulse: 101  Temp: 98.9 F (37.2 C)  Resp: 18     Body mass index is 27.14 kg/(m^2).    ECOG FS: 0 Filed Weights   02/01/13 0847  Weight: 148 lb 6.4 oz (67.314 kg)   Sclerae unicteric Oropharynx clear No cervical or supraclavicular adenopathy Lungs clear to auscultation, no wheezes or rhonchi Heart regular rate and rhythm, and no murmur appreciated Abdomen soft, nontender, positive bowel sounds MSK no focal spinal tenderness to palpation No peripheral edema Neuro: nonfocal. Well oriented, friendly affect. Breasts: She is status post bilateral mastectomies. Axillae are benign, no palpable adenopathy. Port is intact in the left upper chest wall, no erythema or edema noted   LAB RESULTS: Lab Results  Component Value Date   WBC 3.2* 02/01/2013   NEUTROABS 2.1 02/01/2013   HGB 13.9 02/01/2013   HCT 40.6 02/01/2013   MCV 89.4 02/01/2013   PLT 137* 02/01/2013      Chemistry      Component Value Date/Time   NA 141 12/07/2012 0921   NA 135 07/15/2012 0500   K 3.5 12/07/2012 0921   K 3.9 07/15/2012 0500   CL 101 11/15/2012 0904   CL 103 07/15/2012 0500   CO2 26 12/07/2012 0921   CO2 25 07/15/2012 0500   BUN 9.1 12/07/2012 0921   BUN 7 07/15/2012 0500   CREATININE 0.8 12/07/2012 0921   CREATININE 0.62 07/15/2012 0500   CREATININE 0.80 05/02/2012 1412      Component Value Date/Time   CALCIUM 9.2 12/07/2012 0921   CALCIUM 8.2* 07/15/2012 0500   ALKPHOS 73 12/07/2012 0921   ALKPHOS 46 07/14/2012 2300   AST 15 12/07/2012 0921   AST 14 07/14/2012 2300   ALT 19 12/07/2012 0921   ALT 7 07/14/2012 2300   BILITOT 0.39 12/07/2012 0921   BILITOT 0.3 07/14/2012 2300        Lab Results  Component Value Date   LABCA2 41* 06/28/2012     STUDIES:  Echocardiogram 10/30/2012 showed an ejection fraction of 60 %. Echo scheduled to be repeated on may 2714.     ASSESSMENT: 42 y.o. [place] woman status post right mastectomy and sentinel lymph node sampling  07/14/2012 (with simple left mastectomy)  for a pT2, pN1, stage IIB invasive ductal carcinoma, grade 3, estrogen receptor 100% and progesterone receptor 100% positive, with an MIB-1 of 48%, and HER-2 amplified by CISH with a ratio of 3.02.  (1) adjuvant chemotherapy started 08/17/2012, consisting of carboplatin, docetaxel and trastuzumab given every 3 weeks for 6 cycles, completed 11/30/2012  (2) trastuzumab to be continued to March of 2015. Most recent echocardiogram 10/30/2012  (3)  Currently receiving radiation therapy, scheduled to be completed in early September after which the patient will start on tamoxifen.     PLAN:   Kamyiah is doing very well, is tolerating radiation well, and is also tolerating trastuzumab well. She'll receive her next q. three-week dose of trastuzumab today as scheduled, and will continue through March of 2015. She'll have echocardiograms every 3 months as previously discussed, in the next is scheduled for August 27.  Over half of our 40 minute appointment today was spent counseling the patient with regards to her upcoming followup, discussing treatment with tamoxifen, and coordinating care. We reviewed all of the benefits as well as of the possible side effects associated with tamoxifen, and she was given all of this information in writing today. The plan is to start tamoxifen in mid-September, after she completes radiation therapy.  Lachanda will continue to be treated here every 3 weeks, and I will plan on seeing her back for followup and assessment of tolerance of the tamoxifen in late October. She knows, however, to call meanwhile if any changes or problems.    Osualdo Hansell     02/01/2013

## 2013-02-01 NOTE — Patient Instructions (Signed)
[facility] Discharge Instructions for Patients Receiving Chemotherapy  Today you received the following chemotherapy agents: herceptin  To help prevent nausea and vomiting after your treatment, we encourage you to take your nausea medication.  Take it as often as prescribed.     If you develop nausea and vomiting that is not controlled by your nausea medication, call the clinic. If it is after clinic hours your family physician or the after hours number for the clinic or go to the Emergency Department.   BELOW ARE SYMPTOMS THAT SHOULD BE REPORTED IMMEDIATELY:  *FEVER GREATER THAN 100.5 F  *CHILLS WITH OR WITHOUT FEVER  NAUSEA AND VOMITING THAT IS NOT CONTROLLED WITH YOUR NAUSEA MEDICATION  *UNUSUAL SHORTNESS OF BREATH  *UNUSUAL BRUISING OR BLEEDING  TENDERNESS IN MOUTH AND THROAT WITH OR WITHOUT PRESENCE OF ULCERS  *URINARY PROBLEMS  *BOWEL PROBLEMS  UNUSUAL RASH Items with * indicate a potential emergency and should be followed up as soon as possible.  Feel free to call the clinic you have any questions or concerns. The clinic phone number is [phone].   I have been informed and understand all the instructions given to me. I know to contact the clinic, my physician, or go to the Emergency Department if any problems should occur. I do not have any questions at this time, but understand that I may call the clinic during office hours   should I have any questions or need assistance in obtaining follow up care.    __________________________________________  _____________  __________ Signature of Patient or Authorized Representative            Date                   Time    __________________________________________ Nurse's Signature    

## 2013-02-02 ENCOUNTER — Ambulatory Visit
Admission: RE | Admit: 2013-02-02 | Discharge: 2013-02-02 | Disposition: A | Discharge status: Home / Self Care | Payer: BC Managed Care – PPO | Source: Ambulatory Visit | Attending: Radiation Oncology | Admitting: Radiation Oncology

## 2013-02-05 ENCOUNTER — Ambulatory Visit: Payer: BC Managed Care – PPO | Admitting: Radiation Oncology

## 2013-02-05 ENCOUNTER — Encounter: Payer: Self-pay | Admitting: Oncology

## 2013-02-05 ENCOUNTER — Ambulatory Visit
Admission: RE | Admit: 2013-02-05 | Discharge: 2013-02-05 | Disposition: A | Discharge status: Home / Self Care | Payer: BC Managed Care – PPO | Source: Ambulatory Visit | Attending: Radiation Oncology | Admitting: Radiation Oncology

## 2013-02-06 ENCOUNTER — Ambulatory Visit
Admission: RE | Admit: 2013-02-06 | Discharge: 2013-02-06 | Disposition: A | Discharge status: Home / Self Care | Payer: BC Managed Care – PPO | Source: Ambulatory Visit | Attending: Radiation Oncology | Admitting: Radiation Oncology

## 2013-02-06 VITALS — BP 136/97 | HR 91 | Temp 98.4°F | Ht 62.0 in | Wt 147.6 lb

## 2013-02-06 DIAGNOSIS — C50211 Malignant neoplasm of upper-inner quadrant of right female breast: Secondary | ICD-10-CM

## 2013-02-06 NOTE — Progress Notes (Signed)
Weekly Management Note:  Site: Right chest wall/regional lymph nodes Current Dose:  4860  cGy Projected Dose: 5040  cGy followed by right chest wall boost  Narrative: The patient is seen today for routine under treatment assessment. CBCT/MVCT images/port films were reviewed. The chart was reviewed.   No new complaints today. She uses Radioplex gel twice a day. Electron beam mark out was performed today. Physical Examination:  Filed Vitals:   02/06/13 1106  BP: 136/97  Pulse: 91  Temp: 98.4 F (36.9 C)  .  Weight: 147 lb 9.6 oz (66.951 kg). There is marked hyperpigmentation the skin along the right chest wall and nodal regions. There is patchy dry desquamation. No areas of moist desquamation.  Impression: Tolerating radiation therapy well.  Plan: Continue radiation therapy as planned.

## 2013-02-06 NOTE — Progress Notes (Signed)
Complex simulation/electron beam note: On the [facility] treatment table I outlined her right mastectomy scar. One custom block is constructed to conform the field. A special port plan is requested. I prescribing 1000 cGy in 5 sessions utilizing 6 MEV electrons. 0.8 cm custom bolus we constructed in apply to the skin on her first day of treatment.

## 2013-02-06 NOTE — Progress Notes (Signed)
Sherry Barry here for weekly under treat visit.  She has had 27 fractions to her right chest wall.  She denies pain.  She does have fatigue.  The skin on her right chest has hyperpigmentaion.  There has been a small amount of peeling on the bottom of the treatment area.  She also reports soreness under her right arm.  She has been using radiaplex gel twice a day.

## 2013-02-07 ENCOUNTER — Ambulatory Visit (HOSPITAL_BASED_OUTPATIENT_CLINIC_OR_DEPARTMENT_OTHER)
Admission: RE | Admit: 2013-02-07 | Discharge: 2013-02-07 | Disposition: A | Discharge status: Home / Self Care | Payer: BC Managed Care – PPO | Source: Ambulatory Visit | Attending: Internal Medicine | Admitting: Internal Medicine

## 2013-02-07 ENCOUNTER — Ambulatory Visit
Admission: RE | Admit: 2013-02-07 | Discharge: 2013-02-07 | Disposition: A | Discharge status: Home / Self Care | Payer: BC Managed Care – PPO | Source: Ambulatory Visit | Attending: Radiation Oncology | Admitting: Radiation Oncology

## 2013-02-07 ENCOUNTER — Ambulatory Visit (HOSPITAL_COMMUNITY)
Admission: RE | Admit: 2013-02-07 | Discharge: 2013-02-07 | Disposition: A | Discharge status: Home / Self Care | Payer: BC Managed Care – PPO | Source: Ambulatory Visit | Attending: Internal Medicine | Admitting: Internal Medicine

## 2013-02-07 VITALS — HR 88 | Wt 148.0 lb

## 2013-02-07 DIAGNOSIS — Z136 Encounter for screening for cardiovascular disorders: Secondary | ICD-10-CM | POA: Insufficient documentation

## 2013-02-07 DIAGNOSIS — C50919 Malignant neoplasm of unspecified site of unspecified female breast: Secondary | ICD-10-CM | POA: Insufficient documentation

## 2013-02-07 DIAGNOSIS — Z5111 Encounter for antineoplastic chemotherapy: Secondary | ICD-10-CM

## 2013-02-07 DIAGNOSIS — I079 Rheumatic tricuspid valve disease, unspecified: Secondary | ICD-10-CM | POA: Insufficient documentation

## 2013-02-07 DIAGNOSIS — C50219 Malignant neoplasm of upper-inner quadrant of unspecified female breast: Secondary | ICD-10-CM

## 2013-02-07 DIAGNOSIS — C50211 Malignant neoplasm of upper-inner quadrant of right female breast: Secondary | ICD-10-CM

## 2013-02-07 NOTE — Progress Notes (Signed)
Echocardiogram 2D Echocardiogram has been performed.  Dorothey Baseman 02/07/2013, 2:33 PM

## 2013-02-07 NOTE — Patient Instructions (Addendum)
Follow up in 3 months with an ECHO 

## 2013-02-07 NOTE — Progress Notes (Signed)
Patient ID: Sherry Barry, female   DOB: [birth year], 42 y.o.   MRN: [id number] Referring Physician: Primary Care: Primary Cardiologist:  HPI: Sherry Barry is a 42 y.o. 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  She is s/p bilateral mastectomy 07/14/12.  She is followed by Dr. Darnelle Catalan was started on adjuvant chemotherapy on 08/17/12 consisting of carboplatin, docetaxel and trastuzumab given every 3 weeks for 6 cycles.  She will then continue trastuzumab for 1 year.    Echo: 08/01/12: EF 60-65% Lat s' 11.4 10/30/12: EF 60-65% lat s' 11.7 02/07/13 EF 60% lat s' 12.0  She returns for follow up today.  She had been taking lisinopril she has since stopped due to hypotension.  She will complete radiation next week. Plan to complete Herceptin in March 2015.   Denies SOB/PND/Orthopnea  Review of Systems:   Past Medical History  Diagnosis Date  . Allergy   . Migraine   . Migraine   . History of migraine     last one about a week ago  . History of chemotherapy     doxetaxel/carboplatin/trastuzumab  . Breast cancer   . Breast cancer     right  . Hypertension     Current Outpatient Prescriptions  Medication Sig Dispense Refill  . hyaluronate sodium (RADIAPLEXRX) GEL Apply topically 2 (two) times daily.      [name] lidocaine-prilocaine (EMLA) cream Apply topically as needed. Apply to port 1-2 hours before chemo and cover with plastic wrap  30 g  0  . mometasone (NASONEX) 50 MCG/ACT nasal spray Place 2 sprays into the nose daily.      . non-metallic deodorant Thornton Papas) MISC Apply 1 application topically daily as needed.      . gabapentin (NEURONTIN) 300 MG capsule Take 300 mg by mouth 3 (three) times daily.      Melene Muller ON 02/16/2013] tamoxifen (NOLVADEX) 20 MG tablet Take 20 mg by mouth daily.       No current facility-administered medications for this encounter.   Allergies  Allergen Reactions  . Codeine Itching  . Latex Swelling     PHYSICAL EXAM: Filed Vitals:   02/07/13 1401  Pulse: 88  Weight: 148 lb (67.132 kg)  SpO2: 95%     General:  Well appearing. No respiratory difficulty HEENT: normal Neck: supple. no JVD. Carotids 2+ bilat; no bruits. No lymphadenopathy or thryomegaly appreciated. Cor: PMI nondisplaced. Regular rate & rhythm. No rubs, gallops or murmurs. L uuper chest scar  Lungs: clear Abdomen: soft, nontender, nondistended. No hepatosplenomegaly. No bruits or masses. Good bowel sounds. Extremities: no cyanosis, clubbing, rash, edema Neuro: alert & oriented x 3, cranial nerves grossly intact. moves all 4 extremities w/o difficulty. Affect pleasant.     ASSESSMENT & PLAN:  1. R breast cancer  On Herceptin. Completion target 08/2013 Dr Gala Romney reivewed and discussed ECHO. EF and lat s' stable   Follow up in 3 months with an ECHO  CLEGG,AMY NP-C 3:18 PM  Patient seen and examined with Sherry Becket, NP. We discussed all aspects of the encounter. I agree with the assessment and plan as stated above.   I reviewed echos personally. EF and Doppler parameters stable. No HF on exam. Continue Herceptin.   Daniel Bensimhon,MD 3:16 PM

## 2013-02-08 ENCOUNTER — Ambulatory Visit
Admission: RE | Admit: 2013-02-08 | Discharge: 2013-02-08 | Disposition: A | Discharge status: Home / Self Care | Payer: BC Managed Care – PPO | Source: Ambulatory Visit | Attending: Radiation Oncology | Admitting: Radiation Oncology

## 2013-02-09 ENCOUNTER — Ambulatory Visit
Admission: RE | Admit: 2013-02-09 | Discharge: 2013-02-09 | Disposition: A | Discharge status: Home / Self Care | Payer: BC Managed Care – PPO | Source: Ambulatory Visit | Attending: Radiation Oncology | Admitting: Radiation Oncology

## 2013-02-13 ENCOUNTER — Ambulatory Visit
Admission: RE | Admit: 2013-02-13 | Discharge: 2013-02-13 | Disposition: A | Discharge status: Home / Self Care | Payer: BC Managed Care – PPO | Source: Ambulatory Visit | Attending: Radiation Oncology | Admitting: Radiation Oncology

## 2013-02-13 VITALS — BP 129/94 | HR 97 | Temp 98.0°F | Ht 62.0 in | Wt 148.9 lb

## 2013-02-13 DIAGNOSIS — C50211 Malignant neoplasm of upper-inner quadrant of right female breast: Secondary | ICD-10-CM

## 2013-02-13 MED ORDER — RADIAPLEXRX EX GEL
Freq: Once | CUTANEOUS | Status: AC
  Administered 2013-02-13: 11:00:00 via TOPICAL

## 2013-02-13 NOTE — Progress Notes (Addendum)
Sherry Barry here for weekly under treat visit.  She has had 31 fractions to her right chest wall.  She denies pain and fatigue.  The skin on her right chest wall is peeling especially near her right underarm.  She also has hyperpigmentation on her right chest and her right upper chest/neck area.  She states that one area is itching.  She has been applying radiaplex gel twice a day.  She requested a refill and another tube has been given.

## 2013-02-13 NOTE — Progress Notes (Addendum)
Weekly Management Note:  Site: Right chest wall/mastectomy scar boost Current Dose:  5640  cGy Projected Dose: 6040  cGy  Narrative: The patient is seen today for routine under treatment assessment. CBCT/MVCT images/port films were reviewed. The chart was reviewed.   She is currently receiving her electron beam boost to her right chest wall/mastectomy scar. She uses Radioplex gel twice a day for her extensive dry desquamation.  Physical Examination:  Filed Vitals:   02/13/13 1105  BP: 129/94  Pulse: 97  Temp: 98 F (36.7 C)  .  Weight: 148 lb 14.4 oz (67.541 kg). There is marked hyperpigmentation the skin, particularly the right chest wall with extensive dry desquamation as expected. No areas of moist desquamation.  Impression: Tolerating radiation therapy well.  Plan: Continue radiation therapy as planned. One-month followup visit after completion of radiation therapy this Thursday.

## 2013-02-14 ENCOUNTER — Ambulatory Visit
Admission: RE | Admit: 2013-02-14 | Discharge: 2013-02-14 | Disposition: A | Discharge status: Home / Self Care | Payer: BC Managed Care – PPO | Source: Ambulatory Visit | Attending: Radiation Oncology | Admitting: Radiation Oncology

## 2013-02-15 ENCOUNTER — Encounter: Payer: Self-pay | Admitting: Radiation Oncology

## 2013-02-15 ENCOUNTER — Ambulatory Visit
Admission: RE | Admit: 2013-02-15 | Discharge: 2013-02-15 | Disposition: A | Discharge status: Home / Self Care | Payer: BC Managed Care – PPO | Source: Ambulatory Visit | Attending: Radiation Oncology | Admitting: Radiation Oncology

## 2013-02-15 NOTE — Progress Notes (Signed)
[facility] Radiation Oncology End of Treatment Note  Name:Sherry Barry  Date: 02/15/2013 ZOX:[id number] DOB:09-Aug-1970   Status:outpatient    CC: Leanor Rubenstein, MD  Dr. Harriette Bouillon  REFERRING PHYSICIAN:   Dr. 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 1.0 cm bolus applied to the right chest wall every other day to maximize the dose to the skin surface.                         Right chest wall/mastectomy scar boost 1000 cGy 5 sessions                         BEAMS/ENERGY:  Tangential fields to the right chest wall with 6 MV photons. LAO field to the right supraclavicular/axillary region with 10 MV photons, dose prescribed at 3 cm depth. 6 MV photons PA right axilla to bring the mid axillary dose to 4600 cGy in 28 sessions.  6 MEV electrons, right chest wall/mastectomy scar boost with 0.8 cm of custom bolus applied to the skin to maximize the dose to the skin surface.             NARRATIVE:  The patient tolerated treatment well with the expected degree of marked hyperpigmentation and dry desquamation the skin by completion of therapy. She had no areas of moist desquamation by completion of therapy. She used Radioplex gel during her course of treatment.                          PLAN: Routine followup in one month. Patient instructed to call if questions or worsening complaints in interim.

## 2013-02-22 ENCOUNTER — Ambulatory Visit (HOSPITAL_BASED_OUTPATIENT_CLINIC_OR_DEPARTMENT_OTHER): Payer: BC Managed Care – PPO

## 2013-02-22 ENCOUNTER — Other Ambulatory Visit (HOSPITAL_BASED_OUTPATIENT_CLINIC_OR_DEPARTMENT_OTHER): Payer: BC Managed Care – PPO | Admitting: Lab

## 2013-02-22 VITALS — BP 128/87 | HR 94 | Temp 98.2°F | Resp 20

## 2013-02-22 DIAGNOSIS — C50211 Malignant neoplasm of upper-inner quadrant of right female breast: Secondary | ICD-10-CM

## 2013-02-22 DIAGNOSIS — Z5112 Encounter for antineoplastic immunotherapy: Secondary | ICD-10-CM

## 2013-02-22 DIAGNOSIS — C50219 Malignant neoplasm of upper-inner quadrant of unspecified female breast: Secondary | ICD-10-CM

## 2013-02-22 LAB — CBC WITH DIFFERENTIAL/PLATELET
Basophils Absolute: 0 10*3/uL (ref 0.0–0.1)
Eosinophils Absolute: 0.2 10*3/uL (ref 0.0–0.5)
HGB: 14.3 g/dL (ref 11.6–15.9)
MCV: 86.4 fL (ref 79.5–101.0)
MONO#: 0.3 10*3/uL (ref 0.1–0.9)
NEUT#: 2.5 10*3/uL (ref 1.5–6.5)
RBC: 4.72 10*6/uL (ref 3.70–5.45)
RDW: 11.9 % (ref 11.2–14.5)
WBC: 4.2 10*3/uL (ref 3.9–10.3)
lymph#: 1.2 10*3/uL (ref 0.9–3.3)
nRBC: 0 % (ref 0–0)

## 2013-02-22 MED ORDER — HEPARIN SOD (PORK) LOCK FLUSH 100 UNIT/ML IV SOLN
500.0000 [IU] | Freq: Once | INTRAVENOUS | Status: AC | PRN
  Administered 2013-02-22: 500 [IU]
  Filled 2013-02-22: qty 5

## 2013-02-22 MED ORDER — DIPHENHYDRAMINE HCL 25 MG PO CAPS
ORAL_CAPSULE | ORAL | Status: AC
  Filled 2013-02-22: qty 2

## 2013-02-22 MED ORDER — SODIUM CHLORIDE 0.9 % IJ SOLN
10.0000 mL | INTRAMUSCULAR | Status: DC | PRN
  Administered 2013-02-22: 10 mL
  Filled 2013-02-22: qty 10

## 2013-02-22 MED ORDER — ACETAMINOPHEN 325 MG PO TABS
ORAL_TABLET | ORAL | Status: AC
  Filled 2013-02-22: qty 2

## 2013-02-22 MED ORDER — DIPHENHYDRAMINE HCL 25 MG PO CAPS
50.0000 mg | ORAL_CAPSULE | Freq: Once | ORAL | Status: AC
  Administered 2013-02-22: 50 mg via ORAL

## 2013-02-22 MED ORDER — SODIUM CHLORIDE 0.9 % IV SOLN
Freq: Once | INTRAVENOUS | Status: AC
  Administered 2013-02-22: 09:00:00 via INTRAVENOUS

## 2013-02-22 MED ORDER — ACETAMINOPHEN 325 MG PO TABS
650.0000 mg | ORAL_TABLET | Freq: Once | ORAL | Status: AC
  Administered 2013-02-22: 650 mg via ORAL

## 2013-02-22 MED ORDER — TRASTUZUMAB CHEMO INJECTION 440 MG
378.0000 mg | Freq: Once | INTRAVENOUS | Status: AC
  Administered 2013-02-22: 378 mg via INTRAVENOUS
  Filled 2013-02-22: qty 18

## 2013-02-22 NOTE — Patient Instructions (Addendum)
[facility] Discharge Instructions for Patients Receiving Chemotherapy  Today you received the following chemotherapy agents:  Herceptin  To help prevent nausea and vomiting after your treatment, we encourage you to take your nausea medication as ordered per MD.   If you develop nausea and vomiting that is not controlled by your nausea medication, call the clinic.   BELOW ARE SYMPTOMS THAT SHOULD BE REPORTED IMMEDIATELY:  *FEVER GREATER THAN 100.5 F  *CHILLS WITH OR WITHOUT FEVER  NAUSEA AND VOMITING THAT IS NOT CONTROLLED WITH YOUR NAUSEA MEDICATION  *UNUSUAL SHORTNESS OF BREATH  *UNUSUAL BRUISING OR BLEEDING  TENDERNESS IN MOUTH AND THROAT WITH OR WITHOUT PRESENCE OF ULCERS  *URINARY PROBLEMS  *BOWEL PROBLEMS  UNUSUAL RASH Items with * indicate a potential emergency and should be followed up as soon as possible.  Feel free to call the clinic you have any questions or concerns. The clinic phone number is [phone].    

## 2013-03-08 ENCOUNTER — Ambulatory Visit (INDEPENDENT_AMBULATORY_CARE_PROVIDER_SITE_OTHER): Payer: BC Managed Care – PPO | Admitting: Surgery

## 2013-03-08 ENCOUNTER — Encounter (INDEPENDENT_AMBULATORY_CARE_PROVIDER_SITE_OTHER): Payer: Self-pay | Admitting: Surgery

## 2013-03-08 VITALS — BP 130/82 | HR 76 | Resp 16 | Ht 62.5 in | Wt 148.6 lb

## 2013-03-08 DIAGNOSIS — Z853 Personal history of malignant neoplasm of breast: Secondary | ICD-10-CM

## 2013-03-08 DIAGNOSIS — C50919 Malignant neoplasm of unspecified site of unspecified female breast: Secondary | ICD-10-CM

## 2013-03-08 DIAGNOSIS — C50911 Malignant neoplasm of unspecified site of right female breast: Secondary | ICD-10-CM

## 2013-03-08 NOTE — Patient Instructions (Signed)
Return 6 months

## 2013-03-08 NOTE — Progress Notes (Signed)
42 y.o. Sherry Barry woman status post right mastectomy and sentinel lymph node sampling 07/14/2012 (with simple left mastectomy) for a pT2, pN1, stage IIB invasive ductal carcinoma, grade 3, estrogen receptor 100% and progesterone receptor 100% positive, with an MIB-1 of 48%, and HER-2 amplified by CISH with a ratio of 3.02.    She is doing well. Just completed radiation therapy.    Exam:   Bilateral mastectomy wounds clean dry intact without infection.  No seroma  Port site clean skin shows minimal radiation changes on right No lymph node enlargement.   Ext: no edema   Assessment:  42 y.o.  woman status post right mastectomy and sentinel lymph node sampling 07/14/2012 (with simple left mastectomy) for a pT2, pN1, stage IIB invasive ductal carcinoma, grade 3, estrogen receptor 100% and progesterone receptor 100% positive, with an MIB-1 of 48%, and HER-2 amplified by CISH with a ratio of 3.02.    Plan  : return 6 months

## 2013-03-09 ENCOUNTER — Telehealth (INDEPENDENT_AMBULATORY_CARE_PROVIDER_SITE_OTHER): Payer: Self-pay | Admitting: *Deleted

## 2013-03-09 NOTE — Telephone Encounter (Signed)
LMOM asking pt to return my call.  This is so that I may inform her of her appt with Dr. Kelly Splinter on 03/20/13 and to arrive at 10:45, located at [address] [facility].

## 2013-03-15 ENCOUNTER — Other Ambulatory Visit (HOSPITAL_BASED_OUTPATIENT_CLINIC_OR_DEPARTMENT_OTHER): Payer: BC Managed Care – PPO | Admitting: Lab

## 2013-03-15 ENCOUNTER — Ambulatory Visit (HOSPITAL_BASED_OUTPATIENT_CLINIC_OR_DEPARTMENT_OTHER): Payer: BC Managed Care – PPO

## 2013-03-15 VITALS — BP 129/89 | HR 89 | Temp 98.4°F

## 2013-03-15 DIAGNOSIS — C50211 Malignant neoplasm of upper-inner quadrant of right female breast: Secondary | ICD-10-CM

## 2013-03-15 DIAGNOSIS — C50219 Malignant neoplasm of upper-inner quadrant of unspecified female breast: Secondary | ICD-10-CM

## 2013-03-15 DIAGNOSIS — Z5112 Encounter for antineoplastic immunotherapy: Secondary | ICD-10-CM

## 2013-03-15 LAB — CBC WITH DIFFERENTIAL/PLATELET
BASO%: 0.5 % (ref 0.0–2.0)
Eosinophils Absolute: 0.2 10*3/uL (ref 0.0–0.5)
HCT: 39.9 % (ref 34.8–46.6)
HGB: 14.1 g/dL (ref 11.6–15.9)
LYMPH%: 30 % (ref 14.0–49.7)
MONO#: 0.3 10*3/uL (ref 0.1–0.9)
NEUT#: 2.2 10*3/uL (ref 1.5–6.5)
NEUT%: 58.2 % (ref 38.4–76.8)
Platelets: 151 10*3/uL (ref 145–400)
WBC: 3.8 10*3/uL — ABNORMAL LOW (ref 3.9–10.3)
lymph#: 1.1 10*3/uL (ref 0.9–3.3)

## 2013-03-15 LAB — COMPREHENSIVE METABOLIC PANEL (CC13)
ALT: 10 U/L (ref 0–55)
AST: 15 U/L (ref 5–34)
Albumin: 3.4 g/dL — ABNORMAL LOW (ref 3.5–5.0)
BUN: 13.4 mg/dL (ref 7.0–26.0)
CO2: 25 mEq/L (ref 22–29)
Calcium: 9.2 mg/dL (ref 8.4–10.4)
Chloride: 109 mEq/L (ref 98–109)
Creatinine: 0.8 mg/dL (ref 0.6–1.1)
Potassium: 3.7 mEq/L (ref 3.5–5.1)

## 2013-03-15 MED ORDER — DIPHENHYDRAMINE HCL 25 MG PO CAPS
ORAL_CAPSULE | ORAL | Status: AC
  Filled 2013-03-15: qty 2

## 2013-03-15 MED ORDER — SODIUM CHLORIDE 0.9 % IJ SOLN
10.0000 mL | INTRAMUSCULAR | Status: DC | PRN
  Administered 2013-03-15: 10 mL
  Filled 2013-03-15: qty 10

## 2013-03-15 MED ORDER — DIPHENHYDRAMINE HCL 25 MG PO CAPS
50.0000 mg | ORAL_CAPSULE | Freq: Once | ORAL | Status: AC
  Administered 2013-03-15: 50 mg via ORAL

## 2013-03-15 MED ORDER — ACETAMINOPHEN 325 MG PO TABS
650.0000 mg | ORAL_TABLET | Freq: Once | ORAL | Status: AC
  Administered 2013-03-15: 650 mg via ORAL

## 2013-03-15 MED ORDER — ACETAMINOPHEN 325 MG PO TABS
ORAL_TABLET | ORAL | Status: AC
  Filled 2013-03-15: qty 2

## 2013-03-15 MED ORDER — SODIUM CHLORIDE 0.9 % IV SOLN
Freq: Once | INTRAVENOUS | Status: AC
  Administered 2013-03-15: 09:00:00 via INTRAVENOUS

## 2013-03-15 MED ORDER — TRASTUZUMAB CHEMO INJECTION 440 MG
378.0000 mg | Freq: Once | INTRAVENOUS | Status: AC
  Administered 2013-03-15: 378 mg via INTRAVENOUS
  Filled 2013-03-15: qty 18

## 2013-03-15 MED ORDER — HEPARIN SOD (PORK) LOCK FLUSH 100 UNIT/ML IV SOLN
500.0000 [IU] | Freq: Once | INTRAVENOUS | Status: AC | PRN
  Administered 2013-03-15: 500 [IU]
  Filled 2013-03-15: qty 5

## 2013-03-15 NOTE — Patient Instructions (Addendum)
[facility] Discharge Instructions for Patients Receiving Chemotherapy  Today you received the following Herceptin.   If you develop nausea and vomiting that is not controlled by your nausea medication, call the clinic.   BELOW ARE SYMPTOMS THAT SHOULD BE REPORTED IMMEDIATELY:  *FEVER GREATER THAN 100.5 F  *CHILLS WITH OR WITHOUT FEVER  NAUSEA AND VOMITING THAT IS NOT CONTROLLED WITH YOUR NAUSEA MEDICATION  *UNUSUAL SHORTNESS OF BREATH  *UNUSUAL BRUISING OR BLEEDING  TENDERNESS IN MOUTH AND THROAT WITH OR WITHOUT PRESENCE OF ULCERS  *URINARY PROBLEMS  *BOWEL PROBLEMS  UNUSUAL RASH Items with * indicate a potential emergency and should be followed up as soon as possible.  Feel free to call the clinic you have any questions or concerns. The clinic phone number is [phone].    

## 2013-03-16 ENCOUNTER — Encounter: Payer: Self-pay | Admitting: Radiation Oncology

## 2013-03-16 DIAGNOSIS — C50919 Malignant neoplasm of unspecified site of unspecified female breast: Secondary | ICD-10-CM | POA: Insufficient documentation

## 2013-03-20 ENCOUNTER — Ambulatory Visit
Admission: RE | Admit: 2013-03-20 | Discharge: 2013-03-20 | Disposition: A | Discharge status: Home / Self Care | Payer: BC Managed Care – PPO | Source: Ambulatory Visit | Attending: Radiation Oncology | Admitting: Radiation Oncology

## 2013-03-20 ENCOUNTER — Encounter: Payer: Self-pay | Admitting: Radiation Oncology

## 2013-03-20 VITALS — BP 141/101 | HR 99 | Temp 98.8°F | Resp 20 | Wt 146.2 lb

## 2013-03-20 DIAGNOSIS — C50211 Malignant neoplasm of upper-inner quadrant of right female breast: Secondary | ICD-10-CM

## 2013-03-20 NOTE — Progress Notes (Signed)
Follow up s/p rad tx right chest wall, still has hyperpigmentation, skin intact, no c/o pain, started tamoxifen 20mg  po daily 02/22/13, wakes up a couple times at night, mild hot flashes, herceptin IV every 3 weeks  nerxt due 04/05/13, good appetite, energy level pretty good stated patient 3:58 PM

## 2013-03-20 NOTE — Progress Notes (Signed)
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 She is without complaints today. She continues with Herceptin. She is on adjuvant tamoxifen. She recently saw Dr. Luisa Hart, and she saw Dr. Kelly Splinter for discussion of reconstruction options earlier this week. She plans on reconstruction sometime in 2015.  Physical examination: Alert and oriented. Filed Vitals:   03/20/13 1555  BP: 141/101  Pulse: 99  Temp: 98.8 F (37.1 C)  Resp: 20   And neck examination: Grossly unremarkable. Nodes: Without palpable cervical, supraclavicular, or axillary lymphadenopathy. Chest: Bilateral mastectomies. There is residual hyperpigmentation the skin along the right chest wall. Lungs clear. Heart: Regular rate and rhythm. Abdomen: Without hepatomegaly. Extremities: Without edema.  Impression: Satisfactory progress.  Plan: Followup through [facility], and Dr. Darnelle Catalan. She'll see Dr. Kelly Splinter next year for discussion of breast reconstruction. She'll continue with Herceptin through March of 2015.

## 2013-04-05 ENCOUNTER — Ambulatory Visit (HOSPITAL_BASED_OUTPATIENT_CLINIC_OR_DEPARTMENT_OTHER): Payer: BC Managed Care – PPO | Admitting: Physician Assistant

## 2013-04-05 ENCOUNTER — Other Ambulatory Visit (HOSPITAL_BASED_OUTPATIENT_CLINIC_OR_DEPARTMENT_OTHER): Payer: BC Managed Care – PPO | Admitting: Lab

## 2013-04-05 ENCOUNTER — Telehealth: Payer: Self-pay | Admitting: Oncology

## 2013-04-05 ENCOUNTER — Telehealth: Payer: Self-pay | Admitting: *Deleted

## 2013-04-05 ENCOUNTER — Ambulatory Visit (HOSPITAL_BASED_OUTPATIENT_CLINIC_OR_DEPARTMENT_OTHER): Payer: BC Managed Care – PPO

## 2013-04-05 ENCOUNTER — Encounter: Payer: Self-pay | Admitting: Physician Assistant

## 2013-04-05 VITALS — BP 149/103 | HR 90 | Temp 97.7°F | Resp 18 | Ht 62.5 in | Wt 143.3 lb

## 2013-04-05 VITALS — BP 144/96 | HR 101 | Temp 97.8°F | Resp 18

## 2013-04-05 DIAGNOSIS — Z5112 Encounter for antineoplastic immunotherapy: Secondary | ICD-10-CM

## 2013-04-05 DIAGNOSIS — C50219 Malignant neoplasm of upper-inner quadrant of unspecified female breast: Secondary | ICD-10-CM

## 2013-04-05 DIAGNOSIS — Z170 Estrogen receptor positive status [ER+]: Secondary | ICD-10-CM

## 2013-04-05 DIAGNOSIS — C50211 Malignant neoplasm of upper-inner quadrant of right female breast: Secondary | ICD-10-CM

## 2013-04-05 LAB — COMPREHENSIVE METABOLIC PANEL (CC13)
ALT: 13 U/L (ref 0–55)
AST: 18 U/L (ref 5–34)
Albumin: 3.7 g/dL (ref 3.5–5.0)
Alkaline Phosphatase: 75 U/L (ref 40–150)
Calcium: 9.2 mg/dL (ref 8.4–10.4)
Chloride: 108 mEq/L (ref 98–109)
Potassium: 3.5 mEq/L (ref 3.5–5.1)
Sodium: 143 mEq/L (ref 136–145)
Total Protein: 7.2 g/dL (ref 6.4–8.3)

## 2013-04-05 LAB — CBC WITH DIFFERENTIAL/PLATELET
BASO%: 0.4 % (ref 0.0–2.0)
EOS%: 1.6 % (ref 0.0–7.0)
HCT: 40.4 % (ref 34.8–46.6)
HGB: 14.5 g/dL (ref 11.6–15.9)
MCH: 29.7 pg (ref 25.1–34.0)
MCHC: 35.9 g/dL (ref 31.5–36.0)
MONO#: 0.2 10*3/uL (ref 0.1–0.9)
RDW: 12.6 % (ref 11.2–14.5)
WBC: 4.5 10*3/uL (ref 3.9–10.3)
lymph#: 1.6 10*3/uL (ref 0.9–3.3)

## 2013-04-05 MED ORDER — SODIUM CHLORIDE 0.9 % IV SOLN
Freq: Once | INTRAVENOUS | Status: AC
  Administered 2013-04-05: 13:00:00 via INTRAVENOUS

## 2013-04-05 MED ORDER — DIPHENHYDRAMINE HCL 25 MG PO CAPS
50.0000 mg | ORAL_CAPSULE | Freq: Once | ORAL | Status: AC
  Administered 2013-04-05: 50 mg via ORAL

## 2013-04-05 MED ORDER — SODIUM CHLORIDE 0.9 % IJ SOLN
10.0000 mL | INTRAMUSCULAR | Status: DC | PRN
  Administered 2013-04-05: 10 mL
  Filled 2013-04-05: qty 10

## 2013-04-05 MED ORDER — TRASTUZUMAB CHEMO INJECTION 440 MG
378.0000 mg | Freq: Once | INTRAVENOUS | Status: AC
  Administered 2013-04-05: 378 mg via INTRAVENOUS
  Filled 2013-04-05: qty 18

## 2013-04-05 MED ORDER — ACETAMINOPHEN 325 MG PO TABS
ORAL_TABLET | ORAL | Status: AC
  Filled 2013-04-05: qty 2

## 2013-04-05 MED ORDER — ACETAMINOPHEN 325 MG PO TABS
650.0000 mg | ORAL_TABLET | Freq: Once | ORAL | Status: AC
  Administered 2013-04-05: 650 mg via ORAL

## 2013-04-05 MED ORDER — DIPHENHYDRAMINE HCL 25 MG PO CAPS
ORAL_CAPSULE | ORAL | Status: AC
  Filled 2013-04-05: qty 2

## 2013-04-05 MED ORDER — HEPARIN SOD (PORK) LOCK FLUSH 100 UNIT/ML IV SOLN
500.0000 [IU] | Freq: Once | INTRAVENOUS | Status: AC | PRN
  Administered 2013-04-05: 500 [IU]
  Filled 2013-04-05: qty 5

## 2013-04-05 NOTE — Patient Instructions (Signed)
[facility] Discharge Instructions for Patients Receiving Chemotherapy  Today you received the following chemotherapy agents :  Herceptin.  To help prevent nausea and vomiting after your treatment, we encourage you to take your nausea medication as instructed by your physician.   If you develop nausea and vomiting that is not controlled by your nausea medication, call the clinic.   BELOW ARE SYMPTOMS THAT SHOULD BE REPORTED IMMEDIATELY:  *FEVER GREATER THAN 100.5 F  *CHILLS WITH OR WITHOUT FEVER  NAUSEA AND VOMITING THAT IS NOT CONTROLLED WITH YOUR NAUSEA MEDICATION  *UNUSUAL SHORTNESS OF BREATH  *UNUSUAL BRUISING OR BLEEDING  TENDERNESS IN MOUTH AND THROAT WITH OR WITHOUT PRESENCE OF ULCERS  *URINARY PROBLEMS  *BOWEL PROBLEMS  UNUSUAL RASH Items with * indicate a potential emergency and should be followed up as soon as possible.  Feel free to call the clinic you have any questions or concerns. The clinic phone number is [phone].    

## 2013-04-05 NOTE — Telephone Encounter (Signed)
Per staff message and POF I have scheduled appts.  JMW  

## 2013-04-05 NOTE — Progress Notes (Signed)
ID: Sherry Barry   DOB: Dec 27, 1970  MR#: [id number]  WUJ#:[id number]  PCP: Leanor Rubenstein, MD GYN: Osborn Coho. MD SU: Harriette Bouillon. 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 At that time a digital bilateal diagnostic mammogram showed a slight decrease in size of the benign fibroadenoma in the left breast compared to 2010. The same mammogram, however, also refilled pleomorphic calcifications extending over 9 cm in the inner third of the right breast. There were no suspicious calcifications in the left breast. On exam, Dr. Lyman Bishop was able to palpate a small freely mobile mass in the lower inner left breast, but was unable to palpate any abnormalities in the right breast other than some focal thickening.  An subsequent ultrasound of the right breast on 06/16/2012 showed multiple hypoechoic masses throughout the upper quadrants of the right breast. The palpable mass in the 1:00 location corresponded with an irregular hypoechoic mass measuring 1.7 x 1.4 cm. There was also a discrete hypoechoic lobulated mass at the 11:00 position measuring 2.1 x 1.1 x 2.1 cm. The third lesion identified at the 11:00 location, 3 cm from the nipple, measured 1.0 x 0.8 x 0.9 cm.   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  Bilateral breast MRI 06/23/2012 showed an enhancing mass in the upper central right breast measuring 1.8 cm, together with clumped nodular enhancement extending for a total area of 11.4 cm. In the upper  outer quadrant of this breast there were 3 discrete irregular masses measuring up to 3 cm. This correlated well with the ultrasound findings previously. In the left breast there was a large area of clumped nodular enhancement measuring up to 8.9 cm. In the lower central portion there was a 1.7 cm circumscribed nodule consistent with a known fibroadenoma. There weas no axillary or internal mammary adenopathy noted on either side.   The 8.9 cm area of abnormality in the left breast was biopsied on 06/30/2012 showing fibrocystic changes with adenosis and calcifications, pseudoangiomatous stromal hyperplasia, but no evidence of malignancy.  The patient underwent bilateral mastectomies under the care of Dr. Luisa Hart on 07/14/2012. The left mastectomy revealed benign breast tissue and was negative for malignancy. 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 Lymphovascular invasion was identified; #2) a ductal carcinoma in situ, grade 3, with comedo necrosis.  Surgical margins were negative. 4 lymph nodes were examined, one positive for metastatic mammary carcinoma, a second positive for isolated tumor cells. pT2, pN1a  Subsequent history is detailed below.  INTERVAL HISTORY: Sherry Barry returns alone today for followup of her right breast cancer. Since her last appointment here on August 21, she completed radiation therapy which she tolerated well. She started her tamoxifen in mid-September and has had no significant side effects. She does have occasional hot flashes, but notes that these have actually improved since chemotherapy. She's had no vaginal dryness or bleeding but does have some mild clear  vaginal discharge.  Sherry Barry continues to receive Herceptin on a Q. three-week basis and is due for her next dose today. Her last echocardiogram in August showed a well preserved ejection fraction of 60%.  Otherwise,  interval history is notable only for Sherry Barry having recently had a "cold" which is improving. She's had no fevers or chills. She has had a runny nose with some sinus congestion. She denies any cough, phlegm production, or shortness of breath.  REVIEW OF SYSTEMS: Sherry Barry's energy level is slowly improving. She is trying to exercise on a more regular basis. She is back to work full-time. Physically, she had no new complaints today. She's had no skin changes or rashes, and denies any bruising or abnormal bleeding. Her appetite is good, and she denies any nausea or change in bowel or bladder habits. She's had no  orthopnea, chest pain, palpitations, or peripheral swelling. She has occasional headaches, but nothing she would consider abnormal. These come and go very quickly. She's had no change in vision or dizziness.  She has no problems with peripheral neuropathy.  She also denies any current myalgias, arthralgias, or bony pain.  A detailed review of systems is otherwise stable and noncontributory.   PAST MEDICAL HISTORY: Past Medical History  Diagnosis Date  . Allergy   . Migraine   . Migraine   . History of migraine     last one about a week ago  . History of chemotherapy     doxetaxel/carboplatin/trastuzumab  . Breast cancer   . Breast cancer     right  . Hypertension   . Hx of radiation therapy 01/01/13- 02/15/13    r chest wall, r supraclav/axilla 5040 cGy/28 sessions, scar boost 1000 cGy/5 sessions    PAST SURGICAL HISTORY: Past Surgical History  Procedure Laterality Date  . Simple mastectomy with axillary sentinel node biopsy  07/14/2012    Procedure: SIMPLE MASTECTOMY WITH AXILLARY SENTINEL NODE BIOPSY;  Surgeon: Clovis Pu. Cornett, MD;  Location: [facility] OR;  Service: General;  Laterality: Right;  Bilateral simple mastectomy with port and right sebtibel lymph node mapping  . Simple mastectomy with axillary sentinel node biopsy  07/14/2012    Procedure: SIMPLE MASTECTOMY;  Surgeon: Clovis Pu.  Cornett, MD;  Location: [facility] OR;  Service: General;  Laterality: Left;  . Portacath placement  07/14/2012    Procedure: INSERTION PORT-A-CATH;  Surgeon: Clovis Pu. Cornett, MD;  Location: [facility] OR;  Service: General;  Laterality: Left;  . Abdominal hysterectomy      no salpingo-oophorectomy    FAMILY HISTORY Family History  Problem Relation Age of Onset  . Hypertension Mother   . Alcohol abuse Mother   . Heart disease Maternal Grandmother   . Stroke Maternal Grandfather   . Colon cancer Maternal Aunt 55    alive at 14  . Brain cancer Maternal Uncle 66    and lymphoma in early 63s; deceased  . Brain cancer Maternal Uncle 60    deceased  . Pancreatic cancer Maternal Uncle 45    alive at 22  . Breast cancer Maternal Aunt     great aunt through mat GF; dx at ? age  . Breast cancer Cousin 65    mat 1st cousin once removed through mat GF ; deceased   the patient's father died in an automobile accident in his early 10s. The patient's mother is living, currently age 54. The patient has one brother and one sister. There is no family history of breast or ovarian cancer,  although there are maternal uncles with brain, colon and lymph node cancers.  GYNECOLOGIC HISTORY: Menarche age 32, first live birth age 74, she is GX P2. She stopped having periods with her surgery March of 2009. She does have "premenstrual symptoms" on a regular basis.  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 Her husband Brand Males works for a company in the [place] doing testing. Their children Jaden (9) and Bryson (4) at home.   ADVANCED DIRECTIVES: not in place  HEALTH MAINTENANCE:  (Updated 04/05/2013) History  Substance Use Topics  . Smoking status: Never Smoker   . Smokeless tobacco: Never Used  . Alcohol Use: Yes     Comment: occasional     Colonoscopy: Never  PAP: 09/2010   Bone density: Never  Lipid panel:  Allergies  Allergen Reactions  .  Codeine Itching  . Latex Swelling    Current Outpatient Prescriptions  Medication Sig Dispense Refill  . lidocaine-prilocaine (EMLA) cream Apply topically as needed. Apply to port 1-2 hours before chemo and cover with plastic wrap  30 g  0  . mometasone (NASONEX) 50 MCG/ACT nasal spray Place 2 sprays into the nose daily.      . tamoxifen (NOLVADEX) 20 MG tablet Take 20 mg by mouth daily.       No current facility-administered medications for this visit.    OBJECTIVE: Young-appearing African American woman in no acute distress Filed Vitals:   04/05/13 1140  BP: 149/103  Pulse: 90  Temp: 97.7 F (36.5 C)  Resp: 18     Body mass index is 25.78 kg/(m^2).    ECOG FS: 0 Filed Weights   04/05/13 1140  Weight: 143 lb 4.8 oz (65 kg)   Physical Exam: HEENT:  Sclerae anicteric.  Oropharynx clear.  NODES:  No cervical or supraclavicular lymphadenopathy palpated.  BREAST EXAM:  Patient status post bilateral mastectomies. No evidence of local recurrence on the right. Axillae are benign bilaterally, with no palpable lymphadenopathy. LUNGS:  Clear to auscultation bilaterally.  No wheezes or rhonchi. HEART:  Regular rate and rhythm. No murmur appreciated ABDOMEN:  Soft, nontender.  Positive bowel sounds.  MSK:  No focal spinal tenderness to palpation. Full range of motion in the upper extremities. EXTREMITIES:  No peripheral edema.   SKIN:  Port is intact in the left upper chest wall, no erythema, edema, or evidence of infection. NEURO:  Nonfocal. Well oriented.  Positive affect.    LAB RESULTS: Lab Results  Component Value Date   WBC 4.5 04/05/2013   NEUTROABS 2.6 04/05/2013   HGB 14.5 04/05/2013   HCT 40.4 04/05/2013   MCV 82.6 04/05/2013   PLT 182 04/05/2013      Chemistry      Component Value Date/Time   NA 143 03/15/2013 0825   NA 135 07/15/2012 0500   K 3.7 03/15/2013 0825   K 3.9 07/15/2012 0500   CL 101 11/15/2012 0904   CL 103 07/15/2012 0500   CO2 25 03/15/2013 0825   CO2  25 07/15/2012 0500   BUN 13.4 03/15/2013 0825   BUN 7 07/15/2012 0500   CREATININE 0.8 03/15/2013 0825   CREATININE 0.62 07/15/2012 0500   CREATININE 0.80 05/02/2012 1412      Component Value Date/Time   CALCIUM 9.2 03/15/2013 0825   CALCIUM 8.2* 07/15/2012 0500   ALKPHOS 63 03/15/2013 0825   ALKPHOS 46 07/14/2012 2300   AST 15 03/15/2013 0825   AST  14 07/14/2012 2300   ALT 10 03/15/2013 0825   ALT 7 07/14/2012 2300   BILITOT 0.35 03/15/2013 0825   BILITOT 0.3 07/14/2012 2300       STUDIES:  Echocardiogram 02/07/2013 showed an ejection fraction of 60 %.      ASSESSMENT: 42 y.o. [place] woman   (1)  status post right mastectomy and sentinel lymph node sampling 07/14/2012 (with simple left mastectomy)  for a pT2, pN1, stage IIB invasive ductal carcinoma, grade 3, estrogen receptor 100% and progesterone receptor 100% positive, with an MIB-1 of 48%, and HER-2 amplified by CISH with a ratio of 3.02.  (2) adjuvant chemotherapy started 08/17/2012, consisting of carboplatin, docetaxel and trastuzumab given every 3 weeks for 6 cycles, completed 11/30/2012  (3) trastuzumab to be continued to March of 2015. Most recent echocardiogram 10/30/2012  (4)  Status post radiation therapy, completed in early September  (5) started on tamoxifen in mid September 2014  PLAN:  Arlen continues to do very well with regards to her breast cancer. She will continue on her tamoxifen which she is tolerating well. She'll also continue on trastuzumab every 3 weeks, the next dose given today as scheduled. She is due for her next echocardiogram and followup with Dr.  Gala Romney in late November. I will see her again for followup visit with physical exam in mid January. She will complete her trastuzumab in March, and at that time will begin routine followup visits.  All this was reviewed in detail with [place] today, and she voices understanding and agreement with this plan. She will call with any changes or problems.     Stephaie Dardis PA-C    04/05/2013

## 2013-04-09 ENCOUNTER — Encounter: Payer: Self-pay | Admitting: Oncology

## 2013-04-19 ENCOUNTER — Other Ambulatory Visit: Payer: Self-pay

## 2013-04-26 ENCOUNTER — Other Ambulatory Visit (HOSPITAL_BASED_OUTPATIENT_CLINIC_OR_DEPARTMENT_OTHER): Payer: BC Managed Care – PPO | Admitting: Lab

## 2013-04-26 ENCOUNTER — Ambulatory Visit (HOSPITAL_BASED_OUTPATIENT_CLINIC_OR_DEPARTMENT_OTHER): Payer: BC Managed Care – PPO

## 2013-04-26 VITALS — BP 133/99 | HR 111 | Temp 98.1°F

## 2013-04-26 DIAGNOSIS — C50211 Malignant neoplasm of upper-inner quadrant of right female breast: Secondary | ICD-10-CM

## 2013-04-26 DIAGNOSIS — Z5112 Encounter for antineoplastic immunotherapy: Secondary | ICD-10-CM

## 2013-04-26 DIAGNOSIS — C50219 Malignant neoplasm of upper-inner quadrant of unspecified female breast: Secondary | ICD-10-CM

## 2013-04-26 LAB — COMPREHENSIVE METABOLIC PANEL (CC13)
ALT: 16 U/L (ref 0–55)
Alkaline Phosphatase: 66 U/L (ref 40–150)
Anion Gap: 11 mEq/L (ref 3–11)
CO2: 23 mEq/L (ref 22–29)
Creatinine: 0.7 mg/dL (ref 0.6–1.1)
Total Bilirubin: 0.43 mg/dL (ref 0.20–1.20)

## 2013-04-26 LAB — CBC WITH DIFFERENTIAL/PLATELET
EOS%: 2.5 % (ref 0.0–7.0)
Eosinophils Absolute: 0.1 10*3/uL (ref 0.0–0.5)
LYMPH%: 31.6 % (ref 14.0–49.7)
MCH: 29.5 pg (ref 25.1–34.0)
MCHC: 35.8 g/dL (ref 31.5–36.0)
MCV: 82.4 fL (ref 79.5–101.0)
MONO%: 6.1 % (ref 0.0–14.0)
Platelets: 177 10*3/uL (ref 145–400)
RBC: 4.99 10*6/uL (ref 3.70–5.45)
nRBC: 0 % (ref 0–0)

## 2013-04-26 MED ORDER — SODIUM CHLORIDE 0.9 % IV SOLN
378.0000 mg | Freq: Once | INTRAVENOUS | Status: AC
  Administered 2013-04-26: 378 mg via INTRAVENOUS
  Filled 2013-04-26: qty 18

## 2013-04-26 MED ORDER — DIPHENHYDRAMINE HCL 25 MG PO CAPS
ORAL_CAPSULE | ORAL | Status: AC
  Filled 2013-04-26: qty 2

## 2013-04-26 MED ORDER — ACETAMINOPHEN 325 MG PO TABS
650.0000 mg | ORAL_TABLET | Freq: Once | ORAL | Status: AC
  Administered 2013-04-26: 650 mg via ORAL

## 2013-04-26 MED ORDER — HEPARIN SOD (PORK) LOCK FLUSH 100 UNIT/ML IV SOLN
500.0000 [IU] | Freq: Once | INTRAVENOUS | Status: AC | PRN
  Administered 2013-04-26: 500 [IU]
  Filled 2013-04-26: qty 5

## 2013-04-26 MED ORDER — SODIUM CHLORIDE 0.9 % IV SOLN
Freq: Once | INTRAVENOUS | Status: AC
  Administered 2013-04-26: 12:00:00 via INTRAVENOUS

## 2013-04-26 MED ORDER — DIPHENHYDRAMINE HCL 25 MG PO CAPS
50.0000 mg | ORAL_CAPSULE | Freq: Once | ORAL | Status: AC
  Administered 2013-04-26: 50 mg via ORAL

## 2013-04-26 MED ORDER — SODIUM CHLORIDE 0.9 % IJ SOLN
10.0000 mL | INTRAMUSCULAR | Status: DC | PRN
  Administered 2013-04-26: 10 mL
  Filled 2013-04-26: qty 10

## 2013-04-26 NOTE — Patient Instructions (Signed)
[facility] Discharge Instructions for Patients Receiving Chemotherapy  Today you received the following chemotherapy agents Herceptin.  To help prevent nausea and vomiting after your treatment, we encourage you to take your nausea medication as prescribed.   If you develop nausea and vomiting that is not controlled by your nausea medication, call the clinic.   BELOW ARE SYMPTOMS THAT SHOULD BE REPORTED IMMEDIATELY:  *FEVER GREATER THAN 100.5 F  *CHILLS WITH OR WITHOUT FEVER  NAUSEA AND VOMITING THAT IS NOT CONTROLLED WITH YOUR NAUSEA MEDICATION  *UNUSUAL SHORTNESS OF BREATH  *UNUSUAL BRUISING OR BLEEDING  TENDERNESS IN MOUTH AND THROAT WITH OR WITHOUT PRESENCE OF ULCERS  *URINARY PROBLEMS  *BOWEL PROBLEMS  UNUSUAL RASH Items with * indicate a potential emergency and should be followed up as soon as possible.  Feel free to call the clinic you have any questions or concerns. The clinic phone number is [phone].    

## 2013-04-30 ENCOUNTER — Ambulatory Visit (HOSPITAL_BASED_OUTPATIENT_CLINIC_OR_DEPARTMENT_OTHER)
Admission: RE | Admit: 2013-04-30 | Discharge: 2013-04-30 | Disposition: A | Discharge status: Home / Self Care | Payer: BC Managed Care – PPO | Source: Ambulatory Visit | Attending: Internal Medicine | Admitting: Internal Medicine

## 2013-04-30 ENCOUNTER — Encounter (HOSPITAL_COMMUNITY): Payer: Self-pay

## 2013-04-30 ENCOUNTER — Ambulatory Visit (HOSPITAL_COMMUNITY)
Admission: RE | Admit: 2013-04-30 | Discharge: 2013-04-30 | Disposition: A | Discharge status: Home / Self Care | Payer: BC Managed Care – PPO | Source: Ambulatory Visit | Attending: Internal Medicine | Admitting: Internal Medicine

## 2013-04-30 VITALS — BP 132/98 | HR 86 | Wt 140.0 lb

## 2013-04-30 DIAGNOSIS — Z79899 Other long term (current) drug therapy: Secondary | ICD-10-CM | POA: Insufficient documentation

## 2013-04-30 DIAGNOSIS — I10 Essential (primary) hypertension: Secondary | ICD-10-CM | POA: Insufficient documentation

## 2013-04-30 DIAGNOSIS — C50211 Malignant neoplasm of upper-inner quadrant of right female breast: Secondary | ICD-10-CM

## 2013-04-30 DIAGNOSIS — Z923 Personal history of irradiation: Secondary | ICD-10-CM | POA: Insufficient documentation

## 2013-04-30 DIAGNOSIS — C50919 Malignant neoplasm of unspecified site of unspecified female breast: Secondary | ICD-10-CM | POA: Insufficient documentation

## 2013-04-30 DIAGNOSIS — G43909 Migraine, unspecified, not intractable, without status migrainosus: Secondary | ICD-10-CM | POA: Insufficient documentation

## 2013-04-30 DIAGNOSIS — C50219 Malignant neoplasm of upper-inner quadrant of unspecified female breast: Secondary | ICD-10-CM

## 2013-04-30 DIAGNOSIS — Z09 Encounter for follow-up examination after completed treatment for conditions other than malignant neoplasm: Secondary | ICD-10-CM

## 2013-04-30 NOTE — Progress Notes (Signed)
Echo Lab  2D Echocardiogram completed.  Nevaeha Finerty L Dilyn Osoria, RDCS 04/30/2013 8:47 AM

## 2013-04-30 NOTE — Progress Notes (Signed)
Patient ID: Sherry Barry, female   DOB: 1970/09/23, 42 y.o.   MRN: [id number]  HPI: Sherry Barry is a 42 y.o. 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  She is s/p bilateral mastectomy 07/14/12.  She is followed by Dr. Darnelle Catalan was started on adjuvant chemotherapy on 08/17/12 consisting of carboplatin, docetaxel and trastuzumab given every 3 weeks for 6 cycles.  She will then continue trastuzumab for 1 year.    Echo: 08/01/12: EF 60-65% Lat s' 11.4 10/30/12: EF 60-65% lat s' 11.7 02/07/13 EF 60% lat s' 11.4 04/30/13 EF 60%, lat s' 10.9, global strain -20.3  Follow up: Doing well. Denies SOB, orthopnea, CP or edema. Done with radiation and will complete Herceptin March 2015.   Review of Systems:   Past Medical History  Diagnosis Date  . Allergy   . Migraine   . Migraine   . History of migraine     last one about a week ago  . History of chemotherapy     doxetaxel/carboplatin/trastuzumab  . Breast cancer   . Breast cancer     right  . Hypertension   . Hx of radiation therapy 01/01/13- 02/15/13    r chest wall, r supraclav/axilla 5040 cGy/28 sessions, scar boost 1000 cGy/5 sessions    Current Outpatient Prescriptions  Medication Sig Dispense Refill  . lidocaine-prilocaine (EMLA) cream Apply topically as needed. Apply to port 1-2 hours before chemo and cover with plastic wrap  30 g  0  . mometasone (NASONEX) 50 MCG/ACT nasal spray Place 2 sprays into the nose daily.      . tamoxifen (NOLVADEX) 20 MG tablet Take 20 mg by mouth daily.       No current facility-administered medications for this encounter.   Allergies  Allergen Reactions  . Codeine Itching  . Latex Swelling   Filed Vitals:   04/30/13 0850  BP: 132/98  Pulse: 86  Weight: 140 lb (63.504 kg)  SpO2: 98%    PHYSICAL EXAM: General:  Well appearing. No respiratory difficulty HEENT: normal Neck: supple. no JVD. Carotids 2+ bilat; no bruits. No lymphadenopathy  or thryomegaly appreciated. Cor: PMI nondisplaced. Regular rate & rhythm. No rubs, gallops or murmurs. L uuper chest scar  Lungs: clear Abdomen: soft, nontender, nondistended. No hepatosplenomegaly. No bruits or masses. Good bowel sounds. Extremities: no cyanosis, clubbing, rash, edema Neuro: alert & oriented x 3, cranial nerves grossly intact. moves all 4 extremities w/o difficulty. Affect pleasant.   ASSESSMENT & PLAN:  1. R breast cancer  - Doing well. ECHO reviewed by Dr. Gala Romney and compared her previous ECHOs. Lateral s' and EF stable. He reviewed previous ECHO and feels that lateral s' was 11.4 not 12.0. Will continue therapy and repeat ECHO in 3 months. -EF 60%, lateral s' 10.9, global strain -20.3  Follow up in 3 months with an ECHO  Ulla Potash B NP-C 9:07 AM

## 2013-04-30 NOTE — Patient Instructions (Signed)
Doing great.  Have a wonderful Holiday Season.  Call any issues.  Follow up 3 months with ECHO.

## 2013-05-17 ENCOUNTER — Ambulatory Visit (HOSPITAL_BASED_OUTPATIENT_CLINIC_OR_DEPARTMENT_OTHER): Payer: BC Managed Care – PPO

## 2013-05-17 ENCOUNTER — Other Ambulatory Visit (HOSPITAL_BASED_OUTPATIENT_CLINIC_OR_DEPARTMENT_OTHER): Payer: BC Managed Care – PPO

## 2013-05-17 VITALS — BP 149/92 | HR 100 | Temp 98.2°F

## 2013-05-17 DIAGNOSIS — C50211 Malignant neoplasm of upper-inner quadrant of right female breast: Secondary | ICD-10-CM

## 2013-05-17 DIAGNOSIS — Z5112 Encounter for antineoplastic immunotherapy: Secondary | ICD-10-CM

## 2013-05-17 DIAGNOSIS — C50219 Malignant neoplasm of upper-inner quadrant of unspecified female breast: Secondary | ICD-10-CM

## 2013-05-17 LAB — CBC WITH DIFFERENTIAL/PLATELET
BASO%: 0.5 % (ref 0.0–2.0)
EOS%: 2.6 % (ref 0.0–7.0)
Eosinophils Absolute: 0.1 10*3/uL (ref 0.0–0.5)
LYMPH%: 34.7 % (ref 14.0–49.7)
MCH: 29.7 pg (ref 25.1–34.0)
MCHC: 35.7 g/dL (ref 31.5–36.0)
MCV: 83.3 fL (ref 79.5–101.0)
MONO%: 6.8 % (ref 0.0–14.0)
Platelets: 175 10*3/uL (ref 145–400)
RBC: 4.78 10*6/uL (ref 3.70–5.45)
RDW: 13.5 % (ref 11.2–14.5)
WBC: 4.3 10*3/uL (ref 3.9–10.3)

## 2013-05-17 MED ORDER — ACETAMINOPHEN 325 MG PO TABS
650.0000 mg | ORAL_TABLET | Freq: Once | ORAL | Status: AC
  Administered 2013-05-17: 650 mg via ORAL

## 2013-05-17 MED ORDER — SODIUM CHLORIDE 0.9 % IJ SOLN
10.0000 mL | INTRAMUSCULAR | Status: DC | PRN
  Administered 2013-05-17: 10 mL
  Filled 2013-05-17: qty 10

## 2013-05-17 MED ORDER — DIPHENHYDRAMINE HCL 25 MG PO CAPS
50.0000 mg | ORAL_CAPSULE | Freq: Once | ORAL | Status: AC
  Administered 2013-05-17: 50 mg via ORAL

## 2013-05-17 MED ORDER — DIPHENHYDRAMINE HCL 25 MG PO CAPS
ORAL_CAPSULE | ORAL | Status: AC
  Filled 2013-05-17: qty 2

## 2013-05-17 MED ORDER — TRASTUZUMAB CHEMO INJECTION 440 MG
378.0000 mg | Freq: Once | INTRAVENOUS | Status: AC
  Administered 2013-05-17: 378 mg via INTRAVENOUS
  Filled 2013-05-17: qty 18

## 2013-05-17 MED ORDER — SODIUM CHLORIDE 0.9 % IV SOLN
Freq: Once | INTRAVENOUS | Status: AC
  Administered 2013-05-17: 11:00:00 via INTRAVENOUS

## 2013-05-17 MED ORDER — HEPARIN SOD (PORK) LOCK FLUSH 100 UNIT/ML IV SOLN
500.0000 [IU] | Freq: Once | INTRAVENOUS | Status: AC | PRN
  Administered 2013-05-17: 500 [IU]
  Filled 2013-05-17: qty 5

## 2013-05-17 MED ORDER — ACETAMINOPHEN 325 MG PO TABS
ORAL_TABLET | ORAL | Status: AC
  Filled 2013-05-17: qty 2

## 2013-05-17 NOTE — Patient Instructions (Signed)
[facility] Discharge Instructions for Patients Receiving Chemotherapy  Today you received the following chemotherapy agents Herceptin.  To help prevent nausea and vomiting after your treatment, we encourage you to take your nausea medication as prescribed.   If you develop nausea and vomiting that is not controlled by your nausea medication, call the clinic.   BELOW ARE SYMPTOMS THAT SHOULD BE REPORTED IMMEDIATELY:  *FEVER GREATER THAN 100.5 F  *CHILLS WITH OR WITHOUT FEVER  NAUSEA AND VOMITING THAT IS NOT CONTROLLED WITH YOUR NAUSEA MEDICATION  *UNUSUAL SHORTNESS OF BREATH  *UNUSUAL BRUISING OR BLEEDING  TENDERNESS IN MOUTH AND THROAT WITH OR WITHOUT PRESENCE OF ULCERS  *URINARY PROBLEMS  *BOWEL PROBLEMS  UNUSUAL RASH Items with * indicate a potential emergency and should be followed up as soon as possible.  Feel free to call the clinic you have any questions or concerns. The clinic phone number is [phone].    

## 2013-06-08 ENCOUNTER — Ambulatory Visit (HOSPITAL_BASED_OUTPATIENT_CLINIC_OR_DEPARTMENT_OTHER): Payer: BC Managed Care – PPO

## 2013-06-08 ENCOUNTER — Other Ambulatory Visit (HOSPITAL_BASED_OUTPATIENT_CLINIC_OR_DEPARTMENT_OTHER): Payer: BC Managed Care – PPO

## 2013-06-08 VITALS — BP 136/99 | HR 89 | Temp 98.1°F | Resp 18

## 2013-06-08 DIAGNOSIS — C50219 Malignant neoplasm of upper-inner quadrant of unspecified female breast: Secondary | ICD-10-CM

## 2013-06-08 DIAGNOSIS — C50211 Malignant neoplasm of upper-inner quadrant of right female breast: Secondary | ICD-10-CM

## 2013-06-08 DIAGNOSIS — Z5112 Encounter for antineoplastic immunotherapy: Secondary | ICD-10-CM

## 2013-06-08 LAB — CBC WITH DIFFERENTIAL/PLATELET
Basophils Absolute: 0 10*3/uL (ref 0.0–0.1)
Eosinophils Absolute: 0.1 10*3/uL (ref 0.0–0.5)
HCT: 41.3 % (ref 34.8–46.6)
HGB: 14.7 g/dL (ref 11.6–15.9)
LYMPH%: 34.1 % (ref 14.0–49.7)
MCV: 85.3 fL (ref 79.5–101.0)
MONO#: 0.3 10*3/uL (ref 0.1–0.9)
MONO%: 7.6 % (ref 0.0–14.0)
NEUT#: 2.3 10*3/uL (ref 1.5–6.5)
NEUT%: 55.6 % (ref 38.4–76.8)
Platelets: 173 10*3/uL (ref 145–400)
RBC: 4.84 10*6/uL (ref 3.70–5.45)
RDW: 13.3 % (ref 11.2–14.5)
nRBC: 0 % (ref 0–0)

## 2013-06-08 LAB — COMPREHENSIVE METABOLIC PANEL (CC13)
ALT: 14 U/L (ref 0–55)
Anion Gap: 9 mEq/L (ref 3–11)
BUN: 12.7 mg/dL (ref 7.0–26.0)
CO2: 25 mEq/L (ref 22–29)
Calcium: 9 mg/dL (ref 8.4–10.4)
Chloride: 110 mEq/L — ABNORMAL HIGH (ref 98–109)
Creatinine: 1 mg/dL (ref 0.6–1.1)
Glucose: 87 mg/dl (ref 70–140)
Sodium: 143 mEq/L (ref 136–145)
Total Bilirubin: 0.5 mg/dL (ref 0.20–1.20)

## 2013-06-08 MED ORDER — TRASTUZUMAB CHEMO INJECTION 440 MG
378.0000 mg | Freq: Once | INTRAVENOUS | Status: AC
  Administered 2013-06-08: 378 mg via INTRAVENOUS
  Filled 2013-06-08: qty 18

## 2013-06-08 MED ORDER — DIPHENHYDRAMINE HCL 25 MG PO CAPS
ORAL_CAPSULE | ORAL | Status: AC
  Filled 2013-06-08: qty 2

## 2013-06-08 MED ORDER — SODIUM CHLORIDE 0.9 % IV SOLN
Freq: Once | INTRAVENOUS | Status: AC
  Administered 2013-06-08: 11:00:00 via INTRAVENOUS

## 2013-06-08 MED ORDER — DIPHENHYDRAMINE HCL 25 MG PO CAPS
50.0000 mg | ORAL_CAPSULE | Freq: Once | ORAL | Status: AC
  Administered 2013-06-08: 50 mg via ORAL

## 2013-06-08 MED ORDER — SODIUM CHLORIDE 0.9 % IJ SOLN
10.0000 mL | INTRAMUSCULAR | Status: DC | PRN
Start: 2013-06-08 — End: 2013-06-08
  Administered 2013-06-08: 10 mL
  Filled 2013-06-08: qty 10

## 2013-06-08 MED ORDER — ACETAMINOPHEN 325 MG PO TABS
650.0000 mg | ORAL_TABLET | Freq: Once | ORAL | Status: AC
  Administered 2013-06-08: 650 mg via ORAL

## 2013-06-08 MED ORDER — HEPARIN SOD (PORK) LOCK FLUSH 100 UNIT/ML IV SOLN
500.0000 [IU] | Freq: Once | INTRAVENOUS | Status: AC | PRN
  Administered 2013-06-08: 500 [IU]
  Filled 2013-06-08: qty 5

## 2013-06-08 MED ORDER — ACETAMINOPHEN 325 MG PO TABS
ORAL_TABLET | ORAL | Status: AC
  Filled 2013-06-08: qty 2

## 2013-06-08 NOTE — Patient Instructions (Signed)
[facility] Discharge Instructions for Patients Receiving Chemotherapy  Today you received the following chemotherapy agents Herceptin.  To help prevent nausea and vomiting after your treatment, we encourage you to take your nausea medication as prescribed.   If you develop nausea and vomiting that is not controlled by your nausea medication, call the clinic.   BELOW ARE SYMPTOMS THAT SHOULD BE REPORTED IMMEDIATELY:  *FEVER GREATER THAN 100.5 F  *CHILLS WITH OR WITHOUT FEVER  NAUSEA AND VOMITING THAT IS NOT CONTROLLED WITH YOUR NAUSEA MEDICATION  *UNUSUAL SHORTNESS OF BREATH  *UNUSUAL BRUISING OR BLEEDING  TENDERNESS IN MOUTH AND THROAT WITH OR WITHOUT PRESENCE OF ULCERS  *URINARY PROBLEMS  *BOWEL PROBLEMS  UNUSUAL RASH Items with * indicate a potential emergency and should be followed up as soon as possible.  Feel free to call the clinic you have any questions or concerns. The clinic phone number is [phone].    

## 2013-06-11 ENCOUNTER — Encounter: Payer: Self-pay | Admitting: Oncology

## 2013-06-11 NOTE — Progress Notes (Signed)
Put fmla form on nurse's desk °

## 2013-06-11 NOTE — Progress Notes (Signed)
Faxed fmla form to [phone] °

## 2013-06-28 ENCOUNTER — Ambulatory Visit (HOSPITAL_BASED_OUTPATIENT_CLINIC_OR_DEPARTMENT_OTHER): Payer: BC Managed Care – PPO | Admitting: Physician Assistant

## 2013-06-28 ENCOUNTER — Encounter: Payer: Self-pay | Admitting: Oncology

## 2013-06-28 ENCOUNTER — Ambulatory Visit (HOSPITAL_BASED_OUTPATIENT_CLINIC_OR_DEPARTMENT_OTHER): Payer: BC Managed Care – PPO

## 2013-06-28 ENCOUNTER — Encounter: Payer: Self-pay | Admitting: Physician Assistant

## 2013-06-28 ENCOUNTER — Other Ambulatory Visit: Payer: Self-pay | Admitting: Oncology

## 2013-06-28 ENCOUNTER — Other Ambulatory Visit (HOSPITAL_BASED_OUTPATIENT_CLINIC_OR_DEPARTMENT_OTHER): Payer: BC Managed Care – PPO

## 2013-06-28 ENCOUNTER — Telehealth: Payer: Self-pay | Admitting: Physician Assistant

## 2013-06-28 ENCOUNTER — Telehealth: Payer: Self-pay | Admitting: *Deleted

## 2013-06-28 VITALS — BP 127/92 | HR 88

## 2013-06-28 VITALS — BP 148/100 | HR 92 | Temp 97.8°F | Resp 18 | Ht 62.5 in | Wt 138.6 lb

## 2013-06-28 DIAGNOSIS — Z5112 Encounter for antineoplastic immunotherapy: Secondary | ICD-10-CM

## 2013-06-28 DIAGNOSIS — C50219 Malignant neoplasm of upper-inner quadrant of unspecified female breast: Secondary | ICD-10-CM

## 2013-06-28 DIAGNOSIS — I10 Essential (primary) hypertension: Secondary | ICD-10-CM

## 2013-06-28 DIAGNOSIS — L259 Unspecified contact dermatitis, unspecified cause: Secondary | ICD-10-CM

## 2013-06-28 DIAGNOSIS — F419 Anxiety disorder, unspecified: Secondary | ICD-10-CM | POA: Insufficient documentation

## 2013-06-28 DIAGNOSIS — J309 Allergic rhinitis, unspecified: Secondary | ICD-10-CM

## 2013-06-28 DIAGNOSIS — L309 Dermatitis, unspecified: Secondary | ICD-10-CM

## 2013-06-28 DIAGNOSIS — C50211 Malignant neoplasm of upper-inner quadrant of right female breast: Secondary | ICD-10-CM

## 2013-06-28 DIAGNOSIS — F411 Generalized anxiety disorder: Secondary | ICD-10-CM

## 2013-06-28 LAB — CBC WITH DIFFERENTIAL/PLATELET
BASO%: 0.5 % (ref 0.0–2.0)
Basophils Absolute: 0 10*3/uL (ref 0.0–0.1)
EOS ABS: 0.1 10*3/uL (ref 0.0–0.5)
EOS%: 2.6 % (ref 0.0–7.0)
HEMATOCRIT: 40.4 % (ref 34.8–46.6)
HEMOGLOBIN: 14.3 g/dL (ref 11.6–15.9)
LYMPH%: 32.5 % (ref 14.0–49.7)
MCH: 30.5 pg (ref 25.1–34.0)
MCHC: 35.4 g/dL (ref 31.5–36.0)
MCV: 86.1 fL (ref 79.5–101.0)
MONO#: 0.3 10*3/uL (ref 0.1–0.9)
MONO%: 6.7 % (ref 0.0–14.0)
NEUT%: 57.7 % (ref 38.4–76.8)
NEUTROS ABS: 2.4 10*3/uL (ref 1.5–6.5)
PLATELETS: 170 10*3/uL (ref 145–400)
RBC: 4.69 10*6/uL (ref 3.70–5.45)
RDW: 12.9 % (ref 11.2–14.5)
WBC: 4.2 10*3/uL (ref 3.9–10.3)
lymph#: 1.4 10*3/uL (ref 0.9–3.3)
nRBC: 0 % (ref 0–0)

## 2013-06-28 LAB — COMPREHENSIVE METABOLIC PANEL (CC13)
ALT: 13 U/L (ref 0–55)
AST: 15 U/L (ref 5–34)
Albumin: 3.8 g/dL (ref 3.5–5.0)
Alkaline Phosphatase: 65 U/L (ref 40–150)
Anion Gap: 10 mEq/L (ref 3–11)
BILIRUBIN TOTAL: 0.45 mg/dL (ref 0.20–1.20)
BUN: 12.1 mg/dL (ref 7.0–26.0)
CALCIUM: 9.3 mg/dL (ref 8.4–10.4)
CHLORIDE: 107 meq/L (ref 98–109)
CO2: 26 meq/L (ref 22–29)
CREATININE: 0.8 mg/dL (ref 0.6–1.1)
GLUCOSE: 93 mg/dL (ref 70–140)
Potassium: 3.7 mEq/L (ref 3.5–5.1)
Sodium: 143 mEq/L (ref 136–145)
Total Protein: 7.2 g/dL (ref 6.4–8.3)

## 2013-06-28 MED ORDER — CLONIDINE HCL 0.1 MG PO TABS
0.1000 mg | ORAL_TABLET | Freq: Once | ORAL | Status: AC
  Administered 2013-06-28: 0.1 mg via ORAL

## 2013-06-28 MED ORDER — DIPHENHYDRAMINE HCL 25 MG PO CAPS
ORAL_CAPSULE | ORAL | Status: AC
  Filled 2013-06-28: qty 2

## 2013-06-28 MED ORDER — DIPHENHYDRAMINE HCL 25 MG PO CAPS
50.0000 mg | ORAL_CAPSULE | Freq: Once | ORAL | Status: AC
  Administered 2013-06-28: 50 mg via ORAL

## 2013-06-28 MED ORDER — ACETAMINOPHEN 325 MG PO TABS
ORAL_TABLET | ORAL | Status: AC
  Filled 2013-06-28: qty 2

## 2013-06-28 MED ORDER — LORAZEPAM 0.5 MG PO TABS: 0.2500 mg | ORAL_TABLET | Freq: Two times a day (BID) | ORAL | Status: DC | PRN

## 2013-06-28 MED ORDER — ACETAMINOPHEN 325 MG PO TABS
650.0000 mg | ORAL_TABLET | Freq: Once | ORAL | Status: AC
  Administered 2013-06-28: 650 mg via ORAL

## 2013-06-28 MED ORDER — SODIUM CHLORIDE 0.9 % IJ SOLN
10.0000 mL | INTRAMUSCULAR | Status: DC | PRN
  Administered 2013-06-28: 10 mL
  Filled 2013-06-28: qty 10

## 2013-06-28 MED ORDER — CLONIDINE HCL 0.1 MG PO TABS
ORAL_TABLET | ORAL | Status: AC
  Filled 2013-06-28: qty 1

## 2013-06-28 MED ORDER — TRASTUZUMAB CHEMO INJECTION 440 MG
378.0000 mg | Freq: Once | INTRAVENOUS | Status: AC
  Administered 2013-06-28: 378 mg via INTRAVENOUS
  Filled 2013-06-28: qty 18

## 2013-06-28 MED ORDER — PIMECROLIMUS 1 % EX CREA: TOPICAL_CREAM | Freq: Two times a day (BID) | CUTANEOUS | Status: DC

## 2013-06-28 MED ORDER — SODIUM CHLORIDE 0.9 % IV SOLN
Freq: Once | INTRAVENOUS | Status: AC
  Administered 2013-06-28: 10:00:00 via INTRAVENOUS

## 2013-06-28 MED ORDER — HEPARIN SOD (PORK) LOCK FLUSH 100 UNIT/ML IV SOLN
500.0000 [IU] | Freq: Once | INTRAVENOUS | Status: AC | PRN
  Administered 2013-06-28: 500 [IU]
  Filled 2013-06-28: qty 5

## 2013-06-28 NOTE — Progress Notes (Signed)
ID: [name]   DOB: 06/18/1970  MR#: [id number]  TIR#:[id number]  PCP: Lynne Logan, MD GYN: Everett Graff. MD SU: Erroll Luna. 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 At that time a digital bilateal diagnostic mammogram showed a slight decrease in size of the benign fibroadenoma in the left breast compared to 2010. The same mammogram, however, also refilled pleomorphic calcifications extending over 9 cm in the inner third of the right breast. There were no suspicious calcifications in the left breast. On exam, Dr. Purcell Nails was able to palpate a small freely mobile mass in the lower inner left breast, but was unable to palpate any abnormalities in the right breast other than some focal thickening.  An subsequent ultrasound of the right breast on 06/16/2012 showed multiple hypoechoic masses throughout the upper quadrants of the right breast. The palpable mass in the 1:00 location corresponded with an irregular hypoechoic mass measuring 1.7 x 1.4 cm. There was also a discrete hypoechoic lobulated mass at the 11:00 position measuring 2.1 x 1.1 x 2.1 cm. The third lesion identified at the 11:00 location, 3 cm from the nipple, measured 1.0 x 0.8 x 0.9 cm.   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  Bilateral breast MRI 06/23/2012 showed an enhancing mass in the upper central right breast measuring 1.8 cm, together with clumped nodular enhancement extending for a total area of 11.4 cm. In the upper  outer quadrant of this breast there were 3 discrete irregular masses measuring up to 3 cm. This correlated well with the ultrasound findings previously. In the left breast there was a large area of clumped nodular enhancement measuring up to 8.9 cm. In the lower central portion there was a 1.7 cm circumscribed nodule consistent with a known fibroadenoma. There weas no axillary or internal mammary adenopathy noted on either side.   The 8.9 cm area of abnormality in the left breast was biopsied on 06/30/2012 showing fibrocystic changes with adenosis and calcifications, pseudoangiomatous stromal hyperplasia, but no evidence of malignancy.  The patient underwent bilateral mastectomies under the care of Dr. Brantley Stage on 07/14/2012. The left mastectomy revealed benign breast tissue and was negative for malignancy. 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 Lymphovascular invasion was identified; #2) a ductal carcinoma in situ, grade 3, with comedo necrosis.  Surgical margins were negative. 4 lymph nodes were examined, one positive for metastatic mammary carcinoma, a second positive for isolated tumor cells. pT2, pN1a  Subsequent history is detailed below.  INTERVAL HISTORY: Sherry Barry returns alone today for followup of her right breast cancer. She continues to receive trastuzumab every 3 weeks, and is due for her next dose today. After today, she has only 3 doses remaining, completing one year of therapy in March. She's also on tamoxifen which she started in mid September 2014. She is following both of these agents well.   [name] has no significant hot  flashes. She denies any increased joint pain. She's had no change in vision. She denies any peripheral swelling, abnormal bleeding, or abnormal clotting. She's had no increased shortness of breath, and denies any orthopnea.  [name] is under quite a bit of stress.  Their dealing with some health issues associated with her 38-year-old son, and this is causing her quite a bit of anxiety. This is probably contributing to her hypertension which is poorly controlled today. She has taken low dose lorazepam successfully in the past.  REVIEW OF SYSTEMS: [name]'s energy level is fairly good.  She is working full-time. she's eating and drinking well denies any nausea or change in bowel or bladder habits. She's had no cough, phlegm production, chest pain, or palpitations. She has some runny nose associated with seasonal allergies. She has a rash on her neck and inside her elbows, diagnosed in the past as eczema. She's had no additional skin changes elsewhere. She denies any abnormal headaches or dizziness. She sometimes feels sore in her right chest wall and feels like she has "pulled a muscle". This pain does increase with movement. It does not worsen with a deep breath. He comes and goes. She currently denies any additional myalgias, arthralgias, or bony pain.    A detailed review of systems is otherwise stable and noncontributory.   PAST MEDICAL HISTORY: Past Medical History  Diagnosis Date  . Allergy   . Migraine   . Migraine   . History of migraine     last one about a week ago  . History of chemotherapy     doxetaxel/carboplatin/trastuzumab  . Breast cancer   . Breast cancer     right  . Hypertension   . Hx of radiation therapy 01/01/13- 02/15/13    r chest wall, r supraclav/axilla 5040 cGy/28 sessions, scar boost 1000 cGy/5 sessions    PAST SURGICAL HISTORY: Past Surgical History  Procedure Laterality Date  . Simple mastectomy with axillary sentinel node biopsy  07/14/2012    Procedure: SIMPLE MASTECTOMY WITH AXILLARY SENTINEL NODE BIOPSY;  Surgeon: Joyice Faster. Cornett, MD;  Location: [facility];  Service: General;  Laterality: Right;  Bilateral simple mastectomy with port and right sebtibel lymph node mapping  . Simple mastectomy with axillary sentinel node  biopsy  07/14/2012    Procedure: SIMPLE MASTECTOMY;  Surgeon: Joyice Faster. Cornett, MD;  Location: [facility];  Service: General;  Laterality: Left;  . Portacath placement  07/14/2012    Procedure: INSERTION PORT-A-CATH;  Surgeon: Joyice Faster. Cornett, MD;  Location: [facility];  Service: General;  Laterality: Left;  . Abdominal hysterectomy      no salpingo-oophorectomy    FAMILY HISTORY Family History  Problem Relation Age of Onset  . Hypertension Mother   . Alcohol abuse Mother   . Heart disease Maternal Grandmother   . Stroke Maternal Grandfather   . Colon cancer Maternal Aunt 37    alive at 32  . Brain cancer Maternal Uncle 38    and lymphoma in early 20s; deceased  . Brain cancer Maternal Uncle 60    deceased  . Pancreatic cancer Maternal Uncle 45    alive at 67  . Breast cancer Maternal Aunt     great aunt through mat GF; dx at ? age  . Breast cancer Cousin 23    mat 1st cousin once removed through mat GF ; deceased   the patient's father died in an automobile accident in his early 28s. The patient's mother is living, currently age  39. The patient has one brother and one sister. There is no family history of breast or ovarian cancer, although there are maternal uncles with brain, colon and lymph node cancers.  GYNECOLOGIC HISTORY: Menarche age 89, first live birth age 12, she is Terminous P2. She stopped having periods with her surgery March of 2009. She does have "premenstrual symptoms" on a regular basis.  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 Her husband Harless Nakayama works for a company in the [place] area doing testing. Their children Jaden (10) and Bryson (5) at home.   ADVANCED DIRECTIVES: not in place  HEALTH MAINTENANCE:  (Updated January 2015) History  Substance Use Topics  . Smoking status: Never Smoker   . Smokeless tobacco: Never Used  . Alcohol Use: Yes     Comment: occasional      Colonoscopy: Never  PAP: 09/2010, Dr. Mancel Bale  Bone density: Never  Lipid panel:  Not on file, Dr. Nancy Fetter   Allergies  Allergen Reactions  . Codeine Itching  . Latex Swelling    Current Outpatient Prescriptions  Medication Sig Dispense Refill  . lidocaine-prilocaine (EMLA) cream Apply topically as needed. Apply to port 1-2 hours before chemo and cover with plastic wrap  30 g  0  . mometasone (NASONEX) 50 MCG/ACT nasal spray Place 2 sprays into the nose daily.      . tamoxifen (NOLVADEX) 20 MG tablet Take 20 mg by mouth daily.      . trastuzumab (HERCEPTIN) 440 MG injection Infuse into the vein See Admin Instructions. Once every 3weeks through port      . triamcinolone (NASACORT) 55 MCG/ACT AERO nasal inhaler 2 sprays.      [name] LORazepam (ATIVAN) 0.5 MG tablet Take 0.5-1 tablets (0.25-0.5 mg total) by mouth 2 (two) times daily as needed for anxiety (Nausea or vomiting).  30 tablet  0  . pimecrolimus (ELIDEL) 1 % cream Apply topically 2 (two) times daily.  60 g  1   No current facility-administered medications for this visit.    OBJECTIVE: Young-appearing African American woman  appears somewhat anxious but is in no acute distress Filed Vitals:   06/28/13 0836  BP: 148/100  Pulse: 92  Temp: 97.8 F (36.6 C)  Resp: 18     Body mass index is 24.93 kg/(m^2).    ECOG FS: 0 Filed Weights   06/28/13 0836  Weight: 138 lb 9.6 oz (62.869 kg)   Physical Exam: HEENT:  Sclerae anicteric.  Oropharynx clear, pain, and moist. Neck is supple. Trachea midline with no thyromegaly palpated.   NODES:  No cervical or supraclavicular lymphadenopathy palpated.  BREAST EXAM:  Patient status post bilateral mastectomies. No evidence of local recurrence on the right. Axillae are benign bilaterally, with no palpable lymphadenopathy. LUNGS:  Clear to auscultation bilaterally with good excursion.  No wheezes or rhonchi. HEART:  Regular rate and rhythm. No murmur appreciated ABDOMEN:  Soft, nontender to  palpation .  Positive bowel sounds.  MSK:  No focal spinal tenderness to palpation. Full range of motion in the upper extremities.  EXTREMITIES:  No peripheral edema.   SKIN:  Dry and scaly erythematous rash on the lower right neck and in the antecubital fossae bilaterally.  Port is intact in the left upper chest wall, no erythema, edema, or evidence of infection/cellulitis . NEURO:  Nonfocal. Well oriented.  Anxious affect.    LAB RESULTS: Lab Results  Component Value Date  WBC 4.2 06/28/2013   NEUTROABS 2.4 06/28/2013   HGB 14.3 06/28/2013   HCT 40.4 06/28/2013   MCV 86.1 06/28/2013   PLT 170 06/28/2013      Chemistry      Component Value Date/Time   NA 143 06/28/2013 0822   NA 135 07/15/2012 0500   K 3.7 06/28/2013 0822   K 3.9 07/15/2012 0500   CL 101 11/15/2012 0904   CL 103 07/15/2012 0500   CO2 26 06/28/2013 0822   CO2 25 07/15/2012 0500   BUN 12.1 06/28/2013 0822   BUN 7 07/15/2012 0500   CREATININE 0.8 06/28/2013 0822   CREATININE 0.62 07/15/2012 0500   CREATININE 0.80 05/02/2012 1412      Component Value Date/Time   CALCIUM 9.3 06/28/2013 0822   CALCIUM 8.2* 07/15/2012 0500   ALKPHOS 65 06/28/2013 0822   ALKPHOS 46 07/14/2012 2300   AST 15 06/28/2013 0822   AST 14 07/14/2012 2300   ALT 13 06/28/2013 0822   ALT 7 07/14/2012 2300   BILITOT 0.45 06/28/2013 0822   BILITOT 0.3 07/14/2012 2300       STUDIES:  Echocardiogram 04/30/2013 showed an ejection fraction of 60 - 65 %.      ASSESSMENT: 43 y.o. [place] woman   (1)  status post right mastectomy and sentinel lymph node sampling 07/14/2012 (with simple left mastectomy)  for a pT2, pN1, stage IIB invasive ductal carcinoma, grade 3, estrogen receptor 100% and progesterone receptor 100% positive, with an MIB-1 of 48%, and HER-2 amplified by CISH with a ratio of 3.02.  (2) adjuvant chemotherapy started 08/17/2012, consisting of carboplatin, docetaxel and trastuzumab given every 3 weeks for 6 cycles, completed 11/30/2012  (3)  trastuzumab to be continued to March of 2015. Most recent echocardiogram 10/30/2012  (4)  Status post radiation therapy, completed in early September  (5) started on tamoxifen in mid September 2014  (6) Eczema, treated with Elidel twice daily  (7)  Anxiety, lorazepam 0.5 mg twice a day as needed    PLAN:  Shelva  is doing very well with regards to her breast cancer, and I'm making no changes to her current regimen. She will continue on the tamoxifen which she is tolerating well. After today, she has 3 remaining doses of trastuzumab, the last of which will be March 19. When she completes trastuzumab we will repeat one final echocardiogram in late March, after which she will see [place] for labs and physical exam to discuss long-term followup.   Azelie does have her son in counseling, and we discussed the possibility of some counseling for her as well. I did refill her lorazepam today to take up to twice daily as needed for anxiety.  I have also prescribed Elidel cream to be applied to apparent eczema on the neck and arms twice daily. If the rash worsens, or if it does not improve, she will contact either her primary care physician or a dermatologist for further evaluation.  With regards to her hypertension, she will be given a low dose of clonidine, 0.1 mg, today with her premeds. She does need to followup with her primary care physician to make sure this is better controlled.    Tyreanna voices her understanding and agreement with the above plan. She will call with any changes or problems prior to her next appointment here.   Ellison Leisure PA-C    06/28/2013

## 2013-06-28 NOTE — Patient Instructions (Signed)
[facility] Discharge Instructions for Patients Receiving Chemotherapy  Today you received the following chemotherapy agents herceptin.  To help prevent nausea and vomiting after your treatment, we encourage you to take your nausea medication as prescribed.   If you develop nausea and vomiting that is not controlled by your nausea medication, call the clinic.   BELOW ARE SYMPTOMS THAT SHOULD BE REPORTED IMMEDIATELY:  *FEVER GREATER THAN 100.5 F  *CHILLS WITH OR WITHOUT FEVER  NAUSEA AND VOMITING THAT IS NOT CONTROLLED WITH YOUR NAUSEA MEDICATION  *UNUSUAL SHORTNESS OF BREATH  *UNUSUAL BRUISING OR BLEEDING  TENDERNESS IN MOUTH AND THROAT WITH OR WITHOUT PRESENCE OF ULCERS  *URINARY PROBLEMS  *BOWEL PROBLEMS  UNUSUAL RASH Items with * indicate a potential emergency and should be followed up as soon as possible.  Feel free to call the clinic you have any questions or concerns. The clinic phone number is [phone].    

## 2013-06-28 NOTE — Telephone Encounter (Signed)
Per staff message and POF I have scheduled appts.  JMW  

## 2013-07-19 ENCOUNTER — Ambulatory Visit (HOSPITAL_BASED_OUTPATIENT_CLINIC_OR_DEPARTMENT_OTHER): Payer: BC Managed Care – PPO

## 2013-07-19 ENCOUNTER — Other Ambulatory Visit (HOSPITAL_BASED_OUTPATIENT_CLINIC_OR_DEPARTMENT_OTHER): Payer: BC Managed Care – PPO

## 2013-07-19 VITALS — BP 110/81 | HR 88 | Temp 98.0°F | Resp 20

## 2013-07-19 DIAGNOSIS — C50219 Malignant neoplasm of upper-inner quadrant of unspecified female breast: Secondary | ICD-10-CM

## 2013-07-19 DIAGNOSIS — C50211 Malignant neoplasm of upper-inner quadrant of right female breast: Secondary | ICD-10-CM

## 2013-07-19 DIAGNOSIS — Z5189 Encounter for other specified aftercare: Secondary | ICD-10-CM

## 2013-07-19 LAB — CBC WITH DIFFERENTIAL/PLATELET
BASO%: 0.7 % (ref 0.0–2.0)
Basophils Absolute: 0 10*3/uL (ref 0.0–0.1)
EOS%: 2.3 % (ref 0.0–7.0)
Eosinophils Absolute: 0.1 10*3/uL (ref 0.0–0.5)
HEMATOCRIT: 41.8 % (ref 34.8–46.6)
HGB: 14.8 g/dL (ref 11.6–15.9)
LYMPH%: 29.7 % (ref 14.0–49.7)
MCH: 30.6 pg (ref 25.1–34.0)
MCHC: 35.4 g/dL (ref 31.5–36.0)
MCV: 86.4 fL (ref 79.5–101.0)
MONO#: 0.3 10*3/uL (ref 0.1–0.9)
MONO%: 6.6 % (ref 0.0–14.0)
NEUT#: 2.7 10*3/uL (ref 1.5–6.5)
NEUT%: 60.7 % (ref 38.4–76.8)
PLATELETS: 171 10*3/uL (ref 145–400)
RBC: 4.84 10*6/uL (ref 3.70–5.45)
RDW: 12.5 % (ref 11.2–14.5)
WBC: 4.4 10*3/uL (ref 3.9–10.3)
lymph#: 1.3 10*3/uL (ref 0.9–3.3)
nRBC: 0 % (ref 0–0)

## 2013-07-19 MED ORDER — SODIUM CHLORIDE 0.9 % IJ SOLN
10.0000 mL | INTRAMUSCULAR | Status: DC | PRN
  Administered 2013-07-19: 10 mL
  Filled 2013-07-19: qty 10

## 2013-07-19 MED ORDER — ACETAMINOPHEN 325 MG PO TABS
650.0000 mg | ORAL_TABLET | Freq: Once | ORAL | Status: AC
  Administered 2013-07-19: 650 mg via ORAL

## 2013-07-19 MED ORDER — DIPHENHYDRAMINE HCL 25 MG PO CAPS
50.0000 mg | ORAL_CAPSULE | Freq: Once | ORAL | Status: AC
  Administered 2013-07-19: 50 mg via ORAL

## 2013-07-19 MED ORDER — ACETAMINOPHEN 325 MG PO TABS
ORAL_TABLET | ORAL | Status: AC
  Filled 2013-07-19: qty 2

## 2013-07-19 MED ORDER — DIPHENHYDRAMINE HCL 25 MG PO CAPS
ORAL_CAPSULE | ORAL | Status: AC
  Filled 2013-07-19: qty 2

## 2013-07-19 MED ORDER — HEPARIN SOD (PORK) LOCK FLUSH 100 UNIT/ML IV SOLN
500.0000 [IU] | Freq: Once | INTRAVENOUS | Status: AC | PRN
  Administered 2013-07-19: 500 [IU]
  Filled 2013-07-19: qty 5

## 2013-07-19 MED ORDER — CLONIDINE HCL 0.1 MG PO TABS
0.1000 mg | ORAL_TABLET | Freq: Once | ORAL | Status: AC
  Administered 2013-07-19: 0.1 mg via ORAL

## 2013-07-19 MED ORDER — TRASTUZUMAB CHEMO INJECTION 440 MG
378.0000 mg | Freq: Once | INTRAVENOUS | Status: AC
  Administered 2013-07-19: 378 mg via INTRAVENOUS
  Filled 2013-07-19: qty 18

## 2013-07-19 MED ORDER — SODIUM CHLORIDE 0.9 % IV SOLN
Freq: Once | INTRAVENOUS | Status: AC
  Administered 2013-07-19: 09:00:00 via INTRAVENOUS

## 2013-07-19 NOTE — Patient Instructions (Signed)
[facility] Discharge Instructions for Patients Receiving Chemotherapy  Today you received the following chemotherapy agents :  Herceptin.  To help prevent nausea and vomiting after your treatment, we encourage you to take your nausea medication as instructed by your physician.   If you develop nausea and vomiting that is not controlled by your nausea medication, call the clinic.   BELOW ARE SYMPTOMS THAT SHOULD BE REPORTED IMMEDIATELY:  *FEVER GREATER THAN 100.5 F  *CHILLS WITH OR WITHOUT FEVER  NAUSEA AND VOMITING THAT IS NOT CONTROLLED WITH YOUR NAUSEA MEDICATION  *UNUSUAL SHORTNESS OF BREATH  *UNUSUAL BRUISING OR BLEEDING  TENDERNESS IN MOUTH AND THROAT WITH OR WITHOUT PRESENCE OF ULCERS  *URINARY PROBLEMS  *BOWEL PROBLEMS  UNUSUAL RASH Items with * indicate a potential emergency and should be followed up as soon as possible.  Feel free to call the clinic you have any questions or concerns. The clinic phone number is [phone].    

## 2013-08-02 ENCOUNTER — Ambulatory Visit (HOSPITAL_COMMUNITY): Payer: BC Managed Care – PPO

## 2013-08-02 ENCOUNTER — Encounter (HOSPITAL_COMMUNITY): Payer: BC Managed Care – PPO

## 2013-08-09 ENCOUNTER — Ambulatory Visit (HOSPITAL_BASED_OUTPATIENT_CLINIC_OR_DEPARTMENT_OTHER): Payer: BC Managed Care – PPO

## 2013-08-09 ENCOUNTER — Other Ambulatory Visit (HOSPITAL_BASED_OUTPATIENT_CLINIC_OR_DEPARTMENT_OTHER): Payer: BC Managed Care – PPO

## 2013-08-09 VITALS — BP 127/82 | HR 95 | Temp 98.0°F | Resp 18

## 2013-08-09 DIAGNOSIS — C50211 Malignant neoplasm of upper-inner quadrant of right female breast: Secondary | ICD-10-CM

## 2013-08-09 DIAGNOSIS — C50219 Malignant neoplasm of upper-inner quadrant of unspecified female breast: Secondary | ICD-10-CM

## 2013-08-09 DIAGNOSIS — C773 Secondary and unspecified malignant neoplasm of axilla and upper limb lymph nodes: Secondary | ICD-10-CM

## 2013-08-09 DIAGNOSIS — Z5112 Encounter for antineoplastic immunotherapy: Secondary | ICD-10-CM

## 2013-08-09 LAB — COMPREHENSIVE METABOLIC PANEL (CC13)
ALBUMIN: 3.2 g/dL — AB (ref 3.5–5.0)
ALT: 11 U/L (ref 0–55)
AST: 16 U/L (ref 5–34)
Alkaline Phosphatase: 55 U/L (ref 40–150)
Anion Gap: 8 mEq/L (ref 3–11)
BUN: 14.8 mg/dL (ref 7.0–26.0)
CALCIUM: 8.5 mg/dL (ref 8.4–10.4)
CHLORIDE: 112 meq/L — AB (ref 98–109)
CO2: 25 mEq/L (ref 22–29)
Creatinine: 0.8 mg/dL (ref 0.6–1.1)
Glucose: 80 mg/dl (ref 70–140)
POTASSIUM: 4 meq/L (ref 3.5–5.1)
Sodium: 144 mEq/L (ref 136–145)
Total Bilirubin: 0.31 mg/dL (ref 0.20–1.20)
Total Protein: 5.9 g/dL — ABNORMAL LOW (ref 6.4–8.3)

## 2013-08-09 LAB — CBC WITH DIFFERENTIAL/PLATELET
BASO%: 0.2 % (ref 0.0–2.0)
BASOS ABS: 0 10*3/uL (ref 0.0–0.1)
EOS%: 1.9 % (ref 0.0–7.0)
Eosinophils Absolute: 0.1 10*3/uL (ref 0.0–0.5)
HCT: 39.8 % (ref 34.8–46.6)
HEMOGLOBIN: 14 g/dL (ref 11.6–15.9)
LYMPH%: 33.4 % (ref 14.0–49.7)
MCH: 30.2 pg (ref 25.1–34.0)
MCHC: 35.2 g/dL (ref 31.5–36.0)
MCV: 86 fL (ref 79.5–101.0)
MONO#: 0.2 10*3/uL (ref 0.1–0.9)
MONO%: 5.3 % (ref 0.0–14.0)
NEUT#: 2.5 10*3/uL (ref 1.5–6.5)
NEUT%: 59.2 % (ref 38.4–76.8)
Platelets: 165 10*3/uL (ref 145–400)
RBC: 4.63 10*6/uL (ref 3.70–5.45)
RDW: 12.2 % (ref 11.2–14.5)
WBC: 4.2 10*3/uL (ref 3.9–10.3)
lymph#: 1.4 10*3/uL (ref 0.9–3.3)
nRBC: 0 % (ref 0–0)

## 2013-08-09 MED ORDER — SODIUM CHLORIDE 0.9 % IJ SOLN
10.0000 mL | INTRAMUSCULAR | Status: DC | PRN
  Administered 2013-08-09: 10 mL
  Filled 2013-08-09: qty 10

## 2013-08-09 MED ORDER — SODIUM CHLORIDE 0.9 % IV SOLN
Freq: Once | INTRAVENOUS | Status: AC
  Administered 2013-08-09: 08:00:00 via INTRAVENOUS

## 2013-08-09 MED ORDER — CLONIDINE HCL 0.1 MG PO TABS
0.1000 mg | ORAL_TABLET | Freq: Once | ORAL | Status: AC
  Administered 2013-08-09: 0.1 mg via ORAL

## 2013-08-09 MED ORDER — DIPHENHYDRAMINE HCL 25 MG PO CAPS
50.0000 mg | ORAL_CAPSULE | Freq: Once | ORAL | Status: AC
  Administered 2013-08-09: 50 mg via ORAL

## 2013-08-09 MED ORDER — ACETAMINOPHEN 325 MG PO TABS
650.0000 mg | ORAL_TABLET | Freq: Once | ORAL | Status: AC
Start: 2013-08-09 — End: 2013-08-09
  Administered 2013-08-09: 650 mg via ORAL

## 2013-08-09 MED ORDER — ACETAMINOPHEN 325 MG PO TABS
ORAL_TABLET | ORAL | Status: AC
  Filled 2013-08-09: qty 2

## 2013-08-09 MED ORDER — DIPHENHYDRAMINE HCL 25 MG PO CAPS
ORAL_CAPSULE | ORAL | Status: AC
  Filled 2013-08-09: qty 2

## 2013-08-09 MED ORDER — TRASTUZUMAB CHEMO INJECTION 440 MG
378.0000 mg | Freq: Once | INTRAVENOUS | Status: AC
  Administered 2013-08-09: 378 mg via INTRAVENOUS
  Filled 2013-08-09: qty 18

## 2013-08-09 MED ORDER — CLONIDINE HCL 0.1 MG PO TABS
ORAL_TABLET | ORAL | Status: AC
Start: 2013-08-09 — End: 2013-08-09
  Filled 2013-08-09: qty 1

## 2013-08-09 MED ORDER — HEPARIN SOD (PORK) LOCK FLUSH 100 UNIT/ML IV SOLN
500.0000 [IU] | Freq: Once | INTRAVENOUS | Status: AC | PRN
  Administered 2013-08-09: 500 [IU]
  Filled 2013-08-09: qty 5

## 2013-08-09 NOTE — Patient Instructions (Signed)
[facility] Discharge Instructions for Patients Receiving Chemotherapy  Today you received the following chemotherapy agents: Herceptin  To help prevent nausea and vomiting after your treatment, we encourage you to take your nausea medication as prescribed by your physician.    If you develop nausea and vomiting that is not controlled by your nausea medication, call the clinic.   BELOW ARE SYMPTOMS THAT SHOULD BE REPORTED IMMEDIATELY:  *FEVER GREATER THAN 100.5 F  *CHILLS WITH OR WITHOUT FEVER  NAUSEA AND VOMITING THAT IS NOT CONTROLLED WITH YOUR NAUSEA MEDICATION  *UNUSUAL SHORTNESS OF BREATH  *UNUSUAL BRUISING OR BLEEDING  TENDERNESS IN MOUTH AND THROAT WITH OR WITHOUT PRESENCE OF ULCERS  *URINARY PROBLEMS  *BOWEL PROBLEMS  UNUSUAL RASH Items with * indicate a potential emergency and should be followed up as soon as possible.  Feel free to call the clinic you have any questions or concerns. The clinic phone number is [phone].    

## 2013-08-10 ENCOUNTER — Encounter (HOSPITAL_COMMUNITY): Payer: Self-pay | Admitting: *Deleted

## 2013-08-17 ENCOUNTER — Encounter (INDEPENDENT_AMBULATORY_CARE_PROVIDER_SITE_OTHER): Payer: Self-pay | Admitting: Surgery

## 2013-08-17 ENCOUNTER — Ambulatory Visit (INDEPENDENT_AMBULATORY_CARE_PROVIDER_SITE_OTHER): Payer: BC Managed Care – PPO | Admitting: Surgery

## 2013-08-17 VITALS — BP 118/82 | HR 72 | Temp 98.5°F | Resp 16 | Ht 62.5 in | Wt 137.6 lb

## 2013-08-17 DIAGNOSIS — Z853 Personal history of malignant neoplasm of breast: Secondary | ICD-10-CM

## 2013-08-17 NOTE — Patient Instructions (Signed)
Return 6 months.  Port removal when oncology says.

## 2013-08-17 NOTE — Progress Notes (Signed)
NAME: Sherry Barry       DOB: [birth year]           DATE: 08/17/2013        MRN: [id number]  CC:   Chief Complaint  Patient presents with  . Breast Cancer Long Term Follow Up    LTFU 6 mo breast recheck    [name] is a 43 y.o.Marland Kitchenfemale 43 y.o. [place] woman status post right mastectomy and sentinel lymph node sampling 07/14/2012 (with simple left mastectomy) for a pT2, pN1, stage IIB invasive ductal carcinoma, grade 3, estrogen receptor 100% and progesterone receptor 100% positive, with an MIB-1 of 48%, and HER-2 amplified by CISH with a ratio of 3.02.  Finished radiation therapy in September.  Wants to start reconstruction soon.      PFSH: She has had no significant changes since the last visit here.  ROS: There have been no significant changes since the last visit here  EXAM:  VS: BP 118/82  Pulse 72  Temp(Src) 98.5 F (36.9 C) (Oral)  Resp 16  Ht 5' 2.5" (1.588 m)  Wt 137 lb 9.6 oz (62.415 kg)  BMI 24.75 kg/m2  General: The patient is alert, oriented, generally healthy appearing, NAD. Mood and affect are normal.  Breasts:  both breasts surgically absent.  No chest wall masses. Port in place left subclavian.   Lymphatics: She has no axillary or supraclavicular adenopathy on either side.  Extremities: Full ROM of the surgical side with no lymphedema noted.  Data Reviewed: Notes from oncology   Impression:   43 y.o.  woman status post bilateral  mastectomy and sentinel lymph node sampling 07/14/2012 (with simple left mastectomy) for a pT2, pN1, stage IIB invasive ductal carcinoma, grade 3, estrogen receptor 100% and progesterone receptor 100% positive, with an MIB-1 of 48%, and HER-2 amplified by CISH with a ratio of 3.02.    Finishing herceptin   Plan: Will continue to follow up on an biannual basis here. Needs port out once ok with oncology.

## 2013-08-27 ENCOUNTER — Ambulatory Visit (INDEPENDENT_AMBULATORY_CARE_PROVIDER_SITE_OTHER): Payer: BC Managed Care – PPO | Admitting: Family Medicine

## 2013-08-27 VITALS — BP 138/82 | HR 90 | Temp 98.5°F | Resp 18 | Ht 63.0 in | Wt 137.0 lb

## 2013-08-27 DIAGNOSIS — S139XXA Sprain of joints and ligaments of unspecified parts of neck, initial encounter: Secondary | ICD-10-CM

## 2013-08-27 DIAGNOSIS — S161XXA Strain of muscle, fascia and tendon at neck level, initial encounter: Secondary | ICD-10-CM

## 2013-08-27 MED ORDER — DICLOFENAC SODIUM 75 MG PO TBEC: 75.0000 mg | DELAYED_RELEASE_TABLET | Freq: Two times a day (BID) | ORAL | Status: DC

## 2013-08-27 NOTE — Progress Notes (Signed)
This chart was scribed for Robyn Haber, MD by Einar Pheasant, ED Scribe. This patient was seen in room 4 and the patient's care was started at 6:18 PM.   Patient ID: Sherry Barry MRN: [id number], DOB: 07-Oct-1970, 43 y.o. Date of Encounter: 08/27/2013, 6:16 PM  Primary Physician: Lynne Logan, MD  Chief Complaint:  Chief Complaint  Patient presents with   Neck Pain    left side, 3 weeks   Otalgia    left     HPI: 43 y.o. year old female who is undergoing breast cancer treatments with history below presents to [facility] complaining of intermittent neck pain that started 3 weeks ago. Pt states that the pain in mainly located behind her left ear and radiates down her neck. She is able to move her neck but with pain. Pt has full range of motion of her arms. She reports taking Tylenol with not relief. Denies any abdominal pain, fever, chills, diaphoresis, or nausea.   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   Past Medical History  Diagnosis Date   Allergy    Migraine    Migraine    History of migraine     last one about a week ago   History of chemotherapy     doxetaxel/carboplatin/trastuzumab   Breast cancer    Breast cancer     right   Hypertension    Hx of radiation therapy 01/01/13- 02/15/13    r chest wall, r supraclav/axilla 5040 cGy/28 sessions, scar boost 1000 cGy/5 sessions     Home Meds: Prior to Admission medications   Medication Sig Start Date End Date Taking? Authorizing Provider  lidocaine-prilocaine (EMLA) cream Apply topically as needed. Apply to port 1-2 hours before chemo and cover with plastic wrap 08/03/12  Yes Chauncey Cruel, MD  LORazepam (ATIVAN) 0.5 MG tablet Take 0.5-1 tablets (0.25-0.5 mg total) by mouth 2 (two) times daily as needed for anxiety (Nausea or vomiting). 06/28/13  Yes Amy G Berry, PA-C  mometasone (NASONEX) 50 MCG/ACT nasal spray Place 2 sprays into the nose daily. 05/29/12  Yes Ellison Carwin,  MD  pimecrolimus (ELIDEL) 1 % cream Apply topically 2 (two) times daily. 06/28/13  Yes Amy Milda Smart, PA-C  tamoxifen (NOLVADEX) 20 MG tablet Take 20 mg by mouth daily. 02/22/13  Yes Amy Milda Smart, PA-C  trastuzumab (HERCEPTIN) 440 MG injection Infuse into the vein See Admin Instructions. 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 Constitutional: negative for chills, fever, night sweats, weight changes, or fatigue  HEENT: negative for vision changes, hearing loss, congestion, rhinorrhea, ST, epistaxis, or sinus pressure Cardiovascular: negative for chest pain or  palpitations Respiratory: negative for hemoptysis, wheezing, shortness of breath, or cough Abdominal: negative for abdominal pain, nausea, vomiting, diarrhea, or constipation Dermatological: negative for rash Neurologic: negative for headache, dizziness, or syncope All other systems reviewed and are otherwise negative with the exception to those above and in the HPI.   Physical Exam:  FROM of neck HEENT unremarkable Neck supple FROM of both upper extremities with no muscle wasting Blood pressure 138/82, pulse 90, temperature 98.5 F (36.9 C), resp. rate 18, height 5\' 3"  (1.6 m), weight 137 lb (62.143 kg), SpO2 98.00%., Body mass index is 24.27 kg/(m^2). General: Well developed, well nourished, in  no acute distress. Head: Normocephalic, atraumatic, eyes without discharge, sclera non-icteric, nares are without discharge. Bilateral auditory canals clear, TM's are without perforation, pearly grey and translucent with reflective cone of light bilaterally. Oral cavity moist, posterior pharynx without exudate, erythema, peritonsillar abscess, or post nasal drip.  Neck: Supple. No thyromegaly. Full ROM. No lymphadenopathy. Lungs: Clear bilaterally to auscultation without wheezes, rales, or rhonchi. Breathing is unlabored. Heart: RRR with S1 S2. No murmurs, rubs, or gallops appreciated. Abdomen: Soft, non-tender, non-distended with normoactive bowel sounds. No hepatomegaly. No rebound/guarding. No obvious abdominal masses. Msk:  Strength and tone normal for age. Extremities/Skin: Warm and dry. No clubbing or cyanosis. No edema. No rashes or suspicious lesions. Neuro: Alert and oriented X 3. Moves all extremities spontaneously. Gait is normal. CNII-XII grossly in tact. Psych:  Responds to questions appropriately with a normal affect.   Labs:   ASSESSMENT AND PLAN:  43 y.o. year old female with Neck strain - Plan: diclofenac (VOLTAREN) 75 MG EC tablet     Signed, Robyn Haber, MD 08/27/2013 6:16 PM

## 2013-08-30 ENCOUNTER — Ambulatory Visit (HOSPITAL_BASED_OUTPATIENT_CLINIC_OR_DEPARTMENT_OTHER): Payer: BC Managed Care – PPO

## 2013-08-30 ENCOUNTER — Other Ambulatory Visit (HOSPITAL_BASED_OUTPATIENT_CLINIC_OR_DEPARTMENT_OTHER): Payer: BC Managed Care – PPO

## 2013-08-30 VITALS — BP 150/111 | HR 101 | Temp 97.7°F | Resp 18

## 2013-08-30 DIAGNOSIS — C773 Secondary and unspecified malignant neoplasm of axilla and upper limb lymph nodes: Secondary | ICD-10-CM

## 2013-08-30 DIAGNOSIS — C50219 Malignant neoplasm of upper-inner quadrant of unspecified female breast: Secondary | ICD-10-CM

## 2013-08-30 DIAGNOSIS — Z5112 Encounter for antineoplastic immunotherapy: Secondary | ICD-10-CM

## 2013-08-30 DIAGNOSIS — C50211 Malignant neoplasm of upper-inner quadrant of right female breast: Secondary | ICD-10-CM

## 2013-08-30 LAB — CBC WITH DIFFERENTIAL/PLATELET
BASO%: 0.5 % (ref 0.0–2.0)
Basophils Absolute: 0 10*3/uL (ref 0.0–0.1)
EOS%: 2.1 % (ref 0.0–7.0)
Eosinophils Absolute: 0.1 10*3/uL (ref 0.0–0.5)
HCT: 41.3 % (ref 34.8–46.6)
HGB: 14.5 g/dL (ref 11.6–15.9)
LYMPH%: 31.6 % (ref 14.0–49.7)
MCH: 30.3 pg (ref 25.1–34.0)
MCHC: 35.1 g/dL (ref 31.5–36.0)
MCV: 86.2 fL (ref 79.5–101.0)
MONO#: 0.3 10*3/uL (ref 0.1–0.9)
MONO%: 7.3 % (ref 0.0–14.0)
NEUT%: 58.5 % (ref 38.4–76.8)
NEUTROS ABS: 2.5 10*3/uL (ref 1.5–6.5)
NRBC: 0 % (ref 0–0)
Platelets: 172 10*3/uL (ref 145–400)
RBC: 4.79 10*6/uL (ref 3.70–5.45)
RDW: 12.3 % (ref 11.2–14.5)
WBC: 4.2 10*3/uL (ref 3.9–10.3)
lymph#: 1.3 10*3/uL (ref 0.9–3.3)

## 2013-08-30 IMAGING — CR DG CHEST 1V PORT
1 series · 1 of 1 positions shown · non-contrast
Comparison: 07/13/2012

CLINICAL DATA: History of breast cancer, post port catheter
placement in the operating room

PORTABLE CHEST - 1 VIEW

[AP]
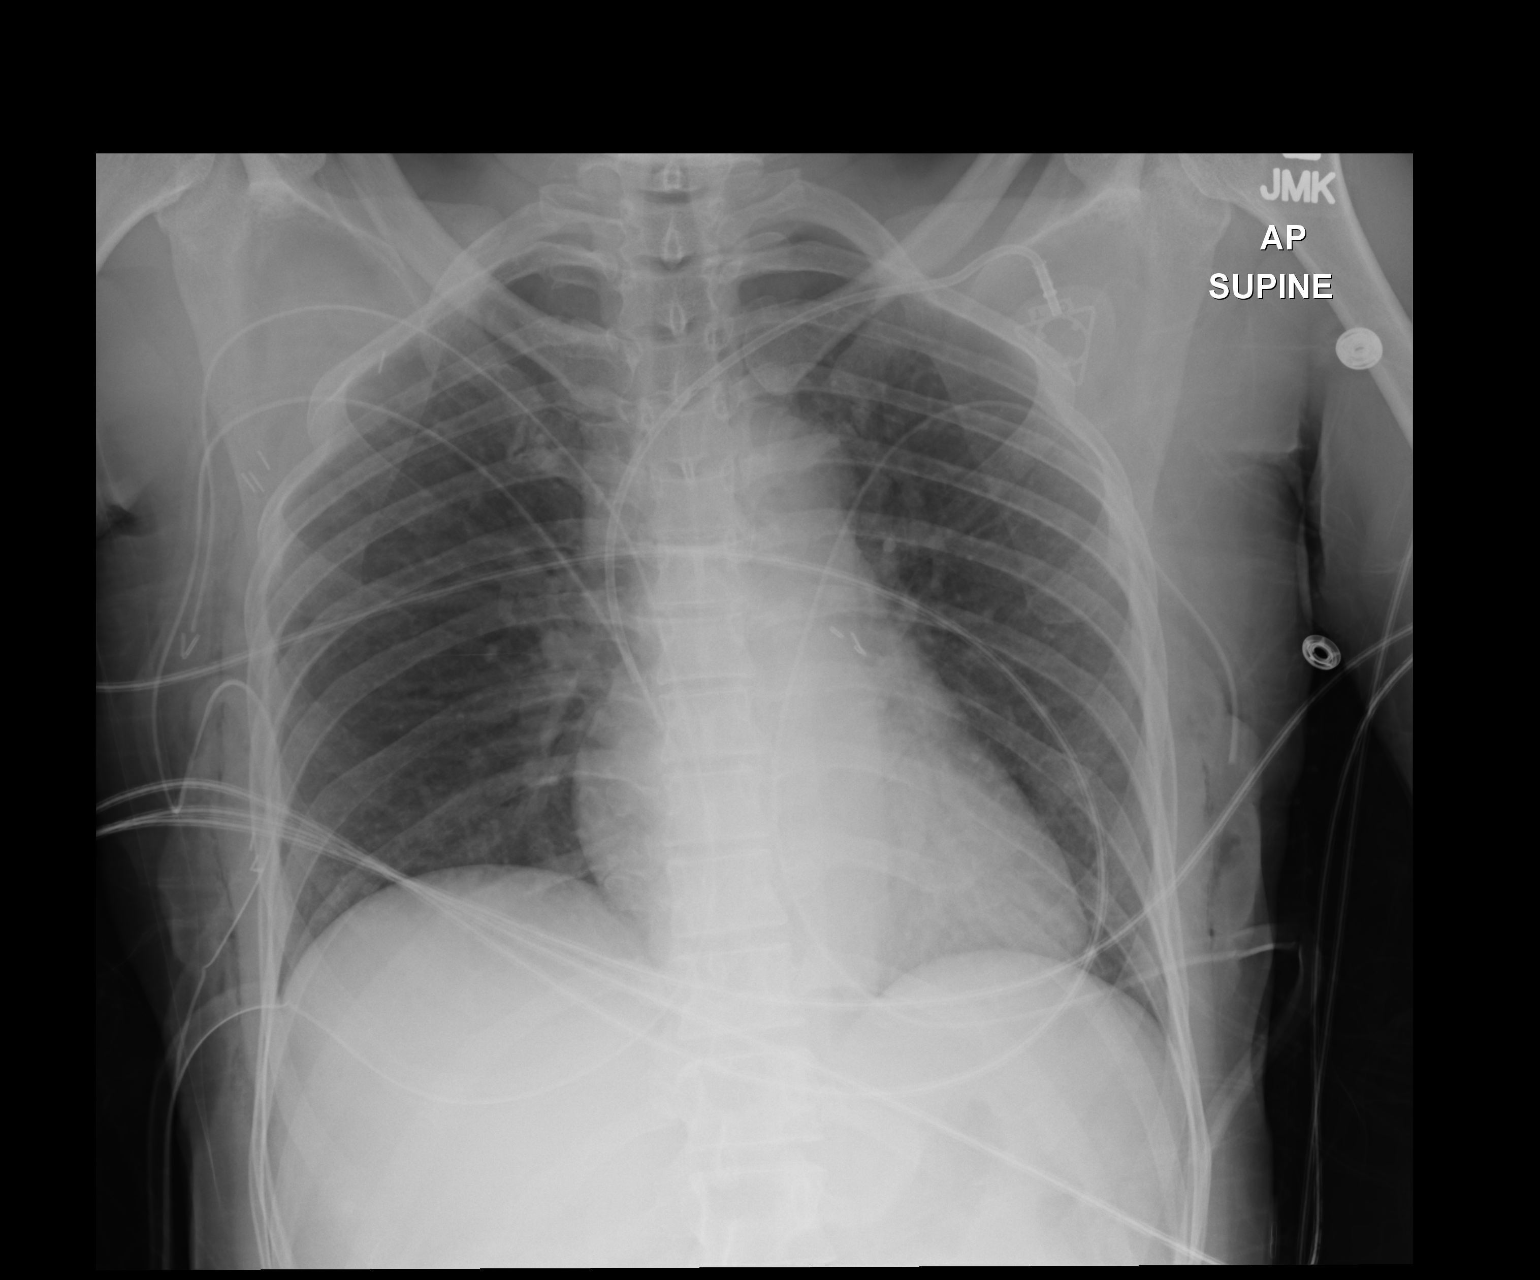

[1 of 1 positions shown; findings below may reference images not displayed]

FINDINGS: Grossly unchanged cardiac silhouette and mediastinal contours.
Surgical clips overlie the left hilum and may be external to the
patient.  Interval placement of a left anterior chest wall
subclavian vein approach Port-A-Cath with tip overlying the
superior cavoatrial junction.  No pneumothorax.  No focal airspace
opacity or pleural effusion.  There is a minimal amount of
subcutaneous emphysema within the bilateral lateral chest walls.
Several surgical drains overlie the right axilla.  Right axillary
surgical clips. Unchanged bones.
IMPRESSION: 1.  Post left subclavian vein approach port catheter with tip
overlying the superior cavoatrial junction.  No pneumothorax.
2.  Postsurgical change of the right axilla.

3.  No acute cardiopulmonary disease.

## 2013-08-30 MED ORDER — DIPHENHYDRAMINE HCL 25 MG PO CAPS
50.0000 mg | ORAL_CAPSULE | Freq: Once | ORAL | Status: AC
  Administered 2013-08-30: 50 mg via ORAL

## 2013-08-30 MED ORDER — SODIUM CHLORIDE 0.9 % IJ SOLN
10.0000 mL | INTRAMUSCULAR | Status: DC | PRN
  Administered 2013-08-30: 10 mL
  Filled 2013-08-30: qty 10

## 2013-08-30 MED ORDER — CLONIDINE HCL 0.1 MG PO TABS
0.1000 mg | ORAL_TABLET | Freq: Once | ORAL | Status: AC
  Administered 2013-08-30: 0.1 mg via ORAL

## 2013-08-30 MED ORDER — ACETAMINOPHEN 325 MG PO TABS
ORAL_TABLET | ORAL | Status: AC
  Filled 2013-08-30: qty 2

## 2013-08-30 MED ORDER — TRASTUZUMAB CHEMO INJECTION 440 MG
378.0000 mg | Freq: Once | INTRAVENOUS | Status: AC
  Administered 2013-08-30: 378 mg via INTRAVENOUS
  Filled 2013-08-30: qty 18

## 2013-08-30 MED ORDER — CLONIDINE HCL 0.1 MG PO TABS
ORAL_TABLET | ORAL | Status: AC
  Filled 2013-08-30: qty 1

## 2013-08-30 MED ORDER — DIPHENHYDRAMINE HCL 25 MG PO CAPS
ORAL_CAPSULE | ORAL | Status: AC
  Filled 2013-08-30: qty 2

## 2013-08-30 MED ORDER — SODIUM CHLORIDE 0.9 % IV SOLN
Freq: Once | INTRAVENOUS | Status: AC
  Administered 2013-08-30: 09:00:00 via INTRAVENOUS

## 2013-08-30 MED ORDER — ACETAMINOPHEN 325 MG PO TABS
650.0000 mg | ORAL_TABLET | Freq: Once | ORAL | Status: AC
  Administered 2013-08-30: 650 mg via ORAL

## 2013-08-30 MED ORDER — HEPARIN SOD (PORK) LOCK FLUSH 100 UNIT/ML IV SOLN
500.0000 [IU] | Freq: Once | INTRAVENOUS | Status: AC | PRN
  Administered 2013-08-30: 500 [IU]
  Filled 2013-08-30: qty 5

## 2013-08-30 NOTE — Patient Instructions (Signed)
[facility] Discharge Instructions for Patients Receiving Chemotherapy  Today you received the following chemotherapy agents Herceptin  To help prevent nausea and vomiting after your treatment, we encourage you to take your nausea medication     If you develop nausea and vomiting that is not controlled by your nausea medication, call the clinic.   BELOW ARE SYMPTOMS THAT SHOULD BE REPORTED IMMEDIATELY:  *FEVER GREATER THAN 100.5 F  *CHILLS WITH OR WITHOUT FEVER  NAUSEA AND VOMITING THAT IS NOT CONTROLLED WITH YOUR NAUSEA MEDICATION  *UNUSUAL SHORTNESS OF BREATH  *UNUSUAL BRUISING OR BLEEDING  TENDERNESS IN MOUTH AND THROAT WITH OR WITHOUT PRESENCE OF ULCERS  *URINARY PROBLEMS  *BOWEL PROBLEMS  UNUSUAL RASH Items with * indicate a potential emergency and should be followed up as soon as possible.  Feel free to call the clinic you have any questions or concerns. The clinic phone number is [phone].    

## 2013-09-11 ENCOUNTER — Other Ambulatory Visit: Payer: Self-pay | Admitting: Plastic Surgery

## 2013-09-11 DIAGNOSIS — Z9013 Acquired absence of bilateral breasts and nipples: Secondary | ICD-10-CM

## 2013-09-11 DIAGNOSIS — Z9010 Acquired absence of unspecified breast and nipple: Secondary | ICD-10-CM

## 2013-09-11 NOTE — H&P (Signed)
Sherry Barry is an 43 y.o. female.   Chief Complaint: bilateral acquired absence of breasts HPI: The patient is a 43 yrs old female here for history and physical for breast reconstruction after bilateral mastectomies for treatment of right breast cancer. 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 A repeat mammogram in 2013 showed a decrease in size of the benign fibroadenoma in the left breast compared to 2010. However, there were pleomorphic calcifications and thickening in the right breast. An ultrasound of the right breast (1/14) showed multiple hypoechoic masses. 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 Bilateral breast MRI (1/14) showed enhancing masses. The biopsy of the left breast (01/14) showed no evidence of malignancy. Genetics were negative.  She patient underwent bilateral mastectomies (by Dr. Brantley Stage) on 06/2012. The left mastectomy was negative. The right mastectomy revealed 2 tumors: #1) invasive ductal carcinoma, grade 3, and DCIS, grade 3, with comedo necrosis. Lymphovascular invasion was identified; #2) a ductal carcinoma in situ, grade 3, with comedo necrosis. Surgical margins were negative. One of the four lymph nodes were positive. She received Trastuzumab and Tamoxifen. She has a midline abdominal incision. Her abdomen is small and the skin if floppy. She finished radiation on the right September 2014. She is 5 feet 2 inches tall, weighs 137 pounds and wore a 34DD bra.    Past Medical History  Diagnosis Date  . Allergy   . Migraine   . Migraine   . History of migraine     last one about a week ago  . History of chemotherapy     doxetaxel/carboplatin/trastuzumab  . Breast cancer   . Breast cancer     right  . Hypertension   . Hx of radiation therapy 01/01/13-  02/15/13    r chest wall, r supraclav/axilla 5040 cGy/28 sessions, scar boost 1000 cGy/5 sessions    Past Surgical History  Procedure Laterality Date  . Simple mastectomy with axillary sentinel node biopsy  07/14/2012    Procedure: SIMPLE MASTECTOMY WITH AXILLARY SENTINEL NODE BIOPSY;  Surgeon: Joyice Faster. Cornett, MD;  Location: [place];  Service: General;  Laterality: Right;  Bilateral simple mastectomy with port and right sebtibel lymph node mapping  . Simple mastectomy with axillary sentinel node biopsy  07/14/2012    Procedure: SIMPLE MASTECTOMY;  Surgeon: Joyice Faster. Cornett, MD;  Location: [place];  Service: General;  Laterality: Left;  . Portacath placement  07/14/2012    Procedure: INSERTION PORT-A-CATH;  Surgeon: Joyice Faster. Cornett, MD;  Location: [place];  Service: General;  Laterality: Left;  . Abdominal hysterectomy      no salpingo-oophorectomy    Family History  Problem Relation Age of Onset  . Hypertension Mother   . Alcohol abuse Mother   . Heart disease Maternal Grandmother   . Stroke Maternal Grandfather   . Colon cancer Maternal Aunt 35    alive at 73  . Brain cancer Maternal Uncle 84    and lymphoma in early 60s; deceased  . Brain cancer Maternal Uncle 60    deceased  . Pancreatic cancer Maternal Uncle 45    alive at 19  . Breast cancer Maternal Aunt     great aunt through mat GF; dx at ? age  . Breast cancer Cousin 65    mat  1st cousin once removed through mat GF ; deceased   Social History:  reports that she has never smoked. She has never used smokeless tobacco. She reports that she drinks alcohol. She reports that she does not use illicit drugs.  Allergies:  Allergies  Allergen Reactions  . Codeine Itching  . Latex Swelling     (Not in a hospital admission)  No results found for this or any previous visit (from the past 48 hour(s)). No results found.  Review of Systems  Constitutional: Negative.   HENT: Negative.   Eyes: Negative.   Respiratory:  Negative.   Cardiovascular: Negative.   Gastrointestinal: Negative.   Genitourinary: Negative.   Musculoskeletal: Negative.   Skin: Negative.   Neurological: Negative.   Endo/Heme/Allergies: Negative.   Psychiatric/Behavioral: Negative.     There were no vitals taken for this visit. Physical Exam  Constitutional: She appears well-developed and well-nourished.  HENT:  Head: Normocephalic and atraumatic.  Eyes: Conjunctivae and EOM are normal. Pupils are equal, round, and reactive to light.  Cardiovascular: Normal rate.   Respiratory: Effort normal.  GI: Soft.  Musculoskeletal: Normal range of motion.  Neurological: She is alert.  Skin: Skin is warm.  Psychiatric: She has a normal mood and affect. Her behavior is normal. Judgment and thought content normal.     Assessment/Plan Right latissimus muscle flap and placement of tissue expander; placement of tissue expander & Flex HD to left breast.  SANGER,Aulden Calise 09/11/2013, 10:03 AM

## 2013-09-12 ENCOUNTER — Encounter (HOSPITAL_COMMUNITY)
Admission: RE | Admit: 2013-09-12 | Discharge: 2013-09-12 | Disposition: A | Discharge status: Home / Self Care | Payer: BC Managed Care – PPO | Source: Ambulatory Visit | Attending: Plastic Surgery | Admitting: Plastic Surgery

## 2013-09-12 ENCOUNTER — Encounter: Payer: Self-pay | Admitting: Physician Assistant

## 2013-09-12 ENCOUNTER — Other Ambulatory Visit (HOSPITAL_BASED_OUTPATIENT_CLINIC_OR_DEPARTMENT_OTHER): Payer: BC Managed Care – PPO

## 2013-09-12 ENCOUNTER — Ambulatory Visit (HOSPITAL_BASED_OUTPATIENT_CLINIC_OR_DEPARTMENT_OTHER): Payer: BC Managed Care – PPO | Admitting: Physician Assistant

## 2013-09-12 ENCOUNTER — Encounter (HOSPITAL_COMMUNITY): Payer: Self-pay

## 2013-09-12 VITALS — BP 132/86 | HR 88 | Temp 98.4°F | Resp 18 | Ht 63.0 in | Wt 134.8 lb

## 2013-09-12 VITALS — BP 146/97 | HR 91 | Temp 98.2°F | Resp 18 | Ht 62.5 in | Wt 134.0 lb

## 2013-09-12 DIAGNOSIS — F419 Anxiety disorder, unspecified: Secondary | ICD-10-CM

## 2013-09-12 DIAGNOSIS — Z01812 Encounter for preprocedural laboratory examination: Secondary | ICD-10-CM | POA: Insufficient documentation

## 2013-09-12 DIAGNOSIS — I10 Essential (primary) hypertension: Secondary | ICD-10-CM

## 2013-09-12 DIAGNOSIS — C50219 Malignant neoplasm of upper-inner quadrant of unspecified female breast: Secondary | ICD-10-CM

## 2013-09-12 DIAGNOSIS — F411 Generalized anxiety disorder: Secondary | ICD-10-CM

## 2013-09-12 DIAGNOSIS — Z170 Estrogen receptor positive status [ER+]: Secondary | ICD-10-CM

## 2013-09-12 DIAGNOSIS — Z01818 Encounter for other preprocedural examination: Secondary | ICD-10-CM | POA: Insufficient documentation

## 2013-09-12 DIAGNOSIS — Z01810 Encounter for preprocedural cardiovascular examination: Secondary | ICD-10-CM | POA: Insufficient documentation

## 2013-09-12 DIAGNOSIS — Z9013 Acquired absence of bilateral breasts and nipples: Secondary | ICD-10-CM

## 2013-09-12 DIAGNOSIS — Z853 Personal history of malignant neoplasm of breast: Secondary | ICD-10-CM

## 2013-09-12 LAB — COMPREHENSIVE METABOLIC PANEL (CC13)
ALT: 14 U/L (ref 0–55)
ANION GAP: 13 meq/L — AB (ref 3–11)
AST: 18 U/L (ref 5–34)
Albumin: 4 g/dL (ref 3.5–5.0)
Alkaline Phosphatase: 67 U/L (ref 40–150)
BILIRUBIN TOTAL: 0.5 mg/dL (ref 0.20–1.20)
BUN: 15.1 mg/dL (ref 7.0–26.0)
CALCIUM: 9.6 mg/dL (ref 8.4–10.4)
CHLORIDE: 108 meq/L (ref 98–109)
CO2: 25 meq/L (ref 22–29)
Creatinine: 0.8 mg/dL (ref 0.6–1.1)
Glucose: 83 mg/dl (ref 70–140)
Potassium: 3.5 mEq/L (ref 3.5–5.1)
Sodium: 145 mEq/L (ref 136–145)
Total Protein: 7.5 g/dL (ref 6.4–8.3)

## 2013-09-12 LAB — CBC WITH DIFFERENTIAL/PLATELET
BASO%: 0.8 % (ref 0.0–2.0)
Basophils Absolute: 0 10*3/uL (ref 0.0–0.1)
EOS%: 2.4 % (ref 0.0–7.0)
Eosinophils Absolute: 0.1 10*3/uL (ref 0.0–0.5)
HEMATOCRIT: 43.1 % (ref 34.8–46.6)
HGB: 14.7 g/dL (ref 11.6–15.9)
LYMPH#: 1.3 10*3/uL (ref 0.9–3.3)
LYMPH%: 28.3 % (ref 14.0–49.7)
MCH: 30.1 pg (ref 25.1–34.0)
MCHC: 34 g/dL (ref 31.5–36.0)
MCV: 88.4 fL (ref 79.5–101.0)
MONO#: 0.3 10*3/uL (ref 0.1–0.9)
MONO%: 7 % (ref 0.0–14.0)
NEUT#: 2.9 10*3/uL (ref 1.5–6.5)
NEUT%: 61.5 % (ref 38.4–76.8)
PLATELETS: 185 10*3/uL (ref 145–400)
RBC: 4.87 10*6/uL (ref 3.70–5.45)
RDW: 12.6 % (ref 11.2–14.5)
WBC: 4.7 10*3/uL (ref 3.9–10.3)

## 2013-09-12 LAB — CBC
HEMATOCRIT: 42 % (ref 36.0–46.0)
Hemoglobin: 14.9 g/dL (ref 12.0–15.0)
MCH: 30.3 pg (ref 26.0–34.0)
MCHC: 35.5 g/dL (ref 30.0–36.0)
MCV: 85.4 fL (ref 78.0–100.0)
Platelets: 188 10*3/uL (ref 150–400)
RBC: 4.92 MIL/uL (ref 3.87–5.11)
RDW: 12.2 % (ref 11.5–15.5)
WBC: 4.8 10*3/uL (ref 4.0–10.5)

## 2013-09-12 LAB — BASIC METABOLIC PANEL
BUN: 16 mg/dL (ref 6–23)
CO2: 24 mEq/L (ref 19–32)
Calcium: 9.6 mg/dL (ref 8.4–10.5)
Chloride: 103 mEq/L (ref 96–112)
Creatinine, Ser: 0.7 mg/dL (ref 0.50–1.10)
GFR calc Af Amer: >90 mL/min (ref >90)
Glucose, Bld: 81 mg/dL (ref 70–99)
Potassium: 4 mEq/L (ref 3.7–5.3)
Sodium: 141 mEq/L (ref 137–147)

## 2013-09-12 NOTE — Progress Notes (Signed)
ID: Sherry Barry   DOB: [birth year]  MR#: [id number]  TUU#:[id number]  PCP: Lynne Logan, MD GYN: Everett Graff. MD SU: Erroll Luna. 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 At that time a digital bilateal diagnostic mammogram showed a slight decrease in size of the benign fibroadenoma in the left breast compared to 2010. The same mammogram, however, also refilled pleomorphic calcifications extending over 9 cm in the inner third of the right breast. There were no suspicious calcifications in the left breast. On exam, Dr. Purcell Nails was able to palpate a small freely mobile mass in the lower inner left breast, but was unable to palpate any abnormalities in the right breast other than some focal thickening.  An subsequent ultrasound of the right breast on 06/16/2012 showed multiple hypoechoic masses throughout the upper quadrants of the right breast. The palpable mass in the 1:00 location corresponded with an irregular hypoechoic mass measuring 1.7 x 1.4 cm. There was also a discrete hypoechoic lobulated mass at the 11:00 position measuring 2.1 x 1.1 x 2.1 cm. The third lesion identified at the 11:00 location, 3 cm from the nipple, measured 1.0 x 0.8 x 0.9 cm.   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  Bilateral breast MRI 06/23/2012 showed an enhancing mass in the upper central right breast measuring 1.8 cm, together with clumped nodular enhancement extending for a total area of  11.4 cm. In the upper outer quadrant of this breast there were 3 discrete irregular masses measuring up to 3 cm. This correlated well with the ultrasound findings previously. In the left breast there was a large area of clumped nodular enhancement measuring up to 8.9 cm. In the lower central portion there was a 1.7 cm circumscribed nodule consistent with a known fibroadenoma. There weas no axillary or internal mammary adenopathy noted on either side.   The 8.9 cm area of abnormality in the left breast was biopsied on 06/30/2012 showing fibrocystic changes with adenosis and calcifications, pseudoangiomatous stromal hyperplasia, but no evidence of malignancy.  The patient underwent bilateral mastectomies under the care of Dr. Brantley Stage on 07/14/2012. The left mastectomy revealed benign breast tissue and was negative for malignancy. 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 Lymphovascular invasion was identified; #2) a ductal carcinoma in situ, grade 3, with comedo necrosis.  Surgical margins were negative. 4 lymph nodes were examined, one positive for metastatic mammary carcinoma, a second positive for isolated tumor cells. pT2, pN1a  Subsequent history is detailed below.  INTERVAL HISTORY: Jamira returns alone today for followup of her right breast cancer. She completed her final q. three-week dose of trastuzumab on 08/30/2013, completing a total of one year of therapy. We do need to obtain an echocardiogram which has been requested, but has not yet been scheduled. She also continues on tamoxifen which she started in September 2014. She's tolerating the tamoxifen  well with no significant side effects. Specifically, she's had no vaginal changes, dryness, discharge, or bleeding. She has occasional hot flashes, but nothing she would consider problematic. She's had no peripheral swelling. She denies any  abnormal blood clots. In fact she really had no new complaints at all today.  Katryna is scheduled for her reconstructive surgery under the care of Dr. Migdalia Dk in 2 weeks, and is looking forward to having that behind her.    REVIEW OF SYSTEMS: Hadassah's energy level has recovered well, and she's had no recent illnesses, fevers, or chills. She's had no rashes or skin changes and denies any abnormal bruising. Her appetite is good she denies any nausea or change in bowel or bladder habits. She's had no cough, increased shortness of breath, orthopnea, peripheral swelling, chest pain, or palpitations. She has been having some allergy issues with a runny nose, and is trying to determine exactly what it is she is allergic to. She denies any abnormal headaches, dizziness, or change in vision. She currently denies any unusual myalgias, arthralgias, or bony pain, and has had no signs of peripheral neuropathy.  A detailed review of systems is otherwise stable and noncontributory.   PAST MEDICAL HISTORY: Past Medical History  Diagnosis Date  . Allergy   . Migraine   . Migraine   . History of migraine     last one about a week ago  . History of chemotherapy     doxetaxel/carboplatin/trastuzumab  . Breast cancer   . Breast cancer     right  . Hypertension   . Hx of radiation therapy 01/01/13- 02/15/13    r chest wall, r supraclav/axilla 5040 cGy/28 sessions, scar boost 1000 cGy/5 sessions    PAST SURGICAL HISTORY: Past Surgical History  Procedure Laterality Date  . Simple mastectomy with axillary sentinel node biopsy  07/14/2012    Procedure: SIMPLE MASTECTOMY WITH AXILLARY SENTINEL NODE BIOPSY;  Surgeon: Joyice Faster. Cornett, MD;  Location: [place];  Service: General;  Laterality: Right;  Bilateral simple mastectomy with port and right sebtibel lymph node mapping  . Simple mastectomy with axillary sentinel node biopsy  07/14/2012    Procedure: SIMPLE MASTECTOMY;  Surgeon: Joyice Faster. Cornett, MD;  Location: [place];  Service: General;  Laterality: Left;  . Portacath placement  07/14/2012    Procedure: INSERTION PORT-A-CATH;  Surgeon: Joyice Faster. Cornett, MD;  Location: [place];  Service: General;  Laterality: Left;  . Abdominal hysterectomy      no salpingo-oophorectomy    FAMILY HISTORY Family History  Problem Relation Age of Onset  . Hypertension Mother   . Alcohol abuse Mother   . Heart disease Maternal Grandmother   . Stroke Maternal Grandfather   . Colon cancer Maternal Aunt 63    alive at 85  . Brain cancer Maternal Uncle 63    and lymphoma in early 1s; deceased  . Brain cancer Maternal Uncle 60    deceased  . Pancreatic cancer Maternal Uncle 45    alive at 71  . Breast cancer Maternal Aunt     great aunt through mat GF; dx at ? age  . Breast cancer Cousin 49    mat 1st cousin once removed through mat GF ; deceased   the patient's father died in an automobile accident in his early 77s. The patient's mother is living, currently age 64. The patient has one brother and one sister. There is no family history of breast or ovarian cancer, although there are maternal  uncles with brain, colon and lymph node cancers.  GYNECOLOGIC HISTORY:  (Updated 09/12/2013) Menarche age 35, first live birth age 53, she is Kulm P2. She stopped having periods with her hysterectomy in March of 2009. She does have "premenstrual symptoms" on a regular basis.  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 Her husband Harless Nakayama works for a company in the [place] area doing testing. Their children Jaden (10) and Bryson (5) at home.   ADVANCED DIRECTIVES: not in place  HEALTH MAINTENANCE:  (Updated 09/12/2013) History  Substance Use Topics  . Smoking status: Never Smoker   . Smokeless tobacco: Never Used  . Alcohol Use: Yes     Comment: occasional     Colonoscopy: Never  PAP: 09/2010, Dr. Mancel Bale  Bone density: Never  Lipid panel:   Not on file, Dr. Nancy Fetter   Allergies  Allergen Reactions  . Codeine Itching  . Latex Swelling    Current Outpatient Prescriptions  Medication Sig Dispense Refill  . diclofenac (VOLTAREN) 75 MG EC tablet Take 1 tablet (75 mg total) by mouth 2 (two) times daily.  30 tablet  0  . LORazepam (ATIVAN) 0.5 MG tablet Take 0.5-1 tablets (0.25-0.5 mg total) by mouth 2 (two) times daily as needed for anxiety (Nausea or vomiting).  30 tablet  0  . pimecrolimus (ELIDEL) 1 % cream Apply topically 2 (two) times daily.  60 g  1  . tamoxifen (NOLVADEX) 20 MG tablet Take 20 mg by mouth daily.      [name] triamcinolone (NASACORT) 55 MCG/ACT AERO nasal inhaler Place 2 sprays into the nose.      . diazepam (VALIUM) 2 MG tablet Take 2 mg by mouth.      . docusate sodium (COLACE) 100 MG capsule Take 100 mg by mouth.      [name] HYDROcodone-acetaminophen (NORCO) 5-325 MG per tablet Take 1 tablet by mouth.      . ondansetron (ZOFRAN) 4 MG tablet Take 4 mg by mouth.       No current facility-administered medications for this visit.    OBJECTIVE: Young-appearing African American woman  appears well and is in no acute distress Filed Vitals:   09/12/13 0916  BP: 132/86  Pulse: 88  Temp: 98.4 F (36.9 C)  Resp: 18     Body mass index is 23.88 kg/(m^2).    ECOG FS: 0 Filed Weights   09/12/13 0914 09/12/13 0916  Weight: 134 lb 12.8 oz (61.145 kg) 134 lb 12.8 oz (61.145 kg)   Physical Exam: HEENT:  Sclerae anicteric.  Oropharynx clear, pink, and moist, with no mucositis or thrush. Neck is supple. Trachea midline with no thyromegaly palpated.   NODES:  No cervical or supraclavicular lymphadenopathy palpated.  BREAST EXAM:  Patient is status post bilateral mastectomies. No evidence of local recurrence on the right. No suspicious nodularities or skin changes. Axillae are benign bilaterally, with no palpable lymphadenopathy. LUNGS:  Clear to auscultation bilaterally with good excursion.  No crackles, wheezes or  rhonchi. HEART:  Regular rate and rhythm. No murmur appreciated ABDOMEN:  Soft, nontender to palpation . No organomegaly or masses. Positive bowel sounds.  MSK:  No focal spinal tenderness to palpation. Full range of motion in the upper extremities.  EXTREMITIES:  No peripheral edema.  No lymphedema in the upper extremities. SKIN:  Benign with no new rashes or skin lesions. No excessive ecchymoses. No petechiae.  NEURO:  Nonfocal. Well oriented.  Appropriate affect.    LAB RESULTS: Lab Results  Component Value Date   WBC 4.7 09/12/2013   NEUTROABS 2.9 09/12/2013   HGB 14.7 09/12/2013   HCT 43.1 09/12/2013   MCV 88.4 09/12/2013   PLT 185 09/12/2013      Chemistry      Component Value Date/Time   NA 145 09/12/2013 0804   NA 135 07/15/2012 0500   K 3.5 09/12/2013 0804   K 3.9 07/15/2012 0500   CL 101 11/15/2012 0904   CL 103 07/15/2012 0500   CO2 25 09/12/2013 0804   CO2 25 07/15/2012 0500   BUN 15.1 09/12/2013 0804   BUN 7 07/15/2012 0500   CREATININE 0.8 09/12/2013 0804   CREATININE 0.62 07/15/2012 0500   CREATININE 0.80 05/02/2012 1412      Component Value Date/Time   CALCIUM 9.6 09/12/2013 0804   CALCIUM 8.2* 07/15/2012 0500   ALKPHOS 67 09/12/2013 0804   ALKPHOS 46 07/14/2012 2300   AST 18 09/12/2013 0804   AST 14 07/14/2012 2300   ALT 14 09/12/2013 0804   ALT 7 07/14/2012 2300   BILITOT 0.50 09/12/2013 0804   BILITOT 0.3 07/14/2012 2300       STUDIES:  Echocardiogram 04/30/2013 showed an ejection fraction of 60 - 65 %.   This is currently due to be repeated and has been ordered for early April.     ASSESSMENT: 43 y.o. [place] woman   (1)  status post right mastectomy and sentinel lymph node sampling 07/14/2012 (with simple left mastectomy)  for a pT2, pN1, stage IIB invasive ductal carcinoma, grade 3, estrogen receptor 100% and progesterone receptor 100% positive, with an MIB-1 of 48%, and HER-2 amplified by CISH with a ratio of 3.02.  (2) adjuvant chemotherapy started 08/17/2012, consisting of  carboplatin, docetaxel and trastuzumab given every 3 weeks for 6 cycles, completed 11/30/2012  (3) trastuzumab continued for total of one year, final dose given on 08/30/2013 with good tolerance.  (4)  Status post radiation therapy, completed in early September  (5) started on tamoxifen in mid September 2014  (6) Eczema, treated with Elidel twice daily  (7)  Anxiety, lorazepam 0.5 mg twice a day as needed    PLAN:  Tareva continues to do very well, and there is no clinical evidence of disease recurrence. She's tolerating the tamoxifen well and I make no changes to her current regimen. As noted above, she is scheduled for her reconstruction in 2 weeks, and we really need to have her final echocardiogram prior to that surgery. I have requested that the echo as well as a followup with Dr. Haroldine Laws be scheduled within the next 2 weeks.  Otherwise, Renalda will return to see [place] in 3 months. If she is still doing well, we will likely extend her followup to a 6 month basis.  Of note, I will also mention that her blood pressure remained slightly elevated, and she will followup with Dr. Nancy Fetter, her primary care physician, with regards to her hypertension.   All this was reviewed with her in detail today, and she voices understanding and agreement with our plan. She will call with any changes or problems.    Nicholette Dolson PA-C    09/12/2013

## 2013-09-12 NOTE — Pre-Procedure Instructions (Signed)
Sherry Barry  09/12/2013   Your procedure is scheduled on: Monday, September 24, 2013 at 12:45 PM  Report to [facility] (use Main Entrance "A'') at 10:45 AM.  Call this number if you have problems the morning of surgery: [phone]   Remember:   Do not eat food or drink liquids after midnight.   Take these medicines the morning of surgery with A SIP OF WATER: tamoxifen (NOLVADEX), mometasone (NASONEX), if needed:LORazepam (ATIVAN) for anxiety (Nausea or vomiting). Stop taking Aspirin and herbal medications. Do not take any NSAIDs ie: Ibuprofen, Advil, Naproxen or any medication containing Aspirin diclofenac (VOLTAREN)   Do not wear jewelry, make-up or nail polish.  Do not wear lotions, powders, or perfumes. You may NOT wear deodorant.  Do not shave 48 hours prior to surgery  Do not bring valuables to the hospital.  [facility] is not responsible for any belongings or valuables.               Contacts, dentures or bridgework may not be worn into surgery.  Leave suitcase in the car. After surgery it may be brought to your room.  For patients admitted to the hospital, discharge time is determined by your treatment team.               Patients discharged the day of surgery will not be allowed to drive home.  Name and phone number of your driver:   Special Instructions:  Special Instructions:Special Instructions: [facility] - Preparing for Surgery  Before surgery, you can play an important role.  Because skin is not sterile, your skin needs to be as free of germs as possible.  You can reduce the number of germs on you skin by washing with CHG (chlorahexidine gluconate) soap before surgery.  CHG is an antiseptic cleaner which kills germs and bonds with the skin to continue killing germs even after washing.  Please DO NOT use if you have an allergy to CHG or antibacterial soaps.  If your skin becomes reddened/irritated stop using the CHG and inform your nurse when you arrive  at Short Stay.  Do not shave (including legs and underarms) for at least 48 hours prior to the first CHG shower.  You may shave your face.  Please follow these instructions carefully:   1.  Shower with CHG Soap the night before surgery and the morning of Surgery.  2.  If you choose to wash your hair, wash your hair first as usual with your normal shampoo.  3.  After you shampoo, rinse your hair and body thoroughly to remove the Shampoo.  4.  Use CHG as you would any other liquid soap.  You can apply chg directly  to the skin and wash gently with scrungie or a clean washcloth.  5.  Apply the CHG Soap to your body ONLY FROM THE NECK DOWN.  Do not use on open wounds or open sores.  Avoid contact with your eyes, ears, mouth and genitals (private parts).  Wash genitals (private parts) with your normal soap.  6.  Wash thoroughly, paying special attention to the area where your surgery will be performed.  7.  Thoroughly rinse your body with warm water from the neck down.  8.  DO NOT shower/wash with your normal soap after using and rinsing off the CHG Soap.  9.  Pat yourself dry with a clean towel.            10.  Wear clean  pajamas.            11.  Place clean sheets on your bed the night of your first shower and do not sleep with pets.  Day of Surgery  Do not apply any lotions/deodorants the morning of surgery.  Please wear clean clothes to the hospital/surgery center.   Please read over the following fact sheets that you were given: Pain Booklet, Coughing and Deep Breathing and Surgical Site Infection Prevention

## 2013-09-12 NOTE — Pre-Procedure Instructions (Signed)
Sherry Barry  09/12/2013   Your procedure is scheduled on: Monday, September 24, 2013 at 12:45 PM  Report to [facility] (use Main Entrance "A'') at 10:45 AM.  Call this number if you have problems the morning of surgery: [phone]   Remember:   Do not eat food or drink liquids after midnight.   Take these medicines the morning of surgery with A SIP OF WATER: tamoxifen (NOLVADEX), mometasone (NASONEX), if needed:LORazepam (ATIVAN) for anxiety (Nausea or vomiting). Stop taking Aspirin and herbal medications. Do not take any NSAIDs ie: Ibuprofen, Advil, Naproxen or any medication containing Aspirin diclofenac (VOLTAREN)   Do not wear jewelry, make-up or nail polish.  Do not wear lotions, powders, or perfumes. You may NOT wear deodorant.  Do not shave 48 hours prior to surgery  Do not bring valuables to the hospital.  [facility] is not responsible for any belongings or valuables.               Contacts, dentures or bridgework may not be worn into surgery.  Leave suitcase in the car. After surgery it may be brought to your room.  For patients admitted to the hospital, discharge time is determined by your treatment team.               Patients discharged the day of surgery will not be allowed to drive home.  Name and phone number of your driver:   Special Instructions:  Special Instructions:Special Instructions: [facility] - Preparing for Surgery  Before surgery, you can play an important role.  Because skin is not sterile, your skin needs to be as free of germs as possible.  You can reduce the number of germs on you skin by washing with CHG (chlorahexidine gluconate) soap before surgery.  CHG is an antiseptic cleaner which kills germs and bonds with the skin to continue killing germs even after washing.  Please DO NOT use if you have an allergy to CHG or antibacterial soaps.  If your skin becomes reddened/irritated stop using the CHG and inform your nurse when you arrive  at Short Stay.  Do not shave (including legs and underarms) for at least 48 hours prior to the first CHG shower.  You may shave your face.  Please follow these instructions carefully:   1.  Shower with CHG Soap the night before surgery and the morning of Surgery.  2.  If you choose to wash your hair, wash your hair first as usual with your normal shampoo.  3.  After you shampoo, rinse your hair and body thoroughly to remove the Shampoo.  4.  Use CHG as you would any other liquid soap.  You can apply chg directly  to the skin and wash gently with scrungie or a clean washcloth.  5.  Apply the CHG Soap to your body ONLY FROM THE NECK DOWN.  Do not use on open wounds or open sores.  Avoid contact with your eyes, ears, mouth and genitals (private parts).  Wash genitals (private parts) with your normal soap.  6.  Wash thoroughly, paying special attention to the area where your surgery will be performed.  7.  Thoroughly rinse your body with warm water from the neck down.  8.  DO NOT shower/wash with your normal soap after using and rinsing off the CHG Soap.  9.  Pat yourself dry with a clean towel.            10.  Wear clean   pajamas.            11.  Place clean sheets on your bed the night of your first shower and do not sleep with pets.  Day of Surgery  Do not apply any lotions/deodorants the morning of surgery.  Please wear clean clothes to the hospital/surgery center.   Please read over the following fact sheets that you were given: Pain Booklet, Coughing and Deep Breathing and Surgical Site Infection Prevention

## 2013-09-17 ENCOUNTER — Telehealth: Payer: Self-pay | Admitting: Oncology

## 2013-09-17 NOTE — Telephone Encounter (Signed)
, °

## 2013-09-21 ENCOUNTER — Other Ambulatory Visit: Payer: Self-pay | Admitting: Plastic Surgery

## 2013-09-21 ENCOUNTER — Ambulatory Visit (INDEPENDENT_AMBULATORY_CARE_PROVIDER_SITE_OTHER): Payer: BC Managed Care – PPO | Admitting: Emergency Medicine

## 2013-09-21 VITALS — BP 126/84 | HR 98 | Temp 98.9°F | Resp 16 | Ht 64.5 in | Wt 137.0 lb

## 2013-09-21 DIAGNOSIS — R509 Fever, unspecified: Secondary | ICD-10-CM

## 2013-09-21 DIAGNOSIS — J029 Acute pharyngitis, unspecified: Secondary | ICD-10-CM

## 2013-09-21 DIAGNOSIS — Z9010 Acquired absence of unspecified breast and nipple: Secondary | ICD-10-CM

## 2013-09-21 LAB — POCT CBC
Granulocyte percent: 73.9 %G (ref 37–80)
HCT, POC: 44 % (ref 37.7–47.9)
Hemoglobin: 14.6 g/dL (ref 12.2–16.2)
LYMPH, POC: 1.2 (ref 0.6–3.4)
MCH, POC: 30.4 pg (ref 27–31.2)
MCHC: 33.2 g/dL (ref 31.8–35.4)
MCV: 91.5 fL (ref 80–97)
MID (cbc): 0.5 (ref 0–0.9)
MPV: 8.3 fL (ref 0–99.8)
POC Granulocyte: 4.6 (ref 2–6.9)
POC LYMPH %: 18.6 % (ref 10–50)
POC MID %: 7.5 % (ref 0–12)
Platelet Count, POC: 195 10*3/uL (ref 142–424)
RBC: 4.81 M/uL (ref 4.04–5.48)
RDW, POC: 12.4 %
WBC: 6.2 10*3/uL (ref 4.6–10.2)

## 2013-09-21 LAB — POCT INFLUENZA A/B
Influenza A, POC: NEGATIVE
Influenza B, POC: NEGATIVE

## 2013-09-21 LAB — POCT RAPID STREP A (OFFICE): Rapid Strep A Screen: NEGATIVE

## 2013-09-21 NOTE — Progress Notes (Addendum)
Subjective:    Patient ID: Sherry Barry, female    DOB: Feb 24, 1971, 43 y.o.   MRN: [id number]  HPI This chart was scribed for Remo Lipps Revis Whalin-MD, by Lovena Le Day, Scribe. This patient was seen in room 5 and the patient's care was started at 10:29 AM.  HPI Comments: Sherry Barry is a 43 y.o. female who presents to the [facility] for illness that began yesterday afternoon with fever/chills (max T 101 F), head congestion and a mild, non-productive cough. 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 She denies any sick contacts. She denies sore throat, myalgias, skin rash, emesis, diarrhea or dysuria. She has not taken any medicines for this problem.  She had chemo last year for breast CA, scheduled Monday for breast reconstruction at [facility] with Dr. Starleen Arms.  Past Medical History  Diagnosis Date  . Allergy   . Migraine   . Migraine   . History of migraine     last one about a week ago  . History of chemotherapy     doxetaxel/carboplatin/trastuzumab  . Breast cancer   . Breast cancer     right  . Hx of radiation therapy 01/01/13- 02/15/13    r chest wall, r supraclav/axilla 5040 cGy/28 sessions, scar boost 1000 cGy/5 sessions  . Hypertension     no meds,   urgent care on pomana    Allergies  Allergen Reactions  . Codeine Itching  . Latex Swelling    No orders of the defined types were placed in this encounter.    Review of Systems  Constitutional: Positive for fever and chills.  HENT: Positive for congestion and rhinorrhea.   Respiratory: Negative for shortness of breath.   Cardiovascular: Negative for chest pain.  Gastrointestinal: Negative for vomiting, abdominal pain and diarrhea.  Musculoskeletal: Negative for back pain.      Objective:   Physical Exam Nursing note and vitals reviewed. Constitutional: Patient is oriented to person, place, and time. Patient appears well-developed and  well-nourished. No distress.  HENT: TMs are clear. There is significant nasal congestion. There is slight redness of the posterior pharynx Head: Normocephalic and atraumatic.  Neck: Neck supple. No tracheal deviation present.  Cardiovascular: Normal rate, regular rhythm and normal heart sounds.   No murmur heard. Pulmonary/Chest: Effort normal and breath sounds normal. No respiratory distress. Patient has no wheezes. Patient has no rales.  Musculoskeletal: Normal range of motion.  Neurological: Patient is alert and oriented to person, place, and time.  Skin: Skin is warm and dry.  Psychiatric: Patient has a normal mood and affect. Patient's behavior is normal.   Triage Vitals: BP 126/84  Pulse 98  Temp(Src) 98.9 F (37.2 C) (Oral)  Resp 16  Ht 5' 4.5" (1.638 m)  Wt 137 lb (62.143 kg)  BMI 23.16 kg/m2  SpO2 100% Results for orders placed in visit on 09/21/13  POCT RAPID STREP A (OFFICE)      Result Value Ref Range   Rapid Strep A Screen Negative  Negative  POCT INFLUENZA A/B      Result Value Ref Range   Influenza A, POC Negative     Influenza B, POC Negative    . Results for orders placed in visit on 09/21/13  POCT RAPID STREP A (OFFICE)      Result Value Ref Range   Rapid Strep A Screen Negative  Negative  POCT INFLUENZA A/B  Result Value Ref Range   Influenza A, POC Negative     Influenza B, POC Negative    POCT CBC      Result Value Ref Range   WBC 6.2  4.6 - 10.2 K/uL   Lymph, poc 1.2  0.6 - 3.4   POC LYMPH PERCENT 18.6  10 - 50 %L   MID (cbc) 0.5  0 - 0.9   POC MID % 7.5  0 - 12 %M   POC Granulocyte 4.6  2 - 6.9   Granulocyte percent 73.9  37 - 80 %G   RBC 4.81  4.04 - 5.48 M/uL   Hemoglobin 14.6  12.2 - 16.2 g/dL   HCT, POC 44.0  37.7 - 47.9 %   MCV 91.5  80 - 97 fL   MCH, POC 30.4  27 - 31.2 pg   MCHC 33.2  31.8 - 35.4 g/dL   RDW, POC 12.4     Platelet Count, POC 195  142 - 424 K/uL   MPV 8.3  0 - 99.8 fL      Assessment & Plan:  DIAGNOSTIC  STUDIES: Oxygen Saturation is 100% on room air, normal by my interpretation.    COORDINATION OF CARE: At 1025 AM Discussed treatment plan with patient which includes blood work, strept test, flu test. Patient agrees.  Patient advised this is most likely a viral type illness. She needs to notify Dr. Migdalia Dk her surgeon about her situation I personally performed the services described in this documentation, which was scribed in my presence. The recorded information has been reviewed and is accurate.

## 2013-09-21 NOTE — H&P (Signed)
Sherry Barry is an 43 y.o. female.   Chief Complaint: Acquied absence of breasts HPI: The patient is a 43 yrs old female here for history and physical for breast reconstruction after bilateral mastectomies for treatment of right breast cancer. 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 A repeat mammogram in 2013 showed a decrease in size of the benign fibroadenoma in the left breast compared to 2010. However, there were pleomorphic calcifications and thickening in the right breast. An ultrasound of the right breast (1/14) showed multiple hypoechoic masses. 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 Bilateral breast MRI (1/14) showed enhancing masses. The biopsy of the left breast (01/14) showed no evidence of malignancy. Genetics were negative.  She patient underwent bilateral mastectomies on 06/2012. The left mastectomy was negative. The right mastectomy revealed 2 tumors: #1) invasive ductal carcinoma, grade 3, and DCIS, grade 3, with comedo necrosis. Lymphovascular invasion was identified; #2) a ductal carcinoma in situ, grade 3, with comedo necrosis. Surgical margins were negative. One of the four lymph nodes were positive. She received Trastuzumab and Tamoxifen. She has a midline abdominal incision. Her abdomen is small and the skin if floppy. She finished radiation on the right September 2014. She is 5 feet 2 inches tall, weighs 137 pounds and wore a 34DD bra.    Past Medical History  Diagnosis Date  . Allergy   . Migraine   . Migraine   . History of migraine     last one about a week ago  . History of chemotherapy     doxetaxel/carboplatin/trastuzumab  . Breast cancer   . Breast cancer     right  . Hx of radiation therapy 01/01/13- 02/15/13    r chest wall, r supraclav/axilla 5040  cGy/28 sessions, scar boost 1000 cGy/5 sessions  . Hypertension     no meds,   urgent care on pomana    Past Surgical History  Procedure Laterality Date  . Simple mastectomy with axillary sentinel node biopsy  07/14/2012    Procedure: SIMPLE MASTECTOMY WITH AXILLARY SENTINEL NODE BIOPSY;  Surgeon: Joyice Faster. Cornett, MD;  Location: [facility];  Service: General;  Laterality: Right;  Bilateral simple mastectomy with port and right sebtibel lymph node mapping  . Simple mastectomy with axillary sentinel node biopsy  07/14/2012    Procedure: SIMPLE MASTECTOMY;  Surgeon: Joyice Faster. Cornett, MD;  Location: [facility];  Service: General;  Laterality: Left;  . Portacath placement  07/14/2012    Procedure: INSERTION PORT-A-CATH;  Surgeon: Joyice Faster. Cornett, MD;  Location: [facility];  Service: General;  Laterality: Left;  . Abdominal hysterectomy      no salpingo-oophorectomy    Family History  Problem Relation Age of Onset  . Hypertension Mother   . Alcohol abuse Mother   . Heart disease Maternal Grandmother   . Stroke Maternal Grandfather   . Colon cancer Maternal Aunt 90    alive at 19  . Brain cancer Maternal Uncle 64    and lymphoma in early 62s; deceased  . Brain cancer Maternal Uncle 60    deceased  . Pancreatic cancer Maternal Uncle 45    alive at 7  . Breast cancer Maternal Aunt     great aunt through mat GF; dx at ? age  . Breast  cancer Cousin 23    mat 1st cousin once removed through mat GF ; deceased   Social History:  reports that she has never smoked. She has never used smokeless tobacco. She reports that she drinks alcohol. She reports that she does not use illicit drugs.  Allergies:  Allergies  Allergen Reactions  . Codeine Itching  . Latex Swelling     (Not in a hospital admission)  No results found for this or any previous visit (from the past 48 hour(s)). No results found.  Review of Systems  Constitutional: Negative.   HENT: Negative.   Eyes: Negative.   Respiratory:  Negative.   Cardiovascular: Negative.   Gastrointestinal: Negative.   Genitourinary: Negative.   Musculoskeletal: Negative.   Skin: Negative.   Neurological: Negative.   Psychiatric/Behavioral: Negative.     There were no vitals taken for this visit. Physical Exam  Constitutional: She appears well-developed and well-nourished.  HENT:  Head: Normocephalic and atraumatic.  Eyes: Conjunctivae and EOM are normal. Pupils are equal, round, and reactive to light.  Respiratory: Effort normal.  Neurological: She is alert.  Skin: Skin is warm.  Psychiatric: She has a normal mood and affect. Her behavior is normal. Judgment and thought content normal.     Assessment/Plan Plan for right latissimus myocutaneous flap with expander placement and left expander and FlexHD placement.   [name] 09/21/2013, 10:32 AM

## 2013-09-23 LAB — CULTURE, GROUP A STREP: Organism ID, Bacteria: NORMAL

## 2013-09-23 MED ORDER — CEFAZOLIN SODIUM-DEXTROSE 2-3 GM-% IV SOLR
2.0000 g | INTRAVENOUS | Status: AC
  Administered 2013-09-24: 2 g via INTRAVENOUS
  Filled 2013-09-23: qty 50

## 2013-09-24 ENCOUNTER — Encounter (HOSPITAL_COMMUNITY): Payer: Self-pay | Admitting: Plastic Surgery

## 2013-09-24 ENCOUNTER — Inpatient Hospital Stay (HOSPITAL_COMMUNITY): Payer: BC Managed Care – PPO | Admitting: Certified Registered Nurse Anesthetist

## 2013-09-24 ENCOUNTER — Encounter (HOSPITAL_COMMUNITY)
Admission: RE | Disposition: A | Discharge status: Home / Self Care | Payer: Self-pay | Source: Ambulatory Visit | Attending: Plastic Surgery

## 2013-09-24 ENCOUNTER — Inpatient Hospital Stay (HOSPITAL_COMMUNITY)
Admission: RE | Admit: 2013-09-24 | Discharge: 2013-09-26 | DRG: 941 | Disposition: A | Discharge status: Home / Self Care | Payer: BC Managed Care – PPO | Source: Ambulatory Visit | Attending: Plastic Surgery | Admitting: Plastic Surgery

## 2013-09-24 ENCOUNTER — Encounter (HOSPITAL_COMMUNITY): Payer: BC Managed Care – PPO | Admitting: Certified Registered Nurse Anesthetist

## 2013-09-24 DIAGNOSIS — C50919 Malignant neoplasm of unspecified site of unspecified female breast: Secondary | ICD-10-CM | POA: Diagnosis not present

## 2013-09-24 DIAGNOSIS — Z9010 Acquired absence of unspecified breast and nipple: Principal | ICD-10-CM

## 2013-09-24 DIAGNOSIS — Z9221 Personal history of antineoplastic chemotherapy: Secondary | ICD-10-CM

## 2013-09-24 DIAGNOSIS — Z9013 Acquired absence of bilateral breasts and nipples: Secondary | ICD-10-CM

## 2013-09-24 HISTORY — PX: TISSUE EXPANDER PLACEMENT: SHX2530

## 2013-09-24 HISTORY — PX: RECONSTRUCTION BREAST W/ LATISSIMUS DORSI FLAP: SUR1078

## 2013-09-24 HISTORY — PX: LATISSIMUS FLAP TO BREAST: SHX5357

## 2013-09-24 HISTORY — PX: BREAST RECONSTRUCTION WITH PLACEMENT OF TISSUE EXPANDER AND FLEX HD (ACELLULAR HYDRATED DERMIS): SHX6295

## 2013-09-24 LAB — CBC
HCT: 33.3 % — ABNORMAL LOW (ref 36.0–46.0)
Hemoglobin: 12 g/dL (ref 12.0–15.0)
MCH: 30.5 pg (ref 26.0–34.0)
MCHC: 36 g/dL (ref 30.0–36.0)
MCV: 84.7 fL (ref 78.0–100.0)
Platelets: 157 10*3/uL (ref 150–400)
RBC: 3.93 MIL/uL (ref 3.87–5.11)
RDW: 12 % (ref 11.5–15.5)
WBC: 8 10*3/uL (ref 4.0–10.5)

## 2013-09-24 LAB — BASIC METABOLIC PANEL
BUN: 10 mg/dL (ref 6–23)
CO2: 20 mEq/L (ref 19–32)
Calcium: 8.4 mg/dL (ref 8.4–10.5)
Chloride: 104 mEq/L (ref 96–112)
Creatinine, Ser: 0.58 mg/dL (ref 0.50–1.10)
GFR calc Af Amer: >90 mL/min (ref >90)
GLUCOSE: 137 mg/dL — AB (ref 70–99)
Potassium: 4.2 mEq/L (ref 3.7–5.3)
SODIUM: 140 meq/L (ref 137–147)

## 2013-09-24 SURGERY — RECONSTRUCTION, BREAST, USING LATISSIMUS DORSI MYOCUTANEOUS FLAP
Anesthesia: General | Laterality: Right

## 2013-09-24 MED ORDER — ONDANSETRON HCL 4 MG/2ML IJ SOLN: 4.0000 mg | Freq: Four times a day (QID) | INTRAMUSCULAR | Status: DC | PRN

## 2013-09-24 MED ORDER — PROPOFOL 10 MG/ML IV BOLUS
INTRAVENOUS | Status: AC
  Filled 2013-09-24: qty 20

## 2013-09-24 MED ORDER — DOCUSATE SODIUM 100 MG PO CAPS
100.0000 mg | ORAL_CAPSULE | Freq: Three times a day (TID) | ORAL | Status: DC
  Administered 2013-09-24 – 2013-09-26 (×5): 100 mg via ORAL
  Filled 2013-09-24 (×5): qty 1

## 2013-09-24 MED ORDER — SCOPOLAMINE 1 MG/3DAYS TD PT72
MEDICATED_PATCH | TRANSDERMAL | Status: DC | PRN
  Administered 2013-09-24: 1 via TRANSDERMAL

## 2013-09-24 MED ORDER — ONDANSETRON HCL 4 MG/2ML IJ SOLN
INTRAMUSCULAR | Status: AC
Start: 2013-09-24 — End: 2013-09-24
  Filled 2013-09-24: qty 2

## 2013-09-24 MED ORDER — HYDROCODONE-ACETAMINOPHEN 7.5-325 MG/15ML PO SOLN
10.0000 mL | Freq: Four times a day (QID) | ORAL | Status: DC | PRN
  Administered 2013-09-25 – 2013-09-26 (×3): 10 mL via ORAL
  Filled 2013-09-24 (×3): qty 15

## 2013-09-24 MED ORDER — SODIUM CHLORIDE 0.9 % IJ SOLN: 9.0000 mL | INTRAMUSCULAR | Status: DC | PRN

## 2013-09-24 MED ORDER — LIDOCAINE HCL (CARDIAC) 20 MG/ML IV SOLN
INTRAVENOUS | Status: DC | PRN
Start: 2013-09-24 — End: 2013-09-24
  Administered 2013-09-24: 100 mg via INTRAVENOUS

## 2013-09-24 MED ORDER — FENTANYL 10 MCG/ML IV SOLN
INTRAVENOUS | Status: DC
  Administered 2013-09-24: 18:00:00 via INTRAVENOUS
  Administered 2013-09-24: 280 ug via INTRAVENOUS
  Administered 2013-09-25: 70 ug via INTRAVENOUS
  Administered 2013-09-25: 110 ug via INTRAVENOUS
  Filled 2013-09-24 (×3): qty 50

## 2013-09-24 MED ORDER — 0.9 % SODIUM CHLORIDE (POUR BTL) OPTIME
TOPICAL | Status: DC | PRN
  Administered 2013-09-24 (×3): 1000 mL

## 2013-09-24 MED ORDER — LACTATED RINGERS IV SOLN
INTRAVENOUS | Status: DC | PRN
  Administered 2013-09-24 (×3): via INTRAVENOUS

## 2013-09-24 MED ORDER — MIDAZOLAM HCL 5 MG/5ML IJ SOLN
INTRAMUSCULAR | Status: DC | PRN
  Administered 2013-09-24: 2 mg via INTRAVENOUS

## 2013-09-24 MED ORDER — DIPHENHYDRAMINE HCL 12.5 MG/5ML PO ELIX: 12.5000 mg | ORAL_SOLUTION | Freq: Four times a day (QID) | ORAL | Status: DC | PRN

## 2013-09-24 MED ORDER — ARTIFICIAL TEARS OP OINT
TOPICAL_OINTMENT | OPHTHALMIC | Status: DC | PRN
  Administered 2013-09-24: 1 via OPHTHALMIC

## 2013-09-24 MED ORDER — DEXAMETHASONE SODIUM PHOSPHATE 4 MG/ML IJ SOLN
INTRAMUSCULAR | Status: AC
  Filled 2013-09-24: qty 2

## 2013-09-24 MED ORDER — FENTANYL CITRATE 0.05 MG/ML IJ SOLN
INTRAMUSCULAR | Status: AC
  Filled 2013-09-24: qty 5

## 2013-09-24 MED ORDER — BUPIVACAINE-EPINEPHRINE (PF) 0.25% -1:200000 IJ SOLN
INTRAMUSCULAR | Status: AC
  Filled 2013-09-24: qty 30

## 2013-09-24 MED ORDER — FENTANYL CITRATE 0.05 MG/ML IJ SOLN
INTRAMUSCULAR | Status: AC
  Filled 2013-09-24: qty 2

## 2013-09-24 MED ORDER — BUPIVACAINE-EPINEPHRINE 0.25% -1:200000 IJ SOLN
INTRAMUSCULAR | Status: DC | PRN
  Administered 2013-09-24: 30 mL

## 2013-09-24 MED ORDER — VECURONIUM BROMIDE 10 MG IV SOLR
INTRAVENOUS | Status: DC | PRN
  Administered 2013-09-24: 1 mg via INTRAVENOUS
  Administered 2013-09-24: 2 mg via INTRAVENOUS
  Administered 2013-09-24: 1 mg via INTRAVENOUS

## 2013-09-24 MED ORDER — SCOPOLAMINE 1 MG/3DAYS TD PT72
MEDICATED_PATCH | TRANSDERMAL | Status: AC
  Filled 2013-09-24: qty 1

## 2013-09-24 MED ORDER — SODIUM CHLORIDE 0.9 % IV SOLN
3.0000 g | Freq: Four times a day (QID) | INTRAVENOUS | Status: DC
  Administered 2013-09-24 – 2013-09-26 (×7): 3 g via INTRAVENOUS
  Filled 2013-09-24 (×9): qty 3

## 2013-09-24 MED ORDER — ROCURONIUM BROMIDE 100 MG/10ML IV SOLN
INTRAVENOUS | Status: DC | PRN
  Administered 2013-09-24: 40 mg via INTRAVENOUS
  Administered 2013-09-24: 10 mg via INTRAVENOUS

## 2013-09-24 MED ORDER — EVICEL 5 ML EX KIT
PACK | CUTANEOUS | Status: AC
  Filled 2013-09-24: qty 1

## 2013-09-24 MED ORDER — DEXAMETHASONE SODIUM PHOSPHATE 4 MG/ML IJ SOLN
INTRAMUSCULAR | Status: DC | PRN
  Administered 2013-09-24: 8 mg via INTRAVENOUS

## 2013-09-24 MED ORDER — TRIAMCINOLONE ACETONIDE 55 MCG/ACT NA AERO
2.0000 | INHALATION_SPRAY | Freq: Every day | NASAL | Status: DC
  Administered 2013-09-25 – 2013-09-26 (×2): 2 via NASAL
  Filled 2013-09-24: qty 21.6

## 2013-09-24 MED ORDER — DIPHENHYDRAMINE HCL 50 MG/ML IJ SOLN: 12.5000 mg | Freq: Four times a day (QID) | INTRAMUSCULAR | Status: DC | PRN

## 2013-09-24 MED ORDER — NEOSTIGMINE METHYLSULFATE 1 MG/ML IJ SOLN
INTRAMUSCULAR | Status: DC | PRN
  Administered 2013-09-24: 3 mg via INTRAVENOUS

## 2013-09-24 MED ORDER — SODIUM CHLORIDE 0.9 % IR SOLN
Status: DC | PRN
  Administered 2013-09-24: 13:00:00

## 2013-09-24 MED ORDER — PROPOFOL 10 MG/ML IV BOLUS
INTRAVENOUS | Status: DC | PRN
  Administered 2013-09-24: 160 mg via INTRAVENOUS

## 2013-09-24 MED ORDER — GLYCOPYRROLATE 0.2 MG/ML IJ SOLN
INTRAMUSCULAR | Status: DC | PRN
  Administered 2013-09-24: 0.4 mg via INTRAVENOUS

## 2013-09-24 MED ORDER — ONDANSETRON HCL 4 MG/2ML IJ SOLN
INTRAMUSCULAR | Status: DC | PRN
  Administered 2013-09-24: 4 mg via INTRAVENOUS

## 2013-09-24 MED ORDER — LIDOCAINE HCL (CARDIAC) 20 MG/ML IV SOLN
INTRAVENOUS | Status: AC
  Filled 2013-09-24: qty 5

## 2013-09-24 MED ORDER — DIAZEPAM 2 MG PO TABS: 2.0000 mg | ORAL_TABLET | Freq: Two times a day (BID) | ORAL | Status: DC | PRN

## 2013-09-24 MED ORDER — KCL IN DEXTROSE-NACL 20-5-0.45 MEQ/L-%-% IV SOLN
INTRAVENOUS | Status: DC
  Administered 2013-09-25 – 2013-09-26 (×2): via INTRAVENOUS
  Filled 2013-09-24 (×8): qty 1000

## 2013-09-24 MED ORDER — KCL IN DEXTROSE-NACL 20-5-0.45 MEQ/L-%-% IV SOLN
INTRAVENOUS | Status: AC
  Filled 2013-09-24: qty 1000

## 2013-09-24 MED ORDER — GLYCOPYRROLATE 0.2 MG/ML IJ SOLN
INTRAMUSCULAR | Status: AC
  Filled 2013-09-24: qty 2

## 2013-09-24 MED ORDER — ARTIFICIAL TEARS OP OINT
TOPICAL_OINTMENT | OPHTHALMIC | Status: AC
  Filled 2013-09-24: qty 3.5

## 2013-09-24 MED ORDER — LACTATED RINGERS IV SOLN
INTRAVENOUS | Status: DC
  Administered 2013-09-24: 11:00:00 via INTRAVENOUS

## 2013-09-24 MED ORDER — VECURONIUM BROMIDE 10 MG IV SOLR
INTRAVENOUS | Status: AC
  Filled 2013-09-24: qty 10

## 2013-09-24 MED ORDER — FENTANYL CITRATE 0.05 MG/ML IJ SOLN
INTRAMUSCULAR | Status: DC | PRN
  Administered 2013-09-24 (×3): 50 ug via INTRAVENOUS
  Administered 2013-09-24: 150 ug via INTRAVENOUS
  Administered 2013-09-24 (×2): 50 ug via INTRAVENOUS
  Administered 2013-09-24: 100 ug via INTRAVENOUS

## 2013-09-24 MED ORDER — BISACODYL 10 MG RE SUPP: 10.0000 mg | Freq: Every day | RECTAL | Status: DC | PRN

## 2013-09-24 MED ORDER — NEOSTIGMINE METHYLSULFATE 1 MG/ML IJ SOLN
INTRAMUSCULAR | Status: AC
  Filled 2013-09-24: qty 10

## 2013-09-24 MED ORDER — EVICEL 5 ML EX KIT
PACK | CUTANEOUS | Status: DC | PRN
  Administered 2013-09-24 (×2): 5 mL

## 2013-09-24 MED ORDER — SENNOSIDES-DOCUSATE SODIUM 8.6-50 MG PO TABS: 1.0000 | ORAL_TABLET | Freq: Every evening | ORAL | Status: DC | PRN

## 2013-09-24 MED ORDER — OXYCODONE HCL ER 10 MG PO T12A
10.0000 mg | EXTENDED_RELEASE_TABLET | Freq: Two times a day (BID) | ORAL | Status: DC
  Administered 2013-09-24 – 2013-09-26 (×4): 10 mg via ORAL
  Filled 2013-09-24 (×4): qty 1

## 2013-09-24 MED ORDER — BUPIVACAINE-EPINEPHRINE 0.25% -1:200000 IJ SOLN
INTRAMUSCULAR | Status: DC | PRN
  Administered 2013-09-24: 15 mL

## 2013-09-24 MED ORDER — NALOXONE HCL 0.4 MG/ML IJ SOLN: 0.4000 mg | INTRAMUSCULAR | Status: DC | PRN

## 2013-09-24 MED ORDER — EVICEL 2 ML EX KIT
PACK | CUTANEOUS | Status: AC
  Filled 2013-09-24: qty 1

## 2013-09-24 MED ORDER — FENTANYL CITRATE 0.05 MG/ML IJ SOLN
25.0000 ug | INTRAMUSCULAR | Status: DC | PRN
  Administered 2013-09-24: 25 ug via INTRAVENOUS
  Administered 2013-09-24: 50 ug via INTRAVENOUS
  Administered 2013-09-24: 25 ug via INTRAVENOUS
  Administered 2013-09-24: 50 ug via INTRAVENOUS

## 2013-09-24 MED ORDER — MIDAZOLAM HCL 2 MG/2ML IJ SOLN
INTRAMUSCULAR | Status: AC
  Filled 2013-09-24: qty 2

## 2013-09-24 MED ORDER — ROCURONIUM BROMIDE 50 MG/5ML IV SOLN
INTRAVENOUS | Status: AC
  Filled 2013-09-24: qty 1

## 2013-09-24 SURGICAL SUPPLY — 76 items
APPLIER CLIP 9.375 MED OPEN (MISCELLANEOUS) ×3
BAG DECANTER FOR FLEXI CONT (MISCELLANEOUS) ×3 IMPLANT
BINDER BREAST LRG (GAUZE/BANDAGES/DRESSINGS) IMPLANT
BINDER BREAST MEDIUM (GAUZE/BANDAGES/DRESSINGS) ×3 IMPLANT
BINDER BREAST XLRG (GAUZE/BANDAGES/DRESSINGS) IMPLANT
BIOPATCH RED 1 DISK 7.0 (GAUZE/BANDAGES/DRESSINGS) ×6 IMPLANT
BLADE SURG 10 STRL SS (BLADE) ×3 IMPLANT
BLADE SURG 15 STRL LF DISP TIS (BLADE) ×2 IMPLANT
BLADE SURG 15 STRL SS (BLADE) ×1
BNDG COHESIVE 4X5 TAN STRL (GAUZE/BANDAGES/DRESSINGS) IMPLANT
CANISTER SUCTION 2500CC (MISCELLANEOUS) ×3 IMPLANT
CHLORAPREP W/TINT 26ML (MISCELLANEOUS) ×3 IMPLANT
CLIP APPLIE 9.375 MED OPEN (MISCELLANEOUS) ×2 IMPLANT
COVER SURGICAL LIGHT HANDLE (MISCELLANEOUS) ×6 IMPLANT
DERMABOND ADVANCED (GAUZE/BANDAGES/DRESSINGS) ×3
DERMABOND ADVANCED .7 DNX12 (GAUZE/BANDAGES/DRESSINGS) ×6 IMPLANT
DRAIN CHANNEL 19F RND (DRAIN) ×12 IMPLANT
DRAPE INCISE 23X17 IOBAN STRL (DRAPES)
DRAPE INCISE IOBAN 23X17 STRL (DRAPES) IMPLANT
DRAPE INCISE IOBAN 85X60 (DRAPES) IMPLANT
DRAPE ORTHO SPLIT 77X108 STRL (DRAPES) ×2
DRAPE PROXIMA HALF (DRAPES) ×6 IMPLANT
DRAPE SURG 17X23 STRL (DRAPES) ×12 IMPLANT
DRAPE SURG ORHT 6 SPLT 77X108 (DRAPES) ×4 IMPLANT
DRAPE WARM FLUID 44X44 (DRAPE) ×3 IMPLANT
DRSG MEPILEX BORDER 4X8 (GAUZE/BANDAGES/DRESSINGS) ×3 IMPLANT
DRSG PAD ABDOMINAL 8X10 ST (GAUZE/BANDAGES/DRESSINGS) ×6 IMPLANT
ELECT BLADE 4.0 EZ CLEAN MEGAD (MISCELLANEOUS) ×3
ELECT BLADE 6.5 EXT (BLADE) ×3 IMPLANT
ELECT CAUTERY BLADE 6.4 (BLADE) ×9 IMPLANT
ELECT REM PT RETURN 9FT ADLT (ELECTROSURGICAL) ×3
ELECTRODE BLDE 4.0 EZ CLN MEGD (MISCELLANEOUS) ×2 IMPLANT
ELECTRODE REM PT RTRN 9FT ADLT (ELECTROSURGICAL) ×2 IMPLANT
EVACUATOR SILICONE 100CC (DRAIN) ×12 IMPLANT
EXPANDER BREAST 275CC (Breast) ×6 IMPLANT
GAUZE XEROFORM 5X9 LF (GAUZE/BANDAGES/DRESSINGS) IMPLANT
GLOVE BIO SURGEON STRL SZ 6.5 (GLOVE) IMPLANT
GLOVE SURG SS PI 6.5 STRL IVOR (GLOVE) ×24 IMPLANT
GOWN STRL REUS W/ TWL LRG LVL3 (GOWN DISPOSABLE) ×8 IMPLANT
GOWN STRL REUS W/TWL LRG LVL3 (GOWN DISPOSABLE) ×4
GRAFT FLEX HD 4X16 THICK (Tissue Mesh) ×3 IMPLANT
KIT BASIN OR (CUSTOM PROCEDURE TRAY) ×3 IMPLANT
KIT ROOM TURNOVER OR (KITS) ×3 IMPLANT
NS IRRIG 1000ML POUR BTL (IV SOLUTION) ×12 IMPLANT
PACK GENERAL/GYN (CUSTOM PROCEDURE TRAY) ×3 IMPLANT
PAD ABD 8X10 STRL (GAUZE/BANDAGES/DRESSINGS) ×6 IMPLANT
PAD ARMBOARD 7.5X6 YLW CONV (MISCELLANEOUS) ×9 IMPLANT
PENCIL BUTTON HOLSTER BLD 10FT (ELECTRODE) ×3 IMPLANT
PIN SAFETY STERILE (MISCELLANEOUS) ×3 IMPLANT
SET ASEPTIC TRANSFER (MISCELLANEOUS) ×3 IMPLANT
SET COLLECT BLD 21X3/4 12 PB (MISCELLANEOUS) IMPLANT
SPONGE GAUZE 4X4 12PLY (GAUZE/BANDAGES/DRESSINGS) ×6 IMPLANT
SPONGE LAP 18X18 X RAY DECT (DISPOSABLE) ×3 IMPLANT
STAPLER VISISTAT 35W (STAPLE) ×3 IMPLANT
STOCKINETTE IMPERVIOUS 9X36 MD (GAUZE/BANDAGES/DRESSINGS) IMPLANT
STOPCOCK 4 WAY LG BORE MALE ST (IV SETS) IMPLANT
STRIP CLOSURE SKIN 1/2X4 (GAUZE/BANDAGES/DRESSINGS) ×6 IMPLANT
SUT ETHILON 2 0 FS 18 (SUTURE) ×6 IMPLANT
SUT MNCRL AB 3-0 PS2 18 (SUTURE) ×12 IMPLANT
SUT MNCRL AB 4-0 PS2 18 (SUTURE) ×15 IMPLANT
SUT MON AB 5-0 PS2 18 (SUTURE) ×12 IMPLANT
SUT PDS AB 2-0 CT1 27 (SUTURE) ×12 IMPLANT
SUT PDS AB 3-0 SH 27 (SUTURE) ×12 IMPLANT
SUT SILK 3 0 SH 30 (SUTURE) ×12 IMPLANT
SUT VIC AB 3-0 PS2 18 (SUTURE) ×4
SUT VIC AB 3-0 PS2 18XBRD (SUTURE) ×8 IMPLANT
SUT VIC AB 3-0 SH 27 (SUTURE) ×3
SUT VIC AB 3-0 SH 27X BRD (SUTURE) ×6 IMPLANT
SUT VIC AB 3-0 SH 8-18 (SUTURE) ×3 IMPLANT
SUT VICRYL 4-0 PS2 18IN ABS (SUTURE) ×6 IMPLANT
SYR 50ML SLIP (SYRINGE) IMPLANT
SYR BULB IRRIGATION 50ML (SYRINGE) ×3 IMPLANT
TOWEL OR 17X24 6PK STRL BLUE (TOWEL DISPOSABLE) ×9 IMPLANT
TOWEL OR 17X26 10 PK STRL BLUE (TOWEL DISPOSABLE) ×6 IMPLANT
TRAY FOLEY CATH 14FRSI W/METER (CATHETERS) ×3 IMPLANT
TUBE CONNECTING 12X1/4 (SUCTIONS) ×3 IMPLANT

## 2013-09-24 NOTE — Addendum Note (Signed)
Addendum created 09/24/13 1709 by Finis Bud, MD   Modules edited: Orders, PRL Based Order Sets

## 2013-09-24 NOTE — Progress Notes (Signed)
Dr. Migdalia Dk is accepting of the order for OR consent that was made on 09/12/2013.

## 2013-09-24 NOTE — H&P (View-Only) (Signed)
Sherry Barry is an 43 y.o. female.   Chief Complaint: Acquied absence of breasts HPI: The patient is a 43 yrs old female here for history and physical for breast reconstruction after bilateral mastectomies for treatment of right breast cancer. 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 A repeat mammogram in 2013 showed a decrease in size of the benign fibroadenoma in the left breast compared to 2010. However, there were pleomorphic calcifications and thickening in the right breast. An ultrasound of the right breast (1/14) showed multiple hypoechoic masses. 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 Bilateral breast MRI (1/14) showed enhancing masses. The biopsy of the left breast (01/14) showed no evidence of malignancy. Genetics were negative.  She patient underwent bilateral mastectomies on 06/2012. The left mastectomy was negative. The right mastectomy revealed 2 tumors: #1) invasive ductal carcinoma, grade 3, and DCIS, grade 3, with comedo necrosis. Lymphovascular invasion was identified; #2) a ductal carcinoma in situ, grade 3, with comedo necrosis. Surgical margins were negative. One of the four lymph nodes were positive. She received Trastuzumab and Tamoxifen. She has a midline abdominal incision. Her abdomen is small and the skin if floppy. She finished radiation on the right September 2014. She is 5 feet 2 inches tall, weighs 137 pounds and wore a 34DD bra.    Past Medical History  Diagnosis Date  . Allergy   . Migraine   . Migraine   . History of migraine     last one about a week ago  . History of chemotherapy     doxetaxel/carboplatin/trastuzumab  . Breast cancer   . Breast cancer     right  . Hx of radiation therapy 01/01/13- 02/15/13    r chest wall, r supraclav/axilla 5040  cGy/28 sessions, scar boost 1000 cGy/5 sessions  . Hypertension     no meds,   urgent care on pomana    Past Surgical History  Procedure Laterality Date  . Simple mastectomy with axillary sentinel node biopsy  07/14/2012    Procedure: SIMPLE MASTECTOMY WITH AXILLARY SENTINEL NODE BIOPSY;  Surgeon: Thomas A. Cornett, MD;  Location: [facility];  Service: General;  Laterality: Right;  Bilateral simple mastectomy with port and right sebtibel lymph node mapping  . Simple mastectomy with axillary sentinel node biopsy  07/14/2012    Procedure: SIMPLE MASTECTOMY;  Surgeon: Thomas A. Cornett, MD;  Location: [facility];  Service: General;  Laterality: Left;  . Portacath placement  07/14/2012    Procedure: INSERTION PORT-A-CATH;  Surgeon: Thomas A. Cornett, MD;  Location: [facility];  Service: General;  Laterality: Left;  . Abdominal hysterectomy      no salpingo-oophorectomy    Family History  Problem Relation Age of Onset  . Hypertension Mother   . Alcohol abuse Mother   . Heart disease Maternal Grandmother   . Stroke Maternal Grandfather   . Colon cancer Maternal Aunt 55    alive at 61  . Brain cancer Maternal Uncle 47    and lymphoma in early 40s; deceased  . Brain cancer Maternal Uncle 60    deceased  . Pancreatic cancer Maternal Uncle 45    alive at 57  . Breast cancer Maternal Aunt     great aunt through mat GF; dx at ? age  . Breast   cancer Cousin 65    mat 1st cousin once removed through mat GF ; deceased   Social History:  reports that she has never smoked. She has never used smokeless tobacco. She reports that she drinks alcohol. She reports that she does not use illicit drugs.  Allergies:  Allergies  Allergen Reactions  . Codeine Itching  . Latex Swelling     (Not in a hospital admission)  No results found for this or any previous visit (from the past 48 hour(s)). No results found.  Review of Systems  Constitutional: Negative.   HENT: Negative.   Eyes: Negative.   Respiratory:  Negative.   Cardiovascular: Negative.   Gastrointestinal: Negative.   Genitourinary: Negative.   Musculoskeletal: Negative.   Skin: Negative.   Neurological: Negative.   Psychiatric/Behavioral: Negative.     There were no vitals taken for this visit. Physical Exam  Constitutional: She appears well-developed and well-nourished.  HENT:  Head: Normocephalic and atraumatic.  Eyes: Conjunctivae and EOM are normal. Pupils are equal, round, and reactive to light.  Respiratory: Effort normal.  Neurological: She is alert.  Skin: Skin is warm.  Psychiatric: She has a normal mood and affect. Her behavior is normal. Judgment and thought content normal.     Assessment/Plan Plan for right latissimus myocutaneous flap with expander placement and left expander and FlexHD placement.   Natalye Kott Sanger 09/21/2013, 10:32 AM    

## 2013-09-24 NOTE — Op Note (Signed)
DATE OF OPERATION: 09/24/2013  LOCATION: [facility]  PREOPERATIVE DIAGNOSIS: Breast Cancer s/p bilateral mastectomies   POSTOPERATIVE DIAGNOSIS: Same  PROCEDURE: 1. Latissimus myocutaneous flap  to reconstruct the right breast CPT 19361 2. Tissue expander placement to the right breast CPT 19357 (150 cc of normal saline placed) 3. Tissue expander placement to the left breast (150 cc of normal saline placed) 4. ADM (FlexHD) placement to the left breast  SURGEON: Lyndee Leo Sanger  ASSISTANT: Shawn Rayburn, PA  EBL: 200 cc  SPECIMEN: None  IMPLANTS: Left - Mentor Medium Height 275cc. Right - Mentor Medium Height  275cc. Acellular Dermal Matrix 4 x 16 cm  DRAINS: 4 total 53 blake round drains  CONDITION: Stable  COMPLICATIONS: None  INDICATION: The patient, Sherry Barry, is a 43 y.o. female born on 1970/12/25, is here for treatment after bilateral mastectomies and right sided radiation.   PROCEDURE DETAILS:  The patient was seen on the morning of her surgery and marked out for her flap.  She was given an IV and IV antibiotics. She was then taken to the operating room and given a general anesthetic. A standard time out was performed and all information was confirmed by those in the room. She has SCD's and a foley catheter placed. She was placed into the left lateral decubitus position with all key points padded. She was then prepped and draped in the standard sterile fashion using a chloroprep. The paddle design and position was confirmed. The procedure began by incising the margins of the paddle and dissecting out until all four margins of the muscle were identified. The muscle was then released inferiorly and anteriorly and care was taken not to pick up the serratus anteriorly or the paraspinous muscles posteriorly. The flap was then raised to the scapula and released and rotated medially. Care was taken to protect the vascular pedicle throughout this portion of the procedure. The paddle  and muscle looked healthy throughout the case.  The old mastectomy scar was excised and the flaps on top of the pectoralis muscle were raised.  The posterior and anterior pockets were then connected in the plane above the muscle. The muscle from the back and the skin paddle were then rotated into the chest pocket and covered with an ioban dressing. The flap pedicle was inspected and there was no tension. The back pocket was hemostased and Evicel placed. Two #19 blake round drains were place and secured with 3-0 Silk. The back incision was closed with buried 3-0 Vicryl, followed by 4-0 Monocryl and 5-0 Monocryl.  Dermabond and a protective dressing was applied.   The patient was then repositioned onto her back and the chest was prepped and draped with a betadine. The breast pocket was inspected and hemostases was achieved with electrocautery. The muscle was then secured superiorly to the pectoralis muscle with 3-0 Monocryl. A 275 cc expander was chosen. It was soaked in triple antibiotic solution and evacuated of air and then filled with 150 cc of sterile saline. The inferior portion of the muscle was tacked to the inframammary fold with 3-0 Silk.  One drain was placed on this side and secured with 3-0 Silk. The flap was then closed with 3-0 Monocryl deep, followed by 4-0 Monocryl and the skin closed with 5-0 Monocryl.    The left mastectomy scar was excised and sent to pathology. The mastectomy flaps were lifted over the pectoralis muscle. The pectoralis major muscle was lifted from the chest wall with release of the lateral  edge and lateral inframammary fold.  The pocket was irrigated with antibiotic solution and hemostasis was achieved with electrocautery.  The ADM was then prepared according to the manufacture guidelines and slits placed to help with postoperative fluid management.  The ADM was then sutured to the inferior and lateral edge of the inframammary fold with 2-0 PDS starting with an interrupted  stitch and then a running stitch.  The lateral portion was sutured to with interrupted sutures after the expander was placed.  The expander was prepared according to the manufacture guidelines, the air evacuated and then it was placed under the ADM and pectoralis major muscle.  The inferior and lateral tabs were used to secure the expander to the chest wall with 2-0 PDS.  The expander was inflated with 150 cc of injectable saline. The drain was placed at the inframammary fold over the ADM and secured to the skin with 3-0 Silk.  The deep layers were closed with 3-0 Vicryl followed by 4-0 Monocryl.  The skin was closed with 5-0 Monocryl and then dermabond was applied.    The ABDs and breast binder were placed.  The patient tolerated the procedure well and there were no complications.  The patient was allowed to wake from anesthesia and taken to the recovery room in satisfactory condition.            The remaining skin was closed with 4-0 Monocryl deep dermal and 5-0 Monocryl subcuticular stitches.  Dermabond was applied.  A breast binder and ABD was applied.  The patient was allowed to wake from anesthesia and taken to the recovery room in satisfactory condition.          Dermabond, ABDs and a breast binder was applied.    The patient was allowed to wake up and taken to recovery room in stable condition at the end of the case. The family was notified at the end of the case.

## 2013-09-24 NOTE — Brief Op Note (Signed)
09/24/2013  4:36 PM  PATIENT:  Sherry Barry  43 y.o. female  PRE-OPERATIVE DIAGNOSIS:  HISTORY OF BREAST CANCER,ACQUIRED OF ABSENT  OF BILATERAL BREAST  POST-OPERATIVE DIAGNOSIS:  HISTORY OF BREAST CANCER,ACQUIRED OF ABSENT  OF BILATERAL BREAST  PROCEDURE:  Procedure(s): RIGHT LATISSMUS MYOCUTAEIOUS MUSCLE FLAP AND PLACEMENT OF TISSUE EXPLANDER (Right) PLACEMENT OF TISSUE EXPANDER AND FLEX HD TO LEFT BREAST (Left)  SURGEON:  Surgeon(s) and Role:    * [name], DO - Primary  PHYSICIAN ASSISTANT: Shawn Rayburn, PA  ASSISTANTS: none   ANESTHESIA:   general  EBL:  Total I/O In: 1000 [I.V.:1000] Out: 260 [Urine:160; Blood:100]  BLOOD ADMINISTERED:none  DRAINS: (4 total) Jackson-Pratt drain(s) with closed bulb suction in the back (2) and one in each breast pocket.   LOCAL MEDICATIONS USED:  NONE  SPECIMEN:  Source of Specimen:  left mastectomy scar  DISPOSITION OF SPECIMEN:  PATHOLOGY  COUNTS:  YES  TOURNIQUET:  * No tourniquets in log *  DICTATION: .Dragon Dictation  PLAN OF CARE: Admit to inpatient   PATIENT DISPOSITION:  PACU - hemodynamically stable.   Delay start of Pharmacological VTE agent (>24hrs) due to surgical blood loss or risk of bleeding: yes

## 2013-09-24 NOTE — Anesthesia Postprocedure Evaluation (Signed)
  Anesthesia Post-op Note  Patient: Sherry Barry  Procedure(s) Performed: Procedure(s): RIGHT LATISSMUS MYOCUTAEIOUS MUSCLE FLAP AND PLACEMENT OF TISSUE EXPLANDER (Right) PLACEMENT OF TISSUE EXPANDER AND FLEX HD TO LEFT BREAST (Left)  Patient Location: PACU  Anesthesia Type:General  Level of Consciousness: awake  Airway and Oxygen Therapy: Patient Spontanous Breathing  Post-op Pain: mild  Post-op Assessment: Post-op Vital signs reviewed  Post-op Vital Signs: Reviewed  Last Vitals:  Filed Vitals:   09/24/13 1644  BP: 102/88  Pulse: 87  Temp: 36.7 C  Resp: 23    Complications: No apparent anesthesia complications

## 2013-09-24 NOTE — Anesthesia Preprocedure Evaluation (Signed)
Anesthesia Evaluation  Patient identified by MRN, date of birth, ID band Patient awake    Reviewed: Allergy & Precautions, H&P , NPO status , Patient's Chart, lab work & pertinent test results  History of Anesthesia Complications (+) PONV  Airway Mallampati: II TM Distance: >3 FB Neck ROM: Full    Dental  (+) Teeth Intact, Dental Advisory Given   Pulmonary neg pulmonary ROS,          Cardiovascular hypertension,     Neuro/Psych  Headaches, Anxiety    GI/Hepatic negative GI ROS, Neg liver ROS,   Endo/Other  negative endocrine ROS  Renal/GU negative Renal ROS     Musculoskeletal negative musculoskeletal ROS (+)   Abdominal   Peds  Hematology negative hematology ROS (+)   Anesthesia Other Findings   Reproductive/Obstetrics negative OB ROS                           Anesthesia Physical Anesthesia Plan  ASA: II  Anesthesia Plan: General   Post-op Pain Management:    Induction:   Airway Management Planned: Oral ETT  Additional Equipment:   Intra-op Plan:   Post-operative Plan: Extubation in OR  Informed Consent: I have reviewed the patients History and Physical, chart, labs and discussed the procedure including the risks, benefits and alternatives for the proposed anesthesia with the patient or authorized representative who has indicated his/her understanding and acceptance.   Dental advisory given  Plan Discussed with:   Anesthesia Plan Comments:         Anesthesia Quick Evaluation

## 2013-09-24 NOTE — Transfer of Care (Signed)
Immediate Anesthesia Transfer of Care Note  Patient: Sherry Barry  Procedure(s) Performed: Procedure(s): RIGHT LATISSMUS MYOCUTAEIOUS MUSCLE FLAP AND PLACEMENT OF TISSUE EXPLANDER (Right) PLACEMENT OF TISSUE EXPANDER AND FLEX HD TO LEFT BREAST (Left)  Patient Location: PACU  Anesthesia Type:General  Level of Consciousness: awake, alert , oriented and patient cooperative  Airway & Oxygen Therapy: Patient Spontanous Breathing and Patient connected to nasal cannula oxygen  Post-op Assessment: Report given to PACU RN, Post -op Vital signs reviewed and stable and Patient moving all extremities X 4  Post vital signs: Reviewed and stable  Complications: No apparent anesthesia complications

## 2013-09-24 NOTE — Interval H&P Note (Signed)
History and Physical Interval Note:  09/24/2013 12:10 PM  Sherry Barry  has presented today for surgery, with the diagnosis of HISTORY OF BREAST CANCER,ACQUIRED OF ABSENT  OF BILATERAL BREAST  The various methods of treatment have been discussed with the patient and family. After consideration of risks, benefits and other options for treatment, the patient has consented to  Procedure(s): RIGHT LATISSMUS MYOCUTAEIOUS MUSCLE FLAP AND PLACEMENT OF TISSUE EXPLANDER (Right) PLACEMENT OF TISSUE EXPANDER AND FLEX HD TO LEFT BREAST (Left) as a surgical intervention .  The patient's history has been reviewed, patient examined, no change in status, stable for surgery.  I have reviewed the patient's chart and labs.  Questions were answered to the patient's satisfaction.     Lyndee Leo [employer]

## 2013-09-24 NOTE — Progress Notes (Signed)
Spoke with Jenita Seashore, Anesth., to report pt. BP's today & pt. Reveals that she was given antihypertensive at one time by Oncologist but her BP dropped too low & she was told to use QOD or when her BP rose.  She reports that she has not used any in the past year.

## 2013-09-24 NOTE — Progress Notes (Signed)
Patient admitted to [facility] room 01 from PACU. S/P right flap and left tissue expander. Dressing intact. JPx4 charged. Patient oriented to room. Call bell in reach. Husband at bedside.

## 2013-09-25 ENCOUNTER — Encounter (HOSPITAL_COMMUNITY): Payer: Self-pay | Admitting: General Practice

## 2013-09-25 MED ORDER — DIPHENHYDRAMINE HCL 12.5 MG/5ML PO ELIX
12.5000 mg | ORAL_SOLUTION | Freq: Three times a day (TID) | ORAL | Status: DC
  Administered 2013-09-25 – 2013-09-26 (×4): 12.5 mg via ORAL
  Filled 2013-09-25 (×7): qty 5

## 2013-09-25 MED ORDER — FENTANYL 12 MCG/HR TD PT72
12.5000 ug | MEDICATED_PATCH | TRANSDERMAL | Status: DC
  Administered 2013-09-25: 12.5 ug via TRANSDERMAL
  Filled 2013-09-25: qty 1

## 2013-09-25 MED ORDER — NAPROXEN 250 MG PO TABS
500.0000 mg | ORAL_TABLET | Freq: Two times a day (BID) | ORAL | Status: DC
  Administered 2013-09-25 – 2013-09-26 (×3): 500 mg via ORAL
  Filled 2013-09-25 (×5): qty 2

## 2013-09-25 MED ORDER — RANITIDINE HCL 150 MG/10ML PO SYRP
150.0000 mg | ORAL_SOLUTION | Freq: Two times a day (BID) | ORAL | Status: DC
  Administered 2013-09-25 – 2013-09-26 (×3): 150 mg via ORAL
  Filled 2013-09-25 (×4): qty 10

## 2013-09-25 MED ORDER — NAPROXEN SODIUM 550 MG PO TABS
550.0000 mg | ORAL_TABLET | Freq: Two times a day (BID) | ORAL | Status: DC
  Filled 2013-09-25 (×3): qty 1

## 2013-09-25 MED ORDER — LORATADINE 10 MG PO TABS
10.0000 mg | ORAL_TABLET | Freq: Every day | ORAL | Status: DC
  Administered 2013-09-25 – 2013-09-26 (×2): 10 mg via ORAL
  Filled 2013-09-25 (×3): qty 1

## 2013-09-25 MED ORDER — LACTATED RINGERS IV BOLUS (SEPSIS)
500.0000 mL | Freq: Once | INTRAVENOUS | Status: AC
  Administered 2013-09-25: 500 mL via INTRAVENOUS

## 2013-09-25 NOTE — Progress Notes (Signed)
1 Day Post-Op  Subjective: The patient is post operative day one after a right latissimus myocutaneous muscle flap with expander placement and left breast reconstruction with expander and flexHD placement.  She is doing well and tolerating po liquids.  Her diet is being advanced.    Objective: Vital signs in last 24 hours: Temp:  [98.1 F (36.7 C)-99.9 F (37.7 C)] 99.9 F (37.7 C) (04/14 0648) Pulse Rate:  [72-109] 109 (04/14 0648) Resp:  [15-24] 20 (04/14 0648) BP: (101-162)/(66-112) 101/66 mmHg (04/14 0648) SpO2:  [96 %-100 %] 97 % (04/14 0648) Weight:  [60.782 kg (134 lb)-62.143 kg (137 lb)] 60.782 kg (134 lb) (04/13 1834)    Intake/Output from previous day: 04/13 0701 - 04/14 0700 In: 2050 [I.V.:2050] Out: 929 [Urine:460; Drains:369; Blood:100] Intake/Output this shift:    General appearance: alert, cooperative and no distress Incision/Wound: warm and excellent capillary refill on flaps.  Lab Results:   Recent Labs  09/24/13 2121  WBC 8.0  HGB 12.0  HCT 33.3*  PLT 157   BMET  Recent Labs  09/24/13 2121  NA 140  K 4.2  CL 104  CO2 20  GLUCOSE 137*  BUN 10  CREATININE 0.58  CALCIUM 8.4   PT/INR No results found for this basename: LABPROT, INR,  in the last 72 hours ABG No results found for this basename: PHART, PCO2, PO2, HCO3,  in the last 72 hours  Studies/Results: No results found.  Anti-infectives: Anti-infectives   Start     Dose/Rate Route Frequency Ordered Stop   09/24/13 1900  Ampicillin-Sulbactam (UNASYN) 3 g in sodium chloride 0.9 % 100 mL IVPB     3 g 100 mL/hr over 60 Minutes Intravenous Every 6 hours 09/24/13 1835     09/24/13 1322  polymyxin B 500,000 Units, bacitracin 50,000 Units in sodium chloride irrigation 0.9 % 500 mL irrigation  Status:  Discontinued       As needed 09/24/13 1323 09/24/13 1640   09/24/13 0600  ceFAZolin (ANCEF) IVPB 2 g/50 mL premix     2 g 100 mL/hr over 30 Minutes Intravenous On call to O.R. 09/23/13 1330  09/24/13 1257      Assessment/Plan: s/p Procedure(s): RIGHT LATISSMUS MYOCUTAEIOUS MUSCLE FLAP AND PLACEMENT OF TISSUE EXPLANDER (Right) PLACEMENT OF TISSUE EXPANDER AND FLEX HD TO LEFT BREAST (Left) Advance diet, d/c PCA, walk in hall, add benadryl, antihistamine and zantac to help with the itching, fluid bolus.  LOS: 1 day    [employer] 09/25/2013

## 2013-09-25 NOTE — Progress Notes (Signed)
Fentanyl PCA  d/c'd per order. Wasted 45ml in sink. Witnessed by R. Melina Copa RN

## 2013-09-26 MED ORDER — HYDROCODONE-ACETAMINOPHEN 5-325 MG PO TABS: 1.0000 | ORAL_TABLET | ORAL | Status: DC | PRN

## 2013-09-26 MED ORDER — FENTANYL 12 MCG/HR TD PT72: 12.5000 ug | MEDICATED_PATCH | TRANSDERMAL | Status: DC

## 2013-09-26 MED ORDER — BISACODYL 10 MG RE SUPP: 10.0000 mg | Freq: Every day | RECTAL | Status: DC | PRN

## 2013-09-26 MED ORDER — DIPHENHYDRAMINE HCL 12.5 MG/5ML PO ELIX: 12.5000 mg | ORAL_SOLUTION | Freq: Three times a day (TID) | ORAL | Status: DC

## 2013-09-26 MED ORDER — CEPHALEXIN 500 MG PO CAPS: 500.0000 mg | ORAL_CAPSULE | Freq: Four times a day (QID) | ORAL | Status: DC

## 2013-09-26 MED ORDER — DSS 100 MG PO CAPS: 100.0000 mg | ORAL_CAPSULE | Freq: Three times a day (TID) | ORAL | Status: DC

## 2013-09-26 MED ORDER — SENNOSIDES-DOCUSATE SODIUM 8.6-50 MG PO TABS: 1.0000 | ORAL_TABLET | Freq: Every evening | ORAL | Status: DC | PRN

## 2013-09-26 MED ORDER — LORATADINE 10 MG PO TABS: 10.0000 mg | ORAL_TABLET | Freq: Every day | ORAL | Status: DC

## 2013-09-26 MED ORDER — DIAZEPAM 2 MG PO TABS: 2.0000 mg | ORAL_TABLET | Freq: Two times a day (BID) | ORAL | Status: DC | PRN

## 2013-09-26 NOTE — Discharge Summary (Signed)
Physician Discharge Summary  Patient ID: MERRIN MCVICKER MRN: [id number] DOB/AGE: 41-Sep-1972 41 y.o.  Admit date: 09/24/2013 Discharge date: 09/26/2013  Admission Diagnoses: Acquired absence of bilateral breasts and nipples  Discharge Diagnoses:  Active Problems:   Acquired absence of bilateral breasts and nipples   Discharged Condition: good  Hospital Course:  The patient is a 43 yrs old female here for history and physical for breast reconstruction after bilateral mastectomies for treatment of right breast cancer. 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 A repeat mammogram in 2013 showed a decrease in size of the benign fibroadenoma in the left breast compared to 2010. However, there were pleomorphic calcifications and thickening in the right breast. An ultrasound of the right breast (1/14) showed multiple hypoechoic masses. 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 Bilateral breast MRI (1/14) showed enhancing masses. The biopsy of the left breast (01/14) showed no evidence of malignancy. Genetics were negative.  She patient underwent bilateral mastectomies on 06/2012. The left mastectomy was negative. The right mastectomy revealed 2 tumors: #1) invasive ductal carcinoma, grade 3, and DCIS, grade 3, with comedo necrosis. Lymphovascular invasion was identified; #2) a ductal carcinoma in situ, grade 3, with comedo necrosis. Surgical margins were negative. One of the four lymph nodes were positive. She received Trastuzumab and Tamoxifen. She has a midline abdominal incision. Her abdomen is small and the skin if floppy. She finished radiation on the right September 2014. She is 5 feet 2 inches tall, weighs 137 pounds and wore a 34DD bra.  She was taken to the OR on 09/24/13 for right breast  latissimus myocutaneous flap reconstruction with tissue expander placement and left breast reconstruction with tissue expander and ADM placement under general anesthesia without complications. She had an expected post operative course without complications and is discharged home today with the assistance of family.   Consults: None   Treatments: surgery: Right breast latissimus myocutaneous flap reconstruction with placement of tissue expander and left breast reconstruction with placement of tissue expander and ADM per Dr. Migdalia Dk on 09/24/13.   Discharge Exam: Blood pressure 102/59, pulse 92, temperature 98.1 F (36.7 C), temperature source Oral, resp. rate 20, height '5\' 2"'  (1.575 m), weight 60.782 kg (134 lb), SpO2 98.00%. General appearance: alert, cooperative, appears stated age and no distress Back: Right back incision is clean, dry and intact and without signs of infection Resp: clear to auscultation bilaterally Breasts: Right latissimus flap is viable and without signs of infection or hematoma. Left breast flap healing well without signs of infection or hematoma Cardio: regular rate and rhythm  Disposition: 01-Home or Self Care  Discharge Orders   Future Appointments Provider Department Dept Phone   10/01/2013 11:00 AM [facility] [place] [facility] [phone]   10/01/2013 12:00 PM [place] [phone]   12/18/2013 9:00 AM [facility] [phone]   12/25/2013 9:30 AM Chauncey Cruel, MD [facility] [phone]   Future Orders Complete By Expires   Care order/instruction  As directed    Scheduling Instructions:   Discharge with drain log sheets       Medication List         bisacodyl 10  MG suppository  Commonly known as:  DULCOLAX  Place 1 suppository (10 mg total) rectally daily as needed for moderate  constipation.     cephALEXin 500 MG capsule  Commonly known as:  KEFLEX  Take 1 capsule (500 mg total) by mouth 4 (four) times daily.     diazepam 2 MG tablet  Commonly known as:  VALIUM  Take 1 tablet (2 mg total) by mouth every 12 (twelve) hours as needed for muscle spasms.     diclofenac 75 MG EC tablet  Commonly known as:  VOLTAREN  Take 1 tablet (75 mg total) by mouth 2 (two) times daily.     diphenhydrAMINE 12.5 MG/5ML elixir  Commonly known as:  BENADRYL  Take 5 mLs (12.5 mg total) by mouth every 8 (eight) hours.     DSS 100 MG Caps  Take 100 mg by mouth 3 (three) times daily.     fentaNYL 12 MCG/HR  Commonly known as:  DURAGESIC - dosed mcg/hr  Place 1 patch (12.5 mcg total) onto the skin every 3 (three) days.     HAIR/SKIN/NAILS PO  Take 1 tablet by mouth daily.     HYDROcodone-acetaminophen 5-325 MG per tablet  Commonly known as:  NORCO  Take 1-2 tablets by mouth every 4 (four) hours as needed for moderate pain.     loratadine 10 MG tablet  Commonly known as:  CLARITIN  Take 1 tablet (10 mg total) by mouth daily.     LORazepam 0.5 MG tablet  Commonly known as:  ATIVAN  Take 0.5-1 tablets (0.25-0.5 mg total) by mouth 2 (two) times daily as needed for anxiety (Nausea or vomiting).     pimecrolimus 1 % cream  Commonly known as:  ELIDEL  Apply topically 2 (two) times daily.     senna-docusate 8.6-50 MG per tablet  Commonly known as:  Senokot-S  Take 1 tablet by mouth at bedtime as needed for mild constipation.     tamoxifen 20 MG tablet  Commonly known as:  NOLVADEX  Take 20 mg by mouth daily.     triamcinolone 55 MCG/ACT Aero nasal inhaler  Commonly known as:  NASACORT  Place 2 sprays into the nose daily.           Follow-up Information   Follow up with [facility], DO In 1 week.   Specialty:  Plastic Surgery   Contact information:   [place] [address] [phone]       Signed: [facility] [phone]

## 2013-09-26 NOTE — Progress Notes (Signed)
Discharge instructions reviewed with patient. Questions answered re: meds, new and previous home meds. Reviewed process for emptying JP drains. Patient verbalizes understanding. Patient given printed AVS, prescriptions, drainage containers, and documentation sheets. Patient discharged to home via wheelchair. Accompanied by husband.

## 2013-09-26 NOTE — Progress Notes (Signed)
2 Days Post-Op  Subjective: Patient doing very well and pain under control. She feels ready to go home.  The right latissimus flap is healing well and viable and without signs of infection or hematoma.  The left breast flap is healing well and without signs of infection or hematoma.  The JP drainage is as expected.   Objective: Vital signs in last 24 hours: Temp:  [98 F (36.7 C)-99.2 F (37.3 C)] 98.1 F (36.7 C) (04/15 0557) Pulse Rate:  [84-101] 92 (04/15 0557) Resp:  [16-20] 20 (04/15 0557) BP: (102-118)/(59-82) 102/59 mmHg (04/15 0557) SpO2:  [96 %-100 %] 98 % (04/15 0557) Last BM Date: 09/24/13  Intake/Output from previous day: 04/14 0701 - 04/15 0700 In: 3313.6 [P.O.:680; I.V.:1933.6; IV Piggyback:700] Out: 2955 [Urine:2650; Drains:305] Intake/Output this shift: Total I/O In: 240 [P.O.:240] Out: 600 [Urine:550; Drains:50]  General appearance: alert, cooperative, appears stated age and no distress Resp: clear to auscultation bilaterally Cardio: regular rate and rhythm Bilateral flaps are viable and without signs of infection or hematoma  Lab Results:   Recent Labs  09/24/13 2121  WBC 8.0  HGB 12.0  HCT 33.3*  PLT 157   BMET  Recent Labs  09/24/13 2121  NA 140  K 4.2  CL 104  CO2 20  GLUCOSE 137*  BUN 10  CREATININE 0.58  CALCIUM 8.4   PT/INR No results found for this basename: LABPROT, INR,  in the last 72 hours ABG No results found for this basename: PHART, PCO2, PO2, HCO3,  in the last 72 hours  Studies/Results: No results found.  Anti-infectives: Anti-infectives   Start     Dose/Rate Route Frequency Ordered Stop   09/24/13 1900  Ampicillin-Sulbactam (UNASYN) 3 g in sodium chloride 0.9 % 100 mL IVPB     3 g 100 mL/hr over 60 Minutes Intravenous Every 6 hours 09/24/13 1835     09/24/13 1322  polymyxin B 500,000 Units, bacitracin 50,000 Units in sodium chloride irrigation 0.9 % 500 mL irrigation  Status:  Discontinued       As needed  09/24/13 1323 09/24/13 1640   09/24/13 0600  ceFAZolin (ANCEF) IVPB 2 g/50 mL premix     2 g 100 mL/hr over 30 Minutes Intravenous On call to O.R. 09/23/13 1330 09/24/13 1257      Assessment/Plan: s/p Procedure(s): RIGHT LATISSMUS MYOCUTAEIOUS MUSCLE FLAP AND PLACEMENT OF TISSUE EXPLANDER (Right) PLACEMENT OF TISSUE EXPANDER AND FLEX HD TO LEFT BREAST (Left) Discharge home today with family.  follow up in office next Tuesday  LOS: 2 days   [facility] Surgery [phone]

## 2013-09-27 NOTE — Discharge Summary (Signed)
Agree with the above plan 

## 2013-09-27 NOTE — Progress Notes (Signed)
Agree with the above

## 2013-10-01 ENCOUNTER — Encounter (HOSPITAL_COMMUNITY): Payer: BC Managed Care – PPO

## 2013-10-01 ENCOUNTER — Ambulatory Visit (HOSPITAL_COMMUNITY): Payer: BC Managed Care – PPO

## 2013-10-16 ENCOUNTER — Encounter (HOSPITAL_COMMUNITY): Payer: Self-pay

## 2013-10-16 ENCOUNTER — Ambulatory Visit (HOSPITAL_COMMUNITY)
Admission: RE | Admit: 2013-10-16 | Discharge: 2013-10-16 | Disposition: A | Discharge status: Home / Self Care | Payer: BC Managed Care – PPO | Source: Ambulatory Visit | Attending: Family Medicine | Admitting: Family Medicine

## 2013-10-16 ENCOUNTER — Ambulatory Visit (HOSPITAL_BASED_OUTPATIENT_CLINIC_OR_DEPARTMENT_OTHER)
Admission: RE | Admit: 2013-10-16 | Discharge: 2013-10-16 | Disposition: A | Discharge status: Home / Self Care | Payer: BC Managed Care – PPO | Source: Ambulatory Visit | Attending: Internal Medicine | Admitting: Internal Medicine

## 2013-10-16 VITALS — BP 126/82 | HR 92 | Wt 139.0 lb

## 2013-10-16 DIAGNOSIS — C50219 Malignant neoplasm of upper-inner quadrant of unspecified female breast: Secondary | ICD-10-CM

## 2013-10-16 DIAGNOSIS — C50919 Malignant neoplasm of unspecified site of unspecified female breast: Secondary | ICD-10-CM

## 2013-10-16 DIAGNOSIS — G43909 Migraine, unspecified, not intractable, without status migrainosus: Secondary | ICD-10-CM | POA: Insufficient documentation

## 2013-10-16 DIAGNOSIS — Z9221 Personal history of antineoplastic chemotherapy: Secondary | ICD-10-CM | POA: Insufficient documentation

## 2013-10-16 DIAGNOSIS — Z853 Personal history of malignant neoplasm of breast: Secondary | ICD-10-CM | POA: Insufficient documentation

## 2013-10-16 NOTE — Progress Notes (Signed)
Echocardiogram 2D Echocardiogram limited has been performed.  Sherry Barry 10/16/2013, 11:31 AM

## 2013-10-16 NOTE — Patient Instructions (Signed)
Your physician recommends that you schedule a follow-up appointment in: as needed  

## 2013-10-17 NOTE — Progress Notes (Signed)
Patient ID: Sherry Barry, female   DOB: Oct 30, 1970, 43 y.o.   MRN: [id number] HPI: Sherry Barry is a 43 y.o. 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  She is s/p bilateral mastectomy 07/14/12.  She is followed by Dr. Jana Hakim was started on adjuvant chemotherapy on 08/17/12 consisting of carboplatin, docetaxel and trastuzumab given every 3 weeks for 6 cycles.  She will then continue trastuzumab for 1 year.    Echo: 08/01/12: EF 60-65% Lat s' 11.4 10/30/12: EF 60-65% lat s' 11.7 02/07/13 EF 60% lat s' 11.4 04/30/13 EF 60%, lat s' 10.9, global strain -20.3 5/15 EF 60-65%, lateral S' 12.1, GLS -19.2%  Follow up: Doing well. Denies SOB, orthopnea, CP or edema. She completed Herceptin in 3/15.  Review of Systems:   Past Medical History  Diagnosis Date  . Allergy   . Migraine   . Migraine   . History of migraine     last one about a week ago  . History of chemotherapy     doxetaxel/carboplatin/trastuzumab  . Breast cancer   . Breast cancer     right  . Hx of radiation therapy 01/01/13- 02/15/13    r chest wall, r supraclav/axilla 5040 cGy/28 sessions, scar boost 1000 cGy/5 sessions  . Hypertension     no meds,   urgent care on pomana    Current Outpatient Prescriptions  Medication Sig Dispense Refill  . bisacodyl (DULCOLAX) 10 MG suppository Place 1 suppository (10 mg total) rectally daily as needed for moderate constipation.  12 suppository  0  . diazepam (VALIUM) 2 MG tablet Take 1 tablet (2 mg total) by mouth every 12 (twelve) hours as needed for muscle spasms.  30 tablet  0  . diclofenac (VOLTAREN) 75 MG EC tablet Take 1 tablet (75 mg total) by mouth 2 (two) times daily.  30 tablet  0  . diphenhydrAMINE (BENADRYL) 12.5 MG/5ML elixir Take 5 mLs (12.5 mg total) by mouth every 8 (eight) hours.  120 mL  0  . docusate sodium 100 MG CAPS Take 100 mg by mouth 3 (three) times daily.  10 capsule  0  . LORazepam (ATIVAN) 0.5 MG tablet  Take 0.5-1 tablets (0.25-0.5 mg total) by mouth 2 (two) times daily as needed for anxiety (Nausea or vomiting).  30 tablet  0  . Multiple Vitamins-Minerals (HAIR/SKIN/NAILS PO) Take 1 tablet by mouth daily.      . pimecrolimus (ELIDEL) 1 % cream Apply topically 2 (two) times daily.  60 g  1  . senna-docusate (SENOKOT-S) 8.6-50 MG per tablet Take 1 tablet by mouth at bedtime as needed for mild constipation.      . tamoxifen (NOLVADEX) 20 MG tablet Take 20 mg by mouth daily.      [name] triamcinolone (NASACORT) 55 MCG/ACT AERO nasal inhaler Place 2 sprays into the nose daily.        No current facility-administered medications for this encounter.   Allergies  Allergen Reactions  . Codeine Itching  . Latex Swelling   Filed Vitals:   10/16/13 1134  BP: 126/82  Pulse: 92  Weight: 139 lb (63.05 kg)  SpO2: 100%    PHYSICAL EXAM: General:  Well appearing. No respiratory difficulty HEENT: normal Neck: supple. no JVD. Carotids 2+ bilat; no bruits. No lymphadenopathy or thryomegaly appreciated. Cor: PMI nondisplaced. Regular rate & rhythm. No rubs, gallops or murmurs. L uuper chest scar  Lungs: clear Abdomen: soft, nontender, nondistended. No hepatosplenomegaly.  No bruits or masses. Good bowel sounds. Extremities: no cyanosis, clubbing, rash, edema Neuro: alert & oriented x 3, cranial nerves grossly intact. moves all 4 extremities w/o difficulty. Affect pleasant.   ASSESSMENT & PLAN: R breast cancer: Doing great.  She completed Herceptin in 3/15.  Echo today showed no changes, was normal.  No further screening echoes necessary at this point, she can followup as needed.  Larey Dresser 10/17/2013

## 2013-11-14 ENCOUNTER — Telehealth (INDEPENDENT_AMBULATORY_CARE_PROVIDER_SITE_OTHER): Payer: Self-pay

## 2013-11-14 NOTE — Telephone Encounter (Signed)
Message copied by Carlene Coria on Wed Nov 14, 2013 12:46 PM ------      Message from: Joya San      Created: Wed Nov 14, 2013  8:31 AM      Contact: [phone]       Pt finished treatment on  03/19 her question is does she go to her plastic surgeon to get port removed or come here? ------

## 2013-11-14 NOTE — Telephone Encounter (Signed)
LMOM> pt will need appt here with Dr Brantley Stage to discuss and schedule port removal. Oncology MD will need to send [place] a msg stating it is okay to have port removed also.

## 2013-11-20 ENCOUNTER — Telehealth: Payer: Self-pay | Admitting: *Deleted

## 2013-11-20 NOTE — Telephone Encounter (Signed)
Left vm for pt to return call to r/s f/u appt with Dr. Magrinat. 

## 2013-11-20 NOTE — Telephone Encounter (Signed)
Confirmed new appt and date for 12/25/13. Lab 1015 and appt with Mendel Ryder at 1045. Pt denies needs at this time.

## 2013-12-06 ENCOUNTER — Encounter (HOSPITAL_BASED_OUTPATIENT_CLINIC_OR_DEPARTMENT_OTHER): Payer: Self-pay | Admitting: *Deleted

## 2013-12-06 NOTE — Progress Notes (Signed)
No labs needed

## 2013-12-12 ENCOUNTER — Ambulatory Visit (HOSPITAL_BASED_OUTPATIENT_CLINIC_OR_DEPARTMENT_OTHER)
Admission: RE | Admit: 2013-12-12 | Discharge: 2013-12-12 | Disposition: A | Discharge status: Home / Self Care | Payer: BC Managed Care – PPO | Source: Ambulatory Visit | Attending: Plastic Surgery | Admitting: Plastic Surgery

## 2013-12-12 ENCOUNTER — Encounter (HOSPITAL_BASED_OUTPATIENT_CLINIC_OR_DEPARTMENT_OTHER): Payer: BC Managed Care – PPO | Admitting: Anesthesiology

## 2013-12-12 ENCOUNTER — Encounter (HOSPITAL_BASED_OUTPATIENT_CLINIC_OR_DEPARTMENT_OTHER): Payer: Self-pay | Admitting: Anesthesiology

## 2013-12-12 ENCOUNTER — Ambulatory Visit (HOSPITAL_BASED_OUTPATIENT_CLINIC_OR_DEPARTMENT_OTHER): Payer: BC Managed Care – PPO | Admitting: Anesthesiology

## 2013-12-12 ENCOUNTER — Encounter (HOSPITAL_BASED_OUTPATIENT_CLINIC_OR_DEPARTMENT_OTHER)
Admission: RE | Disposition: A | Discharge status: Home / Self Care | Payer: Self-pay | Source: Ambulatory Visit | Attending: Plastic Surgery

## 2013-12-12 DIAGNOSIS — Z9013 Acquired absence of bilateral breasts and nipples: Secondary | ICD-10-CM

## 2013-12-12 DIAGNOSIS — Z4430 Encounter for fitting and adjustment of external breast prosthesis, unspecified breast: Secondary | ICD-10-CM | POA: Insufficient documentation

## 2013-12-12 DIAGNOSIS — Z923 Personal history of irradiation: Secondary | ICD-10-CM | POA: Insufficient documentation

## 2013-12-12 DIAGNOSIS — Z9221 Personal history of antineoplastic chemotherapy: Secondary | ICD-10-CM | POA: Insufficient documentation

## 2013-12-12 DIAGNOSIS — I10 Essential (primary) hypertension: Secondary | ICD-10-CM | POA: Insufficient documentation

## 2013-12-12 DIAGNOSIS — Z9010 Acquired absence of unspecified breast and nipple: Secondary | ICD-10-CM | POA: Insufficient documentation

## 2013-12-12 DIAGNOSIS — Z853 Personal history of malignant neoplasm of breast: Secondary | ICD-10-CM | POA: Insufficient documentation

## 2013-12-12 HISTORY — PX: LIPOSUCTION WITH LIPOFILLING: SHX6436

## 2013-12-12 HISTORY — PX: PORT-A-CATH REMOVAL: SHX5289

## 2013-12-12 HISTORY — PX: REMOVAL OF BILATERAL TISSUE EXPANDERS WITH PLACEMENT OF BILATERAL BREAST IMPLANTS: SHX6431

## 2013-12-12 LAB — POCT HEMOGLOBIN-HEMACUE: HEMOGLOBIN: 14.2 g/dL (ref 12.0–15.0)

## 2013-12-12 SURGERY — REMOVAL, TISSUE EXPANDER, BREAST, BILATERAL, WITH BILATERAL IMPLANT IMPLANT INSERTION
Anesthesia: General | Site: Chest | Laterality: Left

## 2013-12-12 MED ORDER — MIDAZOLAM HCL 2 MG/2ML IJ SOLN: 1.0000 mg | INTRAMUSCULAR | Status: DC | PRN

## 2013-12-12 MED ORDER — SUCCINYLCHOLINE CHLORIDE 20 MG/ML IJ SOLN
INTRAMUSCULAR | Status: DC | PRN
  Administered 2013-12-12: 50 mg via INTRAVENOUS

## 2013-12-12 MED ORDER — HYDROMORPHONE HCL PF 1 MG/ML IJ SOLN
0.2500 mg | INTRAMUSCULAR | Status: DC | PRN
  Administered 2013-12-12 (×2): 0.5 mg via INTRAVENOUS

## 2013-12-12 MED ORDER — SUCCINYLCHOLINE CHLORIDE 20 MG/ML IJ SOLN
INTRAMUSCULAR | Status: AC
  Filled 2013-12-12: qty 3

## 2013-12-12 MED ORDER — ONDANSETRON HCL 4 MG/2ML IJ SOLN: 4.0000 mg | Freq: Once | INTRAMUSCULAR | Status: DC | PRN

## 2013-12-12 MED ORDER — BUPIVACAINE-EPINEPHRINE (PF) 0.25% -1:200000 IJ SOLN
INTRAMUSCULAR | Status: AC
  Filled 2013-12-12: qty 30

## 2013-12-12 MED ORDER — HYDROMORPHONE HCL PF 1 MG/ML IJ SOLN
INTRAMUSCULAR | Status: AC
  Filled 2013-12-12: qty 1

## 2013-12-12 MED ORDER — PROPOFOL 10 MG/ML IV BOLUS
INTRAVENOUS | Status: DC | PRN
  Administered 2013-12-12: 200 mg via INTRAVENOUS

## 2013-12-12 MED ORDER — PROPOFOL 10 MG/ML IV BOLUS
INTRAVENOUS | Status: AC
  Filled 2013-12-12: qty 100

## 2013-12-12 MED ORDER — LIDOCAINE HCL (PF) 1 % IJ SOLN
INTRAMUSCULAR | Status: AC
  Filled 2013-12-12: qty 60

## 2013-12-12 MED ORDER — LIDOCAINE HCL 1 % IJ SOLN
INTRAMUSCULAR | Status: DC | PRN
  Administered 2013-12-12: 12:00:00

## 2013-12-12 MED ORDER — LIDOCAINE-EPINEPHRINE 1 %-1:100000 IJ SOLN
INTRAMUSCULAR | Status: AC
  Filled 2013-12-12: qty 1

## 2013-12-12 MED ORDER — FENTANYL CITRATE 0.05 MG/ML IJ SOLN
INTRAMUSCULAR | Status: DC | PRN
  Administered 2013-12-12 (×8): 50 ug via INTRAVENOUS

## 2013-12-12 MED ORDER — ONDANSETRON HCL 4 MG/2ML IJ SOLN
INTRAMUSCULAR | Status: DC | PRN
  Administered 2013-12-12: 4 mg via INTRAVENOUS

## 2013-12-12 MED ORDER — FENTANYL CITRATE 0.05 MG/ML IJ SOLN
INTRAMUSCULAR | Status: AC
  Filled 2013-12-12: qty 8

## 2013-12-12 MED ORDER — DEXAMETHASONE SODIUM PHOSPHATE 4 MG/ML IJ SOLN
INTRAMUSCULAR | Status: DC | PRN
  Administered 2013-12-12: 10 mg via INTRAVENOUS

## 2013-12-12 MED ORDER — LACTATED RINGERS IV SOLN
INTRAVENOUS | Status: DC
  Administered 2013-12-12 (×2): via INTRAVENOUS

## 2013-12-12 MED ORDER — PROPOFOL 10 MG/ML IV BOLUS: INTRAVENOUS | Status: DC | PRN

## 2013-12-12 MED ORDER — BUPIVACAINE-EPINEPHRINE 0.25% -1:200000 IJ SOLN
INTRAMUSCULAR | Status: DC | PRN
  Administered 2013-12-12: 9 mL

## 2013-12-12 MED ORDER — FENTANYL CITRATE 0.05 MG/ML IJ SOLN: 50.0000 ug | INTRAMUSCULAR | Status: DC | PRN

## 2013-12-12 MED ORDER — LIDOCAINE HCL (CARDIAC) 20 MG/ML IV SOLN
INTRAVENOUS | Status: DC | PRN
  Administered 2013-12-12: 80 mg via INTRAVENOUS

## 2013-12-12 MED ORDER — OXYCODONE HCL 5 MG PO TABS
ORAL_TABLET | ORAL | Status: AC
  Filled 2013-12-12: qty 1

## 2013-12-12 MED ORDER — MIDAZOLAM HCL 2 MG/2ML IJ SOLN
INTRAMUSCULAR | Status: AC
  Filled 2013-12-12: qty 2

## 2013-12-12 MED ORDER — DEXTROSE 5 % IV SOLN
2.0000 g | INTRAVENOUS | Status: DC | PRN
  Administered 2013-12-12: 2 g via INTRAVENOUS

## 2013-12-12 MED ORDER — SODIUM CHLORIDE 0.9 % IR SOLN
Status: DC | PRN
  Administered 2013-12-12: 12:00:00

## 2013-12-12 MED ORDER — EPINEPHRINE HCL 1 MG/ML IJ SOLN
INTRAMUSCULAR | Status: AC
  Filled 2013-12-12: qty 1

## 2013-12-12 MED ORDER — MIDAZOLAM HCL 5 MG/5ML IJ SOLN
INTRAMUSCULAR | Status: DC | PRN
  Administered 2013-12-12: 2 mg via INTRAVENOUS

## 2013-12-12 MED ORDER — OXYCODONE HCL 5 MG/5ML PO SOLN: 5.0000 mg | Freq: Once | ORAL | Status: AC | PRN

## 2013-12-12 MED ORDER — OXYCODONE HCL 5 MG PO TABS
5.0000 mg | ORAL_TABLET | Freq: Once | ORAL | Status: AC | PRN
  Administered 2013-12-12: 5 mg via ORAL

## 2013-12-12 SURGICAL SUPPLY — 91 items
BAG DECANTER FOR FLEXI CONT (MISCELLANEOUS) ×3 IMPLANT
BINDER ABDOMINAL  9 SM 30-45 (SOFTGOODS) ×1
BINDER ABDOMINAL 9 SM 30-45 (SOFTGOODS) ×2 IMPLANT
BINDER BREAST LRG (GAUZE/BANDAGES/DRESSINGS) IMPLANT
BINDER BREAST MEDIUM (GAUZE/BANDAGES/DRESSINGS) ×3 IMPLANT
BINDER BREAST XLRG (GAUZE/BANDAGES/DRESSINGS) IMPLANT
BINDER BREAST XXLRG (GAUZE/BANDAGES/DRESSINGS) IMPLANT
BIOPATCH RED 1 DISK 7.0 (GAUZE/BANDAGES/DRESSINGS) IMPLANT
BLADE HEX COATED 2.75 (ELECTRODE) ×3 IMPLANT
BLADE SURG 15 STRL LF DISP TIS (BLADE) ×2 IMPLANT
BLADE SURG 15 STRL SS (BLADE) ×1
BLADE SURG ROTATE 9660 (MISCELLANEOUS) IMPLANT
BNDG GAUZE ELAST 4 BULKY (GAUZE/BANDAGES/DRESSINGS) ×6 IMPLANT
CANISTER LIPO FAT HARVEST (MISCELLANEOUS) ×3 IMPLANT
CANISTER SUCT 1200ML W/VALVE (MISCELLANEOUS) ×3 IMPLANT
CANNULA ASPIRATION (CANNULA) ×3 IMPLANT
CHLORAPREP W/TINT 26ML (MISCELLANEOUS) ×3 IMPLANT
CORDS BIPOLAR (ELECTRODE) IMPLANT
COVER MAYO STAND STRL (DRAPES) ×3 IMPLANT
COVER TABLE BACK 60X90 (DRAPES) ×3 IMPLANT
DECANTER SPIKE VIAL GLASS SM (MISCELLANEOUS) IMPLANT
DERMABOND ADVANCED (GAUZE/BANDAGES/DRESSINGS) ×3
DERMABOND ADVANCED .7 DNX12 (GAUZE/BANDAGES/DRESSINGS) ×6 IMPLANT
DRAIN CHANNEL 19F RND (DRAIN) IMPLANT
DRAPE LAPAROSCOPIC ABDOMINAL (DRAPES) ×3 IMPLANT
DRAPE PED LAPAROTOMY (DRAPES) ×3 IMPLANT
DRSG PAD ABDOMINAL 8X10 ST (GAUZE/BANDAGES/DRESSINGS) ×12 IMPLANT
DRSG TEGADERM 2-3/8X2-3/4 SM (GAUZE/BANDAGES/DRESSINGS) IMPLANT
ELECT BLADE 4.0 EZ CLEAN MEGAD (MISCELLANEOUS) ×3
ELECT COATED BLADE 2.86 ST (ELECTRODE) IMPLANT
ELECT REM PT RETURN 9FT ADLT (ELECTROSURGICAL) ×3
ELECTRODE BLDE 4.0 EZ CLN MEGD (MISCELLANEOUS) ×2 IMPLANT
ELECTRODE REM PT RTRN 9FT ADLT (ELECTROSURGICAL) ×2 IMPLANT
EVACUATOR SILICONE 100CC (DRAIN) IMPLANT
FILTER LIPOSUCTION (MISCELLANEOUS) ×3 IMPLANT
GLOVE BIO SURGEON STRL SZ 6.5 (GLOVE) IMPLANT
GLOVE SURG SS PI 6.5 STRL IVOR (GLOVE) ×3 IMPLANT
GLOVE SURG SS PI 7.0 STRL IVOR (GLOVE) ×3 IMPLANT
GOWN STRL REUS W/ TWL LRG LVL3 (GOWN DISPOSABLE) ×4 IMPLANT
GOWN STRL REUS W/TWL LRG LVL3 (GOWN DISPOSABLE) ×2
IMPL GEL HI PROFILE 325CC (Breast) ×2 IMPLANT
IMPL GEL HI PROFILE 375CC (Breast) ×2 IMPLANT
IMPLANT GEL HI PROFILE 325CC (Breast) ×3 IMPLANT
IMPLANT GEL HI PROFILE 375CC (Breast) ×3 IMPLANT
IV NS 1000ML (IV SOLUTION)
IV NS 1000ML BAXH (IV SOLUTION) IMPLANT
IV NS 500ML (IV SOLUTION)
IV NS 500ML BAXH (IV SOLUTION) IMPLANT
KIT FILL SYSTEM UNIVERSAL (SET/KITS/TRAYS/PACK) ×3 IMPLANT
LINER CANISTER 1000CC FLEX (MISCELLANEOUS) ×3 IMPLANT
NDL SAFETY ECLIPSE 18X1.5 (NEEDLE) ×4 IMPLANT
NEEDLE 27GAX1X1/2 (NEEDLE) ×3 IMPLANT
NEEDLE HYPO 18GX1.5 SHARP (NEEDLE) ×2
NEEDLE HYPO 25X1 1.5 SAFETY (NEEDLE) ×3 IMPLANT
NEEDLE SPNL 18GX3.5 QUINCKE PK (NEEDLE) ×3 IMPLANT
NS IRRIG 1000ML POUR BTL (IV SOLUTION) IMPLANT
PACK BASIN DAY SURGERY FS (CUSTOM PROCEDURE TRAY) ×3 IMPLANT
PAD ALCOHOL SWAB (MISCELLANEOUS) ×3 IMPLANT
PENCIL BUTTON HOLSTER BLD 10FT (ELECTRODE) ×3 IMPLANT
PIN SAFETY STERILE (MISCELLANEOUS) IMPLANT
SIZER BREAST GEL REUSE 325CC (SIZER) ×3
SIZER BREAST REUSE 375CC (SIZER) ×1
SIZER BREAST REUSE 400CC (SIZER) ×1
SIZER BRST GEL REUSE 325CC (SIZER) ×2 IMPLANT
SIZER BRST REUSE 400CC (SIZER) ×2 IMPLANT
SIZER BRST REUSE P4.8 12 375CC (SIZER) ×2 IMPLANT
SIZER GENERIC MENTOR (SIZER) ×9 IMPLANT
SLEEVE SCD COMPRESS KNEE MED (MISCELLANEOUS) ×3 IMPLANT
SPONGE GAUZE 2X2 8PLY STRL LF (GAUZE/BANDAGES/DRESSINGS) IMPLANT
SPONGE GAUZE 4X4 12PLY STER LF (GAUZE/BANDAGES/DRESSINGS) IMPLANT
SPONGE LAP 18X18 X RAY DECT (DISPOSABLE) ×6 IMPLANT
STRIP CLOSURE SKIN 1/2X4 (GAUZE/BANDAGES/DRESSINGS) ×3 IMPLANT
SUT MNCRL AB 4-0 PS2 18 (SUTURE) ×9 IMPLANT
SUT MON AB 3-0 SH 27 (SUTURE) ×2
SUT MON AB 3-0 SH27 (SUTURE) ×4 IMPLANT
SUT MON AB 5-0 PS2 18 (SUTURE) ×12 IMPLANT
SUT PDS AB 2-0 CT2 27 (SUTURE) IMPLANT
SUT VIC AB 3-0 SH 27 (SUTURE) ×1
SUT VIC AB 3-0 SH 27X BRD (SUTURE) ×2 IMPLANT
SUT VICRYL 4-0 PS2 18IN ABS (SUTURE) ×3 IMPLANT
SYR BULB IRRIGATION 50ML (SYRINGE) ×3 IMPLANT
SYR CONTROL 10ML LL (SYRINGE) ×3 IMPLANT
SYR TB 1ML LL NO SAFETY (SYRINGE) ×3 IMPLANT
SYRINGE 20CC LL (MISCELLANEOUS) IMPLANT
SYRINGE 60CC LL (MISCELLANEOUS) ×6 IMPLANT
TOWEL OR 17X24 6PK STRL BLUE (TOWEL DISPOSABLE) ×6 IMPLANT
TRAY DSU PREP LF (CUSTOM PROCEDURE TRAY) ×3 IMPLANT
TUBE CONNECTING 20X1/4 (TUBING) ×3 IMPLANT
TUBING SET GRADUATE ASPIR 12FT (MISCELLANEOUS) ×3 IMPLANT
UNDERPAD 30X30 INCONTINENT (UNDERPADS AND DIAPERS) ×6 IMPLANT
YANKAUER SUCT BULB TIP NO VENT (SUCTIONS) ×3 IMPLANT

## 2013-12-12 NOTE — Discharge Instructions (Signed)
May shower tomorrow Continue to wear sports bra and binder or spanx  Post Anesthesia Home Care Instructions  Activity: Get plenty of rest for the remainder of the day. A responsible adult should stay with you for 24 hours following the procedure.  For the next 24 hours, DO NOT: -Drive a car -Paediatric nurse -Drink alcoholic beverages -Take any medication unless instructed by your physician -Make any legal decisions or sign important papers.  Meals: Start with liquid foods such as gelatin or soup. Progress to regular foods as tolerated. Avoid greasy, spicy, heavy foods. If nausea and/or vomiting occur, drink only clear liquids until the nausea and/or vomiting subsides. Call your physician if vomiting continues.  Special Instructions/Symptoms: Your throat may feel dry or sore from the anesthesia or the breathing tube placed in your throat during surgery. If this causes discomfort, gargle with warm salt water. The discomfort should disappear within 24 hours.

## 2013-12-12 NOTE — Anesthesia Postprocedure Evaluation (Signed)
  Anesthesia Post-op Note  Patient: Sherry Barry  Procedure(s) Performed: Procedure(s): REMOVAL OF BILATERAL TISSUE EXPANDERS WITH PLACEMENT OF BILATERAL BREAST IMPLANTS/BILATERAL CAPSULECTOMIES WITH  LIPOFILLING FAT GRAFTING (Bilateral) LIPOSUCTION WITH LIPOFILLING (Bilateral) REMOVAL PORT-A-CATH (Left)  Patient Location: PACU  Anesthesia Type:General  Level of Consciousness: awake, alert  and oriented  Airway and Oxygen Therapy: Patient Spontanous Breathing  Post-op Pain: mild  Post-op Assessment: Post-op Vital signs reviewed  Post-op Vital Signs: Reviewed  Last Vitals:  Filed Vitals:   12/12/13 1446  BP: 162/98  Pulse: 76  Temp: 36.6 C  Resp: 16    Complications: No apparent anesthesia complications

## 2013-12-12 NOTE — Transfer of Care (Signed)
Immediate Anesthesia Transfer of Care Note  Patient: Sherry Barry  Procedure(s) Performed: Procedure(s): REMOVAL OF BILATERAL TISSUE EXPANDERS WITH PLACEMENT OF BILATERAL BREAST IMPLANTS/BILATERAL CAPSULECTOMIES WITH  LIPOFILLING FAT GRAFTING (Bilateral) LIPOSUCTION WITH LIPOFILLING (Bilateral) REMOVAL PORT-A-CATH (Left)  Patient Location: PACU  Anesthesia Type:General  Level of Consciousness: awake, alert  and oriented  Airway & Oxygen Therapy: Patient Spontanous Breathing and Patient connected to face mask oxygen  Post-op Assessment: Report given to PACU RN, Post -op Vital signs reviewed and stable and Patient moving all extremities  Post vital signs: Reviewed and stable  Complications: No apparent anesthesia complications

## 2013-12-12 NOTE — Op Note (Signed)
Op report Bilateral Exchange   DATE OF OPERATION: 12/12/2013  LOCATION: [facility]  SURGICAL DIVISION: Plastic Surgery  PREOPERATIVE DIAGNOSES:  1. History of breast cancer.  2. Acquired absence of bilateral breast.   POSTOPERATIVE DIAGNOSES:  1. History of breast cancer.  2. Acquired absence of bilateral breast.   PROCEDURE:  1. Bilateral exchange of tissue expanders for implants.  2. Bilateral capsulotomies for implant respositioning. 3. Removal of the left chest port-a-cath. 4. Lipofilling of the left chest breast area superiorly.  SURGEON: Theodoro Kos, DO  ANESTHESIA:  General.   COMPLICATIONS: None.   IMPLANTS: Right - [employer] Smooth Round High Profile Gel 325cc. Ref #[id number].  Serial Number [id number] Left - [employer] Smooth Round High Profile Gel 375cc. Ref #[id number].  Serial Number [id number]  INDICATIONS FOR PROCEDURE:  The patient, Sherry Barry, is a 43 y.o. female born on 05/17/1971, is here for treatment after bilateral mastectomies.  She had tissue expanders placed at the time of mastectomies. She now presents for exchange of her expanders for implants.  She requires capsulotomies to better position the implants. MRN: [id number]  CONSENT:  Informed consent was obtained directly from the patient. Risks, benefits and alternatives were fully discussed. Specific risks including but not limited to bleeding, infection, hematoma, seroma, scarring, pain, implant infection, implant extrusion, capsular contracture, asymmetry, wound healing problems, and need for further surgery were all discussed. The patient did have an ample opportunity to have her questions answered to her satisfaction.   DESCRIPTION OF PROCEDURE:  The patient was taken to the operating room. SCDs were placed and IV antibiotics were given. The patient's chest was prepped and draped in a sterile fashion. A time out was performed and the implants to be used were  identified.  One percent Lidocaine with epinephrine was used to infiltrate the area.  The tumescent was infused in the lateral right breast area and abdomen.  The old mastectomy scar was opened and superior mastectomy and inferior mastectomy flaps were re-raised over the pectoralis major muscle. The pectoralis was split to expose the tissue expander which was removed. Inspection of the pocket showed a normal healthy capsule and good integration of the biologic matrix on the left and healthy muscle on the right.  The liposuction was done on the abdomen and prepared for lipofilling.  The tubing was connected directly to the empty cannister to remove the fat on the lateral right side which was not included in the lipofilling syringe.   Bilateral capsulotomies were performed on each breast to allow for breast pocket expansion on either side.  Measurements were made on either side to confirm adequate pocket size for the implant dimensions.  Hemostasis was ensured.  On either side, the pocket was irrigated with antibiotic solution and gloves were changed. The implants were placed in the pockets and oriented appropriately. On the left, the pectoralis major muscle and capsule on the anterior surface were re-closed with a 3-0 running Monocryl. On the right, the latissimus was re-approximated to the fold and closed with 3-0 Monocryl.  The remaining skin was closed with 4-0 Monocryl deep dermal.  The fat was then placed in the left superior medial breast area to improve the contour. The skin was closed with 5-0 Monocryl subcuticular stitches.   The port was removed with a #15 blade to open the skin.  The catheter was cinched with a 3-0 Monocryl and removed to prevent an air embolis.  The deep layers were closed with 5-0 Monocryl. A  breast binder and ABDs were placed.  The patient was allowed to wake from anesthesia and taken to the recovery room in satisfactory condition.

## 2013-12-12 NOTE — H&P (Signed)
Sherry Barry is an 43 y.o. female.   Chief Complaint: breast reconstruction HPI: The patient is a 43 yrs old female here for a history and physical for continued bilateral breast reconstruction for treatment of right breast cancer with a right latissimus/expander and left expander/ADM. 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 A repeat mammogram in 2013 showed a decrease in size of the benign fibroadenoma in the left breast compared to 2010. However, there were pleomorphic calcifications and thickening in the right breast. An ultrasound of the right breast (1/14) showed multiple hypoechoic masses. 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 Bilateral breast MRI (1/14) showed enhancing masses. Genetics were negative. She patient underwent bilateral mastectomies (by Dr. Brantley Stage) on 06/2012. The left mastectomy was negative. The right mastectomy revealed 2 tumors: #1) invasive ductal carcinoma, grade 3, and DCIS, grade 3, with comedo necrosis. Lymphovascular invasion was identified; #2) a ductal carcinoma in situ, grade 3, with comedo necrosis. Surgical margins were negative. One of the four lymph nodes were positive. She finished radiation on the right September 2014. She is 5 feet 2 inches tall, weighs 137 pounds and wore a 34DD bra. She is pleased with her size and has good symmetry.  Past Medical History  Diagnosis Date  . Allergy   . Migraine   . Migraine   . History of migraine     last one about a week ago  . History of chemotherapy     doxetaxel/carboplatin/trastuzumab  . Breast cancer   . Breast cancer     right  . Hx of radiation therapy 01/01/13- 02/15/13    r chest wall, r supraclav/axilla 5040 cGy/28 sessions, scar boost 1000 cGy/5 sessions  . Hypertension     no meds,   urgent  care on [place]ana    Past Surgical History  Procedure Laterality Date  . Simple mastectomy with axillary sentinel node biopsy  07/14/2012    Procedure: SIMPLE MASTECTOMY WITH AXILLARY SENTINEL NODE BIOPSY;  Surgeon: Joyice Faster. Cornett, MD;  Location: [facility];  Service: General;  Laterality: Right;  Bilateral simple mastectomy with port and right sebtibel lymph node mapping  . Simple mastectomy with axillary sentinel node biopsy  07/14/2012    Procedure: SIMPLE MASTECTOMY;  Surgeon: Joyice Faster. Cornett, MD;  Location: [facility];  Service: General;  Laterality: Left;  . Portacath placement  07/14/2012    Procedure: INSERTION PORT-A-CATH;  Surgeon: Joyice Faster. Cornett, MD;  Location: [facility];  Service: General;  Laterality: Left;  . Abdominal hysterectomy      no salpingo-oophorectomy  . Breast reconstruction with placement of tissue expander and flex hd (acellular hydrated dermis) Left 09/24/2013  . Reconstruction breast w/ latissimus dorsi flap Right 09/24/2013    "& tissue expander placement"  . Latissimus flap to breast Right 09/24/2013    Procedure: RIGHT LATISSMUS MYOCUTAEIOUS MUSCLE FLAP AND PLACEMENT OF TISSUE [name];  Surgeon: Theodoro Kos, DO;  Location: [facility];  Service: Plastics;  Laterality: Right;  . Tissue expander placement Left 09/24/2013    Procedure: PLACEMENT OF TISSUE EXPANDER AND FLEX HD TO LEFT BREAST;  Surgeon: Theodoro Kos, DO;  Location: [facility];  Service: Plastics;  Laterality: Left;    Family History  Problem Relation Age of Onset  . Hypertension Mother   . Alcohol abuse  Mother   . Heart disease Maternal Grandmother   . Stroke Maternal Grandfather   . Colon cancer Maternal Aunt 46    alive at 79  . Brain cancer Maternal Uncle 50    and lymphoma in early 69s; deceased  . Brain cancer Maternal Uncle 60    deceased  . Pancreatic cancer Maternal Uncle 45    alive at 38  . Breast cancer Maternal Aunt     great aunt through mat GF; dx at ? age  . Breast cancer Cousin 37    mat  1st cousin once removed through mat GF ; deceased   Social History:  reports that she has never smoked. She has never used smokeless tobacco. She reports that she drinks alcohol. She reports that she does not use illicit drugs.  Allergies:  Allergies  Allergen Reactions  . Codeine Itching  . Latex Swelling    Medications Prior to Admission  Medication Sig Dispense Refill  . diazepam (VALIUM) 2 MG tablet Take 1 tablet (2 mg total) by mouth every 12 (twelve) hours as needed for muscle spasms.  30 tablet  0  . Multiple Vitamins-Minerals (HAIR/SKIN/NAILS PO) Take 1 tablet by mouth daily.      . pimecrolimus (ELIDEL) 1 % cream Apply topically 2 (two) times daily.  60 g  1  . tamoxifen (NOLVADEX) 20 MG tablet Take 20 mg by mouth daily.      [name] triamcinolone (NASACORT) 55 MCG/ACT AERO nasal inhaler Place 2 sprays into the nose daily.       . bisacodyl (DULCOLAX) 10 MG suppository Place 1 suppository (10 mg total) rectally daily as needed for moderate constipation.  12 suppository  0  . diclofenac (VOLTAREN) 75 MG EC tablet Take 1 tablet (75 mg total) by mouth 2 (two) times daily.  30 tablet  0  . diphenhydrAMINE (BENADRYL) 12.5 MG/5ML elixir Take 5 mLs (12.5 mg total) by mouth every 8 (eight) hours.  120 mL  0  . docusate sodium 100 MG CAPS Take 100 mg by mouth 3 (three) times daily.  10 capsule  0  . LORazepam (ATIVAN) 0.5 MG tablet Take 0.5-1 tablets (0.25-0.5 mg total) by mouth 2 (two) times daily as needed for anxiety (Nausea or vomiting).  30 tablet  0  . senna-docusate (SENOKOT-S) 8.6-50 MG per tablet Take 1 tablet by mouth at bedtime as needed for mild constipation.        Results for orders placed during the hospital encounter of 12/12/13 (from the past 48 hour(s))  POCT HEMOGLOBIN-HEMACUE     Status: None   Collection Time    12/12/13  7:34 AM      Result Value Ref Range   Hemoglobin 14.2  12.0 - 15.0 g/dL   No results found.  Review of Systems  Constitutional: Negative.   HENT:  Negative.   Eyes: Negative.   Respiratory: Negative.   Cardiovascular: Negative.   Gastrointestinal: Negative.   Genitourinary: Negative.   Musculoskeletal: Negative.   Skin: Negative.   Neurological: Negative.   Psychiatric/Behavioral: Negative.     Blood pressure 131/91, pulse 91, temperature 98.2 F (36.8 C), temperature source Oral, resp. rate 20, height $RemoveBe'5\' 2"'VdZuNqaId$  (1.575 m), weight 62.596 kg (138 lb), SpO2 99.00%. Physical Exam  Constitutional: She is oriented to person, place, and time. She appears well-developed and well-nourished.  HENT:  Head: Normocephalic and atraumatic.  Eyes: Conjunctivae and EOM are normal. Pupils are equal, round, and reactive to light.  Cardiovascular: Normal  rate.   Respiratory: Effort normal.  Musculoskeletal: Normal range of motion.  Neurological: She is alert and oriented to person, place, and time.  Skin: Skin is warm.  Psychiatric: She has a normal mood and affect. Her behavior is normal. Judgment and thought content normal.     Assessment/Plan We placed injectable saline in the Right: 60 cc for a total of 330/350 cc Left: 50 cc for a total of 380/350 cc It was done under sterile conditions and the patient tolerated it well.  Plan for exchange of the expanders for implants.  SANGER,CLAIRE 12/12/2013, 8:18 AM

## 2013-12-12 NOTE — Anesthesia Preprocedure Evaluation (Signed)
Anesthesia Evaluation  Patient identified by MRN, date of birth, ID band Patient awake    Airway Mallampati: I TM Distance: >3 FB Neck ROM: Full    Dental  (+) Teeth Intact, Dental Advisory Given   Pulmonary  breath sounds clear to auscultation        Cardiovascular hypertension, Rhythm:Regular Rate:Normal     Neuro/Psych    GI/Hepatic   Endo/Other    Renal/GU      Musculoskeletal   Abdominal   Peds  Hematology   Anesthesia Other Findings   Reproductive/Obstetrics                           Anesthesia Physical Anesthesia Plan  ASA: II  Anesthesia Plan: General   Post-op Pain Management:    Induction: Intravenous  Airway Management Planned: LMA  Additional Equipment:   Intra-op Plan:   Post-operative Plan: Extubation in OR  Informed Consent: I have reviewed the patients History and Physical, chart, labs and discussed the procedure including the risks, benefits and alternatives for the proposed anesthesia with the patient or authorized representative who has indicated his/her understanding and acceptance.   Dental advisory given  Plan Discussed with: CRNA, Anesthesiologist and Surgeon  Anesthesia Plan Comments:         Anesthesia Quick Evaluation

## 2013-12-12 NOTE — Brief Op Note (Signed)
12/12/2013  12:55 PM  PATIENT:  Sherry Barry  43 y.o. female  PRE-OPERATIVE DIAGNOSIS:  aquired absence of bilateral breast and nipples and history of breast cancer  POST-OPERATIVE DIAGNOSIS:  aquired absence of bilateral breast and nipples and history of breast cancer  PROCEDURE:  Procedure(s): REMOVAL OF BILATERAL TISSUE EXPANDERS WITH PLACEMENT OF BILATERAL BREAST IMPLANTS/BILATERAL CAPSULECTOMIES WITH  LIPOFILLING FAT GRAFTING (Bilateral) LIPOSUCTION WITH LIPOFILLING (Bilateral) REMOVAL PORT-A-CATH (Left)  SURGEON:  Surgeon(s) and Role:    * [name], DO - Primary  ASSISTANTS: none   ANESTHESIA:   general  EBL:  Total I/O In: 1800 [I.V.:1800] Out: -   BLOOD ADMINISTERED:none  DRAINS: none   LOCAL MEDICATIONS USED:  MARCAINE    and LIDOCAINE   SPECIMEN:  No Specimen  DISPOSITION OF SPECIMEN:  N/A  COUNTS:  YES  TOURNIQUET:  * No tourniquets in log *  DICTATION: .Dragon Dictation  PLAN OF CARE: Discharge to home after PACU  PATIENT DISPOSITION:  PACU - hemodynamically stable.   Delay start of Pharmacological VTE agent (>24hrs) due to surgical blood loss or risk of bleeding: no

## 2013-12-12 NOTE — Anesthesia Procedure Notes (Signed)
Procedure Name: Intubation Date/Time: 12/12/2013 10:46 AM Performed by: Marrianne Mood Pre-anesthesia Checklist: Patient identified, Emergency Drugs available, Suction available, Patient being monitored and Timeout performed Patient Re-evaluated:Patient Re-evaluated prior to inductionOxygen Delivery Method: Circle System Utilized Preoxygenation: Pre-oxygenation with 100% oxygen Intubation Type: IV induction Ventilation: Mask ventilation without difficulty Grade View: Grade II Tube type: Oral Tube size: 7.0 mm Number of attempts: 1 Airway Equipment and Method: stylet and oral airway Placement Confirmation: ETT inserted through vocal cords under direct vision,  positive ETCO2 and breath sounds checked- equal and bilateral Tube secured with: Tape Dental Injury: Teeth and Oropharynx as per pre-operative assessment

## 2013-12-13 ENCOUNTER — Encounter (HOSPITAL_BASED_OUTPATIENT_CLINIC_OR_DEPARTMENT_OTHER): Payer: Self-pay | Admitting: Plastic Surgery

## 2013-12-18 ENCOUNTER — Other Ambulatory Visit: Payer: BC Managed Care – PPO

## 2013-12-20 DIAGNOSIS — Z9889 Other specified postprocedural states: Secondary | ICD-10-CM | POA: Insufficient documentation

## 2013-12-25 ENCOUNTER — Encounter: Payer: Self-pay | Admitting: Adult Health

## 2013-12-25 ENCOUNTER — Other Ambulatory Visit (HOSPITAL_BASED_OUTPATIENT_CLINIC_OR_DEPARTMENT_OTHER): Payer: BC Managed Care – PPO

## 2013-12-25 ENCOUNTER — Ambulatory Visit (HOSPITAL_BASED_OUTPATIENT_CLINIC_OR_DEPARTMENT_OTHER): Payer: BC Managed Care – PPO | Admitting: Adult Health

## 2013-12-25 ENCOUNTER — Ambulatory Visit: Payer: BC Managed Care – PPO | Admitting: Oncology

## 2013-12-25 ENCOUNTER — Telehealth: Payer: Self-pay | Admitting: Adult Health

## 2013-12-25 VITALS — BP 128/90 | HR 95 | Temp 98.2°F | Resp 18 | Ht 62.0 in | Wt 138.0 lb

## 2013-12-25 DIAGNOSIS — F411 Generalized anxiety disorder: Secondary | ICD-10-CM

## 2013-12-25 DIAGNOSIS — C50219 Malignant neoplasm of upper-inner quadrant of unspecified female breast: Secondary | ICD-10-CM

## 2013-12-25 DIAGNOSIS — C50211 Malignant neoplasm of upper-inner quadrant of right female breast: Secondary | ICD-10-CM

## 2013-12-25 DIAGNOSIS — Z170 Estrogen receptor positive status [ER+]: Secondary | ICD-10-CM

## 2013-12-25 LAB — CBC WITH DIFFERENTIAL/PLATELET
BASO%: 0.3 % (ref 0.0–2.0)
Basophils Absolute: 0 10*3/uL (ref 0.0–0.1)
EOS%: 1.6 % (ref 0.0–7.0)
Eosinophils Absolute: 0.1 10*3/uL (ref 0.0–0.5)
HCT: 41.9 % (ref 34.8–46.6)
HGB: 14.2 g/dL (ref 11.6–15.9)
LYMPH%: 25.8 % (ref 14.0–49.7)
MCH: 29.2 pg (ref 25.1–34.0)
MCHC: 33.9 g/dL (ref 31.5–36.0)
MCV: 86 fL (ref 79.5–101.0)
MONO#: 0.3 10*3/uL (ref 0.1–0.9)
MONO%: 4.5 % (ref 0.0–14.0)
NEUT#: 4.2 10*3/uL (ref 1.5–6.5)
NEUT%: 67.8 % (ref 38.4–76.8)
PLATELETS: 225 10*3/uL (ref 145–400)
RBC: 4.87 10*6/uL (ref 3.70–5.45)
RDW: 12 % (ref 11.2–14.5)
WBC: 6.3 10*3/uL (ref 3.9–10.3)
lymph#: 1.6 10*3/uL (ref 0.9–3.3)

## 2013-12-25 LAB — COMPREHENSIVE METABOLIC PANEL (CC13)
ALT: 11 U/L (ref 0–55)
ANION GAP: 10 meq/L (ref 3–11)
AST: 16 U/L (ref 5–34)
Albumin: 3.7 g/dL (ref 3.5–5.0)
Alkaline Phosphatase: 61 U/L (ref 40–150)
BILIRUBIN TOTAL: 0.33 mg/dL (ref 0.20–1.20)
BUN: 12.1 mg/dL (ref 7.0–26.0)
CO2: 25 meq/L (ref 22–29)
CREATININE: 0.8 mg/dL (ref 0.6–1.1)
Calcium: 9.1 mg/dL (ref 8.4–10.4)
Chloride: 106 mEq/L (ref 98–109)
Glucose: 87 mg/dl (ref 70–140)
Potassium: 3.3 mEq/L — ABNORMAL LOW (ref 3.5–5.1)
Sodium: 141 mEq/L (ref 136–145)
TOTAL PROTEIN: 7.3 g/dL (ref 6.4–8.3)

## 2013-12-25 NOTE — Patient Instructions (Signed)
Replens, coconut oil  You are doing well.  You have no sign of recurrence, continue taking Tamoxifen daily.  I recommend healthy diet, exercise, and monthly breast exams.  Please call us if you have any questions or concerns.    Please follow up with your primary care doctor for a well exam.

## 2013-12-25 NOTE — Telephone Encounter (Signed)
per pof to sch pt appt-gave pt copy of sch °

## 2013-12-25 NOTE — Progress Notes (Signed)
ID: Sherry Barry   DOB: [birth year]  MR#: [id number]  GBT#:[id number]  PCP: Lynne Logan, MD GYN: Everett Graff. MD SU: Erroll Luna. 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 At that time a digital bilateal diagnostic mammogram showed a slight decrease in size of the benign fibroadenoma in the left breast compared to 2010. The same mammogram, however, also refilled pleomorphic calcifications extending over 9 cm in the inner third of the right breast. There were no suspicious calcifications in the left breast. On exam, Dr. Purcell Nails was able to palpate a small freely mobile mass in the lower inner left breast, but was unable to palpate any abnormalities in the right breast other than some focal thickening.  An subsequent ultrasound of the right breast on 06/16/2012 showed multiple hypoechoic masses throughout the upper quadrants of the right breast. The palpable mass in the 1:00 location corresponded with an irregular hypoechoic mass measuring 1.7 x 1.4 cm. There was also a discrete hypoechoic lobulated mass at the 11:00 position measuring 2.1 x 1.1 x 2.1 cm. The third lesion identified at the 11:00 location, 3 cm from the nipple, measured 1.0 x 0.8 x 0.9 cm.   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  Bilateral breast MRI 06/23/2012 showed an enhancing mass in the upper central right breast measuring 1.8 cm, together with clumped nodular enhancement extending for a total area of  11.4 cm. In the upper outer quadrant of this breast there were 3 discrete irregular masses measuring up to 3 cm. This correlated well with the ultrasound findings previously. In the left breast there was a large area of clumped nodular enhancement measuring up to 8.9 cm. In the lower central portion there was a 1.7 cm circumscribed nodule consistent with a known fibroadenoma. There weas no axillary or internal mammary adenopathy noted on either side.   The 8.9 cm area of abnormality in the left breast was biopsied on 06/30/2012 showing fibrocystic changes with adenosis and calcifications, pseudoangiomatous stromal hyperplasia, but no evidence of malignancy.  The patient underwent bilateral mastectomies under the care of Dr. Brantley Stage on 07/14/2012. The left mastectomy revealed benign breast tissue and was negative for malignancy. 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 Lymphovascular invasion was identified; #2) a ductal carcinoma in situ, grade 3, with comedo necrosis.  Surgical margins were negative. 4 lymph nodes were examined, one positive for metastatic mammary carcinoma, a second positive for isolated tumor cells. pT2, pN1a  Subsequent history is detailed below.  INTERVAL HISTORY: Sherry Barry returns alone today for evaluation of her h/o right breast cancer.  She is taking Tamoxifen daily.  She has had a recent yeast infection that she had difficulty with it stopping.  She also endorses vaginal dryness.  She isn't sure how treat her dryness.  She did f/u with gynecology for her yeast infection.  She occasionally eats yogurt  due to the fact that she is lactose intolerant.  She underwent reconstruction with latissumus flap and expander placement on 09/24/2013, and her implants were placed on 12/12/2013.  She tolerated both surgeries well and had no complications.  She also saw Dr. Aundra Dubin in the [facility] on 10/16/2013 and her echocardiogram was stable since the completion of her Trastuzumab therapy.   She denies joint aches, any swelling, chest pain, shortness of breath, vaginal bleeding, or any further questions or concerns.  We updated her health maintenance below.     REVIEW OF SYSTEMS: A 10 point review of systems was conducted and is otherwise negative except for what is noted above.     PAST MEDICAL HISTORY: Past Medical History  Diagnosis Date  . Allergy   . Migraine   . Migraine   . History of migraine     last one about a week ago  . History of chemotherapy     doxetaxel/carboplatin/trastuzumab  . Breast cancer   . Breast cancer     right  . Hx of radiation therapy 01/01/13- 02/15/13    r chest wall, r supraclav/axilla 5040 cGy/28 sessions, scar boost 1000 cGy/5 sessions  . Hypertension     no meds,   urgent care on pomana    PAST SURGICAL HISTORY: Past Surgical History  Procedure Laterality Date  . Simple mastectomy with axillary sentinel node biopsy  07/14/2012    Procedure: SIMPLE MASTECTOMY WITH AXILLARY SENTINEL NODE BIOPSY;  Surgeon: Joyice Faster. Cornett, MD;  Location: [facility];  Service: General;  Laterality: Right;  Bilateral simple mastectomy with port and right sebtibel lymph node mapping  . Simple mastectomy with axillary sentinel node biopsy  07/14/2012    Procedure: SIMPLE MASTECTOMY;  Surgeon: Joyice Faster. Cornett, MD;  Location: [facility];  Service: General;  Laterality: Left;  . Portacath placement  07/14/2012    Procedure: INSERTION PORT-A-CATH;  Surgeon: Joyice Faster. Cornett, MD;  Location: [facility];  Service: General;  Laterality: Left;  . Abdominal hysterectomy      no salpingo-oophorectomy  . Breast reconstruction with placement of tissue expander and flex hd (acellular hydrated dermis) Left 09/24/2013  . Reconstruction breast w/ latissimus dorsi flap Right 09/24/2013    "& tissue expander placement"  . Latissimus flap to breast Right 09/24/2013    Procedure: RIGHT  LATISSMUS MYOCUTAEIOUS MUSCLE FLAP AND PLACEMENT OF TISSUE [name];  Surgeon: Theodoro Kos, DO;  Location: [facility];  Service: Plastics;  Laterality: Right;  . Tissue expander placement Left 09/24/2013    Procedure: PLACEMENT OF TISSUE EXPANDER AND FLEX HD TO LEFT BREAST;  Surgeon: Theodoro Kos, DO;  Location: [facility];  Service: Plastics;  Laterality: Left;  . Removal of bilateral tissue expanders with placement of bilateral breast implants Bilateral 12/12/2013    Procedure: REMOVAL OF BILATERAL TISSUE EXPANDERS WITH PLACEMENT OF BILATERAL BREAST IMPLANTS/BILATERAL CAPSULECTOMIES WITH  LIPOFILLING FAT GRAFTING;  Surgeon: Theodoro Kos, DO;  Location: [facility];  Service: Plastics;  Laterality: Bilateral;  . Liposuction with lipofilling Bilateral 12/12/2013    Procedure: LIPOSUCTION WITH LIPOFILLING;  Surgeon: Theodoro Kos, DO;  Location: [place];  Service: Plastics;  Laterality: Bilateral;  . Port-a-cath removal Left 12/12/2013    Procedure: REMOVAL PORT-A-CATH;  Surgeon: Theodoro Kos, DO;  Location: [facility];  Service: Plastics;  Laterality: Left;    FAMILY HISTORY Family History  Problem Relation Age of Onset  . Hypertension Mother   . Alcohol abuse Mother   .  Heart disease Maternal Grandmother   . Stroke Maternal Grandfather   . Colon cancer Maternal Aunt 53    alive at 29  . Brain cancer Maternal Uncle 38    and lymphoma in early 32s; deceased  . Brain cancer Maternal Uncle 60    deceased  . Pancreatic cancer Maternal Uncle 45    alive at 50  . Breast cancer Maternal Aunt     great aunt through mat GF; dx at ? age  . Breast cancer Cousin 70    mat 1st cousin once removed through mat GF ; deceased   the patient's father died in an automobile accident in his early 29s. The patient's mother is living, currently age 4. The patient has one brother and one sister. There is no family history of breast or ovarian cancer, although there are  maternal uncles with brain, colon and lymph node cancers.  GYNECOLOGIC HISTORY:  (Updated 09/12/2013) Menarche age 39, first live birth age 42, she is [place] P2. She stopped having periods with her hysterectomy in March of 2009. She does have "premenstrual symptoms" on a regular basis.  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 Her husband Sherry Barry works for a company in the [place] area doing testing. Their children Jaden (10) and Bryson (5) at home.   ADVANCED DIRECTIVES: not in place  HEALTH MAINTENANCE:  (Updated 09/12/2013) History  Substance Use Topics  . Smoking status: Never Smoker   . Smokeless tobacco: Never Used  . Alcohol Use: Yes     Comment: occasional    Mammogram: s/p bilateral mastectomy Colonoscopy: n/a Bone Density Scan: n/a Pap Smear: s/p TAH Eye Exam: 10/2013 Lipid Panel: normal 3 years ago    Allergies  Allergen Reactions  . Codeine Itching  . Latex Swelling    Current Outpatient Prescriptions  Medication Sig Dispense Refill  . LORazepam (ATIVAN) 0.5 MG tablet Take 0.5-1 tablets (0.25-0.5 mg total) by mouth 2 (two) times daily as needed for anxiety (Nausea or vomiting).  30 tablet  0  . Multiple Vitamins-Minerals (HAIR/SKIN/NAILS PO) Take 1 tablet by mouth daily.      . tamoxifen (NOLVADEX) 20 MG tablet Take 20 mg by mouth daily.      [name] triamcinolone (NASACORT) 55 MCG/ACT AERO nasal inhaler Place 2 sprays into the nose daily.       . bisacodyl (DULCOLAX) 10 MG suppository Place 1 suppository (10 mg total) rectally daily as needed for moderate constipation.  12 suppository  0  . diazepam (VALIUM) 2 MG tablet Take 1 tablet (2 mg total) by mouth every 12 (twelve) hours as needed for muscle spasms.  30 tablet  0  . pimecrolimus (ELIDEL) 1 % cream Apply topically 2 (two) times daily.  60 g  1   No current facility-administered medications for this visit.    OBJECTIVE:  Young-appearing African American woman  appears well and is in no acute distress Filed Vitals:   12/25/13 1048  BP: 128/90  Pulse:   Temp:   Resp:      Body mass index is 25.23 kg/(m^2).    ECOG FS: 0 Filed Weights   12/25/13 1030  Weight: 138 lb (62.596 kg)   Physical Exam: HEENT:  Sclerae anicteric.  Oropharynx clear, pink, and moist, with no mucositis or thrush. Neck is supple. Trachea midline with no thyromegaly palpated.   NODES:  No cervical or supraclavicular lymphadenopathy palpated.  BREAST EXAM:  S/p reconstruction, no swelling, healing well, benign bilateral breast exam, no axillary adenopathy palpated LUNGS:  Clear to auscultation bilaterally with good excursion.  No crackles, wheezes or rhonchi. HEART:  Regular rate and rhythm. No murmur appreciated ABDOMEN:  Soft, nontender to palpation . No organomegaly or masses. Positive bowel sounds.  MSK:  No focal spinal tenderness to palpation. Full range of motion in the upper extremities.  EXTREMITIES:  No peripheral edema.  No lymphedema in the upper extremities. SKIN:  Benign with no new rashes or skin lesions. No excessive ecchymoses. No petechiae.  NEURO:  Nonfocal. Well oriented.  Appropriate affect.    LAB RESULTS: Lab Results  Component Value Date   WBC 6.3 12/25/2013   NEUTROABS 4.2 12/25/2013   HGB 14.2 12/25/2013   HCT 41.9 12/25/2013   MCV 86.0 12/25/2013   PLT 225 12/25/2013      Chemistry      Component Value Date/Time   NA 141 12/25/2013 1007   NA 140 09/24/2013 2121   K 3.3* 12/25/2013 1007   K 4.2 09/24/2013 2121   CL 104 09/24/2013 2121   CL 101 11/15/2012 0904   CO2 25 12/25/2013 1007   CO2 20 09/24/2013 2121   BUN 12.1 12/25/2013 1007   BUN 10 09/24/2013 2121   CREATININE 0.8 12/25/2013 1007   CREATININE 0.58 09/24/2013 2121   CREATININE 0.80 05/02/2012 1412      Component Value Date/Time   CALCIUM 9.1 12/25/2013 1007   CALCIUM 8.4 09/24/2013 2121   ALKPHOS 61 12/25/2013 1007   ALKPHOS 46 07/14/2012 2300    AST 16 12/25/2013 1007   AST 14 07/14/2012 2300   ALT 11 12/25/2013 1007   ALT 7 07/14/2012 2300   BILITOT 0.33 12/25/2013 1007   BILITOT 0.3 07/14/2012 2300       STUDIES:  Echocardiogram 04/30/2013 showed an ejection fraction of 60 - 65 %.   This is currently due to be repeated and has been ordered for early April.     ASSESSMENT: 43 y.o. [place] woman   (1)  status post right mastectomy and sentinel lymph node sampling 07/14/2012 (with simple left mastectomy)  for a pT2, pN1, stage IIB invasive ductal carcinoma, grade 3, estrogen receptor 100% and progesterone receptor 100% positive, with an MIB-1 of 48%, and HER-2 amplified by CISH with a ratio of 3.02.  (2) adjuvant chemotherapy started 08/17/2012, consisting of carboplatin, docetaxel and trastuzumab given every 3 weeks for 6 cycles, completed 11/30/2012  (3) trastuzumab continued for total of one year, final dose given on 08/30/2013 with good tolerance. Her last echocardiogram on 10/16/13 demonstrated a LVEF of 60-65%.  She was released from cardio-onc clinic by Dr. Shirlee Latch and will f/u with them PRN.    (4)  Status post radiation therapy, completed in early September  (5) started on tamoxifen in mid September 2014  (6) Eczema, treated with Elidel twice daily  (7)  Anxiety, lorazepam 0.5 mg twice a day as needed    (8) s/p bilateral breast reconstruction on 09/24/13 with latissmus flap and expander placement and on 12/12/13 with implant placement  PLAN:   Janyia is doing very well today.  She has no sign of recurrence and will continue taking Tamoxifen daily.  She is tolerating it moderately well.  She does have some vaginal dryness.  We discussed this in detail.  I recommended Replens three times per week and coconut oil prn.  She has not experienced any dyspareunia.  In regards to her  recent vaginal candida, I recommended she continue to f/u with her gynecologist for this and for treatment.  I recommended she wear cotton underwear  and avoid tight fitting garments.    We reviewed her health maintenance above.  I counseled her on survivorship inculding healthy diet, exercise and monthly breast exams.  She hasn't seen her PCP recently, and I did recommend that she f/u with them at least monthly.       All this was reviewed with her in detail today, and she voices understanding and agreement with our plan. She will call with any changes or problems.  I spent 25 minutes counseling the patient face to face.  The total time spent in the appointment was 30 minutes.  Minette Headland, [place] [phone] 12/25/2013

## 2014-02-05 ENCOUNTER — Other Ambulatory Visit: Payer: Self-pay | Admitting: *Deleted

## 2014-02-05 DIAGNOSIS — C50211 Malignant neoplasm of upper-inner quadrant of right female breast: Secondary | ICD-10-CM

## 2014-02-05 MED ORDER — TAMOXIFEN CITRATE 20 MG PO TABS: 20.0000 mg | ORAL_TABLET | Freq: Every day | ORAL | Status: DC

## 2014-02-26 ENCOUNTER — Ambulatory Visit (INDEPENDENT_AMBULATORY_CARE_PROVIDER_SITE_OTHER): Payer: BC Managed Care – PPO | Admitting: Surgery

## 2014-04-15 ENCOUNTER — Encounter: Payer: Self-pay | Admitting: Adult Health

## 2014-05-29 ENCOUNTER — Telehealth: Payer: Self-pay | Admitting: Oncology

## 2014-05-29 NOTE — Telephone Encounter (Signed)
S/w pt  advised appt chg due to md on pal 1/21. Gave new appts for lab on 2/1 @ 4pm and Ml 2/8 @ 3.15pm.

## 2014-06-03 ENCOUNTER — Ambulatory Visit (INDEPENDENT_AMBULATORY_CARE_PROVIDER_SITE_OTHER): Payer: BC Managed Care – PPO | Admitting: Emergency Medicine

## 2014-06-03 VITALS — BP 134/88 | HR 90 | Temp 98.3°F | Resp 18 | Ht 63.5 in | Wt 138.0 lb

## 2014-06-03 DIAGNOSIS — H1030 Unspecified acute conjunctivitis, unspecified eye: Secondary | ICD-10-CM

## 2014-06-03 MED ORDER — OFLOXACIN 0.3 % OP SOLN: 1.0000 [drp] | OPHTHALMIC | Status: DC

## 2014-06-03 NOTE — Progress Notes (Signed)
[facility] [address] [place] [address] [phone]  Date:  06/03/2014   Name:  Sherry Barry   DOB:  12-11-1970   MRN:  [id number]  PCP:  Lynne Logan, MD    Chief Complaint: Eye Problem   History of Present Illness:  Sherry Barry is a 43 y.o. very pleasant female patient who presents with the following:  Patient awoke yesterday with a glued right eye.  She has no history of injury or antecedent infection No fever or chills No coryza. Has no drainage but eye is "red" No visual complaints No improvement with over the counter medications or other home remedies.  Denies other complaint or health concern today.   Patient Active Problem List   Diagnosis Date Noted  . Acquired absence of bilateral breasts and nipples 09/24/2013  . History of breast cancer 08/17/2013  . Anxiety 06/28/2013  . Eczema 06/28/2013  . Cancer of upper-inner quadrant of female breast 06/20/2012  . HTN (hypertension) 05/02/2012  . Migraine 04/17/2012    Past Medical History  Diagnosis Date  . Allergy   . Migraine   . Migraine   . History of migraine     last one about a week ago  . History of chemotherapy     doxetaxel/carboplatin/trastuzumab  . Breast cancer   . Breast cancer     right  . Hx of radiation therapy 01/01/13- 02/15/13    r chest wall, r supraclav/axilla 5040 cGy/28 sessions, scar boost 1000 cGy/5 sessions  . Hypertension     no meds,   urgent care on pomana    Past Surgical History  Procedure Laterality Date  . Simple mastectomy with axillary sentinel node biopsy  07/14/2012    Procedure: SIMPLE MASTECTOMY WITH AXILLARY SENTINEL NODE BIOPSY;  Surgeon: Joyice Faster. Cornett, MD;  Location: [facility];  Service: General;  Laterality: Right;  Bilateral simple mastectomy with port and right sebtibel lymph node mapping  . Simple mastectomy with axillary sentinel node biopsy  07/14/2012    Procedure: SIMPLE MASTECTOMY;  Surgeon: Joyice Faster. Cornett,  MD;  Location: [facility];  Service: General;  Laterality: Left;  . Portacath placement  07/14/2012    Procedure: INSERTION PORT-A-CATH;  Surgeon: Joyice Faster. Cornett, MD;  Location: [facility];  Service: General;  Laterality: Left;  . Abdominal hysterectomy      no salpingo-oophorectomy  . Breast reconstruction with placement of tissue expander and flex hd (acellular hydrated dermis) Left 09/24/2013  . Reconstruction breast w/ latissimus dorsi flap Right 09/24/2013    "& tissue expander placement"  . Latissimus flap to breast Right 09/24/2013    Procedure: RIGHT LATISSMUS MYOCUTAEIOUS MUSCLE FLAP AND PLACEMENT OF TISSUE [name];  Surgeon: Theodoro Kos, DO;  Location: [facility];  Service: Plastics;  Laterality: Right;  . Tissue expander placement Left 09/24/2013    Procedure: PLACEMENT OF TISSUE EXPANDER AND FLEX HD TO LEFT BREAST;  Surgeon: Theodoro Kos, DO;  Location: [facility];  Service: Plastics;  Laterality: Left;  . Removal of bilateral tissue expanders with placement of bilateral breast implants Bilateral 12/12/2013    Procedure: REMOVAL OF BILATERAL TISSUE EXPANDERS WITH PLACEMENT OF BILATERAL BREAST IMPLANTS/BILATERAL CAPSULECTOMIES WITH  LIPOFILLING FAT GRAFTING;  Surgeon: Theodoro Kos, DO;  Location: [facility];  Service: Plastics;  Laterality: Bilateral;  . Liposuction with lipofilling Bilateral 12/12/2013    Procedure: LIPOSUCTION WITH LIPOFILLING;  Surgeon: Theodoro Kos, DO;  Location: [facility];  Service: Plastics;  Laterality:  Bilateral;  . Port-a-cath removal Left 12/12/2013    Procedure: REMOVAL PORT-A-CATH;  Surgeon: Theodoro Kos, DO;  Location: [facility] [place];  Service: Plastics;  Laterality: Left;    History  Substance Use Topics  . Smoking status: Never Smoker   . Smokeless tobacco: Never Used  . Alcohol Use: Yes     Comment: occasional    Family History  Problem Relation Age of Onset  . Hypertension Mother   . Alcohol abuse Mother   . Heart  disease Maternal Grandmother   . Stroke Maternal Grandfather   . Colon cancer Maternal Aunt 37    alive at 18  . Brain cancer Maternal Uncle 32    and lymphoma in early 58s; deceased  . Brain cancer Maternal Uncle 60    deceased  . Pancreatic cancer Maternal Uncle 45    alive at 16  . Breast cancer Maternal Aunt     great aunt through mat GF; dx at ? age  . Breast cancer Cousin 52    mat 1st cousin once removed through mat GF ; deceased    Allergies  Allergen Reactions  . Codeine Itching  . Latex Swelling    Medication list has been reviewed and updated.  Current Outpatient Prescriptions on File Prior to Visit  Medication Sig Dispense Refill  . Multiple Vitamins-Minerals (HAIR/SKIN/NAILS PO) Take 1 tablet by mouth daily.    . pimecrolimus (ELIDEL) 1 % cream Apply topically 2 (two) times daily. 60 g 1  . tamoxifen (NOLVADEX) 20 MG tablet Take 1 tablet (20 mg total) by mouth daily. 90 tablet 1  . LORazepam (ATIVAN) 0.5 MG tablet Take 0.5-1 tablets (0.25-0.5 mg total) by mouth 2 (two) times daily as needed for anxiety (Nausea or vomiting). (Patient not taking: Reported on 06/03/2014) 30 tablet 0  . triamcinolone (NASACORT) 55 MCG/ACT AERO nasal inhaler Place 2 sprays into the nose daily.      No current facility-administered medications on file prior to visit.    Review of Systems:  As per HPI, otherwise negative.    Physical Examination: Filed Vitals:   06/03/14 1627  BP: 134/88  Pulse: 90  Temp: 98.3 F (36.8 C)  Resp: 18   Filed Vitals:   06/03/14 1627  Height: 5' 3.5" (1.613 m)  Weight: 138 lb (62.596 kg)   Body mass index is 24.06 kg/(m^2). Ideal Body Weight: Weight in (lb) to have BMI = 25: 143.1   GEN: WDWN, NAD, Non-toxic, Alert & Oriented x 3 HEENT: Atraumatic, Normocephalic. Right conjunctival injection. No drainage no hyphema.  Negative fluorescein.  No FB. Ears and Nose: No external deformity. EXTR: No clubbing/cyanosis/edema NEURO: Normal gait.   PSYCH: Normally interactive. Conversant. Not depressed or anxious appearing.  Calm demeanor.     Assessment and Plan: Conjunctivitis [place]  Signed,  Ellison Carwin, MD

## 2014-06-03 NOTE — Patient Instructions (Signed)

## 2014-06-27 ENCOUNTER — Other Ambulatory Visit: Payer: BC Managed Care – PPO

## 2014-07-04 ENCOUNTER — Ambulatory Visit: Payer: BC Managed Care – PPO | Admitting: Oncology

## 2014-07-12 ENCOUNTER — Other Ambulatory Visit: Payer: Self-pay | Admitting: *Deleted

## 2014-07-12 DIAGNOSIS — C50211 Malignant neoplasm of upper-inner quadrant of right female breast: Secondary | ICD-10-CM

## 2014-07-15 ENCOUNTER — Other Ambulatory Visit (HOSPITAL_BASED_OUTPATIENT_CLINIC_OR_DEPARTMENT_OTHER): Payer: Federal, State, Local not specified - PPO

## 2014-07-15 DIAGNOSIS — C50219 Malignant neoplasm of upper-inner quadrant of unspecified female breast: Secondary | ICD-10-CM

## 2014-07-15 DIAGNOSIS — C50211 Malignant neoplasm of upper-inner quadrant of right female breast: Secondary | ICD-10-CM

## 2014-07-15 LAB — COMPREHENSIVE METABOLIC PANEL (CC13)
ALBUMIN: 3.5 g/dL (ref 3.5–5.0)
ALK PHOS: 68 U/L (ref 40–150)
ALT: 11 U/L (ref 0–55)
AST: 14 U/L (ref 5–34)
Anion Gap: 10 mEq/L (ref 3–11)
BILIRUBIN TOTAL: 0.28 mg/dL (ref 0.20–1.20)
BUN: 13.7 mg/dL (ref 7.0–26.0)
CALCIUM: 8.6 mg/dL (ref 8.4–10.4)
CHLORIDE: 107 meq/L (ref 98–109)
CO2: 25 meq/L (ref 22–29)
Creatinine: 0.8 mg/dL (ref 0.6–1.1)
Glucose: 89 mg/dl (ref 70–140)
POTASSIUM: 3.5 meq/L (ref 3.5–5.1)
SODIUM: 141 meq/L (ref 136–145)
TOTAL PROTEIN: 6.6 g/dL (ref 6.4–8.3)

## 2014-07-15 LAB — CBC WITH DIFFERENTIAL/PLATELET
BASO%: 0.5 % (ref 0.0–2.0)
BASOS ABS: 0 10*3/uL (ref 0.0–0.1)
EOS%: 1.3 % (ref 0.0–7.0)
Eosinophils Absolute: 0.1 10*3/uL (ref 0.0–0.5)
HCT: 42.7 % (ref 34.8–46.6)
HEMOGLOBIN: 13.9 g/dL (ref 11.6–15.9)
LYMPH%: 25.5 % (ref 14.0–49.7)
MCH: 28.6 pg (ref 25.1–34.0)
MCHC: 32.5 g/dL (ref 31.5–36.0)
MCV: 87.9 fL (ref 79.5–101.0)
MONO#: 0.4 10*3/uL (ref 0.1–0.9)
MONO%: 5.4 % (ref 0.0–14.0)
NEUT%: 67.3 % (ref 38.4–76.8)
NEUTROS ABS: 5 10*3/uL (ref 1.5–6.5)
Platelets: 195 10*3/uL (ref 145–400)
RBC: 4.86 10*6/uL (ref 3.70–5.45)
RDW: 12.5 % (ref 11.2–14.5)
WBC: 7.5 10*3/uL (ref 3.9–10.3)
lymph#: 1.9 10*3/uL (ref 0.9–3.3)

## 2014-07-22 ENCOUNTER — Telehealth: Payer: Self-pay | Admitting: Oncology

## 2014-07-22 ENCOUNTER — Encounter: Payer: Self-pay | Admitting: Nurse Practitioner

## 2014-07-22 ENCOUNTER — Ambulatory Visit (HOSPITAL_BASED_OUTPATIENT_CLINIC_OR_DEPARTMENT_OTHER): Payer: Federal, State, Local not specified - PPO | Admitting: Nurse Practitioner

## 2014-07-22 VITALS — BP 129/92 | HR 99 | Temp 98.9°F | Resp 18 | Wt 136.5 lb

## 2014-07-22 DIAGNOSIS — F419 Anxiety disorder, unspecified: Secondary | ICD-10-CM

## 2014-07-22 DIAGNOSIS — L309 Dermatitis, unspecified: Secondary | ICD-10-CM

## 2014-07-22 DIAGNOSIS — B379 Candidiasis, unspecified: Secondary | ICD-10-CM

## 2014-07-22 DIAGNOSIS — C50211 Malignant neoplasm of upper-inner quadrant of right female breast: Secondary | ICD-10-CM

## 2014-07-22 MED ORDER — FLUCONAZOLE 150 MG PO TABS
150.0000 mg | ORAL_TABLET | ORAL | Status: DC
Start: 2014-07-22 — End: 2015-01-27

## 2014-07-22 NOTE — Progress Notes (Signed)
ID: Sherry Barry   DOB: [birth year]  MR#: [id number]  NUU#:[id number]  PCP: Lynne Logan, MD GYN: Everett Graff. MD SU: Erroll Luna. 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 At that time a digital bilateal diagnostic mammogram showed a slight decrease in size of the benign fibroadenoma in the left breast compared to 2010. The same mammogram, however, also refilled pleomorphic calcifications extending over 9 cm in the inner third of the right breast. There were no suspicious calcifications in the left breast. On exam, Dr. Purcell Nails was able to palpate a small freely mobile mass in the lower inner left breast, but was unable to palpate any abnormalities in the right breast other than some focal thickening.  An subsequent ultrasound of the right breast on 06/16/2012 showed multiple hypoechoic masses throughout the upper quadrants of the right breast. The palpable mass in the 1:00 location corresponded with an irregular hypoechoic mass measuring 1.7 x 1.4 cm. There was also a discrete hypoechoic lobulated mass at the 11:00 position measuring 2.1 x 1.1 x 2.1 cm. The third lesion identified at the 11:00 location, 3 cm from the nipple, measured 1.0 x 0.8 x 0.9 cm.   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  Bilateral breast MRI 06/23/2012 showed an enhancing mass in the upper central right breast measuring 1.8 cm, together with clumped nodular enhancement  extending for a total area of 11.4 cm. In the upper outer quadrant of this breast there were 3 discrete irregular masses measuring up to 3 cm. This correlated well with the ultrasound findings previously. In the left breast there was a large area of clumped nodular enhancement measuring up to 8.9 cm. In the lower central portion there was a 1.7 cm circumscribed nodule consistent with a known fibroadenoma. There weas no axillary or internal mammary adenopathy noted on either side.   The 8.9 cm area of abnormality in the left breast was biopsied on 06/30/2012 showing fibrocystic changes with adenosis and calcifications, pseudoangiomatous stromal hyperplasia, but no evidence of malignancy.  The patient underwent bilateral mastectomies under the care of Dr. Brantley Stage on 07/14/2012. The left mastectomy revealed benign breast tissue and was negative for malignancy. 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 Lymphovascular invasion was identified; #2) a ductal carcinoma in situ, grade 3, with comedo necrosis.  Surgical margins were negative. 4 lymph nodes were examined, one positive for metastatic mammary carcinoma, a second positive for isolated tumor cells. pT2, pN1a  Subsequent history is detailed below.  INTERVAL HISTORY: Sherry Barry returns today for follow up of her right breast cancer. She has been on tamoxifen since September 2014. Her main complaint is recurrent yeast infections that occur about every 4-6 weeks. She has been treated by her gynecologist with miconazole suppositories, but these are not always helpful and then infection always returns. She has vaginal wetness and changes her panty liners  often as advised. Otherwise she has minimal hot flashes and no joint aches.  REVIEW OF SYSTEMS: Mykaylah denies fevers, chills, nausea, vomiting, or changes in bowel or bladder habits. She has no shortness of  breath, chest pain, cough, palpitations, or fatigue. She denies headaches, dizziness, unexplained weight loss, night sweats, bruising or bleeding. A detailed review of systems is otherwise negative.   PAST MEDICAL HISTORY: Past Medical History  Diagnosis Date  . Allergy   . Migraine   . Migraine   . History of migraine     last one about a week ago  . History of chemotherapy     doxetaxel/carboplatin/trastuzumab  . Breast cancer   . Breast cancer     right  . Hx of radiation therapy 01/01/13- 02/15/13    r chest wall, r supraclav/axilla 5040 cGy/28 sessions, scar boost 1000 cGy/5 sessions  . Hypertension     no meds,   urgent care on pomana    PAST SURGICAL HISTORY: Past Surgical History  Procedure Laterality Date  . Simple mastectomy with axillary sentinel node biopsy  07/14/2012    Procedure: SIMPLE MASTECTOMY WITH AXILLARY SENTINEL NODE BIOPSY;  Surgeon: Joyice Faster. Cornett, MD;  Location: [place];  Service: General;  Laterality: Right;  Bilateral simple mastectomy with port and right sebtibel lymph node mapping  . Simple mastectomy with axillary sentinel node biopsy  07/14/2012    Procedure: SIMPLE MASTECTOMY;  Surgeon: Joyice Faster. Cornett, MD;  Location: [place];  Service: General;  Laterality: Left;  . Portacath placement  07/14/2012    Procedure: INSERTION PORT-A-CATH;  Surgeon: Joyice Faster. Cornett, MD;  Location: [place];  Service: General;  Laterality: Left;  . Abdominal hysterectomy      no salpingo-oophorectomy  . Breast reconstruction with placement of tissue expander and flex hd (acellular hydrated dermis) Left 09/24/2013  . Reconstruction breast w/ latissimus dorsi flap Right 09/24/2013    "& tissue expander placement"  . Latissimus flap to breast Right 09/24/2013    Procedure: RIGHT LATISSMUS MYOCUTAEIOUS MUSCLE FLAP AND PLACEMENT OF TISSUE [name];  Surgeon: Theodoro Kos, DO;  Location: [place];  Service: Plastics;  Laterality: Right;  . Tissue expander placement Left 09/24/2013     Procedure: PLACEMENT OF TISSUE EXPANDER AND FLEX HD TO LEFT BREAST;  Surgeon: Theodoro Kos, DO;  Location: [place];  Service: Plastics;  Laterality: Left;  . Removal of bilateral tissue expanders with placement of bilateral breast implants Bilateral 12/12/2013    Procedure: REMOVAL OF BILATERAL TISSUE EXPANDERS WITH PLACEMENT OF BILATERAL BREAST IMPLANTS/BILATERAL CAPSULECTOMIES WITH  LIPOFILLING FAT GRAFTING;  Surgeon: Theodoro Kos, DO;  Location: [place];  Service: Plastics;  Laterality: Bilateral;  . Liposuction with lipofilling Bilateral 12/12/2013    Procedure: LIPOSUCTION WITH LIPOFILLING;  Surgeon: Theodoro Kos, DO;  Location: [place];  Service: Plastics;  Laterality: Bilateral;  . Port-a-cath removal Left 12/12/2013    Procedure: REMOVAL PORT-A-CATH;  Surgeon: Theodoro Kos, DO;  Location: [place];  Service: Plastics;  Laterality: Left;    FAMILY HISTORY Family History  Problem Relation Age of Onset  . Hypertension Mother   . Alcohol abuse Mother   . Heart disease Maternal Grandmother   . Stroke Maternal Grandfather   . Colon cancer Maternal Aunt 41    alive at 56  . Brain cancer Maternal Uncle 59    and lymphoma in early 82s; deceased  . Brain cancer Maternal Uncle 60    deceased  . Pancreatic  cancer Maternal Uncle 45    alive at 62  . Breast cancer Maternal Aunt     great aunt through mat GF; dx at ? age  . Breast cancer Cousin 65    mat 1st cousin once removed through mat GF ; deceased   the patient's father died in an automobile accident in his early 68s. The patient's mother is living, currently age 63. The patient has one brother and one sister. There is no family history of breast or ovarian cancer, although there are maternal uncles with brain, colon and lymph node cancers.  GYNECOLOGIC HISTORY:  (Updated 09/12/2013) Menarche age 87, first live birth age 23, she is GX P2. She stopped having periods with her hysterectomy  in March of 2009. She does have "premenstrual symptoms" on a regular basis.  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 Her husband Sherry Barry works for a company in the [place] doing testing. Their children Sherry Barry (10) and Sherry Barry (5) at home.   ADVANCED DIRECTIVES: not in place  HEALTH MAINTENANCE:  (Updated 09/12/2013) History  Substance Use Topics  . Smoking status: Never Smoker   . Smokeless tobacco: Never Used  . Alcohol Use: Yes     Comment: occasional    Mammogram: s/p bilateral mastectomy Colonoscopy: n/a Bone Density Scan: n/a Pap Smear: s/p TAH Eye Exam: 10/2013 Lipid Panel: normal 3 years ago    Allergies  Allergen Reactions  . Codeine Itching  . Latex Swelling    Current Outpatient Prescriptions  Medication Sig Dispense Refill  . Multiple Vitamins-Minerals (HAIR/SKIN/NAILS PO) Take 1 tablet by mouth daily.    . pimecrolimus (ELIDEL) 1 % cream Apply topically 2 (two) times daily. 60 g 1  . tamoxifen (NOLVADEX) 20 MG tablet Take 1 tablet (20 mg total) by mouth daily. 90 tablet 1  . fluconazole (DIFLUCAN) 150 MG tablet Take 1 tablet (150 mg total) by mouth as directed. Take one tablet today, then repeat dose in 3 days. 2 tablet 3  . triamcinolone (NASACORT) 55 MCG/ACT AERO nasal inhaler Place 2 sprays into the nose daily.      No current facility-administered medications for this visit.    OBJECTIVE: Sherry Barry  appears well and is in no acute distress Filed Vitals:   07/22/14 1530  BP: 129/92  Pulse: 99  Temp: 98.9 F (37.2 C)  Resp: 18     Body mass index is 23.8 kg/(m^2).    ECOG FS: 0 Filed Weights   07/22/14 1530  Weight: 136 lb 8 oz (61.916 kg)   Physical Exam:  Skin: warm, dry  HEENT: sclerae anicteric, conjunctivae pink, oropharynx clear. No thrush or mucositis.  Lymph Nodes: No cervical or supraclavicular lymphadenopathy  Lungs:  clear to auscultation bilaterally, no rales, wheezes, or rhonci  Heart: regular rate and rhythm  Abdomen: round, soft, non tender, positive bowel sounds  Musculoskeletal: No focal spinal tenderness, no peripheral edema  Neuro: non focal, well oriented, positive affect  Breasts: bilateral breast status post reconstruction. No evidence of chest wall recurrence. Bilateral axillae benign.   LAB RESULTS: Lab Results  Component Value Date   WBC 7.5 07/15/2014   NEUTROABS 5.0 07/15/2014   HGB 13.9 07/15/2014   HCT 42.7 07/15/2014   MCV 87.9 07/15/2014   PLT 195 07/15/2014      Chemistry      Component Value Date/Time   NA 141 07/15/2014 1552   NA  140 09/24/2013 2121   K 3.5 07/15/2014 1552   K 4.2 09/24/2013 2121   CL 104 09/24/2013 2121   CL 101 11/15/2012 0904   CO2 25 07/15/2014 1552   CO2 20 09/24/2013 2121   BUN 13.7 07/15/2014 1552   BUN 10 09/24/2013 2121   CREATININE 0.8 07/15/2014 1552   CREATININE 0.58 09/24/2013 2121   CREATININE 0.80 05/02/2012 1412      Component Value Date/Time   CALCIUM 8.6 07/15/2014 1552   CALCIUM 8.4 09/24/2013 2121   ALKPHOS 68 07/15/2014 1552   ALKPHOS 46 07/14/2012 2300   AST 14 07/15/2014 1552   AST 14 07/14/2012 2300   ALT 11 07/15/2014 1552   ALT 7 07/14/2012 2300   BILITOT 0.28 07/15/2014 1552   BILITOT 0.3 07/14/2012 2300       STUDIES:  Most recent echocardiogram on 10/16/13 showed an ejection fraction of 60 - 65 %.     ASSESSMENT: 44 y.o. Sherry Barry Barry   (1)  status post right mastectomy and sentinel lymph node sampling 07/14/2012 (with simple left mastectomy)  for a pT2, pN1, stage IIB invasive ductal carcinoma, grade 3, estrogen receptor 100% and progesterone receptor 100% positive, with an MIB-1 of 48%, and HER-2 amplified by CISH with a ratio of 3.02.  (2) adjuvant chemotherapy started 08/17/2012, consisting of carboplatin, docetaxel and trastuzumab given every 3 weeks for 6 cycles, completed 11/30/2012  (3)  trastuzumab continued for total of one year, final dose given on 08/30/2013 with good tolerance. Her last echocardiogram on 10/16/13 demonstrated a LVEF of 60-65%.  She was released from cardio-onc clinic by Dr. Aundra Dubin and will f/u with them PRN.    (4)  Status post radiation therapy, completed in early September  (5) started on tamoxifen in mid September 2014  (6) Eczema, treated with Elidel twice daily  (7)  Anxiety, lorazepam 0.5 mg twice a day as needed    (8) s/p bilateral breast reconstruction on 09/24/13 with latissmus flap and expander placement and on 12/12/13 with implant placement  PLAN:  Celene is doing well as far as her breast cancer is concerned, and she is now 2 years out from her definitive surgery with no evidence of recurrent disease. The labs were reviewed in detail and were entirely normal. She is tolerating the tamoxifen well and will continue this until at least September 2017 before we consider switching to an aromatase inhibitor.   For her yeast I have written her for fluconazole 136m to be taken once then repeated 3 days later. She may find better results with this oral antifungal than the vaginal suppository composition. She has also read up on a few blogs about creating her own suppositories using boric acid to reset her pH. From what I have read this is generally well tolerated, but I advised she review this choice with her gynecologist.   SShanaiaunderstands and agrees with this plan. She knows the goal of treatment in her case is cure. She has been encouraged to call with any issues that might arise before her next visit here.  FMarcelino Duster [phone]/01/2015

## 2014-07-22 NOTE — Telephone Encounter (Signed)
gv adn printed appt sched adn avs for pt for Aug

## 2014-07-28 ENCOUNTER — Other Ambulatory Visit: Payer: Self-pay | Admitting: Oncology

## 2014-07-29 ENCOUNTER — Other Ambulatory Visit: Payer: Self-pay | Admitting: *Deleted

## 2014-07-29 DIAGNOSIS — C50211 Malignant neoplasm of upper-inner quadrant of right female breast: Secondary | ICD-10-CM

## 2014-07-29 MED ORDER — TAMOXIFEN CITRATE 20 MG PO TABS: 20.0000 mg | ORAL_TABLET | Freq: Every day | ORAL | Status: DC

## 2015-01-18 ENCOUNTER — Ambulatory Visit (INDEPENDENT_AMBULATORY_CARE_PROVIDER_SITE_OTHER): Payer: Federal, State, Local not specified - PPO | Admitting: Family Medicine

## 2015-01-18 VITALS — BP 120/85 | HR 86 | Temp 98.4°F | Resp 17 | Ht 63.0 in | Wt 134.0 lb

## 2015-01-18 DIAGNOSIS — Z8669 Personal history of other diseases of the nervous system and sense organs: Secondary | ICD-10-CM

## 2015-01-18 DIAGNOSIS — J0100 Acute maxillary sinusitis, unspecified: Secondary | ICD-10-CM

## 2015-01-18 MED ORDER — BUTALBITAL-APAP-CAFFEINE 50-325-40 MG PO TABS: 1.0000 | ORAL_TABLET | Freq: Four times a day (QID) | ORAL | Status: DC | PRN

## 2015-01-18 MED ORDER — AZITHROMYCIN 250 MG PO TABS: ORAL_TABLET | ORAL | Status: DC

## 2015-01-18 NOTE — Patient Instructions (Signed)

## 2015-01-18 NOTE — Progress Notes (Signed)
 Chief Complaint:  Chief Complaint  Patient presents with  . Headache    and sinus pressure x's 3 days    HPI: Sherry Barry is a 44 y.o. female who reports to [facility] today complaining of 3 day history of sinus pressure and headache, mostly on right side . She has not had a migraine in over 2 years, she had right breast cancer s/pcbreast cancer. She weaned off topomax for her HA since she did not like how it made her feel. She has tried tylenol, [facility] sinus meds, she used to get sinus infections a lot. She has some ear clogging, ringing in her ear. No cough, some clear drainage in the back of her throat. Denies fevers or chills  Past Medical History  Diagnosis Date  . Allergy   . Migraine   . Migraine   . History of migraine     last one about a week ago  . History of chemotherapy     doxetaxel/carboplatin/trastuzumab  . Breast cancer   . Breast cancer     right  . Hx of radiation therapy 01/01/13- 02/15/13    r chest wall, r supraclav/axilla 5040 cGy/28 sessions, scar boost 1000 cGy/5 sessions  . Hypertension     no meds,   urgent care on pomana   Past Surgical History  Procedure Laterality Date  . Simple mastectomy with axillary sentinel node biopsy  07/14/2012    Procedure: SIMPLE MASTECTOMY WITH AXILLARY SENTINEL NODE BIOPSY;  Surgeon: Joyice Faster. Cornett, MD;  Location: [place];  Service: General;  Laterality: Right;  Bilateral simple mastectomy with port and right sebtibel lymph node mapping  . Simple mastectomy with axillary sentinel node biopsy  07/14/2012    Procedure: SIMPLE MASTECTOMY;  Surgeon: Joyice Faster. Cornett, MD;  Location: [place];  Service: General;  Laterality: Left;  . Portacath placement  07/14/2012    Procedure: INSERTION PORT-A-CATH;  Surgeon: Joyice Faster. Cornett, MD;  Location: [place];  Service: General;  Laterality: Left;  . Abdominal hysterectomy      no salpingo-oophorectomy  . Breast reconstruction with placement of tissue expander and flex hd (acellular  hydrated dermis) Left 09/24/2013  . Reconstruction breast w/ latissimus dorsi flap Right 09/24/2013    "& tissue expander placement"  . Latissimus flap to breast Right 09/24/2013    Procedure: RIGHT LATISSMUS MYOCUTAEIOUS MUSCLE FLAP AND PLACEMENT OF TISSUE [name];  Surgeon: Theodoro Kos, DO;  Location: [place];  Service: Plastics;  Laterality: Right;  . Tissue expander placement Left 09/24/2013    Procedure: PLACEMENT OF TISSUE EXPANDER AND FLEX HD TO LEFT BREAST;  Surgeon: Theodoro Kos, DO;  Location: [place];  Service: Plastics;  Laterality: Left;  . Removal of bilateral tissue expanders with placement of bilateral breast implants Bilateral 12/12/2013    Procedure: REMOVAL OF BILATERAL TISSUE EXPANDERS WITH PLACEMENT OF BILATERAL BREAST IMPLANTS/BILATERAL CAPSULECTOMIES WITH  LIPOFILLING FAT GRAFTING;  Surgeon: Theodoro Kos, DO;  Location: [place];  Service: Plastics;  Laterality: Bilateral;  . Liposuction with lipofilling Bilateral 12/12/2013    Procedure: LIPOSUCTION WITH LIPOFILLING;  Surgeon: Theodoro Kos, DO;  Location: [place];  Service: Plastics;  Laterality: Bilateral;  . Port-a-cath removal Left 12/12/2013    Procedure: REMOVAL PORT-A-CATH;  Surgeon: Theodoro Kos, DO;  Location: [place];  Service: Plastics;  Laterality: Left;   History   Social History  . Marital Status: Married    Spouse Name: N/A  . Number of  Children: 2  . Years of Education: N/A   Occupational History  .  [employer]   Social History Main Topics  . Smoking status: Never Smoker   . Smokeless tobacco: Never Used  . Alcohol Use: Yes     Comment: occasional  . Drug Use: No  . Sexual Activity: Yes    Birth Control/ Protection: Surgical   Other Topics Concern  . None   Social History Narrative   Family History  Problem Relation Age of Onset  . Hypertension Mother   . Alcohol abuse Mother   . Heart disease Maternal Grandmother   . Stroke Maternal  Grandfather   . Colon cancer Maternal Aunt 30    alive at 62  . Brain cancer Maternal Uncle 33    and lymphoma in early 110s; deceased  . Brain cancer Maternal Uncle 60    deceased  . Pancreatic cancer Maternal Uncle 45    alive at 35  . Breast cancer Maternal Aunt     great aunt through mat GF; dx at ? age  . Breast cancer Cousin 69    mat 1st cousin once removed through mat GF ; deceased   Allergies  Allergen Reactions  . Codeine Itching  . Latex Swelling   Prior to Admission medications   Medication Sig Start Date End Date Taking? Authorizing Provider  tamoxifen (NOLVADEX) 20 MG tablet Take 1 tablet (20 mg total) by mouth daily. 07/29/14  Yes Chauncey Cruel, MD  fluconazole (DIFLUCAN) 150 MG tablet Take 1 tablet (150 mg total) by mouth as directed. Take one tablet today, then repeat dose in 3 days. Patient not taking: Reported on 01/18/2015 07/22/14   Laurie Panda, NP  Multiple Vitamins-Minerals (HAIR/SKIN/NAILS PO) Take 1 tablet by mouth daily.    Historical Provider, MD  pimecrolimus (ELIDEL) 1 % cream Apply topically 2 (two) times daily. Patient not taking: Reported on 01/18/2015 06/28/13   Amy Milda Smart, PA-C  triamcinolone (NASACORT) 55 MCG/ACT AERO nasal inhaler Place 2 sprays into the nose daily.     Historical Provider, MD     ROS: The patient denies fevers, chills, night sweats, unintentional weight loss, chest pain, palpitations, wheezing, dyspnea on exertion, nausea, vomiting, abdominal pain, dysuria, hematuria, melena, numbness, weakness, or tingling.  All other systems have been reviewed and were otherwise negative with the exception of those mentioned in the HPI and as above.    PHYSICAL EXAM: Filed Vitals:   01/18/15 1028  BP: 120/85  Pulse: 86  Temp: 98.4 F (36.9 C)  Resp: 17   Body mass index is 23.74 kg/(m^2).   General: Alert, no acute distress HEENT:  Normocephalic, atraumatic, oropharynx patent. EOMI, PERRLA, fundo exam normal Erythematous  throat, no exudates, TM normal, + sinus tenderness, + erythematous/boggy nasal mucosa Cardiovascular:  Regular rate and rhythm, no rubs murmurs or gallops.   No pedal edema.  Respiratory: Clear to auscultation bilaterally.  No wheezes, rales, or rhonchi.  No cyanosis, no use of accessory musculature Abdominal: No organomegaly, abdomen is soft and non-tender, positive bowel sounds. No masses. Skin: No rashes. Neurologic: Facial musculature symmetric. Psychiatric: Patient acts appropriately throughout our interaction. Lymphatic: No cervical or submandibular lymphadenopathy Musculoskeletal: Gait intact. No edema, tenderness   LABS: Results for orders placed or performed in visit on 07/15/14  CBC with Differential  Result Value Ref Range   WBC 7.5 3.9 - 10.3 10e3/uL   NEUT# 5.0 1.5 - 6.5 10e3/uL   HGB 13.9 11.6 -  15.9 g/dL   HCT 42.7 34.8 - 46.6 %   Platelets 195 145 - 400 10e3/uL   MCV 87.9 79.5 - 101.0 fL   MCH 28.6 25.1 - 34.0 pg   MCHC 32.5 31.5 - 36.0 g/dL   RBC 4.86 3.70 - 5.45 10e6/uL   RDW 12.5 11.2 - 14.5 %   lymph# 1.9 0.9 - 3.3 10e3/uL   MONO# 0.4 0.1 - 0.9 10e3/uL   Eosinophils Absolute 0.1 0.0 - 0.5 10e3/uL   Basophils Absolute 0.0 0.0 - 0.1 10e3/uL   NEUT% 67.3 38.4 - 76.8 %   LYMPH% 25.5 14.0 - 49.7 %   MONO% 5.4 0.0 - 14.0 %   EOS% 1.3 0.0 - 7.0 %   BASO% 0.5 0.0 - 2.0 %  Comprehensive metabolic panel (Cmet) - [facility]  Result Value Ref Range   Sodium 141 136 - 145 mEq/L   Potassium 3.5 3.5 - 5.1 mEq/L   Chloride 107 98 - 109 mEq/L   CO2 25 22 - 29 mEq/L   Glucose 89 70 - 140 mg/dl   BUN 13.7 7.0 - 26.0 mg/dL   Creatinine 0.8 0.6 - 1.1 mg/dL   Total Bilirubin 0.28 0.20 - 1.20 mg/dL   Alkaline Phosphatase 68 40 - 150 U/L   AST 14 5 - 34 U/L   ALT 11 0 - 55 U/L   Total Protein 6.6 6.4 - 8.3 g/dL   Albumin 3.5 3.5 - 5.0 g/dL   Calcium 8.6 8.4 - 10.4 mg/dL   Anion Gap 10 3 - 11 mEq/L   EGFR >90 >90 ml/min/1.73 m2     EKG/XRAY:   Primary read interpreted by  Dr. Marin Comment at [facility].   ASSESSMENT/PLAN: Encounter Diagnoses  Name Primary?  . Acute maxillary sinusitis, recurrence not specified Yes  . History of migraine headaches    Azithromycin, firoricet If no improvement then fu   Gross sideeffects, risk and benefits, and alternatives of medications d/w patient. Patient is aware that all medications have potential sideeffects and we are unable to predict every sideeffect or drug-drug interaction that may occur.    DO  01/20/2015 4:07 PM

## 2015-01-23 ENCOUNTER — Other Ambulatory Visit: Payer: Self-pay | Admitting: *Deleted

## 2015-01-23 DIAGNOSIS — C50219 Malignant neoplasm of upper-inner quadrant of unspecified female breast: Secondary | ICD-10-CM

## 2015-01-27 ENCOUNTER — Ambulatory Visit (HOSPITAL_BASED_OUTPATIENT_CLINIC_OR_DEPARTMENT_OTHER): Payer: Federal, State, Local not specified - PPO | Admitting: Oncology

## 2015-01-27 ENCOUNTER — Encounter: Payer: Self-pay | Admitting: Oncology

## 2015-01-27 ENCOUNTER — Other Ambulatory Visit (HOSPITAL_BASED_OUTPATIENT_CLINIC_OR_DEPARTMENT_OTHER): Payer: Federal, State, Local not specified - PPO

## 2015-01-27 ENCOUNTER — Telehealth: Payer: Self-pay | Admitting: Oncology

## 2015-01-27 VITALS — BP 134/96 | HR 100 | Temp 98.5°F | Resp 18 | Ht 63.0 in | Wt 134.4 lb

## 2015-01-27 DIAGNOSIS — C50219 Malignant neoplasm of upper-inner quadrant of unspecified female breast: Secondary | ICD-10-CM

## 2015-01-27 DIAGNOSIS — Z853 Personal history of malignant neoplasm of breast: Secondary | ICD-10-CM

## 2015-01-27 DIAGNOSIS — C50211 Malignant neoplasm of upper-inner quadrant of right female breast: Secondary | ICD-10-CM

## 2015-01-27 DIAGNOSIS — Z79810 Long term (current) use of selective estrogen receptor modulators (SERMs): Secondary | ICD-10-CM

## 2015-01-27 LAB — CBC WITH DIFFERENTIAL/PLATELET
BASO%: 0.4 % (ref 0.0–2.0)
Basophils Absolute: 0 10*3/uL (ref 0.0–0.1)
EOS ABS: 0.1 10*3/uL (ref 0.0–0.5)
EOS%: 1.8 % (ref 0.0–7.0)
HCT: 40.9 % (ref 34.8–46.6)
HEMOGLOBIN: 14.1 g/dL (ref 11.6–15.9)
LYMPH%: 27.3 % (ref 14.0–49.7)
MCH: 30.1 pg (ref 25.1–34.0)
MCHC: 34.5 g/dL (ref 31.5–36.0)
MCV: 87.4 fL (ref 79.5–101.0)
MONO#: 0.3 10*3/uL (ref 0.1–0.9)
MONO%: 5.7 % (ref 0.0–14.0)
NEUT%: 64.8 % (ref 38.4–76.8)
NEUTROS ABS: 3.5 10*3/uL (ref 1.5–6.5)
Platelets: 203 10*3/uL (ref 145–400)
RBC: 4.68 10*6/uL (ref 3.70–5.45)
RDW: 12.8 % (ref 11.2–14.5)
WBC: 5.5 10*3/uL (ref 3.9–10.3)
lymph#: 1.5 10*3/uL (ref 0.9–3.3)

## 2015-01-27 LAB — COMPREHENSIVE METABOLIC PANEL (CC13)
ALBUMIN: 3.6 g/dL (ref 3.5–5.0)
ALK PHOS: 61 U/L (ref 40–150)
ALT: 14 U/L (ref 0–55)
AST: 15 U/L (ref 5–34)
Anion Gap: 7 mEq/L (ref 3–11)
BILIRUBIN TOTAL: 0.48 mg/dL (ref 0.20–1.20)
BUN: 10.3 mg/dL (ref 7.0–26.0)
CO2: 25 mEq/L (ref 22–29)
CREATININE: 0.8 mg/dL (ref 0.6–1.1)
Calcium: 8.7 mg/dL (ref 8.4–10.4)
Chloride: 110 mEq/L — ABNORMAL HIGH (ref 98–109)
EGFR: >90 mL/min/{1.73_m2} (ref >90)
GLUCOSE: 83 mg/dL (ref 70–140)
Potassium: 3.5 mEq/L (ref 3.5–5.1)
SODIUM: 142 meq/L (ref 136–145)
TOTAL PROTEIN: 6.4 g/dL (ref 6.4–8.3)

## 2015-01-27 MED ORDER — TAMOXIFEN CITRATE 20 MG PO TABS: 20.0000 mg | ORAL_TABLET | Freq: Every day | ORAL | Status: DC

## 2015-01-27 NOTE — Progress Notes (Signed)
ID: Sherry Barry   DOB: 02/14/1971  MR#: [id number]  OVZ#:[id number]  PCP: Lynne Logan, MD GYN: Everett Graff. MD SU: Erroll Luna. 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 At that time a digital bilateal diagnostic mammogram showed a slight decrease in size of the benign fibroadenoma in the left breast compared to 2010. The same mammogram, however, also refilled pleomorphic calcifications extending over 9 cm in the inner third of the right breast. There were no suspicious calcifications in the left breast. On exam, Dr. Purcell Nails was able to palpate a small freely mobile mass in the lower inner left breast, but was unable to palpate any abnormalities in the right breast other than some focal thickening.  An subsequent ultrasound of the right breast on 06/16/2012 showed multiple hypoechoic masses throughout the upper quadrants of the right breast. The palpable mass in the 1:00 location corresponded with an irregular hypoechoic mass measuring 1.7 x 1.4 cm. There was also a discrete hypoechoic lobulated mass at the 11:00 position measuring 2.1 x 1.1 x 2.1 cm. The third lesion identified at the 11:00 location, 3 cm from the nipple, measured 1.0 x 0.8 x 0.9 cm.   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  Bilateral breast MRI 06/23/2012 showed an enhancing mass in the upper central right breast measuring 1.8 cm, together with  clumped nodular enhancement extending for a total area of 11.4 cm. In the upper outer quadrant of this breast there were 3 discrete irregular masses measuring up to 3 cm. This correlated well with the ultrasound findings previously. In the left breast there was a large area of clumped nodular enhancement measuring up to 8.9 cm. In the lower central portion there was a 1.7 cm circumscribed nodule consistent with a known fibroadenoma. There weas no axillary or internal mammary adenopathy noted on either side.   The 8.9 cm area of abnormality in the left breast was biopsied on 06/30/2012 showing fibrocystic changes with adenosis and calcifications, pseudoangiomatous stromal hyperplasia, but no evidence of malignancy.  The patient underwent bilateral mastectomies under the care of Dr. Brantley Stage on 07/14/2012. The left mastectomy revealed benign breast tissue and was negative for malignancy. 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 Lymphovascular invasion was identified; #2) a ductal carcinoma in situ, grade 3, with comedo necrosis.  Surgical margins were negative. 4 lymph nodes were examined, one positive for metastatic mammary carcinoma, a second positive for isolated tumor cells. pT2, pN1a  Subsequent history is detailed below.  INTERVAL HISTORY: Sherry Barry returns today for follow up of her right breast cancer. The interval history is generally unremarkable. She enjoys her job and was recently promoted. Family is doing well. She continues on tamoxifen. She has no hot flashes but some vaginal wetness issues. She had a yeast infection which was finally cleared on Diflucan. She  obtains a drug in about $12 a month.  REVIEW OF SYSTEMS: Sherry Barry used to have frequent migraines until she had her chemotherapy and since then they have pretty much disappeared. She status post hysterectomy but not bilateral  salpingo-oophorectomy so we do not know whether she is posterior premenopausal at this point. She uses her treadmill almost daily. A detailed review of systems was otherwise negative.  PAST MEDICAL HISTORY: Past Medical History  Diagnosis Date  . Allergy   . Migraine   . Migraine   . History of migraine     last one about a week ago  . History of chemotherapy     doxetaxel/carboplatin/trastuzumab  . Breast cancer   . Breast cancer     right  . Hx of radiation therapy 01/01/13- 02/15/13    r chest wall, r supraclav/axilla 5040 cGy/28 sessions, scar boost 1000 cGy/5 sessions  . Hypertension     no meds,   urgent care on [place]ana    PAST SURGICAL HISTORY: Past Surgical History  Procedure Laterality Date  . Simple mastectomy with axillary sentinel node biopsy  07/14/2012    Procedure: SIMPLE MASTECTOMY WITH AXILLARY SENTINEL NODE BIOPSY;  Surgeon: Joyice Faster. Cornett, MD;  Location: [facility];  Service: General;  Laterality: Right;  Bilateral simple mastectomy with port and right sebtibel lymph node mapping  . Simple mastectomy with axillary sentinel node biopsy  07/14/2012    Procedure: SIMPLE MASTECTOMY;  Surgeon: Joyice Faster. Cornett, MD;  Location: [facility];  Service: General;  Laterality: Left;  . Portacath placement  07/14/2012    Procedure: INSERTION PORT-A-CATH;  Surgeon: Joyice Faster. Cornett, MD;  Location: [facility];  Service: General;  Laterality: Left;  . Abdominal hysterectomy      no salpingo-oophorectomy  . Breast reconstruction with placement of tissue expander and flex hd (acellular hydrated dermis) Left 09/24/2013  . Reconstruction breast w/ latissimus dorsi flap Right 09/24/2013    "& tissue expander placement"  . Latissimus flap to breast Right 09/24/2013    Procedure: RIGHT LATISSMUS MYOCUTAEIOUS MUSCLE FLAP AND PLACEMENT OF TISSUE [name];  Surgeon: Theodoro Kos, DO;  Location: [facility];  Service: Plastics;  Laterality: Right;  . Tissue expander placement Left 09/24/2013    Procedure:  PLACEMENT OF TISSUE EXPANDER AND FLEX HD TO LEFT BREAST;  Surgeon: Theodoro Kos, DO;  Location: [facility];  Service: Plastics;  Laterality: Left;  . Removal of bilateral tissue expanders with placement of bilateral breast implants Bilateral 12/12/2013    Procedure: REMOVAL OF BILATERAL TISSUE EXPANDERS WITH PLACEMENT OF BILATERAL BREAST IMPLANTS/BILATERAL CAPSULECTOMIES WITH  LIPOFILLING FAT GRAFTING;  Surgeon: Theodoro Kos, DO;  Location: [facility];  Service: Plastics;  Laterality: Bilateral;  . Liposuction with lipofilling Bilateral 12/12/2013    Procedure: LIPOSUCTION WITH LIPOFILLING;  Surgeon: Theodoro Kos, DO;  Location: [facility];  Service: Plastics;  Laterality: Bilateral;  . Port-a-cath removal Left 12/12/2013    Procedure: REMOVAL PORT-A-CATH;  Surgeon: Theodoro Kos, DO;  Location: [facility];  Service: Plastics;  Laterality: Left;    FAMILY HISTORY Family History  Problem Relation Age of Onset  . Hypertension Mother   . Alcohol abuse Mother   . Heart disease Maternal Grandmother   . Stroke Maternal Grandfather   . Colon cancer Maternal Aunt 53    alive at 21  . Brain cancer Maternal Uncle 53    and lymphoma in early 60s; deceased  . Brain cancer Maternal Uncle 60    deceased  .  Pancreatic cancer Maternal Uncle 45    alive at 79  . Breast cancer Maternal Aunt     great aunt through mat GF; dx at ? age  . Breast cancer Cousin 58    mat 1st cousin once removed through mat GF ; deceased   the patient's father died in an automobile accident in his early 66s. The patient's mother is living, currently age 15. The patient has one brother and one sister. There is no family history of breast or ovarian cancer, although there are maternal uncles with brain, colon and lymph node cancers.  GYNECOLOGIC HISTORY:  (Updated 09/12/2013) Menarche age 61, first live birth age 41, she is [place] P2. She stopped having periods with her hysterectomy in March of  2009. She still has her ovaries in place.  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 Her husband Sherry Barry works for a company in the [place] doing testing. Their children Sherry Barry (11) and Sherry Barry (6) at home.   ADVANCED DIRECTIVES: not in place  HEALTH MAINTENANCE:  (Updated 09/12/2013) Social History  Substance Use Topics  . Smoking status: Never Smoker   . Smokeless tobacco: Never Used  . Alcohol Use: Yes     Comment: occasional    Mammogram: s/p bilateral mastectomy Colonoscopy: n/a Bone Density Scan: n/a Pap Smear: s/p TAH  Allergies  Allergen Reactions  . Codeine Itching  . Latex Swelling    Current Outpatient Prescriptions  Medication Sig Dispense Refill  . Multiple Vitamins-Minerals (HAIR/SKIN/NAILS PO) Take 1 tablet by mouth daily.    . tamoxifen (NOLVADEX) 20 MG tablet Take 1 tablet (20 mg total) by mouth daily. 90 tablet 3   No current facility-administered medications for this visit.    OBJECTIVE: Young African-American woman who appears well Filed Vitals:   01/27/15 0914  BP: 134/96  Pulse: 100  Temp: 98.5 F (36.9 C)  Resp: 18     Body mass index is 23.81 kg/(m^2).    ECOG FS: 0 Filed Weights   01/27/15 0914  Weight: 134 lb 6.4 oz (60.963 kg)   Physical Exam:  Sclerae unicteric, pupils round and equal Oropharynx clear and moist-- no thrush or other lesions No cervical or supraclavicular adenopathy Lungs no rales or rhonchi Heart regular rate and rhythm Abd soft, nontender, positive bowel sounds MSK no focal spinal tenderness, no upper extremity lymphedema Neuro: nonfocal, well oriented, appropriate affect Breasts: Status post bilateral mastectomies with bilateral reconstruction. There is no evidence of local recurrence. Both axillae are benign.    LAB RESULTS: Lab Results  Component Value Date   WBC 5.5 01/27/2015   NEUTROABS 3.5 01/27/2015   HGB 14.1  01/27/2015   HCT 40.9 01/27/2015   MCV 87.4 01/27/2015   PLT 203 01/27/2015      Chemistry      Component Value Date/Time   NA 142 01/27/2015 0826   NA 140 09/24/2013 2121   K 3.5 01/27/2015 0826   K 4.2 09/24/2013 2121   CL 104 09/24/2013 2121   CL 101 11/15/2012 0904   CO2 25 01/27/2015 0826   CO2 20 09/24/2013 2121   BUN 10.3 01/27/2015 0826   BUN 10 09/24/2013 2121   CREATININE 0.8 01/27/2015 0826   CREATININE 0.58 09/24/2013 2121   CREATININE 0.80 05/02/2012 1412      Component Value Date/Time   CALCIUM 8.7 01/27/2015 0826   CALCIUM 8.4 09/24/2013 2121   ALKPHOS 61 01/27/2015  0826   ALKPHOS 46 07/14/2012 2300   AST 15 01/27/2015 0826   AST 14 07/14/2012 2300   ALT 14 01/27/2015 0826   ALT 7 07/14/2012 2300   BILITOT 0.48 01/27/2015 0826   BILITOT 0.3 07/14/2012 2300       STUDIES: No results found.     ASSESSMENT: 44 y.o. BRCA negative [place] woman   (1)  status post right mastectomy and sentinel lymph node sampling 07/14/2012 (with simple left mastectomy)  for a pT2, pN1, stage IIB invasive ductal carcinoma, grade 3, estrogen receptor 100% and progesterone receptor 100% positive, with an MIB-1 of 48%, and HER-2 amplified by CISH with a ratio of 3.02.  (2) adjuvant chemotherapy started 08/17/2012, consisting of carboplatin, docetaxel and trastuzumab given every 3 weeks for 6 cycles, completed 11/30/2012  (3) trastuzumab continued for total of one year, final dose given on 08/30/2013 with good tolerance. Her last echocardiogram on 10/16/13 demonstrated a LVEF of 60-65%.  She was released from cardio-onc clinic by Dr. Aundra Dubin and will f/u with them PRN.    (4)  Status post radiation therapy, completed in early September  (5) started on tamoxifen in mid September 2014  (6) Eczema, previously treated with Elidel twice daily--controlled  (7)  Anxiety, lorazepam 0.5 mg twice a day as needed    (8) s/p bilateral breast reconstruction on 09/24/13 with latissmus  flap and expander placement and on 12/12/13 with implant placement  (9) genetic testing June 2014 through [facility] showed no deleterious mutations in a comprehensive cancer Gene panel  PLAN:  Taniya is now 2-1/2 years out from her definitive surgery with no evidence of disease recurrence. I refilled her tamoxifen for the next year.  We went over the recent data which suggests that particularly with young women continuing antiestrogen for 10 years significantly adds to risk reduction. Accordingly the plan will be for tamoxifen for 5 years, and then assessment for menopausal status. If she is menopausal at that time we would switch to Arimidex.  We also discussed our new survivorship program. She will see our survivorship nurse practitioner in 6 months. She will return to see me again a year from now. At that point we will consider starting once a year visits  She has a good understanding of this plan. She agrees with it. She knows a goal of treatment in her case is cure. She will call with any problems that may develop before her next visit here. Chauncey Cruel, [place] [phone] 01/27/2015

## 2015-01-27 NOTE — Telephone Encounter (Signed)
Appointments made and avs printed for patient and emial to gretchen for bsnp

## 2015-08-21 ENCOUNTER — Telehealth: Payer: Self-pay | Admitting: Nurse Practitioner

## 2015-08-21 NOTE — Telephone Encounter (Signed)
Spoke w/ pt in regards to LTS. Pt is interested and scheduled appt for 3/21 at 8:30am. Pt confirmed time and date. Pt aware pt appt confirmation will be sent.

## 2015-09-02 ENCOUNTER — Encounter: Payer: Self-pay | Admitting: Nurse Practitioner

## 2015-09-02 ENCOUNTER — Ambulatory Visit (HOSPITAL_BASED_OUTPATIENT_CLINIC_OR_DEPARTMENT_OTHER): Payer: Federal, State, Local not specified - PPO | Admitting: Nurse Practitioner

## 2015-09-02 ENCOUNTER — Telehealth: Payer: Self-pay | Admitting: Oncology

## 2015-09-02 VITALS — BP 144/100 | HR 106 | Temp 98.8°F | Resp 18 | Ht 63.0 in | Wt 135.7 lb

## 2015-09-02 DIAGNOSIS — C50211 Malignant neoplasm of upper-inner quadrant of right female breast: Secondary | ICD-10-CM

## 2015-09-02 DIAGNOSIS — M25511 Pain in right shoulder: Secondary | ICD-10-CM | POA: Diagnosis not present

## 2015-09-02 DIAGNOSIS — Z853 Personal history of malignant neoplasm of breast: Secondary | ICD-10-CM | POA: Diagnosis not present

## 2015-09-02 NOTE — Telephone Encounter (Signed)
Gave and printed appt sched and avs for pt for March and Aug

## 2015-09-02 NOTE — Progress Notes (Signed)
CLINIC:  Cancer Survivorship   REASON FOR VISIT:  Routine follow-up post-treatment for history of breast cancer.  BRIEF ONCOLOGIC HISTORY:    Primary cancer of upper inner quadrant of right female breast ([place])   07/13/2012 Clinical Stage Stage IA: T1c N0   07/14/2012 Definitive Surgery Bilateral mastectomy/right SLNB: RIGHT invasive ductal carcinoma, grade 3, ER+, PR +, Her2/neu positive (ratio 3.02), Ki67 48%. DCIS. 1/4 LN positive for malignancy. LEFT: benign   07/14/2012 Pathologic Stage Stage IIB: T2 N1a M0   08/17/2012 - 08/30/2013 Chemotherapy Adjuvant carboplatin, docetaxel, and trastuzumab x 6 cycles (completed 11/30/2012) followed by maintenance trastuzumab to total one year   11/2012 Procedure Comp Cancer Gene panel ([facility]) negative for deleterious mutations    - 02/2013 Radiation Therapy Adjuvant RT to right breast   02/2013 -  Anti-estrogen oral therapy Tamoxifen 20 mg daily   09/24/2013 Surgery Bilateral breast reconstruction with latissmus flap and expander placement   12/12/2013 Surgery Implant placement    INTERVAL HISTORY:  Sherry Barry presents to the [facility] today for ongoing follow up regarding her history of breast cancer. Overall, Sherry Barry reports doing very well since her last visit here to see Dr. Jana Hakim in August 2016. She continues on her tamoxifen and is tolerating this without hot flashes, bleeding, shortness of breath or calf pain / swelling. She is S/P hysterectomy.  She does have vaginal discharge without itching, which she states is about the same. She has had candidasis in the past necessitating treatment with oral fluconazole. She reports continued tightness along her right chest wall which has been ongoing, as well as the presence of some new pain along her right shoulder and neck. She feels that this may be aggravated by the typing she does at work.  She denies accompanying numbness / tingling in her right hand. She has not noted any  change along her reconstructed breast but reports continued "knottiness" in her right axilla which worries her.  She is scheduled to see Dr. Migdalia Dk next month.  She denies any headache, cough, shortness of breath, or bone pain. She reports a good appetite and denies any weight loss.  Her blood pressure is elevated today and she states that happens when she is in the doctor's office.  As above, she denies headache and states she has no eye pain, fatigue or other symptoms. She continues with eczema along her neck but it is well controlled.  REVIEW OF SYSTEMS:  General: Denies fever, chills, unintentional weight loss, or generalized fatigue.  HEENT: Wears glasses. Ringing in ears, stable. Denies visual changes, hearing loss, mouth sores, or difficulty swallowing. Cardiac: Denies palpitations and lower extremity edema.  Respiratory: Denies wheeze or dyspnea on exertion.  Breast: Denies any new nodularity, masses, tenderness, nipple changes, or nipple discharge.  GI: Denies abdominal pain, constipation, diarrhea, nausea, or vomiting.  GU: Denies dysuria, hematuria, vaginal bleeding, vaginal discharge, or vaginal dryness.  Musculoskeletal: Right shoulder pain and right chest wall stiffness as above.  Otherwise, denies joint or bone pain.  Neuro: Denies recent fall or numbness / tingling in her extremities.  Skin: Eczematous rash intermittently, relieved by prescription steroid cream. Otherwise, denies rash, pruritis, or open wounds.  Psych: Denies depression, anxiety, insomnia, or memory loss.   A 14-point review of systems was completed and was negative, except as noted above.   ONCOLOGY TREATMENT TEAM:  1. Surgeon:  Dr. Brantley Stage at [facility]  2. Medical Oncologist: Dr. Jana Hakim 3. Radiation Oncologist: Dr. Valere Dross  4. Plastic Surgeon: Dr. Marla Roe    PAST MEDICAL/SURGICAL HISTORY:  Past Medical History  Diagnosis Date  . Allergy   . Migraine   . Migraine   . History of  migraine     last one about a week ago  . History of chemotherapy     doxetaxel/carboplatin/trastuzumab  . Breast cancer   . Breast cancer     right  . Hx of radiation therapy 01/01/13- 02/15/13    r chest wall, r supraclav/axilla 5040 cGy/28 sessions, scar boost 1000 cGy/5 sessions  . Hypertension     no meds,   urgent care on [place]ana   Past Surgical History  Procedure Laterality Date  . Simple mastectomy with axillary sentinel node biopsy  07/14/2012    Procedure: SIMPLE MASTECTOMY WITH AXILLARY SENTINEL NODE BIOPSY;  Surgeon: Joyice Faster. Cornett, MD;  Location: [place];  Service: General;  Laterality: Right;  Bilateral simple mastectomy with port and right sebtibel lymph node mapping  . Simple mastectomy with axillary sentinel node biopsy  07/14/2012    Procedure: SIMPLE MASTECTOMY;  Surgeon: Joyice Faster. Cornett, MD;  Location: [place];  Service: General;  Laterality: Left;  . Portacath placement  07/14/2012    Procedure: INSERTION PORT-A-CATH;  Surgeon: Joyice Faster. Cornett, MD;  Location: [place];  Service: General;  Laterality: Left;  . Abdominal hysterectomy      no salpingo-oophorectomy  . Breast reconstruction with placement of tissue expander and flex hd (acellular hydrated dermis) Left 09/24/2013  . Reconstruction breast w/ latissimus dorsi flap Right 09/24/2013    "& tissue expander placement"  . Latissimus flap to breast Right 09/24/2013    Procedure: RIGHT LATISSMUS MYOCUTAEIOUS MUSCLE FLAP AND PLACEMENT OF TISSUE [name];  Surgeon: Theodoro Kos, DO;  Location: [place];  Service: Plastics;  Laterality: Right;  . Tissue expander placement Left 09/24/2013    Procedure: PLACEMENT OF TISSUE EXPANDER AND FLEX HD TO LEFT BREAST;  Surgeon: Theodoro Kos, DO;  Location: [place];  Service: Plastics;  Laterality: Left;  . Removal of bilateral tissue expanders with placement of bilateral breast implants Bilateral 12/12/2013    Procedure: REMOVAL OF BILATERAL TISSUE EXPANDERS WITH PLACEMENT OF BILATERAL BREAST  IMPLANTS/BILATERAL CAPSULECTOMIES WITH  LIPOFILLING FAT GRAFTING;  Surgeon: Theodoro Kos, DO;  Location: [place];  Service: Plastics;  Laterality: Bilateral;  . Liposuction with lipofilling Bilateral 12/12/2013    Procedure: LIPOSUCTION WITH LIPOFILLING;  Surgeon: Theodoro Kos, DO;  Location: [place];  Service: Plastics;  Laterality: Bilateral;  . Port-a-cath removal Left 12/12/2013    Procedure: REMOVAL PORT-A-CATH;  Surgeon: Theodoro Kos, DO;  Location: [place];  Service: Plastics;  Laterality: Left;     ALLERGIES:  Allergies  Allergen Reactions  . Codeine Itching  . Latex Swelling     CURRENT MEDICATIONS:  Current Outpatient Prescriptions on File Prior to Visit  Medication Sig Dispense Refill  . tamoxifen (NOLVADEX) 20 MG tablet Take 1 tablet (20 mg total) by mouth daily. 90 tablet 3  . Multiple Vitamins-Minerals (HAIR/SKIN/NAILS PO) Take 1 tablet by mouth daily. Reported on 09/02/2015     No current facility-administered medications on file prior to visit.     ONCOLOGIC FAMILY HISTORY:  Family History  Problem Relation Age of Onset  . Hypertension Mother   . Alcohol abuse Mother   . Heart disease Maternal Grandmother   . Stroke Maternal Grandfather   . Colon cancer Maternal Aunt 29    alive at 50  .  Brain cancer Maternal Uncle 58    and lymphoma in early 9s; deceased  . Brain cancer Maternal Uncle 60    deceased  . Pancreatic cancer Maternal Uncle 45    alive at 37  . Breast cancer Maternal Aunt     great aunt through mat GF; dx at ? age  . Breast cancer Cousin 56    mat 1st cousin once removed through mat GF ; deceased     GENETIC COUNSELING/TESTING: Yes, performed 11/2012 (Comprehensive Cancer Panel) and negative for deleterious mutations.  SOCIAL HISTORY:  Sherry Barry is married and lives with her family in [place], [place].  She has 2 children. Sherry Barry is currently working  in the [employer] for the [place] [employer].  She denies any current or history of tobacco or illicit drug use.  She uses alcohol occasionally.  HEALTH MAINTENANCE: Colonoscopy: N/A due to age PAP: S/P hysterectomy Bone density: N/A due to age  PHYSICAL EXAMINATION:  Vital Signs: Filed Vitals:   09/02/15 0827 09/02/15 0850  BP: 134/96 144/100  Pulse: 106   Temp: 98.8 F (37.1 C)   Resp: 18    Weight: 135.7 (stable) ECOG performance status: 0 General: Well-nourished, well-appearing female in no acute distress.  She is unaccompanied in clinic today.   HEENT: Head is atraumatic and normocephalic.  Pupils equal and reactive to light and accomodation. Conjunctivae clear without exudate.  Sclerae anicteric. Oral mucosa is pink, moist, and intact without lesions.  Oropharynx is pink without lesions or erythema.  Lymph: No cervical, supraclavicular, infraclavicular, or left axillary lymphadenopathy noted on palpation. Right axilla remains full with generalized lumpiness.  No discrete lymphadenopathy. Cardiovascular: Regular rate and rhythm without murmurs, rubs, or gallops. Respiratory: Clear to auscultation bilaterally. Chest expansion symmetric without accessory muscle use on inspiration or expiration.  Breast: Bilateral reconstructed breasts with excellent cosmesis.  No mass or nodularity. GI: Abdomen soft and round. No tenderness to palpation. Bowel sounds normoactive in 4 quadrants. No hepatosplenomegaly.   GU: Deferred.  Musculoskeletal: Muscle strength 5/5 in all extremities.   Neuro: No focal deficits. Steady gait.  Psych: Mood and affect normal and appropriate for situation.  Extremities: No edema, cyanosis, or clubbing.  Skin: Warm and dry. No open lesions noted.   LABORATORY DATA:  No results found for this or any previous visit (from the past 2160 hour(s)).     ASSESSMENT AND PLAN:   1. History of breast cancer: Stage IIB invasive ductal carcinoma of the  right breast (06/2012), ER positive, PR positive, HER2/neu positive, S/P bilateral mastectomies (06/2012) with benign findings on the right, 1/4 LN positive for metastatic disease in right axilla at time of surgery, S/P adjuvant carboplatin, docetaxel, and trastuzumab x 6 (11/2012) followed by maintenance trastuzumab to complete one year of anti HER2 therapy (08/2013), followed by adjuvant radiation to the right breast (02/2013) now on adjuvant endocrine therapy with tamoxifen.  I have reviewed the recommendations for ongoing surveillance with her and she will follow-up with her medical oncologist,  Dr. Jana Hakim, in August 2017 with history and physical exam per surveillance protocol.  She will continue her anti-estrogen therapy with tamoxifen at this time.  We have discussed that we will likely check her hormone levels at some point in the future to determine if she is menopausal and consider changing her to aromatase inhibitor therapy if that is the case. Regarding her right axilla, I do not believe that this "knottiness" represents malignancy, but we will check  a right axillary ultrasound to evaluate. Further disposition pending those results. She was instructed to make [place] aware if she notes any change within her reconstructed breast, any new symptoms such as pain, shortness of breath, weight loss, or fatigue.  She will also report any new or increased side effects of the endocrine therapy or any difficulties with it. Sherry Barry was encouraged to contact us with any vaginal bleeding or chest / calf pain while taking Tamoxifen. Other side effects of Tamoxifen were again reviewed with her as well.    2. Right shoulder pain: I believe that Sherry Barry's symptoms are consistent with muscle strain and likely aggravated by repetitive motions from her job.  I have asked her to continue to perform her shoulder exercises and use good body mechanics when on the computer / while at work.  If the pain does not  improve, we will investigate further.  3. Elevated blood pressure: Sherry Barry reports that her blood pressure is often elevated when in the doctor's office but normal at home.  She is asymptomatic today. She has a blood pressure monitor at home and will check her readings, notifying us of those results.    4. Cancer screening:  Due to Sherry Barry's history and her age, she should receive screening for skin cancers and colon cancer (beginning at age 72). She is S/P hysterectomy. The information and recommendations were shared with the patient and in her written after visit summary.  5. Health maintenance and wellness promotion:Sherry Barry and I discussed recommendations to maximize nutrition and minimize recurrence, such as increased intake of fruits, vegetables, lean proteins, and minimizing the intake of red meats and processed foods.  She was also encouraged to engage in moderate to vigorous exercise for 30 minutes per day most days of the week. She was instructed to limit her alcohol consumption and continue to abstain from tobacco use.    A total of 30 minutes of face-to-face time was spent with this patient with greater than 50% of that time in counseling and care-coordination.   Sylvan Cheese, NP  Survivorship Program [facility] [phone]   Note: PRIMARY CARE PROVIDER Lynne Logan, MD [phone]

## 2015-09-02 NOTE — Patient Instructions (Signed)
Thank you for coming in today!  As we discussed, please continue to perform your self breast exam and report any changes along your reconstruction. If you note any new symptoms (please see below), be sure to notify us ASAP.  I have entered orders to check an ultrasound of your right axilla / underarm and will call you with those results.  Please keep your follow up appointment with Dr. Jana Hakim in August 2017.  Looking forward to working with you in the future!  Let us know if you have any questions!  Symptoms to Watch for and Report to Your Provider  . Return of the cancer symptoms you had before - such as a lump or change in the area of your incision (or changes in reconstructed breast, if reconstruction completed) . New or unusual pain that seems unrelated to an injury and does not go away, including back pain or bone pain . Weight loss without trying/intending . Unexplained bleeding . A rash or allergic reaction, such as swelling, severe itching or wheezing . Chills or fevers . Persistent headaches . Shortness of breath or difficulty breathing . Bloody stools or blood in your urine . Nausea, vomiting, diarrhea, loss of appetite, or trouble swallowing . A cough that does not go away . Abdominal pain . Swelling in your arms or legs . Fractures . Hot flashes or other menopausal symptoms . Any other signs mentioned by your doctor or nurse or any unusual symptoms                 that you just can't explain   NOTE: Just because you have certain symptoms, it doesn't mean the cancer has come back or you have a new cancer. Symptoms can be due to other problems that need to be addressed.  It is important to watch for these symptoms and report them to your provider so you can be medically evaluated for any of these concerns!    Living a Life of Wellness After Cancer:  *Note: Please consult your health care provider before using any medications, supplements, over-the-counter products, or other  interventions.  Also, please consult your primary care provider before you begin any lifestyle program (diet, exercise, etc.).  Your safety is our top priority and we want to make sure you continue to live a long and healthy life!    Healthy Lifestyle Recommendations  As a cancer survivor, it is important develop a lifelong commitment to a healthy lifestyle. A healthy lifestyle can prevent cancer from returning as well as prevent other diseases like heart disease, diabetes and high blood pressure.  These are some things that you can do to have a healthy lifestyle:  [name] Maintain a healthy weight.  . Exercise daily per your doctor's orders. . Eat a balanced diet high in fruits, vegetables, bran, and fiber. Limit intake of red meat          and processed foods.  . Limit how much alcohol you consume, if at all. Ali Lowe regular bone mineral density testing for osteoporosis.  . Talk to your doctor about cardiovascular disease or "heart disease" screening. . Stop smoking (if you smoke). . Know your family history. . Be mindful of your emotional, social, and spiritual needs. . Meet regularly with a Primary Care Provider (PCP). Find a PCP if you do not             already have one. . Talk to your doctor about regular cancer screening including screening for colon  cancer, GYN cancers, and skin cancer.

## 2015-09-04 ENCOUNTER — Ambulatory Visit
Admission: RE | Admit: 2015-09-04 | Discharge: 2015-09-04 | Disposition: A | Discharge status: Home / Self Care | Payer: Federal, State, Local not specified - PPO | Source: Ambulatory Visit | Attending: Nurse Practitioner | Admitting: Nurse Practitioner

## 2015-09-04 DIAGNOSIS — C50211 Malignant neoplasm of upper-inner quadrant of right female breast: Secondary | ICD-10-CM

## 2015-09-09 DIAGNOSIS — N651 Disproportion of reconstructed breast: Secondary | ICD-10-CM | POA: Insufficient documentation

## 2015-12-29 ENCOUNTER — Encounter (HOSPITAL_BASED_OUTPATIENT_CLINIC_OR_DEPARTMENT_OTHER): Payer: Self-pay | Admitting: *Deleted

## 2016-01-08 ENCOUNTER — Ambulatory Visit (HOSPITAL_BASED_OUTPATIENT_CLINIC_OR_DEPARTMENT_OTHER): Admit: 2016-01-08 | Payer: Federal, State, Local not specified - PPO | Admitting: Plastic Surgery

## 2016-01-08 SURGERY — LIPOSUCTION, WITH FAT TRANSFER
Anesthesia: General | Laterality: Left

## 2016-01-20 ENCOUNTER — Other Ambulatory Visit (HOSPITAL_BASED_OUTPATIENT_CLINIC_OR_DEPARTMENT_OTHER): Payer: Federal, State, Local not specified - PPO

## 2016-01-20 DIAGNOSIS — Z853 Personal history of malignant neoplasm of breast: Secondary | ICD-10-CM

## 2016-01-20 DIAGNOSIS — C50211 Malignant neoplasm of upper-inner quadrant of right female breast: Secondary | ICD-10-CM

## 2016-01-20 LAB — CBC WITH DIFFERENTIAL/PLATELET
BASO%: 0.4 % (ref 0.0–2.0)
Basophils Absolute: 0 10*3/uL (ref 0.0–0.1)
EOS%: 2.2 % (ref 0.0–7.0)
Eosinophils Absolute: 0.1 10*3/uL (ref 0.0–0.5)
HEMATOCRIT: 41.3 % (ref 34.8–46.6)
HEMOGLOBIN: 14.2 g/dL (ref 11.6–15.9)
LYMPH#: 1.8 10*3/uL (ref 0.9–3.3)
LYMPH%: 32.1 % (ref 14.0–49.7)
MCH: 29.5 pg (ref 25.1–34.0)
MCHC: 34.4 g/dL (ref 31.5–36.0)
MCV: 85.9 fL (ref 79.5–101.0)
MONO#: 0.3 10*3/uL (ref 0.1–0.9)
MONO%: 5.8 % (ref 0.0–14.0)
NEUT%: 59.5 % (ref 38.4–76.8)
NEUTROS ABS: 3.3 10*3/uL (ref 1.5–6.5)
Platelets: 200 10*3/uL (ref 145–400)
RBC: 4.81 10*6/uL (ref 3.70–5.45)
RDW: 12.3 % (ref 11.2–14.5)
WBC: 5.5 10*3/uL (ref 3.9–10.3)
nRBC: 0 % (ref 0–0)

## 2016-01-20 LAB — COMPREHENSIVE METABOLIC PANEL
ALBUMIN: 3.7 g/dL (ref 3.5–5.0)
ALK PHOS: 70 U/L (ref 40–150)
ALT: 15 U/L (ref 0–55)
AST: 19 U/L (ref 5–34)
Anion Gap: 11 mEq/L (ref 3–11)
BUN: 13.6 mg/dL (ref 7.0–26.0)
CALCIUM: 9.4 mg/dL (ref 8.4–10.4)
CO2: 24 mEq/L (ref 22–29)
CREATININE: 0.8 mg/dL (ref 0.6–1.1)
Chloride: 109 mEq/L (ref 98–109)
EGFR: >90 mL/min/{1.73_m2} (ref >90)
Glucose: 67 mg/dl — ABNORMAL LOW (ref 70–140)
Potassium: 3.7 mEq/L (ref 3.5–5.1)
Sodium: 144 mEq/L (ref 136–145)
TOTAL PROTEIN: 7 g/dL (ref 6.4–8.3)
Total Bilirubin: 0.5 mg/dL (ref 0.20–1.20)

## 2016-01-21 LAB — ESTRADIOL

## 2016-01-21 LAB — FOLLICLE STIMULATING HORMONE: FSH: 22 m[IU]/mL

## 2016-01-27 ENCOUNTER — Telehealth: Payer: Self-pay | Admitting: Oncology

## 2016-01-27 ENCOUNTER — Ambulatory Visit (HOSPITAL_BASED_OUTPATIENT_CLINIC_OR_DEPARTMENT_OTHER): Payer: Federal, State, Local not specified - PPO | Admitting: Oncology

## 2016-01-27 VITALS — BP 132/86 | HR 115 | Temp 99.0°F | Resp 18 | Ht 62.0 in | Wt 138.6 lb

## 2016-01-27 DIAGNOSIS — Z170 Estrogen receptor positive status [ER+]: Secondary | ICD-10-CM

## 2016-01-27 DIAGNOSIS — C50211 Malignant neoplasm of upper-inner quadrant of right female breast: Secondary | ICD-10-CM | POA: Diagnosis not present

## 2016-01-27 DIAGNOSIS — Z79810 Long term (current) use of selective estrogen receptor modulators (SERMs): Secondary | ICD-10-CM

## 2016-01-27 MED ORDER — FLUOCINONIDE 0.05 % EX GEL: 1.0000 "application " | Freq: Two times a day (BID) | CUTANEOUS | 0 refills | Status: DC

## 2016-01-27 MED ORDER — GABAPENTIN 300 MG PO CAPS: 300.0000 mg | ORAL_CAPSULE | Freq: Every day | ORAL | 4 refills | Status: DC

## 2016-01-27 MED ORDER — TAMOXIFEN CITRATE 20 MG PO TABS: 20.0000 mg | ORAL_TABLET | Freq: Every day | ORAL | 3 refills | Status: DC

## 2016-01-27 NOTE — Telephone Encounter (Signed)
appt made and avs printed °

## 2016-01-27 NOTE — Progress Notes (Signed)
ID: Sherry Barry   DOB: [birth year]  MR#: [id number]  ZLD#:[id number]  PCP: Lynne Logan, MD GYN: Everett Graff. MD SU: Erroll Luna. 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 At that time a digital bilateal diagnostic mammogram showed a slight decrease in size of the benign fibroadenoma in the left breast compared to 2010. The same mammogram, however, also refilled pleomorphic calcifications extending over 9 cm in the inner third of the right breast. There were no suspicious calcifications in the left breast. On exam, Dr. Purcell Nails was able to palpate a small freely mobile mass in the lower inner left breast, but was unable to palpate any abnormalities in the right breast other than some focal thickening.  An subsequent ultrasound of the right breast on 06/16/2012 showed multiple hypoechoic masses throughout the upper quadrants of the right breast. The palpable mass in the 1:00 location corresponded with an irregular hypoechoic mass measuring 1.7 x 1.4 cm. There was also a discrete hypoechoic lobulated mass at the 11:00 position measuring 2.1 x 1.1 x 2.1 cm. The third lesion identified at the 11:00 location, 3 cm from the nipple, measured 1.0 x 0.8 x 0.9 cm.   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  Bilateral breast MRI 06/23/2012 showed an enhancing mass in the upper central right breast measuring 1.8  cm, together with clumped nodular enhancement extending for a total area of 11.4 cm. In the upper outer quadrant of this breast there were 3 discrete irregular masses measuring up to 3 cm. This correlated well with the ultrasound findings previously. In the left breast there was a large area of clumped nodular enhancement measuring up to 8.9 cm. In the lower central portion there was a 1.7 cm circumscribed nodule consistent with a known fibroadenoma. There weas no axillary or internal mammary adenopathy noted on either side.   The 8.9 cm area of abnormality in the left breast was biopsied on 06/30/2012 showing fibrocystic changes with adenosis and calcifications, pseudoangiomatous stromal hyperplasia, but no evidence of malignancy.  The patient underwent bilateral mastectomies under the care of Dr. Brantley Stage on 07/14/2012. The left mastectomy revealed benign breast tissue and was negative for malignancy. 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 Lymphovascular invasion was identified; #2) a ductal carcinoma in situ, grade 3, with comedo necrosis.  Surgical margins were negative. 4 lymph nodes were examined, one positive for metastatic mammary carcinoma, a second positive for isolated tumor cells. pT2, pN1a  Subsequent history is detailed below.  INTERVAL HISTORY: Sherry Barry returns today for follow up of her estrogen receptor positive breast cancer. She continues on tamoxifen. She generally tolerates this well, except that she is getting some nighttime hot flashes which to wake her up. Recall she status post hysterectomy.  Since her last visit here she graduated from [employer] with  the business degree and got a promotion at work. Her children, aged 57 and 72, are doing great. She goes to the gym daily.  REVIEW OF SYSTEMS: The only problem she reports a little bit of eczema, for which she uses Lidex cream,  which has run out. A detailed review of systems today was otherwise negative except as noted.  PAST MEDICAL HISTORY: Past Medical History:  Diagnosis Date  . Allergy   . Breast cancer ([place])   . Breast cancer ([place])    right  . History of chemotherapy    doxetaxel/carboplatin/trastuzumab  . History of migraine    last one about a week ago  . Hx of radiation therapy 01/01/13- 02/15/13   r chest wall, r supraclav/axilla 5040 cGy/28 sessions, scar boost 1000 cGy/5 sessions  . Hypertension    no meds,   urgent care on [place]  . Migraine   . Migraine     PAST SURGICAL HISTORY: Past Surgical History:  Procedure Laterality Date  . ABDOMINAL HYSTERECTOMY     no salpingo-oophorectomy 2009  . BREAST RECONSTRUCTION WITH PLACEMENT OF TISSUE EXPANDER AND FLEX HD (ACELLULAR HYDRATED DERMIS) Left 09/24/2013  . LATISSIMUS FLAP TO BREAST Right 09/24/2013   Procedure: RIGHT LATISSMUS MYOCUTAEIOUS MUSCLE FLAP AND PLACEMENT OF TISSUE [name];  Surgeon: Theodoro Kos, DO;  Location: [facility];  Service: Plastics;  Laterality: Right;  . LIPOSUCTION WITH LIPOFILLING Bilateral 12/12/2013   Procedure: LIPOSUCTION WITH LIPOFILLING;  Surgeon: Theodoro Kos, DO;  Location: [facility];  Service: Plastics;  Laterality: Bilateral;  . PORT-A-CATH REMOVAL Left 12/12/2013   Procedure: REMOVAL PORT-A-CATH;  Surgeon: Theodoro Kos, DO;  Location: [facility];  Service: Plastics;  Laterality: Left;  . PORTACATH PLACEMENT  07/14/2012   Procedure: INSERTION PORT-A-CATH;  Surgeon: Joyice Faster. Cornett, MD;  Location: [facility];  Service: General;  Laterality: Left;  . RECONSTRUCTION BREAST W/ LATISSIMUS DORSI FLAP Right 09/24/2013   "& tissue expander placement"  . REMOVAL OF BILATERAL TISSUE EXPANDERS WITH PLACEMENT OF BILATERAL BREAST IMPLANTS Bilateral 12/12/2013   Procedure: REMOVAL OF BILATERAL TISSUE EXPANDERS WITH PLACEMENT OF BILATERAL BREAST IMPLANTS/BILATERAL CAPSULECTOMIES WITH  LIPOFILLING FAT  GRAFTING;  Surgeon: Theodoro Kos, DO;  Location: [facility];  Service: Plastics;  Laterality: Bilateral;  . SIMPLE MASTECTOMY WITH AXILLARY SENTINEL NODE BIOPSY  07/14/2012   Procedure: SIMPLE MASTECTOMY WITH AXILLARY SENTINEL NODE BIOPSY;  Surgeon: Joyice Faster. Cornett, MD;  Location: [facility];  Service: General;  Laterality: Right;  Bilateral simple mastectomy with port and right sebtibel lymph node mapping  . SIMPLE MASTECTOMY WITH AXILLARY SENTINEL NODE BIOPSY  07/14/2012   Procedure: SIMPLE MASTECTOMY;  Surgeon: Joyice Faster. Cornett, MD;  Location: [facility];  Service: General;  Laterality: Left;  . TISSUE EXPANDER PLACEMENT Left 09/24/2013   Procedure: PLACEMENT OF TISSUE EXPANDER AND FLEX HD TO LEFT BREAST;  Surgeon: Theodoro Kos, DO;  Location: [facility];  Service: Plastics;  Laterality: Left;    FAMILY HISTORY Family History  Problem Relation Age of Onset  . Hypertension Mother   . Alcohol abuse Mother   . Heart disease Maternal Grandmother   . Stroke Maternal Grandfather   . Colon cancer Maternal Aunt 36    alive at 3  . Brain cancer Maternal Uncle 76    and lymphoma in early 61s; deceased  . Brain cancer Maternal Uncle 60    deceased  . Pancreatic cancer Maternal Uncle 45    alive at 83  . Breast cancer Maternal  Aunt     great aunt through mat GF; dx at ? age  . Breast cancer Cousin 24    mat 1st cousin once removed through mat GF ; deceased   the patient's father died in an automobile accident in his early 33s. The patient's mother is living, currently age 37. The patient has one brother and one sister. There is no family history of breast or ovarian cancer, although there are maternal uncles with brain, colon and lymph node cancers.  GYNECOLOGIC HISTORY:  (Updated 09/12/2013) Menarche age 89, first live birth age 71, she is [place] P2. She stopped having periods with her hysterectomy in March of 2009. She still has her ovaries in place.  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 Her husband Harless Nakayama works for a company in the [place] doing testing. Their children Jaden (11) and Bryson (6) at home.   ADVANCED DIRECTIVES: not in place  HEALTH MAINTENANCE:  (Updated 09/12/2013) Social History  Substance Use Topics  . Smoking status: Never Smoker  . Smokeless tobacco: Never Used  . Alcohol use Yes     Comment: occasional    Mammogram: s/p bilateral mastectomy Colonoscopy: n/a Bone Density Scan: n/a Pap Smear: s/p TAH  Allergies  Allergen Reactions  . Codeine Itching  . Latex Hives    Current Outpatient Prescriptions  Medication Sig Dispense Refill  . fluocinonide gel (LIDEX) 5.72 % Apply 1 application topically 2 (two) times daily. 60 g 0  . gabapentin (NEURONTIN) 300 MG capsule Take 1 capsule (300 mg total) by mouth at bedtime. 90 capsule 4  . Multiple Vitamins-Minerals (HAIR/SKIN/NAILS PO) Take 1 tablet by mouth daily. Reported on 09/02/2015    . tamoxifen (NOLVADEX) 20 MG tablet Take 1 tablet (20 mg total) by mouth daily. 90 tablet 3   No current facility-administered medications for this visit.     OBJECTIVE: Young African-American woman In no acute distress Vitals:   01/27/16 0849  BP: 132/86  Pulse: (!) 115  Resp: 18  Temp: 99 F (37.2 C)     Body mass index is 25.35 kg/m.    ECOG FS: 0 Filed Weights   01/27/16 0849  Weight: 138 lb 9.6 oz (62.9 kg)   Physical Exam:  Sclerae unicteric, EOMs intact Oropharynx clear and moist No cervical or supraclavicular adenopathy Lungs no rales or rhonchi Heart regular rate and rhythm Abd soft, nontender, positive bowel sounds MSK no focal spinal tenderness, no upper extremity lymphedema Neuro: nonfocal, well oriented, appropriate affect Breasts: Status post bilateral mastectomies with bilateral reconstruction. The cosmetic result is excellent. There is no evidence of chest wall recurrence.  Both axillae are benign.   LAB RESULTS: Lab Results  Component Value Date   WBC 5.5 01/20/2016   NEUTROABS 3.3 01/20/2016   HGB 14.2 01/20/2016   HCT 41.3 01/20/2016   MCV 85.9 01/20/2016   PLT 200 01/20/2016      Chemistry      Component Value Date/Time   NA 144 01/20/2016 0802   K 3.7 01/20/2016 0802   CL 104 09/24/2013 2121   CL 101 11/15/2012 0904   CO2 24 01/20/2016 0802   BUN 13.6 01/20/2016 0802   CREATININE 0.8 01/20/2016 0802      Component Value Date/Time   CALCIUM 9.4 01/20/2016 0802   ALKPHOS 70 01/20/2016 0802   AST 19 01/20/2016 0802   ALT 15 01/20/2016 0802   BILITOT 0.50 01/20/2016  0802       STUDIES: No results found.     ASSESSMENT: 45 y.o. BRCA negative [place] woman   (1)  status post right mastectomy and sentinel lymph node sampling 07/14/2012 (with simple left mastectomy)  for a right breast upper inner quadrant  pT2, pN1, stage IIB invasive ductal carcinoma, grade 3, estrogen receptor 100% and progesterone receptor 100% positive, with an MIB-1 of 48%, and HER-2 amplified by CISH with a ratio of 3.02.  (2) adjuvant chemotherapy started 08/17/2012, consisting of carboplatin, docetaxel and trastuzumab given every 3 weeks for 6 cycles, completed 11/30/2012  (3) trastuzumab continued for total of one year, final dose given on 08/30/2013 with good tolerance. Her last echocardiogram on 10/16/13 demonstrated a LVEF of 60-65%.  She was released from cardio-onc clinic by Dr. Aundra Dubin and will f/u with them PRN.    (4)  Status post radiation therapy, completed in early September  (5) started on tamoxifen in mid September 2014  (a) status post remote hysterectomy without salpingo-oophorectomy  (6) Eczema, previously treated with Elidel twice daily--controlled  (7)  Anxiety, lorazepam 0.5 mg twice a day as needed    (8) s/p bilateral breast reconstruction on 09/24/13 with latissmus flap and expander placement and on 12/12/13 with implant placement  (9)  genetic testing June 2014 through [facility] showed no deleterious mutations in a comprehensive cancer Gene panel  PLAN:  Jema is now 3-1/2 years out from definitive surgery for her breast cancer with no evidence of disease recurrence. This is very favorable.  She is tolerating tamoxifen well. The plan is to continue on anti-estrogens for a total of 10 years.  While her estradiol is low, her FSH is not elevated. We are going to continue the tamoxifen at least another year and a half and then consider switching to an aromatase inhibitor.  She is having some hot flashes at night and we are going to try gabapentin at bedtime. She has a good understanding of the possible toxicities, side effects and complications of this agent.  She also requested I refill her Lidex cream, which I was glad to do for her.  She is going to return to see me in one year. She knows to call for any problems that may develop before her next visit. Chauncey Cruel, [place] [phone] 01/27/2016

## 2016-06-12 DIAGNOSIS — K080 Exfoliation of teeth due to systemic causes: Secondary | ICD-10-CM | POA: Diagnosis not present

## 2016-06-18 ENCOUNTER — Other Ambulatory Visit: Payer: Self-pay | Admitting: Nurse Practitioner

## 2016-07-09 DIAGNOSIS — K080 Exfoliation of teeth due to systemic causes: Secondary | ICD-10-CM | POA: Diagnosis not present

## 2016-08-02 DIAGNOSIS — Z9012 Acquired absence of left breast and nipple: Secondary | ICD-10-CM | POA: Diagnosis not present

## 2016-08-02 DIAGNOSIS — C50911 Malignant neoplasm of unspecified site of right female breast: Secondary | ICD-10-CM | POA: Diagnosis not present

## 2016-08-03 ENCOUNTER — Telehealth: Payer: Federal, State, Local not specified - PPO | Admitting: Nurse Practitioner

## 2016-08-03 DIAGNOSIS — J111 Influenza due to unidentified influenza virus with other respiratory manifestations: Secondary | ICD-10-CM

## 2016-08-03 MED ORDER — OSELTAMIVIR PHOSPHATE 75 MG PO CAPS: 75.0000 mg | ORAL_CAPSULE | Freq: Two times a day (BID) | ORAL | 0 refills | Status: DC

## 2016-08-03 NOTE — Progress Notes (Signed)

## 2016-09-03 DIAGNOSIS — C50911 Malignant neoplasm of unspecified site of right female breast: Secondary | ICD-10-CM | POA: Diagnosis not present

## 2016-12-25 DIAGNOSIS — K080 Exfoliation of teeth due to systemic causes: Secondary | ICD-10-CM | POA: Diagnosis not present

## 2017-01-17 ENCOUNTER — Telehealth: Payer: Self-pay

## 2017-01-17 NOTE — Telephone Encounter (Signed)
Spoke with patient and she is aware of her new f/u appt being r/s from [facility] day  Sherry Barry

## 2017-01-19 ENCOUNTER — Other Ambulatory Visit: Payer: Federal, State, Local not specified - PPO

## 2017-01-26 ENCOUNTER — Ambulatory Visit: Payer: Federal, State, Local not specified - PPO | Admitting: Oncology

## 2017-02-15 ENCOUNTER — Other Ambulatory Visit (HOSPITAL_BASED_OUTPATIENT_CLINIC_OR_DEPARTMENT_OTHER): Payer: Federal, State, Local not specified - PPO

## 2017-02-15 DIAGNOSIS — C50211 Malignant neoplasm of upper-inner quadrant of right female breast: Secondary | ICD-10-CM | POA: Diagnosis not present

## 2017-02-15 LAB — CBC WITH DIFFERENTIAL/PLATELET
BASO%: 0.2 % (ref 0.0–2.0)
BASOS ABS: 0 10*3/uL (ref 0.0–0.1)
EOS ABS: 0.1 10*3/uL (ref 0.0–0.5)
EOS%: 1.6 % (ref 0.0–7.0)
HCT: 42.9 % (ref 34.8–46.6)
HEMOGLOBIN: 14.7 g/dL (ref 11.6–15.9)
LYMPH%: 27.1 % (ref 14.0–49.7)
MCH: 29.7 pg (ref 25.1–34.0)
MCHC: 34.3 g/dL (ref 31.5–36.0)
MCV: 86.7 fL (ref 79.5–101.0)
MONO#: 0.3 10*3/uL (ref 0.1–0.9)
MONO%: 6.2 % (ref 0.0–14.0)
NEUT#: 3.4 10*3/uL (ref 1.5–6.5)
NEUT%: 64.9 % (ref 38.4–76.8)
PLATELETS: 177 10*3/uL (ref 145–400)
RBC: 4.95 10*6/uL (ref 3.70–5.45)
RDW: 12.6 % (ref 11.2–14.5)
WBC: 5.2 10*3/uL (ref 3.9–10.3)
lymph#: 1.4 10*3/uL (ref 0.9–3.3)

## 2017-02-15 LAB — COMPREHENSIVE METABOLIC PANEL
ALBUMIN: 3.6 g/dL (ref 3.5–5.0)
ALK PHOS: 63 U/L (ref 40–150)
ALT: 13 U/L (ref 0–55)
ANION GAP: 7 meq/L (ref 3–11)
AST: 16 U/L (ref 5–34)
BILIRUBIN TOTAL: 0.69 mg/dL (ref 0.20–1.20)
BUN: 12.5 mg/dL (ref 7.0–26.0)
CO2: 25 mEq/L (ref 22–29)
Calcium: 9.3 mg/dL (ref 8.4–10.4)
Chloride: 109 mEq/L (ref 98–109)
Creatinine: 0.9 mg/dL (ref 0.6–1.1)
GLUCOSE: 87 mg/dL (ref 70–140)
POTASSIUM: 3.9 meq/L (ref 3.5–5.1)
SODIUM: 141 meq/L (ref 136–145)
Total Protein: 7 g/dL (ref 6.4–8.3)

## 2017-02-22 ENCOUNTER — Other Ambulatory Visit: Payer: Self-pay | Admitting: Oncology

## 2017-02-22 ENCOUNTER — Ambulatory Visit (HOSPITAL_BASED_OUTPATIENT_CLINIC_OR_DEPARTMENT_OTHER): Payer: Federal, State, Local not specified - PPO | Admitting: Oncology

## 2017-02-22 ENCOUNTER — Encounter: Payer: Self-pay | Admitting: Oncology

## 2017-02-22 VITALS — BP 129/104 | HR 100 | Temp 98.9°F | Resp 18 | Ht 62.0 in | Wt 139.8 lb

## 2017-02-22 DIAGNOSIS — Z170 Estrogen receptor positive status [ER+]: Secondary | ICD-10-CM

## 2017-02-22 DIAGNOSIS — C50211 Malignant neoplasm of upper-inner quadrant of right female breast: Secondary | ICD-10-CM

## 2017-02-22 DIAGNOSIS — Z853 Personal history of malignant neoplasm of breast: Secondary | ICD-10-CM

## 2017-02-22 DIAGNOSIS — Z79810 Long term (current) use of selective estrogen receptor modulators (SERMs): Secondary | ICD-10-CM | POA: Diagnosis not present

## 2017-02-22 NOTE — Progress Notes (Signed)
ID: Sherry Barry   DOB: November 24, 1970  MR#: [id number]  IDP#:[id number]  PCP: Donald Prose, MD GYN: Everett Graff. MD SU: Erroll Luna. 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 At that time a digital bilateal diagnostic mammogram showed a slight decrease in size of the benign fibroadenoma in the left breast compared to 2010. The same mammogram, however, also refilled pleomorphic calcifications extending over 9 cm in the inner third of the right breast. There were no suspicious calcifications in the left breast. On exam, Dr. Purcell Nails was able to palpate a small freely mobile mass in the lower inner left breast, but was unable to palpate any abnormalities in the right breast other than some focal thickening.  An subsequent ultrasound of the right breast on 06/16/2012 showed multiple hypoechoic masses throughout the upper quadrants of the right breast. The palpable mass in the 1:00 location corresponded with an irregular hypoechoic mass measuring 1.7 x 1.4 cm. There was also a discrete hypoechoic lobulated mass at the 11:00 position measuring 2.1 x 1.1 x 2.1 cm. The third lesion identified at the 11:00 location, 3 cm from the nipple, measured 1.0 x 0.8 x 0.9 cm.   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  Bilateral breast MRI 06/23/2012 showed an enhancing mass in the upper central right breast measuring 1.8  cm, together with clumped nodular enhancement extending for a total area of 11.4 cm. In the upper outer quadrant of this breast there were 3 discrete irregular masses measuring up to 3 cm. This correlated well with the ultrasound findings previously. In the left breast there was a large area of clumped nodular enhancement measuring up to 8.9 cm. In the lower central portion there was a 1.7 cm circumscribed nodule consistent with a known fibroadenoma. There weas no axillary or internal mammary adenopathy noted on either side.   The 8.9 cm area of abnormality in the left breast was biopsied on 06/30/2012 showing fibrocystic changes with adenosis and calcifications, pseudoangiomatous stromal hyperplasia, but no evidence of malignancy.  The patient underwent bilateral mastectomies under the care of Dr. Brantley Stage on 07/14/2012. The left mastectomy revealed benign breast tissue and was negative for malignancy. 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 Lymphovascular invasion was identified; #2) a ductal carcinoma in situ, grade 3, with comedo necrosis.  Surgical margins were negative. 4 lymph nodes were examined, one positive for metastatic mammary carcinoma, a second positive for isolated tumor cells. pT2, pN1a  Subsequent history is detailed below.  INTERVAL HISTORY: Sherry Barry returns today for follow-up and treatment of her estrogen receptor positive breast cancer. She continues on tamoxifen. She generally tolerates this well, except that she is getting some day and nighttime hot flashes which to wake her up. Pt reports that the gabapentin that was previously prescribed for her didn't alleviate her  systems. Pt reports that her youngest son is being evaluated for some issues at this time.  REVIEW OF SYSTEMS: Pt reports that she is not exercising regularly. Pt still works a sedentary job at [employer]. A  detailed review of systems today was otherwise negative except as noted.    PAST MEDICAL HISTORY: Past Medical History:  Diagnosis Date  . Allergy   . Breast cancer ([place])   . Breast cancer ([place])    right  . History of chemotherapy    doxetaxel/carboplatin/trastuzumab  . History of migraine    last one about a week ago  . Hx of radiation therapy 01/01/13- 02/15/13   r chest wall, r supraclav/axilla 5040 cGy/28 sessions, scar boost 1000 cGy/5 sessions  . Hypertension    no meds,   urgent care on pomana  . Migraine   . Migraine     PAST SURGICAL HISTORY: Past Surgical History:  Procedure Laterality Date  . ABDOMINAL HYSTERECTOMY     no salpingo-oophorectomy 2009  . BREAST RECONSTRUCTION WITH PLACEMENT OF TISSUE EXPANDER AND FLEX HD (ACELLULAR HYDRATED DERMIS) Left 09/24/2013  . LATISSIMUS FLAP TO BREAST Right 09/24/2013   Procedure: RIGHT LATISSMUS MYOCUTAEIOUS MUSCLE FLAP AND PLACEMENT OF TISSUE [name];  Surgeon: Theodoro Kos, DO;  Location: [place];  Service: Plastics;  Laterality: Right;  . LIPOSUCTION WITH LIPOFILLING Bilateral 12/12/2013   Procedure: LIPOSUCTION WITH LIPOFILLING;  Surgeon: Theodoro Kos, DO;  Location: [place];  Service: Plastics;  Laterality: Bilateral;  . PORT-A-CATH REMOVAL Left 12/12/2013   Procedure: REMOVAL PORT-A-CATH;  Surgeon: Theodoro Kos, DO;  Location: [facility];  Service: Plastics;  Laterality: Left;  . PORTACATH PLACEMENT  07/14/2012   Procedure: INSERTION PORT-A-CATH;  Surgeon: Joyice Faster. Cornett, MD;  Location: [facility];  Service: General;  Laterality: Left;  . RECONSTRUCTION BREAST W/ LATISSIMUS DORSI FLAP Right 09/24/2013   "& tissue expander placement"  . REMOVAL OF BILATERAL TISSUE EXPANDERS WITH PLACEMENT OF BILATERAL BREAST IMPLANTS Bilateral 12/12/2013   Procedure: REMOVAL OF BILATERAL TISSUE EXPANDERS WITH PLACEMENT OF BILATERAL BREAST IMPLANTS/BILATERAL CAPSULECTOMIES WITH  LIPOFILLING FAT GRAFTING;  Surgeon:  Theodoro Kos, DO;  Location: [facility];  Service: Plastics;  Laterality: Bilateral;  . SIMPLE MASTECTOMY WITH AXILLARY SENTINEL NODE BIOPSY  07/14/2012   Procedure: SIMPLE MASTECTOMY WITH AXILLARY SENTINEL NODE BIOPSY;  Surgeon: Joyice Faster. Cornett, MD;  Location: [facility];  Service: General;  Laterality: Right;  Bilateral simple mastectomy with port and right sebtibel lymph node mapping  . SIMPLE MASTECTOMY WITH AXILLARY SENTINEL NODE BIOPSY  07/14/2012   Procedure: SIMPLE MASTECTOMY;  Surgeon: Joyice Faster. Cornett, MD;  Location: [facility];  Service: General;  Laterality: Left;  . TISSUE EXPANDER PLACEMENT Left 09/24/2013   Procedure: PLACEMENT OF TISSUE EXPANDER AND FLEX HD TO LEFT BREAST;  Surgeon: Theodoro Kos, DO;  Location: [facility];  Service: Plastics;  Laterality: Left;    FAMILY HISTORY Family History  Problem Relation Age of Onset  . Hypertension Mother   . Alcohol abuse Mother   . Heart disease Maternal Grandmother   . Stroke Maternal Grandfather   . Colon cancer Maternal Aunt 13       alive at 27  . Brain cancer Maternal Uncle 54       and lymphoma in early 80s; deceased  . Brain cancer Maternal Uncle 60       deceased  . Pancreatic cancer Maternal Uncle 45       alive at 23  .  Breast cancer Maternal Aunt        great aunt through mat GF; dx at ? age  . Breast cancer Cousin 68       mat 1st cousin once removed through mat GF ; deceased   the patient's father died in an automobile accident in his early 55s. The patient's mother is living, currently age 66. The patient has one brother and one sister. There is no family history of breast or ovarian cancer, although there are maternal uncles with brain, colon and lymph node cancers.  GYNECOLOGIC HISTORY:  (Updated 09/12/2013) Menarche age 66, first live birth age 42, she is [place] P2. She stopped having periods with her hysterectomy in March of 2009. She still has her ovaries in place.  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 Her husband Harless Nakayama works for a company in the [place] area doing testing. Their children Jaden (11) and Bryson (6) at home.   ADVANCED DIRECTIVES: not in place  HEALTH MAINTENANCE:  (Updated 09/12/2013) Social History  Substance Use Topics  . Smoking status: Never Smoker  . Smokeless tobacco: Never Used  . Alcohol use Yes     Comment: occasional    Mammogram: s/p bilateral mastectomy Colonoscopy: n/a Bone Density Scan: n/a Pap Smear: s/p TAH  Allergies  Allergen Reactions  . Codeine Itching  . Latex Hives    Current Outpatient Prescriptions  Medication Sig Dispense Refill  . fluocinonide gel (LIDEX) 7.59 % Apply 1 application topically 2 (two) times daily. 60 g 0  . tamoxifen (NOLVADEX) 20 MG tablet Take 1 tablet (20 mg total) by mouth daily. 90 tablet 3   No current facility-administered medications for this visit.     OBJECTIVE: Young African-American woman I Who appears well Vitals:   02/22/17 1046  BP: (!) 129/104  Pulse: 100  Resp: 18  Temp: 98.9 F (37.2 C)  SpO2: 100%     Body mass index is 25.57 kg/m.    ECOG FS: 0 Filed Weights   02/22/17 1046  Weight: 139 lb 12.8 oz (63.4 kg)   Physical Exam:  Sclerae unicteric, pupils round and equal Oropharynx clear and moist No cervical or supraclavicular adenopathy Lungs no rales or rhonchi Heart regular rate and rhythm Abd soft, nontender, positive bowel sounds MSK no focal spinal tenderness, no upper extremity lymphedema Neuro: nonfocal, well oriented, appropriate affect Breasts: Status post bilateral mastectomies with bilateral reconstruction. On the right in the inferior mammary fold there is a slight area of minimal induration which is related to the underlying implant. There is no evidence of disease recurrence. Both axillae are benign.  LAB RESULTS: Lab  Results  Component Value Date   WBC 5.2 02/15/2017   NEUTROABS 3.4 02/15/2017   HGB 14.7 02/15/2017   HCT 42.9 02/15/2017   MCV 86.7 02/15/2017   PLT 177 02/15/2017      Chemistry      Component Value Date/Time   NA 141 02/15/2017 0852   K 3.9 02/15/2017 0852   CL 104 09/24/2013 2121   CL 101 11/15/2012 0904   CO2 25 02/15/2017 0852   BUN 12.5 02/15/2017 0852   CREATININE 0.9 02/15/2017 0852      Component Value Date/Time   CALCIUM 9.3 02/15/2017 0852   ALKPHOS 63 02/15/2017 0852   AST 16 02/15/2017 0852   ALT 13 02/15/2017 0852   BILITOT 0.69  02/15/2017 0852       STUDIES: No results found.     ASSESSMENT: 46 y.o. BRCA negative [place] woman   (1)  status post right mastectomy and sentinel lymph node sampling 07/14/2012 (with simple left mastectomy)  for a right breast upper inner quadrant  pT2, pN1, stage IIB invasive ductal carcinoma, grade 3, estrogen receptor 100% and progesterone receptor 100% positive, with an MIB-1 of 48%, and HER-2 amplified by CISH with a ratio of 3.02.  (2) adjuvant chemotherapy started 08/17/2012, consisting of carboplatin, docetaxel and trastuzumab given every 3 weeks for 6 cycles, completed 11/30/2012  (3) trastuzumab continued for total of one year, final dose given on 08/30/2013 with good tolerance. Her last echocardiogram on 10/16/13 demonstrated a LVEF of 60-65%.  She was released from cardio-onc clinic by Dr. Aundra Dubin and will f/u with them PRN.    (4)  Status post radiation therapy, completed in early September  (5) started on tamoxifen in mid September 2014  (a) status post remote hysterectomy without salpingo-oophorectomy  (6) Eczema, previously treated with Elidel twice daily--controlled  (7)  Anxiety, lorazepam 0.5 mg twice a day as needed    (8) s/p bilateral breast reconstruction on 09/24/13 with latissmus flap and expander placement and on 12/12/13 with implant placement  (9) genetic testing June 2014 through [facility] showed  no deleterious mutations in a comprehensive cancer Gene panel  PLAN:  Sherry Barry Is now 4-1/2 years out from definitive surgery for her breast cancer with no evidence of disease recurrence. This is very favorable.  She continues on tamoxifen. She will see me again one more time a year from now and she will have completed 5 years of tamoxifen at that time.  I feel comfortable releasing her from follow-up at that time. We will discuss transitioning to the survivorship program as an alternative  I have strongly encouraged her to start an exercise program  She knows to call for any other issues that may develop before the next visit here.   Magrinat, Virgie Dad, MD  02/22/17 11:09 AM Medical Oncology and Hematology [facility] [address] [place], [place] [address] Tel. [phone]    Fax. [phone]  This document serves as a record of services personally performed by Lurline Del, MD. It was created on her behalf by Steva Colder, a trained medical scribe. The creation of this record is based on the scribe's personal observations and the provider's statements to them. This document has been checked and approved by the attending provider.

## 2017-02-25 ENCOUNTER — Encounter: Payer: Self-pay | Admitting: Oncology

## 2017-03-01 ENCOUNTER — Other Ambulatory Visit: Payer: Self-pay | Admitting: *Deleted

## 2017-03-01 MED ORDER — TAMOXIFEN CITRATE 20 MG PO TABS: 20.0000 mg | ORAL_TABLET | Freq: Every day | ORAL | 3 refills | Status: DC

## 2017-03-01 MED ORDER — VENLAFAXINE HCL ER 37.5 MG PO CP24: 37.5000 mg | ORAL_CAPSULE | Freq: Every day | ORAL | 3 refills | Status: DC

## 2017-03-24 ENCOUNTER — Other Ambulatory Visit: Payer: Self-pay | Admitting: Oncology

## 2017-03-24 DIAGNOSIS — C50211 Malignant neoplasm of upper-inner quadrant of right female breast: Secondary | ICD-10-CM

## 2017-04-06 ENCOUNTER — Other Ambulatory Visit: Payer: Self-pay | Admitting: *Deleted

## 2017-04-06 MED ORDER — TAMOXIFEN CITRATE 20 MG PO TABS: 20.0000 mg | ORAL_TABLET | Freq: Every day | ORAL | 3 refills | Status: DC

## 2017-05-04 ENCOUNTER — Other Ambulatory Visit: Payer: Self-pay | Admitting: *Deleted

## 2017-05-04 MED ORDER — VENLAFAXINE HCL ER 37.5 MG PO CP24: 37.5000 mg | ORAL_CAPSULE | Freq: Every day | ORAL | 3 refills | Status: DC

## 2017-05-09 ENCOUNTER — Other Ambulatory Visit: Payer: Self-pay | Admitting: *Deleted

## 2017-05-09 MED ORDER — VENLAFAXINE HCL ER 37.5 MG PO CP24: 37.5000 mg | ORAL_CAPSULE | Freq: Every day | ORAL | 3 refills | Status: DC

## 2017-07-30 DIAGNOSIS — K080 Exfoliation of teeth due to systemic causes: Secondary | ICD-10-CM | POA: Diagnosis not present

## 2017-08-15 ENCOUNTER — Telehealth: Payer: Self-pay

## 2017-08-15 DIAGNOSIS — Z853 Personal history of malignant neoplasm of breast: Secondary | ICD-10-CM

## 2017-08-15 DIAGNOSIS — C50211 Malignant neoplasm of upper-inner quadrant of right female breast: Secondary | ICD-10-CM

## 2017-08-15 DIAGNOSIS — B379 Candidiasis, unspecified: Secondary | ICD-10-CM

## 2017-08-15 DIAGNOSIS — Z170 Estrogen receptor positive status [ER+]: Secondary | ICD-10-CM

## 2017-08-15 MED ORDER — FLUCONAZOLE 150 MG PO TABS: 150.0000 mg | ORAL_TABLET | ORAL | 3 refills | Status: DC

## 2017-08-15 NOTE — Telephone Encounter (Signed)
Received VM from pt regarding needing refill of Diflucan for yeast infection, she is taking Tamoxifen, and reports per previous approval of Dr Jana Hakim, has received Diflucan prescription.  Reviewed progress notes.  Diflucan refill completed.  Called pt, no answer, left her VM with our contact info.

## 2017-12-21 ENCOUNTER — Encounter: Payer: Self-pay | Admitting: Family Medicine

## 2017-12-21 ENCOUNTER — Other Ambulatory Visit: Payer: Self-pay

## 2017-12-21 ENCOUNTER — Ambulatory Visit: Payer: Federal, State, Local not specified - PPO | Admitting: Family Medicine

## 2017-12-21 VITALS — BP 138/86 | HR 107 | Temp 99.0°F | Resp 18 | Ht 62.0 in | Wt 140.4 lb

## 2017-12-21 DIAGNOSIS — E663 Overweight: Secondary | ICD-10-CM

## 2017-12-21 DIAGNOSIS — Z131 Encounter for screening for diabetes mellitus: Secondary | ICD-10-CM | POA: Diagnosis not present

## 2017-12-21 DIAGNOSIS — F418 Other specified anxiety disorders: Secondary | ICD-10-CM

## 2017-12-21 DIAGNOSIS — L2082 Flexural eczema: Secondary | ICD-10-CM | POA: Diagnosis not present

## 2017-12-21 DIAGNOSIS — Z13220 Encounter for screening for lipoid disorders: Secondary | ICD-10-CM | POA: Diagnosis not present

## 2017-12-21 MED ORDER — FLUOCINONIDE 0.05 % EX GEL: 1.0000 "application " | Freq: Two times a day (BID) | CUTANEOUS | 0 refills | Status: DC

## 2017-12-21 NOTE — Progress Notes (Signed)
Chief Complaint  Patient presents with  . Establish Care    HPI   Pt reports that she has been having worsening eczema flares She reports that she was prescribed fluocinide  She reports that she gets flares during the heat and notices that after she dries off the seem less inflamed She reports that the eczema is only in her neck Rarely she has had it in the flexor compartment of the elbow.   Breast cancer now in remission  She is followed at the cancer center for mammograms   Migraines She reports that this is no longer a concern Reports that her headaches have been better since finishing her chemo  Elevated Blood Pressures She reports that she had elevated blood pressures during chemo but since then has been normal without medications She states that now she is controlled and does not require meds She thinks it was situational  BP Readings from Last 3 Encounters:  12/21/17 138/86  02/22/17 (!) 129/104  01/27/16 132/86     Past Medical History:  Diagnosis Date  . Allergy   . Breast cancer ([place])   . Breast cancer ([place])    right  . History of chemotherapy    doxetaxel/carboplatin/trastuzumab  . History of migraine    last one about a week ago  . Hx of radiation therapy 01/01/13- 02/15/13   r chest wall, r supraclav/axilla 5040 cGy/28 sessions, scar boost 1000 cGy/5 sessions  . Hypertension    no meds,   urgent care on pomana  . Migraine   . Migraine     Current Outpatient Medications  Medication Sig Dispense Refill  . fluconazole (DIFLUCAN) 150 MG tablet Take 1 tablet (150 mg total) by mouth as directed. Take one tablet today, then repeat dose in 3 days. 2 tablet 3  . fluocinonide gel (LIDEX) 2.97 % Apply 1 application topically 2 (two) times daily. 60 g 0  . tamoxifen (NOLVADEX) 20 MG tablet Take 1 tablet (20 mg total) by mouth daily. 90 tablet 3   No current facility-administered medications for this visit.     Allergies:  Allergies  Allergen Reactions  .  Codeine Itching  . Latex Hives    Past Surgical History:  Procedure Laterality Date  . ABDOMINAL HYSTERECTOMY     no salpingo-oophorectomy 2009  . BREAST RECONSTRUCTION WITH PLACEMENT OF TISSUE EXPANDER AND FLEX HD (ACELLULAR HYDRATED DERMIS) Left 09/24/2013  . LATISSIMUS FLAP TO BREAST Right 09/24/2013   Procedure: RIGHT LATISSMUS MYOCUTAEIOUS MUSCLE FLAP AND PLACEMENT OF TISSUE [name];  Surgeon: Theodoro Kos, DO;  Location: [facility];  Service: Plastics;  Laterality: Right;  . LIPOSUCTION WITH LIPOFILLING Bilateral 12/12/2013   Procedure: LIPOSUCTION WITH LIPOFILLING;  Surgeon: Theodoro Kos, DO;  Location: [facility];  Service: Plastics;  Laterality: Bilateral;  . PORT-A-CATH REMOVAL Left 12/12/2013   Procedure: REMOVAL PORT-A-CATH;  Surgeon: Theodoro Kos, DO;  Location: [facility];  Service: Plastics;  Laterality: Left;  . PORTACATH PLACEMENT  07/14/2012   Procedure: INSERTION PORT-A-CATH;  Surgeon: Joyice Faster. Cornett, MD;  Location: [facility];  Service: General;  Laterality: Left;  . RECONSTRUCTION BREAST W/ LATISSIMUS DORSI FLAP Right 09/24/2013   "& tissue expander placement"  . REMOVAL OF BILATERAL TISSUE EXPANDERS WITH PLACEMENT OF BILATERAL BREAST IMPLANTS Bilateral 12/12/2013   Procedure: REMOVAL OF BILATERAL TISSUE EXPANDERS WITH PLACEMENT OF BILATERAL BREAST IMPLANTS/BILATERAL CAPSULECTOMIES WITH  LIPOFILLING FAT GRAFTING;  Surgeon: Theodoro Kos, DO;  Location: [facility];  Service: Plastics;  Laterality: Bilateral;  . SIMPLE MASTECTOMY WITH AXILLARY SENTINEL NODE BIOPSY  07/14/2012   Procedure: SIMPLE MASTECTOMY WITH AXILLARY SENTINEL NODE BIOPSY;  Surgeon: Joyice Faster. Cornett, MD;  Location: [facility];  Service: General;  Laterality: Right;  Bilateral simple mastectomy with port and right sebtibel lymph node mapping  . SIMPLE MASTECTOMY WITH AXILLARY SENTINEL NODE BIOPSY  07/14/2012   Procedure: SIMPLE MASTECTOMY;  Surgeon: Joyice Faster. Cornett, MD;   Location: [facility];  Service: General;  Laterality: Left;  . TISSUE EXPANDER PLACEMENT Left 09/24/2013   Procedure: PLACEMENT OF TISSUE EXPANDER AND FLEX HD TO LEFT BREAST;  Surgeon: Theodoro Kos, DO;  Location: [facility];  Service: Plastics;  Laterality: Left;    Social History   Socioeconomic History  . Marital status: Married    Spouse name: Not on file  . Number of children: 2  . Years of education: Not on file  . Highest education level: Not on file  Occupational History    Employer: [employer]  Social Needs  . Financial resource strain: Not on file  . Food insecurity:    Worry: Not on file    Inability: Not on file  . Transportation needs:    Medical: Not on file    Non-medical: Not on file  Tobacco Use  . Smoking status: Never Smoker  . Smokeless tobacco: Never Used  Substance and Sexual Activity  . Alcohol use: Yes    Comment: occasional  . Drug use: No  . Sexual activity: Yes    Birth control/protection: Surgical  Lifestyle  . Physical activity:    Days per week: Not on file    Minutes per session: Not on file  . Stress: Not on file  Relationships  . Social connections:    Talks on phone: Not on file    Gets together: Not on file    Attends religious service: Not on file    Active member of club or organization: Not on file    Attends meetings of clubs or organizations: Not on file    Relationship status: Not on file  Other Topics Concern  . Not on file  Social History Narrative  . Not on file    Family History  Problem Relation Age of Onset  . Hypertension Mother   . Alcohol abuse Mother   . Heart disease Maternal Grandmother   . Stroke Maternal Grandfather   . Colon cancer Maternal Aunt 82       alive at 8  . Brain cancer Maternal Uncle 26       and lymphoma in early 69s; deceased  . Brain cancer Maternal Uncle 60       deceased  . Pancreatic cancer Maternal Uncle 45       alive at 20  . Breast cancer Maternal Aunt        great aunt through  mat GF; dx at ? age  . Breast cancer Cousin 79       mat 1st cousin once removed through mat GF ; deceased     ROS Review of Systems See HPI Constitution: No fevers or chills No malaise No diaphoresis Skin: No rash or itching Eyes: no blurry vision, no double vision GU: no dysuria or hematuria Neuro: no dizziness or headaches all others reviewed and negative   Objective: Vitals:   12/21/17 1459  BP: 138/86  Pulse: (!) 107  Resp: 18  Temp: 99 F (37.2 C)  TempSrc: Oral  SpO2: 100%  Weight: 140 lb 6.4 oz (63.7 kg)  Height: 5\' 2"  (1.575 m)  Body mass index is 25.68 kg/m.   Physical Exam  Constitutional: She is oriented to person, place, and time. She appears well-developed and well-nourished.  HENT:  Head: Normocephalic and atraumatic.  Eyes: Conjunctivae and EOM are normal.  Cardiovascular: Normal rate, regular rhythm and normal heart sounds.  No murmur heard. Pulmonary/Chest: Effort normal and breath sounds normal. No stridor. No respiratory distress.  Neurological: She is alert and oriented to person, place, and time.  Skin: Skin is warm. Capillary refill takes less than 2 seconds.  Psychiatric: She has a normal mood and affect. Her behavior is normal. Judgment and thought content normal.    Neck with bumps that are small but no excoriation There is no erythematous base No hyperpigmentation   Assessment and Plan Eliyana was seen today for establish care.  Diagnoses and all orders for this visit:  Flexural eczema- add zyrtec prn Refilled fluocinonide (Lidex) -     fluocinonide gel (LIDEX) 0.05 %; Apply 1 application topically 2 (two) times daily.  Situational anxiety- effexor was ineffective for hot flashes Anxiety appropriately linked to office visits  Overweight -   Discussed regular exercise by walking   Screen for diabetes and high cholesterol  Review of chart did not show any lipids   Cystal Shannahan A Nolon Rod

## 2017-12-21 NOTE — Patient Instructions (Signed)
     IF you received an x-ray today, you will receive an invoice from [facility]. Please contact [facility] at [phone] with questions or concerns regarding your invoice.   IF you received labwork today, you will receive an invoice from [facility]. Please contact [facility] at [phone] with questions or concerns regarding your invoice.   Our billing staff will not be able to assist you with questions regarding bills from these companies.  You will be contacted with the lab results as soon as they are available. The fastest way to get your results is to activate your My Chart account. Instructions are located on the last page of this paperwork. If you have not heard from us regarding the results in 2 weeks, please contact this office.     

## 2017-12-22 LAB — COMPREHENSIVE METABOLIC PANEL
A/G RATIO: 1.8 (ref 1.2–2.2)
ALK PHOS: 66 IU/L (ref 39–117)
ALT: 9 IU/L (ref 0–32)
AST: 18 IU/L (ref 0–40)
Albumin: 4.5 g/dL (ref 3.5–5.5)
BUN/Creatinine Ratio: 11 (ref 9–23)
BUN: 9 mg/dL (ref 6–24)
Bilirubin Total: 0.4 mg/dL (ref 0.0–1.2)
CALCIUM: 9.2 mg/dL (ref 8.7–10.2)
CO2: 20 mmol/L (ref 20–29)
Chloride: 102 mmol/L (ref 96–106)
Creatinine, Ser: 0.84 mg/dL (ref 0.57–1.00)
GFR calc Af Amer: 97 mL/min/{1.73_m2} (ref >59)
GFR, EST NON AFRICAN AMERICAN: 84 mL/min/{1.73_m2} (ref >59)
GLOBULIN, TOTAL: 2.5 g/dL (ref 1.5–4.5)
Glucose: 70 mg/dL (ref 65–99)
POTASSIUM: 3.4 mmol/L — AB (ref 3.5–5.2)
SODIUM: 141 mmol/L (ref 134–144)
Total Protein: 7 g/dL (ref 6.0–8.5)

## 2017-12-22 LAB — LIPID PANEL
CHOL/HDL RATIO: 3 ratio (ref 0.0–4.4)
CHOLESTEROL TOTAL: 196 mg/dL (ref 100–199)
HDL: 65 mg/dL (ref >39)
LDL CALC: 122 mg/dL — AB (ref 0–99)
TRIGLYCERIDES: 45 mg/dL (ref 0–149)
VLDL CHOLESTEROL CAL: 9 mg/dL (ref 5–40)

## 2017-12-22 LAB — HEMOGLOBIN A1C
ESTIMATED AVERAGE GLUCOSE: 97 mg/dL
Hgb A1c MFr Bld: 5 % (ref 4.8–5.6)

## 2018-02-21 NOTE — Progress Notes (Signed)
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 At that time a digital bilateal diagnostic mammogram showed a slight decrease in size of the benign fibroadenoma in the left breast compared to 2010. The same mammogram, however, also refilled pleomorphic calcifications extending over 9 cm in the inner third of the right breast. There were no suspicious calcifications in the left breast. On exam, Dr. Purcell Nails was able to palpate a small freely mobile mass in the lower inner left breast, but was unable to palpate any abnormalities in the right breast other than some focal thickening.  An subsequent ultrasound of the right breast on 06/16/2012 showed multiple hypoechoic masses throughout the upper quadrants of the right breast. The palpable mass in the 1:00 location corresponded with an irregular hypoechoic mass measuring 1.7 x 1.4 cm. There was also a discrete hypoechoic lobulated mass at the 11:00 position measuring 2.1 x 1.1 x 2.1 cm. The third lesion identified at the 11:00 location, 3 cm from the nipple, measured 1.0 x 0.8 x 0.9 cm.   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  Bilateral breast MRI 06/23/2012 showed an enhancing mass in the upper central right breast measuring 1.8 cm, together with clumped nodular enhancement extending for a total area of 11.4 cm. In the upper outer quadrant of this breast there were 3 discrete irregular masses measuring up to 3 cm. This correlated well with the ultrasound findings previously. In the left breast there was a large area of clumped nodular enhancement measuring up to 8.9 cm. In the lower central portion there was a 1.7 cm circumscribed nodule consistent with a known fibroadenoma. There weas no axillary or internal mammary adenopathy noted on either side.   The 8.9 cm area of abnormality in the left breast was biopsied on 06/30/2012 showing fibrocystic changes with adenosis and calcifications, pseudoangiomatous stromal hyperplasia, but no evidence of malignancy.  The patient underwent bilateral mastectomies under the care of Dr. Brantley Stage on 07/14/2012. The left mastectomy revealed benign breast tissue and was negative for malignancy. 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 Lymphovascular invasion was identified; #2) a ductal carcinoma in situ, grade 3, with comedo necrosis.  Surgical margins were negative. 4 lymph nodes were examined, one positive for metastatic mammary carcinoma, a second positive for isolated tumor cells. pT2, pN1a  Subsequent history is detailed below.  INTERVAL HISTORY: Jocilynn returns today for follow-up and treatment of her estrogen receptor positive breast cancer. She continues on tamoxifen, with good tolerance. She has occasional hot flashes that are not intense. She also has some increased vaginal discharge. She is status  post bilateral mastectomies, so we are following her with exam only.   REVIEW OF SYSTEMS: Sherry Barry reports that she has been working a different shift. Her son was diagnosed  with Asperger's Syndrome in 2018. She is now working from 6 am-2:30 to accommodate his needs later in the day. She tries to exercise by walking but it is hard for her to find time. She sleeps decently. She denies unusual headaches, visual changes, nausea, vomiting, or dizziness. There has been no unusual cough, phlegm production, or pleurisy. There has been no change in bowel or bladder habits. She denies unexplained fatigue or unexplained weight loss, bleeding, rash, or fever. A detailed review of systems was otherwise stable.    PAST MEDICAL HISTORY: Past Medical History:  Diagnosis Date  . Allergy   . Breast cancer ([place])   . Breast cancer ([place])    right  . History of chemotherapy    doxetaxel/carboplatin/trastuzumab  . History of migraine    last one about a week ago  . Hx of radiation therapy 01/01/13- 02/15/13   r chest wall, r supraclav/axilla 5040 cGy/28 sessions, scar boost 1000 cGy/5 sessions  . Hypertension    no meds,   urgent care on [place]  . Migraine   . Migraine     PAST SURGICAL HISTORY: Past Surgical History:  Procedure Laterality Date  . ABDOMINAL HYSTERECTOMY     no salpingo-oophorectomy 2009  . BREAST RECONSTRUCTION WITH PLACEMENT OF TISSUE EXPANDER AND FLEX HD (ACELLULAR HYDRATED DERMIS) Left 09/24/2013  . LATISSIMUS FLAP TO BREAST Right 09/24/2013   Procedure: RIGHT LATISSMUS MYOCUTAEIOUS MUSCLE FLAP AND PLACEMENT OF TISSUE [name];  Surgeon: Theodoro Kos, DO;  Location: [place];  Service: Plastics;  Laterality: Right;  . LIPOSUCTION WITH LIPOFILLING Bilateral 12/12/2013   Procedure: LIPOSUCTION WITH LIPOFILLING;  Surgeon: Theodoro Kos, DO;  Location: [facility];  Service: Plastics;  Laterality: Bilateral;  . PORT-A-CATH REMOVAL Left 12/12/2013   Procedure: REMOVAL PORT-A-CATH;  Surgeon: Theodoro Kos, DO;  Location: [facility];  Service: Plastics;  Laterality: Left;  . PORTACATH PLACEMENT  07/14/2012   Procedure: INSERTION PORT-A-CATH;   Surgeon: Joyice Faster. Cornett, MD;  Location: [facility];  Service: General;  Laterality: Left;  . RECONSTRUCTION BREAST W/ LATISSIMUS DORSI FLAP Right 09/24/2013   "& tissue expander placement"  . REMOVAL OF BILATERAL TISSUE EXPANDERS WITH PLACEMENT OF BILATERAL BREAST IMPLANTS Bilateral 12/12/2013   Procedure: REMOVAL OF BILATERAL TISSUE EXPANDERS WITH PLACEMENT OF BILATERAL BREAST IMPLANTS/BILATERAL CAPSULECTOMIES WITH  LIPOFILLING FAT GRAFTING;  Surgeon: Theodoro Kos, DO;  Location: [facility];  Service: Plastics;  Laterality: Bilateral;  . SIMPLE MASTECTOMY WITH AXILLARY SENTINEL NODE BIOPSY  07/14/2012   Procedure: SIMPLE MASTECTOMY WITH AXILLARY SENTINEL NODE BIOPSY;  Surgeon: Joyice Faster. Cornett, MD;  Location: [facility];  Service: General;  Laterality: Right;  Bilateral simple mastectomy with port and right sebtibel lymph node mapping  . SIMPLE MASTECTOMY WITH AXILLARY SENTINEL NODE BIOPSY  07/14/2012   Procedure: SIMPLE MASTECTOMY;  Surgeon: Joyice Faster. Cornett, MD;  Location: [facility];  Service: General;  Laterality: Left;  . TISSUE EXPANDER PLACEMENT Left 09/24/2013   Procedure: PLACEMENT OF TISSUE EXPANDER AND FLEX HD TO LEFT BREAST;  Surgeon: Theodoro Kos, DO;  Location: [facility];  Service: Plastics;  Laterality: Left;    FAMILY HISTORY Family History  Problem Relation Age of Onset  . Hypertension Mother   . Alcohol abuse Mother   . Heart disease Maternal Grandmother   . Stroke Maternal  Grandfather   . Colon cancer Maternal Aunt 26       alive at 68  . Brain cancer Maternal Uncle 44       and lymphoma in early 60s; deceased  . Brain cancer Maternal Uncle 60       deceased  . Pancreatic cancer Maternal Uncle 45       alive at 61  . Breast cancer Maternal Aunt        great aunt through mat GF; dx at ? age  . Breast cancer Cousin 53       mat 1st cousin once removed through mat GF ; deceased   the patient's father died in an automobile accident in his early 3s. The patient's mother  is living, currently age 80. The patient has one brother and one sister. There is no family history of breast or ovarian cancer, although there are maternal uncles with brain, colon and lymph node cancers.  GYNECOLOGIC HISTORY:  (Updated 09/12/2013) Menarche age 38, first live birth age 64, she is [place] P2. She stopped having periods with her hysterectomy in March of 2009. She still has her ovaries in place.  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 Her husband Harless Nakayama works for a company in the [place] doing testing. Their children Jaden (11) and Bryson (6) at home.   ADVANCED DIRECTIVES: not in place  HEALTH MAINTENANCE:  (Updated 09/12/2013) Social History   Tobacco Use  . Smoking status: Never Smoker  . Smokeless tobacco: Never Used  Substance Use Topics  . Alcohol use: Yes    Comment: occasional  . Drug use: No    Mammogram: s/p bilateral mastectomy Colonoscopy: n/a Bone Density Scan: n/a Pap Smear: s/p TAH  Allergies  Allergen Reactions  . Codeine Itching  . Latex Hives    Current Outpatient Medications  Medication Sig Dispense Refill  . cholecalciferol (VITAMIN D) 1000 units tablet Take 1 tablet (1,000 Units total) by mouth daily. 100 tablet 4   No current facility-administered medications for this visit.     OBJECTIVE: Young African-American woman in no acute distress Vitals:   02/22/18 1312  BP: (!) 157/96  Pulse: 77  Resp: 18  Temp: 99 F (37.2 C)  SpO2: 100%     Body mass index is 25.66 kg/m.    ECOG FS: 0 Filed Weights   02/22/18 1312  Weight: 140 lb 4.8 oz (63.6 kg)   Physical Exam:  Sclerae unicteric, EOMs intact Oropharynx clear and moist No cervical or supraclavicular adenopathy Lungs no rales or rhonchi Heart regular rate and rhythm Abd soft, nontender, positive bowel sounds MSK no focal spinal  tenderness, no upper extremity lymphedema Neuro: nonfocal, well oriented, appropriate affect Breasts: Status post bilateral mastectomies with bilateral reconstruction.  There is no evidence of local recurrence.  Both axillae are benign.   LAB RESULTS: Lab Results  Component Value Date   WBC 5.5 02/22/2018   NEUTROABS 3.1 02/22/2018   HGB 13.9 02/22/2018   HCT 40.1 02/22/2018   MCV 87.0 02/22/2018   PLT 174 02/22/2018      Chemistry      Component Value Date/Time   NA 142 02/22/2018 1247   NA 141 12/21/2017 1602   NA 141 02/15/2017 0852   K 3.6 02/22/2018 1247   K 3.9 02/15/2017 0852   CL 107 02/22/2018 1247   CL 101  11/15/2012 0904   CO2 26 02/22/2018 1247   CO2 25 02/15/2017 0852   BUN 11 02/22/2018 1247   BUN 9 12/21/2017 1602   BUN 12.5 02/15/2017 0852   CREATININE 0.92 02/22/2018 1247   CREATININE 0.9 02/15/2017 0852      Component Value Date/Time   CALCIUM 9.1 02/22/2018 1247   CALCIUM 9.3 02/15/2017 0852   ALKPHOS 69 02/22/2018 1247   ALKPHOS 63 02/15/2017 0852   AST 16 02/22/2018 1247   AST 16 02/15/2017 0852   ALT 10 02/22/2018 1247   ALT 13 02/15/2017 0852   BILITOT 0.4 02/22/2018 1247   BILITOT 0.4 12/21/2017 1602   BILITOT 0.69 02/15/2017 0852       STUDIES: No results found.     ASSESSMENT: 47 y.o. BRCA negative [place] woman   (1)  status post right mastectomy and sentinel lymph node sampling 07/14/2012 (with simple left mastectomy)  for a right breast upper inner quadrant  pT2, pN1, stage IIB invasive ductal carcinoma, grade 3, estrogen receptor 100% and progesterone receptor 100% positive, with an MIB-1 of 48%, and HER-2 amplified by CISH with a ratio of 3.02.  (2) adjuvant chemotherapy started 08/17/2012, consisting of carboplatin, docetaxel and trastuzumab given every 3 weeks for 6 cycles, completed 11/30/2012  (3) trastuzumab continued for total of one year, final dose given on 08/30/2013 with good tolerance. Her last echocardiogram on  10/16/13 demonstrated a LVEF of 60-65%.  She was released from cardio-onc clinic by Dr. Aundra Dubin and will f/u with them PRN.    (4)  Status post radiation therapy, completed in early September  (5) started on tamoxifen in mid September 2014, completing 5 years September 2019  (a) status post remote hysterectomy without salpingo-oophorectomy  (6) Eczema, previously treated with Elidel twice daily--controlled  (7)  Anxiety, lorazepam 0.5 mg twice a day as needed    (8) s/p bilateral breast reconstruction on 09/24/13 with latissmus flap and expander placement and on 12/12/13 with implant placement  (9) genetic testing June 2014 through [facility] showed no deleterious mutations in a comprehensive cancer Gene panel  PLAN:  Jazmine is now 5-1/2 years out from definitive surgery for breast cancer with no evidence of disease recurrence.  This is very favorable.  She has completed 5 years of tamoxifen.  We discussed the fact that an additional 5 years might provide a very small benefit in terms of risk reduction.  She does not feel it is warranted and I am comfortable with her stopping at this point  Accordingly I am releasing her to her primary care physician.  All she will need in terms of breast cancer follow-up is a yearly chest wall breast exam  I will be glad to see Alleen Borne again at any point in the future if and when the need arises but as of now are making no further routine appointments for her here.  Tamarion Haymond, Virgie Dad, MD  02/22/18 1:42 PM Medical Oncology and Hematology [facility] [address] [place], [place] [address] Tel. [phone]    Fax. [phone]  Alice Rieger, am acting as scribe for Chauncey Cruel MD.  I, Lurline Del MD, have reviewed the above documentation for accuracy and completeness, and I agree with the above.

## 2018-02-22 ENCOUNTER — Inpatient Hospital Stay: Payer: Federal, State, Local not specified - PPO

## 2018-02-22 ENCOUNTER — Inpatient Hospital Stay: Payer: Federal, State, Local not specified - PPO | Attending: Oncology | Admitting: Oncology

## 2018-02-22 VITALS — BP 157/96 | HR 77 | Temp 99.0°F | Resp 18 | Ht 62.0 in | Wt 140.3 lb

## 2018-02-22 DIAGNOSIS — Z800 Family history of malignant neoplasm of digestive organs: Secondary | ICD-10-CM | POA: Diagnosis not present

## 2018-02-22 DIAGNOSIS — Z803 Family history of malignant neoplasm of breast: Secondary | ICD-10-CM

## 2018-02-22 DIAGNOSIS — Z923 Personal history of irradiation: Secondary | ICD-10-CM

## 2018-02-22 DIAGNOSIS — C50211 Malignant neoplasm of upper-inner quadrant of right female breast: Secondary | ICD-10-CM | POA: Diagnosis not present

## 2018-02-22 DIAGNOSIS — Z79899 Other long term (current) drug therapy: Secondary | ICD-10-CM | POA: Diagnosis not present

## 2018-02-22 DIAGNOSIS — Z90710 Acquired absence of both cervix and uterus: Secondary | ICD-10-CM

## 2018-02-22 DIAGNOSIS — L309 Dermatitis, unspecified: Secondary | ICD-10-CM | POA: Diagnosis not present

## 2018-02-22 DIAGNOSIS — F419 Anxiety disorder, unspecified: Secondary | ICD-10-CM | POA: Diagnosis not present

## 2018-02-22 DIAGNOSIS — Z79810 Long term (current) use of selective estrogen receptor modulators (SERMs): Secondary | ICD-10-CM | POA: Diagnosis not present

## 2018-02-22 DIAGNOSIS — I10 Essential (primary) hypertension: Secondary | ICD-10-CM | POA: Diagnosis not present

## 2018-02-22 DIAGNOSIS — Z9013 Acquired absence of bilateral breasts and nipples: Secondary | ICD-10-CM | POA: Diagnosis not present

## 2018-02-22 DIAGNOSIS — Z9221 Personal history of antineoplastic chemotherapy: Secondary | ICD-10-CM | POA: Diagnosis not present

## 2018-02-22 DIAGNOSIS — Z170 Estrogen receptor positive status [ER+]: Secondary | ICD-10-CM | POA: Diagnosis not present

## 2018-02-22 DIAGNOSIS — Z808 Family history of malignant neoplasm of other organs or systems: Secondary | ICD-10-CM

## 2018-02-22 LAB — CBC WITH DIFFERENTIAL/PLATELET
BASOS PCT: 0 %
Basophils Absolute: 0 10*3/uL (ref 0.0–0.1)
EOS ABS: 0.1 10*3/uL (ref 0.0–0.5)
EOS PCT: 2 %
HCT: 40.1 % (ref 34.8–46.6)
HEMOGLOBIN: 13.9 g/dL (ref 11.6–15.9)
LYMPHS ABS: 1.9 10*3/uL (ref 0.9–3.3)
LYMPHS PCT: 34 %
MCH: 30.2 pg (ref 25.1–34.0)
MCHC: 34.7 g/dL (ref 31.5–36.0)
MCV: 87 fL (ref 79.5–101.0)
MONO ABS: 0.4 10*3/uL (ref 0.1–0.9)
Monocytes Relative: 7 %
NEUTROS ABS: 3.1 10*3/uL (ref 1.5–6.5)
Neutrophils Relative %: 57 %
PLATELETS: 174 10*3/uL (ref 145–400)
RBC: 4.61 MIL/uL (ref 3.70–5.45)
RDW: 12.4 % (ref 11.2–14.5)
WBC: 5.5 10*3/uL (ref 3.9–10.3)

## 2018-02-22 LAB — COMPREHENSIVE METABOLIC PANEL
ALBUMIN: 3.7 g/dL (ref 3.5–5.0)
ALT: 10 U/L (ref 0–44)
ANION GAP: 9 (ref 5–15)
AST: 16 U/L (ref 15–41)
Alkaline Phosphatase: 69 U/L (ref 38–126)
BILIRUBIN TOTAL: 0.4 mg/dL (ref 0.3–1.2)
BUN: 11 mg/dL (ref 6–20)
CO2: 26 mmol/L (ref 22–32)
Calcium: 9.1 mg/dL (ref 8.9–10.3)
Chloride: 107 mmol/L (ref 98–111)
Creatinine, Ser: 0.92 mg/dL (ref 0.44–1.00)
GFR calc Af Amer: >60 mL/min (ref >60)
GFR calc non Af Amer: >60 mL/min (ref >60)
GLUCOSE: 86 mg/dL (ref 70–99)
POTASSIUM: 3.6 mmol/L (ref 3.5–5.1)
SODIUM: 142 mmol/L (ref 135–145)
TOTAL PROTEIN: 6.6 g/dL (ref 6.5–8.1)

## 2018-02-22 MED ORDER — VITAMIN D 1000 UNITS PO TABS: 1000.0000 [IU] | ORAL_TABLET | Freq: Every day | ORAL | 4 refills | Status: DC

## 2018-02-23 ENCOUNTER — Telehealth: Payer: Self-pay | Admitting: Oncology

## 2018-02-23 NOTE — Telephone Encounter (Signed)
Per 9/11 no los °

## 2018-03-16 DIAGNOSIS — K080 Exfoliation of teeth due to systemic causes: Secondary | ICD-10-CM | POA: Diagnosis not present

## 2018-04-06 DIAGNOSIS — H9202 Otalgia, left ear: Secondary | ICD-10-CM | POA: Diagnosis not present

## 2018-04-06 DIAGNOSIS — R221 Localized swelling, mass and lump, neck: Secondary | ICD-10-CM | POA: Diagnosis not present

## 2018-04-06 DIAGNOSIS — R51 Headache: Secondary | ICD-10-CM | POA: Diagnosis not present

## 2018-12-18 ENCOUNTER — Encounter: Payer: Self-pay | Admitting: Oncology

## 2019-03-26 DIAGNOSIS — K080 Exfoliation of teeth due to systemic causes: Secondary | ICD-10-CM | POA: Diagnosis not present

## 2019-05-16 ENCOUNTER — Other Ambulatory Visit: Payer: Self-pay

## 2019-05-16 DIAGNOSIS — Z20822 Contact with and (suspected) exposure to covid-19: Secondary | ICD-10-CM

## 2019-05-18 LAB — NOVEL CORONAVIRUS, NAA: SARS-CoV-2, NAA: DETECTED — AB

## 2019-05-19 ENCOUNTER — Telehealth: Payer: Self-pay | Admitting: Nurse Practitioner

## 2019-05-19 NOTE — Telephone Encounter (Signed)
  I connected by phone with Sherry Barry on 05/19/2019 at 10:12 AM to discuss the potential use of an new treatment for mild to moderate COVID-19 viral infection in non-hospitalized patients.  This patient is a 48 y.o. female that does not meet the FDA criteria for Emergency Use Authorization of bamlanivimab or casirivimab\imdevimab.  Has a (+) direct SARS-CoV-2 viral test result  Has mild or moderate COVID-19   Is ? 46 years of age and weighs ? 40 kg  Is NOT hospitalized due to COVID-19  Is NOT requiring oxygen therapy or requiring an increase in baseline oxygen flow rate due to COVID-19  Is within 10 days of symptom onset  Has at least one of the high risk factor(s) for progression to severe COVID-19 and/or hospitalization as defined in EUA.  Specific high risk criteria : none at this time, has not received chemotherapy for several years and has no DM or CKD + no obesity  Reviewed current active problem list, all chronic health issues are currently followed by her PCP.  I have spoken and communicated the following to the patient or parent/caregiver:  1. FDA has authorized the emergency use of bamlanivimab for the treatment of mild to moderate COVID-19 in adults and pediatric patients with positive results of direct SARS-CoV-2 viral testing who are 69 years of age and older weighing at least 40 kg, and who are at high risk for progressing to severe COVID-19 and/or hospitalization.  2. The significant known and potential risks and benefits of bamlanivimab, and the extent to which such potential risks and benefits are unknown.  3. Information on available alternative treatments and the risks and benefits of those alternatives, including clinical trials.  4. Patients treated with bamlanivimab should continue to self-isolate and use infection control measures (e.g., wear mask, isolate, social distance, avoid sharing personal items, clean and disinfect "high touch" surfaces, and  frequent handwashing) according to CDC guidelines.   5. The patient or parent/caregiver has the option to accept or refuse bamlanivimab.  After reviewing this information with the patient, it is determined she does not qualify for transfusion as < 55 with no obesity, DM, CKD, and no current chemotherapy treatment.  She stated appreciation for call.  She also provided permission to provider to review her two son's Covid results, notes placed in their charts.  Educated her on 10 day quarantine period and need to monitor symptoms, if worsening symptoms such as SOB or difficulty breathing to immediately seek attention.  [name] 05/19/2019 10:12 AM

## 2019-05-24 ENCOUNTER — Encounter: Payer: Self-pay | Admitting: Family Medicine

## 2019-08-02 ENCOUNTER — Inpatient Hospital Stay: Admission: RE | Admit: 2019-08-02 | Payer: Federal, State, Local not specified - PPO | Source: Ambulatory Visit

## 2019-08-02 DIAGNOSIS — R29810 Facial weakness: Secondary | ICD-10-CM | POA: Diagnosis not present

## 2019-08-02 DIAGNOSIS — I10 Essential (primary) hypertension: Secondary | ICD-10-CM | POA: Diagnosis not present

## 2019-08-02 DIAGNOSIS — R000 Tachycardia, unspecified: Secondary | ICD-10-CM | POA: Diagnosis not present

## 2019-08-02 DIAGNOSIS — R531 Weakness: Secondary | ICD-10-CM | POA: Diagnosis not present

## 2019-08-13 ENCOUNTER — Other Ambulatory Visit: Payer: Self-pay

## 2019-08-13 ENCOUNTER — Ambulatory Visit: Payer: Federal, State, Local not specified - PPO | Admitting: Family Medicine

## 2019-08-13 VITALS — BP 138/86 | HR 65 | Temp 97.9°F | Resp 14 | Ht 62.0 in | Wt 147.0 lb

## 2019-08-13 DIAGNOSIS — F4322 Adjustment disorder with anxiety: Secondary | ICD-10-CM

## 2019-08-13 DIAGNOSIS — M62838 Other muscle spasm: Secondary | ICD-10-CM

## 2019-08-13 DIAGNOSIS — G44209 Tension-type headache, unspecified, not intractable: Secondary | ICD-10-CM

## 2019-08-13 DIAGNOSIS — Z23 Encounter for immunization: Secondary | ICD-10-CM | POA: Diagnosis not present

## 2019-08-13 DIAGNOSIS — R030 Elevated blood-pressure reading, without diagnosis of hypertension: Secondary | ICD-10-CM | POA: Diagnosis not present

## 2019-08-13 MED ORDER — ESCITALOPRAM OXALATE 10 MG PO TABS: 10.0000 mg | ORAL_TABLET | Freq: Every day | ORAL | 3 refills | Status: DC

## 2019-08-13 NOTE — Progress Notes (Signed)
Established Patient Office Visit  Subjective:  Patient ID: Sherry Barry, female    DOB: 1971-06-02  Age: 49 y.o. MRN: [id number]  CC:  Chief Complaint  Patient presents with  . 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 She was also working from home.  Her youngest has Asperger's and has some issues with defiance.  She states that she was having palpitations.  Paramedics took vitals  BP 166/102 HR 102 SpO2 100% CBG 121 T 98.8   She reports that she reports that she is working on so many projects. 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 Allergy   . Breast cancer ([place])   . Breast cancer ([place])    right  . History of chemotherapy    doxetaxel/carboplatin/trastuzumab  . History of migraine    last one about a week ago  . Hx of radiation therapy 01/01/13- 02/15/13   r chest wall, r supraclav/axilla 5040 cGy/28 sessions, scar boost 1000 cGy/5 sessions  . Hypertension    no meds,   urgent care on [place]  . Migraine   . Migraine     Past Surgical History:  Procedure Laterality Date  . ABDOMINAL HYSTERECTOMY     no salpingo-oophorectomy 2009  . BREAST RECONSTRUCTION WITH PLACEMENT OF TISSUE EXPANDER AND FLEX HD (ACELLULAR HYDRATED DERMIS) Left 09/24/2013  . LATISSIMUS FLAP TO BREAST Right 09/24/2013   Procedure: RIGHT LATISSMUS MYOCUTAEIOUS MUSCLE FLAP AND PLACEMENT OF TISSUE [name];  Surgeon: Theodoro Kos, DO;   Location: [facility];  Service: Plastics;  Laterality: Right;  . LIPOSUCTION WITH LIPOFILLING Bilateral 12/12/2013   Procedure: LIPOSUCTION WITH LIPOFILLING;  Surgeon: Theodoro Kos, DO;  Location: [facility];  Service: Plastics;  Laterality: Bilateral;  . PORT-A-CATH REMOVAL Left 12/12/2013   Procedure: REMOVAL PORT-A-CATH;  Surgeon: Theodoro Kos, DO;  Location: [facility];  Service: Plastics;  Laterality: Left;  . PORTACATH PLACEMENT  07/14/2012   Procedure: INSERTION PORT-A-CATH;  Surgeon: Joyice Faster. Cornett, MD;  Location: [facility];  Service: General;  Laterality: Left;  . RECONSTRUCTION BREAST W/ LATISSIMUS DORSI FLAP Right 09/24/2013   "& tissue expander placement"  . REMOVAL OF BILATERAL TISSUE EXPANDERS WITH PLACEMENT OF BILATERAL BREAST IMPLANTS Bilateral 12/12/2013   Procedure: REMOVAL OF BILATERAL TISSUE EXPANDERS WITH PLACEMENT OF BILATERAL BREAST IMPLANTS/BILATERAL CAPSULECTOMIES WITH  LIPOFILLING FAT GRAFTING;  Surgeon: Theodoro Kos, DO;  Location: [facility];  Service: Plastics;  Laterality: Bilateral;  . SIMPLE MASTECTOMY WITH AXILLARY SENTINEL NODE BIOPSY  07/14/2012   Procedure: SIMPLE MASTECTOMY WITH AXILLARY SENTINEL NODE BIOPSY;  Surgeon: Joyice Faster. Cornett, MD;  Location: [facility];  Service: General;  Laterality: Right;  Bilateral simple mastectomy with port and right sebtibel lymph node mapping  . SIMPLE MASTECTOMY WITH AXILLARY SENTINEL NODE BIOPSY  07/14/2012   Procedure: SIMPLE MASTECTOMY;  Surgeon: Joyice Faster. Cornett, MD;  Location:  [facility];  Service: General;  Laterality: Left;  . TISSUE EXPANDER PLACEMENT Left 09/24/2013   Procedure: PLACEMENT OF TISSUE EXPANDER AND FLEX HD TO LEFT BREAST;  Surgeon: Theodoro Kos, DO;  Location: [facility];  Service: Plastics;  Laterality: Left;    Family History  Problem Relation Age of Onset  . Hypertension Mother   . Alcohol abuse Mother   . Heart disease Maternal Grandmother   . Stroke Maternal Grandfather   .  Colon cancer Maternal Aunt 19       alive at 38  . Brain cancer Maternal Uncle 67       and lymphoma in early 51s; deceased  . Brain cancer Maternal Uncle 60       deceased  . Pancreatic cancer Maternal Uncle 45       alive at 36  . Breast cancer Maternal Aunt        great aunt through mat GF; dx at ? age  . Breast cancer Cousin 26       mat 1st cousin once removed through mat GF ; deceased    Social History   Socioeconomic History  . Marital status: Married    Spouse name: Not on file  . Number of children: 2  . Years of education: Not on file  . Highest education level: Not on file  Occupational History    Employer: [employer]  Tobacco Use  . Smoking status: Never Smoker  . Smokeless tobacco: Never Used  Substance and Sexual Activity  . Alcohol use: Yes    Comment: occasional  . Drug use: No  . Sexual activity: Yes    Birth control/protection: Surgical  Other Topics Concern  . Not on file  Social History Narrative  . Not on file   Social Determinants of Health   Financial Resource Strain:   . Difficulty of Paying Living Expenses: Not on file  Food Insecurity:   . Worried About Charity fundraiser in the Last Year: Not on file  . Ran Out of Food in the Last Year: Not on file  Transportation Needs:   . Lack of Transportation (Medical): Not on file  . Lack of Transportation (Non-Medical): Not on file  Physical Activity:   . Days of Exercise per Week: Not on file  . Minutes of Exercise per Session: Not on file  Stress:   . Feeling of Stress : Not on file  Social Connections:   . Frequency of Communication with Friends and Family: Not on file  . Frequency of Social Gatherings with Friends and Family: Not on file  . Attends Religious Services: Not on file  . Active Member of Clubs or Organizations: Not on file  . Attends Archivist Meetings: Not on file  . Marital Status: Not on file  Intimate Partner Violence:   . Fear of Current or Ex-Partner:  Not on file  . Emotionally Abused: Not on file  . Physically Abused: Not on file  . Sexually Abused: Not on file    Outpatient Medications Prior to Visit  Medication Sig Dispense Refill  . cholecalciferol (VITAMIN D) 1000 units tablet Take 1 tablet (1,000 Units total) by mouth daily. 100 tablet 4   No facility-administered medications prior to visit.    Allergies  Allergen Reactions  . Codeine Itching and Hives  . Latex Hives    ROS Review of Systems Review of Systems  Constitutional: Negative for activity change, appetite change, chills and fever.  HENT: Negative for congestion, nosebleeds, trouble swallowing and voice change.   Respiratory: Negative for cough, shortness of breath and wheezing.   Gastrointestinal: Negative for diarrhea, nausea and vomiting.  Genitourinary: Negative for difficulty urinating, dysuria, flank pain and hematuria.  Musculoskeletal: Negative for back pain, joint swelling and neck pain.  Neurological: Negative for dizziness, speech difficulty, light-headedness and numbness.  See HPI. All other review of systems negative.     Objective:    Physical Exam  BP 138/86   Pulse 65   Temp 97.9 F (36.6 C) (Temporal)   Resp 14   Ht 5' 2" (1.575 m)   Wt 147 lb (66.7 kg)   SpO2 97%   BMI 26.89 kg/m  Wt Readings from Last 3 Encounters:  08/13/19 147 lb (66.7 kg)  02/22/18 140 lb 4.8 oz (63.6 kg)  12/21/17 140 lb 6.4 oz (63.7 kg)   Physical Exam  Constitutional: Oriented to person, place, and time. Appears well-developed and well-nourished.  HENT:  Head: Normocephalic and atraumatic.  Eyes: Conjunctivae and EOM are normal.  Cardiovascular: Normal rate, regular rhythm, normal heart sounds and intact distal pulses.  No murmur heard. Pulmonary/Chest: Effort normal and breath sounds normal. No stridor. No respiratory distress. Has no wheezes.  Neurological: Is alert and oriented to person, place, and time.  Skin: Skin is warm. Capillary refill  takes less than 2 seconds.  Psychiatric: Has a normal mood and affect. Behavior is normal. Judgment and thought content normal.   CRANIAL NERVES: CN III, IV, VI: extraocular movement are normal. No ptosis. CN V: Facial sensation is intact to pinprick in all 3 divisions bilaterally. Corneal responses are intact.  CN VII: Face is symmetric with normal eye closure and smile. CN VIII: Hearing is normal to rubbing fingers CN IX, X: Palate elevates symmetrically. Phonation is normal. CN XI: Head turning and shoulder shrug are intact CN XII: Tongue is midline with normal movements and no atrophy.  Health Maintenance Due  Topic Date Due  . HIV Screening  06/27/1987  . TETANUS/TDAP  06/27/1991    There are no preventive care reminders to display for this patient.  Lab Results  Component Value Date   TSH 2.160 05/02/2012   Lab Results  Component Value Date   WBC 5.5 02/22/2018   HGB 13.9 02/22/2018   HCT 40.1 02/22/2018   MCV 87.0 02/22/2018   PLT 174 02/22/2018   Lab Results  Component Value Date   NA 142 02/22/2018   K 3.6 02/22/2018   CHLORIDE 109 02/15/2017   CO2 26 02/22/2018   GLUCOSE 86 02/22/2018   BUN 11 02/22/2018   CREATININE 0.92 02/22/2018   BILITOT 0.4 02/22/2018   ALKPHOS 69 02/22/2018   AST 16 02/22/2018   ALT 10 02/22/2018   PROT 6.6 02/22/2018   ALBUMIN 3.7 02/22/2018   CALCIUM 9.1 02/22/2018   ANIONGAP 9 02/22/2018   EGFR >90 02/15/2017   Lab Results  Component Value Date   CHOL 196 12/21/2017   Lab Results  Component Value Date   HDL 65 12/21/2017   Lab Results  Component Value Date   LDLCALC 122 (H) 12/21/2017   Lab Results  Component Value Date   TRIG 45 12/21/2017   Lab Results  Component Value Date   CHOLHDL 3.0 12/21/2017   Lab Results  Component Value Date   HGBA1C 5.0 12/21/2017      Assessment & Plan:   Problem List Items Addressed This Visit    None  Visit Diagnoses    Adjustment disorder with anxious mood    -   Primary -    Relevant Medications   escitalopram (LEXAPRO) 10 MG tablet   Need for prophylactic vaccination and inoculation against influenza       Relevant Orders   Flu Vaccine QUAD 36+ mos IM     Elevated bp without diagnosis of hypertension - discussed treating her stress and anxiety.  Her initial bp and bp in the field was elevated.  Will monitor closely.  Tension headache Trapezius muscle spasm -  Advised pt to follow up with PT -  Will treat underlying stress    Meds ordered this encounter  Medications  . escitalopram (LEXAPRO) 10 MG tablet    Sig: Take 1 tablet (10 mg total) by mouth daily.    Dispense:  30 tablet    Refill:  3    Follow-up: No follow-ups on file.    Forrest Moron, MD

## 2019-08-13 NOTE — Patient Instructions (Addendum)
If you have lab work done today you will be contacted with your lab results within the next 2 weeks.  If you have not heard from [place] then please contact us. The fastest way to get your results is to register for My Chart.   IF you received an x-ray today, you will receive an invoice from [facility]. Please contact [facility] at [phone] with questions or concerns regarding your invoice.   IF you received labwork today, you will receive an invoice from [facility]. Please contact [facility] at [phone] with questions or concerns regarding your invoice.   Our billing staff will not be able to assist you with questions regarding bills from these companies.  You will be contacted with the lab results as soon as they are available. The fastest way to get your results is to activate your My Chart account. Instructions are located on the last page of this paperwork. If you have not heard from [place] regarding the results in 2 weeks, please contact this office.     Adjustment Disorder, Adult Adjustment disorder is a group of symptoms that can develop after a stressful life event, such as the loss of a job or serious physical illness. The symptoms can affect how you feel, think, and act. They may interfere with your relationships. Adjustment disorder increases your risk of suicide and substance abuse. If this disorder is not managed early, it can develop into a more serious condition, such as major depressive disorder or post-traumatic stress disorder. What are the causes? This condition happens when you have trouble recovering from or coping with a stressful life event. What increases the risk? You are more likely to develop this condition if:  You have had depression or anxiety.  You are being treated for a long-term (chronic) illness.  You are being treated for an illness that cannot be cured (terminal illness).  You have a family history of mental illness. What are  the signs or symptoms? Symptoms of this condition include:  Extreme trouble doing daily tasks, such as going to work.  Sadness, depression, or crying spells.  Worrying a lot.  Loss of enjoyment.  Change in appetite or weight.  Feelings of loss or hopelessness.  Thoughts of suicide.  Anxiety, worry, or nervousness.  Trouble sleeping.  Avoiding family and friends.  Fighting or vandalism.  Complaining of feeling sick without being ill.  Feeling dazed or disconnected.  Nightmares.  Trouble sleeping.  Irritability.  Reckless driving.  Poor work Systems analyst.  Ignoring bills. Symptoms of this condition start within three months of the stressful event. They do not last more than six months, unless the stressful circumstances last longer. Normal grieving after the death of a loved one is not a symptom of this condition. How is this diagnosed? To diagnose this condition, your health care provider will ask about what has happened in your life and how it has affected you. He or she may also ask about your medical history and your use of medicines, alcohol, and other substances. Your health care provider may do a physical exam and order lab tests or other studies. You may be referred to a mental health specialist. How is this treated? Treatment options for this condition include:  Counseling or talk therapy. Talk therapy is usually provided by mental health specialists.  Medicines. Certain medicines may help with depression, anxiety, and sleep.  Support groups. These offer emotional support, advice, and guidance. They are made up of people who have  had similar experiences.  Observation and time. This is sometimes called "watchful waiting." In this treatment, health care providers monitor your health and behavior without other treatment. Adjustment disorder sometimes gets better on its own with time. Follow these instructions at home:  Take over-the-counter and prescription  medicines only as told by your health care provider.  Keep all follow-up visits as told by your health care provider. This is important. Contact a health care provider if:  Your symptoms do not improve in six months.  Your symptoms get worse. Get help right away if:  You have serious thoughts about hurting yourself or someone else. If you ever feel like you may hurt yourself or others, or have thoughts about taking your own life, get help right away. You can go to your nearest emergency department or call:  Your local emergency services (911 in the [place]  A suicide crisis helpline, such as the [facility] at 938-213-5022. This is open 24 hours a day. Summary  Adjustment disorder is a group of symptoms that can develop after a stressful life event, such as the loss of a job or serious physical illness. The symptoms can affect how you feel, think, and act. They may interfere with your relationships.  Symptoms of this condition start within three months of the stressful event. They do not last more than six months, unless the stressful circumstances last longer.  Treatment may include talk therapy, medicines, participation in a support group, or observation to see if symptoms improve.  Contact your health care provider if your symptoms get worse or do not improve in six months.  If you ever feel like you may hurt yourself or others, or have thoughts about taking your own life, get help right away. This information is not intended to replace advice given to you by your health care provider. Make sure you discuss any questions you have with your health care provider. Document Revised: 05/13/2017 Document Reviewed: 07/30/2016 Elsevier Patient Education  [place].

## 2019-08-27 ENCOUNTER — Ambulatory Visit: Payer: Federal, State, Local not specified - PPO | Attending: Family Medicine

## 2019-08-27 ENCOUNTER — Other Ambulatory Visit: Payer: Self-pay

## 2019-08-27 DIAGNOSIS — M62830 Muscle spasm of back: Secondary | ICD-10-CM

## 2019-08-27 DIAGNOSIS — G44221 Chronic tension-type headache, intractable: Secondary | ICD-10-CM

## 2019-08-28 NOTE — Therapy (Addendum)
[place], [place], [address] Phone: [phone]   Fax:  [phone]  Physical Therapy Evaluation / Discharge  Patient Details  Name: Sherry Barry MRN: [id number] Date of Birth: September 20, 1970 Referring Provider (PT): Forrest Moron, MD   Encounter Date: 08/27/2019  PT End of Session - 08/28/19 0732    Visit Number  1    Number of Visits  6    Date for PT Re-Evaluation  10/09/19    Authorization Type  [employer] emploee PPO    PT Start Time  0745    PT Stop Time  0834    PT Time Calculation (min)  49 min    Activity Tolerance  Patient tolerated treatment well    Behavior During Therapy  [facility] for tasks assessed/performed       Past Medical History:  Diagnosis Date  . Allergy   . Breast cancer ([place])   . Breast cancer ([place])    right  . History of chemotherapy    doxetaxel/carboplatin/trastuzumab  . History of migraine    last one about a week ago  . Hx of radiation therapy 01/01/13- 02/15/13   r chest wall, r supraclav/axilla 5040 cGy/28 sessions, scar boost 1000 cGy/5 sessions  . Hypertension    no meds,   urgent care on pomana  . Migraine   . Migraine     Past Surgical History:  Procedure Laterality Date  . ABDOMINAL HYSTERECTOMY     no salpingo-oophorectomy 2009  . BREAST RECONSTRUCTION WITH PLACEMENT OF TISSUE EXPANDER AND FLEX HD (ACELLULAR HYDRATED DERMIS) Left 09/24/2013  . LATISSIMUS FLAP TO BREAST Right 09/24/2013   Procedure: RIGHT LATISSMUS MYOCUTAEIOUS MUSCLE FLAP AND PLACEMENT OF TISSUE [name];  Surgeon: Theodoro Kos, DO;  Location: [place];  Service: Plastics;  Laterality: Right;  . LIPOSUCTION WITH LIPOFILLING Bilateral 12/12/2013   Procedure: LIPOSUCTION WITH LIPOFILLING;  Surgeon: Theodoro Kos, DO;  Location: [place];  Service: Plastics;  Laterality: Bilateral;  . PORT-A-CATH REMOVAL Left 12/12/2013   Procedure: REMOVAL PORT-A-CATH;  Surgeon: Theodoro Kos, DO;   Location: [place];  Service: Plastics;  Laterality: Left;  . PORTACATH PLACEMENT  07/14/2012   Procedure: INSERTION PORT-A-CATH;  Surgeon: Joyice Faster. Cornett, MD;  Location: [place] [facility];  Service: General;  Laterality: Left;  . RECONSTRUCTION BREAST W/ LATISSIMUS DORSI FLAP Right 09/24/2013   "& tissue expander placement"  . REMOVAL OF BILATERAL TISSUE EXPANDERS WITH PLACEMENT OF BILATERAL BREAST IMPLANTS Bilateral 12/12/2013   Procedure: REMOVAL OF BILATERAL TISSUE EXPANDERS WITH PLACEMENT OF BILATERAL BREAST IMPLANTS/BILATERAL CAPSULECTOMIES WITH  LIPOFILLING FAT GRAFTING;  Surgeon: Theodoro Kos, DO;  Location: [place];  Service: Plastics;  Laterality: Bilateral;  . SIMPLE MASTECTOMY WITH AXILLARY SENTINEL NODE BIOPSY  07/14/2012   Procedure: SIMPLE MASTECTOMY WITH AXILLARY SENTINEL NODE BIOPSY;  Surgeon: Joyice Faster. Cornett, MD;  Location: [place];  Service: General;  Laterality: Right;  Bilateral simple mastectomy with port and right sebtibel lymph node mapping  . SIMPLE MASTECTOMY WITH AXILLARY SENTINEL NODE BIOPSY  07/14/2012   Procedure: SIMPLE MASTECTOMY;  Surgeon: Joyice Faster. Cornett, MD;  Location: [place];  Service: General;  Laterality: Left;  . TISSUE EXPANDER PLACEMENT Left 09/24/2013   Procedure: PLACEMENT OF TISSUE EXPANDER AND FLEX HD TO LEFT BREAST;  Surgeon: Theodoro Kos, DO;  Location: [place];  Service: Plastics;  Laterality: Left;    There were no vitals filed for this visit.   Subjective Assessment - 08/27/19 0759  Subjective  Pt reports tension HAs most of her life, but they have wrosened in the past year where she is working from home, helping her children with school, and starting to build a new home. 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 Pt reports her work involes being on a computer 8 hours a day. Tylenol, caffine, and sudafed can help reduce the HAs. Pt has started walking daily which she has found helpful to manage her stress. Pain is  located in the B shoulders to base of head. It can move to the front of her head, R greater than L and they can move to her forehead. Pt reports having breast reconstruction due to CA with a latissamus dorsi flap completed. Pt is not experiencing a HA today, but the dull the pain level can reach 10/10.    Pertinent History  r breast reconstruction c a latissimus dorsi flap completed.    How long can you sit comfortably?  Several hours    How long can you stand comfortably?  Several hours    How long can you walk comfortably?  no significant limitations    Patient Stated Goals  Relieve tesion in my shoulders and neck    Currently in Pain?  Yes   Approx 1 a week   Pain Score  10-Worst pain ever    Pain Location  Neck    Pain Orientation  Right;Left    Pain Descriptors / Indicators  Aching;Dull;Radiating;Tightness    Pain Type  Chronic pain    Pain Radiating Towards  Front of the head    Pain Onset  Other (comment)   The past yaer   Pain Frequency  Intermittent    Aggravating Factors   Prolonged time working on a computer; multiple life stressers    Pain Relieving Factors  tylenol, sudafed (nasal congestion), caffine    Effect of Pain on Daily Activities  Pt pushes through the pain and keeps going    Multiple Pain Sites  No         [facility] PT Assessment - 08/28/19 0001      Assessment   Medical Diagnosis  Tension HAs    Referring Provider (PT)  Forrest Moron, MD    Onset Date/Surgical Date  08/13/18    Hand Dominance  Right    Next MD Visit  09/11/19    Prior Therapy  No      Precautions   Precautions  None      Restrictions   Weight Bearing Restrictions  No      Balance Screen   Has the patient fallen in the past 6 months  No      [place] residence    Living Arrangements  Spouse/significant other;Children    Type of Home  House    Home Access  Level entry    Home Layout  One level    Home Equipment  None      Prior Function    Level of Independence  Independent      Cognition   Overall Cognitive Status  Within Functional Limits for tasks assessed      Sensation   Light Touch  Appears Intact      Coordination   Gross Motor Movements are Fluid and Coordinated  Yes      Posture/Postural Control   Posture/Postural Control  Postural limitations    Postural Limitations  Forward head  ROM / Strength   AROM / PROM / Strength  AROM      AROM   AROM Assessment Site  Cervical;Shoulder    Right/Left Shoulder  Right;Left   B shoulders WNLs c the R ROM min less than L   Cervical Flexion  Full  reports of upper cervical tightness    Cervical Extension  Full    Cervical - Right Side Bend  Full c report of L upper cervical tightness    Cervical - Left Side Bend  Full c report of R upper cervical tighness    Cervical - Right Rotation  Full c report of upper cervical tightness    Cervical - Left Rotation  Full with report of upper cervical tightness      Palpation   Palpation comment  upper trap TP R greater than L      Special Tests   Other special tests  Sharp-purser: Neg                Objective measurements completed on examination: See above findings.              PT Education - 08/28/19 0727    Education Details  Education of proper computer work station;  posture exs; use of a Engineer, maintenance (IT) for tight muscles and trigger points; sub-occipital muscle massage to address tightness.    Person(s) Educated  Patient    Methods  Explanation;Demonstration;Tactile cues;Verbal cues;Handout    Comprehension  Verbalized understanding;Returned demonstration;Verbal cues required;Tactile cues required;Need further instruction       PT Short Term Goals - 08/28/19 0744      PT SHORT TERM GOAL #1   Title  Pt will be Ind in an initial HEP to address posture and cervical/shoulder muscle tightness    Baseline  no program    Time  3    Period  Weeks    Status  New    Target Date  09/18/19       PT SHORT TERM GOAL #2   Title  Pt will voice understanding of a proper compuer set up to help reduce postural stress and HAs.    Baseline  decreased understanding    Time  3    Period  Weeks    Status  New    Target Date  09/18/19        PT Long Term Goals - 08/28/19 0747      PT LONG TERM GOAL #1   Title  Pt will be Ind in a final HEP to address posture and cervical/shoulder muscle tightness    Baseline  no program    Time  6    Period  Weeks    Status  New    Target Date  10/09/19      PT LONG TERM GOAL #2   Title  Improved pt's FOTO limitation score to 28%    Baseline  On eval 33%    Time  6    Period  Weeks    Status  New    Target Date  10/09/19      PT LONG TERM GOAL #3   Title  Pt will report a decrease in frequency of tension HAs from 4-6x per month to 2-3x per month    Baseline  4-6x per month    Time  6    Period  Weeks    Status  New    Target Date  10/09/19  PT LONG TERM GOAL #4   Title  Pt will report a decrease in the overall intensity of her tension HAs by 30%    Baseline  10/10 most of the time    Time  6    Period  Weeks    Status  New    Target Date  10/09/19             Plan - 08/28/19 0734    Clinical Impression Statement  Pt presents with signs and symptoms consistent with tension HAs. Contributing factors including prolonged work at a computer station with improper ergonomic set up, forward head posture, cervical and upper trap tightness c TPs, and multiple life stressers.    Personal Factors and Comorbidities  Comorbidity 1;Comorbidity 2    Comorbidities  LATISSMUS MYOCUTAEIOUS MUSCLE FLAP AND PLACEMENT OF TISSUE EXPLANDER; Migraine HAs    Stability/Clinical Decision Making  Evolving/Moderate complexity    Clinical Decision Making  Moderate    Rehab Potential  Good    PT Frequency  1x / week    PT Duration  6 weeks    PT Treatment/Interventions  Cryotherapy;Moist Heat;Iontophoresis 66m/ml Dexamethasone;Electrical  Stimulation;Neuromuscular re-education;Therapeutic exercise;Therapeutic activities;Patient/family education;Manual techniques;Dry needling;Spinal Manipulations;Joint Manipulations    PT Next Visit Plan  Review HEP, computer set up, and posture recommendation; Modalities and manual therapy as indicated    PT Home Exercise Plan  [id number]    Consulted and Agree with Plan of Care  Patient       Patient will benefit from skilled therapeutic intervention in order to improve the following deficits and impairments:  Decreased activity tolerance, Decreased knowledge of precautions, Increased muscle spasms, Improper body mechanics, Postural dysfunction, Pain  Visit Diagnosis: Chronic tension-type headache, intractable - Plan: PT plan of care cert/re-cert  Muscle spasm of back - Plan: PT plan of care cert/re-cert     Problem List Patient Active Problem List   Diagnosis Date Noted  . Breast asymmetry following reconstructive surgery 09/09/2015  . Status post bilateral breast reconstruction 12/20/2013  . Acquired absence of bilateral breasts and nipples 09/24/2013  . History of breast cancer 08/17/2013  . Anxiety 06/28/2013  . Eczema 06/28/2013  . Breast cancer ([place] 03/16/2013  . Malignant neoplasm of upper-inner quadrant of right breast in female, estrogen receptor positive ([facility] 06/20/2012  . Essential hypertension 05/02/2012  . Migraine 04/17/2012    AGar PontoMS, PT 08/28/19 8:05 AM  [facility], [place] [address]Phone: [phone]  Fax:  [phone] Name: Sherry HottensteinMRN: [id number] of Birth: 110/13/1972    PHYSICAL THERAPY DISCHARGE SUMMARY  Visits from Start of Care: 1  Current functional level related to goals / functional outcomes: Pt didn't return after evaluation   Remaining deficits: unknown   Education / Equipment: HEP,   Plan: Patient agrees to discharge.  Patient  goals were not met. Patient is being discharged due to not returning since the last visit.  ?????        Kristoffer Leamon PT, DPT, LAT, ATC  08/06/20  4:04 PM

## 2019-09-04 ENCOUNTER — Other Ambulatory Visit: Payer: Self-pay | Admitting: Family Medicine

## 2019-09-04 DIAGNOSIS — F4322 Adjustment disorder with anxiety: Secondary | ICD-10-CM

## 2019-09-04 NOTE — Telephone Encounter (Deleted)
Patient is requesting a refill of the following medications:  Requested Prescriptions   Pending Prescriptions Disp Refills  . escitalopram (LEXAPRO) 10 MG tablet [Pharmacy Med Name: ESCITALOPRAM 10 MG TABLET] 90 tablet 2    Sig: TAKE 1 TABLET BY MOUTH EVERY DAY    Date of patient request: 09/04/2019 Last office visit: 08/13/2019 Date of last refill: 08/13/2019 Last refill amount:  Follow up time period per chart: ***

## 2019-09-04 NOTE — Telephone Encounter (Signed)
Requested medication (s) are due for refill today: no  Requested medication (s) are on the active medication list:  yes  Last refill:  08/13/2019  Future visit scheduled: yes  Notes to clinic:  [place]. DX Code Needed.   Requested Prescriptions  Pending Prescriptions Disp Refills   escitalopram (LEXAPRO) 10 MG tablet [Pharmacy Med Name: ESCITALOPRAM 10 MG TABLET] 90 tablet 2    Sig: TAKE 1 TABLET BY MOUTH EVERY DAY      Psychiatry:  Antidepressants - SSRI Passed - 09/04/2019  9:30 AM      Passed - Valid encounter within last 6 months    Recent Outpatient Visits           3 weeks ago Adjustment disorder with anxious mood   Primary Care at [facility], Arlie Solomons, MD   1 year ago Flexural eczema   Primary Care at [facility], [place] A, MD   4 years ago Acute maxillary sinusitis, recurrence not specified   Primary Care at [facility], Thao P, DO   5 years ago Conjunctivitis, acute   [facility] at [facility], Janalee Dane, MD   5 years ago Sore throat   [facility] at [facility], MD       Future Appointments             In 1 week Forrest Moron, MD Primary Care at [place], [facility]

## 2019-09-11 ENCOUNTER — Ambulatory Visit: Payer: Federal, State, Local not specified - PPO | Admitting: Family Medicine

## 2019-09-11 ENCOUNTER — Encounter: Payer: Self-pay | Admitting: Family Medicine

## 2019-09-11 ENCOUNTER — Other Ambulatory Visit: Payer: Self-pay

## 2019-09-11 VITALS — BP 138/86 | HR 104 | Temp 98.2°F | Ht 62.5 in | Wt 144.6 lb

## 2019-09-11 DIAGNOSIS — F4322 Adjustment disorder with anxiety: Secondary | ICD-10-CM | POA: Diagnosis not present

## 2019-09-11 DIAGNOSIS — I10 Essential (primary) hypertension: Secondary | ICD-10-CM

## 2019-09-11 MED ORDER — CITALOPRAM HYDROBROMIDE 20 MG PO TABS: 20.0000 mg | ORAL_TABLET | Freq: Every day | ORAL | 3 refills | Status: DC

## 2019-09-11 MED ORDER — BUSPIRONE HCL 5 MG PO TABS: 5.0000 mg | ORAL_TABLET | Freq: Three times a day (TID) | ORAL | 3 refills | Status: DC

## 2019-09-11 NOTE — Progress Notes (Signed)
Established Patient Office Visit  Subjective:  Patient ID: Sherry Barry, female    DOB: 1970/12/26  Age: 49 y.o. MRN: [id number]  CC:  Chief Complaint  Patient presents with  . Follow-up    x1 month on Anxiety/stress    HPI Sherry Barry presents for   She reports that her job has worked itself out She states that she is feeling better about the work situation but the anxiety is still very high.  She denies side effects currently Initially felt abdominal pain that resolved.  GAD 7 : Generalized Anxiety Score 09/11/2019 08/13/2019  Nervous, Anxious, on Edge 3 3  Control/stop worrying 3 3  Worry too much - different things 3 3  Trouble relaxing 3 3  Restless 3 3  Easily annoyed or irritable 2 3  Afraid - awful might happen 1 3  Total GAD 7 Score 18 21  Anxiety Difficulty Not difficult at all Extremely difficult     Depression screen [facility] 2/9 09/11/2019 12/21/2017 01/18/2015  Decreased Interest 1 0 0  Down, Depressed, Hopeless 0 0 0  PHQ - 2 Score 1 0 0      Past Medical History:  Diagnosis Date  . Allergy   . Breast cancer ([place])   . Breast cancer ([place])    right  . History of chemotherapy    doxetaxel/carboplatin/trastuzumab  . History of migraine    last one about a week ago  . Hx of radiation therapy 01/01/13- 02/15/13   r chest wall, r supraclav/axilla 5040 cGy/28 sessions, scar boost 1000 cGy/5 sessions  . Hypertension    no meds,   urgent care on pomana  . Migraine   . Migraine     Past Surgical History:  Procedure Laterality Date  . ABDOMINAL HYSTERECTOMY     no salpingo-oophorectomy 2009  . BREAST RECONSTRUCTION WITH PLACEMENT OF TISSUE EXPANDER AND FLEX HD (ACELLULAR HYDRATED DERMIS) Left 09/24/2013  . LATISSIMUS FLAP TO BREAST Right 09/24/2013   Procedure: RIGHT LATISSMUS MYOCUTAEIOUS MUSCLE FLAP AND PLACEMENT OF TISSUE [name];  Surgeon: Theodoro Kos, DO;  Location: [place];  Service: Plastics;  Laterality: Right;  . LIPOSUCTION WITH  LIPOFILLING Bilateral 12/12/2013   Procedure: LIPOSUCTION WITH LIPOFILLING;  Surgeon: Theodoro Kos, DO;  Location: [place];  Service: Plastics;  Laterality: Bilateral;  . PORT-A-CATH REMOVAL Left 12/12/2013   Procedure: REMOVAL PORT-A-CATH;  Surgeon: Theodoro Kos, DO;  Location: [place];  Service: Plastics;  Laterality: Left;  . PORTACATH PLACEMENT  07/14/2012   Procedure: INSERTION PORT-A-CATH;  Surgeon: Joyice Faster. Cornett, MD;  Location: [place] [facility];  Service: General;  Laterality: Left;  . RECONSTRUCTION BREAST W/ LATISSIMUS DORSI FLAP Right 09/24/2013   "& tissue expander placement"  . REMOVAL OF BILATERAL TISSUE EXPANDERS WITH PLACEMENT OF BILATERAL BREAST IMPLANTS Bilateral 12/12/2013   Procedure: REMOVAL OF BILATERAL TISSUE EXPANDERS WITH PLACEMENT OF BILATERAL BREAST IMPLANTS/BILATERAL CAPSULECTOMIES WITH  LIPOFILLING FAT GRAFTING;  Surgeon: Theodoro Kos, DO;  Location: [place];  Service: Plastics;  Laterality: Bilateral;  . SIMPLE MASTECTOMY WITH AXILLARY SENTINEL NODE BIOPSY  07/14/2012   Procedure: SIMPLE MASTECTOMY WITH AXILLARY SENTINEL NODE BIOPSY;  Surgeon: Joyice Faster. Cornett, MD;  Location: [place];  Service: General;  Laterality: Right;  Bilateral simple mastectomy with port and right sebtibel lymph node mapping  . SIMPLE MASTECTOMY WITH AXILLARY SENTINEL NODE BIOPSY  07/14/2012   Procedure: SIMPLE MASTECTOMY;  Surgeon: Joyice Faster. Cornett, MD;  Location: [place];  Service: General;  Laterality:  Left;  . TISSUE EXPANDER PLACEMENT Left 09/24/2013   Procedure: PLACEMENT OF TISSUE EXPANDER AND FLEX HD TO LEFT BREAST;  Surgeon: Theodoro Kos, DO;  Location: [place];  Service: Plastics;  Laterality: Left;    Family History  Problem Relation Age of Onset  . Hypertension Mother   . Alcohol abuse Mother   . Heart disease Maternal Grandmother   . Stroke Maternal Grandfather   . Colon cancer Maternal Aunt 69       alive at 46  . Brain cancer Maternal  Uncle 41       and lymphoma in early 5s; deceased  . Brain cancer Maternal Uncle 60       deceased  . Pancreatic cancer Maternal Uncle 45       alive at 76  . Breast cancer Maternal Aunt        great aunt through mat GF; dx at ? age  . Breast cancer Cousin 79       mat 1st cousin once removed through mat GF ; deceased    Social History   Socioeconomic History  . Marital status: Married    Spouse name: Not on file  . Number of children: 2  . Years of education: Not on file  . Highest education level: Not on file  Occupational History    Employer: [employer]  Tobacco Use  . Smoking status: Never Smoker  . Smokeless tobacco: Never Used  Substance and Sexual Activity  . Alcohol use: Yes    Comment: occasional  . Drug use: No  . Sexual activity: Yes    Birth control/protection: Surgical  Other Topics Concern  . Not on file  Social History Narrative  . Not on file   Social Determinants of Health   Financial Resource Strain:   . Difficulty of Paying Living Expenses:   Food Insecurity:   . Worried About Charity fundraiser in the Last Year:   . Arboriculturist in the Last Year:   Transportation Needs:   . Film/video editor (Medical):   [name] Lack of Transportation (Non-Medical):   Physical Activity:   . Days of Exercise per Week:   . Minutes of Exercise per Session:   Stress:   . Feeling of Stress :   Social Connections:   . Frequency of Communication with Friends and Family:   . Frequency of Social Gatherings with Friends and Family:   . Attends Religious Services:   . Active Member of Clubs or Organizations:   . Attends Archivist Meetings:   [name] Marital Status:   Intimate Partner Violence:   . Fear of Current or Ex-Partner:   . Emotionally Abused:   [name] Physically Abused:   . Sexually Abused:     Outpatient Medications Prior to Visit  Medication Sig Dispense Refill  . escitalopram (LEXAPRO) 10 MG tablet Take 1 tablet (10 mg total) by mouth  daily. 30 tablet 3   No facility-administered medications prior to visit.    Allergies  Allergen Reactions  . Codeine Itching and Hives  . Latex Hives    ROS Review of Systems See hpi   Objective:    Physical Exam  BP (!) 154/109 (BP Location: Left Arm, Patient Position: Sitting, Cuff Size: Normal)   Pulse (!) 104   Temp 98.2 F (36.8 C) (Temporal)   Ht 5' 2.5" (1.588 m)   Wt 144 lb 9.6 oz (65.6 kg)   SpO2 99%  BMI 26.03 kg/m  Wt Readings from Last 3 Encounters:  09/11/19 144 lb 9.6 oz (65.6 kg)  08/13/19 147 lb (66.7 kg)  02/22/18 140 lb 4.8 oz (63.6 kg)   Physical Exam  Constitutional: Oriented to person, place, and time. Appears well-developed and well-nourished.  HENT:  Head: Normocephalic and atraumatic.  Eyes: Conjunctivae and EOM are normal.  Cardiovascular: Normal rate, regular rhythm, normal heart sounds and intact distal pulses.  No murmur heard. Pulmonary/Chest: Effort normal and breath sounds normal. No stridor. No respiratory distress. Has no wheezes.  Neurological: Is alert and oriented to person, place, and time.  Skin: Skin is warm. Capillary refill takes less than 2 seconds.  Psychiatric: Has a normal mood and affect. Behavior is normal. Judgment and thought content normal.     Health Maintenance Due  Topic Date Due  . HIV Screening  Never done  . TETANUS/TDAP  Never done    There are no preventive care reminders to display for this patient.  Lab Results  Component Value Date   TSH 2.160 05/02/2012   Lab Results  Component Value Date   WBC 5.5 02/22/2018   HGB 13.9 02/22/2018   HCT 40.1 02/22/2018   MCV 87.0 02/22/2018   PLT 174 02/22/2018   Lab Results  Component Value Date   NA 142 02/22/2018   K 3.6 02/22/2018   CHLORIDE 109 02/15/2017   CO2 26 02/22/2018   GLUCOSE 86 02/22/2018   BUN 11 02/22/2018   CREATININE 0.92 02/22/2018   BILITOT 0.4 02/22/2018   ALKPHOS 69 02/22/2018   AST 16 02/22/2018   ALT 10 02/22/2018    PROT 6.6 02/22/2018   ALBUMIN 3.7 02/22/2018   CALCIUM 9.1 02/22/2018   ANIONGAP 9 02/22/2018   EGFR >90 02/15/2017   Lab Results  Component Value Date   CHOL 196 12/21/2017   Lab Results  Component Value Date   HDL 65 12/21/2017   Lab Results  Component Value Date   LDLCALC 122 (H) 12/21/2017   Lab Results  Component Value Date   TRIG 45 12/21/2017   Lab Results  Component Value Date   CHOLHDL 3.0 12/21/2017   Lab Results  Component Value Date   HGBA1C 5.0 12/21/2017      Assessment & Plan:   Problem List Items Addressed This Visit      Cardiovascular and Mediastinum   Essential hypertension - Primary Patient's blood pressure is at goal of 139/89 or less. Condition is stable. Continue current medications and treatment plan. I recommend that you exercise for 30-45 minutes 5 days a week. I also recommend a balanced diet with fruits and vegetables every day, lean meats, and little fried foods. The DASH diet (you can find this online) is a good example of this.     Other Visit Diagnoses    Adjustment disorder with anxious mood    -  Increased celexa and buspar Discussed mood changes Discussed tapering    Relevant Medications   citalopram (CELEXA) 20 MG tablet   busPIRone (BUSPAR) 5 MG tablet      Meds ordered this encounter  Medications  . citalopram (CELEXA) 20 MG tablet    Sig: Take 1 tablet (20 mg total) by mouth daily.    Dispense:  30 tablet    Refill:  3  . busPIRone (BUSPAR) 5 MG tablet    Sig: Take 1 tablet (5 mg total) by mouth 3 (three) times daily.    Dispense:  30 tablet  Refill:  3    Follow-up: Return for as needed for physical exam .    Forrest Moron, MD

## 2019-09-11 NOTE — Patient Instructions (Addendum)
  Meditative Mind on YOUTUBE  For eczema Wash with luke warm water Using mild soap like dove white bar or cetaphil Do not exfoliate Take zyrtec or non-drowsy claritin Continue hydrocortisone as needed Use sunscreen when you go outside.    If you have lab work done today you will be contacted with your lab results within the next 2 weeks.  If you have not heard from [place] then please contact us. The fastest way to get your results is to register for My Chart.   IF you received an x-ray today, you will receive an invoice from [facility]. Please contact [facility] at [phone] with questions or concerns regarding your invoice.   IF you received labwork today, you will receive an invoice from [facility]. Please contact [facility] at [phone] with questions or concerns regarding your invoice.   Our billing staff will not be able to assist you with questions regarding bills from these companies.  You will be contacted with the lab results as soon as they are available. The fastest way to get your results is to activate your My Chart account. Instructions are located on the last page of this paperwork. If you have not heard from [place] regarding the results in 2 weeks, please contact this office.

## 2019-09-12 ENCOUNTER — Ambulatory Visit: Payer: Federal, State, Local not specified - PPO

## 2019-10-03 ENCOUNTER — Other Ambulatory Visit: Payer: Self-pay | Admitting: Family Medicine

## 2019-10-03 DIAGNOSIS — F4322 Adjustment disorder with anxiety: Secondary | ICD-10-CM

## 2019-10-03 NOTE — Telephone Encounter (Signed)
Patient is asking for a 90 day prescription  Patient is requesting a refill of the following medications: Requested Prescriptions   Pending Prescriptions Disp Refills  . citalopram (CELEXA) 20 MG tablet [Pharmacy Med Name: CITALOPRAM HBR 20 MG TABLET] 90 tablet 2    Sig: TAKE 1 TABLET BY MOUTH EVERY DAY    Date of patient request: 10/03/2019 Last office visit: 09/11/2019 Date of last refill: 09/11/2019 Last refill amount: 30 tablets  Follow up time period per chart: 10/30/2019

## 2019-10-03 NOTE — Telephone Encounter (Signed)
Pharmacy/patient requesting changes to original Rx-sent for review

## 2019-10-10 ENCOUNTER — Telehealth: Payer: Self-pay | Admitting: Family Medicine

## 2019-10-10 NOTE — Telephone Encounter (Signed)
Please order labs for physical  physical coming up on 11/02/2019

## 2019-10-11 ENCOUNTER — Other Ambulatory Visit: Payer: Self-pay

## 2019-10-11 DIAGNOSIS — Z13220 Encounter for screening for lipoid disorders: Secondary | ICD-10-CM

## 2019-10-11 DIAGNOSIS — Z1329 Encounter for screening for other suspected endocrine disorder: Secondary | ICD-10-CM

## 2019-10-11 DIAGNOSIS — Z13228 Encounter for screening for other metabolic disorders: Secondary | ICD-10-CM

## 2019-10-11 DIAGNOSIS — I10 Essential (primary) hypertension: Secondary | ICD-10-CM

## 2019-10-29 ENCOUNTER — Ambulatory Visit: Payer: Federal, State, Local not specified - PPO

## 2019-10-30 ENCOUNTER — Other Ambulatory Visit: Payer: Self-pay

## 2019-10-30 ENCOUNTER — Ambulatory Visit: Payer: Federal, State, Local not specified - PPO | Admitting: Family Medicine

## 2019-10-30 ENCOUNTER — Encounter: Payer: Self-pay | Admitting: Family Medicine

## 2019-10-30 VITALS — BP 144/90 | HR 95 | Temp 98.5°F | Ht 62.5 in | Wt 146.8 lb

## 2019-10-30 DIAGNOSIS — I10 Essential (primary) hypertension: Secondary | ICD-10-CM | POA: Diagnosis not present

## 2019-10-30 DIAGNOSIS — F4322 Adjustment disorder with anxiety: Secondary | ICD-10-CM | POA: Diagnosis not present

## 2019-10-30 DIAGNOSIS — Z853 Personal history of malignant neoplasm of breast: Secondary | ICD-10-CM

## 2019-10-30 DIAGNOSIS — G43709 Chronic migraine without aura, not intractable, without status migrainosus: Secondary | ICD-10-CM | POA: Diagnosis not present

## 2019-10-30 MED ORDER — CITALOPRAM HYDROBROMIDE 20 MG PO TABS: 20.0000 mg | ORAL_TABLET | Freq: Every day | ORAL | 2 refills | Status: DC

## 2019-10-30 NOTE — Progress Notes (Signed)
5/18/20219:53 AM  Sherry Barry 1971/01/31, 49 y.o., female [id number]  Chief Complaint  Patient presents with  . Establish Care    having migraines, would like referral for neuro, also told she has situational htn, not taking meds for yrs. Has not taken anxiety meds either, does ot prefer a daily regimen of medication    HPI:   Patient is a 49 y.o. female with past medical history significant for HTN, migraines, GAD, R breast cancer who presents today to establish care  Previous PCP Dr Nolon Rod Last OV march 2021 - changed lexapro to celexa and buspar, BP < 140/90  Anxiety - long standing. Many life stressors. celexa and buspar made her really tired and was having issues with concentrating at work so stopped taking them  H/o migraines for years, started in puberty Reports starts with itchy right eye, face and chest and then the next day starts having headache, right sided, throbbing,  No sparkling lights, dizziness, hearing or vision changes Mild nausea no vomiting Takes APAP, sudafed, claritin Gets about 2-3 x months, last 3 days Has seen allergist - told she was not allergic to anything Used to see Dr Domingo Cocking for migraines, lost to followup at time of breast cancer diagnosis, would like to go back Currently having a migraine  HTN - started to have issues when she was undergoing cancer treatment, usually really high at time of OV but then at recheck would be < 140/90, told she has white coat syndrome At one point given medication - but then BP dropped sign Cooks at home, does not monitor salt She does walk daily for exercise, tracks her steps Does not smoke   Right breast cancer - 2014, s/p bilateral mastectomy, chemo, radiation and 5 years of tamoxifen. Released in 2019. Needs yearly chest wall exam.  S/p hysterectomy WITHOUT BSO   Depression screen [facility] 2/9 10/30/2019 09/11/2019 12/21/2017  Decreased Interest 0 1 0  Down, Depressed, Hopeless 0 0 0  PHQ - 2  Score 0 1 0    Fall Risk  10/30/2019 09/11/2019 12/21/2017  Falls in the past year? 0 0 No  Number falls in past yr: 0 0 -  Injury with Fall? 0 0 -  Follow up - Falls evaluation completed -     Allergies  Allergen Reactions  . Codeine Itching and Hives  . Latex Hives    Prior to Admission medications   Medication Sig Start Date End Date Taking? Authorizing Provider  busPIRone (BUSPAR) 5 MG tablet Take 1 tablet (5 mg total) by mouth 3 (three) times daily. Patient not taking: Reported on 10/30/2019 09/11/19   Forrest Moron, MD  citalopram (CELEXA) 20 MG tablet TAKE 1 TABLET BY MOUTH EVERY DAY Patient not taking: Reported on 10/30/2019 10/04/19   Forrest Moron, MD    Past Medical History:  Diagnosis Date  . Allergy   . Breast cancer ([place])   . Breast cancer ([place])    right  . History of chemotherapy    doxetaxel/carboplatin/trastuzumab  . History of migraine    last one about a week ago  . Hx of radiation therapy 01/01/13- 02/15/13   r chest wall, r supraclav/axilla 5040 cGy/28 sessions, scar boost 1000 cGy/5 sessions  . Hypertension    no meds,   urgent care on pomana  . Migraine   . Migraine     Past Surgical History:  Procedure Laterality Date  . ABDOMINAL HYSTERECTOMY     no salpingo-oophorectomy 2009  .  BREAST RECONSTRUCTION WITH PLACEMENT OF TISSUE EXPANDER AND FLEX HD (ACELLULAR HYDRATED DERMIS) Left 09/24/2013  . LATISSIMUS FLAP TO BREAST Right 09/24/2013   Procedure: RIGHT LATISSMUS MYOCUTAEIOUS MUSCLE FLAP AND PLACEMENT OF TISSUE [name];  Surgeon: Theodoro Kos, DO;  Location: [place];  Service: Plastics;  Laterality: Right;  . LIPOSUCTION WITH LIPOFILLING Bilateral 12/12/2013   Procedure: LIPOSUCTION WITH LIPOFILLING;  Surgeon: Theodoro Kos, DO;  Location: [place];  Service: Plastics;  Laterality: Bilateral;  . PORT-A-CATH REMOVAL Left 12/12/2013   Procedure: REMOVAL PORT-A-CATH;  Surgeon: Theodoro Kos, DO;  Location: [place] [facility];   Service: Plastics;  Laterality: Left;  . PORTACATH PLACEMENT  07/14/2012   Procedure: INSERTION PORT-A-CATH;  Surgeon: Joyice Faster. Cornett, MD;  Location: [facility];  Service: General;  Laterality: Left;  . RECONSTRUCTION BREAST W/ LATISSIMUS DORSI FLAP Right 09/24/2013   "& tissue expander placement"  . REMOVAL OF BILATERAL TISSUE EXPANDERS WITH PLACEMENT OF BILATERAL BREAST IMPLANTS Bilateral 12/12/2013   Procedure: REMOVAL OF BILATERAL TISSUE EXPANDERS WITH PLACEMENT OF BILATERAL BREAST IMPLANTS/BILATERAL CAPSULECTOMIES WITH  LIPOFILLING FAT GRAFTING;  Surgeon: Theodoro Kos, DO;  Location: [place];  Service: Plastics;  Laterality: Bilateral;  . SIMPLE MASTECTOMY WITH AXILLARY SENTINEL NODE BIOPSY  07/14/2012   Procedure: SIMPLE MASTECTOMY WITH AXILLARY SENTINEL NODE BIOPSY;  Surgeon: Joyice Faster. Cornett, MD;  Location: [facility];  Service: General;  Laterality: Right;  Bilateral simple mastectomy with port and right sebtibel lymph node mapping  . SIMPLE MASTECTOMY WITH AXILLARY SENTINEL NODE BIOPSY  07/14/2012   Procedure: SIMPLE MASTECTOMY;  Surgeon: Joyice Faster. Cornett, MD;  Location: [place];  Service: General;  Laterality: Left;  . TISSUE EXPANDER PLACEMENT Left 09/24/2013   Procedure: PLACEMENT OF TISSUE EXPANDER AND FLEX HD TO LEFT BREAST;  Surgeon: Theodoro Kos, DO;  Location: [facility];  Service: Plastics;  Laterality: Left;    Social History   Tobacco Use  . Smoking status: Never Smoker  . Smokeless tobacco: Never Used  Substance Use Topics  . Alcohol use: Yes    Comment: occasional    Family History  Problem Relation Age of Onset  . Hypertension Mother   . Alcohol abuse Mother   . Heart disease Maternal Grandmother   . Stroke Maternal Grandfather   . Colon cancer Maternal Aunt 55       alive at 63  . Brain cancer Maternal Uncle 30       and lymphoma in early 26s; deceased  . Brain cancer Maternal Uncle 60       deceased  . Pancreatic cancer Maternal Uncle 45       alive at  5  . Breast cancer Cousin 47       mat 1st cousin once removed through mat GF ; deceased  . Breast cancer Maternal Aunt        great aunt through mat GF; dx at ? age    Review of Systems  Constitutional: Negative for chills and fever.  Respiratory: Negative for cough and shortness of breath.   Cardiovascular: Negative for chest pain, palpitations and leg swelling.  Gastrointestinal: Negative for abdominal pain, nausea and vomiting.   Per hpi  OBJECTIVE:  Today's Vitals   10/30/19 0944 10/30/19 1024  BP: (!) 159/120 (!) 144/90  Pulse: 95   Temp: 98.5 F (36.9 C)   SpO2: 98%   Weight: 146 lb 12.8 oz (66.6 kg)   Height: 5' 2.5" (1.588 m)    Body mass index  is 26.42 kg/m.  BP Readings from Last 3 Encounters:  10/30/19 (!) 159/120  09/11/19 138/86  08/13/19 138/86    Physical Exam Vitals and nursing note reviewed.  Constitutional:      Appearance: She is well-developed.  HENT:     Head: Normocephalic and atraumatic.     Mouth/Throat:     Pharynx: No oropharyngeal exudate.  Eyes:     General: No scleral icterus.    Conjunctiva/sclera: Conjunctivae normal.     Pupils: Pupils are equal, round, and reactive to light.  Neck:     Vascular: No carotid bruit.  Cardiovascular:     Rate and Rhythm: Normal rate and regular rhythm.     Pulses: Normal pulses.     Heart sounds: Normal heart sounds. No murmur. No friction rub. No gallop.   Pulmonary:     Effort: Pulmonary effort is normal.     Breath sounds: Normal breath sounds. No wheezing, rhonchi or rales.  Musculoskeletal:     Cervical back: Neck supple.     Right lower leg: No edema.     Left lower leg: No edema.  Skin:    General: Skin is warm and dry.  Neurological:     Mental Status: She is alert and oriented to person, place, and time.     Gait: Gait normal.     No results found for this or any previous visit (from the past 24 hour(s)).  No results found.   ASSESSMENT and PLAN  1. Essential  hypertension Above goal. Question white coat syndrome. Discussed low salt diet. Discussed home BP monitoring. Review data at next OV. RTC precautions reviewed. - TSH - CBC - Urinalysis, Routine w reflex microscopic - Comprehensive metabolic panel  2. History of breast cancer In remission, needs yearly chest wall exam, will do at next CPE  3. Chronic migraine without aura without status migrainosus, not intractable - AMB referral to headache clinic  4. Adjustment disorder with anxious mood Discussed restarting/titration of SSRI. Consider counseling. - citalopram (CELEXA) 20 MG tablet; Take 1 tablet (20 mg total) by mouth daily.  PMH, PSH, meds, allergies, Fhx, Shx, reviewed with patient today.  Return in about 4 weeks (around 11/27/2019).    Rutherford Guys, MD Primary Care at [place], [place] [address] Ph.  [phone] Fax [phone]

## 2019-10-30 NOTE — Patient Instructions (Addendum)
  Start celexa at 10mg  (1/2 tab daily). After 2 weeks if needed increase to 1 tab (20mg ) daily  Please limit salt intake to no more than 1500 mg a day Consider getting an automatic arm BP cuff and check BP once a day at same time, bring readings and BP machine to next OV  If you have lab work done today you will be contacted with your lab results within the next 2 weeks.  If you have not heard from [place] then please contact us. The fastest way to get your results is to register for My Chart.   IF you received an x-ray today, you will receive an invoice from [facility]. Please contact [facility] at [phone] with questions or concerns regarding your invoice.   IF you received labwork today, you will receive an invoice from [facility]. Please contact [facility] at [phone] with questions or concerns regarding your invoice.   Our billing staff will not be able to assist you with questions regarding bills from these companies.  You will be contacted with the lab results as soon as they are available. The fastest way to get your results is to activate your My Chart account. Instructions are located on the last page of this paperwork. If you have not heard from [place] regarding the results in 2 weeks, please contact this office.

## 2019-10-31 LAB — CBC
Hematocrit: 46.1 % (ref 34.0–46.6)
Hemoglobin: 15.9 g/dL (ref 11.1–15.9)
MCH: 30.3 pg (ref 26.6–33.0)
MCHC: 34.5 g/dL (ref 31.5–35.7)
MCV: 88 fL (ref 79–97)
Platelets: 245 10*3/uL (ref 150–450)
RBC: 5.25 x10E6/uL (ref 3.77–5.28)
RDW: 12.5 % (ref 11.7–15.4)
WBC: 6.1 10*3/uL (ref 3.4–10.8)

## 2019-10-31 LAB — COMPREHENSIVE METABOLIC PANEL
ALT: 13 IU/L (ref 0–32)
AST: 17 IU/L (ref 0–40)
Albumin/Globulin Ratio: 1.7 (ref 1.2–2.2)
Albumin: 4.2 g/dL (ref 3.8–4.8)
Alkaline Phosphatase: 79 IU/L (ref 48–121)
BUN/Creatinine Ratio: 14 (ref 9–23)
BUN: 11 mg/dL (ref 6–24)
Bilirubin Total: 0.5 mg/dL (ref 0.0–1.2)
CO2: 21 mmol/L (ref 20–29)
Calcium: 9.3 mg/dL (ref 8.7–10.2)
Chloride: 104 mmol/L (ref 96–106)
Creatinine, Ser: 0.81 mg/dL (ref 0.57–1.00)
GFR calc Af Amer: 100 mL/min/{1.73_m2} (ref >59)
GFR calc non Af Amer: 87 mL/min/{1.73_m2} (ref >59)
Globulin, Total: 2.5 g/dL (ref 1.5–4.5)
Glucose: 81 mg/dL (ref 65–99)
Potassium: 3.8 mmol/L (ref 3.5–5.2)
Sodium: 140 mmol/L (ref 134–144)
Total Protein: 6.7 g/dL (ref 6.0–8.5)

## 2019-10-31 LAB — TSH: TSH: 1.57 u[IU]/mL (ref 0.450–4.500)

## 2019-11-02 ENCOUNTER — Encounter: Payer: Federal, State, Local not specified - PPO | Admitting: Family Medicine

## 2019-11-15 DIAGNOSIS — Z049 Encounter for examination and observation for unspecified reason: Secondary | ICD-10-CM | POA: Diagnosis not present

## 2019-11-15 DIAGNOSIS — Z79899 Other long term (current) drug therapy: Secondary | ICD-10-CM | POA: Diagnosis not present

## 2019-11-15 DIAGNOSIS — G43839 Menstrual migraine, intractable, without status migrainosus: Secondary | ICD-10-CM | POA: Diagnosis not present

## 2019-11-15 DIAGNOSIS — G43719 Chronic migraine without aura, intractable, without status migrainosus: Secondary | ICD-10-CM | POA: Diagnosis not present

## 2019-11-20 DIAGNOSIS — M791 Myalgia, unspecified site: Secondary | ICD-10-CM | POA: Diagnosis not present

## 2019-11-20 DIAGNOSIS — M542 Cervicalgia: Secondary | ICD-10-CM | POA: Diagnosis not present

## 2019-11-20 DIAGNOSIS — G43839 Menstrual migraine, intractable, without status migrainosus: Secondary | ICD-10-CM | POA: Diagnosis not present

## 2019-11-20 DIAGNOSIS — G43719 Chronic migraine without aura, intractable, without status migrainosus: Secondary | ICD-10-CM | POA: Diagnosis not present

## 2019-11-20 DIAGNOSIS — G518 Other disorders of facial nerve: Secondary | ICD-10-CM | POA: Diagnosis not present

## 2019-11-27 ENCOUNTER — Ambulatory Visit: Payer: Federal, State, Local not specified - PPO | Admitting: Family Medicine

## 2019-12-25 ENCOUNTER — Ambulatory Visit: Payer: Federal, State, Local not specified - PPO | Admitting: Family Medicine

## 2020-06-23 DIAGNOSIS — L2084 Intrinsic (allergic) eczema: Secondary | ICD-10-CM | POA: Diagnosis not present

## 2020-07-07 DIAGNOSIS — L2084 Intrinsic (allergic) eczema: Secondary | ICD-10-CM | POA: Diagnosis not present

## 2020-10-06 DIAGNOSIS — L905 Scar conditions and fibrosis of skin: Secondary | ICD-10-CM | POA: Diagnosis not present

## 2020-10-06 DIAGNOSIS — L2084 Intrinsic (allergic) eczema: Secondary | ICD-10-CM | POA: Diagnosis not present

## 2020-12-31 DIAGNOSIS — I10 Essential (primary) hypertension: Secondary | ICD-10-CM | POA: Diagnosis not present

## 2021-01-12 DIAGNOSIS — I10 Essential (primary) hypertension: Secondary | ICD-10-CM | POA: Diagnosis not present

## 2021-01-12 DIAGNOSIS — Z0001 Encounter for general adult medical examination with abnormal findings: Secondary | ICD-10-CM | POA: Diagnosis not present

## 2021-01-12 DIAGNOSIS — F419 Anxiety disorder, unspecified: Secondary | ICD-10-CM | POA: Diagnosis not present

## 2021-02-02 DIAGNOSIS — F4321 Adjustment disorder with depressed mood: Secondary | ICD-10-CM | POA: Diagnosis not present

## 2021-02-23 DIAGNOSIS — F4321 Adjustment disorder with depressed mood: Secondary | ICD-10-CM | POA: Diagnosis not present

## 2021-03-10 DIAGNOSIS — F4321 Adjustment disorder with depressed mood: Secondary | ICD-10-CM | POA: Diagnosis not present

## 2021-03-23 DIAGNOSIS — F4321 Adjustment disorder with depressed mood: Secondary | ICD-10-CM | POA: Diagnosis not present

## 2021-04-06 DIAGNOSIS — F4321 Adjustment disorder with depressed mood: Secondary | ICD-10-CM | POA: Diagnosis not present

## 2021-04-15 DIAGNOSIS — I10 Essential (primary) hypertension: Secondary | ICD-10-CM | POA: Diagnosis not present

## 2021-06-26 DIAGNOSIS — C50911 Malignant neoplasm of unspecified site of right female breast: Secondary | ICD-10-CM | POA: Diagnosis not present

## 2021-07-02 DIAGNOSIS — R5383 Other fatigue: Secondary | ICD-10-CM | POA: Diagnosis not present

## 2021-07-02 DIAGNOSIS — R058 Other specified cough: Secondary | ICD-10-CM | POA: Diagnosis not present

## 2021-07-02 DIAGNOSIS — T464X5A Adverse effect of angiotensin-converting-enzyme inhibitors, initial encounter: Secondary | ICD-10-CM | POA: Diagnosis not present

## 2021-07-02 DIAGNOSIS — R42 Dizziness and giddiness: Secondary | ICD-10-CM | POA: Diagnosis not present

## 2021-07-24 ENCOUNTER — Other Ambulatory Visit: Payer: Self-pay | Admitting: Family Medicine

## 2021-07-24 ENCOUNTER — Ambulatory Visit
Admission: RE | Admit: 2021-07-24 | Discharge: 2021-07-24 | Disposition: A | Discharge status: Home / Self Care | Payer: Federal, State, Local not specified - PPO | Source: Ambulatory Visit | Attending: Family Medicine | Admitting: Family Medicine

## 2021-07-24 DIAGNOSIS — R059 Cough, unspecified: Secondary | ICD-10-CM | POA: Diagnosis not present

## 2021-07-24 DIAGNOSIS — R5383 Other fatigue: Secondary | ICD-10-CM | POA: Diagnosis not present

## 2021-07-24 DIAGNOSIS — I10 Essential (primary) hypertension: Secondary | ICD-10-CM | POA: Diagnosis not present

## 2021-07-24 DIAGNOSIS — R0789 Other chest pain: Secondary | ICD-10-CM | POA: Diagnosis not present

## 2021-08-03 DIAGNOSIS — J189 Pneumonia, unspecified organism: Secondary | ICD-10-CM | POA: Diagnosis not present

## 2021-08-03 DIAGNOSIS — R5383 Other fatigue: Secondary | ICD-10-CM | POA: Diagnosis not present

## 2021-08-10 ENCOUNTER — Emergency Department (HOSPITAL_COMMUNITY): Payer: Federal, State, Local not specified - PPO

## 2021-08-10 ENCOUNTER — Encounter (HOSPITAL_COMMUNITY): Payer: Self-pay | Admitting: Emergency Medicine

## 2021-08-10 ENCOUNTER — Other Ambulatory Visit: Payer: Self-pay

## 2021-08-10 ENCOUNTER — Inpatient Hospital Stay (HOSPITAL_COMMUNITY)
Admission: EM | Admit: 2021-08-10 | Discharge: 2021-08-15 | DRG: 194 | Disposition: A | Discharge status: Home / Self Care | Payer: Federal, State, Local not specified - PPO | Attending: Internal Medicine | Admitting: Internal Medicine

## 2021-08-10 DIAGNOSIS — Z79899 Other long term (current) drug therapy: Secondary | ICD-10-CM

## 2021-08-10 DIAGNOSIS — J189 Pneumonia, unspecified organism: Principal | ICD-10-CM | POA: Diagnosis present

## 2021-08-10 DIAGNOSIS — Z853 Personal history of malignant neoplasm of breast: Secondary | ICD-10-CM | POA: Diagnosis not present

## 2021-08-10 DIAGNOSIS — C50919 Malignant neoplasm of unspecified site of unspecified female breast: Secondary | ICD-10-CM | POA: Diagnosis not present

## 2021-08-10 DIAGNOSIS — J90 Pleural effusion, not elsewhere classified: Secondary | ICD-10-CM

## 2021-08-10 DIAGNOSIS — Z885 Allergy status to narcotic agent status: Secondary | ICD-10-CM | POA: Diagnosis not present

## 2021-08-10 DIAGNOSIS — Z888 Allergy status to other drugs, medicaments and biological substances status: Secondary | ICD-10-CM | POA: Diagnosis not present

## 2021-08-10 DIAGNOSIS — Z91040 Latex allergy status: Secondary | ICD-10-CM

## 2021-08-10 DIAGNOSIS — L309 Dermatitis, unspecified: Secondary | ICD-10-CM | POA: Diagnosis not present

## 2021-08-10 DIAGNOSIS — L2082 Flexural eczema: Secondary | ICD-10-CM | POA: Diagnosis not present

## 2021-08-10 DIAGNOSIS — J910 Malignant pleural effusion: Secondary | ICD-10-CM | POA: Diagnosis present

## 2021-08-10 DIAGNOSIS — Z803 Family history of malignant neoplasm of breast: Secondary | ICD-10-CM | POA: Diagnosis not present

## 2021-08-10 DIAGNOSIS — Z20822 Contact with and (suspected) exposure to covid-19: Secondary | ICD-10-CM | POA: Diagnosis not present

## 2021-08-10 DIAGNOSIS — R0602 Shortness of breath: Secondary | ICD-10-CM | POA: Diagnosis not present

## 2021-08-10 DIAGNOSIS — Z808 Family history of malignant neoplasm of other organs or systems: Secondary | ICD-10-CM

## 2021-08-10 DIAGNOSIS — C7951 Secondary malignant neoplasm of bone: Secondary | ICD-10-CM | POA: Diagnosis not present

## 2021-08-10 DIAGNOSIS — Z811 Family history of alcohol abuse and dependence: Secondary | ICD-10-CM | POA: Diagnosis not present

## 2021-08-10 DIAGNOSIS — Z807 Family history of other malignant neoplasms of lymphoid, hematopoietic and related tissues: Secondary | ICD-10-CM | POA: Diagnosis not present

## 2021-08-10 DIAGNOSIS — R5383 Other fatigue: Secondary | ICD-10-CM | POA: Diagnosis not present

## 2021-08-10 DIAGNOSIS — Z9889 Other specified postprocedural states: Secondary | ICD-10-CM

## 2021-08-10 DIAGNOSIS — Z8249 Family history of ischemic heart disease and other diseases of the circulatory system: Secondary | ICD-10-CM

## 2021-08-10 DIAGNOSIS — Z823 Family history of stroke: Secondary | ICD-10-CM

## 2021-08-10 DIAGNOSIS — R051 Acute cough: Secondary | ICD-10-CM | POA: Diagnosis not present

## 2021-08-10 DIAGNOSIS — I10 Essential (primary) hypertension: Secondary | ICD-10-CM | POA: Diagnosis present

## 2021-08-10 DIAGNOSIS — Z9221 Personal history of antineoplastic chemotherapy: Secondary | ICD-10-CM | POA: Diagnosis not present

## 2021-08-10 DIAGNOSIS — Z923 Personal history of irradiation: Secondary | ICD-10-CM | POA: Diagnosis not present

## 2021-08-10 DIAGNOSIS — Z800 Family history of malignant neoplasm of digestive organs: Secondary | ICD-10-CM | POA: Diagnosis not present

## 2021-08-10 LAB — BASIC METABOLIC PANEL
Anion gap: 11 (ref 5–15)
BUN: 13 mg/dL (ref 6–20)
CO2: 26 mmol/L (ref 22–32)
Calcium: 9.5 mg/dL (ref 8.9–10.3)
Chloride: 106 mmol/L (ref 98–111)
Creatinine, Ser: 0.75 mg/dL (ref 0.44–1.00)
GFR, Estimated: >60 mL/min (ref >60)
Glucose, Bld: 133 mg/dL — ABNORMAL HIGH (ref 70–99)
Potassium: 3.6 mmol/L (ref 3.5–5.1)
Sodium: 143 mmol/L (ref 135–145)

## 2021-08-10 LAB — CBC WITH DIFFERENTIAL/PLATELET
Abs Immature Granulocytes: 0.11 10*3/uL — ABNORMAL HIGH (ref 0.00–0.07)
Basophils Absolute: 0 10*3/uL (ref 0.0–0.1)
Basophils Relative: 0 %
Eosinophils Absolute: 0 10*3/uL (ref 0.0–0.5)
Eosinophils Relative: 0 %
HCT: 49 % — ABNORMAL HIGH (ref 36.0–46.0)
Hemoglobin: 17 g/dL — ABNORMAL HIGH (ref 12.0–15.0)
Immature Granulocytes: 1 %
Lymphocytes Relative: 6 %
Lymphs Abs: 0.8 10*3/uL (ref 0.7–4.0)
MCH: 29.8 pg (ref 26.0–34.0)
MCHC: 34.7 g/dL (ref 30.0–36.0)
MCV: 86 fL (ref 80.0–100.0)
Monocytes Absolute: 0.2 10*3/uL (ref 0.1–1.0)
Monocytes Relative: 2 %
Neutro Abs: 12.1 10*3/uL — ABNORMAL HIGH (ref 1.7–7.7)
Neutrophils Relative %: 91 %
Platelets: 346 10*3/uL (ref 150–400)
RBC: 5.7 MIL/uL — ABNORMAL HIGH (ref 3.87–5.11)
RDW: 12.5 % (ref 11.5–15.5)
WBC: 13.3 10*3/uL — ABNORMAL HIGH (ref 4.0–10.5)
nRBC: 0 % (ref 0.0–0.2)

## 2021-08-10 LAB — URINALYSIS, ROUTINE W REFLEX MICROSCOPIC
Bilirubin Urine: NEGATIVE
Glucose, UA: NEGATIVE mg/dL
Hgb urine dipstick: NEGATIVE
Ketones, ur: NEGATIVE mg/dL
Leukocytes,Ua: NEGATIVE
Nitrite: NEGATIVE
Protein, ur: NEGATIVE mg/dL
Specific Gravity, Urine: 1.012 (ref 1.005–1.030)
pH: 7 (ref 5.0–8.0)

## 2021-08-10 LAB — RESP PANEL BY RT-PCR (FLU A&B, COVID) ARPGX2
Influenza A by PCR: NEGATIVE
Influenza B by PCR: NEGATIVE
SARS Coronavirus 2 by RT PCR: NEGATIVE

## 2021-08-10 MED ORDER — SODIUM CHLORIDE 0.9 % IV SOLN
2.0000 g | Freq: Once | INTRAVENOUS | Status: AC
  Administered 2021-08-10: 2 g via INTRAVENOUS
  Filled 2021-08-10: qty 2

## 2021-08-10 MED ORDER — VANCOMYCIN HCL 1500 MG/300ML IV SOLN
1500.0000 mg | Freq: Once | INTRAVENOUS | Status: AC
Start: 2021-08-10 — End: 2021-08-11
  Administered 2021-08-10: 1500 mg via INTRAVENOUS
  Filled 2021-08-10: qty 300

## 2021-08-10 MED ORDER — IOHEXOL 350 MG/ML SOLN
54.0000 mL | Freq: Once | INTRAVENOUS | Status: AC | PRN
  Administered 2021-08-10: 54 mL via INTRAVENOUS

## 2021-08-10 MED ORDER — VANCOMYCIN HCL 750 MG/150ML IV SOLN
750.0000 mg | Freq: Two times a day (BID) | INTRAVENOUS | Status: DC
  Administered 2021-08-11 – 2021-08-13 (×6): 750 mg via INTRAVENOUS
  Filled 2021-08-10 (×9): qty 150

## 2021-08-10 NOTE — Assessment & Plan Note (Signed)
Sherry Barry is admitted to Med-Surg floor.  Started on Cefepime and Vancomycin for pneumonia that has failed outpatient antibiotic regimen.  Concern for atypical vs fungal infection since pt is immunocompromised with biologic therapy of Dupixent for Eczema Supplemental oxygen as needed to keep O2 sat 92-96% Incentive spirometer every 2 hours while awake Check CBC, BMP in am

## 2021-08-10 NOTE — Assessment & Plan Note (Signed)
Has been on biologic therapy but stopped it a few weeks ago.

## 2021-08-10 NOTE — ED Triage Notes (Signed)
Pt sent by dr, complaint of pneumonia since 2/10 and is not improving.

## 2021-08-10 NOTE — H&P (Signed)
History and Physical    Patient: Sherry Barry DOB: Jul 09, 1970 DOA: 08/10/2021 DOS: the patient was seen and examined on 08/10/2021 PCP: [facility]  Patient coming from: Home  Chief Complaint:  Chief Complaint  Patient presents with   Pneumonia    HPI: Sherry Barry is a 51 y.o. female with medical history significant of HTN, eczema ,hx of breast cancer 9 years ago who presents for evaluation of worsening dyspnea cough and left-sided chest pain.  She was recently treated for pneumonia by her primary physician.  She completed a course of azithromycin and amoxicillin but continued to have cough and was then given a course of doxycycline and Tessalon Perles which did not help.  She has a persistent cough that is nonproductive.  She has left-sided pleuritic lateral chest wall pain with coughing.  She has had shortness of breath with exertion.  She denies fevers or chills.  She has not had any confusion, urinary symptoms, abdominal pain, nausea vomiting or diarrhea.  No known sick contacts at home.  She has been treated with Dupixent for her eczema but she stopped taking it few weeks ago as she knew it could weaken her immune system. Patient continues to have pneumonia on chest x-ray and CT scan and underlying mass or abscess cannot be excluded.  She also has a pleural effusion.  She was started on vancomycin and cefepime in the emergency room and hospitalist service been asked to admit for further management Lives with her husband.  Denies tobacco or illicit drug use.  Drinks occasional alcoholic drinks socially  Review of Systems: As mentioned in the history of present illness. All other systems reviewed and are negative. Past Medical History:  Diagnosis Date   Allergy    Breast cancer ([place])    Breast cancer ([place])    right   History of chemotherapy    doxetaxel/carboplatin/trastuzumab   History of migraine    last one about a week  ago   Hx of radiation therapy 01/01/13- 02/15/13   r chest wall, r supraclav/axilla 5040 cGy/28 sessions, scar boost 1000 cGy/5 sessions   Hypertension    no meds,   urgent care on pomana   Migraine    Migraine    Past Surgical History:  Procedure Laterality Date   ABDOMINAL HYSTERECTOMY     no salpingo-oophorectomy 2009   BREAST RECONSTRUCTION WITH PLACEMENT OF TISSUE EXPANDER AND FLEX HD (ACELLULAR HYDRATED DERMIS) Left 09/24/2013   LATISSIMUS FLAP TO BREAST Right 09/24/2013   Procedure: RIGHT LATISSMUS MYOCUTAEIOUS MUSCLE FLAP AND PLACEMENT OF TISSUE EXPLANDER;  Surgeon: Theodoro Kos, DO;  Location: [facility];  Service: Plastics;  Laterality: Right;   LIPOSUCTION WITH LIPOFILLING Bilateral 12/12/2013   Procedure: LIPOSUCTION WITH LIPOFILLING;  Surgeon: Theodoro Kos, DO;  Location: [facility] [place];  Service: Plastics;  Laterality: Bilateral;   PORT-A-CATH REMOVAL Left 12/12/2013   Procedure: REMOVAL PORT-A-CATH;  Surgeon: Theodoro Kos, DO;  Location: [facility];  Service: Plastics;  Laterality: Left;   PORTACATH PLACEMENT  07/14/2012   Procedure: INSERTION PORT-A-CATH;  Surgeon: Joyice Faster. Cornett, MD;  Location: [facility];  Service: General;  Laterality: Left;   RECONSTRUCTION BREAST W/ LATISSIMUS DORSI FLAP Right 09/24/2013   "& tissue expander placement"   REMOVAL OF BILATERAL TISSUE EXPANDERS WITH PLACEMENT OF BILATERAL BREAST IMPLANTS Bilateral 12/12/2013   Procedure: REMOVAL OF BILATERAL TISSUE EXPANDERS WITH PLACEMENT OF BILATERAL BREAST IMPLANTS/BILATERAL CAPSULECTOMIES WITH  LIPOFILLING FAT GRAFTING;  Surgeon: Theodoro Kos,  DO;  Location: [place];  Service: Plastics;  Laterality: Bilateral;   SIMPLE MASTECTOMY WITH AXILLARY SENTINEL NODE BIOPSY  07/14/2012   Procedure: SIMPLE MASTECTOMY WITH AXILLARY SENTINEL NODE BIOPSY;  Surgeon: Joyice Faster. Cornett, MD;  Location: [place];  Service: General;  Laterality: Right;  Bilateral simple mastectomy with port and  right sebtibel lymph node mapping   SIMPLE MASTECTOMY WITH AXILLARY SENTINEL NODE BIOPSY  07/14/2012   Procedure: SIMPLE MASTECTOMY;  Surgeon: Joyice Faster. Cornett, MD;  Location: [facility];  Service: General;  Laterality: Left;   TISSUE EXPANDER PLACEMENT Left 09/24/2013   Procedure: PLACEMENT OF TISSUE EXPANDER AND FLEX HD TO LEFT BREAST;  Surgeon: Theodoro Kos, DO;  Location: [facility];  Service: Plastics;  Laterality: Left;   Social History:  reports that she has never smoked. She has never used smokeless tobacco. She reports current alcohol use. She reports that she does not use drugs.  Allergies  Allergen Reactions   Codeine Itching and Hives   Latex Hives   Lisinopril     Family History  Problem Relation Age of Onset   Hypertension Mother    Alcohol abuse Mother    Heart disease Maternal Grandmother    Stroke Maternal Grandfather    Colon cancer Maternal Aunt 55       alive at 55   Brain cancer Maternal Uncle 19       and lymphoma in early 34s; deceased   Brain cancer Maternal Uncle 60       deceased   Pancreatic cancer Maternal Uncle 11       alive at 22   Breast cancer Cousin 60       mat 1st cousin once removed through mat GF ; deceased   Breast cancer Maternal Aunt        great aunt through mat GF; dx at ? age    Prior to Admission medications   Medication Sig Start Date End Date Taking? Authorizing Provider  citalopram (CELEXA) 20 MG tablet Take 1 tablet (20 mg total) by mouth daily. 10/30/19   Daleen Squibb, MD    Physical Exam: Vitals:   08/10/21 1248  BP: (!) 143/118  Pulse: (!) 115  Resp: 18  Temp: 98.6 F (37 C)  TempSrc: Oral  SpO2: 97%   General: WDWN, Alert and oriented x3.  Eyes: EOMI, PERRL, conjunctivae normal.  Sclera nonicteric Neck: Soft, normal range of motion, supple, no masses, Trachea midline Respiratory: Diminished breath sounds in left lower lobe.  Otherwise lung fields are clear.  No wheezing, no crackles. Normal respiratory effort.   Cardiovascular: Regular rate and rhythm, no murmurs / rubs / gallops. No extremity edema. Abdomen: Soft, no tenderness, nondistended, no rebound or guarding. No masses palpated. Bowel sounds normoactive Musculoskeletal: FROM. no cyanosis.  Normal muscle tone.  Skin: Warm, dry, intact no rashes, lesions, ulcers. No induration Neurologic: CN 2-12 grossly intact.  Normal speech.  Sensation intact Psychiatric: Normal judgment and insight.  Normal mood.    Data Reviewed: Lab Work:    WBC 13,300 hemoglobin 17 hematocrit 49.0 platelets 246,000 sodium 143 potassium 3.6 chloride 106 bicarb 26 creatinine 0.75 BUN 13 glucose 133 COVID-negative    influenza A and B negative  Chest x-ray shows moderate left pleural effusion with associated atelectasis or consolidation in the left lower lobe  CT angiography of chest shows no PE.  There is a left pleural effusion associated with a partial left lower lung collapse and underlying  mass or abscess cannot be excluded.  Assessment and Plan: * CAP (community acquired pneumonia)- (present on admission) Ms. Spindler is admitted to Med-Surg floor.  Started on Cefepime and Vancomycin for pneumonia that has failed outpatient antibiotic regimen.  Concern for atypical vs fungal infection since pt is immunocompromised with biologic therapy of Dupixent for Eczema Supplemental oxygen as needed to keep O2 sat 92-96% Incentive spirometer every 2 hours while awake Check CBC, BMP in am  Essential hypertension- (present on admission) Continue Norvasc. Monitor BP  Eczema- (present on admission) Has been on biologic therapy but stopped it a few weeks ago.   History of breast cancer S/p chemo and radiation therapy  Advance Care Planning:   Code Status:   Full Code.   [place] score low.   Consults: IR consulted for throocentesis  Family Communication:   Diagnosis and plan discussed with patient and her husband who is at bedside.  They both verbalized  understanding agree with plan.  Further recommendations to follow as clinically indicated  Author: Eben Burow, MD 08/10/2021 10:27 PM  For on call review [email or web address].

## 2021-08-10 NOTE — ED Provider Triage Note (Signed)
Emergency Medicine Provider Triage Evaluation Note  Sherry Barry , a 51 y.o. female  was evaluated in triage.  Pt complains of left-sided pleuritic chest pain, shortness of breath, palpitations of the last 2 weeks.  She was diagnosed with pneumonia on 2/10.  Treated with amoxicillin, azithromycin, doxycycline, prednisone, Tessalon Perles with no improvement.  Sent here by primary care doctor.  No fever or chills.  Review of Systems  Positive:  Negative: See above  Physical Exam  BP (!) 143/118    Pulse (!) 115    Temp 98.6 F (37 C) (Oral)    Resp 18    SpO2 97%  Gen:   Awake, no distress   Resp:  Normal effort  MSK:   Moves extremities without difficulty  Other:  Tachycardic  Medical Decision Making  Medically screening exam initiated at 1:13 PM.  Appropriate orders placed.  Judeen [place] was informed that the remainder of the evaluation will be completed by another provider, this initial triage assessment does not replace that evaluation, and the importance of remaining in the ED until their evaluation is complete.     Myna Bright [place], [place] 08/10/21 1315

## 2021-08-10 NOTE — Assessment & Plan Note (Signed)
S/p chemo and radiation therapy

## 2021-08-10 NOTE — ED Provider Notes (Signed)
[facility]PART[facility] Provider Note   CSN: [id number] Arrival date & time: 08/10/21  1217     History  Chief Complaint  Patient presents with   Pneumonia    Stacye Noori is a 51 y.o. female.  HPI Patient presents with her husband due to ongoing concern for cough, dyspnea, left-sided chest pain.  Patient has a history of breast cancer in the distant past, hypertension, and until about the time of this illness beginning was on monthly Dupixent for dermatologic disorder.  Now over the past month patient has had ongoing cough, left-sided pleuritic chest pain.  She is completed a course of azithromycin, amoxicillin, multiple bronchodilators, doxycycline, Tessalon Perles, without improvement.  After being seen today for repeat evaluation following multiple attempts at treatment she was sent here due to persistent symptoms.  No current fever, no confusion, no lower extremity swelling.    Home Medications Prior to Admission medications   Medication Sig Start Date End Date Taking? Authorizing Provider  citalopram (CELEXA) 20 MG tablet Take 1 tablet (20 mg total) by mouth daily. 10/30/19   Daleen Squibb, MD      Allergies    Codeine, Latex, and Lisinopril    Review of Systems   Review of Systems  Constitutional:        Per HPI, otherwise negative  HENT:         Per HPI, otherwise negative  Respiratory:         Per HPI, otherwise negative  Cardiovascular:        Per HPI, otherwise negative  Gastrointestinal:  Negative for vomiting.  Endocrine:       Negative aside from HPI  Genitourinary:        Neg aside from HPI   Musculoskeletal:        Per HPI, otherwise negative  Skin: Negative.   Neurological:  Negative for syncope.   Physical Exam Updated Vital Signs BP (!) 143/118    Pulse (!) 115    Temp 98.6 F (37 C) (Oral)    Resp 18    SpO2 97%  Physical Exam Vitals and nursing note reviewed.  Constitutional:      General: She is  not in acute distress.    Appearance: She is well-developed.  HENT:     Head: Normocephalic and atraumatic.  Eyes:     Conjunctiva/sclera: Conjunctivae normal.  Cardiovascular:     Rate and Rhythm: Normal rate and regular rhythm.  Pulmonary:     Effort: Pulmonary effort is normal. No respiratory distress.     Breath sounds: Normal breath sounds. No stridor.  Abdominal:     General: There is no distension.  Musculoskeletal:     Right lower leg: No edema.     Left lower leg: No edema.  Skin:    General: Skin is warm and dry.  Neurological:     Mental Status: She is alert and oriented to person, place, and time.     Cranial Nerves: No cranial nerve deficit.    ED Results / Procedures / Treatments   Labs (all labs ordered are listed, but only abnormal results are displayed) Labs Reviewed  CBC WITH DIFFERENTIAL/PLATELET - Abnormal; Notable for the following components:      Result Value   WBC 13.3 (*)    RBC 5.70 (*)    Hemoglobin 17.0 (*)    HCT 49.0 (*)    Neutro Abs 12.1 (*)    Abs Immature Granulocytes  0.11 (*)    All other components within normal limits  BASIC METABOLIC PANEL - Abnormal; Notable for the following components:   Glucose, Bld 133 (*)    All other components within normal limits  RESP PANEL BY RT-PCR (FLU A&B, COVID) ARPGX2    EKG None  Radiology DG Chest 2 View  Result Date: 08/10/2021 CLINICAL DATA:  Shortness of breath, history of pneumonia EXAM: CHEST - 2 VIEW COMPARISON:  07/24/2021 FINDINGS: The heart size and mediastinal contours are within normal limits. Moderate left pleural effusion associated atelectasis or consolidation, diminished in volume compared to prior examination. The visualized skeletal structures are unremarkable. IMPRESSION: Moderate left pleural effusion associated atelectasis or consolidation, diminished in volume compared to prior examination. No new airspace opacity. Electronically Signed   By: Delanna Ahmadi M.D.   On:  08/10/2021 13:37    Procedures Procedures    Medications Ordered in ED Medications - No data to display  ED Course/ Medical Decision Making/ A&P  This patient presents to the ED for concern of dyspnea, left-sided chest pain, this involves an extensive number of treatment options, and is a complaint that carries with it a high risk of complications and morbidity.  The differential diagnosis includes pneumonia, loculated infection, pleural effusion, pneumothorax, atypical ACS, less likely PE   Co morbidities that complicate the patient evaluation  Prior breast cancer, recent relative immunocompromise state with Dupixent   Social Determinants of Health:  None   Additional history obtained:  Additional history and/or information obtained from husband and chart External records from outside source obtained and reviewed including prior outpatient clinic visits for dyspnea including multiple treatment modalities   After the initial evaluation, orders, including: Labs x-ray, eventually CT were initiated.  Patient placed on Cardiac and Pulse-Oximetry Monitors. The patient was maintained on a cardiac monitor.  The cardiac monitored showed an rhythm of 110 sinus tach abnormal The patient was also maintained on pulse oximetry. The readings were typically  97% room air normal  On repeat evaluation of the patient improved  Lab Tests:  I personally interpreted labs.  The pertinent results include: Leukocytosis, negative COVID, negative influenza  Imaging Studies ordered:  I independently visualized and interpreted imaging which showed x-ray with effusion, subsequent CT angiography without, pulmonary embolism, but with loculated effusion and partial collapse of the left lower lobe and airspace disease. I agree with the radiologist interpretation, and I demonstrated the patient's CT scans to her and her husband at bedside.  Consultations Obtained:  I requested consultation with the  general medicine,  and discussed lab and imaging findings as well as pertinent plan - they recommend: Admission  Dispostion / Final MDM:  After consideration of the diagnostic results and the patient's response to treatment, she will be admitted.  Adult female with ongoing cough, dyspnea presents with her husband after a month of no improvement spite of multiple different antibiotics.  Here the patient is awake and alert, has ongoing cough, diminished breath sounds on the left, and with distant cancer history broad differential including pulmonary embolism, and effusion, pneumonia all considered.  Patient's x-ray and CT consistent with effusion, and the latter with loculated infection.  Patient had initiation of broad-spectrum antibiotics, will require admission for ongoing therapy, consideration of IR procedure.  No early evidence of bacteremia, sepsis, no evidence for pulmonary embolism.  Additional sclerosis of the lumbar spine on CT scan can be considered following pulmonary stabilization.  Final Clinical Impression(s) / ED Diagnoses Final diagnoses:  Community  acquired pneumonia of left lower lobe of lung  Pleural effusion     Carmin Muskrat, MD 08/10/21 2115

## 2021-08-10 NOTE — Assessment & Plan Note (Signed)
Continue Norvasc. Monitor BP

## 2021-08-10 NOTE — Progress Notes (Signed)
Pharmacy Antibiotic Note  Sherry Barry is a 51 y.o. female admitted on 08/10/2021 presenting with cough, dyspnea.  Pharmacy has been consulted for vancomycin dosing.  Cefepime per MD  Plan: Vancomycin 1500 mg IV x 1, then 750 mg IV q 12h (eAUC 508, AUC goal 400-550, SCr used 0.8) Add MRSA PCR Monitor renal function, Cx/PCR to narrow Vancomycin levels as needed     Temp (24hrs), Avg:98.6 F (37 C), Min:98.6 F (37 C), Max:98.6 F (37 C)  Recent Labs  Lab 08/10/21 1320  WBC 13.3*  CREATININE 0.75    CrCl cannot be calculated (Unknown ideal weight.).    Allergies  Allergen Reactions   Codeine Itching and Hives   Latex Hives   Lisinopril     Bertis Ruddy, PharmD Clinical Pharmacist ED Pharmacist Phone # [phone] 08/10/2021 9:58 PM

## 2021-08-11 ENCOUNTER — Inpatient Hospital Stay (HOSPITAL_COMMUNITY): Payer: Federal, State, Local not specified - PPO

## 2021-08-11 DIAGNOSIS — Z853 Personal history of malignant neoplasm of breast: Secondary | ICD-10-CM | POA: Diagnosis not present

## 2021-08-11 DIAGNOSIS — Z48813 Encounter for surgical aftercare following surgery on the respiratory system: Secondary | ICD-10-CM | POA: Diagnosis not present

## 2021-08-11 DIAGNOSIS — J90 Pleural effusion, not elsewhere classified: Secondary | ICD-10-CM | POA: Diagnosis not present

## 2021-08-11 DIAGNOSIS — C384 Malignant neoplasm of pleura: Secondary | ICD-10-CM | POA: Diagnosis not present

## 2021-08-11 DIAGNOSIS — J189 Pneumonia, unspecified organism: Secondary | ICD-10-CM | POA: Diagnosis not present

## 2021-08-11 HISTORY — PX: IR THORACENTESIS ASP PLEURAL SPACE W/IMG GUIDE: IMG5380

## 2021-08-11 LAB — BASIC METABOLIC PANEL
Anion gap: 9 (ref 5–15)
BUN: 8 mg/dL (ref 6–20)
CO2: 26 mmol/L (ref 22–32)
Calcium: 9.1 mg/dL (ref 8.9–10.3)
Chloride: 106 mmol/L (ref 98–111)
Creatinine, Ser: 0.7 mg/dL (ref 0.44–1.00)
GFR, Estimated: >60 mL/min (ref >60)
Glucose, Bld: 82 mg/dL (ref 70–99)
Potassium: 3.5 mmol/L (ref 3.5–5.1)
Sodium: 141 mmol/L (ref 135–145)

## 2021-08-11 LAB — BODY FLUID CELL COUNT WITH DIFFERENTIAL
Eos, Fluid: 0 %
Lymphs, Fluid: 86 %
Monocyte-Macrophage-Serous Fluid: 13 % — ABNORMAL LOW (ref 50–90)
Neutrophil Count, Fluid: 1 % (ref 0–25)
Total Nucleated Cell Count, Fluid: 510 cu mm (ref 0–1000)

## 2021-08-11 LAB — PROTEIN, PLEURAL OR PERITONEAL FLUID: Total protein, fluid: 4.1 g/dL

## 2021-08-11 LAB — GRAM STAIN

## 2021-08-11 LAB — CBC
HCT: 45 % (ref 36.0–46.0)
Hemoglobin: 15.8 g/dL — ABNORMAL HIGH (ref 12.0–15.0)
MCH: 29.9 pg (ref 26.0–34.0)
MCHC: 35.1 g/dL (ref 30.0–36.0)
MCV: 85.2 fL (ref 80.0–100.0)
Platelets: 284 10*3/uL (ref 150–400)
RBC: 5.28 MIL/uL — ABNORMAL HIGH (ref 3.87–5.11)
RDW: 12.6 % (ref 11.5–15.5)
WBC: 12.3 10*3/uL — ABNORMAL HIGH (ref 4.0–10.5)
nRBC: 0 % (ref 0.0–0.2)

## 2021-08-11 LAB — HIV ANTIBODY (ROUTINE TESTING W REFLEX): HIV Screen 4th Generation wRfx: NONREACTIVE

## 2021-08-11 LAB — LACTATE DEHYDROGENASE, PLEURAL OR PERITONEAL FLUID: LD, Fluid: 129 U/L — ABNORMAL HIGH (ref 3–23)

## 2021-08-11 LAB — ALBUMIN, PLEURAL OR PERITONEAL FLUID: Albumin, Fluid: 2.8 g/dL

## 2021-08-11 LAB — GLUCOSE, PLEURAL OR PERITONEAL FLUID: Glucose, Fluid: 82 mg/dL

## 2021-08-11 LAB — MRSA NEXT GEN BY PCR, NASAL: MRSA by PCR Next Gen: NOT DETECTED

## 2021-08-11 MED ORDER — AMLODIPINE BESYLATE 5 MG PO TABS
5.0000 mg | ORAL_TABLET | Freq: Every day | ORAL | Status: DC
  Administered 2021-08-11 – 2021-08-15 (×5): 5 mg via ORAL
  Filled 2021-08-11 (×5): qty 1

## 2021-08-11 MED ORDER — LACTATED RINGERS IV SOLN: INTRAVENOUS | Status: DC

## 2021-08-11 MED ORDER — ONDANSETRON HCL 4 MG/2ML IJ SOLN: 4.0000 mg | Freq: Four times a day (QID) | INTRAMUSCULAR | Status: DC | PRN

## 2021-08-11 MED ORDER — ACETAMINOPHEN 325 MG PO TABS
650.0000 mg | ORAL_TABLET | Freq: Four times a day (QID) | ORAL | Status: DC | PRN
  Administered 2021-08-11: 650 mg via ORAL
  Filled 2021-08-11: qty 2

## 2021-08-11 MED ORDER — HYDRALAZINE HCL 20 MG/ML IJ SOLN: 5.0000 mg | Freq: Three times a day (TID) | INTRAMUSCULAR | Status: DC | PRN

## 2021-08-11 MED ORDER — SODIUM CHLORIDE 0.9 % IV SOLN
2.0000 g | Freq: Three times a day (TID) | INTRAVENOUS | Status: DC
  Administered 2021-08-11 – 2021-08-15 (×14): 2 g via INTRAVENOUS
  Filled 2021-08-11 (×15): qty 2

## 2021-08-11 MED ORDER — ALBUTEROL SULFATE (2.5 MG/3ML) 0.083% IN NEBU: 2.5000 mg | INHALATION_SOLUTION | RESPIRATORY_TRACT | Status: DC | PRN

## 2021-08-11 MED ORDER — ONDANSETRON HCL 4 MG PO TABS: 4.0000 mg | ORAL_TABLET | Freq: Four times a day (QID) | ORAL | Status: DC | PRN

## 2021-08-11 MED ORDER — LIDOCAINE HCL 1 % IJ SOLN
INTRAMUSCULAR | Status: AC
  Administered 2021-08-11: 10 mL
  Filled 2021-08-11: qty 20

## 2021-08-11 MED ORDER — ACETAMINOPHEN 650 MG RE SUPP: 650.0000 mg | Freq: Four times a day (QID) | RECTAL | Status: DC | PRN

## 2021-08-11 NOTE — Progress Notes (Signed)
NEW ADMISSION NOTE New Admission Note:   Arrival Method: Wheelchair Mental Orientation: AAOx4 Telemetry: n/a Assessment: Completed Skin: To be completed IV: LAC Pain: 0/10 Tubes: n/a Safety Measures: Safety Fall Prevention Plan has been given, discussed and signed Admission: Completed [facility] Orientation: Patient has been orientated to the room, unit and staff.  Family: Husband at bedside  Orders have been reviewed and implemented. Will continue to monitor the patient. Call light has been placed within reach and bed alarm has been activated.   Vira Agar, RN

## 2021-08-11 NOTE — ED Notes (Signed)
Pt transported to IR at this time.

## 2021-08-11 NOTE — Plan of Care (Signed)
  Problem: Activity: Goal: Ability to tolerate increased activity will improve Outcome: Progressing   Problem: Clinical Measurements: Goal: Ability to maintain a body temperature in the normal range will improve Outcome: Progressing   Problem: Respiratory: Goal: Ability to maintain adequate ventilation will improve Outcome: Progressing   

## 2021-08-11 NOTE — Procedures (Signed)
PROCEDURE SUMMARY:  Successful US guided left thoracentesis. Yielded 500 ml of clear yellow fluid. Pt tolerated procedure well. No immediate complications.  Specimen sent for labs. CXR ordered; no post-procedure pneumothorax identified   EBL < 2 mL  Theresa Duty, NP 08/11/2021 10:17 AM

## 2021-08-11 NOTE — Progress Notes (Signed)
PROGRESS NOTE    Sherry Barry  OAC:[id number] DOB: 09/27/1970 DOA: 08/10/2021 PCP: [facility] @ [place]   Brief Narrative: H&P per Dr. Tonie Griffith  08/10/2021  Sherry Barry is a 51 y.o. female with medical history significant of HTN, eczema ,hx of breast cancer 9 years ago who presents for evaluation of worsening dyspnea cough and left-sided chest pain.  She was recently treated for pneumonia by her primary physician.  She completed a course of azithromycin and amoxicillin but continued to have cough and was then given a course of doxycycline and Tessalon Perles which did not help.  She has a persistent cough that is nonproductive.  She has left-sided pleuritic lateral chest wall pain with coughing.  She has had shortness of breath with exertion.  She denies fevers or chills.  She has not had any confusion, urinary symptoms, abdominal pain, nausea vomiting or diarrhea.  No known sick contacts at home.  She has been treated with Dupixent for her eczema but she stopped taking it few weeks ago as she knew it could weaken her immune system. Patient continues to have pneumonia on chest x-ray and CT scan and underlying mass or abscess cannot be excluded.  She also has a pleural effusion.  She was started on vancomycin and cefepime in the emergency room and hospitalist service been asked to admit for further management Lives with her husband.  Denies tobacco or illicit drug use.  Drinks occasional alcoholic drinks socially Assessment & Plan:   Principal Problem:   CAP (community acquired pneumonia) Active Problems:   Essential hypertension   Eczema   History of breast cancer   #1 community-acquired pneumonia patient failed outpatient treatments twice.  She is admitted with worsening shortness of breath and cough.  Chest x-ray showed left pleural effusion with atelectasis and consolidation of the left lower lobe.  She is status post left thoracentesis 500 cc of fluid  removed.  The studies are pending. Continue IV Vanco and cefepime Neb treatments Incentive spirometer  #2 history of essential hypertension on Norvasc.  #3 history of breast cancer status post chemo and radiation.    Estimated body mass index is 27 kg/m as calculated from the following:   Height as of this encounter: 5' 2.5" (1.588 m).   Weight as of this encounter: 68 kg.  DVT prophylaxis: Lovenox Code Status: Full code Family Communication: None at bedside Disposition Plan:  Status is: Inpatient Remains inpatient appropriate because: Failed outpatient treatment for community-acquired pneumonia   Consultants:  Interventional radiology  Procedures: Left thoracentesis 08/11/2021 Antimicrobials: Vancomycin and cefepime  Subjective: Patient resting in bed continues to complain of cough and shortness of breath  Objective: Vitals:   08/11/21 1130 08/11/21 1200 08/11/21 1230 08/11/21 1330  BP: (!) 151/105 (!) 135/100 (!) 129/97 (!) 140/104  Pulse: 88 82 71 98  Resp: (!) 26 15 15  (!) 24  Temp:      TempSrc:      SpO2: 100% 97% 96% 98%  Weight:      Height:        Intake/Output Summary (Last 24 hours) at 08/11/2021 1423 Last data filed at 08/11/2021 1213 Gross per 24 hour  Intake 651.36 ml  Output --  Net 651.36 ml   Filed Weights   08/11/21 0033  Weight: 68 kg    Examination:  General exam: Appears calm and comfortable  Respiratory system diminished breath sounds on the left with rhonchi auscultation. Respiratory effort normal. Cardiovascular system: S1 & S2  heard, RRR. No JVD, murmurs, rubs, gallops or clicks. No pedal edema. Gastrointestinal system: Abdomen is nondistended, soft and nontender. No organomegaly or masses felt. Normal bowel sounds heard. Central nervous system: Alert and oriented. No focal neurological deficits. Extremities: Symmetric 5 x 5 power. Skin: No rashes, lesions or ulcers Psychiatry: Judgement and insight appear normal. Mood & affect  appropriate.     Data Reviewed: I have personally reviewed following labs and imaging studies  CBC: Recent Labs  Lab 08/10/21 1320 08/11/21 0532  WBC 13.3* 12.3*  NEUTROABS 12.1*  --   HGB 17.0* 15.8*  HCT 49.0* 45.0  MCV 86.0 85.2  PLT 346 182   Basic Metabolic Panel: Recent Labs  Lab 08/10/21 1320 08/11/21 0532  NA 143 141  K 3.6 3.5  CL 106 106  CO2 26 26  GLUCOSE 133* 82  BUN 13 8  CREATININE 0.75 0.70  CALCIUM 9.5 9.1   GFR: Estimated Creatinine Clearance: 77.9 mL/min (by C-G formula based on SCr of 0.7 mg/dL). Liver Function Tests: No results for input(s): AST, ALT, ALKPHOS, BILITOT, PROT, ALBUMIN in the last 168 hours. No results for input(s): LIPASE, AMYLASE in the last 168 hours. No results for input(s): AMMONIA in the last 168 hours. Coagulation Profile: No results for input(s): INR, PROTIME in the last 168 hours. Cardiac Enzymes: No results for input(s): CKTOTAL, CKMB, CKMBINDEX, TROPONINI in the last 168 hours. BNP (last 3 results) No results for input(s): PROBNP in the last 8760 hours. HbA1C: No results for input(s): HGBA1C in the last 72 hours. CBG: No results for input(s): GLUCAP in the last 168 hours. Lipid Profile: No results for input(s): CHOL, HDL, LDLCALC, TRIG, CHOLHDL, LDLDIRECT in the last 72 hours. Thyroid Function Tests: No results for input(s): TSH, T4TOTAL, FREET4, T3FREE, THYROIDAB in the last 72 hours. Anemia Panel: No results for input(s): VITAMINB12, FOLATE, FERRITIN, TIBC, IRON, RETICCTPCT in the last 72 hours. Sepsis Labs: No results for input(s): PROCALCITON, LATICACIDVEN in the last 168 hours.  Recent Results (from the past 240 hour(s))  Resp Panel by RT-PCR (Flu A&B, Covid) Nasopharyngeal Swab     Status: None   Collection Time: 08/10/21  1:13 PM   Specimen: Nasopharyngeal Swab; Nasopharyngeal(NP) swabs in vial transport medium  Result Value Ref Range Status   SARS Coronavirus 2 by RT PCR NEGATIVE NEGATIVE Final     Comment: (NOTE) SARS-CoV-2 target nucleic acids are NOT DETECTED.  The SARS-CoV-2 RNA is generally detectable in upper respiratory specimens during the acute phase of infection. The lowest concentration of SARS-CoV-2 viral copies this assay can detect is 138 copies/mL. A negative result does not preclude SARS-Cov-2 infection and should not be used as the sole basis for treatment or other patient management decisions. A negative result may occur with  improper specimen collection/handling, submission of specimen other than nasopharyngeal swab, presence of viral mutation(s) within the areas targeted by this assay, and inadequate number of viral copies(<138 copies/mL). A negative result must be combined with clinical observations, patient history, and epidemiological information. The expected result is Negative.  Fact Sheet for Patients:  EntrepreneurPulse.com.au  Fact Sheet for Healthcare Providers:  IncredibleEmployment.be  This test is no t yet approved or cleared by the [place] FDA and  has been authorized for detection and/or diagnosis of SARS-CoV-2 by FDA under an Emergency Use Authorization (EUA). This EUA will remain  in effect (meaning this test can be used) for the duration of the COVID-19 declaration under Section 564(b)(1) of the Act, 21  U.S.C.section 360bbb-3(b)(1), unless the authorization is terminated  or revoked sooner.       Influenza A by PCR NEGATIVE NEGATIVE Final   Influenza B by PCR NEGATIVE NEGATIVE Final    Comment: (NOTE) The Xpert Xpress SARS-CoV-2/FLU/RSV plus assay is intended as an aid in the diagnosis of influenza from Nasopharyngeal swab specimens and should not be used as a sole basis for treatment. Nasal washings and aspirates are unacceptable for Xpert Xpress SARS-CoV-2/FLU/RSV testing.  Fact Sheet for Patients: EntrepreneurPulse.com.au  Fact Sheet for Healthcare  Providers: IncredibleEmployment.be  This test is not yet approved or cleared by the [place] FDA and has been authorized for detection and/or diagnosis of SARS-CoV-2 by FDA under an Emergency Use Authorization (EUA). This EUA will remain in effect (meaning this test can be used) for the duration of the COVID-19 declaration under Section 564(b)(1) of the Act, 21 U.S.C. section 360bbb-3(b)(1), unless the authorization is terminated or revoked.  Performed at [facility], [place] [address] [place], [place] [address]   MRSA Next Gen by PCR, Nasal     Status: None   Collection Time: 08/10/21  9:47 PM   Specimen: Nasal Mucosa; Nasal Swab  Result Value Ref Range Status   MRSA by PCR Next Gen NOT DETECTED NOT DETECTED Final    Comment: (NOTE) The GeneXpert MRSA Assay (FDA approved for NASAL specimens only), is one component of a comprehensive MRSA colonization surveillance program. It is not intended to diagnose MRSA infection nor to guide or monitor treatment for MRSA infections. Test performance is not FDA approved in patients less than 20 years old. Performed at [facility], [place] [address] [place], [place] [address]   Culture, body fluid w Gram Stain-bottle     Status: None (Preliminary result)   Collection Time: 08/11/21  9:35 AM   Specimen: Pleura  Result Value Ref Range Status   Specimen Description PLEURAL FLUID  Final   Special Requests   Final    NONE Performed at [facility], [place] [address] [place], [place] [address]    Culture PENDING  Incomplete   Report Status PENDING  Incomplete  Gram stain     Status: None   Collection Time: 08/11/21  9:35 AM   Specimen: Pleura  Result Value Ref Range Status   Specimen Description PLEURAL FLUID  Final   Special Requests NONE  Final   Gram Stain   Final    WBC PRESENT, PREDOMINANTLY MONONUCLEAR NO ORGANISMS SEEN CYTOSPIN SMEAR Performed at [facility], [place] [address],  [place], [place] [address]    Report Status 08/11/2021 FINAL  Final         Radiology Studies: DG Chest 1 View  Result Date: 08/11/2021 CLINICAL DATA:  Left thoracentesis EXAM: CHEST  1 VIEW COMPARISON:  Chest radiograph 1 day prior FINDINGS: Cardiomediastinal silhouette is normal. The left pleural effusion has decreased in size, with improved aeration of the left base following thoracentesis. There is no appreciable pneumothorax. The right lung remains clear. There is no right effusion or pneumothorax. The bones are stable. Right axillary surgical clips are again noted. IMPRESSION: Decreased size of the left pleural effusion with improved aeration of the left base following thoracentesis. No appreciable pneumothorax. Electronically Signed   By: Valetta Mole M.D.   On: 08/11/2021 09:28   DG Chest 2 View  Result Date: 08/10/2021 CLINICAL DATA:  Shortness of breath, history of pneumonia EXAM: CHEST - 2 VIEW COMPARISON:  07/24/2021 FINDINGS: The heart size and mediastinal contours are within normal limits. Moderate left pleural effusion associated atelectasis or consolidation, diminished in volume compared to prior examination. The visualized skeletal structures are unremarkable. IMPRESSION: Moderate left pleural effusion associated atelectasis or consolidation, diminished in volume compared to prior examination. No new airspace opacity. Electronically Signed   By: Delanna Ahmadi M.D.   On: 08/10/2021 13:37   CT Angio Chest PE W/Cm &/Or Wo Cm  Result Date: 08/10/2021 CLINICAL DATA:  Pulmonary embolism (PE) suspected, high prob. pneumonia since 2/10 and is not improving. Breast cancer. EXAM: CT ANGIOGRAPHY CHEST WITH CONTRAST TECHNIQUE: Multidetector CT imaging of the chest was performed using the standard protocol during bolus administration of intravenous contrast. Multiplanar CT image reconstructions and MIPs were obtained to evaluate the vascular anatomy. RADIATION DOSE REDUCTION: This exam was  performed according to the departmental dose-optimization program which includes automated exposure control, adjustment of the mA and/or kV according to patient size and/or use of iterative reconstruction technique. CONTRAST:  17mL OMNIPAQUE IOHEXOL 350 MG/ML SOLN COMPARISON:  CT chest 07/07/2012. PET CT 07/07/2012 FINDINGS: Cardiovascular: Satisfactory opacification of the pulmonary arteries to the segmental level. No evidence of pulmonary embolism. Normal heart size. No significant pericardial effusion. The thoracic aorta is normal in caliber. No atherosclerotic plaque of the thoracic aorta. No coronary artery calcifications. Mediastinum/Nodes: Right axillary lymphadenectomy. No enlarged mediastinal, hilar, or axillary lymph nodes. Thyroid gland, trachea, and esophagus demonstrate no significant findings. Lungs/Pleura: Partial left lower lobe collapse that appears to be heterogeneous with underlying mass lesion or abscess is not excluded. No focal consolidation. No pulmonary nodule. No pulmonary mass. Small volume left pleural effusion. No pneumothorax. Upper Abdomen: There is a persistent 1.4 cm fluid density lesion within the right hepatic lobe that likely represents a simple hepatic cyst. No acute abnormality. Musculoskeletal: Bilateral breast implants. Interval development of an indeterminate sclerotic lesion along the superior endplate of the O67 vertebral body and posterior aspect of the T9 vertebral body. No acute displaced fracture. Multilevel degenerative changes of the spine. Review of the MIP images confirms the above findings. IMPRESSION: 1. No pulmonary embolus. 2. Small left pleural effusion with associated partial left lower lobe collapse that appears to be heterogeneous. Limited evaluation due to timing of contrast. Underlying mass lesion or abscess not excluded. 3. Interval development of an indeterminate sclerotic lesion along the superior endplate of the T24 vertebral body and posterior aspect  of the T9 vertebral body. Findings could represent metastatic disease in a patient with history of malignancy. Electronically Signed   By: Iven Finn M.D.   On: 08/10/2021 19:15   IR THORACENTESIS ASP PLEURAL SPACE W/IMG GUIDE  Result Date: 08/11/2021 INDICATION: Patient with a history of breast cancer and pneumonia presents today with a left pleural effusion. Interventional radiology asked to perform a diagnostic and therapeutic thoracentesis. EXAM: ULTRASOUND GUIDED THORACENTESIS MEDICATIONS: 1% lidocaine 10 mL COMPLICATIONS: None immediate. PROCEDURE: An ultrasound guided thoracentesis was thoroughly discussed with the patient and questions answered. The benefits, risks, alternatives and complications were also discussed. The patient understands and wishes to proceed with the procedure. Written consent was obtained. Ultrasound was performed to localize and mark an adequate pocket of fluid in the left chest. The area was then prepped and draped in the normal sterile fashion. 1% Lidocaine was used for local anesthesia. Under ultrasound guidance a 6 Fr Safe-T-Centesis catheter was introduced. Thoracentesis was performed. The catheter was removed and a dressing applied. FINDINGS: A total of approximately 500 mL  of clear yellow fluid was removed. Samples were sent to the laboratory as requested by the clinical team. Procedure stopped early due to patient coughing and discomfort. IMPRESSION: Successful ultrasound guided left thoracentesis yielding 500 mL of pleural fluid. Read by: Soyla Dryer, NP Electronically Signed   By: Miachel Roux M.D.   On: 08/11/2021 12:38        Scheduled Meds:  amLODipine  5 mg Oral Daily   Continuous Infusions:  ceFEPime (MAXIPIME) IV Stopped (08/11/21 0759)   lactated ringers 75 mL/hr at 08/11/21 0507   vancomycin Stopped (08/11/21 1213)     LOS: 1 day    Time spent: 39 minutes    Georgette Shell, MD 08/11/2021, 2:23 PM

## 2021-08-11 NOTE — Plan of Care (Signed)
?  Problem: Clinical Measurements: ?Goal: Ability to maintain a body temperature in the normal range will improve ?Outcome: Progressing ?  ?Problem: Respiratory: ?Goal: Ability to maintain adequate ventilation will improve ?Outcome: Progressing ?  ?

## 2021-08-12 DIAGNOSIS — L2082 Flexural eczema: Secondary | ICD-10-CM | POA: Diagnosis not present

## 2021-08-12 DIAGNOSIS — I10 Essential (primary) hypertension: Secondary | ICD-10-CM | POA: Diagnosis not present

## 2021-08-12 DIAGNOSIS — Z853 Personal history of malignant neoplasm of breast: Secondary | ICD-10-CM | POA: Diagnosis not present

## 2021-08-12 DIAGNOSIS — J189 Pneumonia, unspecified organism: Secondary | ICD-10-CM | POA: Diagnosis not present

## 2021-08-12 DIAGNOSIS — J90 Pleural effusion, not elsewhere classified: Secondary | ICD-10-CM | POA: Diagnosis not present

## 2021-08-12 LAB — LACTATE DEHYDROGENASE: LDH: 153 U/L (ref 98–192)

## 2021-08-12 LAB — ACID FAST SMEAR (AFB, MYCOBACTERIA): Acid Fast Smear: NEGATIVE

## 2021-08-12 LAB — PROTEIN, TOTAL: Total Protein: 6.8 g/dL (ref 6.5–8.1)

## 2021-08-12 NOTE — Progress Notes (Signed)
?  Transition of Care (TOC) Screening Note ? ? ?Patient Details  ?Name: Sherry Barry ?Date of Birth: 1970/12/05 ? ? ?Transition of Care (TOC) CM/SW Contact:    ?Tom-Johnson, Renea Ee, RN ?Phone Number: ?08/12/2021, 4:08 PM ? ?Patient is from home with husband and two children. Admitted for Community Acquired Pneumonia. Has Hx. Of breast Ca. CT scan of the chest showed underlying mass or abscess which cannot be excluded with sclerotic lesion in T11 vertebral body and T9 vertebral body, which could represent metastatic disease in a patient with history of malignancy per MD.Oncology consulted. Patient's mother and siblings are very supportive with her care. Currently employed at [employer]. Independent with care and drives self prior to admission. Does not have DME's at home. PCP is at [facility] office. Uses [facility] in [place].  ? Transition of Care Department [facility]) has reviewed patient and no TOC needs or recommendations have been identified at this time. TOC will continue to monitor patient advancement through interdisciplinary progression rounds. If new patient transition needs arise, please place a TOC consult. ?  ?

## 2021-08-12 NOTE — Plan of Care (Signed)
  Problem: Activity: Goal: Ability to tolerate increased activity will improve Outcome: Progressing   

## 2021-08-12 NOTE — Plan of Care (Signed)
  Problem: Activity: Goal: Ability to tolerate increased activity will improve Outcome: Progressing   Problem: Clinical Measurements: Goal: Ability to maintain a body temperature in the normal range will improve Outcome: Progressing   

## 2021-08-12 NOTE — Progress Notes (Addendum)
PROGRESS NOTE    Sherry Barry  SEG:[id number] DOB: Jan 30, 1971 DOA: 08/10/2021 PCP: [facility] [place]    Brief Narrative: Sherry Barry is a 51 y.o. female with medical history significant of HTN, eczema ,hx of breast cancer 9 years ago who presents for evaluation of worsening dyspnea, persistent cough and left-sided pleuritic chest pain for a couple of weeks. Failed outpatient treatment X 2 (completed a course of azithromycin/amoxicillin and a course of doxycycline). She has been treated with Dupixent for her eczema but she stopped taking it few weeks ago as she knew it could weaken her immune system. CT scan chest showed underlying mass or abscess cannot be excluded with sclerotic lesion in T11 vertebral body and T9 vertebral body, which could represent metastatic disease in a patient with history of malignancy. Noted L sided pleural effusion s/p thoracentesis on 08/11/21. Admitted for further management.   Assessment & Plan:   Principal Problem:   CAP (community acquired pneumonia) Active Problems:   Essential hypertension   Eczema   History of breast cancer   Community-acquired pneumonia  ?post-obstructive PNA Failed outpatient treatment X 2 Currently afebrile, with leukocytosis, on RA CT scan chest showed underlying mass or abscess which cannot be excluded with sclerotic lesion in T11 vertebral body and T9 vertebral body, which could represent ?metastatic disease in a patient with history of malignancy Further work up needed to exclude malignancy, will touch base with oncology Continue IV Vanco and cefepime Neb treatments, Incentive spirometer, supplemental O2 prn Monitor closely  Left pleural effusion ?parapneumonic effusion Vs post-obstructive PNA S/P left thoracentesis 500 cc of fluid removed Per Lights criteria, exudative Pleural fluid gram stain/culture, no growth, acid fast smear neg, acid fast cx and fungus culture pending,  cytology pending Monitor closely  Essential hypertension  Continue Norvasc  History of breast cancer status post chemo and radiation    Estimated body mass index is 27 kg/m as calculated from the following:   Height as of this encounter: 5' 2.5" (1.588 m).   Weight as of this encounter: 68 kg.    DVT prophylaxis: Lovenox Code Status: Full code Family Communication: None at bedside Disposition Plan:  Status is: Inpatient Remains inpatient appropriate because: Failed outpatient treatment for community-acquired pneumonia   Consultants:  Interventional radiology  Procedures: Left thoracentesis 08/11/2021 Antimicrobials: Vancomycin and cefepime   Subjective: Patient denies any new complaints, reports some L sided chest pain post thoracentesis, reports improving cough.  Objective: Vitals:   08/11/21 2026 08/12/21 0018 08/12/21 0432 08/12/21 0918  BP: 106/81 116/85 (!) 106/91 132/85  Pulse: 96 77 75 100  Resp: 18 18 18 18   Temp: 98.3 F (36.8 C) 98.3 F (36.8 C) 97.8 F (36.6 C) 98.5 F (36.9 C)  TempSrc: Oral     SpO2: 98% 99% 99% 99%  Weight:      Height:        Intake/Output Summary (Last 24 hours) at 08/12/2021 1428 Last data filed at 08/12/2021 1300 Gross per 24 hour  Intake 1729.58 ml  Output --  Net 1729.58 ml   Filed Weights   08/11/21 0033  Weight: 68 kg    Examination: General: NAD  Cardiovascular: S1, S2 present Respiratory: CTAB Abdomen: Soft, nontender, nondistended, bowel sounds present Musculoskeletal: No bilateral pedal edema noted Skin: Normal Psychiatry: Normal mood       Data Reviewed: I have personally reviewed following labs and imaging studies  CBC: Recent Labs  Lab 08/10/21 1320 08/11/21 0532  WBC 13.3*  12.3*  NEUTROABS 12.1*  --   HGB 17.0* 15.8*  HCT 49.0* 45.0  MCV 86.0 85.2  PLT 346 381   Basic Metabolic Panel: Recent Labs  Lab 08/10/21 1320 08/11/21 0532  NA 143 141  K 3.6 3.5  CL 106 106  CO2 26 26   GLUCOSE 133* 82  BUN 13 8  CREATININE 0.75 0.70  CALCIUM 9.5 9.1   GFR: Estimated Creatinine Clearance: 77.9 mL/min (by C-G formula based on SCr of 0.7 mg/dL). Liver Function Tests: Recent Labs  Lab 08/12/21 0906  PROT 6.8   No results for input(s): LIPASE, AMYLASE in the last 168 hours. No results for input(s): AMMONIA in the last 168 hours. Coagulation Profile: No results for input(s): INR, PROTIME in the last 168 hours. Cardiac Enzymes: No results for input(s): CKTOTAL, CKMB, CKMBINDEX, TROPONINI in the last 168 hours. BNP (last 3 results) No results for input(s): PROBNP in the last 8760 hours. HbA1C: No results for input(s): HGBA1C in the last 72 hours. CBG: No results for input(s): GLUCAP in the last 168 hours. Lipid Profile: No results for input(s): CHOL, HDL, LDLCALC, TRIG, CHOLHDL, LDLDIRECT in the last 72 hours. Thyroid Function Tests: No results for input(s): TSH, T4TOTAL, FREET4, T3FREE, THYROIDAB in the last 72 hours. Anemia Panel: No results for input(s): VITAMINB12, FOLATE, FERRITIN, TIBC, IRON, RETICCTPCT in the last 72 hours. Sepsis Labs: No results for input(s): PROCALCITON, LATICACIDVEN in the last 168 hours.  Recent Results (from the past 240 hour(s))  Resp Panel by RT-PCR (Flu A&B, Covid) Nasopharyngeal Swab     Status: None   Collection Time: 08/10/21  1:13 PM   Specimen: Nasopharyngeal Swab; Nasopharyngeal(NP) swabs in vial transport medium  Result Value Ref Range Status   SARS Coronavirus 2 by RT PCR NEGATIVE NEGATIVE Final    Comment: (NOTE) SARS-CoV-2 target nucleic acids are NOT DETECTED.  The SARS-CoV-2 RNA is generally detectable in upper respiratory specimens during the acute phase of infection. The lowest concentration of SARS-CoV-2 viral copies this assay can detect is 138 copies/mL. A negative result does not preclude SARS-Cov-2 infection and should not be used as the sole basis for treatment or other patient management decisions. A  negative result may occur with  improper specimen collection/handling, submission of specimen other than nasopharyngeal swab, presence of viral mutation(s) within the areas targeted by this assay, and inadequate number of viral copies(<138 copies/mL). A negative result must be combined with clinical observations, patient history, and epidemiological information. The expected result is Negative.  Fact Sheet for Patients:  EntrepreneurPulse.com.au  Fact Sheet for Healthcare Providers:  IncredibleEmployment.be  This test is no t yet approved or cleared by the [place] FDA and  has been authorized for detection and/or diagnosis of SARS-CoV-2 by FDA under an Emergency Use Authorization (EUA). This EUA will remain  in effect (meaning this test can be used) for the duration of the COVID-19 declaration under Section 564(b)(1) of the Act, 21 U.S.C.section 360bbb-3(b)(1), unless the authorization is terminated  or revoked sooner.       Influenza A by PCR NEGATIVE NEGATIVE Final   Influenza B by PCR NEGATIVE NEGATIVE Final    Comment: (NOTE) The Xpert Xpress SARS-CoV-2/FLU/RSV plus assay is intended as an aid in the diagnosis of influenza from Nasopharyngeal swab specimens and should not be used as a sole basis for treatment. Nasal washings and aspirates are unacceptable for Xpert Xpress SARS-CoV-2/FLU/RSV testing.  Fact Sheet for Patients: EntrepreneurPulse.com.au  Fact Sheet for Healthcare Providers: IncredibleEmployment.be  This test is not yet approved or cleared by the [place] and has been authorized for detection and/or diagnosis of SARS-CoV-2 by FDA under an Emergency Use Authorization (EUA). This EUA will remain in effect (meaning this test can be used) for the duration of the COVID-19 declaration under Section 564(b)(1) of the Act, 21 U.S.C. section 360bbb-3(b)(1), unless the authorization  is terminated or revoked.  Performed at [facility], [place] [address], [place], [place] [address]   MRSA Next Gen by PCR, Nasal     Status: None   Collection Time: 08/10/21  9:47 PM   Specimen: Nasal Mucosa; Nasal Swab  Result Value Ref Range Status   MRSA by PCR Next Gen NOT DETECTED NOT DETECTED Final    Comment: (NOTE) The GeneXpert MRSA Assay (FDA approved for NASAL specimens only), is one component of a comprehensive MRSA colonization surveillance program. It is not intended to diagnose MRSA infection nor to guide or monitor treatment for MRSA infections. Test performance is not FDA approved in patients less than 50 years old. Performed at [facility], [place] [address], [place], [place] [address]   Culture, body fluid w Gram Stain-bottle     Status: None (Preliminary result)   Collection Time: 08/11/21  9:35 AM   Specimen: Pleura  Result Value Ref Range Status   Specimen Description PLEURAL FLUID  Final   Special Requests NONE  Final   Culture   Final    NO GROWTH < 24 HOURS Performed at [facility], [place] [address], [place], [place] [address]    Report Status PENDING  Incomplete  Gram stain     Status: None   Collection Time: 08/11/21  9:35 AM   Specimen: Pleura  Result Value Ref Range Status   Specimen Description PLEURAL FLUID  Final   Special Requests NONE  Final   Gram Stain   Final    WBC PRESENT, PREDOMINANTLY MONONUCLEAR NO ORGANISMS SEEN CYTOSPIN SMEAR Performed at [facility], [place] [address], [place], [place] [address]    Report Status 08/11/2021 FINAL  Final  Acid Fast Smear (AFB)     Status: None   Collection Time: 08/11/21 11:03 AM   Specimen: PATH Cytology Pleural fluid  Result Value Ref Range Status   AFB Specimen Processing Concentration  Final   Acid Fast Smear Negative  Final    Comment: (NOTE) Performed At: [facility] [place], [place] [id number] Rush Farmer MD KK:[id number]    Source  (AFB) THORACIC  Final    Comment: Performed at [facility], [place] [address], [place],  [address]         Radiology Studies: DG Chest 1 View  Result Date: 08/11/2021 CLINICAL DATA:  Left thoracentesis EXAM: CHEST  1 VIEW COMPARISON:  Chest radiograph 1 day prior FINDINGS: Cardiomediastinal silhouette is normal. The left pleural effusion has decreased in size, with improved aeration of the left base following thoracentesis. There is no appreciable pneumothorax. The right lung remains clear. There is no right effusion or pneumothorax. The bones are stable. Right axillary surgical clips are again noted. IMPRESSION: Decreased size of the left pleural effusion with improved aeration of the left base following thoracentesis. No appreciable pneumothorax. Electronically Signed   By: Valetta Mole M.D.   On: 08/11/2021 09:28   CT Angio Chest PE W/Cm &/Or Wo Cm  Result Date: 08/10/2021 CLINICAL DATA:  Pulmonary embolism (PE) suspected, high prob. pneumonia  since 2/10 and is not improving. Breast cancer. EXAM: CT ANGIOGRAPHY CHEST WITH CONTRAST TECHNIQUE: Multidetector CT imaging of the chest was performed using the standard protocol during bolus administration of intravenous contrast. Multiplanar CT image reconstructions and MIPs were obtained to evaluate the vascular anatomy. RADIATION DOSE REDUCTION: This exam was performed according to the departmental dose-optimization program which includes automated exposure control, adjustment of the mA and/or kV according to patient size and/or use of iterative reconstruction technique. CONTRAST:  13mL OMNIPAQUE IOHEXOL 350 MG/ML SOLN COMPARISON:  CT chest 07/07/2012. PET CT 07/07/2012 FINDINGS: Cardiovascular: Satisfactory opacification of the pulmonary arteries to the segmental level. No evidence of pulmonary embolism. Normal heart size. No significant pericardial effusion. The thoracic aorta is normal in caliber. No atherosclerotic plaque of the thoracic  aorta. No coronary artery calcifications. Mediastinum/Nodes: Right axillary lymphadenectomy. No enlarged mediastinal, hilar, or axillary lymph nodes. Thyroid gland, trachea, and esophagus demonstrate no significant findings. Lungs/Pleura: Partial left lower lobe collapse that appears to be heterogeneous with underlying mass lesion or abscess is not excluded. No focal consolidation. No pulmonary nodule. No pulmonary mass. Small volume left pleural effusion. No pneumothorax. Upper Abdomen: There is a persistent 1.4 cm fluid density lesion within the right hepatic lobe that likely represents a simple hepatic cyst. No acute abnormality. Musculoskeletal: Bilateral breast implants. Interval development of an indeterminate sclerotic lesion along the superior endplate of the K93 vertebral body and posterior aspect of the T9 vertebral body. No acute displaced fracture. Multilevel degenerative changes of the spine. Review of the MIP images confirms the above findings. IMPRESSION: 1. No pulmonary embolus. 2. Small left pleural effusion with associated partial left lower lobe collapse that appears to be heterogeneous. Limited evaluation due to timing of contrast. Underlying mass lesion or abscess not excluded. 3. Interval development of an indeterminate sclerotic lesion along the superior endplate of the G18 vertebral body and posterior aspect of the T9 vertebral body. Findings could represent metastatic disease in a patient with history of malignancy. Electronically Signed   By: Iven Finn M.D.   On: 08/10/2021 19:15   IR THORACENTESIS ASP PLEURAL SPACE W/IMG GUIDE  Result Date: 08/11/2021 INDICATION: Patient with a history of breast cancer and pneumonia presents today with a left pleural effusion. Interventional radiology asked to perform a diagnostic and therapeutic thoracentesis. EXAM: ULTRASOUND GUIDED THORACENTESIS MEDICATIONS: 1% lidocaine 10 mL COMPLICATIONS: None immediate. PROCEDURE: An ultrasound guided  thoracentesis was thoroughly discussed with the patient and questions answered. The benefits, risks, alternatives and complications were also discussed. The patient understands and wishes to proceed with the procedure. Written consent was obtained. Ultrasound was performed to localize and mark an adequate pocket of fluid in the left chest. The area was then prepped and draped in the normal sterile fashion. 1% Lidocaine was used for local anesthesia. Under ultrasound guidance a 6 Fr Safe-T-Centesis catheter was introduced. Thoracentesis was performed. The catheter was removed and a dressing applied. FINDINGS: A total of approximately 500 mL of clear yellow fluid was removed. Samples were sent to the laboratory as requested by the clinical team. Procedure stopped early due to patient coughing and discomfort. IMPRESSION: Successful ultrasound guided left thoracentesis yielding 500 mL of pleural fluid. Read by: Soyla Dryer, NP Electronically Signed   By: Miachel Roux M.D.   On: 08/11/2021 12:38        Scheduled Meds:  amLODipine  5 mg Oral Daily   Continuous Infusions:  ceFEPime (MAXIPIME) IV 2 g (08/12/21 0532)   vancomycin 750 mg (  08/12/21 1028)     LOS: 2 days     Alma Friendly, MD 08/12/2021, 2:28 PM

## 2021-08-13 DIAGNOSIS — I10 Essential (primary) hypertension: Secondary | ICD-10-CM | POA: Diagnosis not present

## 2021-08-13 DIAGNOSIS — Z853 Personal history of malignant neoplasm of breast: Secondary | ICD-10-CM | POA: Diagnosis not present

## 2021-08-13 DIAGNOSIS — J189 Pneumonia, unspecified organism: Secondary | ICD-10-CM | POA: Diagnosis not present

## 2021-08-13 DIAGNOSIS — J90 Pleural effusion, not elsewhere classified: Secondary | ICD-10-CM | POA: Diagnosis not present

## 2021-08-13 LAB — CBC WITH DIFFERENTIAL/PLATELET
Abs Immature Granulocytes: 0.16 10*3/uL — ABNORMAL HIGH (ref 0.00–0.07)
Basophils Absolute: 0.1 10*3/uL (ref 0.0–0.1)
Basophils Relative: 1 %
Eosinophils Absolute: 0.3 10*3/uL (ref 0.0–0.5)
Eosinophils Relative: 3 %
HCT: 47 % — ABNORMAL HIGH (ref 36.0–46.0)
Hemoglobin: 15.6 g/dL — ABNORMAL HIGH (ref 12.0–15.0)
Immature Granulocytes: 2 %
Lymphocytes Relative: 25 %
Lymphs Abs: 2.4 10*3/uL (ref 0.7–4.0)
MCH: 29.4 pg (ref 26.0–34.0)
MCHC: 33.2 g/dL (ref 30.0–36.0)
MCV: 88.5 fL (ref 80.0–100.0)
Monocytes Absolute: 0.7 10*3/uL (ref 0.1–1.0)
Monocytes Relative: 7 %
Neutro Abs: 6.2 10*3/uL (ref 1.7–7.7)
Neutrophils Relative %: 62 %
Platelets: 263 10*3/uL (ref 150–400)
RBC: 5.31 MIL/uL — ABNORMAL HIGH (ref 3.87–5.11)
RDW: 12.7 % (ref 11.5–15.5)
WBC: 9.9 10*3/uL (ref 4.0–10.5)
nRBC: 0 % (ref 0.0–0.2)

## 2021-08-13 LAB — BASIC METABOLIC PANEL
Anion gap: 14 (ref 5–15)
BUN: 14 mg/dL (ref 6–20)
CO2: 19 mmol/L — ABNORMAL LOW (ref 22–32)
Calcium: 8.6 mg/dL — ABNORMAL LOW (ref 8.9–10.3)
Chloride: 108 mmol/L (ref 98–111)
Creatinine, Ser: 0.7 mg/dL (ref 0.44–1.00)
GFR, Estimated: >60 mL/min (ref >60)
Glucose, Bld: 81 mg/dL (ref 70–99)
Potassium: 4.2 mmol/L (ref 3.5–5.1)
Sodium: 141 mmol/L (ref 135–145)

## 2021-08-13 NOTE — Progress Notes (Signed)
PROGRESS NOTE    Sherry Barry  ZGY:[id number] DOB: 04-09-1971 DOA: 08/10/2021 PCP: [facility]    Brief Narrative: Sherry Barry is a 51 y.o. female with medical history significant of HTN, eczema ,hx of breast cancer 9 years ago who presents for evaluation of worsening dyspnea, persistent cough and left-sided pleuritic chest pain for a couple of weeks. Failed outpatient treatment X 2 (completed a course of azithromycin/amoxicillin and a course of doxycycline). She has been treated with Dupixent for her eczema but she stopped taking it few weeks ago as she knew it could weaken her immune system. CT scan chest showed underlying mass or abscess cannot be excluded with sclerotic lesion in T11 vertebral body and T9 vertebral body, which could represent metastatic disease in a patient with history of malignancy. Noted L sided pleural effusion s/p thoracentesis on 08/11/21. Admitted for further management.    Assessment & Plan:   Principal Problem:   CAP (community acquired pneumonia) Active Problems:   Essential hypertension   Eczema   History of breast cancer   Community-acquired pneumonia  ?post-obstructive PNA Failed outpatient treatment X 2 Currently afebrile, with leukocytosis, on RA CT scan chest showed underlying mass or abscess which cannot be excluded with sclerotic lesion in T11 vertebral body and T9 vertebral body, which could represent ?metastatic disease in a patient with history of malignancy Further work up needed to exclude malignancy Discussed with Dr Alvy Bimler on 08/13/21, recommend close follow up with oncology, they will set up appointment as pt is high risk Continue IV Vanco and cefepime given possible post obstructive/failed twice, will require 7 day course of AB Neb treatments, Incentive spirometer, supplemental O2 prn Monitor closely  Left pleural effusion ?parapneumonic effusion Vs post-obstructive PNA S/P left  thoracentesis 500 cc of fluid removed Per Lights criteria, exudative Pleural fluid gram stain/culture, no growth, acid fast smear neg, acid fast cx and fungus culture pending, cytology pending Monitor closely  Essential hypertension  Continue Norvasc  History of breast cancer status post chemo and radiation    Estimated body mass index is 27 kg/m as calculated from the following:   Height as of this encounter: 5' 2.5" (1.588 m).   Weight as of this encounter: 68 kg.    DVT prophylaxis: Lovenox Code Status: Full code Family Communication: None at bedside Disposition Plan:  Status is: Inpatient Remains inpatient appropriate because: Failed outpatient treatment for community-acquired pneumonia   Consultants:  Interventional radiology  Procedures: Left thoracentesis 08/11/2021 Antimicrobials: Vancomycin and cefepime   Subjective: Patient denies any new complaints, continues to improve although still has some left-sided pleuritic chest pain.  Discussed extensively with patient about plan and concerning findings on CT imaging and the need for oncology follow-up, patient is in agreement.    Objective: Vitals:   08/12/21 2049 08/13/21 0458 08/13/21 0941 08/13/21 1648  BP: (!) 128/95 (!) 128/94 (!) 125/101 (!) 133/107  Pulse: 95 76 97 95  Resp: 18 16 19 17   Temp: 98.8 F (37.1 C) 98.7 F (37.1 C) 98.1 F (36.7 C) 98.6 F (37 C)  TempSrc: Oral Oral Oral Oral  SpO2: 100% 99% 97% 100%  Weight:      Height:        Intake/Output Summary (Last 24 hours) at 08/13/2021 1859 Last data filed at 08/13/2021 1700 Gross per 24 hour  Intake 1468.21 ml  Output --  Net 1468.21 ml   Filed Weights   08/11/21 0033  Weight: 68 kg  Examination: General: NAD  Cardiovascular: S1, S2 present Respiratory: CTAB Abdomen: Soft, nontender, nondistended, bowel sounds present Musculoskeletal: No bilateral pedal edema noted Skin: Normal Psychiatry: Normal mood       Data Reviewed: I  have personally reviewed following labs and imaging studies  CBC: Recent Labs  Lab 08/10/21 1320 08/11/21 0532 08/13/21 0328  WBC 13.3* 12.3* 9.9  NEUTROABS 12.1*  --  6.2  HGB 17.0* 15.8* 15.6*  HCT 49.0* 45.0 47.0*  MCV 86.0 85.2 88.5  PLT 346 284 937   Basic Metabolic Panel: Recent Labs  Lab 08/10/21 1320 08/11/21 0532 08/13/21 0328  NA 143 141 141  K 3.6 3.5 4.2  CL 106 106 108  CO2 26 26 19*  GLUCOSE 133* 82 81  BUN 13 8 14   CREATININE 0.75 0.70 0.70  CALCIUM 9.5 9.1 8.6*   GFR: Estimated Creatinine Clearance: 77.9 mL/min (by C-G formula based on SCr of 0.7 mg/dL). Liver Function Tests: Recent Labs  Lab 08/12/21 0906  PROT 6.8   No results for input(s): LIPASE, AMYLASE in the last 168 hours. No results for input(s): AMMONIA in the last 168 hours. Coagulation Profile: No results for input(s): INR, PROTIME in the last 168 hours. Cardiac Enzymes: No results for input(s): CKTOTAL, CKMB, CKMBINDEX, TROPONINI in the last 168 hours. BNP (last 3 results) No results for input(s): PROBNP in the last 8760 hours. HbA1C: No results for input(s): HGBA1C in the last 72 hours. CBG: No results for input(s): GLUCAP in the last 168 hours. Lipid Profile: No results for input(s): CHOL, HDL, LDLCALC, TRIG, CHOLHDL, LDLDIRECT in the last 72 hours. Thyroid Function Tests: No results for input(s): TSH, T4TOTAL, FREET4, T3FREE, THYROIDAB in the last 72 hours. Anemia Panel: No results for input(s): VITAMINB12, FOLATE, FERRITIN, TIBC, IRON, RETICCTPCT in the last 72 hours. Sepsis Labs: No results for input(s): PROCALCITON, LATICACIDVEN in the last 168 hours.  Recent Results (from the past 240 hour(s))  Resp Panel by RT-PCR (Flu A&B, Covid) Nasopharyngeal Swab     Status: None   Collection Time: 08/10/21  1:13 PM   Specimen: Nasopharyngeal Swab; Nasopharyngeal(NP) swabs in vial transport medium  Result Value Ref Range Status   SARS Coronavirus 2 by RT PCR NEGATIVE NEGATIVE  Final    Comment: (NOTE) SARS-CoV-2 target nucleic acids are NOT DETECTED.  The SARS-CoV-2 RNA is generally detectable in upper respiratory specimens during the acute phase of infection. The lowest concentration of SARS-CoV-2 viral copies this assay can detect is 138 copies/mL. A negative result does not preclude SARS-Cov-2 infection and should not be used as the sole basis for treatment or other patient management decisions. A negative result may occur with  improper specimen collection/handling, submission of specimen other than nasopharyngeal swab, presence of viral mutation(s) within the areas targeted by this assay, and inadequate number of viral copies(<138 copies/mL). A negative result must be combined with clinical observations, patient history, and epidemiological information. The expected result is Negative.  Fact Sheet for Patients:  EntrepreneurPulse.com.au  Fact Sheet for Healthcare Providers:  IncredibleEmployment.be  This test is no t yet approved or cleared by the [place] FDA and  has been authorized for detection and/or diagnosis of SARS-CoV-2 by FDA under an Emergency Use Authorization (EUA). This EUA will remain  in effect (meaning this test can be used) for the duration of the COVID-19 declaration under Section 564(b)(1) of the Act, 21 U.S.C.section 360bbb-3(b)(1), unless the authorization is terminated  or revoked sooner.  Influenza A by PCR NEGATIVE NEGATIVE Final   Influenza B by PCR NEGATIVE NEGATIVE Final    Comment: (NOTE) The Xpert Xpress SARS-CoV-2/FLU/RSV plus assay is intended as an aid in the diagnosis of influenza from Nasopharyngeal swab specimens and should not be used as a sole basis for treatment. Nasal washings and aspirates are unacceptable for Xpert Xpress SARS-CoV-2/FLU/RSV testing.  Fact Sheet for Patients: EntrepreneurPulse.com.au  Fact Sheet for Healthcare  Providers: IncredibleEmployment.be  This test is not yet approved or cleared by the [place] FDA and has been authorized for detection and/or diagnosis of SARS-CoV-2 by FDA under an Emergency Use Authorization (EUA). This EUA will remain in effect (meaning this test can be used) for the duration of the COVID-19 declaration under Section 564(b)(1) of the Act, 21 U.S.C. section 360bbb-3(b)(1), unless the authorization is terminated or revoked.  Performed at [facility], [place] [address], [place], [place] [address]   MRSA Next Gen by PCR, Nasal     Status: None   Collection Time: 08/10/21  9:47 PM   Specimen: Nasal Mucosa; Nasal Swab  Result Value Ref Range Status   MRSA by PCR Next Gen NOT DETECTED NOT DETECTED Final    Comment: (NOTE) The GeneXpert MRSA Assay (FDA approved for NASAL specimens only), is one component of a comprehensive MRSA colonization surveillance program. It is not intended to diagnose MRSA infection nor to guide or monitor treatment for MRSA infections. Test performance is not FDA approved in patients less than 15 years old. Performed at [facility], [place] [address], [place], [place] [address]   Culture, body fluid w Gram Stain-bottle     Status: None (Preliminary result)   Collection Time: 08/11/21  9:35 AM   Specimen: Pleura  Result Value Ref Range Status   Specimen Description PLEURAL FLUID  Final   Special Requests NONE  Final   Culture   Final    NO GROWTH 2 DAYS Performed at [facility] [address], [place], [place] [address]    Report Status PENDING  Incomplete  Gram stain     Status: None   Collection Time: 08/11/21  9:35 AM   Specimen: Pleura  Result Value Ref Range Status   Specimen Description PLEURAL FLUID  Final   Special Requests NONE  Final   Gram Stain   Final    WBC PRESENT, PREDOMINANTLY MONONUCLEAR NO ORGANISMS SEEN CYTOSPIN SMEAR Performed at [facility], [place] [address],  [place], [place] [address]    Report Status 08/11/2021 FINAL  Final  Fungus Culture With Stain     Status: None (Preliminary result)   Collection Time: 08/11/21 11:03 AM   Specimen: PATH Cytology Pleural fluid  Result Value Ref Range Status   Fungus Stain Final report  Final    Comment: (NOTE) Performed At: [facility] [place], [place] [id number] Rush Farmer MD [id number]    Fungus (Mycology) Culture PENDING  Incomplete   Fungal Source THORACIC  Final    Comment: Performed at [facility], [place] [address], [place], [place] [address]  Acid Fast Smear (AFB)     Status: None   Collection Time: 08/11/21 11:03 AM   Specimen: PATH Cytology Pleural fluid  Result Value Ref Range Status   AFB Specimen Processing Concentration  Final   Acid Fast Smear Negative  Final    Comment: (NOTE) Performed At: [facility] [address] [place], [place] [id number] Rush Farmer  MD [id number]    Source (AFB) THORACIC  Final    Comment: Performed at [facility], [place] [address] [place], [place] [address]  Fungus Culture Result     Status: None   Collection Time: 08/11/21 11:03 AM  Result Value Ref Range Status   Result 1 Comment  Final    Comment: (NOTE) KOH/Calcofluor preparation:  no fungus observed. Performed At: [facility] [place], [place] [id number] Rush Farmer MD [id number]          Radiology Studies: No results found.      Scheduled Meds:  amLODipine  5 mg Oral Daily   Continuous Infusions:  ceFEPime (MAXIPIME) IV Stopped (08/13/21 1604)   vancomycin Stopped (08/13/21 1133)     LOS: 3 days     Alma Friendly, MD 08/13/2021, 6:59 PM

## 2021-08-13 NOTE — Plan of Care (Signed)
?  Problem: Activity: ?Goal: Ability to tolerate increased activity will improve ?Outcome: Progressing ?  ?Problem: Activity: ?Goal: Risk for activity intolerance will decrease ?Outcome: Progressing ?  ?

## 2021-08-14 ENCOUNTER — Telehealth: Payer: Self-pay | Admitting: Hematology and Oncology

## 2021-08-14 DIAGNOSIS — L2082 Flexural eczema: Secondary | ICD-10-CM

## 2021-08-14 DIAGNOSIS — I10 Essential (primary) hypertension: Secondary | ICD-10-CM | POA: Diagnosis not present

## 2021-08-14 DIAGNOSIS — Z853 Personal history of malignant neoplasm of breast: Secondary | ICD-10-CM | POA: Diagnosis not present

## 2021-08-14 DIAGNOSIS — J189 Pneumonia, unspecified organism: Secondary | ICD-10-CM | POA: Diagnosis not present

## 2021-08-14 LAB — CBC WITH DIFFERENTIAL/PLATELET
Abs Immature Granulocytes: 0.15 10*3/uL — ABNORMAL HIGH (ref 0.00–0.07)
Basophils Absolute: 0.1 10*3/uL (ref 0.0–0.1)
Basophils Relative: 1 %
Eosinophils Absolute: 0.4 10*3/uL (ref 0.0–0.5)
Eosinophils Relative: 4 %
HCT: 43.8 % (ref 36.0–46.0)
Hemoglobin: 15.2 g/dL — ABNORMAL HIGH (ref 12.0–15.0)
Immature Granulocytes: 1 %
Lymphocytes Relative: 18 %
Lymphs Abs: 1.9 10*3/uL (ref 0.7–4.0)
MCH: 29.5 pg (ref 26.0–34.0)
MCHC: 34.7 g/dL (ref 30.0–36.0)
MCV: 85 fL (ref 80.0–100.0)
Monocytes Absolute: 0.9 10*3/uL (ref 0.1–1.0)
Monocytes Relative: 9 %
Neutro Abs: 7.1 10*3/uL (ref 1.7–7.7)
Neutrophils Relative %: 67 %
Platelets: 231 10*3/uL (ref 150–400)
RBC: 5.15 MIL/uL — ABNORMAL HIGH (ref 3.87–5.11)
RDW: 12.3 % (ref 11.5–15.5)
WBC: 10.5 10*3/uL (ref 4.0–10.5)
nRBC: 0 % (ref 0.0–0.2)

## 2021-08-14 LAB — BASIC METABOLIC PANEL
Anion gap: 8 (ref 5–15)
BUN: 7 mg/dL (ref 6–20)
CO2: 22 mmol/L (ref 22–32)
Calcium: 8.8 mg/dL — ABNORMAL LOW (ref 8.9–10.3)
Chloride: 108 mmol/L (ref 98–111)
Creatinine, Ser: 0.72 mg/dL (ref 0.44–1.00)
GFR, Estimated: >60 mL/min (ref >60)
Glucose, Bld: 82 mg/dL (ref 70–99)
Potassium: 3.4 mmol/L — ABNORMAL LOW (ref 3.5–5.1)
Sodium: 138 mmol/L (ref 135–145)

## 2021-08-14 MED ORDER — POTASSIUM CHLORIDE CRYS ER 20 MEQ PO TBCR
40.0000 meq | EXTENDED_RELEASE_TABLET | Freq: Once | ORAL | Status: AC
Start: 2021-08-14 — End: 2021-08-14
  Administered 2021-08-14: 40 meq via ORAL
  Filled 2021-08-14: qty 2

## 2021-08-14 NOTE — Progress Notes (Signed)
PROGRESS NOTE    Sherry Barry  XFG:[id number] DOB: [birth year] DOA: 08/10/2021 PCP: [facility]    Brief Narrative: Sherry Barry is a 51 y.o. female with medical history significant of HTN, eczema ,hx of breast cancer 9 years ago who presents for evaluation of worsening dyspnea, persistent cough and left-sided pleuritic chest pain for a couple of weeks. Failed outpatient treatment X 2 (completed a course of azithromycin/amoxicillin and a course of doxycycline). She has been treated with Dupixent for her eczema but she stopped taking it few weeks ago as she knew it could weaken her immune system. CT scan chest showed underlying mass or abscess cannot be excluded with sclerotic lesion in T11 vertebral body and T9 vertebral body, which could represent metastatic disease in a patient with history of malignancy. Noted L sided pleural effusion s/p thoracentesis on 08/11/21. Admitted for further management.    Assessment & Plan:   Principal Problem:   CAP (community acquired pneumonia) Active Problems:   Essential hypertension   Eczema   History of breast cancer   Community-acquired pneumonia  ?post-obstructive PNA Failed outpatient treatment X 2 Currently afebrile, with resolved leukocytosis, on RA CT scan chest showed underlying mass or abscess which cannot be excluded with sclerotic lesion in T11 vertebral body and T9 vertebral body, which could represent ?metastatic disease in a patient with history of malignancy Further work up needed to exclude malignancy Discussed with Dr Alvy Bimler on 08/13/21, close follow up with oncology scheduled  Continue IV cefepime given possible post obstructive/failed twice, will require 7 day course of AB, discontinue vancomycin Plan to d/c on Levaquin for a total of 7 days of antibiotics, discussed with ID on 08/14/21, in agreement Neb treatments, Incentive spirometer, supplemental O2 prn Monitor closely  Left  pleural effusion ?parapneumonic effusion Vs post-obstructive PNA S/P left thoracentesis 500 cc of fluid removed Per Lights criteria, exudative Pleural fluid gram stain/culture, no growth, acid fast smear neg, acid fast cx and fungus culture pending Cytology consistent with malignant cells, already discussed with Dr. Alvy Bimler on 08/13/2021, will update oncology about result Monitor closely  Essential hypertension  Continue Norvasc  History of breast cancer status post chemo and radiation    Estimated body mass index is 27 kg/m as calculated from the following:   Height as of this encounter: 5' 2.5" (1.588 m).   Weight as of this encounter: 68 kg.    DVT prophylaxis: Lovenox Code Status: Full code Family Communication: None at bedside Disposition Plan:  Status is: Inpatient Remains inpatient appropriate because: Failed outpatient treatment for community-acquired pneumonia   Consultants:  Interventional radiology  Procedures: Left thoracentesis 08/11/2021 Antimicrobials: Cefepime   Subjective: Patient denies any new complaints, continues to improve from a pulmonary standpoint.    Objective: Vitals:   08/13/21 2117 08/14/21 0447 08/14/21 0915 08/14/21 1640  BP: (!) 130/96 130/90 (!) 133/95 (!) 141/106  Pulse: 81 83 94 100  Resp: 18 19 17 17   Temp: 98.3 F (36.8 C) 98.1 F (36.7 C) 98.8 F (37.1 C) 98.7 F (37.1 C)  TempSrc: Oral Oral Oral Oral  SpO2: 100% 98% 99% 99%  Weight:      Height:        Intake/Output Summary (Last 24 hours) at 08/14/2021 1831 Last data filed at 08/14/2021 1547 Gross per 24 hour  Intake 1430.89 ml  Output --  Net 1430.89 ml   Filed Weights   08/11/21 0033  Weight: 68 kg    Examination: General: NAD  Cardiovascular: S1, S2 present Respiratory: CTAB Abdomen: Soft, nontender, nondistended, bowel sounds present Musculoskeletal: No bilateral pedal edema noted Skin: Normal Psychiatry: Normal mood       Data Reviewed: I have  personally reviewed following labs and imaging studies  CBC: Recent Labs  Lab 08/10/21 1320 08/11/21 0532 08/13/21 0328 08/14/21 0434  WBC 13.3* 12.3* 9.9 10.5  NEUTROABS 12.1*  --  6.2 7.1  HGB 17.0* 15.8* 15.6* 15.2*  HCT 49.0* 45.0 47.0* 43.8  MCV 86.0 85.2 88.5 85.0  PLT 346 284 263 902   Basic Metabolic Panel: Recent Labs  Lab 08/10/21 1320 08/11/21 0532 08/13/21 0328 08/14/21 0434  NA 143 141 141 138  K 3.6 3.5 4.2 3.4*  CL 106 106 108 108  CO2 26 26 19* 22  GLUCOSE 133* 82 81 82  BUN 13 8 14 7   CREATININE 0.75 0.70 0.70 0.72  CALCIUM 9.5 9.1 8.6* 8.8*   GFR: Estimated Creatinine Clearance: 77.9 mL/min (by C-G formula based on SCr of 0.72 mg/dL). Liver Function Tests: Recent Labs  Lab 08/12/21 0906  PROT 6.8   No results for input(s): LIPASE, AMYLASE in the last 168 hours. No results for input(s): AMMONIA in the last 168 hours. Coagulation Profile: No results for input(s): INR, PROTIME in the last 168 hours. Cardiac Enzymes: No results for input(s): CKTOTAL, CKMB, CKMBINDEX, TROPONINI in the last 168 hours. BNP (last 3 results) No results for input(s): PROBNP in the last 8760 hours. HbA1C: No results for input(s): HGBA1C in the last 72 hours. CBG: No results for input(s): GLUCAP in the last 168 hours. Lipid Profile: No results for input(s): CHOL, HDL, LDLCALC, TRIG, CHOLHDL, LDLDIRECT in the last 72 hours. Thyroid Function Tests: No results for input(s): TSH, T4TOTAL, FREET4, T3FREE, THYROIDAB in the last 72 hours. Anemia Panel: No results for input(s): VITAMINB12, FOLATE, FERRITIN, TIBC, IRON, RETICCTPCT in the last 72 hours. Sepsis Labs: No results for input(s): PROCALCITON, LATICACIDVEN in the last 168 hours.  Recent Results (from the past 240 hour(s))  Resp Panel by RT-PCR (Flu A&B, Covid) Nasopharyngeal Swab     Status: None   Collection Time: 08/10/21  1:13 PM   Specimen: Nasopharyngeal Swab; Nasopharyngeal(NP) swabs in vial transport medium   Result Value Ref Range Status   SARS Coronavirus 2 by RT PCR NEGATIVE NEGATIVE Final    Comment: (NOTE) SARS-CoV-2 target nucleic acids are NOT DETECTED.  The SARS-CoV-2 RNA is generally detectable in upper respiratory specimens during the acute phase of infection. The lowest concentration of SARS-CoV-2 viral copies this assay can detect is 138 copies/mL. A negative result does not preclude SARS-Cov-2 infection and should not be used as the sole basis for treatment or other patient management decisions. A negative result may occur with  improper specimen collection/handling, submission of specimen other than nasopharyngeal swab, presence of viral mutation(s) within the areas targeted by this assay, and inadequate number of viral copies(<138 copies/mL). A negative result must be combined with clinical observations, patient history, and epidemiological information. The expected result is Negative.  Fact Sheet for Patients:  EntrepreneurPulse.com.au  Fact Sheet for Healthcare Providers:  IncredibleEmployment.be  This test is no t yet approved or cleared by the [place] FDA and  has been authorized for detection and/or diagnosis of SARS-CoV-2 by FDA under an Emergency Use Authorization (EUA). This EUA will remain  in effect (meaning this test can be used) for the duration of the COVID-19 declaration under Section 564(b)(1) of the Act, 21 U.S.C.section 360bbb-3(b)(1), unless  the authorization is terminated  or revoked sooner.       Influenza A by PCR NEGATIVE NEGATIVE Final   Influenza B by PCR NEGATIVE NEGATIVE Final    Comment: (NOTE) The Xpert Xpress SARS-CoV-2/FLU/RSV plus assay is intended as an aid in the diagnosis of influenza from Nasopharyngeal swab specimens and should not be used as a sole basis for treatment. Nasal washings and aspirates are unacceptable for Xpert Xpress SARS-CoV-2/FLU/RSV testing.  Fact Sheet for  Patients: EntrepreneurPulse.com.au  Fact Sheet for Healthcare Providers: IncredibleEmployment.be  This test is not yet approved or cleared by the [place] FDA and has been authorized for detection and/or diagnosis of SARS-CoV-2 by FDA under an Emergency Use Authorization (EUA). This EUA will remain in effect (meaning this test can be used) for the duration of the COVID-19 declaration under Section 564(b)(1) of the Act, 21 U.S.C. section 360bbb-3(b)(1), unless the authorization is terminated or revoked.  Performed at [facility], [place] [address], [place], [place] [address]   MRSA Next Gen by PCR, Nasal     Status: None   Collection Time: 08/10/21  9:47 PM   Specimen: Nasal Mucosa; Nasal Swab  Result Value Ref Range Status   MRSA by PCR Next Gen NOT DETECTED NOT DETECTED Final    Comment: (NOTE) The GeneXpert MRSA Assay (FDA approved for NASAL specimens only), is one component of a comprehensive MRSA colonization surveillance program. It is not intended to diagnose MRSA infection nor to guide or monitor treatment for MRSA infections. Test performance is not FDA approved in patients less than 41 years old. Performed at [facility], [place] [address] [place], [place] [address]   Culture, body fluid w Gram Stain-bottle     Status: None (Preliminary result)   Collection Time: 08/11/21  9:35 AM   Specimen: Pleura  Result Value Ref Range Status   Specimen Description PLEURAL FLUID  Final   Special Requests NONE  Final   Culture   Final    NO GROWTH 3 DAYS Performed at [facility] [address], [place], [place] [address]    Report Status PENDING  Incomplete  Gram stain     Status: None   Collection Time: 08/11/21  9:35 AM   Specimen: Pleura  Result Value Ref Range Status   Specimen Description PLEURAL FLUID  Final   Special Requests NONE  Final   Gram Stain   Final    WBC PRESENT, PREDOMINANTLY MONONUCLEAR NO  ORGANISMS SEEN CYTOSPIN SMEAR Performed at [facility], [place] [address] [place], [place] [address]    Report Status 08/11/2021 FINAL  Final  Fungus Culture With Stain     Status: None (Preliminary result)   Collection Time: 08/11/21 11:03 AM   Specimen: PATH Cytology Pleural fluid  Result Value Ref Range Status   Fungus Stain Final report  Final    Comment: (NOTE) Performed At: [facility] [place], [place] [id number] Rush Farmer MD [id number]    Fungus (Mycology) Culture PENDING  Incomplete   Fungal Source THORACIC  Final    Comment: Performed at [facility], [place] [address] [place], [place] [address]  Acid Fast Smear (AFB)     Status: None   Collection Time: 08/11/21 11:03 AM   Specimen: PATH Cytology Pleural fluid  Result Value Ref Range Status   AFB Specimen Processing Concentration  Final   Acid Fast Smear Negative  Final    Comment: (  NOTE) Performed At: [facility] [place], [place] [id number] Rush Farmer MD ID:[id number]    Source (AFB) THORACIC  Final    Comment: Performed at [facility], [place] [address] [place], [place] [address]  Fungus Culture Result     Status: None   Collection Time: 08/11/21 11:03 AM  Result Value Ref Range Status   Result 1 Comment  Final    Comment: (NOTE) KOH/Calcofluor preparation:  no fungus observed. Performed At: [facility] [place], [place] [id number] Rush Farmer MD [id number]          Radiology Studies: No results found.      Scheduled Meds:  amLODipine  5 mg Oral Daily   Continuous Infusions:  ceFEPime (MAXIPIME) IV Stopped (08/14/21 1505)     LOS: 4 days     Alma Friendly, MD 08/14/2021, 6:31 PM

## 2021-08-14 NOTE — Telephone Encounter (Signed)
Scheduled appt per 3/2 staff msg from nurse navigator. Pt is aware of appt date and time. Pt is aware to arrive 15 mins prior to appt time and to bring and updated insurance card. Pt is aware of appt location.   ?

## 2021-08-15 DIAGNOSIS — L2082 Flexural eczema: Secondary | ICD-10-CM | POA: Diagnosis not present

## 2021-08-15 DIAGNOSIS — I10 Essential (primary) hypertension: Secondary | ICD-10-CM | POA: Diagnosis not present

## 2021-08-15 DIAGNOSIS — Z853 Personal history of malignant neoplasm of breast: Secondary | ICD-10-CM | POA: Diagnosis not present

## 2021-08-15 DIAGNOSIS — J189 Pneumonia, unspecified organism: Secondary | ICD-10-CM | POA: Diagnosis not present

## 2021-08-15 LAB — CBC WITH DIFFERENTIAL/PLATELET
Abs Immature Granulocytes: 0.13 10*3/uL — ABNORMAL HIGH (ref 0.00–0.07)
Basophils Absolute: 0.1 10*3/uL (ref 0.0–0.1)
Basophils Relative: 1 %
Eosinophils Absolute: 0.4 10*3/uL (ref 0.0–0.5)
Eosinophils Relative: 3 %
HCT: 40.7 % (ref 36.0–46.0)
Hemoglobin: 14.1 g/dL (ref 12.0–15.0)
Immature Granulocytes: 1 %
Lymphocytes Relative: 16 %
Lymphs Abs: 1.7 10*3/uL (ref 0.7–4.0)
MCH: 29.4 pg (ref 26.0–34.0)
MCHC: 34.6 g/dL (ref 30.0–36.0)
MCV: 85 fL (ref 80.0–100.0)
Monocytes Absolute: 0.9 10*3/uL (ref 0.1–1.0)
Monocytes Relative: 9 %
Neutro Abs: 7.3 10*3/uL (ref 1.7–7.7)
Neutrophils Relative %: 70 %
Platelets: 242 10*3/uL (ref 150–400)
RBC: 4.79 MIL/uL (ref 3.87–5.11)
RDW: 12.2 % (ref 11.5–15.5)
WBC: 10.4 10*3/uL (ref 4.0–10.5)
nRBC: 0 % (ref 0.0–0.2)

## 2021-08-15 LAB — BASIC METABOLIC PANEL
Anion gap: 7 (ref 5–15)
BUN: 9 mg/dL (ref 6–20)
CO2: 24 mmol/L (ref 22–32)
Calcium: 8.6 mg/dL — ABNORMAL LOW (ref 8.9–10.3)
Chloride: 107 mmol/L (ref 98–111)
Creatinine, Ser: 0.58 mg/dL (ref 0.44–1.00)
GFR, Estimated: >60 mL/min (ref >60)
Glucose, Bld: 97 mg/dL (ref 70–99)
Potassium: 3.4 mmol/L — ABNORMAL LOW (ref 3.5–5.1)
Sodium: 138 mmol/L (ref 135–145)

## 2021-08-15 MED ORDER — POTASSIUM CHLORIDE CRYS ER 20 MEQ PO TBCR
40.0000 meq | EXTENDED_RELEASE_TABLET | Freq: Once | ORAL | Status: AC
  Administered 2021-08-15: 40 meq via ORAL
  Filled 2021-08-15: qty 2

## 2021-08-15 MED ORDER — LEVOFLOXACIN 750 MG PO TABS: 750.0000 mg | ORAL_TABLET | Freq: Every day | ORAL | 0 refills | Status: AC

## 2021-08-15 NOTE — Progress Notes (Signed)
Patient is being discharged home. Reviewed instructions with patient. Pt verbalized full understanding. Husband is patients ride home. ?

## 2021-08-15 NOTE — Discharge Summary (Signed)
Physician Discharge Summary   Patient: Sherry Barry MRN: [id number] DOB: [birth year]  Admit date:     08/10/2021  Discharge date: 08/15/21  Discharge Physician: Alma Friendly   PCP: [facility] [place]   Recommendations at discharge:   Follow-up with PCP in 1 week Follow-up with oncology as scheduled on 08/28/2021  Discharge Diagnoses: Principal Problem:   CAP (community acquired pneumonia) Active Problems:   Essential hypertension   Eczema   History of breast cancer    Hospital Course: Sherry Barry is a 51 y.o. female with medical history significant of HTN, eczema ,hx of breast cancer 9 years ago who presents for evaluation of worsening dyspnea, persistent cough and left-sided pleuritic chest pain for a couple of weeks. Failed outpatient treatment X 2 (completed a course of azithromycin/amoxicillin and a course of doxycycline). She has been treated with Dupixent for her eczema but she stopped taking it few weeks ago as she knew it could weaken her immune system. CT scan chest showed underlying mass or abscess cannot be excluded with sclerotic lesion in T11 vertebral body and T9 vertebral body, which could represent metastatic disease in a patient with history of malignancy. Noted L sided pleural effusion s/p thoracentesis on 08/11/21. Admitted for further management.    Today, patient denies any new complaints, reports feeling much better.  Discussed about results of cytology which shows malignant cells.  Likely recurrence of breast cancer.  Spoke to Dr. Benay Spice on 08/15/2021 who agreed no further work-up in the hospital and will be followed closely as an outpatient.  Appointment set up for 08/28/2021, patient already aware of the appointment.     Assessment and Plan:  Community-acquired pneumonia  ?post-obstructive PNA Failed outpatient treatment X 2 Currently afebrile, with resolved leukocytosis, on RA CT scan chest showed  underlying mass or abscess which cannot be excluded with sclerotic lesion in T11 vertebral body and T9 vertebral body, which could represent ?metastatic disease in a patient with history of malignancy Discussed with Dr Alvy Bimler on 08/13/21, close follow up with oncology scheduled on 08/28/21 S/P 5 days of IV cefepime, 4 days of IV Vanc (MRSA nares negative), switch to PO levaquin to complete 7 days of AB. 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 and patient was instructed, not to drive, operate heavy machinery, perform activities at heights, swimming or participation in water activities or provide baby-sitting services while on Pain, Sleep and Anxiety Medications; until their outpatient Physician has advised to do so again. Also recommended to not to take more than prescribed Pain, Sleep and Anxiety Medications.     Consultants: None, discussed with oncology and ID over the phone Procedures performed: Left thoracentesis on 08/11/2021 Disposition: Home Diet recommendation:  Regular diet  DISCHARGE MEDICATION: Allergies as of 08/15/2021       Reactions   Codeine Itching, Hives   Latex Hives   Lisinopril Cough   Tomato Itching   Reaction to canned tomatoes  Medication List     STOP taking these medications    doxycycline 100 MG capsule Commonly known as: VIBRAMYCIN   predniSONE 10 MG tablet Commonly known as:  DELTASONE       TAKE these medications    acetaminophen 500 MG tablet Commonly known as: TYLENOL Take 1,000 mg by mouth every 6 (six) hours as needed.   amLODipine 5 MG tablet Commonly known as: NORVASC Take 5 mg by mouth every morning.   benzonatate 200 MG capsule Commonly known as: TESSALON Take 200 mg by mouth 3 (three) times daily.   levofloxacin 750 MG tablet Commonly known as: Levaquin Take 1 tablet (750 mg total) by mouth daily for 2 days. Start taking on: August 16, 2021   promethazine-dextromethorphan 6.25-15 MG/5ML syrup Commonly known as: PROMETHAZINE-DM Take 5 mLs by mouth every 6 (six) hours as needed for cough.        Follow-up [place], [place] @ [place]. Schedule an appointment as soon as possible for a visit in 1 week(s).   Specialty: Family Medicine Contact information: [place] [address] [phone]         [name], MD. Go on 08/28/2021.   Specialty: Hematology and Oncology Contact information: [place] [address] [phone]                 Discharge Exam: Sherry Barry Weights   08/11/21 0033  Weight: 68 kg   General: NAD  Cardiovascular: S1, S2 present Respiratory: CTAB Abdomen: Soft, nontender, nondistended, bowel sounds present Musculoskeletal: No bilateral pedal edema noted Skin: Normal Psychiatry: Normal mood     Condition at discharge: stable    The results of significant diagnostics from this hospitalization (including imaging, microbiology, ancillary and laboratory) are listed below for reference.   Imaging Studies: DG Chest 1 View  Result Date: 08/11/2021 CLINICAL DATA:  Left thoracentesis EXAM: CHEST  1 VIEW COMPARISON:  Chest radiograph 1 day prior FINDINGS: Cardiomediastinal silhouette is normal. The left pleural effusion has decreased in size, with improved aeration of the left base following thoracentesis. There is no appreciable  pneumothorax. The right lung remains clear. There is no right effusion or pneumothorax. The bones are stable. Right axillary surgical clips are again noted. IMPRESSION: Decreased size of the left pleural effusion with improved aeration of the left base following thoracentesis. No appreciable pneumothorax. Electronically Signed   By: Valetta Mole M.D.   On: 08/11/2021 09:28   DG Chest 2 View  Result Date: 08/10/2021 CLINICAL DATA:  Shortness of breath, history of pneumonia EXAM: CHEST - 2 VIEW COMPARISON:  07/24/2021 FINDINGS: The heart size and mediastinal contours are within normal limits. Moderate left pleural effusion associated atelectasis or consolidation, diminished in volume compared to prior examination. The visualized skeletal structures are unremarkable. IMPRESSION: Moderate left pleural effusion associated atelectasis or consolidation, diminished in volume compared to prior examination. No new airspace opacity. Electronically Signed   By: Delanna Ahmadi M.D.   On: 08/10/2021 13:37   DG Chest 2 View  Result Date: 07/24/2021 CLINICAL DATA:  Cough, unspecified type EXAM: CHEST - 2 VIEW COMPARISON:  Radiograph 09/12/2013, chest CT 07/07/2012 FINDINGS: There is a moderate size left pleural effusion with adjacent left lower lung consolidation. The cardiac silhouette is obscured. The right lung is clear. No visible pneumothorax. No acute osseous abnormality. Surgical clips overlie the right chest. IMPRESSION: Moderate size left pleural effusion with adjacent left lower lung consolidation which  could be atelectasis or infection. Electronically Signed   By: Maurine Simmering M.D.   On: 07/24/2021 16:01   CT Angio Chest PE W/Cm &/Or Wo Cm  Result Date: 08/10/2021 CLINICAL DATA:  Pulmonary embolism (PE) suspected, high prob. pneumonia since 2/10 and is not improving. Breast cancer. EXAM: CT ANGIOGRAPHY CHEST WITH CONTRAST TECHNIQUE: Multidetector CT imaging of the chest was performed using the standard protocol  during bolus administration of intravenous contrast. Multiplanar CT image reconstructions and MIPs were obtained to evaluate the vascular anatomy. RADIATION DOSE REDUCTION: This exam was performed according to the departmental dose-optimization program which includes automated exposure control, adjustment of the mA and/or kV according to patient size and/or use of iterative reconstruction technique. CONTRAST:  47m OMNIPAQUE IOHEXOL 350 MG/ML SOLN COMPARISON:  CT chest 07/07/2012. PET CT 07/07/2012 FINDINGS: Cardiovascular: Satisfactory opacification of the pulmonary arteries to the segmental level. No evidence of pulmonary embolism. Normal heart size. No significant pericardial effusion. The thoracic aorta is normal in caliber. No atherosclerotic plaque of the thoracic aorta. No coronary artery calcifications. Mediastinum/Nodes: Right axillary lymphadenectomy. No enlarged mediastinal, hilar, or axillary lymph nodes. Thyroid gland, trachea, and esophagus demonstrate no significant findings. Lungs/Pleura: Partial left lower lobe collapse that appears to be heterogeneous with underlying mass lesion or abscess is not excluded. No focal consolidation. No pulmonary nodule. No pulmonary mass. Small volume left pleural effusion. No pneumothorax. Upper Abdomen: There is a persistent 1.4 cm fluid density lesion within the right hepatic lobe that likely represents a simple hepatic cyst. No acute abnormality. Musculoskeletal: Bilateral breast implants. Interval development of an indeterminate sclerotic lesion along the superior endplate of the TC00vertebral body and posterior aspect of the T9 vertebral body. No acute displaced fracture. Multilevel degenerative changes of the spine. Review of the MIP images confirms the above findings. IMPRESSION: 1. No pulmonary embolus. 2. Small left pleural effusion with associated partial left lower lobe collapse that appears to be heterogeneous. Limited evaluation due to timing of  contrast. Underlying mass lesion or abscess not excluded. 3. Interval development of an indeterminate sclerotic lesion along the superior endplate of the TL49vertebral body and posterior aspect of the T9 vertebral body. Findings could represent metastatic disease in a patient with history of malignancy. Electronically Signed   By: MIven FinnM.D.   On: 08/10/2021 19:15   IR THORACENTESIS ASP PLEURAL SPACE W/IMG GUIDE  Result Date: 08/11/2021 INDICATION: Patient with a history of breast cancer and pneumonia presents today with a left pleural effusion. Interventional radiology asked to perform a diagnostic and therapeutic thoracentesis. EXAM: ULTRASOUND GUIDED THORACENTESIS MEDICATIONS: 1% lidocaine 10 mL COMPLICATIONS: None immediate. PROCEDURE: An ultrasound guided thoracentesis was thoroughly discussed with the patient and questions answered. The benefits, risks, alternatives and complications were also discussed. The patient understands and wishes to proceed with the procedure. Written consent was obtained. Ultrasound was performed to localize and mark an adequate pocket of fluid in the left chest. The area was then prepped and draped in the normal sterile fashion. 1% Lidocaine was used for local anesthesia. Under ultrasound guidance a 6 Fr Safe-T-Centesis catheter was introduced. Thoracentesis was performed. The catheter was removed and a dressing applied. FINDINGS: A total of approximately 500 mL of clear yellow fluid was removed. Samples were sent to the laboratory as requested by the clinical team. Procedure stopped early due to patient coughing and discomfort. IMPRESSION: Successful ultrasound guided left thoracentesis yielding 500 mL of pleural fluid. Read by: JSoyla Dryer NP Electronically Signed  By: Sharen Heck  Mir M.D.   On: 08/11/2021 12:38    Microbiology: Results for orders placed or performed during the hospital encounter of 08/10/21  Resp Panel by RT-PCR (Flu A&B, Covid)  Nasopharyngeal Swab     Status: None   Collection Time: 08/10/21  1:13 PM   Specimen: Nasopharyngeal Swab; Nasopharyngeal(NP) swabs in vial transport medium  Result Value Ref Range Status   SARS Coronavirus 2 by RT PCR NEGATIVE NEGATIVE Final    Comment: (NOTE) SARS-CoV-2 target nucleic acids are NOT DETECTED.  The SARS-CoV-2 RNA is generally detectable in upper respiratory specimens during the acute phase of infection. The lowest concentration of SARS-CoV-2 viral copies this assay can detect is 138 copies/mL. A negative result does not preclude SARS-Cov-2 infection and should not be used as the sole basis for treatment or other patient management decisions. A negative result may occur with  improper specimen collection/handling, submission of specimen other than nasopharyngeal swab, presence of viral mutation(s) within the areas targeted by this assay, and inadequate number of viral copies(<138 copies/mL). A negative result must be combined with clinical observations, patient history, and epidemiological information. The expected result is Negative.  Fact Sheet for Patients:  EntrepreneurPulse.com.au  Fact Sheet for Healthcare Providers:  IncredibleEmployment.be  This test is no t yet approved or cleared by the [place] FDA and  has been authorized for detection and/or diagnosis of SARS-CoV-2 by FDA under an Emergency Use Authorization (EUA). This EUA will remain  in effect (meaning this test can be used) for the duration of the COVID-19 declaration under Section 564(b)(1) of the Act, 21 U.S.C.section 360bbb-3(b)(1), unless the authorization is terminated  or revoked sooner.       Influenza A by PCR NEGATIVE NEGATIVE Final   Influenza B by PCR NEGATIVE NEGATIVE Final    Comment: (NOTE) The Xpert Xpress SARS-CoV-2/FLU/RSV plus assay is intended as an aid in the diagnosis of influenza from Nasopharyngeal swab specimens and should not be  used as a sole basis for treatment. Nasal washings and aspirates are unacceptable for Xpert Xpress SARS-CoV-2/FLU/RSV testing.  Fact Sheet for Patients: EntrepreneurPulse.com.au  Fact Sheet for Healthcare Providers: IncredibleEmployment.be  This test is not yet approved or cleared by the [place] FDA and has been authorized for detection and/or diagnosis of SARS-CoV-2 by FDA under an Emergency Use Authorization (EUA). This EUA will remain in effect (meaning this test can be used) for the duration of the COVID-19 declaration under Section 564(b)(1) of the Act, 21 U.S.C. section 360bbb-3(b)(1), unless the authorization is terminated or revoked.  Performed at [facility], [place] [address] [place], [place] [address]   MRSA Next Gen by PCR, Nasal     Status: None   Collection Time: 08/10/21  9:47 PM   Specimen: Nasal Mucosa; Nasal Swab  Result Value Ref Range Status   MRSA by PCR Next Gen NOT DETECTED NOT DETECTED Final    Comment: (NOTE) The GeneXpert MRSA Assay (FDA approved for NASAL specimens only), is one component of a comprehensive MRSA colonization surveillance program. It is not intended to diagnose MRSA infection nor to guide or monitor treatment for MRSA infections. Test performance is not FDA approved in patients less than 63 years old. Performed at [facility], [place] [address], [place], [place] [address]   Culture, body fluid w Gram Stain-bottle     Status: None (Preliminary result)   Collection Time: 08/11/21  9:35 AM   Specimen: Pleura  Result Value Ref Range  Status   Specimen Description PLEURAL FLUID  Final   Special Requests NONE  Final   Culture   Final    NO GROWTH 4 DAYS Performed at [place] [address] [place], [place] [address]    Report Status PENDING  Incomplete  Gram stain     Status: None   Collection Time: 08/11/21  9:35 AM   Specimen: Pleura  Result Value Ref Range Status    Specimen Description PLEURAL FLUID  Final   Special Requests NONE  Final   Gram Stain   Final    WBC PRESENT, PREDOMINANTLY MONONUCLEAR NO ORGANISMS SEEN CYTOSPIN SMEAR Performed at [facility], [place] [address], [place], [place] [address]    Report Status 08/11/2021 FINAL  Final  Fungus Culture With Stain     Status: None (Preliminary result)   Collection Time: 08/11/21 11:03 AM   Specimen: PATH Cytology Pleural fluid  Result Value Ref Range Status   Fungus Stain Final report  Final    Comment: (NOTE) Performed At: [facility] [place], [place] [id number] Rush Farmer MD ME:[id number]    Fungus (Mycology) Culture PENDING  Incomplete   Fungal Source THORACIC  Final    Comment: Performed at [facility], [place] [address] [place], [place] [address]  Acid Fast Smear (AFB)     Status: None   Collection Time: 08/11/21 11:03 AM   Specimen: PATH Cytology Pleural fluid  Result Value Ref Range Status   AFB Specimen Processing Concentration  Final   Acid Fast Smear Negative  Final    Comment: (NOTE) Performed At: [facility] [place], [place] [id number] Rush Farmer MD YC:[id number]    Source (AFB) THORACIC  Final    Comment: Performed at [facility], [place] [address] [place], [place] [address]  Fungus Culture Result     Status: None   Collection Time: 08/11/21 11:03 AM  Result Value Ref Range Status   Result 1 Comment  Final    Comment: (NOTE) KOH/Calcofluor preparation:  no fungus observed. Performed At: [facility] [place], [place] [id number] Rush Farmer MD YD:[id number]     Labs: CBC: Recent Labs  Lab 08/10/21 1320 08/11/21 0532 08/13/21 0328 08/14/21 0434 08/15/21 0138  WBC 13.3* 12.3* 9.9 10.5 10.4  NEUTROABS 12.1*  --  6.2 7.1 7.3  HGB 17.0* 15.8* 15.6* 15.2* 14.1  HCT 49.0* 45.0 47.0* 43.8 40.7  MCV 86.0 85.2 88.5 85.0 85.0  PLT 346 284 263 231 720   Basic  Metabolic Panel: Recent Labs  Lab 08/10/21 1320 08/11/21 0532 08/13/21 0328 08/14/21 0434 08/15/21 0138  NA 143 141 141 138 138  K 3.6 3.5 4.2 3.4* 3.4*  CL 106 106 108 108 107  CO2 26 26 19* 22 24  GLUCOSE 133* 82 81 82 97  BUN '13 8 14 7 9  '$ CREATININE 0.75 0.70 0.70 0.72 0.58  CALCIUM 9.5 9.1 8.6* 8.8* 8.6*   Liver Function Tests: Recent Labs  Lab 08/12/21 0906  PROT 6.8   CBG: No results for input(s): GLUCAP in the last 168 hours.  Discharge time spent: greater than 30 minutes.  Signed: Alma Friendly, MD [facility] 08/15/2021

## 2021-08-16 LAB — CULTURE, BODY FLUID W GRAM STAIN -BOTTLE: Culture: NO GROWTH

## 2021-08-20 DIAGNOSIS — J90 Pleural effusion, not elsewhere classified: Secondary | ICD-10-CM | POA: Diagnosis not present

## 2021-08-20 DIAGNOSIS — J189 Pneumonia, unspecified organism: Secondary | ICD-10-CM | POA: Diagnosis not present

## 2021-08-20 DIAGNOSIS — C7800 Secondary malignant neoplasm of unspecified lung: Secondary | ICD-10-CM | POA: Diagnosis not present

## 2021-08-28 ENCOUNTER — Inpatient Hospital Stay
Payer: Federal, State, Local not specified - PPO | Attending: Hematology and Oncology | Admitting: Hematology and Oncology

## 2021-08-28 ENCOUNTER — Inpatient Hospital Stay: Payer: Federal, State, Local not specified - PPO

## 2021-08-28 ENCOUNTER — Other Ambulatory Visit: Payer: Self-pay | Admitting: *Deleted

## 2021-08-28 ENCOUNTER — Encounter: Payer: Self-pay | Admitting: Hematology and Oncology

## 2021-08-28 ENCOUNTER — Inpatient Hospital Stay: Payer: Federal, State, Local not specified - PPO | Admitting: Emergency Medicine

## 2021-08-28 ENCOUNTER — Other Ambulatory Visit: Payer: Self-pay

## 2021-08-28 VITALS — BP 144/100 | HR 95 | Temp 97.9°F | Resp 18 | Ht 62.0 in | Wt 149.0 lb

## 2021-08-28 DIAGNOSIS — I10 Essential (primary) hypertension: Secondary | ICD-10-CM | POA: Insufficient documentation

## 2021-08-28 DIAGNOSIS — Z79899 Other long term (current) drug therapy: Secondary | ICD-10-CM | POA: Diagnosis not present

## 2021-08-28 DIAGNOSIS — C7951 Secondary malignant neoplasm of bone: Secondary | ICD-10-CM | POA: Insufficient documentation

## 2021-08-28 DIAGNOSIS — Z170 Estrogen receptor positive status [ER+]: Secondary | ICD-10-CM | POA: Insufficient documentation

## 2021-08-28 DIAGNOSIS — C50919 Malignant neoplasm of unspecified site of unspecified female breast: Secondary | ICD-10-CM

## 2021-08-28 DIAGNOSIS — C50211 Malignant neoplasm of upper-inner quadrant of right female breast: Secondary | ICD-10-CM | POA: Insufficient documentation

## 2021-08-28 DIAGNOSIS — C7800 Secondary malignant neoplasm of unspecified lung: Secondary | ICD-10-CM | POA: Diagnosis not present

## 2021-08-28 DIAGNOSIS — Z9221 Personal history of antineoplastic chemotherapy: Secondary | ICD-10-CM | POA: Diagnosis not present

## 2021-08-28 DIAGNOSIS — Z9013 Acquired absence of bilateral breasts and nipples: Secondary | ICD-10-CM | POA: Diagnosis not present

## 2021-08-28 DIAGNOSIS — Z923 Personal history of irradiation: Secondary | ICD-10-CM | POA: Insufficient documentation

## 2021-08-28 LAB — CBC WITH DIFFERENTIAL/PLATELET
Abs Immature Granulocytes: 0.04 10*3/uL (ref 0.00–0.07)
Basophils Absolute: 0 10*3/uL (ref 0.0–0.1)
Basophils Relative: 0 %
Eosinophils Absolute: 0.1 10*3/uL (ref 0.0–0.5)
Eosinophils Relative: 1 %
HCT: 44 % (ref 36.0–46.0)
Hemoglobin: 14.8 g/dL (ref 12.0–15.0)
Immature Granulocytes: 1 %
Lymphocytes Relative: 16 %
Lymphs Abs: 1.3 10*3/uL (ref 0.7–4.0)
MCH: 29 pg (ref 26.0–34.0)
MCHC: 33.6 g/dL (ref 30.0–36.0)
MCV: 86.1 fL (ref 80.0–100.0)
Monocytes Absolute: 0.4 10*3/uL (ref 0.1–1.0)
Monocytes Relative: 5 %
Neutro Abs: 6.2 10*3/uL (ref 1.7–7.7)
Neutrophils Relative %: 77 %
Platelets: 301 10*3/uL (ref 150–400)
RBC: 5.11 MIL/uL (ref 3.87–5.11)
RDW: 12.4 % (ref 11.5–15.5)
WBC: 8.1 10*3/uL (ref 4.0–10.5)
nRBC: 0 % (ref 0.0–0.2)

## 2021-08-28 LAB — COMPREHENSIVE METABOLIC PANEL
ALT: 11 U/L (ref 0–44)
AST: 14 U/L — ABNORMAL LOW (ref 15–41)
Albumin: 4.1 g/dL (ref 3.5–5.0)
Alkaline Phosphatase: 60 U/L (ref 38–126)
Anion gap: 6 (ref 5–15)
BUN: 14 mg/dL (ref 6–20)
CO2: 27 mmol/L (ref 22–32)
Calcium: 9.3 mg/dL (ref 8.9–10.3)
Chloride: 108 mmol/L (ref 98–111)
Creatinine, Ser: 0.81 mg/dL (ref 0.44–1.00)
GFR, Estimated: >60 mL/min (ref >60)
Glucose, Bld: 86 mg/dL (ref 70–99)
Potassium: 3.7 mmol/L (ref 3.5–5.1)
Sodium: 141 mmol/L (ref 135–145)
Total Bilirubin: 0.4 mg/dL (ref 0.3–1.2)
Total Protein: 7.4 g/dL (ref 6.5–8.1)

## 2021-08-28 NOTE — Progress Notes (Signed)
[facility] ?CONSULT NOTE ? ?Patient Care Team: ?[name], NP as PCP - General (Family Medicine) ?Magrinat, Virgie Dad, MD (Inactive) as Consulting Physician (Oncology) ?Servando Salina, MD as Consulting Physician (Obstetrics and Gynecology) ? ?CHIEF COMPLAINTS/PURPOSE OF CONSULTATION:  ?Newly diagnosed breast cancer ? ?HISTORY OF PRESENTING ILLNESS:  ?Sherry Barry 51 y.o. female is here because of recent diagnosis of recurrent breast cancer. ? ?I reviewed her records extensively and collaborated the history with the patient. ? ?SUMMARY OF ONCOLOGIC HISTORY: ?Oncology History  ?Malignant neoplasm of upper-inner quadrant of right breast in female, estrogen receptor positive ([place])  ?07/13/2012 Clinical Stage  ? Stage IA: T1c N0 ?  ?07/14/2012 Definitive Surgery  ? Bilateral mastectomy/right SLNB: RIGHT invasive ductal carcinoma, grade 3, ER+, PR +, Her2/neu positive (ratio 3.02), Ki67 48%. DCIS. 1/4 LN positive for malignancy. LEFT: benign ?  ?07/14/2012 Pathologic Stage  ? Stage IIB: T2 N1a M0 ?  ?08/17/2012 - 08/30/2013 Chemotherapy  ? Adjuvant carboplatin, docetaxel, and trastuzumab x 6 cycles (completed 11/30/2012) followed by maintenance trastuzumab to total one year ?  ?11/2012 Procedure  ? Comp Cancer Gene panel ([facility]) negative for deleterious mutations ?  ? - 02/2013 Radiation Therapy  ? Adjuvant RT to right breast ?  ?02/2013 -  Anti-estrogen oral therapy  ? Tamoxifen 20 mg daily ?  ?09/24/2013 Surgery  ? Bilateral breast reconstruction with latissmus flap and expander placement ?  ?12/12/2013 Surgery  ? Implant placement ?  ? ?Sherry Barry was recently admitted with chief complaint of worsening dyspnea, cough, left-sided pleuritic chest pain.  She was given 2 courses of antibiotics but failed to recover.  She had a CT scan which showed possible sclerotic lesion at T9 and T11 vertebral body as well as left-sided pleural effusion.  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  Prognostics were not repeated.  She denies any symptoms currently.  Her shortness of breath is much better.  She occasionally has pleuritic chest pain but no pain in her spine.  She denies any change in her bowel habits or urinary habits.  No headaches, change in balance, coordination or speech.  No new neurological complaints. ? ?Rest of the pertinent 10 point ROS reviewed and negative. ? ?MEDICAL HISTORY:  ?Past Medical History:  ?Diagnosis Date  ? Allergy   ? Breast cancer ([place])   ? Breast cancer ([place])   ? right  ? History of chemotherapy   ? doxetaxel/carboplatin/trastuzumab  ? History of migraine   ? last one about a week ago  ? Hx of radiation therapy 01/01/13- 02/15/13  ? r chest wall, r supraclav/axilla 5040 cGy/28 sessions, scar boost 1000 cGy/5 sessions  ? Hypertension   ? no meds,   urgent care on [place]  ? Migraine   ? Migraine   ? ? ?SURGICAL HISTORY: ?Past Surgical History:  ?Procedure Laterality Date  ? ABDOMINAL HYSTERECTOMY    ? no salpingo-oophorectomy 2009  ? BREAST RECONSTRUCTION WITH PLACEMENT OF TISSUE EXPANDER AND FLEX HD (ACELLULAR HYDRATED DERMIS) Left 09/24/2013  ? IR THORACENTESIS ASP PLEURAL SPACE W/IMG GUIDE  08/11/2021  ? LATISSIMUS FLAP TO BREAST Right 09/24/2013  ? Procedure: RIGHT LATISSMUS MYOCUTAEIOUS MUSCLE FLAP AND PLACEMENT OF TISSUE [name];  Surgeon: Theodoro Kos, DO;  Location: [facility];  Service: Plastics;  Laterality: Right;  ? LIPOSUCTION WITH LIPOFILLING Bilateral 12/12/2013  ? Procedure: LIPOSUCTION WITH LIPOFILLING;  Surgeon: Theodoro Kos, DO;  Location: [facility];  Service: Plastics;  Laterality: Bilateral;  ?  PORT-A-CATH REMOVAL Left 12/12/2013  ? Procedure: REMOVAL PORT-A-CATH;  Surgeon: Theodoro Kos, DO;  Location: [facility];  Service: Plastics;  Laterality: Left;  ? PORTACATH PLACEMENT  07/14/2012  ? Procedure: INSERTION PORT-A-CATH;  Surgeon: Joyice Faster. Cornett, MD;  Location: [facility];  Service: General;   Laterality: Left;  ? RECONSTRUCTION BREAST W/ LATISSIMUS DORSI FLAP Right 09/24/2013  ? "& tissue expander placement"  ? REMOVAL OF BILATERAL TISSUE EXPANDERS WITH PLACEMENT OF BILATERAL BREAST IMPLANTS Bilateral 12/12/2013  ? Procedure: REMOVAL OF BILATERAL TISSUE EXPANDERS WITH PLACEMENT OF BILATERAL BREAST IMPLANTS/BILATERAL CAPSULECTOMIES WITH  LIPOFILLING FAT GRAFTING;  Surgeon: Theodoro Kos, DO;  Location: [facility];  Service: Plastics;  Laterality: Bilateral;  ? SIMPLE MASTECTOMY WITH AXILLARY SENTINEL NODE BIOPSY  07/14/2012  ? Procedure: SIMPLE MASTECTOMY WITH AXILLARY SENTINEL NODE BIOPSY;  Surgeon: Joyice Faster. Cornett, MD;  Location: [facility];  Service: General;  Laterality: Right;  Bilateral simple mastectomy with port and right sebtibel lymph node mapping  ? SIMPLE MASTECTOMY WITH AXILLARY SENTINEL NODE BIOPSY  07/14/2012  ? Procedure: SIMPLE MASTECTOMY;  Surgeon: Joyice Faster. Cornett, MD;  Location: [facility];  Service: General;  Laterality: Left;  ? TISSUE EXPANDER PLACEMENT Left 09/24/2013  ? Procedure: PLACEMENT OF TISSUE EXPANDER AND FLEX HD TO LEFT BREAST;  Surgeon: Theodoro Kos, DO;  Location: [facility];  Service: Plastics;  Laterality: Left;  ? ? ?SOCIAL HISTORY: ?Social History  ? ?Socioeconomic History  ? Marital status: Married  ?  Spouse name: Not on file  ? Number of children: 2  ? Years of education: Not on file  ? Highest education level: Not on file  ?Occupational History  ?  Employer: [employer]  ?Tobacco Use  ? Smoking status: Never  ? Smokeless tobacco: Never  ?Substance and Sexual Activity  ? Alcohol use: Yes  ?  Comment: occasional  ? Drug use: No  ? Sexual activity: Yes  ?  Birth control/protection: Surgical  ?Other Topics Concern  ? Not on file  ?Social History Narrative  ? Not on file  ? ?Social Determinants of Health  ? ?Financial Resource Strain: Not on file  ?Food Insecurity: Not on file  ?Transportation Needs: Not on file  ?Physical Activity: Not on file  ?Stress: Not on file   ?Social Connections: Not on file  ?Intimate Partner Violence: Not on file  ? ? ?FAMILY HISTORY: ?Family History  ?Problem Relation Age of Onset  ? Hypertension Mother   ? Alcohol abuse Mother   ? Heart disease Maternal Grandmother   ? Stroke Maternal Grandfather   ? Colon cancer Maternal Aunt 82  ?     alive at 87  ? Brain cancer Maternal Uncle 24  ?     and lymphoma in early 24s; deceased  ? Brain cancer Maternal Uncle 60  ?     deceased  ? Pancreatic cancer Maternal Uncle 48  ?     alive at 88  ? Breast cancer Cousin 48  ?     mat 1st cousin once removed through mat GF ; deceased  ? Breast cancer Maternal Aunt   ?     great aunt through mat GF; dx at ? age  ? ? ?ALLERGIES:  is allergic to codeine, hydrocodone, latex, lisinopril, and tomato. ? ?MEDICATIONS:  ?Current Outpatient Medications  ?Medication Sig Dispense Refill  ? acetaminophen (TYLENOL) 500 MG tablet Take 1,000 mg by mouth every 6 (six) hours as needed.    ?  amLODipine (NORVASC) 5 MG tablet Take 5 mg by mouth every morning.    ? benzonatate (TESSALON) 200 MG capsule Take 200 mg by mouth 3 (three) times daily.    ? promethazine-dextromethorphan (PROMETHAZINE-DM) 6.25-15 MG/5ML syrup Take 5 mLs by mouth every 6 (six) hours as needed for cough.    ? ?No current facility-administered medications for this visit.  ? ? ?REVIEW OF SYSTEMS:   ?Constitutional: Denies fevers, chills or abnormal night sweats ?Eyes: Denies blurriness of vision, double vision or watery eyes ?Ears, nose, mouth, throat, and face: Denies mucositis or sore throat ?Respiratory: Denies cough, dyspnea or wheezes.  Reports occasional left-sided pleuritic chest pain ?Cardiovascular: Denies palpitation, chest discomfort or lower extremity swelling ?Gastrointestinal:  Denies nausea, heartburn or change in bowel habits ?Skin: Denies abnormal skin rashes ?Lymphatics: Denies new lymphadenopathy or easy bruising ?Neurological:Denies numbness, tingling or new weaknesses ?Behavioral/Psych: Mood is  stable, no new changes  ?Breast: Denies any palpable lumps or discharge ?All other systems were reviewed with the patient and are negative. ? ?PHYSICAL EXAMINATION: ?ECOG PERFORMANCE STATUS: 0 - Asymptomatic ?

## 2021-08-28 NOTE — Research (Signed)
[employer] 2021-05 - Specimen Collection Study to Evaluate Biomarkers in Subjects with Cancer  ? ?INTRO STUDY/CONSENT ? ?Patient Sherry Barry was identified by MD Iruku as a potential candidate for the above listed study.  This Clinical Research Nurse met with Sherry Barry, [id number], on 08/28/21 in a manner and location that ensures patient privacy to discuss participation in the above listed research study.  Patient is Accompanied by spouse Christopher .  A copy of the informed consent document with embedded HIPAA language was provided to the patient.  Patient reads, speaks, and understands English.   ?Patient was provided with the business card of this Nurse and encouraged to contact the research team with any questions.  Approximately 15 minutes were spent with the patient reviewing the informed consent documents.  Patient was provided the option of taking informed consent documents home to review and was encouraged to review at their convenience with their support network, including other care providers. Patient took the consent documents home to review.  Will f/u with patient in the next few days to determine interest. ? ?Rebecca 'Liza' Segal, RN, BSN ?Clinical Research Nurse I ?08/28/21 ?11:52 AM ? ? ? ?

## 2021-08-28 NOTE — Assessment & Plan Note (Signed)
This is a very pleasant 51 year old perimenopausal female patient with past medical history significant for right sided breast cancer, grade 3, triple positive at diagnosis status post bilateral mastectomy and right sentinel lymph node biopsy followed by adjuvant chemotherapy with carboplatin, docetaxel and trastuzumab for 6 cycles followed by maintenance trastuzumab for 1 year, adjuvant radiation, tamoxifen for 5 years now presents with recurrent breast cancer.  She initially presented to the hospital with chief complaint of pleuritic chest pain, cough and dyspnea and was found to have left-sided pleural effusion and a couple sclerotic lesions in the spine.  Cytology from the left-sided pleural effusion suggested metastatic adenocarcinoma, prognostics pending. ?She is clinically asymptomatic except for the intermittent left-sided pleuritic chest pain.  Physical examination unremarkable except for left pleural effusion.  We have recommended to proceed with PET/CT and MRI brain for complete staging given triple positive breast cancer.  Once prognostics are repeated and available, we will formulate a plan of care.  If this is indeed triple positive like in the past, we could consider chemotherapy docetaxel, Herceptin and pertuzumab frontline.  I have discussed that this is stage IV cancer however people can attain long-term remissions and there are excellent options for treatment.  She understands that this cannot be cured.  All her questions were answered to the best of my knowledge. ?

## 2021-09-02 ENCOUNTER — Telehealth: Payer: Self-pay | Admitting: *Deleted

## 2021-09-02 ENCOUNTER — Telehealth: Payer: Self-pay | Admitting: Emergency Medicine

## 2021-09-02 DIAGNOSIS — J90 Pleural effusion, not elsewhere classified: Secondary | ICD-10-CM

## 2021-09-02 DIAGNOSIS — Z170 Estrogen receptor positive status [ER+]: Secondary | ICD-10-CM

## 2021-09-02 DIAGNOSIS — C50919 Malignant neoplasm of unspecified site of unspecified female breast: Secondary | ICD-10-CM

## 2021-09-02 NOTE — Telephone Encounter (Signed)
[employer] 2021-05 - Specimen Collection Study to Evaluate Biomarkers in Subjects with Cancer  ? ?Called to f/u on potential interest in study.  Patient states she is interested at this time.  Pt agreed to Research following up with her on 09/18/21 during Dr. Rob Hickman appt to discuss next steps.  Pt denies any questions/concerns at this time but is aware to contact Research as needed before then. ? ?Wells Guiles 'Learta Codding' Destine Ambroise, RN, BSN ?Clinical Research Nurse I ?09/02/21 ?2:38 PM ? ?

## 2021-09-03 ENCOUNTER — Ambulatory Visit (HOSPITAL_COMMUNITY)
Admission: RE | Admit: 2021-09-03 | Discharge: 2021-09-03 | Disposition: A | Discharge status: Home / Self Care | Payer: Federal, State, Local not specified - PPO | Source: Ambulatory Visit | Attending: Physician Assistant | Admitting: Physician Assistant

## 2021-09-03 ENCOUNTER — Other Ambulatory Visit: Payer: Self-pay

## 2021-09-03 ENCOUNTER — Ambulatory Visit (HOSPITAL_COMMUNITY)
Admission: RE | Admit: 2021-09-03 | Discharge: 2021-09-03 | Disposition: A | Discharge status: Home / Self Care | Payer: Federal, State, Local not specified - PPO | Source: Ambulatory Visit | Attending: Hematology and Oncology | Admitting: Hematology and Oncology

## 2021-09-03 DIAGNOSIS — C50919 Malignant neoplasm of unspecified site of unspecified female breast: Secondary | ICD-10-CM | POA: Diagnosis not present

## 2021-09-03 DIAGNOSIS — J90 Pleural effusion, not elsewhere classified: Secondary | ICD-10-CM | POA: Insufficient documentation

## 2021-09-03 DIAGNOSIS — Z170 Estrogen receptor positive status [ER+]: Secondary | ICD-10-CM | POA: Diagnosis not present

## 2021-09-03 DIAGNOSIS — J9811 Atelectasis: Secondary | ICD-10-CM | POA: Diagnosis not present

## 2021-09-03 DIAGNOSIS — C50211 Malignant neoplasm of upper-inner quadrant of right female breast: Secondary | ICD-10-CM | POA: Diagnosis not present

## 2021-09-03 MED ORDER — LIDOCAINE HCL 1 % IJ SOLN
INTRAMUSCULAR | Status: AC
  Filled 2021-09-03: qty 20

## 2021-09-03 NOTE — Procedures (Signed)
PROCEDURE SUMMARY: ? ?Successful US guided left thoracentesis. ?Yielded 800cc of yellow fluid. ?Pt tolerated procedure well. ?No immediate complications. ? ?Specimen not sent for labs. ?CXR ordered. ? ?EBL < 5 mL ? ?Marissah Vandemark PA-C ?09/03/2021 ?11:59 AM ? ? ? ?

## 2021-09-03 NOTE — Telephone Encounter (Signed)
No entry 

## 2021-09-07 ENCOUNTER — Other Ambulatory Visit: Payer: Self-pay

## 2021-09-07 ENCOUNTER — Ambulatory Visit (HOSPITAL_COMMUNITY)
Admission: RE | Admit: 2021-09-07 | Discharge: 2021-09-07 | Disposition: A | Discharge status: Home / Self Care | Payer: Federal, State, Local not specified - PPO | Source: Ambulatory Visit | Attending: Hematology and Oncology | Admitting: Hematology and Oncology

## 2021-09-07 DIAGNOSIS — C50919 Malignant neoplasm of unspecified site of unspecified female breast: Secondary | ICD-10-CM | POA: Diagnosis not present

## 2021-09-07 DIAGNOSIS — Q283 Other malformations of cerebral vessels: Secondary | ICD-10-CM | POA: Diagnosis not present

## 2021-09-07 MED ORDER — GADOBUTROL 1 MMOL/ML IV SOLN
7.0000 mL | Freq: Once | INTRAVENOUS | Status: AC | PRN
  Administered 2021-09-07: 7 mL via INTRAVENOUS

## 2021-09-09 LAB — FUNGUS CULTURE WITH STAIN

## 2021-09-09 LAB — FUNGUS CULTURE RESULT

## 2021-09-09 LAB — FUNGAL ORGANISM REFLEX

## 2021-09-16 ENCOUNTER — Ambulatory Visit (HOSPITAL_COMMUNITY)
Admission: RE | Admit: 2021-09-16 | Discharge: 2021-09-16 | Disposition: A | Discharge status: Home / Self Care | Payer: Federal, State, Local not specified - PPO | Source: Ambulatory Visit | Attending: Hematology and Oncology | Admitting: Hematology and Oncology

## 2021-09-16 DIAGNOSIS — J181 Lobar pneumonia, unspecified organism: Secondary | ICD-10-CM | POA: Diagnosis not present

## 2021-09-16 DIAGNOSIS — K5730 Diverticulosis of large intestine without perforation or abscess without bleeding: Secondary | ICD-10-CM | POA: Diagnosis not present

## 2021-09-16 DIAGNOSIS — J949 Pleural condition, unspecified: Secondary | ICD-10-CM | POA: Diagnosis not present

## 2021-09-16 DIAGNOSIS — C50919 Malignant neoplasm of unspecified site of unspecified female breast: Secondary | ICD-10-CM | POA: Diagnosis not present

## 2021-09-16 LAB — GLUCOSE, CAPILLARY: Glucose-Capillary: 89 mg/dL (ref 70–99)

## 2021-09-16 IMAGING — PT NM PET TUM IMG RESTAG (PS) SKULL BASE T - THIGH
1 series · 11 of 11 positions shown · non-contrast
Comparison: Multiple prior studies most recent comparison from
August 10, 2021. PET exam from 8191 also available in PACS.

CLINICAL DATA: Initial treatment strategy for recurrent breast
cancer in a 51-year-old female.

EXAM:
NUCLEAR MEDICINE PET SKULL BASE TO THIGH
TECHNIQUE: 7.35 mCi F-18 FDG was injected intravenously. Full-ring PET imaging
was performed from the skull base to thigh after the radiotracer. CT
data was obtained and used for attenuation correction and anatomic
localization.
Fasting blood glucose: 89 mg/dl

[Series 1062: results mm oncology reading · 1.0mm · 0.45mm/px · 11 of 11 slices shown]
[im 1/11]
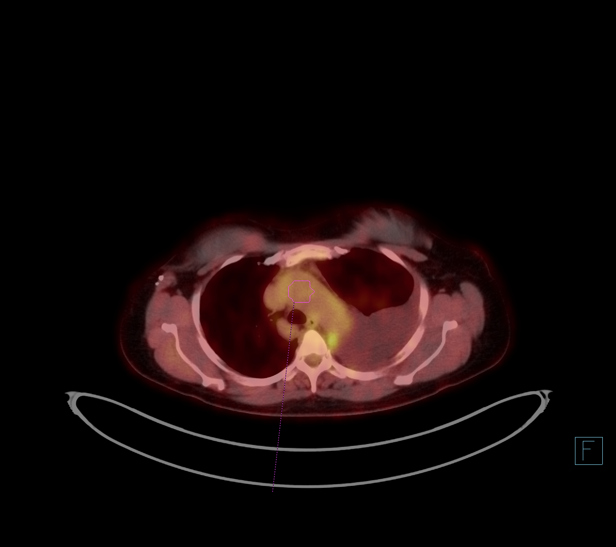
[im 2/11]
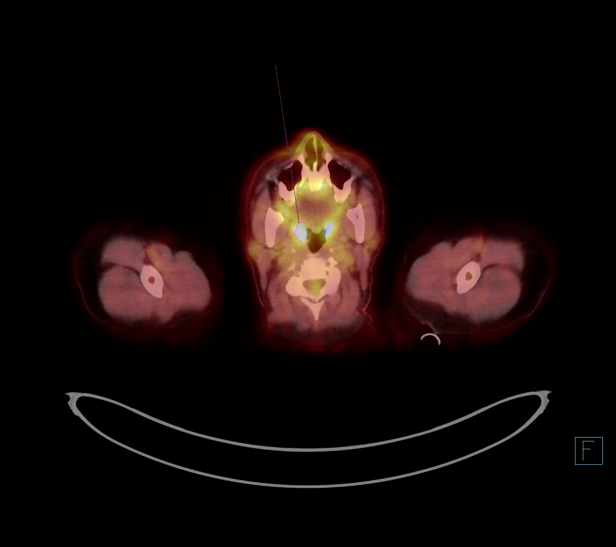
[im 3/11]
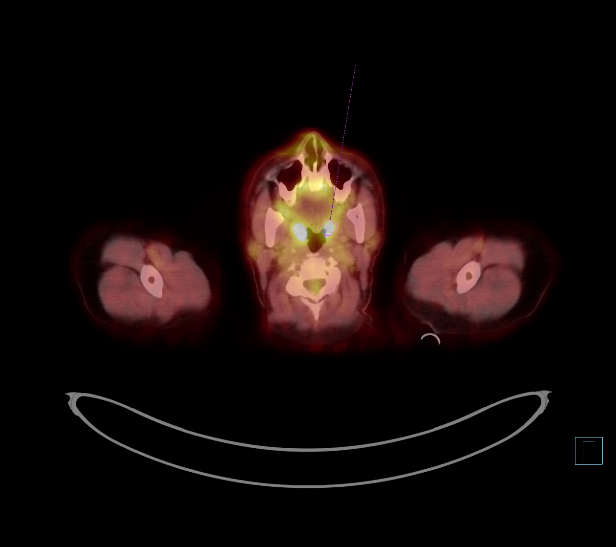
[im 4/11]
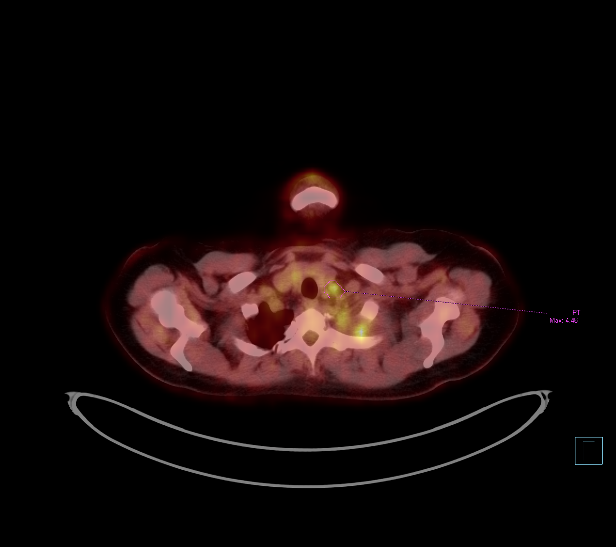
[im 5/11]
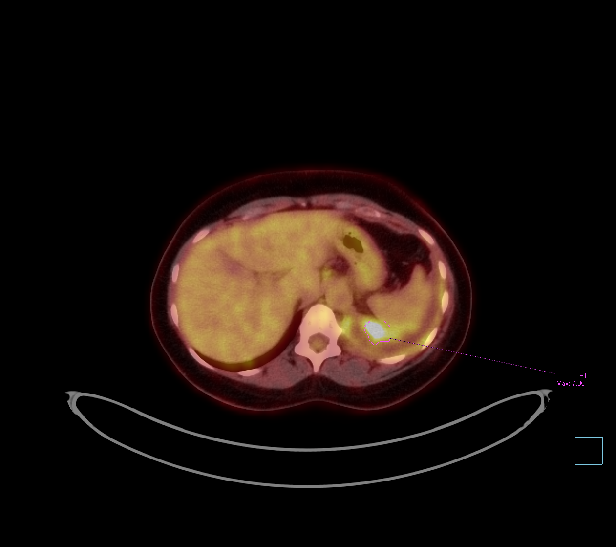
[im 6/11]
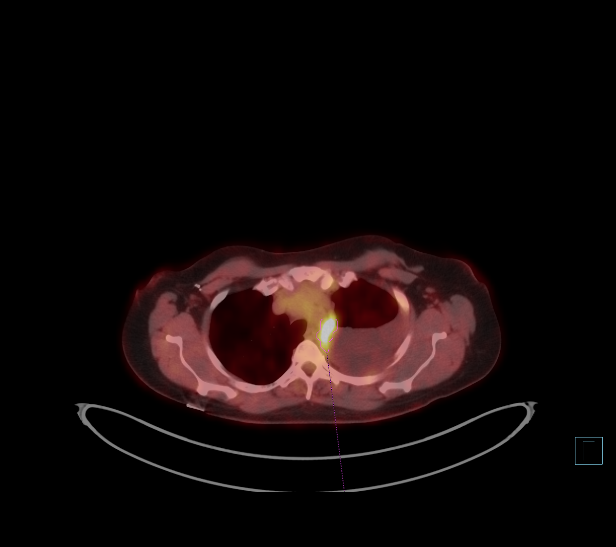
[im 7/11]
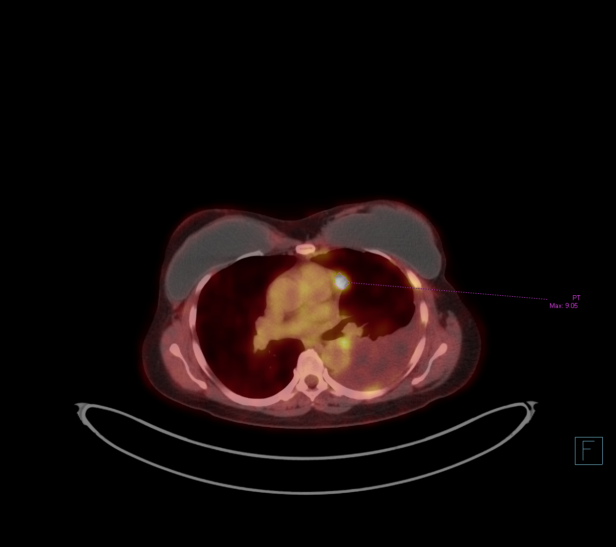
[im 8/11]
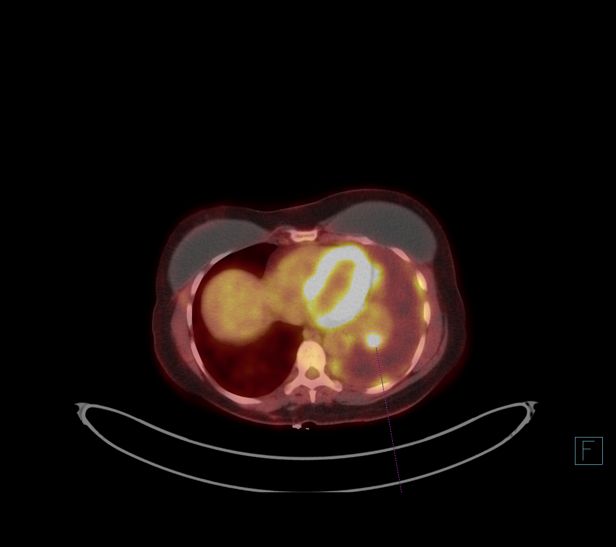
[im 9/11]
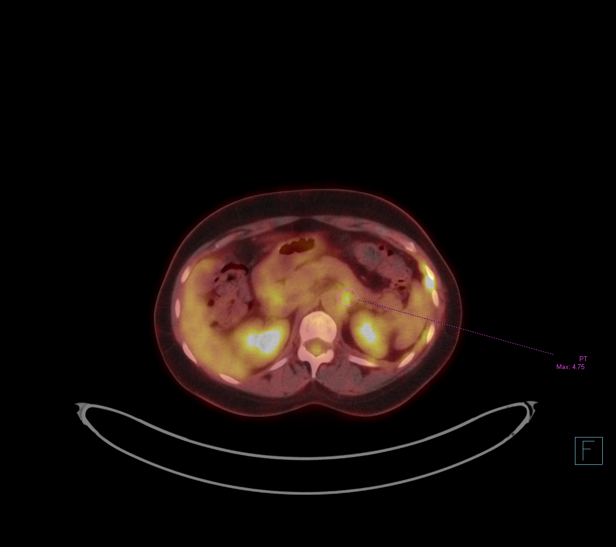
[im 10/11]
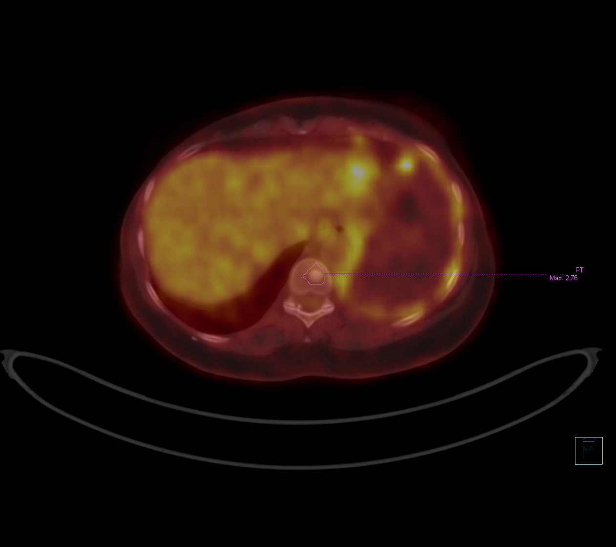
[im 11/11]
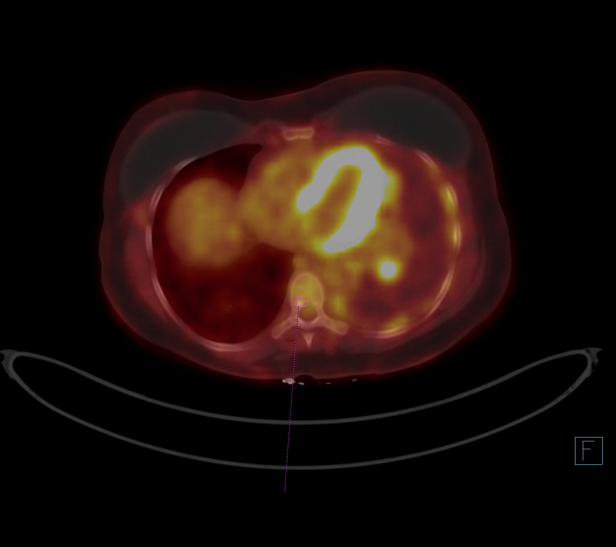

[11 of 11 positions shown; findings below may reference images not displayed]

FINDINGS: Mediastinal blood pool activity: SUV max

Liver activity: SUV max NA

NECK: Hypermetabolic tonsillar tissue in the area the lingual
tonsils maximum SUV 8.3 on the RIGHT and 6.3 on the LEFT. Mild
asymmetry given presence of base of the tongue is nonspecific but
mildly suspicious given other findings.

Incidental CT findings: none

CHEST: Diffuse pleural involvement with increased metabolic
activity.

(Image 84/4) 13 mm greatest thickness of pleural disease over the
LEFT hemidiaphragm with a maximum SUV of 7.4. Similar FDG uptake
within multinodular areas of pleural disease involving the dependent
more so than the medial, lateral and anterior chest in the upper
chest and circumferential in the lower chest extending into the
lowest aspects of the costodiaphragmatic recess.

Additional area seen along the LEFT mediastinal border (image 41/4)
maximum SUV 7.2 this area measuring approximately 2.8 x 1.3 cm.
Pericardial nodularity on image 56/4 9 mm also with increased
metabolic activity showing a maximum SUV of 9.

Dense LEFT lower lobe consolidation with an area of focal increased
metabolic activity (image 67/4) metabolic activity involves an area
of low attenuation within consolidated lung that measures
approximately 16 mm with a maximum SUV of 7.9. No discrete
adenopathy in the chest. RIGHT chest is clear.

Small lymph node at the LEFT thoracic inlet (image 33/4) 7 mm with a
maximum SUV of 4.46.

Incidental CT findings: Post bilateral mastectomy and breast
reconstruction with RIGHT axillary dissection.

Dense LEFT lower lobe consolidation/volume loss in the setting of
malignant effusion. RIGHT chest is clear.

ABDOMEN/PELVIS: No solid organ metastases in the abdomen.

Area of increased metabolic activity along the medial aspect of the
abdominal aorta just below the LEFT diaphragmatic crus (image 90/4)
subtle thickening in this area measuring 7 mm adjacent to the LEFT
adrenal gland shows a maximum SUV of 4.8.

No additional signs of abdominal or pelvic involvement.

Incidental CT findings: Low-density lesion posterior RIGHT hemiliver
likely a small cyst or hemangioma not seen in 8191 but without
evidence of increased metabolic activity. No pericholecystic
stranding. Pancreas, spleen and adrenal glands are unremarkable.

Kidneys with smooth contours, no hydronephrosis. Normal appendix.
Colonic diverticulosis. Post hysterectomy.

SKELETON: Heterogeneous marrow uptake and new areas of focal bony
sclerosis since 8191 (image 77/4) T11 lesion measuring 6 mm maximum
SUV of 2.76. Other more sclerotic area in T9 posteriorly measuring
less than a cm with a maximum SUV of 2.9 subcentimeter foci in the
lumbar spine with similar FDG uptake in L2 and L3. Bony pelvis
without signs of focal sclerosis.

Incidental CT findings: none
IMPRESSION: Malignant LEFT pleural effusion with extensive areas of nodularity
and hypermetabolic activity involving the mediastinal border and
pericardium as well as the most inferior aspects of the
costodiaphragmatic recess.

Signs of nodal disease at the thoracic inlet on the LEFT.

Suspect extension of disease below the diaphragm along the LEFT
anterolateral aorta.

Nonspecific moderate to markedly increased metabolic activity about
the base of the tongue bilaterally. Consider direct visualization,
showing mildly asymmetric uptake favoring the RIGHT lingual tonsil.

Sclerotic foci without marked increased metabolic activity. These
areas are new since 8191 and suspicious for metastatic disease
perhaps treated and or quiescent. Heterogeneous marrow uptake seen
elsewhere is generalized and nonspecific, correlate with any history
of marrow stimulation or longstanding anemia. Consider spinal MRI
as warranted for further evaluation.

## 2021-09-16 MED ORDER — FLUDEOXYGLUCOSE F - 18 (FDG) INJECTION
7.5000 | Freq: Once | INTRAVENOUS | Status: AC
  Administered 2021-09-16: 7.35 via INTRAVENOUS

## 2021-09-18 ENCOUNTER — Encounter: Payer: Self-pay | Admitting: Hematology and Oncology

## 2021-09-18 ENCOUNTER — Other Ambulatory Visit: Payer: Self-pay | Admitting: Emergency Medicine

## 2021-09-18 ENCOUNTER — Inpatient Hospital Stay: Payer: Federal, State, Local not specified - PPO | Admitting: Emergency Medicine

## 2021-09-18 ENCOUNTER — Inpatient Hospital Stay
Payer: Federal, State, Local not specified - PPO | Attending: Hematology and Oncology | Admitting: Hematology and Oncology

## 2021-09-18 ENCOUNTER — Encounter: Payer: Self-pay | Admitting: *Deleted

## 2021-09-18 ENCOUNTER — Other Ambulatory Visit: Payer: Self-pay

## 2021-09-18 VITALS — BP 153/112 | HR 109 | Temp 97.5°F | Resp 18 | Ht 62.0 in | Wt 146.7 lb

## 2021-09-18 DIAGNOSIS — C50211 Malignant neoplasm of upper-inner quadrant of right female breast: Secondary | ICD-10-CM | POA: Diagnosis not present

## 2021-09-18 DIAGNOSIS — Z170 Estrogen receptor positive status [ER+]: Secondary | ICD-10-CM | POA: Diagnosis not present

## 2021-09-18 DIAGNOSIS — Z5111 Encounter for antineoplastic chemotherapy: Secondary | ICD-10-CM | POA: Insufficient documentation

## 2021-09-18 DIAGNOSIS — Z9013 Acquired absence of bilateral breasts and nipples: Secondary | ICD-10-CM | POA: Insufficient documentation

## 2021-09-18 DIAGNOSIS — J90 Pleural effusion, not elsewhere classified: Secondary | ICD-10-CM

## 2021-09-18 MED ORDER — LETROZOLE 2.5 MG PO TABS: 2.5000 mg | ORAL_TABLET | Freq: Every day | ORAL | 3 refills | Status: DC

## 2021-09-18 NOTE — Progress Notes (Signed)
[facility] ?CONSULT NOTE ? ?Patient Care Team: ?[name], NP as PCP - General (Family Medicine) ?Servando Salina, MD as Consulting Physician (Obstetrics and Gynecology) ?[name], MD as Consulting Physician (Hematology and Oncology) ? ?CHIEF COMPLAINTS/PURPOSE OF CONSULTATION:  ?Newly diagnosed breast cancer ? ?HISTORY OF PRESENTING ILLNESS:  ?Sherry Barry 51 y.o. female is here because of recent diagnosis of recurrent breast cancer. ? ?I reviewed her records extensively and collaborated the history with the patient. ? ?SUMMARY OF ONCOLOGIC HISTORY: ?Oncology History  ?Malignant neoplasm of upper-inner quadrant of right breast in female, estrogen receptor positive ([place])  ?07/13/2012 Clinical Stage  ? Stage IA: T1c N0 ?  ?07/14/2012 Definitive Surgery  ? Bilateral mastectomy/right SLNB: RIGHT invasive ductal carcinoma, grade 3, ER+, PR +, Her2/neu positive (ratio 3.02), Ki67 48%. DCIS. 1/4 LN positive for malignancy. LEFT: benign ?  ?07/14/2012 Pathologic Stage  ? Stage IIB: T2 N1a M0 ?  ?07/14/2012 Cancer Staging  ? Staging form: Breast, AJCC 7th Edition ?- Pathologic stage from 07/14/2012: Stage IV ([place], NX, M1) - Signed by [name], MD on 08/28/2021 ?Specimen type: Core Needle Biopsy ?Histopathologic type: 9931 ?Laterality: Right ? ?  ?08/17/2012 - 08/30/2013 Chemotherapy  ? Adjuvant carboplatin, docetaxel, and trastuzumab x 6 cycles (completed 11/30/2012) followed by maintenance trastuzumab to total one year ?  ?11/2012 Procedure  ? Comp Cancer Gene panel ([facility]) negative for deleterious mutations ?  ? - 02/2013 Radiation Therapy  ? Adjuvant RT to right breast ?  ?02/2013 -  Anti-estrogen oral therapy  ? Tamoxifen 20 mg daily ?  ?09/24/2013 Surgery  ? Bilateral breast reconstruction with latissmus flap and expander placement ?  ?12/12/2013 Surgery  ? Implant placement ?  ? Relapse/Recurrence  ? Left sided malignant pleural effusion.  Tumor cells are positive for GATA3 and ER,  negative for TTF-1 consistent with recurrent breast carcinoma ?Prognostic showed ER 90% positive strong staining, PR 80% positive strong staining, HER2 negative. ? ?  ?09/16/2021 Imaging  ? PET scan showed malignant left pleural effusion with extensive areas of nodularity and hypermetabolic activity involving the mediastinal border and pericardium as well as the most inferior aspects of the costodiaphragmatic recess.  Signs of nodal disease at the thoracic inlet on the left suspect extension of disease below the diaphragm along the left anterolateral aorta.  Nonspecific moderate to markedly increased metabolic activity about the base of the tongue bilaterally.  Consider direct visualization showing mildly asymmetric uptake favoring the right lingual tonsil.  Sclerotic foci without marked increased metabolic activity suspicious for metastatic disease perhaps treated or questions.  Heterogeneous marrow uptake seen elsewhere generalized and nonspecific.  Consider spinal MRI is warranted ? ?MRI brain without any evidence of intracranial metastatic disease. ?  ? ?She is here for follow-up after PET/CT and MRI brain imaging with her husband.  She complains of some neck pain in the left radiating down her neck.  Besides that she has not noticed any worsening shortness of breath or chest pain or cough.  She is very anxious understandably because of the situation.  She denies any changes in bowel habits or urinary habits.  No new neurological complaints. ? ?Rest of the pertinent 10 point ROS reviewed and negative. ? ?MEDICAL HISTORY:  ?Past Medical History:  ?Diagnosis Date  ? Allergy   ? Breast cancer ([place])   ? Breast cancer ([place])   ? right  ? History of chemotherapy   ? doxetaxel/carboplatin/trastuzumab  ? History of migraine   ? last one  about a week ago  ? Hx of radiation therapy 01/01/13- 02/15/13  ? r chest wall, r supraclav/axilla 5040 cGy/28 sessions, scar boost 1000 cGy/5 sessions  ? Hypertension   ? no meds,   urgent  care on [place]  ? Migraine   ? Migraine   ? ? ?SURGICAL HISTORY: ?Past Surgical History:  ?Procedure Laterality Date  ? ABDOMINAL HYSTERECTOMY    ? no salpingo-oophorectomy 2009  ? BREAST RECONSTRUCTION WITH PLACEMENT OF TISSUE EXPANDER AND FLEX HD (ACELLULAR HYDRATED DERMIS) Left 09/24/2013  ? IR THORACENTESIS ASP PLEURAL SPACE W/IMG GUIDE  08/11/2021  ? LATISSIMUS FLAP TO BREAST Right 09/24/2013  ? Procedure: RIGHT LATISSMUS MYOCUTAEIOUS MUSCLE FLAP AND PLACEMENT OF TISSUE [name];  Surgeon: Theodoro Kos, DO;  Location: [facility];  Service: Plastics;  Laterality: Right;  ? LIPOSUCTION WITH LIPOFILLING Bilateral 12/12/2013  ? Procedure: LIPOSUCTION WITH LIPOFILLING;  Surgeon: Theodoro Kos, DO;  Location: [facility];  Service: Plastics;  Laterality: Bilateral;  ? PORT-A-CATH REMOVAL Left 12/12/2013  ? Procedure: REMOVAL PORT-A-CATH;  Surgeon: Theodoro Kos, DO;  Location: [facility];  Service: Plastics;  Laterality: Left;  ? PORTACATH PLACEMENT  07/14/2012  ? Procedure: INSERTION PORT-A-CATH;  Surgeon: Joyice Faster. Cornett, MD;  Location: [facility];  Service: General;  Laterality: Left;  ? RECONSTRUCTION BREAST W/ LATISSIMUS DORSI FLAP Right 09/24/2013  ? "& tissue expander placement"  ? REMOVAL OF BILATERAL TISSUE EXPANDERS WITH PLACEMENT OF BILATERAL BREAST IMPLANTS Bilateral 12/12/2013  ? Procedure: REMOVAL OF BILATERAL TISSUE EXPANDERS WITH PLACEMENT OF BILATERAL BREAST IMPLANTS/BILATERAL CAPSULECTOMIES WITH  LIPOFILLING FAT GRAFTING;  Surgeon: Theodoro Kos, DO;  Location: [facility];  Service: Plastics;  Laterality: Bilateral;  ? SIMPLE MASTECTOMY WITH AXILLARY SENTINEL NODE BIOPSY  07/14/2012  ? Procedure: SIMPLE MASTECTOMY WITH AXILLARY SENTINEL NODE BIOPSY;  Surgeon: Joyice Faster. Cornett, MD;  Location: [facility];  Service: General;  Laterality: Right;  Bilateral simple mastectomy with port and right sebtibel lymph node mapping  ? SIMPLE MASTECTOMY WITH AXILLARY SENTINEL NODE BIOPSY   07/14/2012  ? Procedure: SIMPLE MASTECTOMY;  Surgeon: Joyice Faster. Cornett, MD;  Location: [facility];  Service: General;  Laterality: Left;  ? TISSUE EXPANDER PLACEMENT Left 09/24/2013  ? Procedure: PLACEMENT OF TISSUE EXPANDER AND FLEX HD TO LEFT BREAST;  Surgeon: Theodoro Kos, DO;  Location: [facility];  Service: Plastics;  Laterality: Left;  ? ? ?SOCIAL HISTORY: ?Social History  ? ?Socioeconomic History  ? Marital status: Married  ?  Spouse name: Not on file  ? Number of children: 2  ? Years of education: Not on file  ? Highest education level: Not on file  ?Occupational History  ?  Employer: [employer]  ?Tobacco Use  ? Smoking status: Never  ? Smokeless tobacco: Never  ?Substance and Sexual Activity  ? Alcohol use: Yes  ?  Comment: occasional  ? Drug use: No  ? Sexual activity: Yes  ?  Birth control/protection: Surgical  ?Other Topics Concern  ? Not on file  ?Social History Narrative  ? Not on file  ? ?Social Determinants of Health  ? ?Financial Resource Strain: Not on file  ?Food Insecurity: Not on file  ?Transportation Needs: Not on file  ?Physical Activity: Not on file  ?Stress: Not on file  ?Social Connections: Not on file  ?Intimate Partner Violence: Not on file  ? ? ?FAMILY HISTORY: ?Family History  ?Problem Relation Age of Onset  ? Hypertension Mother   ? Alcohol abuse Mother   ?  Heart disease Maternal Grandmother   ? Stroke Maternal Grandfather   ? Colon cancer Maternal Aunt 19  ?     alive at 21  ? Brain cancer Maternal Uncle 37  ?     and lymphoma in early 37s; deceased  ? Brain cancer Maternal Uncle 60  ?     deceased  ? Pancreatic cancer Maternal Uncle 48  ?     alive at 19  ? Breast cancer Cousin 31  ?     mat 1st cousin once removed through mat GF ; deceased  ? Breast cancer Maternal Aunt   ?     great aunt through mat GF; dx at ? age  ? ? ?ALLERGIES:  is allergic to codeine, hydrocodone, latex, lisinopril, and tomato. ? ?MEDICATIONS:  ?Current Outpatient Medications  ?Medication Sig Dispense Refill  ?  letrozole (FEMARA) 2.5 MG tablet Take 1 tablet (2.5 mg total) by mouth daily. 90 tablet 3  ? acetaminophen (TYLENOL) 500 MG tablet Take 1,000 mg by mouth every 6 (six) hours as needed.    ? amLODipine (NORVASC)

## 2021-09-18 NOTE — Addendum Note (Signed)
Addended by: Adaline Sill on: 09/18/2021 02:54 PM ? ? Modules accepted: Orders ? ?

## 2021-09-18 NOTE — Research (Signed)
[employer] 2021-05 - Specimen Collection Study to Evaluate Biomarkers in Subjects with Cancer  ? ?CONSENT ? ?Patient Sherry Barry was identified by MD Iruku as a potential candidate for the above listed study.  This Clinical Research Nurse met with Estreya Clay, [id number] on 09/18/21 in a manner and location that ensures patient privacy to discuss participation in the above listed research study.  Patient is Accompanied by spouse Harrell Gave .  Patient was previously provided with informed consent documents.  Patient confirmed they have read the informed consent documents. ? ?As outlined in the informed consent form, this Nurse and Berton Lan discussed the purpose of the research study, the investigational nature of the study, study procedures and requirements for study participation, potential risks and benefits of study participation, as well as alternatives to participation.  This study is not blinded or double-blinded. The patient understands participation is voluntary and they may withdraw from study participation at any time.  This study does not involve randomization.  This study does not involve an investigational drug or device. This study does not involve a placebo. Patient understands enrollment is pending full eligibility review.  ? ?Confidentiality and how the patient's information will be used as part of study participation were discussed.  Patient was informed there is reimbursement provided for their time and effort spent on trial participation.  The patient is encouraged to discuss research study participation with their insurance provider to determine what costs they may incur as part of study participation, including research related injury.   ? ?All questions were answered to patient's satisfaction.  The informed consent with embedded HIPAA language was reviewed page by page.  The patient's mental and emotional status is appropriate to provide informed consent,  and the patient verbalizes an understanding of study participation.  Patient has agreed to participate in the above listed research study and has voluntarily signed the informed consent version date 06/27/2020 (revised 07/13/2021) with embedded HIPAA language, version date 06/27/2020 (revised 07/13/2021)  on 09/18/21 at 2:25PM.  The patient was provided with a copy of the signed informed consent form with embedded HIPAA language for their reference.  No study specific procedures were obtained prior to the signing of the informed consent document.  Approximately 30 minutes were spent with the patient reviewing the informed consent documents.  Patient was not requested to complete a Release of Information form. ? ?Wells Guiles 'Learta Codding' Norell Brisbin, RN, BSN ?Clinical Research Nurse I ?09/18/21 ?2:34 PM ? ? ?

## 2021-09-18 NOTE — Progress Notes (Unsigned)
Sherry Cleveland, MD  Sherry Barry ?Ok  ? ?CT core L pleural mass  ?See PET 09/16/21 Im81 Se4 avoid aorta  ?Wile can do by [place] if preferred but need good cores for molecular studies etc  ? ?DDH   ?  ?   ?Previous Messages ?  ?----- Message -----  ?From: Sherry Barry  ?Sent: 09/18/2021   3:32 PM EDT  ?To: Ir Procedure Requests  ?Subject: US Biopsy                                      ? ? ? ?US Biopsy  ? ?US biopsy best accessed left pleural nodule. Discussed with Dr Jacalyn Lefevre and IR  ? ?Pet in computer  ? ?Dr. [name]  ?[phone]  ?

## 2021-09-18 NOTE — Research (Signed)
[employer] 2021-05 - Specimen Collection Study to Evaluate Biomarkers in Subjects with Cancer  ? ?This Nurse has reviewed this patient's inclusion and exclusion criteria as a second review and confirms Sherry Barry is eligible for study participation.  Patient may continue with enrollment. ? ?Vickii Penna, RN, BSN, CPN ?Clinical Research Nurse I ?[phone] ? ?09/18/2021 3:19 PM ? ?

## 2021-09-18 NOTE — Assessment & Plan Note (Signed)
This is a very pleasant 51 year old perimenopausal female patient with past medical history significant for right sided breast cancer, grade 3, triple positive at diagnosis status post bilateral mastectomy and right sentinel lymph node biopsy followed by adjuvant chemotherapy with carboplatin, docetaxel and trastuzumab for 6 cycles followed by maintenance trastuzumab for 1 year, adjuvant radiation, tamoxifen for 5 years now presents with recurrent breast cancer.  She initially presented to the hospital with chief complaint of pleuritic chest pain, cough and dyspnea and was found to have left-sided pleural effusion and a couple sclerotic lesions in the spine.  Cytology from the left-sided pleural effusion suggested metastatic adenocarcinoma, prognostics pending. ?She is clinically asymptomatic except for the intermittent left-sided pleuritic chest pain.  Physical examination unremarkable except for left pleural effusion ?PET/CT reviewed along with imaging.  We have discussed that there is evidence of malignant left pleural effusion with extensive areas of nodularity and hypermetabolic activity involving the mediastinal border and pericardium as well as inferior aspect of costodiaphragmatic recess.  Nodal disease at the thoracic inlet on the left, suspect extension of disease below the diaphragm along the left anterolateral aorta.  Metabolic activity about the base of tongue bilaterally.  Sclerotic foci without marked increased activity, some asymmetric uptake favoring the right lingual tonsil.  MRI brain negative. ?We have discussed that prognostics from pleural fluid suggest this is ER/PR strongly positive and HER2 negative. ?If this is indeed HER2 negative, she can proceed with CDK 4 6 inhibitors and AI with ovarian suppression given her age. ?I will start her on Zoladex ASAP and letrozole.  I was however inclined towards repeating another biopsy to confirm the HER2 status.  If she does have evidence of HER2  positivity, she may benefit from HER2 directed therapy.  Her prior tumor was HER2 amplified with a ratio of 3 ? ?She understands therapeutic options.  She understands that the intent of treatment which is palliative.  She is willing to try any treatment that might help her. ?Ordered CA 27-29 to be able to track the status of the disease. ?Return to clinic in 4 weeks.  ? ?She understands that this cannot be cured.  All her questions were answered to the best of my knowledge. ?

## 2021-09-18 NOTE — Research (Signed)
[employer] 2021-05 - Specimen Collection Study to Evaluate Biomarkers in Subjects with Cancer  ? ?ELIGIBILITY CHECK ? ?This Nurse has reviewed this patient's inclusion and exclusion criteria and confirmed Carsynn [place] is eligible for study participation.  Patient will continue with enrollment in the Recurrent Cancer Cohort. ? ?Confirmed the following with the patient verbally today: ?- Patient is able to provide her own informed consent ?- Patient understands and is willing and able to comply with all study procedures ?- Patient has not been/is not currently enrolled in another [facility] sample collection study or another cohort within this study. ? ?Reviewed exclusion criteria from protocol for all subjects as well as the recurrent cancer cohort- patient verbally denies all.  Reviewed medication and allergy lists with patient who confirmed they are correct.  Patient has not started taking treatment for recurrence yet. ? ?Eligibility confirmed by treating investigator, who also agrees that patient should proceed with enrollment. ? ?Patient returning on 09/22/2021 to have blood drawn at 2pm.  Order has been placed. ? ?Wells Guiles 'Learta Codding' Karsyn Rochin, RN, BSN ?Clinical Research Nurse I ?09/18/21 ?3:27 PM ? ?

## 2021-09-22 ENCOUNTER — Inpatient Hospital Stay: Payer: Federal, State, Local not specified - PPO

## 2021-09-22 ENCOUNTER — Encounter: Payer: Self-pay | Admitting: Emergency Medicine

## 2021-09-22 ENCOUNTER — Other Ambulatory Visit: Payer: Self-pay

## 2021-09-22 DIAGNOSIS — C50919 Malignant neoplasm of unspecified site of unspecified female breast: Secondary | ICD-10-CM

## 2021-09-22 DIAGNOSIS — C50211 Malignant neoplasm of upper-inner quadrant of right female breast: Secondary | ICD-10-CM | POA: Diagnosis not present

## 2021-09-22 DIAGNOSIS — J90 Pleural effusion, not elsewhere classified: Secondary | ICD-10-CM

## 2021-09-22 DIAGNOSIS — Z9013 Acquired absence of bilateral breasts and nipples: Secondary | ICD-10-CM | POA: Diagnosis not present

## 2021-09-22 DIAGNOSIS — Z170 Estrogen receptor positive status [ER+]: Secondary | ICD-10-CM

## 2021-09-22 DIAGNOSIS — Z5111 Encounter for antineoplastic chemotherapy: Secondary | ICD-10-CM | POA: Diagnosis not present

## 2021-09-22 LAB — CBC WITH DIFFERENTIAL/PLATELET
Abs Immature Granulocytes: 0.02 10*3/uL (ref 0.00–0.07)
Basophils Absolute: 0.1 10*3/uL (ref 0.0–0.1)
Basophils Relative: 1 %
Eosinophils Absolute: 0.2 10*3/uL (ref 0.0–0.5)
Eosinophils Relative: 2 %
HCT: 45.5 % (ref 36.0–46.0)
Hemoglobin: 15.8 g/dL — ABNORMAL HIGH (ref 12.0–15.0)
Immature Granulocytes: 0 %
Lymphocytes Relative: 24 %
Lymphs Abs: 1.6 10*3/uL (ref 0.7–4.0)
MCH: 29.1 pg (ref 26.0–34.0)
MCHC: 34.7 g/dL (ref 30.0–36.0)
MCV: 83.8 fL (ref 80.0–100.0)
Monocytes Absolute: 0.4 10*3/uL (ref 0.1–1.0)
Monocytes Relative: 6 %
Neutro Abs: 4.6 10*3/uL (ref 1.7–7.7)
Neutrophils Relative %: 67 %
Platelets: 309 10*3/uL (ref 150–400)
RBC: 5.43 MIL/uL — ABNORMAL HIGH (ref 3.87–5.11)
RDW: 12.5 % (ref 11.5–15.5)
WBC: 6.9 10*3/uL (ref 4.0–10.5)
nRBC: 0 % (ref 0.0–0.2)

## 2021-09-22 LAB — COMPREHENSIVE METABOLIC PANEL
ALT: 14 U/L (ref 0–44)
AST: 16 U/L (ref 15–41)
Albumin: 4.5 g/dL (ref 3.5–5.0)
Alkaline Phosphatase: 70 U/L (ref 38–126)
Anion gap: 10 (ref 5–15)
BUN: 7 mg/dL (ref 6–20)
CO2: 25 mmol/L (ref 22–32)
Calcium: 9.4 mg/dL (ref 8.9–10.3)
Chloride: 106 mmol/L (ref 98–111)
Creatinine, Ser: 0.66 mg/dL (ref 0.44–1.00)
GFR, Estimated: >60 mL/min (ref >60)
Glucose, Bld: 90 mg/dL (ref 70–99)
Potassium: 3.2 mmol/L — ABNORMAL LOW (ref 3.5–5.1)
Sodium: 141 mmol/L (ref 135–145)
Total Bilirubin: 0.4 mg/dL (ref 0.3–1.2)
Total Protein: 7.9 g/dL (ref 6.5–8.1)

## 2021-09-22 LAB — RESEARCH LABS

## 2021-09-22 NOTE — Research (Signed)
[employer] 2021-05 - Specimen Collection Study to Evaluate Biomarkers in Subjects with Cancer  ? ?GIFT CARD ? ?Patient's blood draw completed today.  Gift card given and release form signed by patient. ? ?Wells Guiles 'Learta Codding' Jamarkis Branam, RN, BSN ?Clinical Research Nurse I ?09/22/21 ?2:37 PM ? ?

## 2021-09-22 NOTE — Research (Signed)
[employer] 2021-05 - Specimen Collection Study to Evaluate Biomarkers in Subjects with Cancer  ? ?HISTORY ? ?This patient reports that medical and surgical history, as well as social determinants/history, in chart is correct.  This patient is not taking magnesium supplements.  The patient does not report family history of cancer in 1st or 2nd degree relatives. ? ?Wells Guiles 'Learta Codding' Carina Chaplin, RN, BSN ?Clinical Research Nurse I ?09/22/21 ?2:40 PM ? ? ?

## 2021-09-22 NOTE — Research (Signed)
[employer] 2021-05 - Specimen Collection Study to Evaluate Biomarkers in Subjects with Cancer  ? ?FINAL ELIGIBILITY CHECK ? ?Confirmed that nothing in patient's medical/surgical history has changed since last visit.  Reviewed meds and allergies and updated. Confirmed that patient has not started treatment yet (oral or injection) for cancer.  Second re-check of eligibility done by Vickii Penna RN who confirmed pt is still eligible for study.  Patient eligible for study and is having blood drawn today (09/22/21). ? ?Sherry Guiles 'Learta Codding' Luther Newhouse, RN, BSN ?Clinical Research Nurse I ?09/22/21 ?2:29 PM ? ?

## 2021-09-23 ENCOUNTER — Encounter: Payer: Self-pay | Admitting: Hematology and Oncology

## 2021-09-23 LAB — CANCER ANTIGEN 27.29: CA 27.29: 74.8 U/mL — ABNORMAL HIGH (ref 0.0–38.6)

## 2021-09-23 NOTE — Research (Signed)
[employer] 2021-05 - Specimen Collection Study to Evaluate Biomarkers in Subjects with Cancer  ? ?LATE ENTRY ? ?Addendum to earlier note stating "The patient does not report family history of cancer in 1st or 2nd degree relatives".  Patient did report family history of cancer in 2nd degree relatives, just not 1st degree.  Pt confirmed that the family history on file was correct. ? ?Wells Guiles 'Learta Codding' Mattison Golay, RN, BSN ?Clinical Research Nurse I ?09/23/21 ?2:27 PM ? ?

## 2021-09-24 ENCOUNTER — Other Ambulatory Visit: Payer: Self-pay

## 2021-09-24 ENCOUNTER — Inpatient Hospital Stay: Payer: Federal, State, Local not specified - PPO

## 2021-09-24 VITALS — BP 146/115 | HR 100 | Temp 98.5°F | Resp 18

## 2021-09-24 DIAGNOSIS — Z5111 Encounter for antineoplastic chemotherapy: Secondary | ICD-10-CM | POA: Diagnosis not present

## 2021-09-24 DIAGNOSIS — C50211 Malignant neoplasm of upper-inner quadrant of right female breast: Secondary | ICD-10-CM | POA: Diagnosis not present

## 2021-09-24 DIAGNOSIS — Z9013 Acquired absence of bilateral breasts and nipples: Secondary | ICD-10-CM | POA: Diagnosis not present

## 2021-09-24 DIAGNOSIS — Z170 Estrogen receptor positive status [ER+]: Secondary | ICD-10-CM | POA: Diagnosis not present

## 2021-09-24 LAB — ACID FAST CULTURE WITH REFLEXED SENSITIVITIES (MYCOBACTERIA): Acid Fast Culture: NEGATIVE

## 2021-09-24 MED ORDER — GOSERELIN ACETATE 3.6 MG ~~LOC~~ IMPL
3.6000 mg | DRUG_IMPLANT | Freq: Once | SUBCUTANEOUS | Status: AC
  Administered 2021-09-24: 3.6 mg via SUBCUTANEOUS
  Filled 2021-09-24: qty 3.6

## 2021-09-25 ENCOUNTER — Other Ambulatory Visit (HOSPITAL_COMMUNITY): Payer: Self-pay | Admitting: Physician Assistant

## 2021-09-26 ENCOUNTER — Other Ambulatory Visit: Payer: Self-pay | Admitting: Radiology

## 2021-09-28 ENCOUNTER — Other Ambulatory Visit: Payer: Self-pay

## 2021-09-28 ENCOUNTER — Encounter (HOSPITAL_COMMUNITY): Payer: Self-pay

## 2021-09-28 ENCOUNTER — Ambulatory Visit (HOSPITAL_COMMUNITY)
Admission: RE | Admit: 2021-09-28 | Discharge: 2021-09-28 | Disposition: A | Discharge status: Home / Self Care | Payer: Federal, State, Local not specified - PPO | Source: Ambulatory Visit | Attending: Hematology and Oncology | Admitting: Hematology and Oncology

## 2021-09-28 DIAGNOSIS — Z170 Estrogen receptor positive status [ER+]: Secondary | ICD-10-CM | POA: Diagnosis not present

## 2021-09-28 DIAGNOSIS — J90 Pleural effusion, not elsewhere classified: Secondary | ICD-10-CM | POA: Diagnosis not present

## 2021-09-28 DIAGNOSIS — C50919 Malignant neoplasm of unspecified site of unspecified female breast: Secondary | ICD-10-CM | POA: Diagnosis not present

## 2021-09-28 DIAGNOSIS — C50211 Malignant neoplasm of upper-inner quadrant of right female breast: Secondary | ICD-10-CM | POA: Insufficient documentation

## 2021-09-28 DIAGNOSIS — C782 Secondary malignant neoplasm of pleura: Secondary | ICD-10-CM | POA: Diagnosis not present

## 2021-09-28 DIAGNOSIS — R238 Other skin changes: Secondary | ICD-10-CM | POA: Diagnosis not present

## 2021-09-28 DIAGNOSIS — C7800 Secondary malignant neoplasm of unspecified lung: Secondary | ICD-10-CM | POA: Diagnosis not present

## 2021-09-28 LAB — CBC
HCT: 42.5 % (ref 36.0–46.0)
Hemoglobin: 14.8 g/dL (ref 12.0–15.0)
MCH: 29.5 pg (ref 26.0–34.0)
MCHC: 34.8 g/dL (ref 30.0–36.0)
MCV: 84.8 fL (ref 80.0–100.0)
Platelets: 251 10*3/uL (ref 150–400)
RBC: 5.01 MIL/uL (ref 3.87–5.11)
RDW: 12.3 % (ref 11.5–15.5)
WBC: 6.3 10*3/uL (ref 4.0–10.5)
nRBC: 0 % (ref 0.0–0.2)

## 2021-09-28 LAB — PROTIME-INR
INR: 1.1 (ref 0.8–1.2)
Prothrombin Time: 14.1 seconds (ref 11.4–15.2)

## 2021-09-28 MED ORDER — FENTANYL CITRATE (PF) 100 MCG/2ML IJ SOLN
INTRAMUSCULAR | Status: AC
  Filled 2021-09-28: qty 2

## 2021-09-28 MED ORDER — SODIUM CHLORIDE 0.9 % IV SOLN
INTRAVENOUS | Status: DC
Start: 2021-09-28 — End: 2021-09-29

## 2021-09-28 MED ORDER — MIDAZOLAM HCL 2 MG/2ML IJ SOLN
INTRAMUSCULAR | Status: AC | PRN
  Administered 2021-09-28: .5 mg via INTRAVENOUS
  Administered 2021-09-28: 1 mg via INTRAVENOUS

## 2021-09-28 MED ORDER — FENTANYL CITRATE (PF) 100 MCG/2ML IJ SOLN
INTRAMUSCULAR | Status: AC | PRN
  Administered 2021-09-28: 50 ug via INTRAVENOUS
  Administered 2021-09-28: 25 ug via INTRAVENOUS

## 2021-09-28 MED ORDER — LIDOCAINE HCL 1 % IJ SOLN
INTRAMUSCULAR | Status: AC
  Filled 2021-09-28: qty 10

## 2021-09-28 MED ORDER — MIDAZOLAM HCL 2 MG/2ML IJ SOLN
INTRAMUSCULAR | Status: AC
  Filled 2021-09-28: qty 2

## 2021-09-28 NOTE — Procedures (Signed)
Interventional Radiology Procedure Note ? ?Procedure: Ultrasound and CT guided left pleural mass biopsy ? ?Findings: Please refer to procedural dictation for full description. 18 ga core x 5. ? ?Complications: None immediate ? ?Estimated Blood Loss: < 5 mL ? ?Recommendations: ?3 hour bedrest ?Follow up Pathology results ? ? ?Ruthann Cancer, MD ? ? ?

## 2021-09-28 NOTE — H&P (Signed)
? ?Chief Complaint: ?Patient was seen in consultation today for left lung mass biopsy at the request of [place] ? ?Referring Physician(s): ?Iruku,Praveena ? ?Supervising Physician: Ruthann Cancer ? ?Patient Status: [facility] - Out-pt ? ?History of Present Illness: ?Sherry Barry is a 51 y.o. female  ? ?Known Breast Ca and follows with Dr Chryl Heck ?Pt was being seen for pneumonia---  ? ?CT 2/27: IMPRESSION: ?1. No pulmonary embolus. ?2. Small left pleural effusion with associated partial left lower ?lobe collapse that appears to be heterogeneous. Limited evaluation ?due to timing of contrast. Underlying mass lesion or abscess not ?excluded. ?3. Interval development of an indeterminate sclerotic lesion along ?the superior endplate of the D97 vertebral body and posterior aspect ?of the T9 vertebral body. Findings could represent metastatic ?disease in a patient with history of malignancy. ? ?Left pleural effusion and thoracentesis 08/11/21:  +malignant cells adenocarcinoma ?Recurrent breast cancer ? ?09/16/21 PET:  IMPRESSION: ?Malignant LEFT pleural effusion with extensive areas of nodularity ?and hypermetabolic activity involving the mediastinal border and ?pericardium as well as the most inferior aspects of the ?costodiaphragmatic recess. ?Signs of nodal disease at the thoracic inlet on the LEFT. ?Suspect extension of disease below the diaphragm along the LEFT ?anterolateral aorta. ? ?Dr Chryl Heck note 09/18/21: We have discussed that prognostics from pleural fluid suggest this is ER/PR strongly positive and HER2 negative. ?If this is indeed HER2 negative, she can proceed with CDK 4 6 inhibitors and AI with ovarian suppression given her age. ?I will start her on Zoladex ASAP and letrozole.  I was however inclined towards repeating another biopsy to confirm the HER2 status.  If she does have evidence of HER2 positivity, she may benefit from HER2 directed therapy.  Her prior tumor was HER2 amplified with a ratio of  3 ?  ?She understands therapeutic options.  She understands that the intent of treatment which is palliative.  She is willing to try any treatment that might help her. ?  ?Scheduled now for left pleural based mass biopsy ? ?Past Medical History:  ?Diagnosis Date  ? Allergy   ? Breast cancer ([place])   ? Breast cancer ([place])   ? right  ? History of chemotherapy   ? doxetaxel/carboplatin/trastuzumab  ? History of migraine   ? last one about a week ago  ? Hx of radiation therapy 01/01/13- 02/15/13  ? r chest wall, r supraclav/axilla 5040 cGy/28 sessions, scar boost 1000 cGy/5 sessions  ? Hypertension   ? no meds,   urgent care on pomana  ? Migraine   ? Migraine   ? ? ?Past Surgical History:  ?Procedure Laterality Date  ? ABDOMINAL HYSTERECTOMY    ? no salpingo-oophorectomy 2009  ? BREAST RECONSTRUCTION WITH PLACEMENT OF TISSUE EXPANDER AND FLEX HD (ACELLULAR HYDRATED DERMIS) Left 09/24/2013  ? IR THORACENTESIS ASP PLEURAL SPACE W/IMG GUIDE  08/11/2021  ? LATISSIMUS FLAP TO BREAST Right 09/24/2013  ? Procedure: RIGHT LATISSMUS MYOCUTAEIOUS MUSCLE FLAP AND PLACEMENT OF TISSUE [name];  Surgeon: Theodoro Kos, DO;  Location: [place];  Service: Plastics;  Laterality: Right;  ? LIPOSUCTION WITH LIPOFILLING Bilateral 12/12/2013  ? Procedure: LIPOSUCTION WITH LIPOFILLING;  Surgeon: Theodoro Kos, DO;  Location: [place];  Service: Plastics;  Laterality: Bilateral;  ? PORT-A-CATH REMOVAL Left 12/12/2013  ? Procedure: REMOVAL PORT-A-CATH;  Surgeon: Theodoro Kos, DO;  Location: [place];  Service: Plastics;  Laterality: Left;  ? PORTACATH PLACEMENT  07/14/2012  ? Procedure: INSERTION PORT-A-CATH;  Surgeon: Joyice Faster. Cornett, MD;  Location[facility];  Service: General;  Laterality: Left;  ? RECONSTRUCTION BREAST W/ LATISSIMUS DORSI FLAP Right 09/24/2013  ? "& tissue expander placement"  ? REMOVAL OF BILATERAL TISSUE EXPANDERS WITH PLACEMENT OF BILATERAL BREAST IMPLANTS Bilateral 12/12/2013  ? Procedure: REMOVAL OF  BILATERAL TISSUE EXPANDERS WITH PLACEMENT OF BILATERAL BREAST IMPLANTS/BILATERAL CAPSULECTOMIES WITH  LIPOFILLING FAT GRAFTING;  Surgeon: Theodoro Kos, DO;  Location: [place];  Service: Plastics;  Laterality: Bilateral;  ? SIMPLE MASTECTOMY WITH AXILLARY SENTINEL NODE BIOPSY  07/14/2012  ? Procedure: SIMPLE MASTECTOMY WITH AXILLARY SENTINEL NODE BIOPSY;  Surgeon: Joyice Faster. Cornett, MD;  Location: [place];  Service: General;  Laterality: Right;  Bilateral simple mastectomy with port and right sebtibel lymph node mapping  ? SIMPLE MASTECTOMY WITH AXILLARY SENTINEL NODE BIOPSY  07/14/2012  ? Procedure: SIMPLE MASTECTOMY;  Surgeon: Joyice Faster. Cornett, MD;  Location: [place];  Service: General;  Laterality: Left;  ? TISSUE EXPANDER PLACEMENT Left 09/24/2013  ? Procedure: PLACEMENT OF TISSUE EXPANDER AND FLEX HD TO LEFT BREAST;  Surgeon: Theodoro Kos, DO;  Location: [place];  Service: Plastics;  Laterality: Left;  ? ? ?Allergies: ?Codeine, Hydrocodone, Latex, Lisinopril, and Tomato ? ?Medications: ?Prior to Admission medications   ?Medication Sig Start Date End Date Taking? Authorizing Provider  ?acetaminophen (TYLENOL) 500 MG tablet Take 1,000 mg by mouth every 6 (six) hours as needed.   Yes [provider]  ?amLODipine (NORVASC) 5 MG tablet Take 5 mg by mouth every morning. 07/24/21  Yes [provider]  ?letrozole (FEMARA) 2.5 MG tablet Take 1 tablet (2.5 mg total) by mouth daily. 09/18/21  Yes [name], MD  ?  ? ?Family History  ?Problem Relation Age of Onset  ? Hypertension Mother   ? Alcohol abuse Mother   ? Heart disease Maternal Grandmother   ? Stroke Maternal Grandfather   ? Colon cancer Maternal Aunt 58  ?     alive at 25  ? Brain cancer Maternal Uncle 80  ?     and lymphoma in early 58s; deceased  ? Brain cancer Maternal Uncle 60  ?     deceased  ? Pancreatic cancer Maternal Uncle 35  ?     alive at 15  ? Breast cancer Cousin 79  ?     mat 1st cousin once removed through mat GF ;  deceased  ? Breast cancer Maternal Aunt   ?     great aunt through mat GF; dx at ? age  ? ? ?Social History  ? ?Socioeconomic History  ? Marital status: Married  ?  Spouse name: Not on file  ? Number of children: 2  ? Years of education: Not on file  ? Highest education level: Not on file  ?Occupational History  ?  Employer: [employer]  ?Tobacco Use  ? Smoking status: Never  ? Smokeless tobacco: Never  ?Substance and Sexual Activity  ? Alcohol use: Yes  ?  Comment: occasional  ? Drug use: No  ? Sexual activity: Yes  ?  Birth control/protection: Surgical  ?Other Topics Concern  ? Not on file  ?Social History Narrative  ? Not on file  ? ?Social Determinants of Health  ? ?Financial Resource Strain: Not on file  ?Food Insecurity: Not on file  ?Transportation Needs: Not on file  ?Physical Activity: Not on file  ?Stress: Not on file  ?Social Connections: Not on file  ? ? ?Review of Systems: A 12 point ROS discussed  and pertinent positives are indicated in the HPI above.  All other systems are negative. ? ?Review of Systems  ?Constitutional:  Negative for activity change, fatigue and fever.  ?Respiratory:  Negative for cough and shortness of breath.   ?Cardiovascular:  Negative for chest pain.  ?Gastrointestinal:  Negative for abdominal pain.  ?Musculoskeletal:  Negative for back pain.  ?Neurological:  Negative for weakness.  ?Psychiatric/Behavioral:  Negative for behavioral problems and confusion.   ? ?Vital Signs: ?BP (!) 143/108   Pulse (!) 105   Temp 98.3 ?F (36.8 ?C) (Oral)   Resp 18   Ht 5' 2.5" (1.588 m)   Wt 148 lb (67.1 kg)   SpO2 97%   BMI 26.64 kg/m?  ? ?Physical Exam ?Vitals reviewed.  ?HENT:  ?   Mouth/Throat:  ?   Mouth: Mucous membranes are moist.  ?Cardiovascular:  ?   Rate and Rhythm: Normal rate and regular rhythm.  ?   Heart sounds: Normal heart sounds.  ?Pulmonary:  ?   Effort: Pulmonary effort is normal.  ?   Breath sounds: Normal breath sounds.  ?Abdominal:  ?   Palpations: Abdomen is soft.   ?   Tenderness: There is no abdominal tenderness.  ?Musculoskeletal:     ?   General: Normal range of motion.  ?Skin: ?   General: Skin is warm.  ?Neurological:  ?   Mental Status: She is alert and oriented

## 2021-09-28 NOTE — Progress Notes (Signed)
Lennette Bihari PA paged to see if pt can take home BP meds for 140/108. Pt took home BP meds with sip of water. ?

## 2021-09-29 LAB — CYTOLOGY - NON PAP

## 2021-10-01 ENCOUNTER — Telehealth: Payer: Self-pay

## 2021-10-01 ENCOUNTER — Ambulatory Visit: Payer: Federal, State, Local not specified - PPO | Admitting: Hematology and Oncology

## 2021-10-01 LAB — SURGICAL PATHOLOGY

## 2021-10-01 NOTE — Telephone Encounter (Signed)
Per MD, pt needs to be seen sooner than 5/11, as her pathology results came back. MD would like to see pt 4/21 at 1230; pt accepted and knows to show at 1215 for check in. Appt scheduled.  ?

## 2021-10-02 ENCOUNTER — Inpatient Hospital Stay: Payer: Federal, State, Local not specified - PPO | Admitting: Hematology and Oncology

## 2021-10-02 ENCOUNTER — Encounter: Payer: Self-pay | Admitting: Hematology and Oncology

## 2021-10-02 VITALS — BP 155/110 | HR 107 | Temp 97.5°F | Resp 18 | Wt 146.5 lb

## 2021-10-02 DIAGNOSIS — Z9013 Acquired absence of bilateral breasts and nipples: Secondary | ICD-10-CM | POA: Diagnosis not present

## 2021-10-02 DIAGNOSIS — Z5111 Encounter for antineoplastic chemotherapy: Secondary | ICD-10-CM | POA: Diagnosis not present

## 2021-10-02 DIAGNOSIS — C50211 Malignant neoplasm of upper-inner quadrant of right female breast: Secondary | ICD-10-CM

## 2021-10-02 DIAGNOSIS — Z170 Estrogen receptor positive status [ER+]: Secondary | ICD-10-CM

## 2021-10-02 NOTE — Progress Notes (Signed)
[place] CONSULT NOTE  Patient Care Team: [name], NP as PCP - General (Family Medicine) Servando Salina, MD as Consulting Physician (Obstetrics and Gynecology) [name], MD as Consulting Physician (Hematology and Oncology)  CHIEF COMPLAINTS/PURPOSE OF CONSULTATION:  Newly diagnosed breast cancer  HISTORY OF PRESENTING ILLNESS:  Sherry Barry 51 y.o. female is here because of recent diagnosis of recurrent breast cancer.  I reviewed her records extensively and collaborated the history with the patient.  SUMMARY OF ONCOLOGIC HISTORY: Oncology History  Malignant neoplasm of upper-inner quadrant of right breast in female, estrogen receptor positive ([place])  07/13/2012 Clinical Stage   Stage IA: T1c N0    07/14/2012 Definitive Surgery   Bilateral mastectomy/right SLNB: RIGHT invasive ductal carcinoma, grade 3, ER+, PR +, Her2/neu positive (ratio 3.02), Ki67 48%. DCIS. 1/4 LN positive for malignancy. LEFT: benign    07/14/2012 Pathologic Stage   Stage IIB: T2 N1a M0    07/14/2012 Cancer Staging   Staging form: Breast, AJCC 7th Edition - Pathologic stage from 07/14/2012: Stage IV (TX, NX, M1) - Signed by [name], MD on 08/28/2021 Specimen type: Core Needle Biopsy Histopathologic type: 9931 Laterality: Right    08/17/2012 - 08/30/2013 Chemotherapy   Adjuvant carboplatin, docetaxel, and trastuzumab x 6 cycles (completed 11/30/2012) followed by maintenance trastuzumab to total one year    11/2012 Procedure   Comp Cancer Gene panel ([facility]) negative for deleterious mutations     - 02/2013 Radiation Therapy   Adjuvant RT to right breast    02/2013 -  Anti-estrogen oral therapy   Tamoxifen 20 mg daily    09/24/2013 Surgery   Bilateral breast reconstruction with latissmus flap and expander placement    12/12/2013 Surgery   Implant placement     Relapse/Recurrence   Left sided malignant pleural effusion.  Tumor cells are positive  for GATA3 and ER, negative for TTF-1 consistent with recurrent breast carcinoma Prognostic showed ER 90% positive strong staining, PR 80% positive strong staining, HER2 negative.    09/16/2021 Imaging   PET scan showed malignant left pleural effusion with extensive areas of nodularity and hypermetabolic activity involving the mediastinal border and pericardium as well as the most inferior aspects of the costodiaphragmatic recess.  Signs of nodal disease at the thoracic inlet on the left suspect extension of disease below the diaphragm along the left anterolateral aorta.  Nonspecific moderate to markedly increased metabolic activity about the base of the tongue bilaterally.  Consider direct visualization showing mildly asymmetric uptake favoring the right lingual tonsil.  Sclerotic foci without marked increased metabolic activity suspicious for metastatic disease perhaps treated or questions.  Heterogeneous marrow uptake seen elsewhere generalized and nonspecific.  Consider spinal MRI is warranted  MRI brain without any evidence of intracranial metastatic disease.   10/09/2021 -  Chemotherapy   Patient is on Treatment Plan : BREAST DOCEtaxel + Trastuzumab + Pertuzumab (THP) q21d x 8 cycles / Trastuzumab + Pertuzumab q21d x 4 cycles        Patient is here for an appointment with her husband.  Since last visit she continues to have some shortness of breath on exertion but no worsening cough or shortness of breath.  She denies any other complaints for me today.  Pleural nodule biopsy was uneventful. Rest of the pertinent 10 point ROS reviewed and negative.  MEDICAL HISTORY:  Past Medical History:  Diagnosis Date   Allergy    Breast cancer ([place])    Breast cancer ([place])  right   History of chemotherapy    doxetaxel/carboplatin/trastuzumab   History of migraine    last one about a week ago   Hx of radiation therapy 01/01/13- 02/15/13   r chest wall, r supraclav/axilla 5040 cGy/28 sessions, scar  boost 1000 cGy/5 sessions   Hypertension    no meds,   urgent care on pomana   Migraine    Migraine     SURGICAL HISTORY: Past Surgical History:  Procedure Laterality Date   ABDOMINAL HYSTERECTOMY     no salpingo-oophorectomy 2009   BREAST RECONSTRUCTION WITH PLACEMENT OF TISSUE EXPANDER AND FLEX HD (ACELLULAR HYDRATED DERMIS) Left 09/24/2013   IR THORACENTESIS ASP PLEURAL SPACE W/IMG GUIDE  08/11/2021   LATISSIMUS FLAP TO BREAST Right 09/24/2013   Procedure: RIGHT LATISSMUS MYOCUTAEIOUS MUSCLE FLAP AND PLACEMENT OF TISSUE [name];  Surgeon: Theodoro Kos, DO;  Location: [place];  Service: Plastics;  Laterality: Right;   LIPOSUCTION WITH LIPOFILLING Bilateral 12/12/2013   Procedure: LIPOSUCTION WITH LIPOFILLING;  Surgeon: Theodoro Kos, DO;  Location: [place];  Service: Plastics;  Laterality: Bilateral;   PORT-A-CATH REMOVAL Left 12/12/2013   Procedure: REMOVAL PORT-A-CATH;  Surgeon: Theodoro Kos, DO;  Location: [place];  Service: Plastics;  Laterality: Left;   PORTACATH PLACEMENT  07/14/2012   Procedure: INSERTION PORT-A-CATH;  Surgeon: Joyice Faster. Cornett, MD;  Location: [place];  Service: General;  Laterality: Left;   RECONSTRUCTION BREAST W/ LATISSIMUS DORSI FLAP Right 09/24/2013   "& tissue expander placement"   REMOVAL OF BILATERAL TISSUE EXPANDERS WITH PLACEMENT OF BILATERAL BREAST IMPLANTS Bilateral 12/12/2013   Procedure: REMOVAL OF BILATERAL TISSUE EXPANDERS WITH PLACEMENT OF BILATERAL BREAST IMPLANTS/BILATERAL CAPSULECTOMIES WITH  LIPOFILLING FAT GRAFTING;  Surgeon: Theodoro Kos, DO;  Location: [place];  Service: Plastics;  Laterality: Bilateral;   SIMPLE MASTECTOMY WITH AXILLARY SENTINEL NODE BIOPSY  07/14/2012   Procedure: SIMPLE MASTECTOMY WITH AXILLARY SENTINEL NODE BIOPSY;  Surgeon: Joyice Faster. Cornett, MD;  Location: [place];  Service: General;  Laterality: Right;  Bilateral simple mastectomy with port and right sebtibel lymph node  mapping   SIMPLE MASTECTOMY WITH AXILLARY SENTINEL NODE BIOPSY  07/14/2012   Procedure: SIMPLE MASTECTOMY;  Surgeon: Joyice Faster. Cornett, MD;  Location: [place];  Service: General;  Laterality: Left;   TISSUE EXPANDER PLACEMENT Left 09/24/2013   Procedure: PLACEMENT OF TISSUE EXPANDER AND FLEX HD TO LEFT BREAST;  Surgeon: Theodoro Kos, DO;  Location: [place];  Service: Plastics;  Laterality: Left;    SOCIAL HISTORY: Social History   Socioeconomic History   Marital status: Married    Spouse name: Not on file   Number of children: 2   Years of education: Not on file   Highest education level: Not on file  Occupational History    Employer: [employer]  Tobacco Use   Smoking status: Never   Smokeless tobacco: Never  Substance and Sexual Activity   Alcohol use: Yes    Comment: occasional   Drug use: No   Sexual activity: Yes    Birth control/protection: Surgical  Other Topics Concern   Not on file  Social History Narrative   Not on file   Social Determinants of Health   Financial Resource Strain: Not on file  Food Insecurity: Not on file  Transportation Needs: Not on file  Physical Activity: Not on file  Stress: Not on file  Social Connections: Not on file  Intimate Partner Violence: Not on file    FAMILY HISTORY:  Family History  Problem Relation Age of Onset   Hypertension Mother    Alcohol abuse Mother    Heart disease Maternal Grandmother    Stroke Maternal Grandfather    Colon cancer Maternal Aunt 53       alive at 82   Brain cancer Maternal Uncle 82       and lymphoma in early 48s; deceased   Brain cancer Maternal Uncle 36       deceased   Pancreatic cancer Maternal Uncle 52       alive at 76   Breast cancer Cousin 85       mat 1st cousin once removed through mat GF ; deceased   Breast cancer Maternal Aunt        great aunt through mat GF; dx at ? age    34:  is allergic to codeine, hydrocodone, latex, lisinopril, and tomato.  MEDICATIONS:   Current Outpatient Medications  Medication Sig Dispense Refill   acetaminophen (TYLENOL) 500 MG tablet Take 1,000 mg by mouth every 6 (six) hours as needed.     amLODipine (NORVASC) 5 MG tablet Take 5 mg by mouth every morning.     letrozole (FEMARA) 2.5 MG tablet Take 1 tablet (2.5 mg total) by mouth daily. 90 tablet 3   No current facility-administered medications for this visit.    PHYSICAL EXAMINATION: ECOG PERFORMANCE STATUS: 0 - Asymptomatic  Vitals:   10/02/21 1229  BP: (!) 155/110  Pulse: (!) 107  Resp: 18  Temp: (!) 97.5 F (36.4 C)  SpO2: 97%   Filed Weights   10/02/21 1229  Weight: 146 lb 8 oz (66.5 kg)   Physical Exam Constitutional:      Appearance: Normal appearance.  Cardiovascular:     Rate and Rhythm: Normal rate and regular rhythm.     Pulses: Normal pulses.     Heart sounds: Normal heart sounds.  Pulmonary:     Effort: Pulmonary effort is normal.     Comments: Diminished air entry left lower lung Abdominal:     General: Abdomen is flat. Bowel sounds are normal.     Palpations: Abdomen is soft.  Musculoskeletal:        General: Normal range of motion.     Cervical back: Normal range of motion and neck supple. No rigidity.  Lymphadenopathy:     Cervical: No cervical adenopathy.  Skin:    General: Skin is warm and dry.  Neurological:     General: No focal deficit present.     Mental Status: She is alert.  Megan Megan   LABORATORY DATA:  I have reviewed the data as listed Lab Results  Component Value Date   WBC 6.3 09/28/2021   HGB 14.8 09/28/2021   HCT 42.5 09/28/2021   MCV 84.8 09/28/2021   PLT 251 09/28/2021   Lab Results  Component Value Date   NA 141 09/22/2021   K 3.2 (L) 09/22/2021   CL 106 09/22/2021   CO2 25 09/22/2021    RADIOGRAPHIC STUDIES: I have personally reviewed the radiological reports and agreed with the findings in the report.  ASSESSMENT AND PLAN:  Malignant neoplasm of upper-inner quadrant of right breast  in female, estrogen receptor positive ([place]) This is a very pleasant 51 year old perimenopausal female patient with past medical history significant for right sided breast cancer, grade 3, triple positive at diagnosis status post bilateral mastectomy and right sentinel lymph node biopsy followed by adjuvant chemotherapy with carboplatin, docetaxel and trastuzumab  for 6 cycles followed by maintenance trastuzumab for 1 year, adjuvant radiation, tamoxifen for 5 years now presents with recurrent breast cancer.  She initially presented to the hospital with chief complaint of pleuritic chest pain, cough and dyspnea and was found to have left-sided pleural effusion and a couple sclerotic lesions in the spine.  Cytology from the left-sided pleural effusion suggested metastatic adenocarcinoma, prognostics pending. She is clinically asymptomatic except for the intermittent left-sided pleuritic chest pain.  Physical examination unremarkable except for left pleural effusion PET/CT reviewed along with imaging.  We have discussed that there is evidence of malignant left pleural effusion with extensive areas of nodularity and hypermetabolic activity involving the mediastinal border and pericardium as well as inferior aspect of costodiaphragmatic recess.  Nodal disease at the thoracic inlet on the left, suspect extension of disease below the diaphragm along the left anterolateral aorta.  Metabolic activity about the base of tongue bilaterally.  Sclerotic foci without marked increased activity, some asymmetric uptake favoring the right lingual tonsil.  MRI brain negative. We have discussed that prognostics from pleural fluid suggest this is ER/PR strongly positive and HER2 negative.  However since her original biopsy was HER2 amplified, have requested FISH testing on the pleural sample as well as discussed with the pathologist who suggested a second biopsy.  We have hence proceeded with pleural nodule biopsy which confirmed  ER/PR and HER2 amplified breast cancer. Pleural fluid Her 2 FISH negative, discussed with Dr. Melina Copa.  She suggested we treat this as a HER2 amplified tumor since the solid tumor is indeed HER2 amplified and her original tumor is HER2 amplified.  She also mentioned that HER2 testing on fluid can be challenging and does not usually validated.  I have recommended proceeding with docetaxel Herceptin and Perjeta for first-line metastatic breast cancer.  We have discussed the known curative intent of treatment.  She had diarrhea and neuropathy the very first time she got chemotherapy.  She understands that some of the side effects from chemotherapy can be long-lasting and permanent.  We have once again discussed about common side effects which include fatigue, nausea, vomiting, diarrhea, neuropathy, cardiotoxicity, alopecia, increased risk of infections.  I have ordered a baseline echocardiogram as well as recommended port placement for chemotherapy administration.  We hope to get started as soon as next week. Once she is done with the chemotherapy component, will add antiestrogen therapy to Herceptin and Perjeta.  I spent 40 minutes in the care of this patient including history, review of pathology reports, formulating a plan of care and coordination.  Discussed with pathology team  All questions were answered. The patient knows to call the clinic with any problems, questions or concerns.    [name], MD 10/02/21

## 2021-10-02 NOTE — Progress Notes (Signed)
START ON PATHWAY REGIMEN - Breast ? ? ?  Cycle 1: A cycle is 21 days: ?    Pertuzumab  ?    Trastuzumab-xxxx  ?    Docetaxel  ?  Cycles 2 and beyond: A cycle is every 21 days: ?    Pertuzumab  ?    Trastuzumab-xxxx  ?    Docetaxel  ? ?**Always confirm dose/schedule in your pharmacy ordering system** ? ?Patient Characteristics: ?Distant Metastases or Locoregional Recurrent Disease - Unresected or Locally Advanced Unresectable Disease Progressing after Neoadjuvant and Local Therapies, HER2 Positive, ER Positive, Chemotherapy + HER2-Targeted Therapy, First Line ?Therapeutic Status: Distant Metastases ?HER2 Status: Positive (+) ?ER Status: Positive (+) ?PR Status: Positive (+) ?Line of Therapy: First Line ?Intent of Therapy: ?Non-Curative / Palliative Intent, Discussed with Patient ?

## 2021-10-02 NOTE — Progress Notes (Signed)
Pharmacist Chemotherapy Monitoring - Initial Assessment   ? ?Anticipated start date: 10/09/20  ? ?The following has been reviewed per standard work regarding the patient's treatment regimen: ?The patient's diagnosis, treatment plan and drug doses, and organ/hematologic function ?Lab orders and baseline tests specific to treatment regimen  ?The treatment plan start date, drug sequencing, and pre-medications ?Prior authorization status  ?Patient's documented medication list, including drug-drug interaction screen and prescriptions for anti-emetics and supportive care specific to the treatment regimen ?The drug concentrations, fluid compatibility, administration routes, and timing of the medications to be used ?The patient's access for treatment and lifetime cumulative dose history, if applicable  ?The patient's medication allergies and previous infusion related reactions, if applicable  ? ?Changes made to treatment plan:  ?treatment plan date ? ?Follow up needed:  ?Pending authorization for treatment  and prescriptions needed for anti-emetics ?ECHO pending ? ? ?Kennith Center, Pharm.D., CPP ?10/02/2021'@4'$ :31 PM ? ? ? ?

## 2021-10-02 NOTE — Assessment & Plan Note (Addendum)
This is a very pleasant 51 year old perimenopausal female patient with past medical history significant for right sided breast cancer, grade 3, triple positive at diagnosis status post bilateral mastectomy and right sentinel lymph node biopsy followed by adjuvant chemotherapy with carboplatin, docetaxel and trastuzumab for 6 cycles followed by maintenance trastuzumab for 1 year, adjuvant radiation, tamoxifen for 5 years now presents with recurrent breast cancer.  She initially presented to the hospital with chief complaint of pleuritic chest pain, cough and dyspnea and was found to have left-sided pleural effusion and a couple sclerotic lesions in the spine.  Cytology from the left-sided pleural effusion suggested metastatic adenocarcinoma, prognostics pending. ?She is clinically asymptomatic except for the intermittent left-sided pleuritic chest pain.  Physical examination unremarkable except for left pleural effusion ?PET/CT reviewed along with imaging.  We have discussed that there is evidence of malignant left pleural effusion with extensive areas of nodularity and hypermetabolic activity involving the mediastinal border and pericardium as well as inferior aspect of costodiaphragmatic recess.  Nodal disease at the thoracic inlet on the left, suspect extension of disease below the diaphragm along the left anterolateral aorta.  Metabolic activity about the base of tongue bilaterally.  Sclerotic foci without marked increased activity, some asymmetric uptake favoring the right lingual tonsil.  MRI brain negative. ?We have discussed that prognostics from pleural fluid suggest this is ER/PR strongly positive and HER2 negative. ? ?However since her original biopsy was HER2 amplified, have requested FISH testing on the pleural sample as well as discussed with the pathologist who suggested a second biopsy.  We have hence proceeded with pleural nodule biopsy which confirmed ER/PR and HER2 amplified breast cancer. Pleural  fluid Her 2 FISH negative, discussed with Dr. Melina Copa.  She suggested we treat this as a HER2 amplified tumor since the solid tumor is indeed HER2 amplified and her original tumor is HER2 amplified.  She also mentioned that HER2 testing on fluid can be challenging and does not usually validated. ? ?I have recommended proceeding with docetaxel Herceptin and Perjeta for first-line metastatic breast cancer.  We have discussed the known curative intent of treatment.  She had diarrhea and neuropathy the very first time she got chemotherapy.  She understands that some of the side effects from chemotherapy can be long-lasting and permanent.  We have once again discussed about common side effects which include fatigue, nausea, vomiting, diarrhea, neuropathy, cardiotoxicity, alopecia, increased risk of infections.  I have ordered a baseline echocardiogram as well as recommended port placement for chemotherapy administration.  We hope to get started as soon as next week. ?Once she is done with the chemotherapy component, will add antiestrogen therapy to Herceptin and Perjeta. ?

## 2021-10-05 ENCOUNTER — Telehealth: Payer: Self-pay

## 2021-10-05 NOTE — Telephone Encounter (Signed)
Pt called and asks if she can use cream on port and asks why she does not have Rx for it. Port to be placed Wednesday. Advised pt cream can not be used for 2 weeks d/t loosening incision. Pt verbalized thanks and understanding.  ?

## 2021-10-06 ENCOUNTER — Other Ambulatory Visit: Payer: Self-pay | Admitting: Internal Medicine

## 2021-10-06 ENCOUNTER — Other Ambulatory Visit: Payer: Self-pay | Admitting: Radiology

## 2021-10-06 ENCOUNTER — Other Ambulatory Visit (HOSPITAL_COMMUNITY): Payer: Self-pay | Admitting: Physician Assistant

## 2021-10-07 ENCOUNTER — Encounter (HOSPITAL_COMMUNITY): Payer: Self-pay

## 2021-10-07 ENCOUNTER — Ambulatory Visit (HOSPITAL_COMMUNITY)
Admission: RE | Admit: 2021-10-07 | Discharge: 2021-10-07 | Disposition: A | Discharge status: Home / Self Care | Payer: Federal, State, Local not specified - PPO | Source: Ambulatory Visit | Attending: Hematology and Oncology | Admitting: Hematology and Oncology

## 2021-10-07 ENCOUNTER — Other Ambulatory Visit: Payer: Self-pay

## 2021-10-07 ENCOUNTER — Ambulatory Visit (HOSPITAL_COMMUNITY)
Admission: RE | Admit: 2021-10-07 | Discharge: 2021-10-07 | Disposition: A | Discharge status: Home / Self Care | Payer: Federal, State, Local not specified - PPO | Source: Ambulatory Visit | Attending: Student | Admitting: Student

## 2021-10-07 DIAGNOSIS — Z170 Estrogen receptor positive status [ER+]: Secondary | ICD-10-CM | POA: Insufficient documentation

## 2021-10-07 DIAGNOSIS — J90 Pleural effusion, not elsewhere classified: Secondary | ICD-10-CM | POA: Insufficient documentation

## 2021-10-07 DIAGNOSIS — C50211 Malignant neoplasm of upper-inner quadrant of right female breast: Secondary | ICD-10-CM | POA: Diagnosis not present

## 2021-10-07 DIAGNOSIS — I10 Essential (primary) hypertension: Secondary | ICD-10-CM | POA: Diagnosis not present

## 2021-10-07 DIAGNOSIS — C50919 Malignant neoplasm of unspecified site of unspecified female breast: Secondary | ICD-10-CM | POA: Diagnosis not present

## 2021-10-07 DIAGNOSIS — C7800 Secondary malignant neoplasm of unspecified lung: Secondary | ICD-10-CM | POA: Diagnosis not present

## 2021-10-07 DIAGNOSIS — Z452 Encounter for adjustment and management of vascular access device: Secondary | ICD-10-CM | POA: Diagnosis not present

## 2021-10-07 DIAGNOSIS — Z48813 Encounter for surgical aftercare following surgery on the respiratory system: Secondary | ICD-10-CM | POA: Diagnosis not present

## 2021-10-07 HISTORY — PX: IR IMAGING GUIDED PORT INSERTION: IMG5740

## 2021-10-07 HISTORY — PX: IR THORACENTESIS ASP PLEURAL SPACE W/IMG GUIDE: IMG5380

## 2021-10-07 LAB — PROTIME-INR
INR: 1 (ref 0.8–1.2)
Prothrombin Time: 13 seconds (ref 11.4–15.2)

## 2021-10-07 MED ORDER — MIDAZOLAM HCL 2 MG/2ML IJ SOLN
INTRAMUSCULAR | Status: AC | PRN
  Administered 2021-10-07: 1 mg via INTRAVENOUS

## 2021-10-07 MED ORDER — LIDOCAINE-EPINEPHRINE 1 %-1:100000 IJ SOLN
INTRAMUSCULAR | Status: AC | PRN
  Administered 2021-10-07: 10 mL via INTRADERMAL

## 2021-10-07 MED ORDER — FENTANYL CITRATE (PF) 100 MCG/2ML IJ SOLN
INTRAMUSCULAR | Status: AC | PRN
Start: 2021-10-07 — End: 2021-10-07
  Administered 2021-10-07: 50 ug via INTRAVENOUS

## 2021-10-07 MED ORDER — MIDAZOLAM HCL 2 MG/2ML IJ SOLN
INTRAMUSCULAR | Status: AC
  Filled 2021-10-07: qty 4

## 2021-10-07 MED ORDER — LIDOCAINE-EPINEPHRINE 1 %-1:100000 IJ SOLN
INTRAMUSCULAR | Status: AC
  Filled 2021-10-07: qty 1

## 2021-10-07 MED ORDER — HEPARIN SOD (PORK) LOCK FLUSH 100 UNIT/ML IV SOLN
INTRAVENOUS | Status: AC
  Filled 2021-10-07: qty 5

## 2021-10-07 MED ORDER — HYDRALAZINE HCL 20 MG/ML IJ SOLN
INTRAMUSCULAR | Status: AC
  Filled 2021-10-07: qty 1

## 2021-10-07 MED ORDER — LIDOCAINE HCL 1 % IJ SOLN
INTRAMUSCULAR | Status: AC
  Filled 2021-10-07: qty 20

## 2021-10-07 MED ORDER — FENTANYL CITRATE (PF) 100 MCG/2ML IJ SOLN
INTRAMUSCULAR | Status: AC
  Filled 2021-10-07: qty 2

## 2021-10-07 MED ORDER — HYDRALAZINE HCL 20 MG/ML IJ SOLN
INTRAMUSCULAR | Status: AC | PRN
  Administered 2021-10-07: 10 mg via INTRAVENOUS

## 2021-10-07 MED ORDER — SODIUM CHLORIDE 0.9 % IV SOLN: INTRAVENOUS | Status: DC

## 2021-10-07 MED ORDER — MIDAZOLAM HCL 2 MG/2ML IJ SOLN: INTRAMUSCULAR | Status: AC | PRN

## 2021-10-07 MED ORDER — FENTANYL CITRATE (PF) 100 MCG/2ML IJ SOLN
INTRAMUSCULAR | Status: AC | PRN
  Administered 2021-10-07: 50 ug via INTRAVENOUS

## 2021-10-07 MED ORDER — MIDAZOLAM HCL 2 MG/2ML IJ SOLN
INTRAMUSCULAR | Status: AC | PRN
  Administered 2021-10-07: .5 mg via INTRAVENOUS

## 2021-10-07 NOTE — Discharge Instructions (Signed)
Please call Interventional Radiology clinic [phone] with any questions or concerns. ? ?You may remove your dressing and shower tomorrow. ? ?DO NOT use EMLA cream on your port site for 2 weeks as this cream will remove surgical glue on your incision. ? ? ? ?

## 2021-10-07 NOTE — Sedation Documentation (Signed)
Patient transported to Diagnostic X-Ray via stretcher in NAD. ?

## 2021-10-07 NOTE — H&P (Signed)
? ?Chief Complaint: ?Patient was seen in consultation today for port-a-catheter placement ? ?Referring Physician(s): ?Iruku,Praveena ? ?Supervising Physician: Sandi Mariscal ? ?Patient Status: [place] ? ?History of Present Illness: ?Sherry Barry is a 51 y.o. female with a medical history significant for HTN and breast cancer, initially diagnosed in 2014. She is s/p bilateral mastectomies with chemotherapy and radiation therapy. In February 2023 she developed a pleural effusion and underwent a thoracentesis in IR 08/11/21. Cytology was positive for malignancy. Imaging at that time was also positive for an underlying lung mass and on 09/28/21 IR performed a lung mass biopsy which confirmed adenocarcinoma consistent with breast primary.  ? ?Her oncology team is preparing her to begin chemotherapy. Interventional Radiology has been asked to evaluate this patient for an image-guided port-a-catheter placement to facilitate her treatment goals. She has a history of prior left-sided port-a-catheter - this was placed and subsequently removed by Surgery.  ? ?Past Medical History:  ?Diagnosis Date  ? Allergy   ? Breast cancer ([facility])   ? Breast cancer ([facility])   ? right  ? History of chemotherapy   ? doxetaxel/carboplatin/trastuzumab  ? History of migraine   ? last one about a week ago  ? Hx of radiation therapy 01/01/13- 02/15/13  ? r chest wall, r supraclav/axilla 5040 cGy/28 sessions, scar boost 1000 cGy/5 sessions  ? Hypertension   ? no meds,   urgent care on [place]  ? Migraine   ? Migraine   ? ? ?Past Surgical History:  ?Procedure Laterality Date  ? ABDOMINAL HYSTERECTOMY    ? no salpingo-oophorectomy 2009  ? BREAST RECONSTRUCTION WITH PLACEMENT OF TISSUE EXPANDER AND FLEX HD (ACELLULAR HYDRATED DERMIS) Left 09/24/2013  ? IR THORACENTESIS ASP PLEURAL SPACE W/IMG GUIDE  08/11/2021  ? LATISSIMUS FLAP TO BREAST Right 09/24/2013  ? Procedure: RIGHT LATISSMUS MYOCUTAEIOUS MUSCLE FLAP AND PLACEMENT OF TISSUE [name];   Surgeon: Theodoro Kos, DO;  Location: [facility];  Service: Plastics;  Laterality: Right;  ? LIPOSUCTION WITH LIPOFILLING Bilateral 12/12/2013  ? Procedure: LIPOSUCTION WITH LIPOFILLING;  Surgeon: Theodoro Kos, DO;  Location: [facility];  Service: Plastics;  Laterality: Bilateral;  ? PORT-A-CATH REMOVAL Left 12/12/2013  ? Procedure: REMOVAL PORT-A-CATH;  Surgeon: Theodoro Kos, DO;  Location: [facility];  Service: Plastics;  Laterality: Left;  ? PORTACATH PLACEMENT  07/14/2012  ? Procedure: INSERTION PORT-A-CATH;  Surgeon: Joyice Faster. Cornett, MD;  Location: [facility];  Service: General;  Laterality: Left;  ? RECONSTRUCTION BREAST W/ LATISSIMUS DORSI FLAP Right 09/24/2013  ? "& tissue expander placement"  ? REMOVAL OF BILATERAL TISSUE EXPANDERS WITH PLACEMENT OF BILATERAL BREAST IMPLANTS Bilateral 12/12/2013  ? Procedure: REMOVAL OF BILATERAL TISSUE EXPANDERS WITH PLACEMENT OF BILATERAL BREAST IMPLANTS/BILATERAL CAPSULECTOMIES WITH  LIPOFILLING FAT GRAFTING;  Surgeon: Theodoro Kos, DO;  Location: [facility];  Service: Plastics;  Laterality: Bilateral;  ? SIMPLE MASTECTOMY WITH AXILLARY SENTINEL NODE BIOPSY  07/14/2012  ? Procedure: SIMPLE MASTECTOMY WITH AXILLARY SENTINEL NODE BIOPSY;  Surgeon: Joyice Faster. Cornett, MD;  Location: [facility];  Service: General;  Laterality: Right;  Bilateral simple mastectomy with port and right sebtibel lymph node mapping  ? SIMPLE MASTECTOMY WITH AXILLARY SENTINEL NODE BIOPSY  07/14/2012  ? Procedure: SIMPLE MASTECTOMY;  Surgeon: Joyice Faster. Cornett, MD;  Location: [facility];  Service: General;  Laterality: Left;  ? TISSUE EXPANDER PLACEMENT Left 09/24/2013  ? Procedure: PLACEMENT OF TISSUE EXPANDER AND FLEX HD TO LEFT BREAST;  Surgeon: Theodoro Kos, DO;  Location:  [facility];  Service: Plastics;  Laterality: Left;  ? ? ?Allergies: ?Codeine, Hydrocodone, Latex, Lisinopril, and Tomato ? ?Medications: ?Prior to Admission medications   ?Medication Sig Start Date End Date  Taking? Authorizing Provider  ?acetaminophen (TYLENOL) 500 MG tablet Take 1,000 mg by mouth every 6 (six) hours as needed.   Yes [provider]  ?amLODipine (NORVASC) 5 MG tablet Take 5 mg by mouth every morning. 07/24/21  Yes [provider]  ?  ? ?Family History  ?Problem Relation Age of Onset  ? Hypertension Mother   ? Alcohol abuse Mother   ? Heart disease Maternal Grandmother   ? Stroke Maternal Grandfather   ? Colon cancer Maternal Aunt 4  ?     alive at 81  ? Brain cancer Maternal Uncle 34  ?     and lymphoma in early 74s; deceased  ? Brain cancer Maternal Uncle 60  ?     deceased  ? Pancreatic cancer Maternal Uncle 63  ?     alive at 35  ? Breast cancer Cousin 33  ?     mat 1st cousin once removed through mat GF ; deceased  ? Breast cancer Maternal Aunt   ?     great aunt through mat GF; dx at ? age  ? ? ?Social History  ? ?Socioeconomic History  ? Marital status: Married  ?  Spouse name: Not on file  ? Number of children: 2  ? Years of education: Not on file  ? Highest education level: Not on file  ?Occupational History  ?  Employer: [employer]  ?Tobacco Use  ? Smoking status: Never  ? Smokeless tobacco: Never  ?Substance and Sexual Activity  ? Alcohol use: Yes  ?  Comment: occasional  ? Drug use: No  ? Sexual activity: Yes  ?  Birth control/protection: Surgical  ?Other Topics Concern  ? Not on file  ?Social History Narrative  ? Not on file  ? ?Social Determinants of Health  ? ?Financial Resource Strain: Not on file  ?Food Insecurity: Not on file  ?Transportation Needs: Not on file  ?Physical Activity: Not on file  ?Stress: Not on file  ?Social Connections: Not on file  ? ? ?Review of Systems: A 12 point ROS discussed and pertinent positives are indicated in the HPI above.  All other systems are negative. ? ?Review of Systems  ?Constitutional:  Negative for appetite change and fatigue.  ?Respiratory:  Negative for cough and shortness of breath.   ?Cardiovascular:  Negative for chest  pain and leg swelling.  ?Gastrointestinal:  Negative for abdominal pain, diarrhea, nausea and vomiting.  ?Neurological:  Negative for dizziness and headaches.  ? ?Vital Signs: ?BP (!) 151/118 (BP Location: Left Arm)   Pulse (!) 102   Temp 98.2 ?F (36.8 ?C) (Oral)   Resp 18   SpO2 100%  ? ?Physical Exam ?Constitutional:   ?   General: She is not in acute distress. ?   Appearance: She is not ill-appearing.  ?HENT:  ?   Mouth/Throat:  ?   Mouth: Mucous membranes are moist.  ?   Pharynx: Oropharynx is clear.  ?Cardiovascular:  ?   Rate and Rhythm: Regular rhythm. Tachycardia present.  ?   Pulses: Normal pulses.  ?   Heart sounds: Normal heart sounds.  ?Pulmonary:  ?   Effort: Pulmonary effort is normal.  ?   Breath sounds: Normal breath sounds.  ?Abdominal:  ?   General: Bowel  sounds are normal.  ?   Palpations: Abdomen is soft.  ?   Tenderness: There is no abdominal tenderness.  ?Skin: ?   General: Skin is warm and dry.  ?Neurological:  ?   Mental Status: She is alert and oriented to person, place, and time.  ? ? ?Imaging: ?MR Brain W Wo Contrast ? ?Result Date: 09/08/2021 ?CLINICAL DATA:  Staging of metastatic breast cancer. EXAM: MRI HEAD WITHOUT AND WITH CONTRAST TECHNIQUE: Multiplanar, multiecho pulse sequences of the brain and surrounding structures were obtained without and with intravenous contrast. CONTRAST:  63m GADAVIST GADOBUTROL 1 MMOL/ML IV SOLN COMPARISON:  None. FINDINGS: Brain: There is no evidence of an acute infarct, intracranial hemorrhage, mass, midline shift, or extra-axial fluid collection. The ventricles and sulci are normal. There is a partially empty sella. No white matter disease is evident. No abnormal brain parenchymal or meningeal enhancement is seen to suggest metastatic disease. A tiny developmental venous anomaly is incidentally noted in the posterior left frontal lobe, of no clinical significance. Vascular: Major intracranial vascular flow voids are preserved. Skull and upper  cervical spine: Unremarkable bone marrow signal. Sinuses/Orbits: Unremarkable orbits. Paranasal sinuses and mastoid air cells are clear. Other: None. IMPRESSION: Largely unremarkable appearance of the brain. No evidence o

## 2021-10-07 NOTE — Procedures (Signed)
PROCEDURE SUMMARY: ? ?Successful US guided left thoracentesis. ?Yielded 1 L of clear yellow fluid. ?Pt tolerated procedure well. ?No immediate complications. ? ?Specimen not sent for labs. ?CXR ordered; no post-procedure pneumothorax identified.  ? ?EBL < 2 mL ? ?Theresa Duty, NP ?10/07/2021 ?3:27 PM ? ? ? ?

## 2021-10-07 NOTE — Procedures (Signed)
Pre Procedure Dx: Recurrent breast cancer ?Post Procedural Dx: Same ? ?Successful placement of right IJ approach port-a-cath with tip at the superior caval atrial junction. ?The catheter is ready for immediate use. ? ?Estimated Blood Loss: Minimal ? ?Complications: None immediate. ? ?Ronny Bacon, MD ?Pager #: [phone] ? ? ?. ?

## 2021-10-07 NOTE — Sedation Documentation (Signed)
Patient transported to Short Stay for recovery via stretcher in NAD. ?

## 2021-10-08 ENCOUNTER — Ambulatory Visit (HOSPITAL_COMMUNITY)
Admission: RE | Admit: 2021-10-08 | Discharge: 2021-10-08 | Disposition: A | Discharge status: Home / Self Care | Payer: Federal, State, Local not specified - PPO | Source: Ambulatory Visit | Attending: Hematology and Oncology | Admitting: Hematology and Oncology

## 2021-10-08 ENCOUNTER — Other Ambulatory Visit (HOSPITAL_COMMUNITY): Payer: Federal, State, Local not specified - PPO

## 2021-10-08 DIAGNOSIS — I10 Essential (primary) hypertension: Secondary | ICD-10-CM | POA: Insufficient documentation

## 2021-10-08 DIAGNOSIS — C50211 Malignant neoplasm of upper-inner quadrant of right female breast: Secondary | ICD-10-CM

## 2021-10-08 DIAGNOSIS — Z0189 Encounter for other specified special examinations: Secondary | ICD-10-CM

## 2021-10-08 DIAGNOSIS — Z170 Estrogen receptor positive status [ER+]: Secondary | ICD-10-CM | POA: Diagnosis not present

## 2021-10-08 LAB — ECHOCARDIOGRAM COMPLETE
AR max vel: 2.78 cm2
AV Peak grad: 3.5 mmHg
Ao pk vel: 0.94 m/s
Area-P 1/2: 4.54 cm2
Calc EF: 62.9 %
S' Lateral: 2.4 cm
Single Plane A2C EF: 59.6 %
Single Plane A4C EF: 63.9 %

## 2021-10-09 ENCOUNTER — Inpatient Hospital Stay: Payer: Federal, State, Local not specified - PPO

## 2021-10-09 ENCOUNTER — Other Ambulatory Visit: Payer: Self-pay

## 2021-10-09 VITALS — BP 139/89 | HR 98 | Temp 98.9°F | Resp 18

## 2021-10-09 DIAGNOSIS — C50211 Malignant neoplasm of upper-inner quadrant of right female breast: Secondary | ICD-10-CM

## 2021-10-09 DIAGNOSIS — Z170 Estrogen receptor positive status [ER+]: Secondary | ICD-10-CM | POA: Diagnosis not present

## 2021-10-09 DIAGNOSIS — Z5111 Encounter for antineoplastic chemotherapy: Secondary | ICD-10-CM | POA: Diagnosis not present

## 2021-10-09 DIAGNOSIS — C50919 Malignant neoplasm of unspecified site of unspecified female breast: Secondary | ICD-10-CM

## 2021-10-09 DIAGNOSIS — Z9013 Acquired absence of bilateral breasts and nipples: Secondary | ICD-10-CM | POA: Diagnosis not present

## 2021-10-09 LAB — CBC WITH DIFFERENTIAL/PLATELET
Abs Immature Granulocytes: 0.02 10*3/uL (ref 0.00–0.07)
Basophils Absolute: 0 10*3/uL (ref 0.0–0.1)
Basophils Relative: 1 %
Eosinophils Absolute: 0.2 10*3/uL (ref 0.0–0.5)
Eosinophils Relative: 2 %
HCT: 42.6 % (ref 36.0–46.0)
Hemoglobin: 14.7 g/dL (ref 12.0–15.0)
Immature Granulocytes: 0 %
Lymphocytes Relative: 22 %
Lymphs Abs: 1.5 10*3/uL (ref 0.7–4.0)
MCH: 29.3 pg (ref 26.0–34.0)
MCHC: 34.5 g/dL (ref 30.0–36.0)
MCV: 84.9 fL (ref 80.0–100.0)
Monocytes Absolute: 0.5 10*3/uL (ref 0.1–1.0)
Monocytes Relative: 7 %
Neutro Abs: 4.5 10*3/uL (ref 1.7–7.7)
Neutrophils Relative %: 68 %
Platelets: 271 10*3/uL (ref 150–400)
RBC: 5.02 MIL/uL (ref 3.87–5.11)
RDW: 12.4 % (ref 11.5–15.5)
WBC: 6.6 10*3/uL (ref 4.0–10.5)
nRBC: 0 % (ref 0.0–0.2)

## 2021-10-09 LAB — COMPREHENSIVE METABOLIC PANEL
ALT: 11 U/L (ref 0–44)
AST: 14 U/L — ABNORMAL LOW (ref 15–41)
Albumin: 4 g/dL (ref 3.5–5.0)
Alkaline Phosphatase: 59 U/L (ref 38–126)
Anion gap: 8 (ref 5–15)
BUN: 11 mg/dL (ref 6–20)
CO2: 28 mmol/L (ref 22–32)
Calcium: 9.1 mg/dL (ref 8.9–10.3)
Chloride: 108 mmol/L (ref 98–111)
Creatinine, Ser: 0.77 mg/dL (ref 0.44–1.00)
GFR, Estimated: >60 mL/min (ref >60)
Glucose, Bld: 101 mg/dL — ABNORMAL HIGH (ref 70–99)
Potassium: 3.3 mmol/L — ABNORMAL LOW (ref 3.5–5.1)
Sodium: 144 mmol/L (ref 135–145)
Total Bilirubin: 0.5 mg/dL (ref 0.3–1.2)
Total Protein: 6.9 g/dL (ref 6.5–8.1)

## 2021-10-09 MED ORDER — TRASTUZUMAB-DKST CHEMO 150 MG IV SOLR
8.0000 mg/kg | Freq: Once | INTRAVENOUS | Status: AC
  Administered 2021-10-09: 525 mg via INTRAVENOUS
  Filled 2021-10-09: qty 25

## 2021-10-09 MED ORDER — SODIUM CHLORIDE 0.9 % IV SOLN
10.0000 mg | Freq: Once | INTRAVENOUS | Status: AC
  Administered 2021-10-09: 10 mg via INTRAVENOUS
  Filled 2021-10-09: qty 10

## 2021-10-09 MED ORDER — SODIUM CHLORIDE 0.9 % IV SOLN
75.0000 mg/m2 | Freq: Once | INTRAVENOUS | Status: AC
  Administered 2021-10-09: 130 mg via INTRAVENOUS
  Filled 2021-10-09: qty 13

## 2021-10-09 MED ORDER — ACETAMINOPHEN 325 MG PO TABS
650.0000 mg | ORAL_TABLET | Freq: Once | ORAL | Status: AC
  Administered 2021-10-09: 650 mg via ORAL
  Filled 2021-10-09: qty 2

## 2021-10-09 MED ORDER — SODIUM CHLORIDE 0.9% FLUSH
10.0000 mL | Freq: Once | INTRAVENOUS | Status: AC
  Administered 2021-10-09: 10 mL

## 2021-10-09 MED ORDER — SODIUM CHLORIDE 0.9 % IV SOLN: Freq: Once | INTRAVENOUS | Status: AC

## 2021-10-09 MED ORDER — ONDANSETRON HCL 8 MG PO TABS: 8.0000 mg | ORAL_TABLET | Freq: Two times a day (BID) | ORAL | 0 refills | Status: DC

## 2021-10-09 MED ORDER — DEXAMETHASONE 4 MG PO TABS: ORAL_TABLET | ORAL | 1 refills | Status: DC

## 2021-10-09 MED ORDER — LORAZEPAM 0.5 MG PO TABS: 0.5000 mg | ORAL_TABLET | Freq: Four times a day (QID) | ORAL | 0 refills | Status: DC | PRN

## 2021-10-09 MED ORDER — SODIUM CHLORIDE 0.9 % IV SOLN
840.0000 mg | Freq: Once | INTRAVENOUS | Status: AC
  Administered 2021-10-09: 840 mg via INTRAVENOUS
  Filled 2021-10-09: qty 28

## 2021-10-09 MED ORDER — DIPHENHYDRAMINE HCL 25 MG PO CAPS
50.0000 mg | ORAL_CAPSULE | Freq: Once | ORAL | Status: AC
  Administered 2021-10-09: 50 mg via ORAL
  Filled 2021-10-09: qty 2

## 2021-10-09 MED ORDER — LIDOCAINE-PRILOCAINE 2.5-2.5 % EX CREA: 1.0000 "application " | TOPICAL_CREAM | CUTANEOUS | 0 refills | Status: DC | PRN

## 2021-10-09 MED ORDER — PROCHLORPERAZINE MALEATE 10 MG PO TABS: 10.0000 mg | ORAL_TABLET | Freq: Four times a day (QID) | ORAL | 0 refills | Status: DC | PRN

## 2021-10-09 NOTE — Patient Instructions (Addendum)
[facility]  Discharge Instructions: ?Thank you for choosing [facility] to provide your oncology and hematology care.  ? ?If you have a lab appointment with the [facility], please go directly to the [facility] and check in at the registration area. ?  ?Wear comfortable clothing and clothing appropriate for easy access to any Portacath or PICC line.  ? ?We strive to give you quality time with your provider. You may need to reschedule your appointment if you arrive late (15 or more minutes).  Arriving late affects you and other patients whose appointments are after yours.  Also, if you miss three or more appointments without notifying the office, you may be dismissed from the clinic at the provider?s discretion.    ?  ?For prescription refill requests, have your pharmacy contact our office and allow 72 hours for refills to be completed.   ? ?Today you received the following chemotherapy and/or immunotherapy agents: Trastuzumab, Docetaxel, Pertuzumab   ?  ?To help prevent nausea and vomiting after your treatment, we encourage you to take your nausea medication as directed. ? ?BELOW ARE SYMPTOMS THAT SHOULD BE REPORTED IMMEDIATELY: ?*FEVER GREATER THAN 100.4 F (38 ?C) OR HIGHER ?*CHILLS OR SWEATING ?*NAUSEA AND VOMITING THAT IS NOT CONTROLLED WITH YOUR NAUSEA MEDICATION ?*UNUSUAL SHORTNESS OF BREATH ?*UNUSUAL BRUISING OR BLEEDING ?*URINARY PROBLEMS (pain or burning when urinating, or frequent urination) ?*BOWEL PROBLEMS (unusual diarrhea, constipation, pain near the anus) ?TENDERNESS IN MOUTH AND THROAT WITH OR WITHOUT PRESENCE OF ULCERS (sore throat, sores in mouth, or a toothache) ?UNUSUAL RASH, SWELLING OR PAIN  ?UNUSUAL VAGINAL DISCHARGE OR ITCHING  ? ?Items with * indicate a potential emergency and should be followed up as soon as possible or go to the Emergency Department if any problems should occur. ? ?Please show the CHEMOTHERAPY ALERT CARD or IMMUNOTHERAPY  ALERT CARD at check-in to the Emergency Department and triage nurse. ? ?Should you have questions after your visit or need to cancel or reschedule your appointment, please contact [facility]  Dept: [phone]  and follow the prompts.  Office hours are 8:00 a.m. to 4:30 p.m. Monday - Friday. Please note that voicemails left after 4:00 p.m. may not be returned until the following business day.  We are closed weekends and major holidays. You have access to a nurse at all times for urgent questions. Please call the main number to the clinic Dept: [phone] and follow the prompts. ? ? ?For any non-urgent questions, you may also contact your provider using MyChart. We now offer e-Visits for anyone 77 and older to request care online for non-urgent symptoms. For details visit [email or web address]. ?  ?Also download the MyChart app! Go to the app store, search "MyChart", open the app, select [facility], and log in with your MyChart username and password. ? ?Due to Covid, a mask is required upon entering the hospital/clinic. If you do not have a mask, one will be given to you upon arrival. For doctor visits, patients may have 1 support person aged 78 or older with them. For treatment visits, patients cannot have anyone with them due to current Covid guidelines and our immunocompromised population.  ? ?

## 2021-10-12 ENCOUNTER — Telehealth: Payer: Self-pay

## 2021-10-12 ENCOUNTER — Inpatient Hospital Stay: Payer: Federal, State, Local not specified - PPO | Attending: Hematology and Oncology

## 2021-10-12 ENCOUNTER — Other Ambulatory Visit: Payer: Self-pay

## 2021-10-12 VITALS — BP 142/104 | HR 97 | Temp 98.3°F | Resp 18

## 2021-10-12 DIAGNOSIS — J910 Malignant pleural effusion: Secondary | ICD-10-CM | POA: Diagnosis not present

## 2021-10-12 DIAGNOSIS — Z923 Personal history of irradiation: Secondary | ICD-10-CM | POA: Diagnosis not present

## 2021-10-12 DIAGNOSIS — Z170 Estrogen receptor positive status [ER+]: Secondary | ICD-10-CM | POA: Insufficient documentation

## 2021-10-12 DIAGNOSIS — C50211 Malignant neoplasm of upper-inner quadrant of right female breast: Secondary | ICD-10-CM | POA: Insufficient documentation

## 2021-10-12 DIAGNOSIS — R5383 Other fatigue: Secondary | ICD-10-CM | POA: Diagnosis not present

## 2021-10-12 DIAGNOSIS — Z9221 Personal history of antineoplastic chemotherapy: Secondary | ICD-10-CM | POA: Insufficient documentation

## 2021-10-12 DIAGNOSIS — R000 Tachycardia, unspecified: Secondary | ICD-10-CM | POA: Insufficient documentation

## 2021-10-12 DIAGNOSIS — Z5111 Encounter for antineoplastic chemotherapy: Secondary | ICD-10-CM | POA: Insufficient documentation

## 2021-10-12 DIAGNOSIS — T451X5A Adverse effect of antineoplastic and immunosuppressive drugs, initial encounter: Secondary | ICD-10-CM | POA: Diagnosis not present

## 2021-10-12 DIAGNOSIS — Z5112 Encounter for antineoplastic immunotherapy: Secondary | ICD-10-CM | POA: Insufficient documentation

## 2021-10-12 DIAGNOSIS — Z5189 Encounter for other specified aftercare: Secondary | ICD-10-CM | POA: Diagnosis not present

## 2021-10-12 DIAGNOSIS — Z79810 Long term (current) use of selective estrogen receptor modulators (SERMs): Secondary | ICD-10-CM | POA: Insufficient documentation

## 2021-10-12 DIAGNOSIS — K521 Toxic gastroenteritis and colitis: Secondary | ICD-10-CM | POA: Diagnosis not present

## 2021-10-12 MED ORDER — PEGFILGRASTIM-CBQV 6 MG/0.6ML ~~LOC~~ SOSY
6.0000 mg | PREFILLED_SYRINGE | Freq: Once | SUBCUTANEOUS | Status: AC
  Administered 2021-10-12: 6 mg via SUBCUTANEOUS
  Filled 2021-10-12: qty 0.6

## 2021-10-12 NOTE — Telephone Encounter (Signed)
-----   Message from Tildon Husky, RN sent at 10/09/2021  4:02 PM EDT ----- ?Regarding: first time treatment call back -IRUKU ?Patinet received herceptin, taxotere, and perjeta for the first time today. She is seen by Dr. Chryl Heck. All went well with no issues.  ? ?

## 2021-10-12 NOTE — Telephone Encounter (Signed)
Sherry Barry states that she is feeling achy and tired.  Suggested that she try tylenol and apply a heating pad to the achy areas.  She is eating, drinking, and urinating well. ?She knows the office number [phone] if she has any questions or concerns. ?

## 2021-10-15 ENCOUNTER — Inpatient Hospital Stay: Payer: Federal, State, Local not specified - PPO | Admitting: Hematology and Oncology

## 2021-10-16 ENCOUNTER — Inpatient Hospital Stay (HOSPITAL_BASED_OUTPATIENT_CLINIC_OR_DEPARTMENT_OTHER): Payer: Federal, State, Local not specified - PPO | Admitting: Hematology and Oncology

## 2021-10-16 ENCOUNTER — Other Ambulatory Visit: Payer: Self-pay | Admitting: *Deleted

## 2021-10-16 ENCOUNTER — Inpatient Hospital Stay: Payer: Federal, State, Local not specified - PPO

## 2021-10-16 ENCOUNTER — Other Ambulatory Visit: Payer: Self-pay

## 2021-10-16 VITALS — BP 134/95 | HR 106 | Temp 98.0°F | Resp 18

## 2021-10-16 DIAGNOSIS — T451X5A Adverse effect of antineoplastic and immunosuppressive drugs, initial encounter: Secondary | ICD-10-CM | POA: Diagnosis not present

## 2021-10-16 DIAGNOSIS — Z5111 Encounter for antineoplastic chemotherapy: Secondary | ICD-10-CM | POA: Diagnosis not present

## 2021-10-16 DIAGNOSIS — Z9221 Personal history of antineoplastic chemotherapy: Secondary | ICD-10-CM | POA: Diagnosis not present

## 2021-10-16 DIAGNOSIS — K521 Toxic gastroenteritis and colitis: Secondary | ICD-10-CM

## 2021-10-16 DIAGNOSIS — Z5189 Encounter for other specified aftercare: Secondary | ICD-10-CM | POA: Diagnosis not present

## 2021-10-16 DIAGNOSIS — R5383 Other fatigue: Secondary | ICD-10-CM | POA: Diagnosis not present

## 2021-10-16 DIAGNOSIS — Z79810 Long term (current) use of selective estrogen receptor modulators (SERMs): Secondary | ICD-10-CM | POA: Diagnosis not present

## 2021-10-16 DIAGNOSIS — C50919 Malignant neoplasm of unspecified site of unspecified female breast: Secondary | ICD-10-CM

## 2021-10-16 DIAGNOSIS — Z170 Estrogen receptor positive status [ER+]: Secondary | ICD-10-CM

## 2021-10-16 DIAGNOSIS — C50211 Malignant neoplasm of upper-inner quadrant of right female breast: Secondary | ICD-10-CM | POA: Diagnosis not present

## 2021-10-16 DIAGNOSIS — R000 Tachycardia, unspecified: Secondary | ICD-10-CM | POA: Diagnosis not present

## 2021-10-16 DIAGNOSIS — Z923 Personal history of irradiation: Secondary | ICD-10-CM | POA: Diagnosis not present

## 2021-10-16 DIAGNOSIS — Z5112 Encounter for antineoplastic immunotherapy: Secondary | ICD-10-CM | POA: Diagnosis not present

## 2021-10-16 DIAGNOSIS — J910 Malignant pleural effusion: Secondary | ICD-10-CM | POA: Insufficient documentation

## 2021-10-16 LAB — CBC WITH DIFFERENTIAL/PLATELET
Abs Immature Granulocytes: 3.08 10*3/uL — ABNORMAL HIGH (ref 0.00–0.07)
Basophils Absolute: 0.1 10*3/uL (ref 0.0–0.1)
Basophils Relative: 0 %
Eosinophils Absolute: 0.1 10*3/uL (ref 0.0–0.5)
Eosinophils Relative: 0 %
HCT: 42.7 % (ref 36.0–46.0)
Hemoglobin: 14.9 g/dL (ref 12.0–15.0)
Immature Granulocytes: 17 %
Lymphocytes Relative: 12 %
Lymphs Abs: 2.2 10*3/uL (ref 0.7–4.0)
MCH: 29.5 pg (ref 26.0–34.0)
MCHC: 34.9 g/dL (ref 30.0–36.0)
MCV: 84.6 fL (ref 80.0–100.0)
Monocytes Absolute: 3.3 10*3/uL — ABNORMAL HIGH (ref 0.1–1.0)
Monocytes Relative: 18 %
Neutro Abs: 9.6 10*3/uL — ABNORMAL HIGH (ref 1.7–7.7)
Neutrophils Relative %: 53 %
Platelets: 215 10*3/uL (ref 150–400)
RBC: 5.05 MIL/uL (ref 3.87–5.11)
RDW: 12 % (ref 11.5–15.5)
WBC: 18.4 10*3/uL — ABNORMAL HIGH (ref 4.0–10.5)
nRBC: 0.2 % (ref 0.0–0.2)

## 2021-10-16 LAB — COMPREHENSIVE METABOLIC PANEL
ALT: 16 U/L (ref 0–44)
AST: 19 U/L (ref 15–41)
Albumin: 3.8 g/dL (ref 3.5–5.0)
Alkaline Phosphatase: 70 U/L (ref 38–126)
Anion gap: 8 (ref 5–15)
BUN: 9 mg/dL (ref 6–20)
CO2: 30 mmol/L (ref 22–32)
Calcium: 9.3 mg/dL (ref 8.9–10.3)
Chloride: 101 mmol/L (ref 98–111)
Creatinine, Ser: 0.87 mg/dL (ref 0.44–1.00)
GFR, Estimated: >60 mL/min (ref >60)
Glucose, Bld: 89 mg/dL (ref 70–99)
Potassium: 3.4 mmol/L — ABNORMAL LOW (ref 3.5–5.1)
Sodium: 139 mmol/L (ref 135–145)
Total Bilirubin: 0.5 mg/dL (ref 0.3–1.2)
Total Protein: 6.9 g/dL (ref 6.5–8.1)

## 2021-10-16 MED ORDER — SODIUM CHLORIDE 0.9 % IV SOLN: INTRAVENOUS | Status: DC

## 2021-10-16 NOTE — Patient Instructions (Signed)
Rehydration, Adult Rehydration is the replacement of body fluids, salts, and minerals (electrolytes) that are lost during dehydration. Dehydration is when there is not enough water or other fluids in the body. This happens when you lose more fluids than you take in. Common causes of dehydration include: Not drinking enough fluids. This can occur when you are ill or doing activities that require a lot of energy, especially in hot weather. Conditions that cause loss of water or other fluids, such as diarrhea, vomiting, sweating, or urinating a lot. Other illnesses, such as fever or infection. Certain medicines, such as those that remove excess fluid from the body (diuretics). Symptoms of mild or moderate dehydration may include thirst, dry lips and mouth, and dizziness. Symptoms of severe dehydration may include increased heart rate, confusion, fainting, and not urinating. For severe dehydration, you may need to get fluids through an IV at the hospital. For mild or moderate dehydration, you can usually rehydrate at home by drinking certain fluids as told by your health care provider. What are the risks? Generally, rehydration is safe. However, taking in too much fluid (overhydration) can be a problem. This is rare. Overhydration can cause an electrolyte imbalance, kidney failure, or a decrease in salt (sodium) levels in the body. Supplies needed You will need an oral rehydration solution (ORS) if your health care provider tells you to use one. This is a drink to treat dehydration. It can be found in pharmacies and retail stores. How to rehydrate Fluids Follow instructions from your health care provider for rehydration. The kind of fluid and the amount you should drink depend on your condition. In general, you should choose drinks that you prefer. If told by your health care provider, drink an ORS. Make an ORS by following instructions on the package. Start by drinking small amounts, about  cup (120  mL) every 5-10 minutes. Slowly increase how much you drink until you have taken the amount recommended by your health care provider. Drink enough clear fluids to keep your urine pale yellow. If you were told to drink an ORS, finish it first, then start slowly drinking other clear fluids. Drink fluids such as: Water. This includes sparkling water and flavored water. Drinking only water can lead to having too little sodium in your body (hyponatremia). Follow the advice of your health care provider. Water from ice chips you suck on. Fruit juice with water you add to it (diluted). Sports drinks. Hot or cold herbal teas. Broth-based soups. Milk or milk products. Food Follow instructions from your health care provider about what to eat while you rehydrate. Your health care provider may recommend that you slowly begin eating regular foods in small amounts. Eat foods that contain a healthy balance of electrolytes, such as bananas, oranges, potatoes, tomatoes, and spinach. Avoid foods that are greasy or contain a lot of sugar. In some cases, you may get nutrition through a feeding tube that is passed through your nose and into your stomach (nasogastric tube, or NG tube). This may be done if you have uncontrolled vomiting or diarrhea. Beverages to avoid  Certain beverages may make dehydration worse. While you rehydrate, avoid drinking alcohol. How to tell if you are recovering from dehydration You may be recovering from dehydration if: You are urinating more often than before you started rehydrating. Your urine is pale yellow. Your energy level improves. You vomit less frequently. You have diarrhea less frequently. Your appetite improves or returns to normal. You feel less dizzy or less light-headed.   Your skin tone and color start to look more normal. Follow these instructions at home: Take over-the-counter and prescription medicines only as told by your health care provider. Do not take sodium  tablets. Doing this can lead to having too much sodium in your body (hypernatremia). Contact a health care provider if: You continue to have symptoms of mild or moderate dehydration, such as: Thirst. Dry lips. Slightly dry mouth. Dizziness. Dark urine or less urine than normal. Muscle cramps. You continue to vomit or have diarrhea. Get help right away if you: Have symptoms of dehydration that get worse. Have a fever. Have a severe headache. Have been vomiting and the following happens: Your vomiting gets worse or does not go away. Your vomit includes blood or green matter (bile). You cannot eat or drink without vomiting. Have problems with urination or bowel movements, such as: Diarrhea that gets worse or does not go away. Blood in your stool (feces). This may cause stool to look black and tarry. Not urinating, or urinating only a small amount of very dark urine, within 6-8 hours. Have trouble breathing. Have symptoms that get worse with treatment. These symptoms may represent a serious problem that is an emergency. Do not wait to see if the symptoms will go away. Get medical help right away. Call your local emergency services (911 in the [place] Do not drive yourself to the hospital. Summary Rehydration is the replacement of body fluids and minerals (electrolytes) that are lost during dehydration. Follow instructions from your health care provider for rehydration. The kind of fluid and amount you should drink depend on your condition. Slowly increase how much you drink until you have taken the amount recommended by your health care provider. Contact your health care provider if you continue to show signs of mild or moderate dehydration. This information is not intended to replace advice given to you by your health care provider. Make sure you discuss any questions you have with your health care provider. Document Revised: 08/01/2019 Document Reviewed: 06/11/2019 Elsevier Patient  Education  2023 [employer]  

## 2021-10-16 NOTE — Assessment & Plan Note (Signed)
Grade 1, bowel movements approximately 5 a day.  Since there is no evidence of any abdominal infection, have recommended trying Imodium as needed.  Since she is also a bit tachycardic and feeling fatigued, will proceed with intravenous fluids 1 L over an hour today. ?

## 2021-10-16 NOTE — Assessment & Plan Note (Signed)
This is a very pleasant 51 year old perimenopausal female patient with past medical history significant for right sided breast cancer, grade 3, triple positive at diagnosis status post bilateral mastectomy and right sentinel lymph node biopsy followed by adjuvant chemotherapy with carboplatin, docetaxel and trastuzumab for 6 cycles followed by maintenance trastuzumab for 1 year, adjuvant radiation, tamoxifen for 5 years now presents with recurrent breast cancer.  She initially presented to the hospital with chief complaint of pleuritic chest pain, cough and dyspnea and was found to have left-sided pleural effusion and a couple sclerotic lesions in the spine.  Cytology from the left-sided pleural effusion suggested metastatic adenocarcinoma, prognostics pending. ? ?PET/CT reviewed along with imaging.  We have discussed that there is evidence of malignant left pleural effusion with extensive areas of nodularity and hypermetabolic activity involving the mediastinal border and pericardium as well as inferior aspect of costodiaphragmatic recess.  Nodal disease at the thoracic inlet on the left, suspect extension of disease below the diaphragm along the left anterolateral aorta.  Metabolic activity about the base of tongue bilaterally.  Sclerotic foci without marked increased activity, some asymmetric uptake favoring the right lingual tonsil.  MRI brain negative. ?We have discussed that prognostics from pleural fluid suggest this is ER/PR strongly positive and HER2 negative. ? ?However since her original biopsy was HER2 amplified, have requested FISH testing on the pleural sample as well as discussed with the pathologist who suggested a second biopsy.  Pleural biopsy did confirm HER2 amplified tumor. ? ?I have recommended proceeding with docetaxel Herceptin and Perjeta for first-line metastatic breast cancer.  She is here post cycle 1 of 4 docetaxel Herceptin and Perjeta. ?She is tolerating this reasonably well.  She will  return to clinic before cycle 2 of D1 ?

## 2021-10-16 NOTE — Assessment & Plan Note (Addendum)
Malignant pleural effusion, improved after thoracentesis.  Clinically also she is less symptomatic today.  We will continue to monitor this.  Most likely with a treatment response, she may not need any more thoracentesis.Sherry Barry ?

## 2021-10-16 NOTE — Progress Notes (Signed)
[facility] ?CONSULT NOTE ? ?Patient Care Team: ?[name], NP as PCP - General (Family Medicine) ?Servando Salina, MD as Consulting Physician (Obstetrics and Gynecology) ?[name], MD as Consulting Physician (Hematology and Oncology) ? ?CHIEF COMPLAINTS/PURPOSE OF CONSULTATION:  ?Newly diagnosed breast cancer ? ?HISTORY OF PRESENTING ILLNESS:  ?Sherry Barry 51 y.o. female is here because of recent diagnosis of recurrent breast cancer. ? ?I reviewed her records extensively and collaborated the history with the patient. ? ?SUMMARY OF ONCOLOGIC HISTORY: ?Oncology History  ?Malignant neoplasm of upper-inner quadrant of right breast in female, estrogen receptor positive ([place])  ?07/13/2012 Clinical Stage  ? Stage IA: T1c N0 ? ?  ?07/14/2012 Definitive Surgery  ? Bilateral mastectomy/right SLNB: RIGHT invasive ductal carcinoma, grade 3, ER+, PR +, Her2/neu positive (ratio 3.02), Ki67 48%. DCIS. 1/4 LN positive for malignancy. LEFT: benign ? ?  ?07/14/2012 Pathologic Stage  ? Stage IIB: T2 N1a M0 ? ?  ?07/14/2012 Cancer Staging  ? Staging form: Breast, AJCC 7th Edition ?- Pathologic stage from 07/14/2012: Stage IV ([place], NX, M1) - Signed by [name], MD on 08/28/2021 ?Specimen type: Core Needle Biopsy ?Histopathologic type: 9931 ?Laterality: Right ? ?  ?08/17/2012 - 08/30/2013 Chemotherapy  ? Adjuvant carboplatin, docetaxel, and trastuzumab x 6 cycles (completed 11/30/2012) followed by maintenance trastuzumab to total one year ? ?  ?11/2012 Procedure  ? Comp Cancer Gene panel ([facility]) negative for deleterious mutations ? ?  ? - 02/2013 Radiation Therapy  ? Adjuvant RT to right breast ? ?  ?02/2013 -  Anti-estrogen oral therapy  ? Tamoxifen 20 mg daily ? ?  ?09/24/2013 Surgery  ? Bilateral breast reconstruction with latissmus flap and expander placement ? ?  ?12/12/2013 Surgery  ? Implant placement ? ?  ? Relapse/Recurrence  ? Left sided malignant pleural effusion.  Tumor cells are positive  for GATA3 and ER, negative for TTF-1 consistent with recurrent breast carcinoma ?Prognostic showed ER 90% positive strong staining, PR 80% positive strong staining, HER2 negative. ? ?  ?09/16/2021 Imaging  ? PET scan showed malignant left pleural effusion with extensive areas of nodularity and hypermetabolic activity involving the mediastinal border and pericardium as well as the most inferior aspects of the costodiaphragmatic recess.  Signs of nodal disease at the thoracic inlet on the left suspect extension of disease below the diaphragm along the left anterolateral aorta.  Nonspecific moderate to markedly increased metabolic activity about the base of the tongue bilaterally.  Consider direct visualization showing mildly asymmetric uptake favoring the right lingual tonsil.  Sclerotic foci without marked increased metabolic activity suspicious for metastatic disease perhaps treated or questions.  Heterogeneous marrow uptake seen elsewhere generalized and nonspecific.  Consider spinal MRI is warranted ? ?MRI brain without any evidence of intracranial metastatic disease. ?  ?10/09/2021 -  Chemotherapy  ? Patient is on Treatment Plan : BREAST DOCEtaxel + Trastuzumab + Pertuzumab (THP) q21d x 8 cycles / Trastuzumab + Pertuzumab q21d x 4 cycles  ? ?  ?  ? ?Patient is here for an appointment with her husband. ?She says that chemotherapy is making her tired, but otherwise feeling well. ?She has noticed some diarrhea about 5 bowel movements a day.  No mucus or pus in the stool.  No fevers or chills.  She has noted some abdominal cramps when she has to have a bowel movement.  She also had 1 L of pleural fluid drained and she definitely feels better from shortness of breath standpoint. ?She wonders if she  can get some IV fluids. ?Rest of the pertinent 10 point ROS reviewed and negative. ? ?MEDICAL HISTORY:  ?Past Medical History:  ?Diagnosis Date  ? Allergy   ? Breast cancer ([place])   ? Breast cancer ([place])   ? right  ? History of  chemotherapy   ? doxetaxel/carboplatin/trastuzumab  ? History of migraine   ? last one about a week ago  ? Hx of radiation therapy 01/01/13- 02/15/13  ? r chest wall, r supraclav/axilla 5040 cGy/28 sessions, scar boost 1000 cGy/5 sessions  ? Hypertension   ? no meds,   urgent care on [place]  ? Migraine   ? Migraine   ? ? ?SURGICAL HISTORY: ?Past Surgical History:  ?Procedure Laterality Date  ? ABDOMINAL HYSTERECTOMY    ? no salpingo-oophorectomy 2009  ? BREAST RECONSTRUCTION WITH PLACEMENT OF TISSUE EXPANDER AND FLEX HD (ACELLULAR HYDRATED DERMIS) Left 09/24/2013  ? IR IMAGING GUIDED PORT INSERTION  10/07/2021  ? IR THORACENTESIS ASP PLEURAL SPACE W/IMG GUIDE  08/11/2021  ? IR THORACENTESIS ASP PLEURAL SPACE W/IMG GUIDE  10/07/2021  ? LATISSIMUS FLAP TO BREAST Right 09/24/2013  ? Procedure: RIGHT LATISSMUS MYOCUTAEIOUS MUSCLE FLAP AND PLACEMENT OF TISSUE [name];  Surgeon: Theodoro Kos, DO;  Location: [facility];  Service: Plastics;  Laterality: Right;  ? LIPOSUCTION WITH LIPOFILLING Bilateral 12/12/2013  ? Procedure: LIPOSUCTION WITH LIPOFILLING;  Surgeon: Theodoro Kos, DO;  Location: [facility];  Service: Plastics;  Laterality: Bilateral;  ? PORT-A-CATH REMOVAL Left 12/12/2013  ? Procedure: REMOVAL PORT-A-CATH;  Surgeon: Theodoro Kos, DO;  Location: [facility];  Service: Plastics;  Laterality: Left;  ? PORTACATH PLACEMENT  07/14/2012  ? Procedure: INSERTION PORT-A-CATH;  Surgeon: Joyice Faster. Cornett, MD;  Location: [facility];  Service: General;  Laterality: Left;  ? RECONSTRUCTION BREAST W/ LATISSIMUS DORSI FLAP Right 09/24/2013  ? "& tissue expander placement"  ? REMOVAL OF BILATERAL TISSUE EXPANDERS WITH PLACEMENT OF BILATERAL BREAST IMPLANTS Bilateral 12/12/2013  ? Procedure: REMOVAL OF BILATERAL TISSUE EXPANDERS WITH PLACEMENT OF BILATERAL BREAST IMPLANTS/BILATERAL CAPSULECTOMIES WITH  LIPOFILLING FAT GRAFTING;  Surgeon: Theodoro Kos, DO;  Location: [facility];  Service: Plastics;   Laterality: Bilateral;  ? SIMPLE MASTECTOMY WITH AXILLARY SENTINEL NODE BIOPSY  07/14/2012  ? Procedure: SIMPLE MASTECTOMY WITH AXILLARY SENTINEL NODE BIOPSY;  Surgeon: Joyice Faster. Cornett, MD;  Location: [facility];  Service: General;  Laterality: Right;  Bilateral simple mastectomy with port and right sebtibel lymph node mapping  ? SIMPLE MASTECTOMY WITH AXILLARY SENTINEL NODE BIOPSY  07/14/2012  ? Procedure: SIMPLE MASTECTOMY;  Surgeon: Joyice Faster. Cornett, MD;  Location: [facility];  Service: General;  Laterality: Left;  ? TISSUE EXPANDER PLACEMENT Left 09/24/2013  ? Procedure: PLACEMENT OF TISSUE EXPANDER AND FLEX HD TO LEFT BREAST;  Surgeon: Theodoro Kos, DO;  Location: [facility];  Service: Plastics;  Laterality: Left;  ? ? ?SOCIAL HISTORY: ?Social History  ? ?Socioeconomic History  ? Marital status: Married  ?  Spouse name: Not on file  ? Number of children: 2  ? Years of education: Not on file  ? Highest education level: Not on file  ?Occupational History  ?  Employer: [employer]  ?Tobacco Use  ? Smoking status: Never  ? Smokeless tobacco: Never  ?Substance and Sexual Activity  ? Alcohol use: Yes  ?  Comment: occasional  ? Drug use: No  ? Sexual activity: Yes  ?  Birth control/protection: Surgical  ?Other Topics Concern  ? Not on file  ?  Social History Narrative  ? Not on file  ? ?Social Determinants of Health  ? ?Financial Resource Strain: Not on file  ?Food Insecurity: Not on file  ?Transportation Needs: Not on file  ?Physical Activity: Not on file  ?Stress: Not on file  ?Social Connections: Not on file  ?Intimate Partner Violence: Not on file  ? ? ?FAMILY HISTORY: ?Family History  ?Problem Relation Age of Onset  ? Hypertension Mother   ? Alcohol abuse Mother   ? Heart disease Maternal Grandmother   ? Stroke Maternal Grandfather   ? Colon cancer Maternal Aunt 53  ?     alive at 34  ? Brain cancer Maternal Uncle 62  ?     and lymphoma in early 52s; deceased  ? Brain cancer Maternal Uncle 60  ?     deceased  ? Pancreatic  cancer Maternal Uncle 18  ?     alive at 43  ? Breast cancer Cousin 37  ?     mat 1st cousin once removed through mat GF ; deceased  ? Breast cancer Maternal Aunt   ?     great aunt through mat GF; dx at ?

## 2021-10-17 ENCOUNTER — Other Ambulatory Visit: Payer: Self-pay

## 2021-10-17 ENCOUNTER — Encounter (HOSPITAL_COMMUNITY): Payer: Self-pay | Admitting: Emergency Medicine

## 2021-10-17 ENCOUNTER — Emergency Department (HOSPITAL_COMMUNITY)
Admission: EM | Admit: 2021-10-17 | Discharge: 2021-10-18 | Disposition: A | Discharge status: Home / Self Care | Payer: Federal, State, Local not specified - PPO | Attending: Emergency Medicine | Admitting: Emergency Medicine

## 2021-10-17 ENCOUNTER — Emergency Department (HOSPITAL_COMMUNITY): Payer: Federal, State, Local not specified - PPO

## 2021-10-17 DIAGNOSIS — Z91040 Latex allergy status: Secondary | ICD-10-CM | POA: Insufficient documentation

## 2021-10-17 DIAGNOSIS — R21 Rash and other nonspecific skin eruption: Secondary | ICD-10-CM | POA: Diagnosis not present

## 2021-10-17 DIAGNOSIS — Z853 Personal history of malignant neoplasm of breast: Secondary | ICD-10-CM | POA: Insufficient documentation

## 2021-10-17 DIAGNOSIS — J90 Pleural effusion, not elsewhere classified: Secondary | ICD-10-CM | POA: Diagnosis not present

## 2021-10-17 DIAGNOSIS — B349 Viral infection, unspecified: Secondary | ICD-10-CM | POA: Insufficient documentation

## 2021-10-17 DIAGNOSIS — Z20822 Contact with and (suspected) exposure to covid-19: Secondary | ICD-10-CM | POA: Diagnosis not present

## 2021-10-17 DIAGNOSIS — R000 Tachycardia, unspecified: Secondary | ICD-10-CM | POA: Insufficient documentation

## 2021-10-17 DIAGNOSIS — R509 Fever, unspecified: Secondary | ICD-10-CM | POA: Diagnosis not present

## 2021-10-17 LAB — COMPREHENSIVE METABOLIC PANEL
ALT: 18 U/L (ref 0–44)
AST: 32 U/L (ref 15–41)
Albumin: 3.3 g/dL — ABNORMAL LOW (ref 3.5–5.0)
Alkaline Phosphatase: 127 U/L — ABNORMAL HIGH (ref 38–126)
Anion gap: 11 (ref 5–15)
BUN: <5 mg/dL — ABNORMAL LOW (ref 6–20)
CO2: 26 mmol/L (ref 22–32)
Calcium: 8.8 mg/dL — ABNORMAL LOW (ref 8.9–10.3)
Chloride: 102 mmol/L (ref 98–111)
Creatinine, Ser: 0.93 mg/dL (ref 0.44–1.00)
GFR, Estimated: >60 mL/min (ref >60)
Glucose, Bld: 97 mg/dL (ref 70–99)
Potassium: 3.1 mmol/L — ABNORMAL LOW (ref 3.5–5.1)
Sodium: 139 mmol/L (ref 135–145)
Total Bilirubin: 0.6 mg/dL (ref 0.3–1.2)
Total Protein: 6.3 g/dL — ABNORMAL LOW (ref 6.5–8.1)

## 2021-10-17 LAB — CBC WITH DIFFERENTIAL/PLATELET
Abs Immature Granulocytes: 11.59 10*3/uL — ABNORMAL HIGH (ref 0.00–0.07)
Basophils Absolute: 0 10*3/uL (ref 0.0–0.1)
Basophils Relative: 0 %
Eosinophils Absolute: 0 10*3/uL (ref 0.0–0.5)
Eosinophils Relative: 0 %
HCT: 42.6 % (ref 36.0–46.0)
Hemoglobin: 14 g/dL (ref 12.0–15.0)
Immature Granulocytes: 17 %
Lymphocytes Relative: 5 %
Lymphs Abs: 3.5 10*3/uL (ref 0.7–4.0)
MCH: 28.8 pg (ref 26.0–34.0)
MCHC: 32.9 g/dL (ref 30.0–36.0)
MCV: 87.7 fL (ref 80.0–100.0)
Monocytes Absolute: 6 10*3/uL — ABNORMAL HIGH (ref 0.1–1.0)
Monocytes Relative: 9 %
Neutro Abs: 48.7 10*3/uL — ABNORMAL HIGH (ref 1.7–7.7)
Neutrophils Relative %: 69 %
Platelets: 234 10*3/uL (ref 150–400)
RBC: 4.86 MIL/uL (ref 3.87–5.11)
RDW: 12.2 % (ref 11.5–15.5)
WBC: 69.8 10*3/uL (ref 4.0–10.5)
nRBC: 0.1 % (ref 0.0–0.2)

## 2021-10-17 LAB — URINALYSIS, ROUTINE W REFLEX MICROSCOPIC
Bacteria, UA: NONE SEEN
Bilirubin Urine: NEGATIVE
Glucose, UA: NEGATIVE mg/dL
Ketones, ur: NEGATIVE mg/dL
Nitrite: NEGATIVE
Protein, ur: NEGATIVE mg/dL
Specific Gravity, Urine: 1.003 — ABNORMAL LOW (ref 1.005–1.030)
pH: 7 (ref 5.0–8.0)

## 2021-10-17 LAB — RESP PANEL BY RT-PCR (FLU A&B, COVID) ARPGX2
Influenza A by PCR: NEGATIVE
Influenza B by PCR: NEGATIVE
SARS Coronavirus 2 by RT PCR: NEGATIVE

## 2021-10-17 LAB — LACTIC ACID, PLASMA: Lactic Acid, Venous: 1.3 mmol/L (ref 0.5–1.9)

## 2021-10-17 LAB — PROTIME-INR
INR: 1.2 (ref 0.8–1.2)
Prothrombin Time: 14.6 seconds (ref 11.4–15.2)

## 2021-10-17 LAB — APTT: aPTT: 25 seconds (ref 24–36)

## 2021-10-17 LAB — I-STAT BETA HCG BLOOD, ED (MC, WL, AP ONLY): I-stat hCG, quantitative: <5 m[IU]/mL (ref <5)

## 2021-10-17 MED ORDER — POTASSIUM CHLORIDE CRYS ER 20 MEQ PO TBCR: 40.0000 meq | EXTENDED_RELEASE_TABLET | Freq: Once | ORAL | Status: DC

## 2021-10-17 MED ORDER — VANCOMYCIN HCL IN DEXTROSE 1-5 GM/200ML-% IV SOLN: 1000.0000 mg | Freq: Once | INTRAVENOUS | Status: DC

## 2021-10-17 MED ORDER — HEPARIN SOD (PORK) LOCK FLUSH 100 UNIT/ML IV SOLN
500.0000 [IU] | Freq: Once | INTRAVENOUS | Status: AC
  Administered 2021-10-17: 500 [IU]
  Filled 2021-10-17: qty 5

## 2021-10-17 MED ORDER — VANCOMYCIN HCL 1500 MG/300ML IV SOLN
1500.0000 mg | Freq: Once | INTRAVENOUS | Status: AC
  Administered 2021-10-17: 1500 mg via INTRAVENOUS
  Filled 2021-10-17: qty 300

## 2021-10-17 MED ORDER — LACTATED RINGERS IV SOLN: INTRAVENOUS | Status: DC

## 2021-10-17 MED ORDER — LACTATED RINGERS IV BOLUS (SEPSIS)
1000.0000 mL | Freq: Once | INTRAVENOUS | Status: AC
  Administered 2021-10-17: 1000 mL via INTRAVENOUS

## 2021-10-17 MED ORDER — VANCOMYCIN HCL 1250 MG/250ML IV SOLN
1250.0000 mg | Freq: Once | INTRAVENOUS | Status: DC
Start: 2021-10-17 — End: 2021-10-17
  Filled 2021-10-17: qty 250

## 2021-10-17 MED ORDER — SODIUM CHLORIDE 0.9 % IV SOLN
2.0000 g | Freq: Once | INTRAVENOUS | Status: AC
  Administered 2021-10-17: 2 g via INTRAVENOUS
  Filled 2021-10-17: qty 12.5

## 2021-10-17 MED ORDER — VANCOMYCIN HCL 1250 MG/250ML IV SOLN
1250.0000 mg | INTRAVENOUS | Status: DC
Start: 2021-10-18 — End: 2021-10-18

## 2021-10-17 NOTE — Progress Notes (Signed)
Pharmacy Antibiotic Note ? ?Sherry Barry is a 51 y.o. female for which pharmacy has been consulted for cefepime and vancomycin dosing for  concern for neutropenic fever . ? ?Patient with a history of breast cancer with recurrence and lung mets, recently re-started chemotherapy. Patient presenting with fever. ? ?SCr 0.93 - near baseline ?WBC 69.8 (18.4 on 5/5); LA 1.3; T 99.8 F; RR 16 ? ?Plan: ?Cefepime 2g q8hr ?Vancomycin 1500 mg once then 1250 mg q24hr (eAUC 507) unless change in renal function ?Trend WBC, Fever, Renal function, & Clinical course ?F/u cultures, clinical course, WBC, fever ?De-escalate when able ?Levels at steady state ?F/u COVID ? ?  ? ?Temp (24hrs), Avg:99.8 ?F (37.7 ?C), Min:99.8 ?F (37.7 ?C), Max:99.8 ?F (37.7 ?C) ? ?Recent Labs  ?Lab 10/16/21 ?1152  ?WBC 18.4*  ?CREATININE 0.87  ?  ?Estimated Creatinine Clearance: 69.4 mL/min (by C-G formula based on SCr of 0.87 mg/dL).   ? ?Allergies  ?Allergen Reactions  ? Codeine Itching and Hives  ? Hydrocodone Other (See Comments)  ? Latex Hives  ? Lisinopril Cough  ? Tomato Itching  ?  Reaction to canned tomatoes  ? ? ?Antimicrobials this admission: ?vancomycin 5/6 >>  ?cefepime 5/6 >> ? ?Microbiology results: ?Pending ? ?Thank you for allowing pharmacy to be a part of this patient?s care. ? ?Lorelei Pont, PharmD, BCPS ?10/17/2021 7:08 PM ?ED Clinical Pharmacist -  [phone] ?  ?

## 2021-10-17 NOTE — Sepsis Progress Note (Signed)
Elink following code sepsis °

## 2021-10-17 NOTE — ED Provider Notes (Signed)
Patient is a 51 year old female whose care was transferred to me at shift change from Dr. Ronnald Nian.  His HPI as below: ? ?Patient with history of breast cancer now with recurrence and lung mets.  Started chemotherapy last week.  Had port placed last week and had first chemotherapy on 28 April.  She noticed fever of 100.8 today.  Small area of rash on her left hand but history of eczema.  Denies any cough or sputum production.  Has some chills and aches.  No pain with urination.  No abdominal pain, no nausea, no vomiting.  Call the cancer center and they recommended for her to come for infectious work-up.  Nothing has made it worse or better.  Denies any nausea or vomiting. ?Physical Exam  ?BP (!) 134/93   Pulse 94   Temp 99.8 ?F (37.7 ?C) (Oral)   Resp 17   SpO2 94%  ? ?Physical Exam ?Vitals and nursing note reviewed.  ?Constitutional:   ?   General: She is not in acute distress. ?   Appearance: She is well-developed.  ?HENT:  ?   Head: Normocephalic and atraumatic.  ?   Right Ear: External ear normal.  ?   Left Ear: External ear normal.  ?Eyes:  ?   General: No scleral icterus.    ?   Right eye: No discharge.     ?   Left eye: No discharge.  ?   Conjunctiva/sclera: Conjunctivae normal.  ?Neck:  ?   Trachea: No tracheal deviation.  ?Cardiovascular:  ?   Rate and Rhythm: Tachycardia present.  ?Pulmonary:  ?   Effort: Pulmonary effort is normal. No respiratory distress.  ?   Breath sounds: No stridor.  ?Abdominal:  ?   General: There is no distension.  ?Musculoskeletal:     ?   General: No swelling or deformity.  ?   Cervical back: Neck supple.  ?Skin: ?   General: Skin is warm and dry.  ?   Findings: No rash.  ?Neurological:  ?   Mental Status: She is alert.  ?   Cranial Nerves: Cranial nerve deficit: no gross deficits.  ? ? ?Procedures  ?Procedures ? ?ED Course / MDM  ?  ?Medical Decision Making ?Amount and/or Complexity of Data Reviewed ?Labs: ordered. ?Radiology: ordered. ?ECG/medicine tests:  ordered. ? ?Risk ?Prescription drug management. ? ?Patient is a 51 year old female whose care was transferred to me at shift change from Dr. Ronnald Nian.  Please see his note for additional information.  In summary, patient is here with a fever.  She has a history of breast cancer with recurrence and lung metastasis.  Currently receiving chemotherapy.  She had a temperature of 100.8 ?F at home and called the cancer center who then told her to come to the emergency department for evaluation.  Her repeat temperature in the ED is 99.8 ?F.   ? ?Initially sepsis work-up was started to evaluate for possible neutropenic fever.  At the time of shift change patient's lab work had mostly resulted and appeared to be generally reassuring.  Previous EDP discussed patient's case with Dr. Burr Medico with oncology.  They recommended IV fluids and antibiotics.  Blood cultures have been sent.  Patient pending UA and respiratory panel.  Oncology agreed with discharge home if these are reassuring. ? ?These have both since resulted.  Respiratory panel is negative for flu A, flu B, as well as COVID-19.  UA does show small hemoglobin as well as trace leukocytes but no elevation  in white blood cells.  No bacteria.  Does not appear infectious. ? ?On reassessment patient is lying comfortably in bed.  She is eager to be discharged home.  Tachycardia appears to have resolved.  She was given very strict return precautions and she understands to return to the emergency department with any new or worsening symptoms.  Recommended that she follow-up with her oncologist.  Her questions were answered and she was amicable at the time of discharge. ? ? ? ? ?  ?Rayna Sexton, PA-C ?10/17/21 2220 ? ?  ?Lennice Sites, DO ?10/18/21 1059 ? ?

## 2021-10-17 NOTE — Discharge Instructions (Signed)
Like we discussed, please continue to monitor your symptoms closely and return to the emergency department if you develop any new or worsening symptoms whatsoever.  Please follow-up with your oncologist regarding your symptoms as well as this visit today. ?

## 2021-10-17 NOTE — ED Provider Notes (Signed)
?[facility] ?Provider Note ? ? ?CSN: [id number] ?Arrival date & time: 10/17/21  1816 ? ?  ? ?History ? ?Chief Complaint  ?Patient presents with  ? Fever  ?  Chemo pt  ? ? ?Sherry Barry is a 51 y.o. female. ? ?Patient with history of breast cancer now with recurrence and lung mets.  Started chemotherapy last week.  Had port placed last week and had first chemotherapy on 28 April.  She noticed fever of 100.8 today.  Small area of rash on her left hand but history of eczema.  Denies any cough or sputum production.  Has some chills and aches.  No pain with urination.  No abdominal pain, no nausea, no vomiting.  Call the cancer center and they recommended for her to come for infectious work-up.  Nothing has made it worse or better.  Denies any nausea or vomiting. ? ? ? ?  ? ?Home Medications ?Prior to Admission medications   ?Medication Sig Start Date End Date Taking? Authorizing Provider  ?acetaminophen (TYLENOL) 500 MG tablet Take 1,000 mg by mouth every 6 (six) hours as needed.    [provider]  ?amLODipine (NORVASC) 5 MG tablet Take 5 mg by mouth every morning. 07/24/21   [provider]  ?dexamethasone (DECADRON) 4 MG tablet Take 2 tablets (8 mg total) by mouth 2 (two) times daily.  Start the day before Taxotere, then daily after chemo for 2 days 10/09/21   [name], MD  ?lidocaine-prilocaine (EMLA) cream Apply 1 application. topically as needed. 10/09/21   [name], MD  ?LORazepam (ATIVAN) 0.5 MG tablet Take 1 tablet (0.5 mg total) by mouth every 6 (six) hours as needed for anxiety. 10/09/21   [name], MD  ?ondansetron (ZOFRAN) 8 MG tablet Take 1 tablet (8 mg total) by mouth 2 (two) times daily. 10/09/21   [name], MD  ?prochlorperazine (COMPAZINE) 10 MG tablet Take 1 tablet (10 mg total) by mouth every 6 (six) hours as needed for nausea or vomiting. 10/09/21   [name], MD  ?   ? ?Allergies    ?Codeine, Hydrocodone,  Latex, Lisinopril, and Tomato   ? ?Review of Systems   ?Review of Systems ? ?Physical Exam ?Updated Vital Signs ?BP (!) 125/99   Pulse 92   Temp 99.8 ?F (37.7 ?C) (Oral)   Resp 16   SpO2 98%  ?Physical Exam ?Vitals and nursing note reviewed.  ?Constitutional:   ?   General: She is not in acute distress. ?   Appearance: She is well-developed. She is not ill-appearing.  ?HENT:  ?   Head: Normocephalic and atraumatic.  ?   Nose: Nose normal.  ?   Mouth/Throat:  ?   Mouth: Mucous membranes are moist.  ?Eyes:  ?   Extraocular Movements: Extraocular movements intact.  ?   Conjunctiva/sclera: Conjunctivae normal.  ?   Pupils: Pupils are equal, round, and reactive to light.  ?Cardiovascular:  ?   Rate and Rhythm: Normal rate and regular rhythm.  ?   Pulses: Normal pulses.  ?   Heart sounds: Normal heart sounds. No murmur heard. ?Pulmonary:  ?   Effort: Pulmonary effort is normal. No respiratory distress.  ?   Breath sounds: Normal breath sounds.  ?Abdominal:  ?   General: Abdomen is flat.  ?   Palpations: Abdomen is soft.  ?   Tenderness: There is no abdominal tenderness.  ?Musculoskeletal:     ?   General: No  swelling.  ?   Cervical back: Normal range of motion and neck supple.  ?Skin: ?   General: Skin is warm and dry.  ?   Capillary Refill: Capillary refill takes less than 2 seconds.  ?   Comments: Some mild skin irritation on the left hand, some skin irritation around the port site on her chest but no concern for cellulitis in these areas  ?Neurological:  ?   General: No focal deficit present.  ?   Mental Status: She is alert.  ?Psychiatric:     ?   Mood and Affect: Mood normal.  ? ? ?ED Results / Procedures / Treatments   ?Labs ?(all labs ordered are listed, but only abnormal results are displayed) ?Labs Reviewed  ?COMPREHENSIVE METABOLIC PANEL - Abnormal; Notable for the following components:  ?    Result Value  ? Potassium 3.1 (*)   ? BUN <5 (*)   ? Calcium 8.8 (*)   ? Total Protein 6.3 (*)   ? Albumin 3.3 (*)    ? Alkaline Phosphatase 127 (*)   ? All other components within normal limits  ?CBC WITH DIFFERENTIAL/PLATELET - Abnormal; Notable for the following components:  ? WBC 69.8 (*)   ? Neutro Abs 48.7 (*)   ? Monocytes Absolute 6.0 (*)   ? Abs Immature Granulocytes 11.59 (*)   ? All other components within normal limits  ?RESP PANEL BY RT-PCR (FLU A&B, COVID) [id number]  ?CULTURE, BLOOD (ROUTINE X 2)  ?CULTURE, BLOOD (ROUTINE X 2)  ?URINE CULTURE  ?LACTIC ACID, PLASMA  ?PROTIME-INR  ?APTT  ?LACTIC ACID, PLASMA  ?URINALYSIS, ROUTINE W REFLEX MICROSCOPIC  ?PATHOLOGIST SMEAR REVIEW  ?I-STAT BETA HCG BLOOD, ED ([facility], [facility], [facility] ONLY)  ? ? ?EKG ?None ? ?Radiology ?DG Chest Port 1 View ? ?Result Date: 10/17/2021 ?CLINICAL DATA:  Questionable sepsis. EXAM: PORTABLE CHEST 1 VIEW COMPARISON:  Chest radiograph dated 10/07/2021. FINDINGS: Right-sided Port-A-Cath with tip at the cavoatrial junction. There is a small left pleural effusion and left lung base atelectasis or infiltrate. Overall no significant interval change since the prior radiograph. The right lung is clear. No pneumothorax. Stable cardiac silhouette. No acute osseous pathology. Multiple surgical clips over the right chest wall. IMPRESSION: Small left pleural effusion and left lung base atelectasis or infiltrate. Electronically Signed   By: Anner Crete M.D.   On: 10/17/2021 20:24   ? ?Procedures ?Procedures  ? ? ?Medications Ordered in ED ?Medications  ?lactated ringers infusion (has no administration in time range)  ?lactated ringers bolus 1,000 mL (1,000 mLs Intravenous New Bag/Given 10/17/21 2032)  ?ceFEPIme (MAXIPIME) 2 g in sodium chloride 0.9 % 100 mL IVPB (2 g Intravenous New Bag/Given 10/17/21 2029)  ?vancomycin (VANCOREADY) IVPB 1250 mg/250 mL (has no administration in time range)  ? ? ?ED Course/ Medical Decision Making/ A&P ?  ?                        ?Medical Decision Making ?Amount and/or Complexity of Data Reviewed ?Labs: ordered. ?Radiology:  ordered. ?ECG/medicine tests: ordered. ? ?Risk ?Prescription drug management. ? ? ?Sherry Barry is here with fever.  History of breast cancer now with recurrence and lung metastasis.  Patient had temperature of 100.8 at home and called cancer center was told to come for evaluation.  Temperature is 99.8 here.  She has not taken any Tylenol.  Mildly tachycardic upon arrival.  Otherwise vitals are normal.  She noticed a small rash on  her left hand but this does not appear to be infectious.  She had a port placed last week as well as her first chemotherapy infusion last week.  She denies any abdominal pain, nausea, vomiting, pain with urination.  Overall will initiate sepsis work-up to evaluate for neutropenic fever.  Will check COVID test, blood cultures, urine studies, chest x-ray.  We will give broad-spectrum IV antibiotics.  We will give IV fluids. ? ?Per my review and interpretation of chest x-ray there is a small left-sided pleural effusion.  Fairly similar to x-ray last week.  She is not fairly symptomatic from this.  Denies any cough or sputum production.  Do not think she has pneumonia.  White count is 69.  On May 1 she was given stimulating factor shot which likely accounts for this.  Lactic acid is normal.  There is no significant anemia, electrolyte abnormality, kidney injury.  Awaiting urinalysis and COVID test.  Overall reassured that she is not neutropenic.  Talked with Dr. Burr Medico with oncology.  Overall given that clinically patient appears well and she is not neutropenic can discharge her home and they will follow-up with her closely.  Blood cultures have been sent.  Will await urinalysis and if that is positive they are okay with outpatient antibiotics for this.  If COVID is positive can consider antiviral treatment.  Overall discussed these findings with the patient and she is feeling well and is agreeable to this plan.  Patient was handed off to oncoming ED staff with patient pending  urinalysis, COVID testing. ? ?This chart was dictated using voice recognition software.  Despite best efforts to proofread,  errors can occur which can change the documentation meaning.  ? ? ? ? ? ? ? ?Final Clinical Impression(s) / ED D

## 2021-10-17 NOTE — ED Triage Notes (Signed)
Pt has history of breast cancer.  Started chemo on 4/28.  Reports rash to L hand and fever that she noticed 1 hour ago. ?

## 2021-10-19 LAB — URINE CULTURE: Culture: NO GROWTH

## 2021-10-19 LAB — PATHOLOGIST SMEAR REVIEW

## 2021-10-22 ENCOUNTER — Inpatient Hospital Stay: Payer: Federal, State, Local not specified - PPO | Admitting: Hematology and Oncology

## 2021-10-22 ENCOUNTER — Other Ambulatory Visit: Payer: Self-pay

## 2021-10-22 ENCOUNTER — Inpatient Hospital Stay: Payer: Federal, State, Local not specified - PPO

## 2021-10-22 VITALS — BP 138/109 | HR 88 | Temp 98.7°F | Resp 18

## 2021-10-22 DIAGNOSIS — J910 Malignant pleural effusion: Secondary | ICD-10-CM | POA: Diagnosis not present

## 2021-10-22 DIAGNOSIS — K521 Toxic gastroenteritis and colitis: Secondary | ICD-10-CM | POA: Diagnosis not present

## 2021-10-22 DIAGNOSIS — T451X5A Adverse effect of antineoplastic and immunosuppressive drugs, initial encounter: Secondary | ICD-10-CM | POA: Diagnosis not present

## 2021-10-22 DIAGNOSIS — R000 Tachycardia, unspecified: Secondary | ICD-10-CM | POA: Diagnosis not present

## 2021-10-22 DIAGNOSIS — Z170 Estrogen receptor positive status [ER+]: Secondary | ICD-10-CM | POA: Diagnosis not present

## 2021-10-22 DIAGNOSIS — Z5112 Encounter for antineoplastic immunotherapy: Secondary | ICD-10-CM | POA: Diagnosis not present

## 2021-10-22 DIAGNOSIS — Z5111 Encounter for antineoplastic chemotherapy: Secondary | ICD-10-CM | POA: Diagnosis not present

## 2021-10-22 DIAGNOSIS — Z923 Personal history of irradiation: Secondary | ICD-10-CM | POA: Diagnosis not present

## 2021-10-22 DIAGNOSIS — Z79810 Long term (current) use of selective estrogen receptor modulators (SERMs): Secondary | ICD-10-CM | POA: Diagnosis not present

## 2021-10-22 DIAGNOSIS — Z5189 Encounter for other specified aftercare: Secondary | ICD-10-CM | POA: Diagnosis not present

## 2021-10-22 DIAGNOSIS — R5383 Other fatigue: Secondary | ICD-10-CM | POA: Diagnosis not present

## 2021-10-22 DIAGNOSIS — C50211 Malignant neoplasm of upper-inner quadrant of right female breast: Secondary | ICD-10-CM | POA: Diagnosis not present

## 2021-10-22 DIAGNOSIS — Z9221 Personal history of antineoplastic chemotherapy: Secondary | ICD-10-CM | POA: Diagnosis not present

## 2021-10-22 LAB — CULTURE, BLOOD (ROUTINE X 2)
Culture: NO GROWTH
Culture: NO GROWTH
Special Requests: ADEQUATE
Special Requests: ADEQUATE

## 2021-10-22 MED ORDER — GOSERELIN ACETATE 3.6 MG ~~LOC~~ IMPL
3.6000 mg | DRUG_IMPLANT | Freq: Once | SUBCUTANEOUS | Status: AC
  Administered 2021-10-22: 3.6 mg via SUBCUTANEOUS
  Filled 2021-10-22: qty 3.6

## 2021-10-23 ENCOUNTER — Telehealth: Payer: Self-pay

## 2021-10-23 NOTE — Telephone Encounter (Signed)
Notified Patient of completion of FMLA forms. Fax transmission confirmation received. Copy of forms sent to Patient as requested via e-mail address on file. No other needs or concerns voiced at this time.  ?

## 2021-10-29 ENCOUNTER — Telehealth: Payer: Self-pay

## 2021-10-29 ENCOUNTER — Inpatient Hospital Stay (HOSPITAL_BASED_OUTPATIENT_CLINIC_OR_DEPARTMENT_OTHER): Payer: Federal, State, Local not specified - PPO | Admitting: Adult Health

## 2021-10-29 ENCOUNTER — Other Ambulatory Visit (HOSPITAL_COMMUNITY): Payer: Federal, State, Local not specified - PPO

## 2021-10-29 ENCOUNTER — Ambulatory Visit (HOSPITAL_COMMUNITY)
Admission: RE | Admit: 2021-10-29 | Discharge: 2021-10-29 | Disposition: A | Discharge status: Home / Self Care | Payer: Federal, State, Local not specified - PPO | Source: Ambulatory Visit | Attending: Adult Health | Admitting: Adult Health

## 2021-10-29 ENCOUNTER — Inpatient Hospital Stay: Payer: Federal, State, Local not specified - PPO

## 2021-10-29 ENCOUNTER — Encounter: Payer: Self-pay | Admitting: Adult Health

## 2021-10-29 VITALS — BP 158/110 | HR 98 | Temp 98.0°F | Resp 20 | Ht 62.0 in | Wt 147.8 lb

## 2021-10-29 DIAGNOSIS — C50211 Malignant neoplasm of upper-inner quadrant of right female breast: Secondary | ICD-10-CM | POA: Insufficient documentation

## 2021-10-29 DIAGNOSIS — Z5112 Encounter for antineoplastic immunotherapy: Secondary | ICD-10-CM | POA: Diagnosis not present

## 2021-10-29 DIAGNOSIS — J910 Malignant pleural effusion: Secondary | ICD-10-CM | POA: Diagnosis not present

## 2021-10-29 DIAGNOSIS — Z923 Personal history of irradiation: Secondary | ICD-10-CM | POA: Diagnosis not present

## 2021-10-29 DIAGNOSIS — R000 Tachycardia, unspecified: Secondary | ICD-10-CM | POA: Diagnosis not present

## 2021-10-29 DIAGNOSIS — Z170 Estrogen receptor positive status [ER+]: Secondary | ICD-10-CM | POA: Insufficient documentation

## 2021-10-29 DIAGNOSIS — I10 Essential (primary) hypertension: Secondary | ICD-10-CM | POA: Diagnosis not present

## 2021-10-29 DIAGNOSIS — K521 Toxic gastroenteritis and colitis: Secondary | ICD-10-CM | POA: Diagnosis not present

## 2021-10-29 DIAGNOSIS — Z5111 Encounter for antineoplastic chemotherapy: Secondary | ICD-10-CM | POA: Diagnosis not present

## 2021-10-29 DIAGNOSIS — R5383 Other fatigue: Secondary | ICD-10-CM | POA: Diagnosis not present

## 2021-10-29 DIAGNOSIS — Z9221 Personal history of antineoplastic chemotherapy: Secondary | ICD-10-CM | POA: Diagnosis not present

## 2021-10-29 DIAGNOSIS — C50919 Malignant neoplasm of unspecified site of unspecified female breast: Secondary | ICD-10-CM

## 2021-10-29 DIAGNOSIS — J90 Pleural effusion, not elsewhere classified: Secondary | ICD-10-CM | POA: Diagnosis not present

## 2021-10-29 DIAGNOSIS — Z79810 Long term (current) use of selective estrogen receptor modulators (SERMs): Secondary | ICD-10-CM | POA: Diagnosis not present

## 2021-10-29 DIAGNOSIS — R0602 Shortness of breath: Secondary | ICD-10-CM | POA: Diagnosis not present

## 2021-10-29 DIAGNOSIS — T451X5A Adverse effect of antineoplastic and immunosuppressive drugs, initial encounter: Secondary | ICD-10-CM | POA: Diagnosis not present

## 2021-10-29 DIAGNOSIS — Z5189 Encounter for other specified aftercare: Secondary | ICD-10-CM | POA: Diagnosis not present

## 2021-10-29 DIAGNOSIS — J9811 Atelectasis: Secondary | ICD-10-CM | POA: Diagnosis not present

## 2021-10-29 LAB — COMPREHENSIVE METABOLIC PANEL
ALT: 19 U/L (ref 0–44)
AST: 18 U/L (ref 15–41)
Albumin: 3.7 g/dL (ref 3.5–5.0)
Alkaline Phosphatase: 65 U/L (ref 38–126)
Anion gap: 6 (ref 5–15)
BUN: 10 mg/dL (ref 6–20)
CO2: 29 mmol/L (ref 22–32)
Calcium: 8.9 mg/dL (ref 8.9–10.3)
Chloride: 109 mmol/L (ref 98–111)
Creatinine, Ser: 0.64 mg/dL (ref 0.44–1.00)
GFR, Estimated: >60 mL/min (ref >60)
Glucose, Bld: 95 mg/dL (ref 70–99)
Potassium: 3.3 mmol/L — ABNORMAL LOW (ref 3.5–5.1)
Sodium: 144 mmol/L (ref 135–145)
Total Bilirubin: 0.4 mg/dL (ref 0.3–1.2)
Total Protein: 6.3 g/dL — ABNORMAL LOW (ref 6.5–8.1)

## 2021-10-29 LAB — CBC WITH DIFFERENTIAL/PLATELET
Abs Immature Granulocytes: 0.11 10*3/uL — ABNORMAL HIGH (ref 0.00–0.07)
Basophils Absolute: 0.1 10*3/uL (ref 0.0–0.1)
Basophils Relative: 1 %
Eosinophils Absolute: 0.3 10*3/uL (ref 0.0–0.5)
Eosinophils Relative: 3 %
HCT: 38.7 % (ref 36.0–46.0)
Hemoglobin: 13.2 g/dL (ref 12.0–15.0)
Immature Granulocytes: 1 %
Lymphocytes Relative: 17 %
Lymphs Abs: 1.5 10*3/uL (ref 0.7–4.0)
MCH: 29.5 pg (ref 26.0–34.0)
MCHC: 34.1 g/dL (ref 30.0–36.0)
MCV: 86.4 fL (ref 80.0–100.0)
Monocytes Absolute: 0.6 10*3/uL (ref 0.1–1.0)
Monocytes Relative: 6 %
Neutro Abs: 6.5 10*3/uL (ref 1.7–7.7)
Neutrophils Relative %: 72 %
Platelets: 324 10*3/uL (ref 150–400)
RBC: 4.48 MIL/uL (ref 3.87–5.11)
RDW: 13.4 % (ref 11.5–15.5)
WBC: 9 10*3/uL (ref 4.0–10.5)
nRBC: 0 % (ref 0.0–0.2)

## 2021-10-29 MED ORDER — AMLODIPINE BESYLATE 10 MG PO TABS: 10.0000 mg | ORAL_TABLET | Freq: Every morning | ORAL | 3 refills | Status: DC

## 2021-10-29 NOTE — Telephone Encounter (Signed)
-----   Message from Gardenia Phlegm, NP sent at 10/29/2021  2:53 PM EDT ----- Please let patient know that the chest x-ray looks okay and that she can do the small exercises like we talked about in the appointment. ----- Message ----- From: Interface, Rad Results In Sent: 10/29/2021  12:14 PM EDT To: Gardenia Phlegm, NP

## 2021-10-29 NOTE — Assessment & Plan Note (Signed)
Patient hypertension is not at goal.  I increased her amlodipine to 10 mg.  Due to the fact that she is also receiving Herceptin therapy I am going to place a referral to Dr. Harl Bowie in our cardio oncology group for further evaluation and management.

## 2021-10-29 NOTE — Progress Notes (Signed)
[facility] Follow up:    Sherry Congress, NP [place] [address]   DIAGNOSIS:  Cancer Staging  Malignant neoplasm of upper-inner quadrant of right breast in female, estrogen receptor positive ([place]) Staging form: Breast, AJCC 7th Edition - Clinical: Stage IA (T1c, N0, cM0) - Unsigned Specimen type: Core Needle Biopsy Histopathologic type: 9931 Laterality: Right Staging comments: Staged at breast conference 1.15.14  - Pathologic stage from 07/14/2012: Stage IV ([place], NX, M1) - Signed by [name], MD on 08/28/2021 Specimen type: Core Needle Biopsy Histopathologic type: 9931 Laterality: Right   SUMMARY OF ONCOLOGIC HISTORY: Oncology History  Malignant neoplasm of upper-inner quadrant of right breast in female, estrogen receptor positive ([place])  07/13/2012 Clinical Stage   Stage IA: T1c N0    07/14/2012 Definitive Surgery   Bilateral mastectomy/right SLNB: RIGHT invasive ductal carcinoma, grade 3, ER+, PR +, Her2/neu positive (ratio 3.02), Ki67 48%. DCIS. 1/4 LN positive for malignancy. LEFT: benign    07/14/2012 Pathologic Stage   Stage IIB: T2 N1a M0    07/14/2012 Cancer Staging   Staging form: Breast, AJCC 7th Edition - Pathologic stage from 07/14/2012: Stage IV ([place], NX, M1) - Signed by [name], MD on 08/28/2021 Specimen type: Core Needle Biopsy Histopathologic type: 9931 Laterality: Right    08/17/2012 - 08/30/2013 Chemotherapy   Adjuvant carboplatin, docetaxel, and trastuzumab x 6 cycles (completed 11/30/2012) followed by maintenance trastuzumab to total one year    11/2012 Procedure   Comp Cancer Gene panel ([facility]) negative for deleterious mutations     - 02/2013 Radiation Therapy   Adjuvant RT to right breast    02/2013 -  Anti-estrogen oral therapy   Tamoxifen 20 mg daily    09/24/2013 Surgery   Bilateral breast reconstruction with latissmus flap and expander placement    12/12/2013 Surgery   Implant  placement     Relapse/Recurrence   Left sided malignant pleural effusion.  Tumor cells are positive for GATA3 and ER, negative for TTF-1 consistent with recurrent breast carcinoma Prognostic showed ER 90% positive strong staining, PR 80% positive strong staining, HER2 negative.    09/16/2021 Imaging   PET scan showed malignant left pleural effusion with extensive areas of nodularity and hypermetabolic activity involving the mediastinal border and pericardium as well as the most inferior aspects of the costodiaphragmatic recess.  Signs of nodal disease at the thoracic inlet on the left suspect extension of disease below the diaphragm along the left anterolateral aorta.  Nonspecific moderate to markedly increased metabolic activity about the base of the tongue bilaterally.  Consider direct visualization showing mildly asymmetric uptake favoring the right lingual tonsil.  Sclerotic foci without marked increased metabolic activity suspicious for metastatic disease perhaps treated or questions.  Heterogeneous marrow uptake seen elsewhere generalized and nonspecific.  Consider spinal MRI is warranted  MRI brain without any evidence of intracranial metastatic disease.   10/09/2021 -  Chemotherapy   Patient is on Treatment Plan : BREAST DOCEtaxel + Trastuzumab + Pertuzumab (THP) q21d x 8 cycles / Trastuzumab + Pertuzumab q21d x 4 cycles        CURRENT THERAPY: THP  INTERVAL HISTORY: Sherry Barry 51 y.o. female returns for follow-up after receiving her first cycle of THP, she is due for her second cycle tomorrow.  She tells me she had some difficulty with this due to some diarrhea, elevated heart rate, and pain.  She has a history of malignant pleural effusion and has undergone thoracentesis for  this in the past.  She notes that she has some shortness of breath on exertion.  This is improved somewhat however the fact that it is still there is concerning to her.  Sherry Barry denies any chest pain or  palpitations.  She has had no fevers or chills.  She denies any peripheral neuropathy.  Otherwise she is doing moderately well.  Her blood pressure has been elevated even at home.  She says this began after changing from lisinopril to amlodipine.  She has been on the amlodipine 5 mg a day.  She notes that her blood pressures at home are similar to what we got in the clinic today.   Patient Active Problem List   Diagnosis Date Noted   Chemotherapy induced diarrhea 10/16/2021   Malignant pleural effusion 10/16/2021   CAP (community acquired pneumonia) 08/10/2021   Breast asymmetry following reconstructive surgery 09/09/2015   Status post bilateral breast reconstruction 12/20/2013   Acquired absence of bilateral breasts and nipples 09/24/2013   History of breast cancer 08/17/2013   Anxiety 06/28/2013   Eczema 06/28/2013   Malignant neoplasm of upper-inner quadrant of right breast in female, estrogen receptor positive ([place]) 06/20/2012   Essential hypertension 05/02/2012   Migraine 04/17/2012    is allergic to codeine, hydrocodone, latex, lisinopril, and tomato.  MEDICAL HISTORY: Past Medical History:  Diagnosis Date   Allergy    Breast cancer ([place])    Breast cancer ([place])    right   History of chemotherapy    doxetaxel/carboplatin/trastuzumab   History of migraine    last one about a week ago   Hx of radiation therapy 01/01/13- 02/15/13   r chest wall, r supraclav/axilla 5040 cGy/28 sessions, scar boost 1000 cGy/5 sessions   Hypertension    no meds,   urgent care on [place]ana   Migraine    Migraine     SURGICAL HISTORY: Past Surgical History:  Procedure Laterality Date   ABDOMINAL HYSTERECTOMY     no salpingo-oophorectomy 2009   BREAST RECONSTRUCTION WITH PLACEMENT OF TISSUE EXPANDER AND FLEX HD (ACELLULAR HYDRATED DERMIS) Left 09/24/2013   IR IMAGING GUIDED PORT INSERTION  10/07/2021   IR THORACENTESIS ASP PLEURAL SPACE W/IMG GUIDE  08/11/2021   IR THORACENTESIS ASP PLEURAL SPACE  W/IMG GUIDE  10/07/2021   LATISSIMUS FLAP TO BREAST Right 09/24/2013   Procedure: RIGHT LATISSMUS MYOCUTAEIOUS MUSCLE FLAP AND PLACEMENT OF TISSUE [name];  Surgeon: Theodoro Kos, DO;  Location: [facility];  Service: Plastics;  Laterality: Right;   LIPOSUCTION WITH LIPOFILLING Bilateral 12/12/2013   Procedure: LIPOSUCTION WITH LIPOFILLING;  Surgeon: Theodoro Kos, DO;  Location: [facility];  Service: Plastics;  Laterality: Bilateral;   PORT-A-CATH REMOVAL Left 12/12/2013   Procedure: REMOVAL PORT-A-CATH;  Surgeon: Theodoro Kos, DO;  Location: [facility];  Service: Plastics;  Laterality: Left;   PORTACATH PLACEMENT  07/14/2012   Procedure: INSERTION PORT-A-CATH;  Surgeon: Joyice Faster. Cornett, MD;  Location: [facility];  Service: General;  Laterality: Left;   RECONSTRUCTION BREAST W/ LATISSIMUS DORSI FLAP Right 09/24/2013   "& tissue expander placement"   REMOVAL OF BILATERAL TISSUE EXPANDERS WITH PLACEMENT OF BILATERAL BREAST IMPLANTS Bilateral 12/12/2013   Procedure: REMOVAL OF BILATERAL TISSUE EXPANDERS WITH PLACEMENT OF BILATERAL BREAST IMPLANTS/BILATERAL CAPSULECTOMIES WITH  LIPOFILLING FAT GRAFTING;  Surgeon: Theodoro Kos, DO;  Location: [facility];  Service: Plastics;  Laterality: Bilateral;   SIMPLE MASTECTOMY WITH AXILLARY SENTINEL NODE BIOPSY  07/14/2012   Procedure: SIMPLE MASTECTOMY WITH AXILLARY SENTINEL NODE  BIOPSY;  Surgeon: Joyice Faster. Cornett, MD;  Location: [facility];  Service: General;  Laterality: Right;  Bilateral simple mastectomy with port and right sebtibel lymph node mapping   SIMPLE MASTECTOMY WITH AXILLARY SENTINEL NODE BIOPSY  07/14/2012   Procedure: SIMPLE MASTECTOMY;  Surgeon: Joyice Faster. Cornett, MD;  Location: [facility];  Service: General;  Laterality: Left;   TISSUE EXPANDER PLACEMENT Left 09/24/2013   Procedure: PLACEMENT OF TISSUE EXPANDER AND FLEX HD TO LEFT BREAST;  Surgeon: Theodoro Kos, DO;  Location: [facility];  Service: Plastics;  Laterality: Left;     SOCIAL HISTORY: Social History   Socioeconomic History   Marital status: Married    Spouse name: Not on file   Number of children: 2   Years of education: Not on file   Highest education level: Not on file  Occupational History    Employer: [employer]  Tobacco Use   Smoking status: Never   Smokeless tobacco: Never  Substance and Sexual Activity   Alcohol use: Yes    Comment: occasional   Drug use: No   Sexual activity: Yes    Birth control/protection: Surgical  Other Topics Concern   Not on file  Social History Narrative   Not on file   Social Determinants of Health   Financial Resource Strain: Not on file  Food Insecurity: Not on file  Transportation Needs: Not on file  Physical Activity: Not on file  Stress: Not on file  Social Connections: Not on file  Intimate Partner Violence: Not on file    FAMILY HISTORY: Family History  Problem Relation Age of Onset   Hypertension Mother    Alcohol abuse Mother    Heart disease Maternal Grandmother    Stroke Maternal Grandfather    Colon cancer Maternal Aunt 84       alive at 64   Brain cancer Maternal Uncle 34       and lymphoma in early 57s; deceased   Brain cancer Maternal Uncle 60       deceased   Pancreatic cancer Maternal Uncle 49       alive at 24   Breast cancer Cousin 56       mat 1st cousin once removed through mat GF ; deceased   Breast cancer Maternal Aunt        great aunt through mat GF; dx at ? age    Review of Systems  Constitutional:  Negative for appetite change, chills, fatigue, fever and unexpected weight change.  HENT:   Negative for hearing loss, lump/mass and trouble swallowing.   Eyes:  Negative for eye problems and icterus.  Respiratory:  Positive for shortness of breath. Negative for chest tightness and cough.   Cardiovascular:  Negative for chest pain, leg swelling and palpitations.  Gastrointestinal:  Negative for abdominal distention, abdominal pain, constipation, diarrhea,  nausea and vomiting.  Endocrine: Negative for hot flashes.  Genitourinary:  Negative for difficulty urinating.   Musculoskeletal:  Negative for arthralgias.  Skin:  Negative for itching and rash.  Neurological:  Negative for dizziness, extremity weakness, headaches and numbness.  Hematological:  Negative for adenopathy. Does not bruise/bleed easily.  Psychiatric/Behavioral:  Negative for depression. The patient is not nervous/anxious.      PHYSICAL EXAMINATION  ECOG PERFORMANCE STATUS: 1 - Symptomatic but completely ambulatory  Vitals:   10/29/21 1047 10/29/21 1058  BP: (!) 151/101 (!) 158/110  Pulse: (!) 111 98  Resp: 18 20  Temp: 98 F (36.7 C)  SpO2: 100%     Physical Exam Constitutional:      General: She is not in acute distress.    Appearance: Normal appearance. She is not toxic-appearing.  HENT:     Head: Normocephalic and atraumatic.  Eyes:     General: No scleral icterus. Cardiovascular:     Rate and Rhythm: Normal rate and regular rhythm.     Pulses: Normal pulses.     Heart sounds: Normal heart sounds.  Pulmonary:     Effort: Pulmonary effort is normal.     Breath sounds: Normal breath sounds.  Abdominal:     General: Abdomen is flat. Bowel sounds are normal. There is no distension.     Palpations: Abdomen is soft.     Tenderness: There is no abdominal tenderness.  Musculoskeletal:        General: No swelling.     Cervical back: Neck supple.  Lymphadenopathy:     Cervical: No cervical adenopathy.  Skin:    General: Skin is warm and dry.     Findings: No rash.  Neurological:     General: No focal deficit present.     Mental Status: She is alert.  Psychiatric:        Mood and Affect: Mood normal.        Behavior: Behavior normal.    LABORATORY DATA:  CBC    Component Value Date/Time   WBC 9.0 10/29/2021 1039   RBC 4.48 10/29/2021 1039   HGB 13.2 10/29/2021 1039   HGB 15.9 10/30/2019 1030   HGB 14.7 02/15/2017 0852   HCT 38.7 10/29/2021  1039   HCT 46.1 10/30/2019 1030   HCT 42.9 02/15/2017 0852   PLT 324 10/29/2021 1039   PLT 245 10/30/2019 1030   MCV 86.4 10/29/2021 1039   MCV 88 10/30/2019 1030   MCV 86.7 02/15/2017 0852   MCH 29.5 10/29/2021 1039   MCHC 34.1 10/29/2021 1039   RDW 13.4 10/29/2021 1039   RDW 12.5 10/30/2019 1030   RDW 12.6 02/15/2017 0852   LYMPHSABS 1.5 10/29/2021 1039   LYMPHSABS 1.4 02/15/2017 0852   MONOABS 0.6 10/29/2021 1039   MONOABS 0.3 02/15/2017 0852   EOSABS 0.3 10/29/2021 1039   EOSABS 0.1 02/15/2017 0852   BASOSABS 0.1 10/29/2021 1039   BASOSABS 0.0 02/15/2017 0852    CMP     Component Value Date/Time   NA 144 10/29/2021 1039   NA 140 10/30/2019 1030   NA 141 02/15/2017 0852   K 3.3 (L) 10/29/2021 1039   K 3.9 02/15/2017 0852   CL 109 10/29/2021 1039   CL 101 11/15/2012 0904   CO2 29 10/29/2021 1039   CO2 25 02/15/2017 0852   GLUCOSE 95 10/29/2021 1039   GLUCOSE 87 02/15/2017 0852   GLUCOSE 69 (L) 11/15/2012 0904   BUN 10 10/29/2021 1039   BUN 11 10/30/2019 1030   BUN 12.5 02/15/2017 0852   CREATININE 0.64 10/29/2021 1039   CREATININE 0.9 02/15/2017 0852   CALCIUM 8.9 10/29/2021 1039   CALCIUM 9.3 02/15/2017 0852   PROT 6.3 (L) 10/29/2021 1039   PROT 6.7 10/30/2019 1030   PROT 7.0 02/15/2017 0852   ALBUMIN 3.7 10/29/2021 1039   ALBUMIN 4.2 10/30/2019 1030   ALBUMIN 3.6 02/15/2017 0852   AST 18 10/29/2021 1039   AST 16 02/15/2017 0852   ALT 19 10/29/2021 1039   ALT 13 02/15/2017 0852   ALKPHOS 65 10/29/2021 1039   ALKPHOS 63 02/15/2017 0852  BILITOT 0.4 10/29/2021 1039   BILITOT 0.5 10/30/2019 1030   BILITOT 0.69 02/15/2017 0852   GFRNONAA >60 10/29/2021 1039   GFRAA 100 10/30/2019 1030     ASSESSMENT and THERAPY PLAN:   Malignant neoplasm of upper-inner quadrant of right breast in female, estrogen receptor positive ([place]) Sherry Barry is a 51 year old woman with stage IV triple positive breast cancer currently on treatment with Taxotere Herceptin and Perjeta.   She is tolerating treatment moderately well.  She will continue to receive this.  I reassured her that usually the first infusion is the most difficult and has the body adjusted and she understands what side effects to expect they become easier.  She does have a history of pleural effusion and shortness of breath.  We will repeat chest x-ray today.  Sometimes this can be due to deconditioning however we do need to get to the bottom of it.  Sherry Barry will receive her treatment tomorrow and then we will see her back in 3 weeks for labs, follow-up, cycle 3 of treatment.  Essential hypertension Patient hypertension is not at goal.  I increased her amlodipine to 10 mg.  Due to the fact that she is also receiving Herceptin therapy I am going to place a referral to Dr. Harl Bowie in our cardio oncology group for further evaluation and management.   All questions were answered. The patient knows to call the clinic with any problems, questions or concerns. We can certainly see the patient much sooner if necessary.  Total encounter time:30 minutes*in face-to-face visit time, chart review, lab review, care coordination, order entry, and documentation of the encounter time.    Wilber Bihari, NP 10/29/21 11:55 AM Medical Oncology and Hematology [facility] [place], [place] [address] Tel. [phone]    Fax. [phone]  *Total Encounter Time as defined by the Centers for Medicare and Medicaid Services includes, in addition to the face-to-face time of a patient visit (documented in the note above) non-face-to-face time: obtaining and reviewing outside history, ordering and reviewing medications, tests or procedures, care coordination (communications with other health care professionals or caregivers) and documentation in the medical record.

## 2021-10-29 NOTE — Telephone Encounter (Signed)
Called pt per NP and gave message. Pt verbalized thanks and understanding. Knows to call with any concerns.

## 2021-10-29 NOTE — Assessment & Plan Note (Addendum)
Sherry Barry is a 51 year old woman with stage IV triple positive breast cancer currently on treatment with Taxotere Herceptin and Perjeta.  She is tolerating treatment moderately well.  She will continue to receive this.  I reassured her that usually the first infusion is the most difficult and has the body adjusted and she understands what side effects to expect they become easier.  She does have a history of pleural effusion and shortness of breath.  We will repeat chest x-ray today.  Sometimes this can be due to deconditioning however we do need to get to the bottom of it.  Sherry Barry will receive her treatment tomorrow and then we will see her back in 3 weeks for labs, follow-up, cycle 3 of treatment.

## 2021-10-30 ENCOUNTER — Other Ambulatory Visit: Payer: Self-pay | Admitting: Hematology and Oncology

## 2021-10-30 ENCOUNTER — Other Ambulatory Visit: Payer: Self-pay

## 2021-10-30 ENCOUNTER — Other Ambulatory Visit: Payer: Self-pay | Admitting: *Deleted

## 2021-10-30 ENCOUNTER — Inpatient Hospital Stay: Payer: Federal, State, Local not specified - PPO

## 2021-10-30 ENCOUNTER — Ambulatory Visit (HOSPITAL_COMMUNITY)
Admission: RE | Admit: 2021-10-30 | Discharge: 2021-10-30 | Disposition: A | Discharge status: Home / Self Care | Payer: Federal, State, Local not specified - PPO | Source: Ambulatory Visit | Attending: Hematology and Oncology | Admitting: Hematology and Oncology

## 2021-10-30 ENCOUNTER — Ambulatory Visit (HOSPITAL_COMMUNITY)
Admission: RE | Admit: 2021-10-30 | Discharge: 2021-10-30 | Disposition: A | Discharge status: Home / Self Care | Payer: Federal, State, Local not specified - PPO | Source: Ambulatory Visit | Attending: Radiology | Admitting: Radiology

## 2021-10-30 VITALS — BP 134/99 | HR 124 | Temp 98.3°F | Resp 18

## 2021-10-30 VITALS — BP 146/112

## 2021-10-30 DIAGNOSIS — Z170 Estrogen receptor positive status [ER+]: Secondary | ICD-10-CM

## 2021-10-30 DIAGNOSIS — J90 Pleural effusion, not elsewhere classified: Secondary | ICD-10-CM | POA: Diagnosis not present

## 2021-10-30 DIAGNOSIS — J910 Malignant pleural effusion: Secondary | ICD-10-CM

## 2021-10-30 DIAGNOSIS — C50919 Malignant neoplasm of unspecified site of unspecified female breast: Secondary | ICD-10-CM | POA: Diagnosis not present

## 2021-10-30 DIAGNOSIS — C50211 Malignant neoplasm of upper-inner quadrant of right female breast: Secondary | ICD-10-CM | POA: Diagnosis not present

## 2021-10-30 DIAGNOSIS — J9811 Atelectasis: Secondary | ICD-10-CM | POA: Diagnosis not present

## 2021-10-30 DIAGNOSIS — Z853 Personal history of malignant neoplasm of breast: Secondary | ICD-10-CM | POA: Diagnosis not present

## 2021-10-30 MED ORDER — SODIUM CHLORIDE 0.9 % IV SOLN
420.0000 mg | Freq: Once | INTRAVENOUS | Status: AC
  Administered 2021-10-30: 420 mg via INTRAVENOUS
  Filled 2021-10-30: qty 14

## 2021-10-30 MED ORDER — SODIUM CHLORIDE 0.9 % IV SOLN: Freq: Once | INTRAVENOUS | Status: AC

## 2021-10-30 MED ORDER — LIDOCAINE HCL 1 % IJ SOLN
INTRAMUSCULAR | Status: AC
Start: 2021-10-30 — End: 2021-10-30
  Administered 2021-10-30: 15 mL
  Filled 2021-10-30: qty 20

## 2021-10-30 MED ORDER — SODIUM CHLORIDE 0.9 % IV SOLN
10.0000 mg | Freq: Once | INTRAVENOUS | Status: AC
  Administered 2021-10-30: 10 mg via INTRAVENOUS
  Filled 2021-10-30: qty 10

## 2021-10-30 MED ORDER — DIPHENHYDRAMINE HCL 25 MG PO CAPS
50.0000 mg | ORAL_CAPSULE | Freq: Once | ORAL | Status: AC
  Administered 2021-10-30: 50 mg via ORAL
  Filled 2021-10-30: qty 2

## 2021-10-30 MED ORDER — SODIUM CHLORIDE 0.9 % IV SOLN
75.0000 mg/m2 | Freq: Once | INTRAVENOUS | Status: AC
  Administered 2021-10-30: 130 mg via INTRAVENOUS
  Filled 2021-10-30: qty 13

## 2021-10-30 MED ORDER — ACETAMINOPHEN 325 MG PO TABS
650.0000 mg | ORAL_TABLET | Freq: Once | ORAL | Status: AC
  Administered 2021-10-30: 650 mg via ORAL
  Filled 2021-10-30: qty 2

## 2021-10-30 MED ORDER — HEPARIN SOD (PORK) LOCK FLUSH 100 UNIT/ML IV SOLN
500.0000 [IU] | Freq: Once | INTRAVENOUS | Status: AC | PRN
  Administered 2021-10-30: 500 [IU]

## 2021-10-30 MED ORDER — TRASTUZUMAB-DKST CHEMO 150 MG IV SOLR
6.0000 mg/kg | Freq: Once | INTRAVENOUS | Status: AC
  Administered 2021-10-30: 399 mg via INTRAVENOUS
  Filled 2021-10-30: qty 19

## 2021-10-30 MED ORDER — SODIUM CHLORIDE 0.9% FLUSH
10.0000 mL | INTRAVENOUS | Status: DC | PRN
  Administered 2021-10-30: 10 mL

## 2021-10-30 NOTE — Patient Instructions (Signed)
[facility]  Discharge Instructions: Thank you for choosing [facility] to provide your oncology and hematology care.   If you have a lab appointment with the [facility], please go directly to the [facility] and check in at the registration area.   Wear comfortable clothing and clothing appropriate for easy access to any Portacath or PICC line.   We strive to give you quality time with your provider. You may need to reschedule your appointment if you arrive late (15 or more minutes).  Arriving late affects you and other patients whose appointments are after yours.  Also, if you miss three or more appointments without notifying the office, you may be dismissed from the clinic at the provider's discretion.      For prescription refill requests, have your pharmacy contact our office and allow 72 hours for refills to be completed.    Today you received the following chemotherapy and/or immunotherapy agents: Trastuzumab, Docetaxel, Pertuzumab     To help prevent nausea and vomiting after your treatment, we encourage you to take your nausea medication as directed.  BELOW ARE SYMPTOMS THAT SHOULD BE REPORTED IMMEDIATELY: *FEVER GREATER THAN 100.4 F (38 C) OR HIGHER *CHILLS OR SWEATING *NAUSEA AND VOMITING THAT IS NOT CONTROLLED WITH YOUR NAUSEA MEDICATION *UNUSUAL SHORTNESS OF BREATH *UNUSUAL BRUISING OR BLEEDING *URINARY PROBLEMS (pain or burning when urinating, or frequent urination) *BOWEL PROBLEMS (unusual diarrhea, constipation, pain near the anus) TENDERNESS IN MOUTH AND THROAT WITH OR WITHOUT PRESENCE OF ULCERS (sore throat, sores in mouth, or a toothache) UNUSUAL RASH, SWELLING OR PAIN  UNUSUAL VAGINAL DISCHARGE OR ITCHING   Items with * indicate a potential emergency and should be followed up as soon as possible or go to the Emergency Department if any problems should occur.  Please show the CHEMOTHERAPY ALERT CARD or IMMUNOTHERAPY  ALERT CARD at check-in to the Emergency Department and triage nurse.  Should you have questions after your visit or need to cancel or reschedule your appointment, please contact [facility]  Dept: [phone]  and follow the prompts.  Office hours are 8:00 a.m. to 4:30 p.m. Monday - Friday. Please note that voicemails left after 4:00 p.m. may not be returned until the following business day.  We are closed weekends and major holidays. You have access to a nurse at all times for urgent questions. Please call the main number to the clinic Dept: [phone] and follow the prompts.   For any non-urgent questions, you may also contact your provider using MyChart. We now offer e-Visits for anyone 29 and older to request care online for non-urgent symptoms. For details visit [email or web address].   Also download the MyChart app! Go to the app store, search "MyChart", open the app, select [facility], and log in with your MyChart username and password.  Due to Covid, a mask is required upon entering the hospital/clinic. If you do not have a mask, one will be given to you upon arrival. For doctor visits, patients may have 1 support person aged 2 or older with them. For treatment visits, patients cannot have anyone with them due to current Covid guidelines and our immunocompromised population.

## 2021-10-30 NOTE — Procedures (Signed)
Ultrasound-guided  therapeutic left thoracentesis performed yielding 600 cc of yellow fluid. No immediate complications. Follow-up chest x-ray pending. EBL< 2 cc.

## 2021-10-30 NOTE — Progress Notes (Signed)
Ok to treat with elevated BP and HR per Dr Chryl Heck.  Will get EKG today.

## 2021-11-02 ENCOUNTER — Other Ambulatory Visit: Payer: Self-pay

## 2021-11-02 ENCOUNTER — Inpatient Hospital Stay: Payer: Federal, State, Local not specified - PPO

## 2021-11-02 VITALS — BP 148/106 | HR 92 | Temp 98.8°F | Resp 18

## 2021-11-02 DIAGNOSIS — C50211 Malignant neoplasm of upper-inner quadrant of right female breast: Secondary | ICD-10-CM | POA: Diagnosis not present

## 2021-11-02 DIAGNOSIS — R5383 Other fatigue: Secondary | ICD-10-CM | POA: Diagnosis not present

## 2021-11-02 DIAGNOSIS — J910 Malignant pleural effusion: Secondary | ICD-10-CM | POA: Diagnosis not present

## 2021-11-02 DIAGNOSIS — Z79810 Long term (current) use of selective estrogen receptor modulators (SERMs): Secondary | ICD-10-CM | POA: Diagnosis not present

## 2021-11-02 DIAGNOSIS — K521 Toxic gastroenteritis and colitis: Secondary | ICD-10-CM | POA: Diagnosis not present

## 2021-11-02 DIAGNOSIS — Z5112 Encounter for antineoplastic immunotherapy: Secondary | ICD-10-CM | POA: Diagnosis not present

## 2021-11-02 DIAGNOSIS — T451X5A Adverse effect of antineoplastic and immunosuppressive drugs, initial encounter: Secondary | ICD-10-CM | POA: Diagnosis not present

## 2021-11-02 DIAGNOSIS — Z923 Personal history of irradiation: Secondary | ICD-10-CM | POA: Diagnosis not present

## 2021-11-02 DIAGNOSIS — Z170 Estrogen receptor positive status [ER+]: Secondary | ICD-10-CM | POA: Diagnosis not present

## 2021-11-02 DIAGNOSIS — Z5189 Encounter for other specified aftercare: Secondary | ICD-10-CM | POA: Diagnosis not present

## 2021-11-02 DIAGNOSIS — Z9221 Personal history of antineoplastic chemotherapy: Secondary | ICD-10-CM | POA: Diagnosis not present

## 2021-11-02 DIAGNOSIS — Z5111 Encounter for antineoplastic chemotherapy: Secondary | ICD-10-CM | POA: Diagnosis not present

## 2021-11-02 DIAGNOSIS — R000 Tachycardia, unspecified: Secondary | ICD-10-CM | POA: Diagnosis not present

## 2021-11-02 MED ORDER — PEGFILGRASTIM-CBQV 6 MG/0.6ML ~~LOC~~ SOSY
6.0000 mg | PREFILLED_SYRINGE | Freq: Once | SUBCUTANEOUS | Status: AC
  Administered 2021-11-02: 6 mg via SUBCUTANEOUS
  Filled 2021-11-02: qty 0.6

## 2021-11-11 ENCOUNTER — Ambulatory Visit (HOSPITAL_COMMUNITY)
Admission: RE | Admit: 2021-11-11 | Discharge: 2021-11-11 | Disposition: A | Discharge status: Home / Self Care | Payer: Federal, State, Local not specified - PPO | Source: Ambulatory Visit | Attending: Cardiology | Admitting: Cardiology

## 2021-11-11 VITALS — BP 119/72 | HR 105 | Wt 149.2 lb

## 2021-11-11 DIAGNOSIS — I10 Essential (primary) hypertension: Secondary | ICD-10-CM | POA: Insufficient documentation

## 2021-11-11 DIAGNOSIS — Z170 Estrogen receptor positive status [ER+]: Secondary | ICD-10-CM | POA: Diagnosis not present

## 2021-11-11 DIAGNOSIS — J910 Malignant pleural effusion: Secondary | ICD-10-CM | POA: Insufficient documentation

## 2021-11-11 DIAGNOSIS — Z7969 Long term (current) use of other immunomodulators and immunosuppressants: Secondary | ICD-10-CM | POA: Insufficient documentation

## 2021-11-11 DIAGNOSIS — Z79811 Long term (current) use of aromatase inhibitors: Secondary | ICD-10-CM | POA: Insufficient documentation

## 2021-11-11 DIAGNOSIS — C50211 Malignant neoplasm of upper-inner quadrant of right female breast: Secondary | ICD-10-CM | POA: Diagnosis not present

## 2021-11-11 DIAGNOSIS — Z79899 Other long term (current) drug therapy: Secondary | ICD-10-CM | POA: Insufficient documentation

## 2021-11-11 NOTE — Progress Notes (Signed)
Oncology: Dr. Chryl Heck  52 y.o. with history of stage IV triple positive breast cancer presents for cardio-oncology evaluation.  Initial diagnosis in 1/14.  Bilateral mastectomy, ER+/PR+/HER2+.  She was treated with carboplatin/docetaxel/trastuzumab x 6 cycles then trastuzumab alone to complete 1 year.  She had radiation as well. In 4/23, patient was found to have recurrence with left malignant pleural effusion. She has started chemo with docetaxel/trastuzumab/pertuzumab, 1 cycle completed.   I reviewed her initial echo from 4/23, this showed EF 60-65%.   Patient did not have cardiac complications from her initial chemotherapy involving trastuzumab in 2014.  She does have treated HTN.  No family history of cardiomyopathy or early CAD.  Nonsmoker.  She has good functional status, no exertional dyspnea or chest pain.  She does feel like her stamina is worse recently but attributes this to not exercising as much since diagnosis of recurrence was made.   ECG (personally reviewed): NSR rate 91, normal.   Labs (5/23): K 3.3, creatinine 0.64  PMH: 1. HTN 2. Migraines 3. Breast cancer: Initial diagnosis in 1/14.  Bilateral mastectomy, ER+/PR+/HER2+.  She was treated with carboplatin/docetaxel/trastuzumab x 6 cycles then trastuzumab alone to complete 1 year.  She had radiation.  - 4/23 patient had recurrence with left malignant pleural effusion found. She has started chemo with docetaxel/trastuzumab/pertuzumab.  - Echo (4/23): EF 60-65%, GLS -16.8%, RV normal.   Social History   Socioeconomic History   Marital status: Married    Spouse name: Not on file   Number of children: 2   Years of education: Not on file   Highest education level: Not on file  Occupational History    Employer: [employer]  Tobacco Use   Smoking status: Never   Smokeless tobacco: Never  Substance and Sexual Activity   Alcohol use: Yes    Comment: occasional   Drug use: No   Sexual activity: Yes    Birth  control/protection: Surgical  Other Topics Concern   Not on file  Social History Narrative   Not on file   Social Determinants of Health   Financial Resource Strain: Not on file  Food Insecurity: Not on file  Transportation Needs: Not on file  Physical Activity: Not on file  Stress: Not on file  Social Connections: Not on file  Intimate Partner Violence: Not on file   Family History  Problem Relation Age of Onset   Hypertension Mother    Alcohol abuse Mother    Heart disease Maternal Grandmother    Stroke Maternal Grandfather    Colon cancer Maternal Aunt 11       alive at 15   Brain cancer Maternal Uncle 30       and lymphoma in early 69s; deceased   Brain cancer Maternal Uncle 60       deceased   Pancreatic cancer Maternal Uncle 54       alive at 59   Breast cancer Cousin 73       mat 1st cousin once removed through mat GF ; deceased   Breast cancer Maternal Aunt        great aunt through mat GF; dx at ? age   ROS: All systems reviewed and negative except as per HPI.   Current Outpatient Medications  Medication Sig Dispense Refill   acetaminophen (TYLENOL) 500 MG tablet Take 1,000 mg by mouth every 6 (six) hours as needed for moderate pain.     amLODipine (NORVASC) 10 MG tablet Take 1 tablet (  10 mg total) by mouth every morning. 30 tablet 3   dexamethasone (DECADRON) 4 MG tablet Take 2 tablets (8 mg total) by mouth 2 (two) times daily.  Start the day before Taxotere, then daily after chemo for 2 days 30 tablet 1   lidocaine-prilocaine (EMLA) cream Apply 1 application. topically as needed. 30 g 0   LORazepam (ATIVAN) 0.5 MG tablet Take 1 tablet (0.5 mg total) by mouth every 6 (six) hours as needed for anxiety. 30 tablet 0   ondansetron (ZOFRAN) 8 MG tablet Take 1 tablet (8 mg total) by mouth 2 (two) times daily. 20 tablet 0   prochlorperazine (COMPAZINE) 10 MG tablet Take 1 tablet (10 mg total) by mouth every 6 (six) hours as needed for nausea or vomiting. 30 tablet 0    No current facility-administered medications for this encounter.   BP 119/72   Pulse (!) 105   Wt 67.7 kg (149 lb 3.2 oz)   SpO2 98%   BMI 27.29 kg/m  General: NAD Neck: No JVD, no thyromegaly or thyroid nodule.  Lungs: Clear to auscultation bilaterally with normal respiratory effort. CV: Nondisplaced PMI.  Heart regular S1/S2, no S3/S4, no murmur.  No peripheral edema.  No carotid bruit.  Normal pedal pulses.  Abdomen: Soft, nontender, no hepatosplenomegaly, no distention.  Skin: Intact without lesions or rashes.  Neurologic: Alert and oriented x 3.  Psych: Normal affect. Extremities: No clubbing or cyanosis.  HEENT: Normal.   Assessment/Plan: 1. Stage IV triple positive breast cancer: Patient had trastuzumab as part of her therapy back in 2014 with no cardiac complications.  She has started trastuzumab-based therapy again.  I reviewed her initial pre-therapy echo, this appeared normal.  We discussed the possible cardio-toxicity of trastuzumab and the reasoning behind serial echoes.   - I will obtain repeat echo in 7/23. 2. HTN: BP controlled on current amlodipine, continue.   Loralie Champagne 11/11/2021

## 2021-11-11 NOTE — Patient Instructions (Signed)
Thank you for your visit today.  There has been no changes to your medications.  Your physician has requested that you have an echocardiogram. Echocardiography is a painless test that uses sound waves to create images of your heart. It provides your doctor with information about the size and shape of your heart and how well your heart's chambers and valves are working. This procedure takes approximately one hour. There are no restrictions for this procedure.  Your physician recommends that you schedule a follow-up appointment in: 3 months.  If you have any questions or concerns before your next appointment please send [place] a message through [place] or call our office at [phone].    TO LEAVE A MESSAGE FOR THE NURSE SELECT OPTION 2, PLEASE LEAVE A MESSAGE INCLUDING: YOUR NAME DATE OF BIRTH CALL BACK NUMBER REASON FOR CALL**this is important as we prioritize the call backs  YOU WILL RECEIVE A CALL BACK THE SAME DAY AS LONG AS YOU CALL BEFORE 4:00 PM  At the [facility], you and your health needs are our priority. As part of our continuing mission to provide you with exceptional heart care, we have created designated Provider Care Teams. These Care Teams include your primary Cardiologist (physician) and Advanced Practice Providers (APPs- Physician Assistants and Nurse Practitioners) who all work together to provide you with the care you need, when you need it.   You may see any of the following providers on your designated Care Team at your next follow up: Dr Glori Bickers Dr Haynes Kerns, NP Lyda Jester, [place] [facility] [place], [place] Audry Riles, PharmD   Please be sure to bring in all your medications bottles to every appointment.

## 2021-11-19 ENCOUNTER — Encounter: Payer: Self-pay | Admitting: Hematology and Oncology

## 2021-11-19 ENCOUNTER — Inpatient Hospital Stay: Payer: Federal, State, Local not specified - PPO | Attending: Hematology and Oncology

## 2021-11-19 ENCOUNTER — Other Ambulatory Visit: Payer: Self-pay

## 2021-11-19 ENCOUNTER — Inpatient Hospital Stay: Payer: Federal, State, Local not specified - PPO

## 2021-11-19 ENCOUNTER — Inpatient Hospital Stay (HOSPITAL_BASED_OUTPATIENT_CLINIC_OR_DEPARTMENT_OTHER): Payer: Federal, State, Local not specified - PPO | Admitting: Hematology and Oncology

## 2021-11-19 ENCOUNTER — Other Ambulatory Visit: Payer: Self-pay | Admitting: Hematology and Oncology

## 2021-11-19 DIAGNOSIS — Z170 Estrogen receptor positive status [ER+]: Secondary | ICD-10-CM | POA: Diagnosis not present

## 2021-11-19 DIAGNOSIS — Z5189 Encounter for other specified aftercare: Secondary | ICD-10-CM | POA: Diagnosis not present

## 2021-11-19 DIAGNOSIS — Z9221 Personal history of antineoplastic chemotherapy: Secondary | ICD-10-CM | POA: Diagnosis not present

## 2021-11-19 DIAGNOSIS — Z5111 Encounter for antineoplastic chemotherapy: Secondary | ICD-10-CM | POA: Diagnosis not present

## 2021-11-19 DIAGNOSIS — Z807 Family history of other malignant neoplasms of lymphoid, hematopoietic and related tissues: Secondary | ICD-10-CM | POA: Insufficient documentation

## 2021-11-19 DIAGNOSIS — C50211 Malignant neoplasm of upper-inner quadrant of right female breast: Secondary | ICD-10-CM | POA: Insufficient documentation

## 2021-11-19 DIAGNOSIS — Z808 Family history of malignant neoplasm of other organs or systems: Secondary | ICD-10-CM | POA: Diagnosis not present

## 2021-11-19 DIAGNOSIS — Z800 Family history of malignant neoplasm of digestive organs: Secondary | ICD-10-CM | POA: Diagnosis not present

## 2021-11-19 DIAGNOSIS — Z923 Personal history of irradiation: Secondary | ICD-10-CM | POA: Insufficient documentation

## 2021-11-19 DIAGNOSIS — Z9013 Acquired absence of bilateral breasts and nipples: Secondary | ICD-10-CM | POA: Diagnosis not present

## 2021-11-19 DIAGNOSIS — Z5112 Encounter for antineoplastic immunotherapy: Secondary | ICD-10-CM | POA: Diagnosis not present

## 2021-11-19 DIAGNOSIS — C50919 Malignant neoplasm of unspecified site of unspecified female breast: Secondary | ICD-10-CM

## 2021-11-19 DIAGNOSIS — Z803 Family history of malignant neoplasm of breast: Secondary | ICD-10-CM | POA: Insufficient documentation

## 2021-11-19 LAB — CBC WITH DIFFERENTIAL/PLATELET
Abs Immature Granulocytes: 0.11 10*3/uL — ABNORMAL HIGH (ref 0.00–0.07)
Basophils Absolute: 0.1 10*3/uL (ref 0.0–0.1)
Basophils Relative: 1 %
Eosinophils Absolute: 0 10*3/uL (ref 0.0–0.5)
Eosinophils Relative: 0 %
HCT: 38 % (ref 36.0–46.0)
Hemoglobin: 12.8 g/dL (ref 12.0–15.0)
Immature Granulocytes: 1 %
Lymphocytes Relative: 10 %
Lymphs Abs: 1.3 10*3/uL (ref 0.7–4.0)
MCH: 29.6 pg (ref 26.0–34.0)
MCHC: 33.7 g/dL (ref 30.0–36.0)
MCV: 88 fL (ref 80.0–100.0)
Monocytes Absolute: 0.3 10*3/uL (ref 0.1–1.0)
Monocytes Relative: 2 %
Neutro Abs: 11.2 10*3/uL — ABNORMAL HIGH (ref 1.7–7.7)
Neutrophils Relative %: 86 %
Platelets: 275 10*3/uL (ref 150–400)
RBC: 4.32 MIL/uL (ref 3.87–5.11)
RDW: 15.3 % (ref 11.5–15.5)
WBC: 12.9 10*3/uL — ABNORMAL HIGH (ref 4.0–10.5)
nRBC: 0 % (ref 0.0–0.2)

## 2021-11-19 LAB — COMPREHENSIVE METABOLIC PANEL
ALT: 26 U/L (ref 0–44)
AST: 27 U/L (ref 15–41)
Albumin: 4 g/dL (ref 3.5–5.0)
Alkaline Phosphatase: 68 U/L (ref 38–126)
Anion gap: 9 (ref 5–15)
BUN: 12 mg/dL (ref 6–20)
CO2: 27 mmol/L (ref 22–32)
Calcium: 9.4 mg/dL (ref 8.9–10.3)
Chloride: 108 mmol/L (ref 98–111)
Creatinine, Ser: 0.63 mg/dL (ref 0.44–1.00)
GFR, Estimated: >60 mL/min (ref >60)
Glucose, Bld: 97 mg/dL (ref 70–99)
Potassium: 3.1 mmol/L — ABNORMAL LOW (ref 3.5–5.1)
Sodium: 144 mmol/L (ref 135–145)
Total Bilirubin: 0.4 mg/dL (ref 0.3–1.2)
Total Protein: 6.5 g/dL (ref 6.5–8.1)

## 2021-11-19 MED ORDER — ONDANSETRON HCL 8 MG PO TABS
8.0000 mg | ORAL_TABLET | Freq: Three times a day (TID) | ORAL | 0 refills | Status: DC | PRN
Start: 2021-11-19 — End: 2022-03-15

## 2021-11-19 MED ORDER — GOSERELIN ACETATE 3.6 MG ~~LOC~~ IMPL
3.6000 mg | DRUG_IMPLANT | Freq: Once | SUBCUTANEOUS | Status: AC
  Administered 2021-11-19: 3.6 mg via SUBCUTANEOUS
  Filled 2021-11-19: qty 3.6

## 2021-11-19 MED ORDER — DEXAMETHASONE 4 MG PO TABS: ORAL_TABLET | ORAL | 1 refills | Status: DC

## 2021-11-19 MED ORDER — ONDANSETRON HCL 8 MG PO TABS
8.0000 mg | ORAL_TABLET | Freq: Two times a day (BID) | ORAL | 0 refills | Status: DC
Start: 2021-11-19 — End: 2021-11-19

## 2021-11-19 NOTE — Progress Notes (Signed)
[facility] Follow up:    Sherry Congress, NP [place] [address]   DIAGNOSIS:  Cancer Staging  Malignant neoplasm of upper-inner quadrant of right breast in female, estrogen receptor positive ([place]) Staging form: Breast, AJCC 7th Edition - Clinical: Stage IA (T1c, N0, cM0) - Unsigned Specimen type: Core Needle Biopsy Histopathologic type: 9931 Laterality: Right Staging comments: Staged at breast conference 1.15.14  - Pathologic stage from 07/14/2012: Stage IV ([place], NX, M1) - Signed by [name], MD on 08/28/2021 Specimen type: Core Needle Biopsy Histopathologic type: 9931 Laterality: Right   SUMMARY OF ONCOLOGIC HISTORY: Oncology History  Malignant neoplasm of upper-inner quadrant of right breast in female, estrogen receptor positive ([name])  07/13/2012 Clinical Stage   Stage IA: T1c N0   07/14/2012 Definitive Surgery   Bilateral mastectomy/right SLNB: RIGHT invasive ductal carcinoma, grade 3, ER+, PR +, Her2/neu positive (ratio 3.02), Ki67 48%. DCIS. 1/4 LN positive for malignancy. LEFT: benign   07/14/2012 Pathologic Stage   Stage IIB: T2 N1a M0   07/14/2012 Cancer Staging   Staging form: Breast, AJCC 7th Edition - Pathologic stage from 07/14/2012: Stage IV (TX, NX, M1) - Signed by [name], MD on 08/28/2021 Specimen type: Core Needle Biopsy Histopathologic type: 9931 Laterality: Right   08/17/2012 - 08/30/2013 Chemotherapy   Adjuvant carboplatin, docetaxel, and trastuzumab x 6 cycles (completed 11/30/2012) followed by maintenance trastuzumab to total one year   11/2012 Procedure   Comp Cancer Gene panel (GeneDx) negative for deleterious mutations    - 02/2013 Radiation Therapy   Adjuvant RT to right breast   02/2013 -  Anti-estrogen oral therapy   Tamoxifen 20 mg daily   09/24/2013 Surgery   Bilateral breast reconstruction with latissmus flap and expander placement   12/12/2013 Surgery   Implant placement     Relapse/Recurrence   Left sided malignant pleural effusion.  Tumor cells are positive for GATA3 and ER, negative for TTF-1 consistent with recurrent breast carcinoma Prognostic showed ER 90% positive strong staining, PR 80% positive strong staining, HER2 negative.    09/16/2021 Imaging   PET scan showed malignant left pleural effusion with extensive areas of nodularity and hypermetabolic activity involving the mediastinal border and pericardium as well as the most inferior aspects of the costodiaphragmatic recess.  Signs of nodal disease at the thoracic inlet on the left suspect extension of disease below the diaphragm along the left anterolateral aorta.  Nonspecific moderate to markedly increased metabolic activity about the base of the tongue bilaterally.  Consider direct visualization showing mildly asymmetric uptake favoring the right lingual tonsil.  Sclerotic foci without marked increased metabolic activity suspicious for metastatic disease perhaps treated or questions.  Heterogeneous marrow uptake seen elsewhere generalized and nonspecific.  Consider spinal MRI is warranted  MRI brain without any evidence of intracranial metastatic disease.   10/09/2021 -  Chemotherapy   Patient is on Treatment Plan : BREAST DOCEtaxel + Trastuzumab + Pertuzumab (THP) q21d x 8 cycles / Trastuzumab + Pertuzumab q21d x 4 cycles       CURRENT THERAPY: THP  INTERVAL HISTORY:  Sherry Barry 51 y.o. female returns for follow-up for cycle 3 of THP She has been feeling more fatigued, notices some muscle aches in the large muscles. SOB is better overall. She has the urge to use the bathroom right after she eats. No watery diarrhea. No fevers or chills. No tingling or numbness in the hands and feet. Some numbness in the finger tips, not  interfering with ADL Rest of the ROS reviewed and negative  Patient Active Problem List   Diagnosis Date Noted   Chemotherapy induced diarrhea 10/16/2021   Malignant  pleural effusion 10/16/2021   CAP (community acquired pneumonia) 08/10/2021   Breast asymmetry following reconstructive surgery 09/09/2015   Status post bilateral breast reconstruction 12/20/2013   Acquired absence of bilateral breasts and nipples 09/24/2013   History of breast cancer 08/17/2013   Anxiety 06/28/2013   Eczema 06/28/2013   Malignant neoplasm of upper-inner quadrant of right breast in female, estrogen receptor positive (Sherry Barry) 06/20/2012   Essential hypertension 05/02/2012   Migraine 04/17/2012    is allergic to codeine, hydrocodone, latex, lisinopril, and tomato.  MEDICAL HISTORY: Past Medical History:  Diagnosis Date   Allergy    Breast cancer ([place])    Breast cancer ([place])    right   History of chemotherapy    doxetaxel/carboplatin/trastuzumab   History of migraine    last one about a week ago   Hx of radiation therapy 01/01/13- 02/15/13   r chest wall, r supraclav/axilla 5040 cGy/28 sessions, scar boost 1000 cGy/5 sessions   Hypertension    no meds,   urgent care on pomana   Migraine    Migraine     SURGICAL HISTORY: Past Surgical History:  Procedure Laterality Date   ABDOMINAL HYSTERECTOMY     no salpingo-oophorectomy 2009   BREAST RECONSTRUCTION WITH PLACEMENT OF TISSUE EXPANDER AND FLEX HD (ACELLULAR HYDRATED DERMIS) Left 09/24/2013   IR IMAGING GUIDED PORT INSERTION  10/07/2021   IR THORACENTESIS ASP PLEURAL SPACE W/IMG GUIDE  08/11/2021   IR THORACENTESIS ASP PLEURAL SPACE W/IMG GUIDE  10/07/2021   LATISSIMUS FLAP TO BREAST Right 09/24/2013   Procedure: RIGHT LATISSMUS MYOCUTAEIOUS MUSCLE FLAP AND PLACEMENT OF TISSUE [name];  Surgeon: Theodoro Kos, DO;  Location: [facility];  Service: Plastics;  Laterality: Right;   LIPOSUCTION WITH LIPOFILLING Bilateral 12/12/2013   Procedure: LIPOSUCTION WITH LIPOFILLING;  Surgeon: Theodoro Kos, DO;  Location: [facility];  Service: Plastics;  Laterality: Bilateral;   PORT-A-CATH REMOVAL Left 12/12/2013    Procedure: REMOVAL PORT-A-CATH;  Surgeon: Theodoro Kos, DO;  Location: [place];  Service: Plastics;  Laterality: Left;   PORTACATH PLACEMENT  07/14/2012   Procedure: INSERTION PORT-A-CATH;  Surgeon: Joyice Faster. Cornett, MD;  Location: [facility];  Service: General;  Laterality: Left;   RECONSTRUCTION BREAST W/ LATISSIMUS DORSI FLAP Right 09/24/2013   "& tissue expander placement"   REMOVAL OF BILATERAL TISSUE EXPANDERS WITH PLACEMENT OF BILATERAL BREAST IMPLANTS Bilateral 12/12/2013   Procedure: REMOVAL OF BILATERAL TISSUE EXPANDERS WITH PLACEMENT OF BILATERAL BREAST IMPLANTS/BILATERAL CAPSULECTOMIES WITH  LIPOFILLING FAT GRAFTING;  Surgeon: Theodoro Kos, DO;  Location: [facility];  Service: Plastics;  Laterality: Bilateral;   SIMPLE MASTECTOMY WITH AXILLARY SENTINEL NODE BIOPSY  07/14/2012   Procedure: SIMPLE MASTECTOMY WITH AXILLARY SENTINEL NODE BIOPSY;  Surgeon: Joyice Faster. Cornett, MD;  Location: [place] [facility];  Service: General;  Laterality: Right;  Bilateral simple mastectomy with port and right sebtibel lymph node mapping   SIMPLE MASTECTOMY WITH AXILLARY SENTINEL NODE BIOPSY  07/14/2012   Procedure: SIMPLE MASTECTOMY;  Surgeon: Joyice Faster. Cornett, MD;  Location: [facility] [place];  Service: General;  Laterality: Left;   TISSUE EXPANDER PLACEMENT Left 09/24/2013   Procedure: PLACEMENT OF TISSUE EXPANDER AND FLEX HD TO LEFT BREAST;  Surgeon: Theodoro Kos, DO;  Location: [place] [facility];  Service: Plastics;  Laterality: Left;    SOCIAL HISTORY: Social History  Socioeconomic History   Marital status: Married    Spouse name: Not on file   Number of children: 2   Years of education: Not on file   Highest education level: Not on file  Occupational History    Employer: [employer]  Tobacco Use   Smoking status: Never   Smokeless tobacco: Never  Substance and Sexual Activity   Alcohol use: Yes    Comment: occasional   Drug use: No   Sexual activity: Yes    Birth control/protection:  Surgical  Other Topics Concern   Not on file  Social History Narrative   Not on file   Social Determinants of Health   Financial Resource Strain: Not on file  Food Insecurity: Not on file  Transportation Needs: Not on file  Physical Activity: Not on file  Stress: Not on file  Social Connections: Not on file  Intimate Partner Violence: Not on file    FAMILY HISTORY: Family History  Problem Relation Age of Onset   Hypertension Mother    Alcohol abuse Mother    Heart disease Maternal Grandmother    Stroke Maternal Grandfather    Colon cancer Maternal Aunt 24       alive at 4   Brain cancer Maternal Uncle 65       and lymphoma in early 37s; deceased   Brain cancer Maternal Uncle 60       deceased   Pancreatic cancer Maternal Uncle 80       alive at 34   Breast cancer Cousin 64       mat 1st cousin once removed through mat GF ; deceased   Breast cancer Maternal Aunt        great aunt through mat GF; dx at ? age    Review of Systems  Constitutional:  Negative for appetite change, chills, fatigue, fever and unexpected weight change.  HENT:   Negative for hearing loss, lump/mass and trouble swallowing.   Eyes:  Negative for eye problems and icterus.  Respiratory:  Positive for shortness of breath. Negative for chest tightness and cough.   Cardiovascular:  Negative for chest pain, leg swelling and palpitations.  Gastrointestinal:  Negative for abdominal distention, abdominal pain, constipation, diarrhea, nausea and vomiting.  Endocrine: Negative for hot flashes.  Genitourinary:  Negative for difficulty urinating.   Musculoskeletal:  Negative for arthralgias.  Skin:  Negative for itching and rash.  Neurological:  Negative for dizziness, extremity weakness, headaches and numbness.  Hematological:  Negative for adenopathy. Does not bruise/bleed easily.  Psychiatric/Behavioral:  Negative for depression. The patient is not nervous/anxious.       PHYSICAL EXAMINATION  ECOG  PERFORMANCE STATUS: 1 - Symptomatic but completely ambulatory  Vitals:   11/19/21 1203 11/19/21 1204  BP: (!) 143/104 (!) 142/96  Pulse: (!) 110   Resp: 19   Temp: (!) 97.2 F (36.2 C)   SpO2: 99%     Physical Exam Constitutional:      General: She is not in acute distress.    Appearance: Normal appearance. She is not toxic-appearing.  HENT:     Head: Normocephalic and atraumatic.  Eyes:     General: No scleral icterus. Cardiovascular:     Rate and Rhythm: Normal rate and regular rhythm.     Pulses: Normal pulses.     Heart sounds: Normal heart sounds.  Pulmonary:     Effort: Pulmonary effort is normal.     Breath sounds: Normal breath sounds.  Abdominal:     General: Abdomen is flat. Bowel sounds are normal. There is no distension.     Palpations: Abdomen is soft.     Tenderness: There is no abdominal tenderness.  Musculoskeletal:        General: No swelling.     Cervical back: Neck supple.  Lymphadenopathy:     Cervical: No cervical adenopathy.  Skin:    General: Skin is warm and dry.     Findings: No rash.  Neurological:     General: No focal deficit present.     Mental Status: She is alert.  Psychiatric:        Mood and Affect: Mood normal.        Behavior: Behavior normal.     LABORATORY DATA:  CBC    Component Value Date/Time   WBC 12.9 (H) 11/19/2021 1102   RBC 4.32 11/19/2021 1102   HGB 12.8 11/19/2021 1102   HGB 15.9 10/30/2019 1030   HGB 14.7 02/15/2017 0852   HCT 38.0 11/19/2021 1102   HCT 46.1 10/30/2019 1030   HCT 42.9 02/15/2017 0852   PLT 275 11/19/2021 1102   PLT 245 10/30/2019 1030   MCV 88.0 11/19/2021 1102   MCV 88 10/30/2019 1030   MCV 86.7 02/15/2017 0852   MCH 29.6 11/19/2021 1102   MCHC 33.7 11/19/2021 1102   RDW 15.3 11/19/2021 1102   RDW 12.5 10/30/2019 1030   RDW 12.6 02/15/2017 0852   LYMPHSABS 1.3 11/19/2021 1102   LYMPHSABS 1.4 02/15/2017 0852   MONOABS 0.3 11/19/2021 1102   MONOABS 0.3 02/15/2017 0852   EOSABS  0.0 11/19/2021 1102   EOSABS 0.1 02/15/2017 0852   BASOSABS 0.1 11/19/2021 1102   BASOSABS 0.0 02/15/2017 0852    CMP     Component Value Date/Time   NA 144 11/19/2021 1102   NA 140 10/30/2019 1030   NA 141 02/15/2017 0852   K 3.1 (L) 11/19/2021 1102   K 3.9 02/15/2017 0852   CL 108 11/19/2021 1102   CL 101 11/15/2012 0904   CO2 27 11/19/2021 1102   CO2 25 02/15/2017 0852   GLUCOSE 97 11/19/2021 1102   GLUCOSE 87 02/15/2017 0852   GLUCOSE 69 (L) 11/15/2012 0904   BUN 12 11/19/2021 1102   BUN 11 10/30/2019 1030   BUN 12.5 02/15/2017 0852   CREATININE 0.63 11/19/2021 1102   CREATININE 0.9 02/15/2017 0852   CALCIUM 9.4 11/19/2021 1102   CALCIUM 9.3 02/15/2017 0852   PROT 6.5 11/19/2021 1102   PROT 6.7 10/30/2019 1030   PROT 7.0 02/15/2017 0852   ALBUMIN 4.0 11/19/2021 1102   ALBUMIN 4.2 10/30/2019 1030   ALBUMIN 3.6 02/15/2017 0852   AST 27 11/19/2021 1102   AST 16 02/15/2017 0852   ALT 26 11/19/2021 1102   ALT 13 02/15/2017 0852   ALKPHOS 68 11/19/2021 1102   ALKPHOS 63 02/15/2017 0852   BILITOT 0.4 11/19/2021 1102   BILITOT 0.5 10/30/2019 1030   BILITOT 0.69 02/15/2017 0852   GFRNONAA >60 11/19/2021 1102   GFRAA 100 10/30/2019 1030     ASSESSMENT and THERAPY PLAN:   Malignant neoplasm of upper-inner quadrant of right breast in female, estrogen receptor positive ([place]) Sherry Barry is a 51 year old woman with stage IV triple positive breast cancer currently on treatment with Taxotere Herceptin and Perjeta.   She is tolerating it well overall well except for fatigue and some muscleaches. PE no significant concerns.  No evidence of large pleural effusion on exam  today. She has some baseline tachycardia which has not significantly worsened. CBC and CMP reviewed, okay to proceed with chemotherapy as planned.  No dose reduction needed.  Anticipate repeat imaging after 3 cycles, this has been ordered today.  She was given the phone number to call and schedule the CT  scans. Return to clinic in 3 weeks   No indication for thoracentesis on my examination today. Grade 1 fatigue, expected from chemotherapy, no dose modification needed. Myalgias/arthralgias again grade 1,  no dose reduction needed  All questions were answered. The patient knows to call the clinic with any problems, questions or concerns. We can certainly see the patient much sooner if necessary.  Total encounter time:30 minutes*in face-to-face visit time, chart review, lab review, care coordination, order entry, and documentation of the encounter time.  *Total Encounter Time as defined by the Centers for Medicare and Medicaid Services includes, in addition to the face-to-face time of a patient visit (documented in the note above) non-face-to-face time: obtaining and reviewing outside history, ordering and reviewing medications, tests or procedures, care coordination (communications with other health care professionals or caregivers) and documentation in the medical record.

## 2021-11-19 NOTE — Assessment & Plan Note (Addendum)
Sherry Barry is a 51 year old woman with stage IV triple positive breast cancer currently on treatment with Taxotere Herceptin and Perjeta.   She is tolerating it well overall well except for fatigue and some muscleaches. PE no significant concerns.  No evidence of large pleural effusion on exam today. She has some baseline tachycardia which has not significantly worsened. CBC and CMP reviewed, okay to proceed with chemotherapy as planned.  No dose reduction needed.  Anticipate repeat imaging after 3 cycles, this has been ordered today.  She was given the phone number to call and schedule the CT scans. Return to clinic in 3 weeks

## 2021-11-20 ENCOUNTER — Ambulatory Visit: Payer: Federal, State, Local not specified - PPO

## 2021-11-20 ENCOUNTER — Inpatient Hospital Stay: Payer: Federal, State, Local not specified - PPO

## 2021-11-20 VITALS — BP 136/94 | HR 122 | Temp 98.1°F | Resp 20

## 2021-11-20 DIAGNOSIS — Z9221 Personal history of antineoplastic chemotherapy: Secondary | ICD-10-CM | POA: Diagnosis not present

## 2021-11-20 DIAGNOSIS — Z9013 Acquired absence of bilateral breasts and nipples: Secondary | ICD-10-CM | POA: Diagnosis not present

## 2021-11-20 DIAGNOSIS — Z808 Family history of malignant neoplasm of other organs or systems: Secondary | ICD-10-CM | POA: Diagnosis not present

## 2021-11-20 DIAGNOSIS — C50211 Malignant neoplasm of upper-inner quadrant of right female breast: Secondary | ICD-10-CM | POA: Diagnosis not present

## 2021-11-20 DIAGNOSIS — Z170 Estrogen receptor positive status [ER+]: Secondary | ICD-10-CM | POA: Diagnosis not present

## 2021-11-20 DIAGNOSIS — Z5189 Encounter for other specified aftercare: Secondary | ICD-10-CM | POA: Diagnosis not present

## 2021-11-20 DIAGNOSIS — Z5111 Encounter for antineoplastic chemotherapy: Secondary | ICD-10-CM | POA: Diagnosis not present

## 2021-11-20 DIAGNOSIS — Z807 Family history of other malignant neoplasms of lymphoid, hematopoietic and related tissues: Secondary | ICD-10-CM | POA: Diagnosis not present

## 2021-11-20 DIAGNOSIS — Z800 Family history of malignant neoplasm of digestive organs: Secondary | ICD-10-CM | POA: Diagnosis not present

## 2021-11-20 DIAGNOSIS — Z923 Personal history of irradiation: Secondary | ICD-10-CM | POA: Diagnosis not present

## 2021-11-20 DIAGNOSIS — Z803 Family history of malignant neoplasm of breast: Secondary | ICD-10-CM | POA: Diagnosis not present

## 2021-11-20 DIAGNOSIS — Z5112 Encounter for antineoplastic immunotherapy: Secondary | ICD-10-CM | POA: Diagnosis not present

## 2021-11-20 MED ORDER — ACETAMINOPHEN 325 MG PO TABS
650.0000 mg | ORAL_TABLET | Freq: Once | ORAL | Status: AC
  Administered 2021-11-20: 650 mg via ORAL
  Filled 2021-11-20: qty 2

## 2021-11-20 MED ORDER — HEPARIN SOD (PORK) LOCK FLUSH 100 UNIT/ML IV SOLN
500.0000 [IU] | Freq: Once | INTRAVENOUS | Status: AC | PRN
  Administered 2021-11-20: 500 [IU]

## 2021-11-20 MED ORDER — SODIUM CHLORIDE 0.9 % IV SOLN
420.0000 mg | Freq: Once | INTRAVENOUS | Status: AC
  Administered 2021-11-20: 420 mg via INTRAVENOUS
  Filled 2021-11-20: qty 14

## 2021-11-20 MED ORDER — SODIUM CHLORIDE 0.9 % IV SOLN
75.0000 mg/m2 | Freq: Once | INTRAVENOUS | Status: AC
  Administered 2021-11-20: 130 mg via INTRAVENOUS
  Filled 2021-11-20: qty 13

## 2021-11-20 MED ORDER — TRASTUZUMAB-DKST CHEMO 150 MG IV SOLR
6.0000 mg/kg | Freq: Once | INTRAVENOUS | Status: AC
  Administered 2021-11-20: 399 mg via INTRAVENOUS
  Filled 2021-11-20: qty 19

## 2021-11-20 MED ORDER — SODIUM CHLORIDE 0.9 % IV SOLN
10.0000 mg | Freq: Once | INTRAVENOUS | Status: AC
  Administered 2021-11-20: 10 mg via INTRAVENOUS
  Filled 2021-11-20: qty 10

## 2021-11-20 MED ORDER — SODIUM CHLORIDE 0.9% FLUSH
10.0000 mL | INTRAVENOUS | Status: DC | PRN
  Administered 2021-11-20: 10 mL

## 2021-11-20 MED ORDER — DIPHENHYDRAMINE HCL 25 MG PO CAPS
50.0000 mg | ORAL_CAPSULE | Freq: Once | ORAL | Status: AC
  Administered 2021-11-20: 50 mg via ORAL
  Filled 2021-11-20: qty 2

## 2021-11-20 MED ORDER — SODIUM CHLORIDE 0.9 % IV SOLN: Freq: Once | INTRAVENOUS | Status: AC

## 2021-11-20 NOTE — Progress Notes (Signed)
Per Dr. Chryl Heck, ok for treatment today with elevated heart rate 130-115. Pt. denies chest pain, dizziness, and no shortness of breath noted.

## 2021-11-20 NOTE — Patient Instructions (Addendum)
[facility]  Discharge Instructions: Thank you for choosing [facility] to provide your oncology and hematology care.   If you have a lab appointment with the Cancer Center, please go directly to the Cancer Center and check in at the registration area.   Wear comfortable clothing and clothing appropriate for easy access to any Portacath or PICC line.   We strive to give you quality time with your provider. You may need to reschedule your appointment if you arrive late (15 or more minutes).  Arriving late affects you and other patients whose appointments are after yours.  Also, if you miss three or more appointments without notifying the office, you may be dismissed from the clinic at the provider's discretion.      For prescription refill requests, have your pharmacy contact our office and allow 72 hours for refills to be completed.    Today you received the following chemotherapy and/or immunotherapy agents: Trastuzumab-dkst (Ogivri) and Pertuzumab (Perjeta), and Docetaxel (Taxotere).   To help prevent nausea and vomiting after your treatment, we encourage you to take your nausea medication as directed.  BELOW ARE SYMPTOMS THAT SHOULD BE REPORTED IMMEDIATELY: *FEVER GREATER THAN 100.4 F (38 C) OR HIGHER *CHILLS OR SWEATING *NAUSEA AND VOMITING THAT IS NOT CONTROLLED WITH YOUR NAUSEA MEDICATION *UNUSUAL SHORTNESS OF BREATH *UNUSUAL BRUISING OR BLEEDING *URINARY PROBLEMS (pain or burning when urinating, or frequent urination) *BOWEL PROBLEMS (unusual diarrhea, constipation, pain near the anus) TENDERNESS IN MOUTH AND THROAT WITH OR WITHOUT PRESENCE OF ULCERS (sore throat, sores in mouth, or a toothache) UNUSUAL RASH, SWELLING OR PAIN  UNUSUAL VAGINAL DISCHARGE OR ITCHING   Items with * indicate a potential emergency and should be followed up as soon as possible or go to the Emergency Department if any problems should occur.  Please show the  CHEMOTHERAPY ALERT CARD or IMMUNOTHERAPY ALERT CARD at check-in to the Emergency Department and triage nurse.  Should you have questions after your visit or need to cancel or reschedule your appointment, please contact [facility]  Dept: [phone]  and follow the prompts.  Office hours are 8:00 a.m. to 4:30 p.m. Monday - Friday. Please note that voicemails left after 4:00 p.m. may not be returned until the following business day.  We are closed weekends and major holidays. You have access to a nurse at all times for urgent questions. Please call the main number to the clinic Dept: [phone] and follow the prompts.   For any non-urgent questions, you may also contact your provider using MyChart. We now offer e-Visits for anyone 18 and older to request care online for non-urgent symptoms. For details visit [email or web address].   Also download the MyChart app! Go to the app store, search "MyChart", open the app, select [facility], and log in with your MyChart username and password.  Due to Covid, a mask is required upon entering the hospital/clinic. If you do not have a mask, one will be given to you upon arrival. For doctor visits, patients may have 1 support person aged 18 or older with them. For treatment visits, patients cannot have anyone with them due to current Covid guidelines and our immunocompromised population.  

## 2021-11-23 ENCOUNTER — Other Ambulatory Visit: Payer: Self-pay

## 2021-11-23 ENCOUNTER — Inpatient Hospital Stay: Payer: Federal, State, Local not specified - PPO

## 2021-11-23 VITALS — BP 141/100 | HR 102 | Temp 98.7°F

## 2021-11-23 DIAGNOSIS — Z9221 Personal history of antineoplastic chemotherapy: Secondary | ICD-10-CM | POA: Diagnosis not present

## 2021-11-23 DIAGNOSIS — Z808 Family history of malignant neoplasm of other organs or systems: Secondary | ICD-10-CM | POA: Diagnosis not present

## 2021-11-23 DIAGNOSIS — Z807 Family history of other malignant neoplasms of lymphoid, hematopoietic and related tissues: Secondary | ICD-10-CM | POA: Diagnosis not present

## 2021-11-23 DIAGNOSIS — Z5111 Encounter for antineoplastic chemotherapy: Secondary | ICD-10-CM | POA: Diagnosis not present

## 2021-11-23 DIAGNOSIS — Z800 Family history of malignant neoplasm of digestive organs: Secondary | ICD-10-CM | POA: Diagnosis not present

## 2021-11-23 DIAGNOSIS — Z803 Family history of malignant neoplasm of breast: Secondary | ICD-10-CM | POA: Diagnosis not present

## 2021-11-23 DIAGNOSIS — Z9013 Acquired absence of bilateral breasts and nipples: Secondary | ICD-10-CM | POA: Diagnosis not present

## 2021-11-23 DIAGNOSIS — C50211 Malignant neoplasm of upper-inner quadrant of right female breast: Secondary | ICD-10-CM | POA: Diagnosis not present

## 2021-11-23 DIAGNOSIS — Z5112 Encounter for antineoplastic immunotherapy: Secondary | ICD-10-CM | POA: Diagnosis not present

## 2021-11-23 DIAGNOSIS — Z5189 Encounter for other specified aftercare: Secondary | ICD-10-CM | POA: Diagnosis not present

## 2021-11-23 DIAGNOSIS — Z923 Personal history of irradiation: Secondary | ICD-10-CM | POA: Diagnosis not present

## 2021-11-23 DIAGNOSIS — Z170 Estrogen receptor positive status [ER+]: Secondary | ICD-10-CM | POA: Diagnosis not present

## 2021-11-23 MED ORDER — PEGFILGRASTIM-CBQV 6 MG/0.6ML ~~LOC~~ SOSY
6.0000 mg | PREFILLED_SYRINGE | Freq: Once | SUBCUTANEOUS | Status: AC
  Administered 2021-11-23: 6 mg via SUBCUTANEOUS
  Filled 2021-11-23: qty 0.6

## 2021-11-25 ENCOUNTER — Telehealth (HOSPITAL_COMMUNITY): Payer: Self-pay | Admitting: Radiology

## 2021-11-25 NOTE — Telephone Encounter (Signed)
Opened in [place]

## 2021-12-04 ENCOUNTER — Ambulatory Visit (HOSPITAL_COMMUNITY)
Admission: RE | Admit: 2021-12-04 | Discharge: 2021-12-04 | Disposition: A | Discharge status: Home / Self Care | Payer: Federal, State, Local not specified - PPO | Source: Ambulatory Visit | Attending: Hematology and Oncology | Admitting: Hematology and Oncology

## 2021-12-04 ENCOUNTER — Encounter (HOSPITAL_COMMUNITY): Payer: Self-pay

## 2021-12-04 DIAGNOSIS — C50211 Malignant neoplasm of upper-inner quadrant of right female breast: Secondary | ICD-10-CM | POA: Insufficient documentation

## 2021-12-04 DIAGNOSIS — K449 Diaphragmatic hernia without obstruction or gangrene: Secondary | ICD-10-CM | POA: Diagnosis not present

## 2021-12-04 DIAGNOSIS — K760 Fatty (change of) liver, not elsewhere classified: Secondary | ICD-10-CM | POA: Diagnosis not present

## 2021-12-04 DIAGNOSIS — Z170 Estrogen receptor positive status [ER+]: Secondary | ICD-10-CM | POA: Diagnosis not present

## 2021-12-04 DIAGNOSIS — J90 Pleural effusion, not elsewhere classified: Secondary | ICD-10-CM | POA: Diagnosis not present

## 2021-12-04 DIAGNOSIS — R918 Other nonspecific abnormal finding of lung field: Secondary | ICD-10-CM | POA: Diagnosis not present

## 2021-12-04 DIAGNOSIS — C7951 Secondary malignant neoplasm of bone: Secondary | ICD-10-CM | POA: Diagnosis not present

## 2021-12-04 DIAGNOSIS — C50919 Malignant neoplasm of unspecified site of unspecified female breast: Secondary | ICD-10-CM | POA: Diagnosis not present

## 2021-12-04 MED ORDER — SODIUM CHLORIDE (PF) 0.9 % IJ SOLN
INTRAMUSCULAR | Status: AC
  Filled 2021-12-04: qty 50

## 2021-12-04 MED ORDER — IOHEXOL 300 MG/ML  SOLN
100.0000 mL | Freq: Once | INTRAMUSCULAR | Status: AC | PRN
  Administered 2021-12-04: 100 mL via INTRAVENOUS

## 2021-12-09 MED FILL — Dexamethasone Sodium Phosphate Inj 100 MG/10ML: INTRAMUSCULAR | Qty: 1 | Status: AC

## 2021-12-10 ENCOUNTER — Encounter: Payer: Self-pay | Admitting: Hematology and Oncology

## 2021-12-10 ENCOUNTER — Inpatient Hospital Stay (HOSPITAL_BASED_OUTPATIENT_CLINIC_OR_DEPARTMENT_OTHER): Payer: Federal, State, Local not specified - PPO | Admitting: Hematology and Oncology

## 2021-12-10 ENCOUNTER — Inpatient Hospital Stay: Payer: Federal, State, Local not specified - PPO

## 2021-12-10 ENCOUNTER — Other Ambulatory Visit: Payer: Self-pay

## 2021-12-10 DIAGNOSIS — R000 Tachycardia, unspecified: Secondary | ICD-10-CM | POA: Diagnosis not present

## 2021-12-10 DIAGNOSIS — Z807 Family history of other malignant neoplasms of lymphoid, hematopoietic and related tissues: Secondary | ICD-10-CM | POA: Diagnosis not present

## 2021-12-10 DIAGNOSIS — C50919 Malignant neoplasm of unspecified site of unspecified female breast: Secondary | ICD-10-CM

## 2021-12-10 DIAGNOSIS — R110 Nausea: Secondary | ICD-10-CM | POA: Diagnosis not present

## 2021-12-10 DIAGNOSIS — T451X5A Adverse effect of antineoplastic and immunosuppressive drugs, initial encounter: Secondary | ICD-10-CM

## 2021-12-10 DIAGNOSIS — Z803 Family history of malignant neoplasm of breast: Secondary | ICD-10-CM | POA: Diagnosis not present

## 2021-12-10 DIAGNOSIS — Z5111 Encounter for antineoplastic chemotherapy: Secondary | ICD-10-CM | POA: Diagnosis not present

## 2021-12-10 DIAGNOSIS — Z9221 Personal history of antineoplastic chemotherapy: Secondary | ICD-10-CM | POA: Diagnosis not present

## 2021-12-10 DIAGNOSIS — C50211 Malignant neoplasm of upper-inner quadrant of right female breast: Secondary | ICD-10-CM

## 2021-12-10 DIAGNOSIS — Z9013 Acquired absence of bilateral breasts and nipples: Secondary | ICD-10-CM | POA: Diagnosis not present

## 2021-12-10 DIAGNOSIS — Z923 Personal history of irradiation: Secondary | ICD-10-CM | POA: Diagnosis not present

## 2021-12-10 DIAGNOSIS — Z5189 Encounter for other specified aftercare: Secondary | ICD-10-CM | POA: Diagnosis not present

## 2021-12-10 DIAGNOSIS — Z170 Estrogen receptor positive status [ER+]: Secondary | ICD-10-CM | POA: Diagnosis not present

## 2021-12-10 DIAGNOSIS — Z800 Family history of malignant neoplasm of digestive organs: Secondary | ICD-10-CM | POA: Diagnosis not present

## 2021-12-10 DIAGNOSIS — Z5112 Encounter for antineoplastic immunotherapy: Secondary | ICD-10-CM | POA: Diagnosis not present

## 2021-12-10 DIAGNOSIS — Z808 Family history of malignant neoplasm of other organs or systems: Secondary | ICD-10-CM | POA: Diagnosis not present

## 2021-12-10 LAB — COMPREHENSIVE METABOLIC PANEL
ALT: 22 U/L (ref 0–44)
AST: 20 U/L (ref 15–41)
Albumin: 4.1 g/dL (ref 3.5–5.0)
Alkaline Phosphatase: 81 U/L (ref 38–126)
Anion gap: 11 (ref 5–15)
BUN: 17 mg/dL (ref 6–20)
CO2: 25 mmol/L (ref 22–32)
Calcium: 9.6 mg/dL (ref 8.9–10.3)
Chloride: 108 mmol/L (ref 98–111)
Creatinine, Ser: 0.69 mg/dL (ref 0.44–1.00)
GFR, Estimated: >60 mL/min (ref >60)
Glucose, Bld: 139 mg/dL — ABNORMAL HIGH (ref 70–99)
Potassium: 3.3 mmol/L — ABNORMAL LOW (ref 3.5–5.1)
Sodium: 144 mmol/L (ref 135–145)
Total Bilirubin: 0.3 mg/dL (ref 0.3–1.2)
Total Protein: 6.5 g/dL (ref 6.5–8.1)

## 2021-12-10 LAB — CBC WITH DIFFERENTIAL/PLATELET
Abs Immature Granulocytes: 0.36 10*3/uL — ABNORMAL HIGH (ref 0.00–0.07)
Basophils Absolute: 0 10*3/uL (ref 0.0–0.1)
Basophils Relative: 0 %
Eosinophils Absolute: 0 10*3/uL (ref 0.0–0.5)
Eosinophils Relative: 0 %
HCT: 35.1 % — ABNORMAL LOW (ref 36.0–46.0)
Hemoglobin: 11.7 g/dL — ABNORMAL LOW (ref 12.0–15.0)
Immature Granulocytes: 3 %
Lymphocytes Relative: 7 %
Lymphs Abs: 1 10*3/uL (ref 0.7–4.0)
MCH: 30.1 pg (ref 26.0–34.0)
MCHC: 33.3 g/dL (ref 30.0–36.0)
MCV: 90.2 fL (ref 80.0–100.0)
Monocytes Absolute: 0.3 10*3/uL (ref 0.1–1.0)
Monocytes Relative: 2 %
Neutro Abs: 12.4 10*3/uL — ABNORMAL HIGH (ref 1.7–7.7)
Neutrophils Relative %: 88 %
Platelets: 305 10*3/uL (ref 150–400)
RBC: 3.89 MIL/uL (ref 3.87–5.11)
RDW: 17.7 % — ABNORMAL HIGH (ref 11.5–15.5)
WBC: 14.1 10*3/uL — ABNORMAL HIGH (ref 4.0–10.5)
nRBC: 0 % (ref 0.0–0.2)

## 2021-12-10 MED ORDER — SODIUM CHLORIDE 0.9 % IV SOLN
10.0000 mg | Freq: Once | INTRAVENOUS | Status: AC
  Administered 2021-12-10: 10 mg via INTRAVENOUS
  Filled 2021-12-10: qty 10

## 2021-12-10 MED ORDER — SODIUM CHLORIDE 0.9 % IV SOLN: Freq: Once | INTRAVENOUS | Status: AC

## 2021-12-10 MED ORDER — ACETAMINOPHEN 325 MG PO TABS
650.0000 mg | ORAL_TABLET | Freq: Once | ORAL | Status: AC
  Administered 2021-12-10: 650 mg via ORAL
  Filled 2021-12-10: qty 2

## 2021-12-10 MED ORDER — SODIUM CHLORIDE 0.9% FLUSH
10.0000 mL | INTRAVENOUS | Status: DC | PRN
  Administered 2021-12-10: 10 mL

## 2021-12-10 MED ORDER — SODIUM CHLORIDE 0.9 % IV SOLN
420.0000 mg | Freq: Once | INTRAVENOUS | Status: AC
  Administered 2021-12-10: 420 mg via INTRAVENOUS
  Filled 2021-12-10: qty 14

## 2021-12-10 MED ORDER — HEPARIN SOD (PORK) LOCK FLUSH 100 UNIT/ML IV SOLN
500.0000 [IU] | Freq: Once | INTRAVENOUS | Status: AC | PRN
  Administered 2021-12-10: 500 [IU]

## 2021-12-10 MED ORDER — DIPHENHYDRAMINE HCL 25 MG PO CAPS
50.0000 mg | ORAL_CAPSULE | Freq: Once | ORAL | Status: AC
  Administered 2021-12-10: 50 mg via ORAL
  Filled 2021-12-10: qty 2

## 2021-12-10 MED ORDER — SODIUM CHLORIDE 0.9 % IV SOLN
75.0000 mg/m2 | Freq: Once | INTRAVENOUS | Status: AC
  Administered 2021-12-10: 130 mg via INTRAVENOUS
  Filled 2021-12-10: qty 13

## 2021-12-10 MED ORDER — TRASTUZUMAB-DKST CHEMO 150 MG IV SOLR
6.0000 mg/kg | Freq: Once | INTRAVENOUS | Status: AC
  Administered 2021-12-10: 399 mg via INTRAVENOUS
  Filled 2021-12-10: qty 19

## 2021-12-10 NOTE — Assessment & Plan Note (Addendum)
Sherry Barry is a 51 year old woman with stage IV triple positive breast cancer currently on treatment with Taxotere Herceptin and Perjeta.   Interim imaging after 3 cycles showed no new evidence of metastatic disease, decrease in left pleural effusion suggesting resolving pleural metastatic disease few second scattered sclerotic metastatic lesions which have not changed significantly.  Overall these findings are consistent with response.  She is here before planned cycle 4 of chemotherapy. She is tolerating treatment well except for some grade 1 fatigue, nausea without vomiting and ankle swelling. No concerning findings on physical exam except sinus tachycaria. We have reviewed imaging findings which is consistent with response. I have given her dental clearance letter to initiate bisphosphonates.  We have discussed about risks of osteonecrosis of the jaw and hypocalcemia with bisphosphonates. We will start her on zoledronic acid once we have dental clearance letter.  She expressed understanding of all the recommendations

## 2021-12-10 NOTE — Assessment & Plan Note (Signed)
Chemotherapy-induced nausea without vomiting, okay to continue as needed nausea medication.  We will continue to monitor

## 2021-12-10 NOTE — Progress Notes (Signed)
Ok to proceed with HR of 139 per Dr. Chryl Heck. EKG done.

## 2021-12-10 NOTE — Assessment & Plan Note (Signed)
Sinus tachycardia with pulse rate of 139 today. We will proceed with a twelve-lead EKG today while she is in infusion.  She also follows up with Dr. Loralie Champagne from cardiology, have sent an in basket message to him for further recommendations. Since she is clinically doing well, we will proceed with chemotherapy as planned Labs reviewed and satisfactory to proceed

## 2021-12-10 NOTE — Progress Notes (Signed)
[facility] Follow up:    Sherry Congress, NP [place] [address]   DIAGNOSIS:  Cancer Staging  Malignant neoplasm of upper-inner quadrant of right breast in female, estrogen receptor positive ([place]) Staging form: Breast, AJCC 7th Edition - Clinical: Stage IA (T1c, N0, cM0) - Unsigned Specimen type: Core Needle Biopsy Histopathologic type: 9931 Laterality: Right Staging comments: Staged at breast conference 1.15.14  - Pathologic stage from 07/14/2012: Stage IV ([place], NX, M1) - Signed by [name], MD on 08/28/2021 Specimen type: Core Needle Biopsy Histopathologic type: 9931 Laterality: Right   SUMMARY OF ONCOLOGIC HISTORY: Oncology History  Malignant neoplasm of upper-inner quadrant of right breast in female, estrogen receptor positive ([place])  07/13/2012 Clinical Stage   Stage IA: T1c N0   07/14/2012 Definitive Surgery   Bilateral mastectomy/right SLNB: RIGHT invasive ductal carcinoma, grade 3, ER+, PR +, Her2/neu positive (ratio 3.02), Ki67 48%. DCIS. 1/4 LN positive for malignancy. LEFT: benign   07/14/2012 Pathologic Stage   Stage IIB: T2 N1a M0   07/14/2012 Cancer Staging   Staging form: Breast, AJCC 7th Edition - Pathologic stage from 07/14/2012: Stage IV ([place], NX, M1) - Signed by [name], MD on 08/28/2021 Specimen type: Core Needle Biopsy Histopathologic type: 9931 Laterality: Right   08/17/2012 - 08/30/2013 Chemotherapy   Adjuvant carboplatin, docetaxel, and trastuzumab x 6 cycles (completed 11/30/2012) followed by maintenance trastuzumab to total one year   11/2012 Procedure   Comp Cancer Gene panel (Gene[facility]x) negative for deleterious mutations    - 02/2013 Radiation Therapy   Adjuvant RT to right breast   02/2013 -  Anti-estrogen oral therapy   Tamoxifen 20 mg daily   09/24/2013 Surgery   Bilateral breast reconstruction with latissmus flap and expander placement   12/12/2013 Surgery   Implant placement     Relapse/Recurrence   Left sided malignant pleural effusion.  Tumor cells are positive for GATA3 and ER, negative for TTF-1 consistent with recurrent breast carcinoma Prognostic showed ER 90% positive strong staining, PR 80% positive strong staining, HER2 negative.    09/16/2021 Imaging   PET scan showed malignant left pleural effusion with extensive areas of nodularity and hypermetabolic activity involving the mediastinal border and pericardium as well as the most inferior aspects of the costodiaphragmatic recess.  Signs of nodal disease at the thoracic inlet on the left suspect extension of disease below the diaphragm along the left anterolateral aorta.  Nonspecific moderate to markedly increased metabolic activity about the base of the tongue bilaterally.  Consider direct visualization showing mildly asymmetric uptake favoring the right lingual tonsil.  Sclerotic foci without marked increased metabolic activity suspicious for metastatic disease perhaps treated or questions.  Heterogeneous marrow uptake seen elsewhere generalized and nonspecific.  Consider spinal MRI is warranted  MRI brain without any evidence of intracranial metastatic disease.   10/09/2021 -  Chemotherapy   Patient is on Treatment Plan : BREAST DOCEtaxel + Trastuzumab + Pertuzumab (THP) q21d x 8 cycles / Trastuzumab + Pertuzumab q21d x 4 cycles     12/04/2021 Imaging   There is no evidence new metastatic disease. There is interval decrease in the left pleural effusion possibly suggesting resolving pleural metastatic disease. Few scattered sclerotic metastatic lesions in the skeletal structures have not changed significantly.   No acute findings are seen in the chest abdomen and pelvis.       CURRENT THERAPY: THP  INTERVAL HISTORY:  Sherry Barry 51 y.o. female returns for follow-up for cycle  4 of THP She feels tired from chemo. She has felt nauseated, but no vomiting. No tingling or numbness in hands or feet. No  fevers or chills. Breathing is a lot better, she is able to do more. No diarrhea, has noted some acid reflux. No headaches, changes in vision, strength. Sleep is ok, doesn't get much sleep on day of chemo. Mild ankle swelling. Rest of the pertinent 10 point ROS reviewed and negative.  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  MEDICAL HISTORY: Past Medical History:  Diagnosis Date   Allergy    Breast cancer ([place])    Breast cancer ([place])    right   History of chemotherapy    doxetaxel/carboplatin/trastuzumab   History of migraine    last one about a week ago   Hx of radiation therapy 01/01/13- 02/15/13   r chest wall, r supraclav/axilla 5040 cGy/28 sessions, scar boost 1000 cGy/5 sessions   Hypertension    no meds,   urgent care on [place]ana   Migraine    Migraine     SURGICAL HISTORY: Past Surgical History:  Procedure Laterality Date   ABDOMINAL HYSTERECTOMY     no salpingo-oophorectomy 2009   BREAST RECONSTRUCTION WITH PLACEMENT OF TISSUE EXPANDER AND FLEX HD (ACELLULAR HYDRATED DERMIS) Left 09/24/2013   IR IMAGING GUIDED PORT INSERTION  10/07/2021   IR THORACENTESIS ASP PLEURAL SPACE W/IMG GUIDE  08/11/2021   IR THORACENTESIS ASP PLEURAL SPACE W/IMG GUIDE  10/07/2021    LATISSIMUS FLAP TO BREAST Right 09/24/2013   Procedure: RIGHT LATISSMUS MYOCUTAEIOUS MUSCLE FLAP AND PLACEMENT OF TISSUE [name];  Surgeon: Theodoro Kos, DO;  Location: [facility];  Service: Plastics;  Laterality: Right;   LIPOSUCTION WITH LIPOFILLING Bilateral 12/12/2013   Procedure: LIPOSUCTION WITH LIPOFILLING;  Surgeon: Theodoro Kos, DO;  Location: [facility];  Service: Plastics;  Laterality: Bilateral;   PORT-A-CATH REMOVAL Left 12/12/2013   Procedure: REMOVAL PORT-A-CATH;  Surgeon: Theodoro Kos, DO;  Location: [facility];  Service: Plastics;  Laterality: Left;   PORTACATH PLACEMENT  07/14/2012   Procedure: INSERTION PORT-A-CATH;  Surgeon: Joyice Faster. Cornett, MD;  Location: [facility];  Service: General;  Laterality: Left;   RECONSTRUCTION BREAST W/ LATISSIMUS DORSI FLAP Right 09/24/2013   "& tissue expander placement"   REMOVAL OF BILATERAL TISSUE EXPANDERS WITH PLACEMENT OF BILATERAL BREAST IMPLANTS Bilateral 12/12/2013   Procedure: REMOVAL OF BILATERAL TISSUE EXPANDERS WITH PLACEMENT OF BILATERAL BREAST IMPLANTS/BILATERAL CAPSULECTOMIES WITH  LIPOFILLING FAT GRAFTING;  Surgeon: Theodoro Kos, DO;  Location: [facility];  Service: Plastics;  Laterality: Bilateral;   SIMPLE MASTECTOMY WITH AXILLARY SENTINEL NODE BIOPSY  07/14/2012   Procedure: SIMPLE MASTECTOMY WITH AXILLARY SENTINEL NODE BIOPSY;  Surgeon: Joyice Faster. Cornett, MD;  Location: [facility];  Service: General;  Laterality: Right;  Bilateral simple mastectomy with port and right sebtibel lymph node mapping  SIMPLE MASTECTOMY WITH AXILLARY SENTINEL NODE BIOPSY  07/14/2012   Procedure: SIMPLE MASTECTOMY;  Surgeon: Joyice Faster. Cornett, MD;  Location: [facility];  Service: General;  Laterality: Left;   TISSUE EXPANDER PLACEMENT Left 09/24/2013   Procedure: PLACEMENT OF TISSUE EXPANDER AND FLEX HD TO LEFT BREAST;  Surgeon: Theodoro Kos, DO;  Location: [facility];  Service: Plastics;  Laterality: Left;    SOCIAL  HISTORY: Social History   Socioeconomic History   Marital status: Married    Spouse name: Not on file   Number of children: 2   Years of education: Not on file   Highest education level: Not on file  Occupational History    Employer: [employer]  Tobacco Use   Smoking status: Never   Smokeless tobacco: Never  Substance and Sexual Activity   Alcohol use: Yes    Comment: occasional   Drug use: No   Sexual activity: Yes    Birth control/protection: Surgical  Other Topics Concern   Not on file  Social History Narrative   Not on file   Social Determinants of Health   Financial Resource Strain: Not on file  Food Insecurity: Not on file  Transportation Needs: Not on file  Physical Activity: Not on file  Stress: Not on file  Social Connections: Not on file  Intimate Partner Violence: Not on file    FAMILY HISTORY: Family History  Problem Relation Age of Onset   Hypertension Mother    Alcohol abuse Mother    Heart disease Maternal Grandmother    Stroke Maternal Grandfather    Colon cancer Maternal Aunt 12       alive at 62   Brain cancer Maternal Uncle 49       and lymphoma in early 69s; deceased   Brain cancer Maternal Uncle 60       deceased   Pancreatic cancer Maternal Uncle 34       alive at 74   Breast cancer Cousin 25       mat 1st cousin once removed through mat GF ; deceased   Breast cancer Maternal Aunt        great aunt through mat GF; dx at ? age    Review of Systems  Constitutional:  Negative for appetite change, chills, fatigue, fever and unexpected weight change.  HENT:   Negative for hearing loss, lump/mass and trouble swallowing.   Eyes:  Negative for eye problems and icterus.  Respiratory:  Positive for shortness of breath. Negative for chest tightness and cough.   Cardiovascular:  Negative for chest pain, leg swelling and palpitations.  Gastrointestinal:  Negative for abdominal distention, abdominal pain, constipation, diarrhea, nausea and  vomiting.  Endocrine: Negative for hot flashes.  Genitourinary:  Negative for difficulty urinating.   Musculoskeletal:  Negative for arthralgias.  Skin:  Negative for itching and rash.  Neurological:  Negative for dizziness, extremity weakness, headaches and numbness.  Hematological:  Negative for adenopathy. Does not bruise/bleed easily.  Psychiatric/Behavioral:  Negative for depression. The patient is not nervous/anxious.       PHYSICAL EXAMINATION  ECOG PERFORMANCE STATUS: 1 - Symptomatic but completely ambulatory  Vitals:   12/10/21 0935  BP: (!) 141/99  Pulse: (!) 139  Resp: 17  Temp: 98.1 F (36.7 C)  SpO2: 100%     Physical Exam Constitutional:      General: She is not in acute distress.    Appearance: Normal appearance. She is not toxic-appearing.  HENT:  Head: Normocephalic and atraumatic.  Eyes:     General: No scleral icterus. Cardiovascular:     Rate and Rhythm: Regular rhythm. Tachycardia present.     Pulses: Normal pulses.     Heart sounds: Normal heart sounds.  Pulmonary:     Effort: Pulmonary effort is normal.     Breath sounds: Normal breath sounds.  Abdominal:     General: Abdomen is flat. Bowel sounds are normal. There is no distension.     Palpations: Abdomen is soft.     Tenderness: There is no abdominal tenderness.  Musculoskeletal:        General: Swelling (Mild ankle swelling bilaterally) present.     Cervical back: Neck supple.  Lymphadenopathy:     Cervical: No cervical adenopathy.  Skin:    General: Skin is warm and dry.     Findings: No rash.  Neurological:     General: No focal deficit present.     Mental Status: She is alert.  Psychiatric:        Mood and Affect: Mood normal.        Behavior: Behavior normal.     LABORATORY DATA:  CBC    Component Value Date/Time   WBC 14.1 (H) 12/10/2021 0911   RBC 3.89 12/10/2021 0911   HGB 11.7 (L) 12/10/2021 0911   HGB 15.9 10/30/2019 1030   HGB 14.7 02/15/2017 0852   HCT  35.1 (L) 12/10/2021 0911   HCT 46.1 10/30/2019 1030   HCT 42.9 02/15/2017 0852   PLT 305 12/10/2021 0911   PLT 245 10/30/2019 1030   MCV 90.2 12/10/2021 0911   MCV 88 10/30/2019 1030   MCV 86.7 02/15/2017 0852   MCH 30.1 12/10/2021 0911   MCHC 33.3 12/10/2021 0911   RDW 17.7 (H) 12/10/2021 0911   RDW 12.5 10/30/2019 1030   RDW 12.6 02/15/2017 0852   LYMPHSABS 1.0 12/10/2021 0911   LYMPHSABS 1.4 02/15/2017 0852   MONOABS 0.3 12/10/2021 0911   MONOABS 0.3 02/15/2017 0852   EOSABS 0.0 12/10/2021 0911   EOSABS 0.1 02/15/2017 0852   BASOSABS 0.0 12/10/2021 0911   BASOSABS 0.0 02/15/2017 0852    CMP     Component Value Date/Time   NA 144 12/10/2021 0911   NA 140 10/30/2019 1030   NA 141 02/15/2017 0852   K 3.3 (L) 12/10/2021 0911   K 3.9 02/15/2017 0852   CL 108 12/10/2021 0911   CL 101 11/15/2012 0904   CO2 25 12/10/2021 0911   CO2 25 02/15/2017 0852   GLUCOSE 139 (H) 12/10/2021 0911   GLUCOSE 87 02/15/2017 0852   GLUCOSE 69 (L) 11/15/2012 0904   BUN 17 12/10/2021 0911   BUN 11 10/30/2019 1030   BUN 12.5 02/15/2017 0852   CREATININE 0.69 12/10/2021 0911   CREATININE 0.9 02/15/2017 0852   CALCIUM 9.6 12/10/2021 0911   CALCIUM 9.3 02/15/2017 0852   PROT 6.5 12/10/2021 0911   PROT 6.7 10/30/2019 1030   PROT 7.0 02/15/2017 0852   ALBUMIN 4.1 12/10/2021 0911   ALBUMIN 4.2 10/30/2019 1030   ALBUMIN 3.6 02/15/2017 0852   AST 20 12/10/2021 0911   AST 16 02/15/2017 0852   ALT 22 12/10/2021 0911   ALT 13 02/15/2017 0852   ALKPHOS 81 12/10/2021 0911   ALKPHOS 63 02/15/2017 0852   BILITOT 0.3 12/10/2021 0911   BILITOT 0.5 10/30/2019 1030   BILITOT 0.69 02/15/2017 0852   GFRNONAA >60 12/10/2021 0911   GFRAA 100 10/30/2019 1030  ASSESSMENT and THERAPY PLAN:   Malignant neoplasm of upper-inner quadrant of right breast in female, estrogen receptor positive ([place]) Sherry Barry is a 51 year old woman with stage IV triple positive breast cancer currently on treatment with  Taxotere Herceptin and Perjeta.   Interim imaging after 3 cycles showed no new evidence of metastatic disease, decrease in left pleural effusion suggesting resolving pleural metastatic disease few second scattered sclerotic metastatic lesions which have not changed significantly.  Overall these findings are consistent with response.  She is here before planned cycle 4 of chemotherapy. She is tolerating treatment well except for some grade 1 fatigue, nausea without vomiting and ankle swelling. No concerning findings on physical exam except sinus tachycaria. We have reviewed imaging findings which is consistent with response. I have given her dental clearance letter to initiate bisphosphonates.  We have discussed about risks of osteonecrosis of the jaw and hypocalcemia with bisphosphonates. We will start her on zoledronic acid once we have dental clearance letter.  She expressed understanding of all the recommendations  Chemotherapy-induced nausea Chemotherapy-induced nausea without vomiting, okay to continue as needed nausea medication.  We will continue to monitor  Tachycardia Sinus tachycardia with pulse rate of 139 today. We will proceed with a twelve-lead EKG today while she is in infusion.  She also follows up with Dr. Loralie Champagne from cardiology, have sent an in basket message to him for further recommendations. Since she is clinically doing well, we will proceed with chemotherapy as planned Labs reviewed and satisfactory to proceed    All questions were answered. The patient knows to call the clinic with any problems, questions or concerns. We can certainly see the patient much sooner if necessary.  Total encounter time:40 minutes*in face-to-face visit time, chart review, lab review, care coordination, order entry, and documentation of the encounter time.  *Total Encounter Time as defined by the Centers for Medicare and Medicaid Services includes, in addition to the face-to-face time of  a patient visit (documented in the note above) non-face-to-face time: obtaining and reviewing outside history, ordering and reviewing medications, tests or procedures, care coordination (communications with other health care professionals or caregivers) and documentation in the medical record.

## 2021-12-10 NOTE — Patient Instructions (Signed)
[facility]  Discharge Instructions: Thank you for choosing [facility] to provide your oncology and hematology care.   If you have a lab appointment with the [facility], please go directly to the [facility] and check in at the registration area.   Wear comfortable clothing and clothing appropriate for easy access to any Portacath or PICC line.   We strive to give you quality time with your provider. You may need to reschedule your appointment if you arrive late (15 or more minutes).  Arriving late affects you and other patients whose appointments are after yours.  Also, if you miss three or more appointments without notifying the office, you may be dismissed from the clinic at the provider's discretion.      For prescription refill requests, have your pharmacy contact our office and allow 72 hours for refills to be completed.    Today you received the following chemotherapy and/or immunotherapy agents: Trastuzumab-dkst (Ogivri) and Pertuzumab (Perjeta), and Docetaxel (Taxotere).   To help prevent nausea and vomiting after your treatment, we encourage you to take your nausea medication as directed.  BELOW ARE SYMPTOMS THAT SHOULD BE REPORTED IMMEDIATELY: *FEVER GREATER THAN 100.4 F (38 C) OR HIGHER *CHILLS OR SWEATING *NAUSEA AND VOMITING THAT IS NOT CONTROLLED WITH YOUR NAUSEA MEDICATION *UNUSUAL SHORTNESS OF BREATH *UNUSUAL BRUISING OR BLEEDING *URINARY PROBLEMS (pain or burning when urinating, or frequent urination) *BOWEL PROBLEMS (unusual diarrhea, constipation, pain near the anus) TENDERNESS IN MOUTH AND THROAT WITH OR WITHOUT PRESENCE OF ULCERS (sore throat, sores in mouth, or a toothache) UNUSUAL RASH, SWELLING OR PAIN  UNUSUAL VAGINAL DISCHARGE OR ITCHING   Items with * indicate a potential emergency and should be followed up as soon as possible or go to the Emergency Department if any problems should occur.  Please show the  CHEMOTHERAPY ALERT CARD or IMMUNOTHERAPY ALERT CARD at check-in to the Emergency Department and triage nurse.  Should you have questions after your visit or need to cancel or reschedule your appointment, please contact [facility]  Dept: [phone]  and follow the prompts.  Office hours are 8:00 a.m. to 4:30 p.m. Monday - Friday. Please note that voicemails left after 4:00 p.m. may not be returned until the following business day.  We are closed weekends and major holidays. You have access to a nurse at all times for urgent questions. Please call the main number to the clinic Dept: [phone] and follow the prompts.   For any non-urgent questions, you may also contact your provider using MyChart. We now offer e-Visits for anyone 78 and older to request care online for non-urgent symptoms. For details visit [email or web address].   Also download the MyChart app! Go to the app store, search "MyChart", open the app, select , and log in with your MyChart username and password.  Due to Covid, a mask is required upon entering the hospital/clinic. If you do not have a mask, one will be given to you upon arrival. For doctor visits, patients may have 1 support person aged 5 or older with them. For treatment visits, patients cannot have anyone with them due to current Covid guidelines and our immunocompromised population.

## 2021-12-12 ENCOUNTER — Inpatient Hospital Stay: Payer: Federal, State, Local not specified - PPO | Attending: Hematology and Oncology

## 2021-12-12 VITALS — BP 130/90 | HR 119 | Temp 99.3°F | Resp 18

## 2021-12-12 DIAGNOSIS — C7951 Secondary malignant neoplasm of bone: Secondary | ICD-10-CM | POA: Diagnosis not present

## 2021-12-12 DIAGNOSIS — Z5112 Encounter for antineoplastic immunotherapy: Secondary | ICD-10-CM | POA: Insufficient documentation

## 2021-12-12 DIAGNOSIS — Z5189 Encounter for other specified aftercare: Secondary | ICD-10-CM | POA: Insufficient documentation

## 2021-12-12 DIAGNOSIS — Z9013 Acquired absence of bilateral breasts and nipples: Secondary | ICD-10-CM | POA: Diagnosis not present

## 2021-12-12 DIAGNOSIS — R000 Tachycardia, unspecified: Secondary | ICD-10-CM | POA: Insufficient documentation

## 2021-12-12 DIAGNOSIS — Z170 Estrogen receptor positive status [ER+]: Secondary | ICD-10-CM | POA: Diagnosis not present

## 2021-12-12 DIAGNOSIS — C50211 Malignant neoplasm of upper-inner quadrant of right female breast: Secondary | ICD-10-CM | POA: Insufficient documentation

## 2021-12-12 DIAGNOSIS — K219 Gastro-esophageal reflux disease without esophagitis: Secondary | ICD-10-CM | POA: Diagnosis not present

## 2021-12-12 DIAGNOSIS — E876 Hypokalemia: Secondary | ICD-10-CM | POA: Insufficient documentation

## 2021-12-12 DIAGNOSIS — R110 Nausea: Secondary | ICD-10-CM | POA: Diagnosis not present

## 2021-12-12 DIAGNOSIS — Z923 Personal history of irradiation: Secondary | ICD-10-CM | POA: Diagnosis not present

## 2021-12-12 DIAGNOSIS — Z5111 Encounter for antineoplastic chemotherapy: Secondary | ICD-10-CM | POA: Diagnosis not present

## 2021-12-12 MED ORDER — PEGFILGRASTIM-CBQV 6 MG/0.6ML ~~LOC~~ SOSY
6.0000 mg | PREFILLED_SYRINGE | Freq: Once | SUBCUTANEOUS | Status: AC
  Administered 2021-12-12: 6 mg via SUBCUTANEOUS

## 2021-12-17 ENCOUNTER — Other Ambulatory Visit: Payer: Self-pay

## 2021-12-17 ENCOUNTER — Inpatient Hospital Stay: Payer: Federal, State, Local not specified - PPO

## 2021-12-17 VITALS — BP 126/97 | HR 119 | Temp 99.2°F | Resp 18

## 2021-12-17 DIAGNOSIS — Z5112 Encounter for antineoplastic immunotherapy: Secondary | ICD-10-CM | POA: Diagnosis not present

## 2021-12-17 DIAGNOSIS — Z923 Personal history of irradiation: Secondary | ICD-10-CM | POA: Diagnosis not present

## 2021-12-17 DIAGNOSIS — Z5111 Encounter for antineoplastic chemotherapy: Secondary | ICD-10-CM | POA: Diagnosis not present

## 2021-12-17 DIAGNOSIS — C50211 Malignant neoplasm of upper-inner quadrant of right female breast: Secondary | ICD-10-CM

## 2021-12-17 DIAGNOSIS — C7951 Secondary malignant neoplasm of bone: Secondary | ICD-10-CM | POA: Diagnosis not present

## 2021-12-17 DIAGNOSIS — R110 Nausea: Secondary | ICD-10-CM | POA: Diagnosis not present

## 2021-12-17 DIAGNOSIS — Z170 Estrogen receptor positive status [ER+]: Secondary | ICD-10-CM | POA: Diagnosis not present

## 2021-12-17 DIAGNOSIS — Z9013 Acquired absence of bilateral breasts and nipples: Secondary | ICD-10-CM | POA: Diagnosis not present

## 2021-12-17 DIAGNOSIS — E876 Hypokalemia: Secondary | ICD-10-CM | POA: Diagnosis not present

## 2021-12-17 DIAGNOSIS — Z5189 Encounter for other specified aftercare: Secondary | ICD-10-CM | POA: Diagnosis not present

## 2021-12-17 DIAGNOSIS — R000 Tachycardia, unspecified: Secondary | ICD-10-CM | POA: Diagnosis not present

## 2021-12-17 DIAGNOSIS — K219 Gastro-esophageal reflux disease without esophagitis: Secondary | ICD-10-CM | POA: Diagnosis not present

## 2021-12-17 MED ORDER — GOSERELIN ACETATE 3.6 MG ~~LOC~~ IMPL
3.6000 mg | DRUG_IMPLANT | Freq: Once | SUBCUTANEOUS | Status: AC
  Administered 2021-12-17: 3.6 mg via SUBCUTANEOUS
  Filled 2021-12-17: qty 3.6

## 2022-01-01 ENCOUNTER — Inpatient Hospital Stay: Payer: Federal, State, Local not specified - PPO

## 2022-01-01 ENCOUNTER — Other Ambulatory Visit: Payer: Self-pay

## 2022-01-01 ENCOUNTER — Encounter: Payer: Self-pay | Admitting: *Deleted

## 2022-01-01 ENCOUNTER — Inpatient Hospital Stay (HOSPITAL_BASED_OUTPATIENT_CLINIC_OR_DEPARTMENT_OTHER): Payer: Federal, State, Local not specified - PPO | Admitting: Physician Assistant

## 2022-01-01 VITALS — HR 116

## 2022-01-01 VITALS — BP 138/99 | HR 120 | Temp 98.1°F | Resp 15 | Wt 160.0 lb

## 2022-01-01 DIAGNOSIS — C50211 Malignant neoplasm of upper-inner quadrant of right female breast: Secondary | ICD-10-CM

## 2022-01-01 DIAGNOSIS — Z5111 Encounter for antineoplastic chemotherapy: Secondary | ICD-10-CM | POA: Diagnosis not present

## 2022-01-01 DIAGNOSIS — Z5112 Encounter for antineoplastic immunotherapy: Secondary | ICD-10-CM | POA: Diagnosis not present

## 2022-01-01 DIAGNOSIS — K219 Gastro-esophageal reflux disease without esophagitis: Secondary | ICD-10-CM | POA: Diagnosis not present

## 2022-01-01 DIAGNOSIS — Z5189 Encounter for other specified aftercare: Secondary | ICD-10-CM | POA: Diagnosis not present

## 2022-01-01 DIAGNOSIS — Z170 Estrogen receptor positive status [ER+]: Secondary | ICD-10-CM | POA: Diagnosis not present

## 2022-01-01 DIAGNOSIS — Z9013 Acquired absence of bilateral breasts and nipples: Secondary | ICD-10-CM | POA: Diagnosis not present

## 2022-01-01 DIAGNOSIS — Z923 Personal history of irradiation: Secondary | ICD-10-CM | POA: Diagnosis not present

## 2022-01-01 DIAGNOSIS — C7951 Secondary malignant neoplasm of bone: Secondary | ICD-10-CM | POA: Diagnosis not present

## 2022-01-01 DIAGNOSIS — C50919 Malignant neoplasm of unspecified site of unspecified female breast: Secondary | ICD-10-CM

## 2022-01-01 DIAGNOSIS — R110 Nausea: Secondary | ICD-10-CM | POA: Diagnosis not present

## 2022-01-01 DIAGNOSIS — R000 Tachycardia, unspecified: Secondary | ICD-10-CM | POA: Diagnosis not present

## 2022-01-01 DIAGNOSIS — E876 Hypokalemia: Secondary | ICD-10-CM | POA: Diagnosis not present

## 2022-01-01 LAB — CBC WITH DIFFERENTIAL/PLATELET
Abs Immature Granulocytes: 0.14 10*3/uL — ABNORMAL HIGH (ref 0.00–0.07)
Basophils Absolute: 0 10*3/uL (ref 0.0–0.1)
Basophils Relative: 0 %
Eosinophils Absolute: 0 10*3/uL (ref 0.0–0.5)
Eosinophils Relative: 0 %
HCT: 34.7 % — ABNORMAL LOW (ref 36.0–46.0)
Hemoglobin: 11.6 g/dL — ABNORMAL LOW (ref 12.0–15.0)
Immature Granulocytes: 2 %
Lymphocytes Relative: 7 %
Lymphs Abs: 0.6 10*3/uL — ABNORMAL LOW (ref 0.7–4.0)
MCH: 30.2 pg (ref 26.0–34.0)
MCHC: 33.4 g/dL (ref 30.0–36.0)
MCV: 90.4 fL (ref 80.0–100.0)
Monocytes Absolute: 0.4 10*3/uL (ref 0.1–1.0)
Monocytes Relative: 4 %
Neutro Abs: 8.5 10*3/uL — ABNORMAL HIGH (ref 1.7–7.7)
Neutrophils Relative %: 87 %
Platelets: 300 10*3/uL (ref 150–400)
RBC: 3.84 MIL/uL — ABNORMAL LOW (ref 3.87–5.11)
RDW: 18.2 % — ABNORMAL HIGH (ref 11.5–15.5)
WBC: 9.6 10*3/uL (ref 4.0–10.5)
nRBC: 0 % (ref 0.0–0.2)

## 2022-01-01 LAB — COMPREHENSIVE METABOLIC PANEL
ALT: 16 U/L (ref 0–44)
AST: 16 U/L (ref 15–41)
Albumin: 4 g/dL (ref 3.5–5.0)
Alkaline Phosphatase: 65 U/L (ref 38–126)
Anion gap: 10 (ref 5–15)
BUN: 14 mg/dL (ref 6–20)
CO2: 25 mmol/L (ref 22–32)
Calcium: 9.7 mg/dL (ref 8.9–10.3)
Chloride: 108 mmol/L (ref 98–111)
Creatinine, Ser: 0.68 mg/dL (ref 0.44–1.00)
GFR, Estimated: >60 mL/min (ref >60)
Glucose, Bld: 152 mg/dL — ABNORMAL HIGH (ref 70–99)
Potassium: 3.4 mmol/L — ABNORMAL LOW (ref 3.5–5.1)
Sodium: 143 mmol/L (ref 135–145)
Total Bilirubin: 0.4 mg/dL (ref 0.3–1.2)
Total Protein: 6.2 g/dL — ABNORMAL LOW (ref 6.5–8.1)

## 2022-01-01 MED ORDER — SODIUM CHLORIDE 0.9% FLUSH
10.0000 mL | INTRAVENOUS | Status: DC | PRN
  Administered 2022-01-01: 10 mL

## 2022-01-01 MED ORDER — SODIUM CHLORIDE 0.9 % IV SOLN
75.0000 mg/m2 | Freq: Once | INTRAVENOUS | Status: AC
  Administered 2022-01-01: 130 mg via INTRAVENOUS
  Filled 2022-01-01: qty 13

## 2022-01-01 MED ORDER — POTASSIUM CHLORIDE CRYS ER 20 MEQ PO TBCR: 20.0000 meq | EXTENDED_RELEASE_TABLET | Freq: Every day | ORAL | 0 refills | Status: DC

## 2022-01-01 MED ORDER — DEXAMETHASONE 4 MG PO TABS: ORAL_TABLET | ORAL | 1 refills | Status: DC

## 2022-01-01 MED ORDER — SODIUM CHLORIDE 0.9 % IV SOLN
420.0000 mg | Freq: Once | INTRAVENOUS | Status: AC
  Administered 2022-01-01: 420 mg via INTRAVENOUS
  Filled 2022-01-01: qty 14

## 2022-01-01 MED ORDER — HEPARIN SOD (PORK) LOCK FLUSH 100 UNIT/ML IV SOLN
500.0000 [IU] | Freq: Once | INTRAVENOUS | Status: AC | PRN
  Administered 2022-01-01: 500 [IU]

## 2022-01-01 MED ORDER — SODIUM CHLORIDE 0.9 % IV SOLN
10.0000 mg | Freq: Once | INTRAVENOUS | Status: AC
  Administered 2022-01-01: 10 mg via INTRAVENOUS
  Filled 2022-01-01: qty 10

## 2022-01-01 MED ORDER — TRASTUZUMAB-DKST CHEMO 150 MG IV SOLR
6.0000 mg/kg | Freq: Once | INTRAVENOUS | Status: AC
  Administered 2022-01-01: 399 mg via INTRAVENOUS
  Filled 2022-01-01: qty 19

## 2022-01-01 MED ORDER — DIPHENHYDRAMINE HCL 25 MG PO CAPS
50.0000 mg | ORAL_CAPSULE | Freq: Once | ORAL | Status: AC
  Administered 2022-01-01: 50 mg via ORAL
  Filled 2022-01-01: qty 2

## 2022-01-01 MED ORDER — ACETAMINOPHEN 325 MG PO TABS
650.0000 mg | ORAL_TABLET | Freq: Once | ORAL | Status: AC
  Administered 2022-01-01: 650 mg via ORAL
  Filled 2022-01-01: qty 2

## 2022-01-01 MED ORDER — SODIUM CHLORIDE 0.9 % IV SOLN: Freq: Once | INTRAVENOUS | Status: AC

## 2022-01-01 MED ORDER — PANTOPRAZOLE SODIUM 40 MG PO TBEC: 40.0000 mg | DELAYED_RELEASE_TABLET | Freq: Every evening | ORAL | 3 refills | Status: DC

## 2022-01-01 NOTE — Progress Notes (Signed)
Sherry Barry, Sherry Barry made aware of HR 116. Per Sherry Barry, Sherry Barry ok to proceed with tx.

## 2022-01-01 NOTE — Progress Notes (Signed)
[facility] Follow up:    Sherry Congress, NP [place] [address]   DIAGNOSIS:  Cancer Staging  Malignant neoplasm of upper-inner quadrant of right breast in female, estrogen receptor positive ([place]) Staging form: Breast, AJCC 7th Edition - Clinical: Stage IA (T1c, N0, cM0) - Unsigned Specimen type: Core Needle Biopsy Histopathologic type: 9931 Laterality: Right Staging comments: Staged at breast conference 1.15.14  - Pathologic stage from 07/14/2012: Stage IV ([place], NX, M1) - Signed by [name], MD on 08/28/2021 Specimen type: Core Needle Biopsy Histopathologic type: 9931 Laterality: Right   SUMMARY OF ONCOLOGIC HISTORY: Oncology History  Malignant neoplasm of upper-inner quadrant of right breast in female, estrogen receptor positive ([place])  07/13/2012 Clinical Stage   Stage IA: T1c N0   07/14/2012 Definitive Surgery   Bilateral mastectomy/right SLNB: RIGHT invasive ductal carcinoma, grade 3, ER+, PR +, Her2/neu positive (ratio 3.02), Ki67 48%. DCIS. 1/4 LN positive for malignancy. LEFT: benign   07/14/2012 Pathologic Stage   Stage IIB: T2 N1a M0   07/14/2012 Cancer Staging   Staging form: Breast, AJCC 7th Edition - Pathologic stage from 07/14/2012: Stage IV ([place], NX, M1) - Signed by [name], MD on 08/28/2021 Specimen type: Core Needle Biopsy Histopathologic type: 9931 Laterality: Right   08/17/2012 - 08/30/2013 Chemotherapy   Adjuvant carboplatin, docetaxel, and trastuzumab x 6 cycles (completed 11/30/2012) followed by maintenance trastuzumab to total one year   11/2012 Procedure   Comp Cancer Gene panel (Gene[facility]) negative for deleterious mutations    - 02/2013 Radiation Therapy   Adjuvant RT to right breast   02/2013 -  Anti-estrogen oral therapy   Tamoxifen 20 mg daily   09/24/2013 Surgery   Bilateral breast reconstruction with latissmus flap and expander placement   12/12/2013 Surgery   Implant placement     Relapse/Recurrence   Left sided malignant pleural effusion.  Tumor cells are positive for GATA3 and ER, negative for TTF-1 consistent with recurrent breast carcinoma Prognostic showed ER 90% positive strong staining, PR 80% positive strong staining, HER2 negative.    09/16/2021 Imaging   PET scan showed malignant left pleural effusion with extensive areas of nodularity and hypermetabolic activity involving the mediastinal border and pericardium as well as the most inferior aspects of the costodiaphragmatic recess.  Signs of nodal disease at the thoracic inlet on the left suspect extension of disease below the diaphragm along the left anterolateral aorta.  Nonspecific moderate to markedly increased metabolic activity about the base of the tongue bilaterally.  Consider direct visualization showing mildly asymmetric uptake favoring the right lingual tonsil.  Sclerotic foci without marked increased metabolic activity suspicious for metastatic disease perhaps treated or questions.  Heterogeneous marrow uptake seen elsewhere generalized and nonspecific.  Consider spinal MRI is warranted  MRI brain without any evidence of intracranial metastatic disease.   10/09/2021 -  Chemotherapy   Patient is on Treatment Plan : BREAST DOCEtaxel + Trastuzumab + Pertuzumab (THP) q21d x 8 cycles / Trastuzumab + Pertuzumab q21d x 4 cycles     12/04/2021 Imaging   There is no evidence new metastatic disease. There is interval decrease in the left pleural effusion possibly suggesting resolving pleural metastatic disease. Few scattered sclerotic metastatic lesions in the skeletal structures have not changed significantly.   No acute findings are seen in the chest abdomen and pelvis.       CURRENT THERAPY: THP  INTERVAL HISTORY:  Sherry Barry 51 y.o. female returns for follow-up for cycle  5 of THP.  She is accompanied by her husband for this visit.  She reports she continues to tolerate the treatment without  any prohibitive toxicities.  She does experience some fatigue but can complete her daily activities on her own.  She does have a decreased appetite due to metallic taste.  She reports nausea for several days following treatment but symptoms improved with prescribed antiemetics.  She denies any vomiting episodes.  Patient reports constipation but symptoms improved after she drinks some apple juice.  Patient denies easy bruising or signs of active bleeding.  She has noticed intermittent episodes of tingling in her fingertips but denies any interference with her dexterity or grip.  She reports having swelling in her lower extremities towards the end of the day which makes it difficult for her to ambulate for long distances.  She tries to elevate her legs throughout the day.  She denies fevers, chills, night sweats, shortness of breath, chest pain or cough.  She has no other complaints.  Rest of the pertinent 10 point ROS reviewed and negative.  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  MEDICAL HISTORY: Past Medical History:  Diagnosis Date   Allergy    Breast cancer ([place])    Breast cancer ([place])    right   History of chemotherapy    doxetaxel/carboplatin/trastuzumab   History of migraine     last one about a week ago   Hx of radiation therapy 01/01/13- 02/15/13   r chest wall, r supraclav/axilla 5040 cGy/28 sessions, scar boost 1000 cGy/5 sessions   Hypertension    no meds,   urgent care on pomana   Migraine    Migraine     SURGICAL HISTORY: Past Surgical History:  Procedure Laterality Date   ABDOMINAL HYSTERECTOMY     no salpingo-oophorectomy 2009   BREAST RECONSTRUCTION WITH PLACEMENT OF TISSUE EXPANDER AND FLEX HD (ACELLULAR HYDRATED DERMIS) Left 09/24/2013   IR IMAGING GUIDED PORT INSERTION  10/07/2021   IR THORACENTESIS ASP PLEURAL SPACE W/IMG GUIDE  08/11/2021   IR THORACENTESIS ASP PLEURAL SPACE W/IMG GUIDE  10/07/2021   LATISSIMUS FLAP TO BREAST Right 09/24/2013   Procedure: RIGHT LATISSMUS MYOCUTAEIOUS MUSCLE FLAP AND PLACEMENT OF TISSUE [name];  Surgeon: Theodoro Kos, DO;  Location: [facility];  Service: Plastics;  Laterality: Right;   LIPOSUCTION WITH LIPOFILLING Bilateral 12/12/2013   Procedure: LIPOSUCTION WITH LIPOFILLING;  Surgeon: Theodoro Kos, DO;  Location: [facility];  Service: Plastics;  Laterality: Bilateral;   PORT-A-CATH REMOVAL Left 12/12/2013   Procedure: REMOVAL PORT-A-CATH;  Surgeon: Theodoro Kos, DO;  Location: [facility];  Service: Plastics;  Laterality: Left;   PORTACATH PLACEMENT  07/14/2012   Procedure: INSERTION PORT-A-CATH;  Surgeon: Joyice Faster. Cornett, MD;  Location: [facility];  Service: General;  Laterality: Left;   RECONSTRUCTION BREAST W/ LATISSIMUS DORSI FLAP Right 09/24/2013   "&  tissue expander placement"   REMOVAL OF BILATERAL TISSUE EXPANDERS WITH PLACEMENT OF BILATERAL BREAST IMPLANTS Bilateral 12/12/2013   Procedure: REMOVAL OF BILATERAL TISSUE EXPANDERS WITH PLACEMENT OF BILATERAL BREAST IMPLANTS/BILATERAL CAPSULECTOMIES WITH  LIPOFILLING FAT GRAFTING;  Surgeon: Theodoro Kos, DO;  Location: [facility];  Service: Plastics;  Laterality: Bilateral;   SIMPLE MASTECTOMY WITH AXILLARY SENTINEL NODE BIOPSY   07/14/2012   Procedure: SIMPLE MASTECTOMY WITH AXILLARY SENTINEL NODE BIOPSY;  Surgeon: Joyice Faster. Cornett, MD;  Location: [facility];  Service: General;  Laterality: Right;  Bilateral simple mastectomy with port and right sebtibel lymph node mapping   SIMPLE MASTECTOMY WITH AXILLARY SENTINEL NODE BIOPSY  07/14/2012   Procedure: SIMPLE MASTECTOMY;  Surgeon: Joyice Faster. Cornett, MD;  Location: [facility];  Service: General;  Laterality: Left;   TISSUE EXPANDER PLACEMENT Left 09/24/2013   Procedure: PLACEMENT OF TISSUE EXPANDER AND FLEX HD TO LEFT BREAST;  Surgeon: Theodoro Kos, DO;  Location: [facility];  Service: Plastics;  Laterality: Left;    SOCIAL HISTORY: Social History   Socioeconomic History   Marital status: Married    Spouse name: Not on file   Number of children: 2   Years of education: Not on file   Highest education level: Not on file  Occupational History    Employer: [employer]  Tobacco Use   Smoking status: Never   Smokeless tobacco: Never  Substance and Sexual Activity   Alcohol use: Yes    Comment: occasional   Drug use: No   Sexual activity: Yes    Birth control/protection: Surgical  Other Topics Concern   Not on file  Social History Narrative   Not on file   Social Determinants of Health   Financial Resource Strain: Not on file  Food Insecurity: Not on file  Transportation Needs: Not on file  Physical Activity: Not on file  Stress: Not on file  Social Connections: Not on file  Intimate Partner Violence: Not on file    FAMILY HISTORY: Family History  Problem Relation Age of Onset   Hypertension Mother    Alcohol abuse Mother    Heart disease Maternal Grandmother    Stroke Maternal Grandfather    Colon cancer Maternal Aunt 81       alive at 36   Brain cancer Maternal Uncle 17       and lymphoma in early 5s; deceased   Brain cancer Maternal Uncle 60       deceased   Pancreatic cancer Maternal Uncle 83       alive at 54   Breast cancer Cousin 24       mat  1st cousin once removed through mat GF ; deceased   Breast cancer Maternal Aunt        great aunt through mat GF; dx at ? age    Review of Systems  Constitutional:  Positive for fatigue. Negative for appetite change, chills, fever and unexpected weight change.  HENT:   Negative for hearing loss, lump/mass and trouble swallowing.   Eyes:  Negative for eye problems and icterus.  Respiratory:  Negative for chest tightness and cough.   Cardiovascular:  Negative for chest pain, leg swelling and palpitations.  Gastrointestinal:  Positive for constipation and nausea. Negative for abdominal distention, abdominal pain, diarrhea and vomiting.  Endocrine: Negative for hot flashes.  Genitourinary:  Negative for difficulty urinating.   Musculoskeletal:  Negative for arthralgias.  Skin:  Negative for itching and rash.  Neurological:  Negative for dizziness, extremity weakness, headaches and numbness.  Hematological:  Negative for adenopathy. Does not bruise/bleed easily.  Psychiatric/Behavioral:  Negative for depression. The patient is not nervous/anxious.       PHYSICAL EXAMINATION  ECOG PERFORMANCE STATUS: 1 - Symptomatic but completely ambulatory  Vitals:   01/01/22 0835  BP: (!) 138/99  Pulse: (!) 120  Resp: 15  Temp: 98.1 F (36.7 C)  SpO2: 100%     Physical Exam Constitutional:      General: She is not in acute distress.    Appearance: Normal appearance. She is not toxic-appearing.  HENT:     Head: Normocephalic and atraumatic.  Eyes:     General: No scleral icterus. Cardiovascular:     Rate and Rhythm: Regular rhythm. Tachycardia present.     Pulses: Normal pulses.     Heart sounds: Normal heart sounds.  Pulmonary:     Effort: Pulmonary effort is normal.     Breath sounds: Normal breath sounds.  Abdominal:     General: Abdomen is flat. Bowel sounds are normal. There is no distension.     Palpations: Abdomen is soft.     Tenderness: There is no abdominal tenderness.   Musculoskeletal:        General: Swelling (Mild ankle swelling bilaterally) present.     Cervical back: Neck supple.  Lymphadenopathy:     Cervical: No cervical adenopathy.  Skin:    General: Skin is warm and dry.     Findings: No rash.  Neurological:     General: No focal deficit present.     Mental Status: She is alert.  Psychiatric:        Mood and Affect: Mood normal.        Behavior: Behavior normal.     LABORATORY DATA:  CBC    Component Value Date/Time   WBC 9.6 01/01/2022 0818   RBC 3.84 (L) 01/01/2022 0818   HGB 11.6 (L) 01/01/2022 0818   HGB 15.9 10/30/2019 1030   HGB 14.7 02/15/2017 0852   HCT 34.7 (L) 01/01/2022 0818   HCT 46.1 10/30/2019 1030   HCT 42.9 02/15/2017 0852   PLT 300 01/01/2022 0818   PLT 245 10/30/2019 1030   MCV 90.4 01/01/2022 0818   MCV 88 10/30/2019 1030   MCV 86.7 02/15/2017 0852   MCH 30.2 01/01/2022 0818   MCHC 33.4 01/01/2022 0818   RDW 18.2 (H) 01/01/2022 0818   RDW 12.5 10/30/2019 1030   RDW 12.6 02/15/2017 0852   LYMPHSABS 0.6 (L) 01/01/2022 0818   LYMPHSABS 1.4 02/15/2017 0852   MONOABS 0.4 01/01/2022 0818   MONOABS 0.3 02/15/2017 0852   EOSABS 0.0 01/01/2022 0818   EOSABS 0.1 02/15/2017 0852   BASOSABS 0.0 01/01/2022 0818   BASOSABS 0.0 02/15/2017 0852    CMP     Component Value Date/Time   NA 143 01/01/2022 0818   NA 140 10/30/2019 1030   NA 141 02/15/2017 0852   K 3.4 (L) 01/01/2022 0818   K 3.9 02/15/2017 0852   CL 108 01/01/2022 0818   CL 101 11/15/2012 0904   CO2 25 01/01/2022 0818   CO2 25 02/15/2017 0852   GLUCOSE 152 (H) 01/01/2022 0818   GLUCOSE 87 02/15/2017 0852   GLUCOSE 69 (L) 11/15/2012 0904   BUN 14 01/01/2022 0818   BUN 11 10/30/2019 1030   BUN 12.5 02/15/2017 0852   CREATININE 0.68 01/01/2022 0818   CREATININE 0.9 02/15/2017 0852   CALCIUM 9.7 01/01/2022 0818  CALCIUM 9.3 02/15/2017 0852   PROT 6.2 (L) 01/01/2022 0818   PROT 6.7 10/30/2019 1030   PROT 7.0 02/15/2017 0852   ALBUMIN 4.0  01/01/2022 0818   ALBUMIN 4.2 10/30/2019 1030   ALBUMIN 3.6 02/15/2017 0852   AST 16 01/01/2022 0818   AST 16 02/15/2017 0852   ALT 16 01/01/2022 0818   ALT 13 02/15/2017 0852   ALKPHOS 65 01/01/2022 0818   ALKPHOS 63 02/15/2017 0852   BILITOT 0.4 01/01/2022 0818   BILITOT 0.5 10/30/2019 1030   BILITOT 0.69 02/15/2017 0852   GFRNONAA >60 01/01/2022 0818   GFRAA 100 10/30/2019 1030     ASSESSMENT and THERAPY PLAN:  Sherry Barry is a 51 y.o. female who returns for a follow-up for stage IV triple positive breast cancer.  #Stage IV triple positive breast cancer: --Currently treatment includes Taxotere, Herceptin and Perjeta. --CT imaging on 12/05/2018 3:23 cycles showed no new evidence of metastatic disease, decrease in left pleural effusion suggesting resolving pleural metastatic disease few second scattered sclerotic metastatic lesions which have not changed significantly.  Overall these findings are consistent with response.   -- Patient is due for cycle 5 of Taxotere, Herceptin and Perjeta.  From today were reviewed and adequate for treatment today.  Patient will proceed with treatment as planned.  We received dental clearance so we will plan to start Zometa q 12 weeks.   #Nausea: --Secondary to chemotherapy, well controlled with prescribed antiemetics.  --Continue to monitor.   #Tachycardia:  --HR is 116 today. Patient is clinically stable.  --She is scheduled for a follow up with Dr. Aundra Dubin from cardiology on 01/11/2021 and repeat echo.   #GERD: --Sent prescription for protonix 40 mg nightly  #Hypokalemia: --Mild decrease in potassium levels over the last several weeks.  --Today's potassium is 3.4.  --Sent prescription for potassium chloride 20 mEq once a day  Follow up: RTC on 01/22/2022 for labs and f/u visit with Dr. Chryl Heck before cycle 6 of taxotere/herceptin/perjeta.   All questions were answered. The patient knows to call the clinic with any problems,  questions or concerns. We can certainly see the patient much sooner if necessary.  I have spent a total of 30 minutes minutes of face-to-face and non-face-to-face time, preparing to see the [facility] a medically appropriate examination, counseling and educating the patient, ordering medications/tests, documenting clinical information in the electronic health record,  and care coordination.   Dede Query PA-C Dept of Hematology and [place] at [facility] Phone: [phone]

## 2022-01-01 NOTE — Progress Notes (Signed)
Zometa not given on 01/01/2022 due to no dental clearance on file.  Per Dede Query, okay to wait until next scheduled treatment to give Zometa if dental clearance received.

## 2022-01-01 NOTE — Patient Instructions (Addendum)
[facility]  Discharge Instructions: Thank you for choosing [facility] to provide your oncology and hematology care.   If you have a lab appointment with the [facility], please go directly to the [facility] and check in at the registration area.   Wear comfortable clothing and clothing appropriate for easy access to any Portacath or PICC line.   We strive to give you quality time with your provider. You may need to reschedule your appointment if you arrive late (15 or more minutes).  Arriving late affects you and other patients whose appointments are after yours.  Also, if you miss three or more appointments without notifying the office, you may be dismissed from the clinic at the provider's discretion.      For prescription refill requests, have your pharmacy contact our office and allow 72 hours for refills to be completed.    Today you received the following chemotherapy and/or immunotherapy agents Taxotere, Perjeta, Herceptin.      To help prevent nausea and vomiting after your treatment, we encourage you to take your nausea medication as directed.  BELOW ARE SYMPTOMS THAT SHOULD BE REPORTED IMMEDIATELY: *FEVER GREATER THAN 100.4 F (38 C) OR HIGHER *CHILLS OR SWEATING *NAUSEA AND VOMITING THAT IS NOT CONTROLLED WITH YOUR NAUSEA MEDICATION *UNUSUAL SHORTNESS OF BREATH *UNUSUAL BRUISING OR BLEEDING *URINARY PROBLEMS (pain or burning when urinating, or frequent urination) *BOWEL PROBLEMS (unusual diarrhea, constipation, pain near the anus) TENDERNESS IN MOUTH AND THROAT WITH OR WITHOUT PRESENCE OF ULCERS (sore throat, sores in mouth, or a toothache) UNUSUAL RASH, SWELLING OR PAIN  UNUSUAL VAGINAL DISCHARGE OR ITCHING   Items with * indicate a potential emergency and should be followed up as soon as possible or go to the Emergency Department if any problems should occur.  Please show the CHEMOTHERAPY ALERT CARD or IMMUNOTHERAPY ALERT  CARD at check-in to the Emergency Department and triage nurse.  Should you have questions after your visit or need to cancel or reschedule your appointment, please contact [facility]  Dept: [phone]  and follow the prompts.  Office hours are 8:00 a.m. to 4:30 p.m. Monday - Friday. Please note that voicemails left after 4:00 p.m. may not be returned until the following business day.  We are closed weekends and major holidays. You have access to a nurse at all times for urgent questions. Please call the main number to the clinic Dept: [phone] and follow the prompts.   For any non-urgent questions, you may also contact your provider using MyChart. We now offer e-Visits for anyone 72 and older to request care online for non-urgent symptoms. For details visit [email or web address].   Also download the MyChart app! Go to the app store, search "MyChart", open the app, select [facility], and log in with your MyChart username and password.  Masks are optional in the cancer centers. If you would like for your care team to wear a mask while they are taking care of you, please let them know. For doctor visits, patients may have with them one support person who is at least 49 years old. At this time, visitors are not allowed in the infusion area. Docetaxel injection What is this medication? DOCETAXEL (doe se TAX el) is a chemotherapy drug. It targets fast dividing cells, like cancer cells, and causes these cells to die. This medicine is used to treat many types of cancers like breast cancer, certain stomach cancers, head and neck cancer,  lung cancer, and prostate cancer. This medicine may be used for other purposes; ask your health care provider or pharmacist if you have questions. COMMON BRAND NAME(S): Docefrez, Taxotere What should I tell my care team before I take this medication? They need to know if you have any of these conditions: infection (especially a virus  infection such as chickenpox, cold sores, or herpes) liver disease low blood counts, like low white cell, platelet, or red cell counts an unusual or allergic reaction to docetaxel, polysorbate 80, other chemotherapy agents, other medicines, foods, dyes, or preservatives pregnant or trying to get pregnant breast-feeding How should I use this medication? This drug is given as an infusion into a vein. It is administered in a hospital or clinic by a specially trained health care professional. Talk to your pediatrician regarding the use of this medicine in children. Special care may be needed. Overdosage: If you think you have taken too much of this medicine contact a poison control center or emergency room at once. NOTE: This medicine is only for you. Do not share this medicine with others. What if I miss a dose? It is important not to miss your dose. Call your doctor or health care professional if you are unable to keep an appointment. What may interact with this medication? Do not take this medicine with any of the following medications: live virus vaccines This medicine may also interact with the following medications: aprepitant certain antibiotics like erythromycin or clarithromycin certain antivirals for HIV or hepatitis certain medicines for fungal infections like fluconazole, itraconazole, ketoconazole, posaconazole, or voriconazole cimetidine ciprofloxacin conivaptan cyclosporine dronedarone fluvoxamine grapefruit juice imatinib verapamil This list may not describe all possible interactions. Give your health care provider a list of all the medicines, herbs, non-prescription drugs, or dietary supplements you use. Also tell them if you smoke, drink alcohol, or use illegal drugs. Some items may interact with your medicine. What should I watch for while using this medication? Your condition will be monitored carefully while you are receiving this medicine. You will need important  blood work done while you are taking this medicine. Call your doctor or health care professional for advice if you get a fever, chills or sore throat, or other symptoms of a cold or flu. Do not treat yourself. This drug decreases your body's ability to fight infections. Try to avoid being around people who are sick. Some products may contain alcohol. Ask your health care professional if this medicine contains alcohol. Be sure to tell all health care professionals you are taking this medicine. Certain medicines, like metronidazole and disulfiram, can cause an unpleasant reaction when taken with alcohol. The reaction includes flushing, headache, nausea, vomiting, sweating, and increased thirst. The reaction can last from 30 minutes to several hours. You may get drowsy or dizzy. Do not drive, use machinery, or do anything that needs mental alertness until you know how this medicine affects you. Do not stand or sit up quickly, especially if you are an older patient. This reduces the risk of dizzy or fainting spells. Alcohol may interfere with the effect of this medicine. Talk to your health care professional about your risk of cancer. You may be more at risk for certain types of cancer if you take this medicine. Do not become pregnant while taking this medicine or for 6 months after stopping it. Women should inform their doctor if they wish to become pregnant or think they might be pregnant. There is a potential for serious side effects to  an unborn child. Talk to your health care professional or pharmacist for more information. Do not breast-feed an infant while taking this medicine or for 1 week after stopping it. Males who get this medicine must use a condom during sex with females who can get pregnant. If you get a woman pregnant, the baby could have birth defects. The baby could die before they are born. You will need to continue wearing a condom for 3 months after stopping the medicine. Tell your health care  provider right away if your partner becomes pregnant while you are taking this medicine. This may interfere with the ability to father a child. You should talk to your doctor or health care professional if you are concerned about your fertility. What side effects may I notice from receiving this medication? Side effects that you should report to your doctor or health care professional as soon as possible: allergic reactions like skin rash, itching or hives, swelling of the face, lips, or tongue blurred vision breathing problems changes in vision low blood counts - This drug may decrease the number of white blood cells, red blood cells and platelets. You may be at increased risk for infections and bleeding. nausea and vomiting pain, redness or irritation at site where injected pain, tingling, numbness in the hands or feet redness, blistering, peeling, or loosening of the skin, including inside the mouth signs of decreased platelets or bleeding - bruising, pinpoint red spots on the skin, black, tarry stools, nosebleeds signs of decreased red blood cells - unusually weak or tired, fainting spells, lightheadedness signs of infection - fever or chills, cough, sore throat, pain or difficulty passing urine swelling of the ankle, feet, hands Side effects that usually do not require medical attention (report to your doctor or health care professional if they continue or are bothersome): constipation diarrhea fingernail or toenail changes hair loss loss of appetite mouth sores muscle pain This list may not describe all possible side effects. Call your doctor for medical advice about side effects. You may report side effects to FDA at 1-800-FDA-1088. Where should I keep my medication? This drug is given in a hospital or clinic and will not be stored at home. NOTE: This sheet is a summary. It may not cover all possible information. If you have questions about this medicine, talk to your doctor,  pharmacist, or health care provider.  2023 Elsevier/Gold Standard (2021-05-01 00:00:00) Pertuzumab injection What is this medication? PERTUZUMAB (per TOOZ ue mab) is a monoclonal antibody. It is used to treat breast cancer. This medicine may be used for other purposes; ask your health care provider or pharmacist if you have questions. COMMON BRAND NAME(S): PERJETA What should I tell my care team before I take this medication? They need to know if you have any of these conditions: heart disease heart failure high blood pressure history of irregular heart beat recent or ongoing radiation therapy an unusual or allergic reaction to pertuzumab, other medicines, foods, dyes, or preservatives pregnant or trying to get pregnant breast-feeding How should I use this medication? This medicine is for infusion into a vein. It is given by a health care professional in a hospital or clinic setting. Talk to your pediatrician regarding the use of this medicine in children. Special care may be needed. Overdosage: If you think you have taken too much of this medicine contact a poison control center or emergency room at once. NOTE: This medicine is only for you. Do not share this medicine with others. What  if I miss a dose? It is important not to miss your dose. Call your doctor or health care professional if you are unable to keep an appointment. What may interact with this medication? Interactions are not expected. Give your health care provider a list of all the medicines, herbs, non-prescription drugs, or dietary supplements you use. Also tell them if you smoke, drink alcohol, or use illegal drugs. Some items may interact with your medicine. This list may not describe all possible interactions. Give your health care provider a list of all the medicines, herbs, non-prescription drugs, or dietary supplements you use. Also tell them if you smoke, drink alcohol, or use illegal drugs. Some items may interact  with your medicine. What should I watch for while using this medication? Your condition will be monitored carefully while you are receiving this medicine. Report any side effects. Continue your course of treatment even though you feel ill unless your doctor tells you to stop. Do not become pregnant while taking this medicine or for 7 months after stopping it. Women should inform their doctor if they wish to become pregnant or think they might be pregnant. Women of child-bearing potential will need to have a negative pregnancy test before starting this medicine. There is a potential for serious side effects to an unborn child. Talk to your health care professional or pharmacist for more information. Do not breast-feed an infant while taking this medicine or for 7 months after stopping it. Women must use effective birth control with this medicine. Call your doctor or health care professional for advice if you get a fever, chills or sore throat, or other symptoms of a cold or flu. Do not treat yourself. Try to avoid being around people who are sick. You may experience fever, chills, and headache during the infusion. Report any side effects during the infusion to your health care professional. What side effects may I notice from receiving this medication? Side effects that you should report to your doctor or health care professional as soon as possible: breathing problems chest pain or palpitations dizziness feeling faint or lightheaded fever or chills skin rash, itching or hives sore throat swelling of the face, lips, or tongue swelling of the legs or ankles unusually weak or tired Side effects that usually do not require medical attention (report to your doctor or health care professional if they continue or are bothersome): diarrhea hair loss nausea, vomiting tiredness This list may not describe all possible side effects. Call your doctor for medical advice about side effects. You may report  side effects to FDA at 1-800-FDA-1088. Where should I keep my medication? This drug is given in a hospital or clinic and will not be stored at home. NOTE: This sheet is a summary. It may not cover all possible information. If you have questions about this medicine, talk to your doctor, pharmacist, or health care provider.  2023 Elsevier/Gold Standard (2015-07-03 00:00:00) Trastuzumab injection for infusion What is this medication? TRASTUZUMAB (tras TOO zoo mab) is a monoclonal antibody. It is used to treat breast cancer and stomach cancer. This medicine may be used for other purposes; ask your health care provider or pharmacist if you have questions. COMMON BRAND NAME(S): Herceptin, Janae Bridgeman, Ontruzant, Trazimera What should I tell my care team before I take this medication? They need to know if you have any of these conditions: heart disease heart failure lung or breathing disease, like asthma an unusual or allergic reaction to trastuzumab, benzyl alcohol, or other  medications, foods, dyes, or preservatives pregnant or trying to get pregnant breast-feeding How should I use this medication? This drug is given as an infusion into a vein. It is administered in a hospital or clinic by a specially trained health care professional. Talk to your pediatrician regarding the use of this medicine in children. This medicine is not approved for use in children. Overdosage: If you think you have taken too much of this medicine contact a poison control center or emergency room at once. NOTE: This medicine is only for you. Do not share this medicine with others. What if I miss a dose? It is important not to miss a dose. Call your doctor or health care professional if you are unable to keep an appointment. What may interact with this medication? This medicine may interact with the following medications: certain types of chemotherapy, such as daunorubicin, doxorubicin, epirubicin, and  idarubicin This list may not describe all possible interactions. Give your health care provider a list of all the medicines, herbs, non-prescription drugs, or dietary supplements you use. Also tell them if you smoke, drink alcohol, or use illegal drugs. Some items may interact with your medicine. What should I watch for while using this medication? Visit your doctor for checks on your progress. Report any side effects. Continue your course of treatment even though you feel ill unless your doctor tells you to stop. Call your doctor or health care professional for advice if you get a fever, chills or sore throat, or other symptoms of a cold or flu. Do not treat yourself. Try to avoid being around people who are sick. You may experience fever, chills and shaking during your first infusion. These effects are usually mild and can be treated with other medicines. Report any side effects during the infusion to your health care professional. Fever and chills usually do not happen with later infusions. Do not become pregnant while taking this medicine or for 7 months after stopping it. Women should inform their doctor if they wish to become pregnant or think they might be pregnant. Women of child-bearing potential will need to have a negative pregnancy test before starting this medicine. There is a potential for serious side effects to an unborn child. Talk to your health care professional or pharmacist for more information. Do not breast-feed an infant while taking this medicine or for 7 months after stopping it. Women must use effective birth control with this medicine. What side effects may I notice from receiving this medication? Side effects that you should report to your doctor or health care professional as soon as possible: allergic reactions like skin rash, itching or hives, swelling of the face, lips, or tongue chest pain or palpitations cough dizziness feeling faint or lightheaded,  falls fever general ill feeling or flu-like symptoms signs of worsening heart failure like breathing problems; swelling in your legs and feet unusually weak or tired Side effects that usually do not require medical attention (report to your doctor or health care professional if they continue or are bothersome): bone pain changes in taste diarrhea joint pain nausea/vomiting weight loss This list may not describe all possible side effects. Call your doctor for medical advice about side effects. You may report side effects to FDA at 1-800-FDA-1088. Where should I keep my medication? This drug is given in a hospital or clinic and will not be stored at home. NOTE: This sheet is a summary. It may not cover all possible information. If you have questions about this  medicine, talk to your doctor, pharmacist, or health care provider.  2023 Elsevier/Gold Standard (2016-06-15 00:00:00)

## 2022-01-04 ENCOUNTER — Other Ambulatory Visit: Payer: Self-pay

## 2022-01-04 ENCOUNTER — Inpatient Hospital Stay: Payer: Federal, State, Local not specified - PPO

## 2022-01-04 VITALS — BP 126/96 | HR 102 | Temp 98.9°F | Resp 16

## 2022-01-04 DIAGNOSIS — Z9013 Acquired absence of bilateral breasts and nipples: Secondary | ICD-10-CM | POA: Diagnosis not present

## 2022-01-04 DIAGNOSIS — Z170 Estrogen receptor positive status [ER+]: Secondary | ICD-10-CM | POA: Diagnosis not present

## 2022-01-04 DIAGNOSIS — C50211 Malignant neoplasm of upper-inner quadrant of right female breast: Secondary | ICD-10-CM | POA: Diagnosis not present

## 2022-01-04 DIAGNOSIS — Z5112 Encounter for antineoplastic immunotherapy: Secondary | ICD-10-CM | POA: Diagnosis not present

## 2022-01-04 DIAGNOSIS — Z5189 Encounter for other specified aftercare: Secondary | ICD-10-CM | POA: Diagnosis not present

## 2022-01-04 DIAGNOSIS — K219 Gastro-esophageal reflux disease without esophagitis: Secondary | ICD-10-CM | POA: Diagnosis not present

## 2022-01-04 DIAGNOSIS — C7951 Secondary malignant neoplasm of bone: Secondary | ICD-10-CM | POA: Diagnosis not present

## 2022-01-04 DIAGNOSIS — R000 Tachycardia, unspecified: Secondary | ICD-10-CM | POA: Diagnosis not present

## 2022-01-04 DIAGNOSIS — E876 Hypokalemia: Secondary | ICD-10-CM | POA: Diagnosis not present

## 2022-01-04 DIAGNOSIS — Z923 Personal history of irradiation: Secondary | ICD-10-CM | POA: Diagnosis not present

## 2022-01-04 DIAGNOSIS — R110 Nausea: Secondary | ICD-10-CM | POA: Diagnosis not present

## 2022-01-04 DIAGNOSIS — Z5111 Encounter for antineoplastic chemotherapy: Secondary | ICD-10-CM | POA: Diagnosis not present

## 2022-01-04 MED ORDER — PEGFILGRASTIM-CBQV 6 MG/0.6ML ~~LOC~~ SOSY
6.0000 mg | PREFILLED_SYRINGE | Freq: Once | SUBCUTANEOUS | Status: AC
  Administered 2022-01-04: 6 mg via SUBCUTANEOUS
  Filled 2022-01-04: qty 0.6

## 2022-01-04 NOTE — Patient Instructions (Signed)

## 2022-01-05 ENCOUNTER — Telehealth: Payer: Self-pay | Admitting: *Deleted

## 2022-01-05 NOTE — Telephone Encounter (Signed)
Received via fax Dental Clearance stating pt is receiving regular dental care, with ok to proceed with bisphosphonate therapy -per Dr Antionette Fairy at [facility].

## 2022-01-07 ENCOUNTER — Other Ambulatory Visit: Payer: Self-pay | Admitting: Hematology and Oncology

## 2022-01-07 ENCOUNTER — Encounter: Payer: Self-pay | Admitting: Hematology and Oncology

## 2022-01-07 NOTE — Progress Notes (Signed)
Dental clearance received Ok to proceed with zometa.  Sherry Barry

## 2022-01-11 ENCOUNTER — Ambulatory Visit (HOSPITAL_COMMUNITY)
Admission: RE | Admit: 2022-01-11 | Discharge: 2022-01-11 | Disposition: A | Discharge status: Home / Self Care | Payer: Federal, State, Local not specified - PPO | Source: Ambulatory Visit | Attending: Cardiology | Admitting: Cardiology

## 2022-01-11 ENCOUNTER — Ambulatory Visit (HOSPITAL_BASED_OUTPATIENT_CLINIC_OR_DEPARTMENT_OTHER)
Admission: RE | Admit: 2022-01-11 | Discharge: 2022-01-11 | Disposition: A | Discharge status: Home / Self Care | Payer: Federal, State, Local not specified - PPO | Source: Ambulatory Visit | Attending: Cardiology | Admitting: Cardiology

## 2022-01-11 ENCOUNTER — Encounter (HOSPITAL_COMMUNITY): Payer: Self-pay | Admitting: Cardiology

## 2022-01-11 VITALS — BP 120/78 | HR 96 | Wt 154.4 lb

## 2022-01-11 DIAGNOSIS — Z170 Estrogen receptor positive status [ER+]: Secondary | ICD-10-CM | POA: Insufficient documentation

## 2022-01-11 DIAGNOSIS — C50211 Malignant neoplasm of upper-inner quadrant of right female breast: Secondary | ICD-10-CM

## 2022-01-11 DIAGNOSIS — I509 Heart failure, unspecified: Secondary | ICD-10-CM | POA: Insufficient documentation

## 2022-01-11 DIAGNOSIS — I110 Hypertensive heart disease with heart failure: Secondary | ICD-10-CM | POA: Diagnosis not present

## 2022-01-11 DIAGNOSIS — Z01818 Encounter for other preprocedural examination: Secondary | ICD-10-CM | POA: Diagnosis not present

## 2022-01-11 DIAGNOSIS — Z0189 Encounter for other specified special examinations: Secondary | ICD-10-CM

## 2022-01-11 LAB — ECHOCARDIOGRAM COMPLETE
Area-P 1/2: 4.17 cm2
S' Lateral: 2.3 cm

## 2022-01-11 NOTE — Progress Notes (Signed)
Oncology: Dr. Chryl Heck  51 y.o. with history of stage IV triple positive breast cancer presents for cardio-oncology evaluation.  Initial diagnosis in 1/14.  Bilateral mastectomy, ER+/PR+/HER2+.  She was treated with carboplatin/docetaxel/trastuzumab x 6 cycles then trastuzumab alone to complete 1 year.  She had radiation as well. In 4/23, patient was found to have recurrence with left malignant pleural effusion. She has completed chemo with docetaxel/trastuzumab/pertuzumab and will continue trastuzumab x 1 year.    Echo was done today and reviewed, EF 65-70% with GLS -21.3%.   Patient did not have cardiac complications from her initial chemotherapy involving trastuzumab in 2014.  She does have treated HTN.  No family history of cardiomyopathy or early CAD.  Nonsmoker.  No exertional dyspnea or chest pain.  Overall, feeling reasonably well.   ECG (personally reviewed): NSR rate 91, normal.   Labs (5/23): K 3.3, creatinine 0.64  PMH: 1. HTN 2. Migraines 3. Breast cancer: Initial diagnosis in 1/14.  Bilateral mastectomy, ER+/PR+/HER2+.  She was treated with carboplatin/docetaxel/trastuzumab x 6 cycles then trastuzumab alone to complete 1 year.  She had radiation.  - 4/23 patient had recurrence with left malignant pleural effusion found. She has started chemo with docetaxel/trastuzumab/pertuzumab.  - Echo (4/23): EF 60-65%, GLS -16.8%, RV normal.  - Echo (7/23): EF 65-70%, GLS -21.3%, RV normal.   Social History   Socioeconomic History   Marital status: Married    Spouse name: Not on file   Number of children: 2   Years of education: Not on file   Highest education level: Not on file  Occupational History    Employer: [employer]  Tobacco Use   Smoking status: Never   Smokeless tobacco: Never  Substance and Sexual Activity   Alcohol use: Yes    Comment: occasional   Drug use: No   Sexual activity: Yes    Birth control/protection: Surgical  Other Topics Concern   Not on file   Social History Narrative   Not on file   Social Determinants of Health   Financial Resource Strain: Not on file  Food Insecurity: Not on file  Transportation Needs: Not on file  Physical Activity: Not on file  Stress: Not on file  Social Connections: Not on file  Intimate Partner Violence: Not on file   Family History  Problem Relation Age of Onset   Hypertension Mother    Alcohol abuse Mother    Heart disease Maternal Grandmother    Stroke Maternal Grandfather    Colon cancer Maternal Aunt 10       alive at 62   Brain cancer Maternal Uncle 73       and lymphoma in early 23s; deceased   Brain cancer Maternal Uncle 60       deceased   Pancreatic cancer Maternal Uncle 5       alive at 32   Breast cancer Cousin 73       mat 1st cousin once removed through mat GF ; deceased   Breast cancer Maternal Aunt        great aunt through mat GF; dx at ? age   ROS: All systems reviewed and negative except as per HPI.   Current Outpatient Medications  Medication Sig Dispense Refill   acetaminophen (TYLENOL) 500 MG tablet Take 1,000 mg by mouth every 6 (six) hours as needed for moderate pain.     amLODipine (NORVASC) 10 MG tablet Take 1 tablet (10 mg total) by mouth every morning. 30 tablet  3   dexamethasone (DECADRON) 4 MG tablet Take 2 tablets (8 mg total) by mouth 2 (two) times daily.  Start the day before Taxotere, then daily after chemo for 2 days 30 tablet 1   lidocaine-prilocaine (EMLA) cream Apply 1 application. topically as needed. 30 g 0   LORazepam (ATIVAN) 0.5 MG tablet Take 1 tablet (0.5 mg total) by mouth every 6 (six) hours as needed for anxiety. 30 tablet 0   ondansetron (ZOFRAN) 8 MG tablet Take 1 tablet (8 mg total) by mouth every 8 (eight) hours as needed for nausea or vomiting. 20 tablet 0   pantoprazole (PROTONIX) 40 MG tablet Take 1 tablet (40 mg total) by mouth at bedtime. 30 tablet 3   potassium chloride SA (KLOR-CON M) 20 MEQ tablet Take 1 tablet (20 mEq total)  by mouth daily. 30 tablet 0   prochlorperazine (COMPAZINE) 10 MG tablet Take 1 tablet (10 mg total) by mouth every 6 (six) hours as needed for nausea or vomiting. 30 tablet 0   No current facility-administered medications for this encounter.   BP 120/78   Pulse 96   Wt 70 kg (154 lb 6.4 oz)   SpO2 98%   BMI 28.24 kg/m  General: NAD Neck: No JVD, no thyromegaly or thyroid nodule.  Lungs: Clear to auscultation bilaterally with normal respiratory effort. CV: Nondisplaced PMI.  Heart regular S1/S2, no S3/S4, no murmur.  No peripheral edema.  No carotid bruit.  Normal pedal pulses.  Abdomen: Soft, nontender, no hepatosplenomegaly, no distention.  Skin: Intact without lesions or rashes.  Neurologic: Alert and oriented x 3.  Psych: Normal affect. Extremities: No clubbing or cyanosis.  HEENT: Normal.   Assessment/Plan: 1. Stage IV triple positive breast cancer: Patient had trastuzumab as part of her therapy back in 2014 with no cardiac complications.  She has started trastuzumab-based therapy again.  I reviewed today's echo, it appeared normal.  We have discussed the possible cardio-toxicity of trastuzumab and the reasoning behind serial echoes.   - I will obtain repeat echo in 3 months.  2. HTN: BP controlled on current amlodipine, continue.   Loralie Champagne 01/11/2022

## 2022-01-11 NOTE — Patient Instructions (Signed)
There has been no changes to your medications.  Your physician has requested that you have an echocardiogram. Echocardiography is a painless test that uses sound waves to create images of your heart. It provides your doctor with information about the size and shape of your heart and how well your heart's chambers and valves are working. This procedure takes approximately one hour. There are no restrictions for this procedure.  Your physician recommends that you schedule a follow-up appointment in: 3 months   If you have any questions or concerns before your next appointment please send [place] a message through [place] or call our office at [phone].    TO LEAVE A MESSAGE FOR THE NURSE SELECT OPTION 2, PLEASE LEAVE A MESSAGE INCLUDING: YOUR NAME DATE OF BIRTH CALL BACK NUMBER REASON FOR CALL**this is important as we prioritize the call backs  YOU WILL RECEIVE A CALL BACK THE SAME DAY AS LONG AS YOU CALL BEFORE 4:00 PM  At the [facility], you and your health needs are our priority. As part of our continuing mission to provide you with exceptional heart care, we have created designated Provider Care Teams. These Care Teams include your primary Cardiologist (physician) and Advanced Practice Providers (APPs- Physician Assistants and Nurse Practitioners) who all work together to provide you with the care you need, when you need it.   You may see any of the following providers on your designated Care Team at your next follow up: Dr Glori Bickers Dr Haynes Kerns, NP Lyda Jester, [place] [facility] [place], [place] Audry Riles, PharmD   Please be sure to bring in all your medications bottles to every appointment.

## 2022-01-14 ENCOUNTER — Inpatient Hospital Stay: Payer: Federal, State, Local not specified - PPO | Attending: Hematology and Oncology

## 2022-01-14 ENCOUNTER — Other Ambulatory Visit: Payer: Self-pay

## 2022-01-14 VITALS — BP 128/88 | HR 116 | Temp 98.8°F | Resp 16

## 2022-01-14 DIAGNOSIS — Z9221 Personal history of antineoplastic chemotherapy: Secondary | ICD-10-CM | POA: Diagnosis not present

## 2022-01-14 DIAGNOSIS — Z5111 Encounter for antineoplastic chemotherapy: Secondary | ICD-10-CM | POA: Insufficient documentation

## 2022-01-14 DIAGNOSIS — Z9013 Acquired absence of bilateral breasts and nipples: Secondary | ICD-10-CM | POA: Diagnosis not present

## 2022-01-14 DIAGNOSIS — Z170 Estrogen receptor positive status [ER+]: Secondary | ICD-10-CM | POA: Diagnosis not present

## 2022-01-14 DIAGNOSIS — Z5112 Encounter for antineoplastic immunotherapy: Secondary | ICD-10-CM | POA: Diagnosis not present

## 2022-01-14 DIAGNOSIS — Z5189 Encounter for other specified aftercare: Secondary | ICD-10-CM | POA: Diagnosis not present

## 2022-01-14 DIAGNOSIS — Z923 Personal history of irradiation: Secondary | ICD-10-CM | POA: Diagnosis not present

## 2022-01-14 DIAGNOSIS — C7951 Secondary malignant neoplasm of bone: Secondary | ICD-10-CM | POA: Insufficient documentation

## 2022-01-14 DIAGNOSIS — C50211 Malignant neoplasm of upper-inner quadrant of right female breast: Secondary | ICD-10-CM | POA: Diagnosis not present

## 2022-01-14 MED ORDER — GOSERELIN ACETATE 3.6 MG ~~LOC~~ IMPL
3.6000 mg | DRUG_IMPLANT | Freq: Once | SUBCUTANEOUS | Status: AC
  Administered 2022-01-14: 3.6 mg via SUBCUTANEOUS
  Filled 2022-01-14: qty 3.6

## 2022-01-22 ENCOUNTER — Inpatient Hospital Stay: Payer: Federal, State, Local not specified - PPO

## 2022-01-22 ENCOUNTER — Inpatient Hospital Stay (HOSPITAL_BASED_OUTPATIENT_CLINIC_OR_DEPARTMENT_OTHER): Payer: Federal, State, Local not specified - PPO | Admitting: Hematology and Oncology

## 2022-01-22 ENCOUNTER — Other Ambulatory Visit: Payer: Self-pay

## 2022-01-22 VITALS — BP 139/94 | HR 128 | Temp 98.1°F | Resp 18 | Ht 62.0 in | Wt 160.6 lb

## 2022-01-22 DIAGNOSIS — C50211 Malignant neoplasm of upper-inner quadrant of right female breast: Secondary | ICD-10-CM | POA: Diagnosis not present

## 2022-01-22 DIAGNOSIS — Z9013 Acquired absence of bilateral breasts and nipples: Secondary | ICD-10-CM | POA: Diagnosis not present

## 2022-01-22 DIAGNOSIS — Z923 Personal history of irradiation: Secondary | ICD-10-CM | POA: Diagnosis not present

## 2022-01-22 DIAGNOSIS — Z170 Estrogen receptor positive status [ER+]: Secondary | ICD-10-CM | POA: Diagnosis not present

## 2022-01-22 DIAGNOSIS — Z5111 Encounter for antineoplastic chemotherapy: Secondary | ICD-10-CM | POA: Diagnosis not present

## 2022-01-22 DIAGNOSIS — T451X5A Adverse effect of antineoplastic and immunosuppressive drugs, initial encounter: Secondary | ICD-10-CM

## 2022-01-22 DIAGNOSIS — C50919 Malignant neoplasm of unspecified site of unspecified female breast: Secondary | ICD-10-CM

## 2022-01-22 DIAGNOSIS — R000 Tachycardia, unspecified: Secondary | ICD-10-CM

## 2022-01-22 DIAGNOSIS — C7951 Secondary malignant neoplasm of bone: Secondary | ICD-10-CM | POA: Diagnosis not present

## 2022-01-22 DIAGNOSIS — R110 Nausea: Secondary | ICD-10-CM | POA: Diagnosis not present

## 2022-01-22 DIAGNOSIS — Z5112 Encounter for antineoplastic immunotherapy: Secondary | ICD-10-CM | POA: Diagnosis not present

## 2022-01-22 DIAGNOSIS — Z9221 Personal history of antineoplastic chemotherapy: Secondary | ICD-10-CM | POA: Diagnosis not present

## 2022-01-22 DIAGNOSIS — Z5189 Encounter for other specified aftercare: Secondary | ICD-10-CM | POA: Diagnosis not present

## 2022-01-22 LAB — COMPREHENSIVE METABOLIC PANEL
ALT: 12 U/L (ref 0–44)
AST: 13 U/L — ABNORMAL LOW (ref 15–41)
Albumin: 4.2 g/dL (ref 3.5–5.0)
Alkaline Phosphatase: 72 U/L (ref 38–126)
Anion gap: 7 (ref 5–15)
BUN: 13 mg/dL (ref 6–20)
CO2: 26 mmol/L (ref 22–32)
Calcium: 9.1 mg/dL (ref 8.9–10.3)
Chloride: 109 mmol/L (ref 98–111)
Creatinine, Ser: 0.78 mg/dL (ref 0.44–1.00)
GFR, Estimated: >60 mL/min (ref >60)
Glucose, Bld: 127 mg/dL — ABNORMAL HIGH (ref 70–99)
Potassium: 3.4 mmol/L — ABNORMAL LOW (ref 3.5–5.1)
Sodium: 142 mmol/L (ref 135–145)
Total Bilirubin: 0.4 mg/dL (ref 0.3–1.2)
Total Protein: 6.4 g/dL — ABNORMAL LOW (ref 6.5–8.1)

## 2022-01-22 LAB — CBC WITH DIFFERENTIAL/PLATELET
Abs Immature Granulocytes: 0.2 10*3/uL — ABNORMAL HIGH (ref 0.00–0.07)
Basophils Absolute: 0 10*3/uL (ref 0.0–0.1)
Basophils Relative: 0 %
Eosinophils Absolute: 0 10*3/uL (ref 0.0–0.5)
Eosinophils Relative: 0 %
HCT: 33.2 % — ABNORMAL LOW (ref 36.0–46.0)
Hemoglobin: 11.3 g/dL — ABNORMAL LOW (ref 12.0–15.0)
Immature Granulocytes: 2 %
Lymphocytes Relative: 7 %
Lymphs Abs: 0.7 10*3/uL (ref 0.7–4.0)
MCH: 30.9 pg (ref 26.0–34.0)
MCHC: 34 g/dL (ref 30.0–36.0)
MCV: 90.7 fL (ref 80.0–100.0)
Monocytes Absolute: 0.7 10*3/uL (ref 0.1–1.0)
Monocytes Relative: 6 %
Neutro Abs: 8.7 10*3/uL — ABNORMAL HIGH (ref 1.7–7.7)
Neutrophils Relative %: 85 %
Platelets: 284 10*3/uL (ref 150–400)
RBC: 3.66 MIL/uL — ABNORMAL LOW (ref 3.87–5.11)
RDW: 17.5 % — ABNORMAL HIGH (ref 11.5–15.5)
WBC: 10.3 10*3/uL (ref 4.0–10.5)
nRBC: 0 % (ref 0.0–0.2)

## 2022-01-22 MED ORDER — TRASTUZUMAB-DKST CHEMO 150 MG IV SOLR
6.0000 mg/kg | Freq: Once | INTRAVENOUS | Status: AC
  Administered 2022-01-22: 399 mg via INTRAVENOUS
  Filled 2022-01-22: qty 19

## 2022-01-22 MED ORDER — SODIUM CHLORIDE 0.9 % IV SOLN: Freq: Once | INTRAVENOUS | Status: AC

## 2022-01-22 MED ORDER — SODIUM CHLORIDE 0.9% FLUSH
10.0000 mL | Freq: Once | INTRAVENOUS | Status: AC
  Administered 2022-01-22: 10 mL

## 2022-01-22 MED ORDER — DIPHENHYDRAMINE HCL 25 MG PO CAPS
50.0000 mg | ORAL_CAPSULE | Freq: Once | ORAL | Status: AC
  Administered 2022-01-22: 50 mg via ORAL

## 2022-01-22 MED ORDER — SODIUM CHLORIDE 0.9 % IV SOLN
10.0000 mg | Freq: Once | INTRAVENOUS | Status: AC
  Administered 2022-01-22: 10 mg via INTRAVENOUS
  Filled 2022-01-22: qty 10

## 2022-01-22 MED ORDER — SODIUM CHLORIDE 0.9 % IV SOLN
75.0000 mg/m2 | Freq: Once | INTRAVENOUS | Status: AC
  Administered 2022-01-22: 130 mg via INTRAVENOUS
  Filled 2022-01-22: qty 13

## 2022-01-22 MED ORDER — ACETAMINOPHEN 325 MG PO TABS
ORAL_TABLET | ORAL | Status: AC
  Filled 2022-01-22: qty 2

## 2022-01-22 MED ORDER — HEPARIN SOD (PORK) LOCK FLUSH 100 UNIT/ML IV SOLN
500.0000 [IU] | Freq: Once | INTRAVENOUS | Status: AC | PRN
  Administered 2022-01-22: 500 [IU]

## 2022-01-22 MED ORDER — SODIUM CHLORIDE 0.9% FLUSH
10.0000 mL | INTRAVENOUS | Status: DC | PRN
  Administered 2022-01-22: 10 mL

## 2022-01-22 MED ORDER — DIPHENHYDRAMINE HCL 25 MG PO CAPS
ORAL_CAPSULE | ORAL | Status: AC
  Filled 2022-01-22: qty 2

## 2022-01-22 MED ORDER — ACETAMINOPHEN 325 MG PO TABS
650.0000 mg | ORAL_TABLET | Freq: Once | ORAL | Status: AC
  Administered 2022-01-22: 650 mg via ORAL

## 2022-01-22 MED ORDER — SODIUM CHLORIDE 0.9 % IV SOLN
420.0000 mg | Freq: Once | INTRAVENOUS | Status: AC
  Administered 2022-01-22: 420 mg via INTRAVENOUS
  Filled 2022-01-22: qty 14

## 2022-01-22 NOTE — Progress Notes (Signed)
[place] [facility] Follow up:    [name], NP [place] [address]   DIAGNOSIS:  Cancer Staging  Malignant neoplasm of upper-inner quadrant of right breast in female, estrogen receptor positive ([place]) Staging form: Breast, AJCC 7th Edition - Clinical: Stage IA (T1c, N0, cM0) - Unsigned Specimen type: Core Needle Biopsy Histopathologic type: 9931 Laterality: Right Staging comments: Staged at breast conference 1.15.14  - Pathologic stage from 07/14/2012: Stage IV ([place], NX, M1) - Signed by [name], MD on 08/28/2021 Specimen type: Core Needle Biopsy Histopathologic type: 9931 Laterality: Right   SUMMARY OF ONCOLOGIC HISTORY: Oncology History  Malignant neoplasm of upper-inner quadrant of right breast in female, estrogen receptor positive ([place])  07/13/2012 Clinical Stage   Stage IA: T1c N0   07/14/2012 Definitive Surgery   Bilateral mastectomy/right SLNB: RIGHT invasive ductal carcinoma, grade 3, ER+, PR +, Her2/neu positive (ratio 3.02), Ki67 48%. DCIS. 1/4 LN positive for malignancy. LEFT: benign   07/14/2012 Pathologic Stage   Stage IIB: T2 N1a M0   07/14/2012 Cancer Staging   Staging form: Breast, AJCC 7th Edition - Pathologic stage from 07/14/2012: Stage IV ([place], NX, M1) - Signed by [name], MD on 08/28/2021 Specimen type: Core Needle Biopsy Histopathologic type: 9931 Laterality: Right   08/17/2012 - 08/30/2013 Chemotherapy   Adjuvant carboplatin, docetaxel, and trastuzumab x 6 cycles (completed 11/30/2012) followed by maintenance trastuzumab to total one year   11/2012 Procedure   Comp Cancer Gene panel (GeneDx) negative for deleterious mutations    - 02/2013 Radiation Therapy   Adjuvant RT to right breast   02/2013 -  Anti-estrogen oral therapy   Tamoxifen 20 mg daily   09/24/2013 Surgery   Bilateral breast reconstruction with latissmus flap and expander placement   12/12/2013 Surgery   Implant placement     Relapse/Recurrence   Left sided malignant pleural effusion.  Tumor cells are positive for GATA3 and ER, negative for TTF-1 consistent with recurrent breast carcinoma Prognostic showed ER 90% positive strong staining, PR 80% positive strong staining, HER2 negative.    09/16/2021 Imaging   PET scan showed malignant left pleural effusion with extensive areas of nodularity and hypermetabolic activity involving the mediastinal border and pericardium as well as the most inferior aspects of the costodiaphragmatic recess.  Signs of nodal disease at the thoracic inlet on the left suspect extension of disease below the diaphragm along the left anterolateral aorta.  Nonspecific moderate to markedly increased metabolic activity about the base of the tongue bilaterally.  Consider direct visualization showing mildly asymmetric uptake favoring the right lingual tonsil.  Sclerotic foci without marked increased metabolic activity suspicious for metastatic disease perhaps treated or questions.  Heterogeneous marrow uptake seen elsewhere generalized and nonspecific.  Consider spinal MRI is warranted  MRI brain without any evidence of intracranial metastatic disease.   10/09/2021 -  Chemotherapy   Patient is on Treatment Plan : BREAST DOCEtaxel + Trastuzumab + Pertuzumab (THP) q21d x 8 cycles / Trastuzumab + Pertuzumab q21d x 4 cycles     12/04/2021 Imaging   There is no evidence new metastatic disease. There is interval decrease in the left pleural effusion possibly suggesting resolving pleural metastatic disease. Few scattered sclerotic metastatic lesions in the skeletal structures have not changed significantly.   No acute findings are seen in the chest abdomen and pelvis.       CURRENT THERAPY: THP  INTERVAL HISTORY:  Wilmary [place] 51 y.o. female returns for follow-up for cycle  6 of THP.  She is accompanied by her husband for this visit.  She feels a little tired, working full time, from home. Mild  nausea without vomiting. She has noted some hypersensitivity in her fingers, no other complaints today Heart burn is better with pantoprazole. Rest of the pertinent 10 point ROS reviewed and negative.  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  MEDICAL HISTORY: Past Medical History:  Diagnosis Date   Allergy    Breast cancer ([place])    Breast cancer ([place])    right   History of chemotherapy    doxetaxel/carboplatin/trastuzumab   History of migraine    last one about a week ago   Hx of radiation therapy 01/01/13- 02/15/13   r chest wall, r supraclav/axilla 5040 cGy/28 sessions, scar boost 1000 cGy/5 sessions   Hypertension    no meds,   urgent care on [place]ana   Migraine    Migraine     SURGICAL HISTORY: Past Surgical History:  Procedure Laterality Date   ABDOMINAL HYSTERECTOMY     no salpingo-oophorectomy 2009   BREAST RECONSTRUCTION WITH PLACEMENT OF TISSUE EXPANDER AND FLEX HD (ACELLULAR HYDRATED DERMIS) Left 09/24/2013   IR IMAGING GUIDED PORT INSERTION  10/07/2021   IR THORACENTESIS ASP PLEURAL SPACE W/IMG GUIDE  08/11/2021   IR THORACENTESIS ASP PLEURAL SPACE W/IMG GUIDE  10/07/2021   LATISSIMUS FLAP TO BREAST Right 09/24/2013   Procedure: RIGHT LATISSMUS MYOCUTAEIOUS  MUSCLE FLAP AND PLACEMENT OF TISSUE [name];  Surgeon: Theodoro Kos, DO;  Location: [facility];  Service: Plastics;  Laterality: Right;   LIPOSUCTION WITH LIPOFILLING Bilateral 12/12/2013   Procedure: LIPOSUCTION WITH LIPOFILLING;  Surgeon: Theodoro Kos, DO;  Location: [facility];  Service: Plastics;  Laterality: Bilateral;   PORT-A-CATH REMOVAL Left 12/12/2013   Procedure: REMOVAL PORT-A-CATH;  Surgeon: Theodoro Kos, DO;  Location: [facility];  Service: Plastics;  Laterality: Left;   PORTACATH PLACEMENT  07/14/2012   Procedure: INSERTION PORT-A-CATH;  Surgeon: Joyice Faster. Cornett, MD;  Location: [facility];  Service: General;  Laterality: Left;   RECONSTRUCTION BREAST W/ LATISSIMUS DORSI FLAP Right 09/24/2013   "& tissue expander placement"   REMOVAL OF BILATERAL TISSUE EXPANDERS WITH PLACEMENT OF BILATERAL BREAST IMPLANTS Bilateral 12/12/2013   Procedure: REMOVAL OF BILATERAL TISSUE EXPANDERS WITH PLACEMENT OF BILATERAL BREAST IMPLANTS/BILATERAL CAPSULECTOMIES WITH  LIPOFILLING FAT GRAFTING;  Surgeon: Theodoro Kos, DO;  Location: [facility];  Service: Plastics;  Laterality: Bilateral;   SIMPLE MASTECTOMY WITH AXILLARY SENTINEL NODE BIOPSY  07/14/2012   Procedure: SIMPLE MASTECTOMY WITH AXILLARY SENTINEL NODE BIOPSY;  Surgeon: Joyice Faster. Cornett, MD;  Location: [facility];  Service: General;  Laterality: Right;  Bilateral simple mastectomy with port and right sebtibel lymph node mapping   SIMPLE MASTECTOMY WITH AXILLARY SENTINEL NODE BIOPSY  07/14/2012   Procedure: SIMPLE MASTECTOMY;  Surgeon: Joyice Faster.  Cornett, MD;  Location: [facility];  Service: General;  Laterality: Left;   TISSUE EXPANDER PLACEMENT Left 09/24/2013   Procedure: PLACEMENT OF TISSUE EXPANDER AND FLEX HD TO LEFT BREAST;  Surgeon: Theodoro Kos, DO;  Location: [facility];  Service: Plastics;  Laterality: Left;    SOCIAL HISTORY: Social History   Socioeconomic History   Marital status: Married    Spouse name: Not  on file   Number of children: 2   Years of education: Not on file   Highest education level: Not on file  Occupational History    Employer: [employer]  Tobacco Use   Smoking status: Never   Smokeless tobacco: Never  Substance and Sexual Activity   Alcohol use: Yes    Comment: occasional   Drug use: No   Sexual activity: Yes    Birth control/protection: Surgical  Other Topics Concern   Not on file  Social History Narrative   Not on file   Social Determinants of Health   Financial Resource Strain: Not on file  Food Insecurity: Not on file  Transportation Needs: Not on file  Physical Activity: Not on file  Stress: Not on file  Social Connections: Not on file  Intimate Partner Violence: Not on file    FAMILY HISTORY: Family History  Problem Relation Age of Onset   Hypertension Mother    Alcohol abuse Mother    Heart disease Maternal Grandmother    Stroke Maternal Grandfather    Colon cancer Maternal Aunt 19       alive at 43   Brain cancer Maternal Uncle 79       and lymphoma in early 40s; deceased   Brain cancer Maternal Uncle 60       deceased   Pancreatic cancer Maternal Uncle 9       alive at 28   Breast cancer Cousin 8       mat 1st cousin once removed through mat GF ; deceased   Breast cancer Maternal Aunt        great aunt through mat GF; dx at ? age    Review of Systems  Constitutional:  Positive for fatigue. Negative for appetite change, chills, fever and unexpected weight change.  HENT:   Negative for hearing loss, lump/mass and trouble swallowing.   Eyes:  Negative for eye problems and icterus.  Respiratory:  Negative for chest tightness and cough.   Cardiovascular:  Negative for chest pain, leg swelling and palpitations.  Gastrointestinal:  Positive for constipation and nausea. Negative for abdominal distention, abdominal pain, diarrhea and vomiting.  Endocrine: Negative for hot flashes.  Genitourinary:  Negative for difficulty urinating.    Musculoskeletal:  Negative for arthralgias.  Skin:  Negative for itching and rash.  Neurological:  Negative for dizziness, extremity weakness, headaches and numbness.  Hematological:  Negative for adenopathy. Does not bruise/bleed easily.  Psychiatric/Behavioral:  Negative for depression. The patient is not nervous/anxious.     PHYSICAL EXAMINATION  ECOG PERFORMANCE STATUS: 1 - Symptomatic but completely ambulatory  Vitals:   01/22/22 1313  BP: (!) 139/94  Pulse: (!) 128  Resp: 18  Temp: 98.1 F (36.7 C)  SpO2: 100%     Physical Exam Constitutional:      General: She is not in acute distress.    Appearance: Normal appearance. She is not toxic-appearing.  HENT:     Head: Normocephalic and atraumatic.  Eyes:     General: No scleral icterus. Cardiovascular:  Rate and Rhythm: Regular rhythm.     Pulses: Normal pulses.     Heart sounds: Normal heart sounds.  Pulmonary:     Effort: Pulmonary effort is normal.     Breath sounds: Normal breath sounds.  Abdominal:     General: Abdomen is flat. Bowel sounds are normal. There is no distension.     Palpations: Abdomen is soft.     Tenderness: There is no abdominal tenderness.  Musculoskeletal:        General: No swelling.     Cervical back: Neck supple.  Lymphadenopathy:     Cervical: No cervical adenopathy.  Skin:    General: Skin is warm and dry.     Findings: No rash.  Neurological:     General: No focal deficit present.     Mental Status: She is alert.  Psychiatric:        Mood and Affect: Mood normal.        Behavior: Behavior normal.     LABORATORY DATA:  CBC    Component Value Date/Time   WBC 10.3 01/22/2022 1251   RBC 3.66 (L) 01/22/2022 1251   HGB 11.3 (L) 01/22/2022 1251   HGB 15.9 10/30/2019 1030   HGB 14.7 02/15/2017 0852   HCT 33.2 (L) 01/22/2022 1251   HCT 46.1 10/30/2019 1030   HCT 42.9 02/15/2017 0852   PLT 284 01/22/2022 1251   PLT 245 10/30/2019 1030   MCV 90.7 01/22/2022 1251   MCV  88 10/30/2019 1030   MCV 86.7 02/15/2017 0852   MCH 30.9 01/22/2022 1251   MCHC 34.0 01/22/2022 1251   RDW 17.5 (H) 01/22/2022 1251   RDW 12.5 10/30/2019 1030   RDW 12.6 02/15/2017 0852   LYMPHSABS 0.7 01/22/2022 1251   LYMPHSABS 1.4 02/15/2017 0852   MONOABS 0.7 01/22/2022 1251   MONOABS 0.3 02/15/2017 0852   EOSABS 0.0 01/22/2022 1251   EOSABS 0.1 02/15/2017 0852   BASOSABS 0.0 01/22/2022 1251   BASOSABS 0.0 02/15/2017 0852    CMP     Component Value Date/Time   NA 143 01/01/2022 0818   NA 140 10/30/2019 1030   NA 141 02/15/2017 0852   K 3.4 (L) 01/01/2022 0818   K 3.9 02/15/2017 0852   CL 108 01/01/2022 0818   CL 101 11/15/2012 0904   CO2 25 01/01/2022 0818   CO2 25 02/15/2017 0852   GLUCOSE 152 (H) 01/01/2022 0818   GLUCOSE 87 02/15/2017 0852   GLUCOSE 69 (L) 11/15/2012 0904   BUN 14 01/01/2022 0818   BUN 11 10/30/2019 1030   BUN 12.5 02/15/2017 0852   CREATININE 0.68 01/01/2022 0818   CREATININE 0.9 02/15/2017 0852   CALCIUM 9.7 01/01/2022 0818   CALCIUM 9.3 02/15/2017 0852   PROT 6.2 (L) 01/01/2022 0818   PROT 6.7 10/30/2019 1030   PROT 7.0 02/15/2017 0852   ALBUMIN 4.0 01/01/2022 0818   ALBUMIN 4.2 10/30/2019 1030   ALBUMIN 3.6 02/15/2017 0852   AST 16 01/01/2022 0818   AST 16 02/15/2017 0852   ALT 16 01/01/2022 0818   ALT 13 02/15/2017 0852   ALKPHOS 65 01/01/2022 0818   ALKPHOS 63 02/15/2017 0852   BILITOT 0.4 01/01/2022 0818   BILITOT 0.5 10/30/2019 1030   BILITOT 0.69 02/15/2017 0852   GFRNONAA >60 01/01/2022 0818   GFRAA 100 10/30/2019 1030     ASSESSMENT and THERAPY PLAN:  Velera Lansdale is a 51 y.o. female who returns for a follow-up for stage IV triple  positive breast cancer.  #Stage IV triple positive breast cancer: --Currently treatment includes Taxotere, Herceptin and Perjeta. --CT imaging on 12/05/2018 3:23 cycles showed no new evidence of metastatic disease, decrease in left pleural effusion suggesting resolving pleural  metastatic disease few second scattered sclerotic metastatic lesions which have not changed significantly.  Overall these findings are consistent with response.   -- Patient is due for cycle 6 of Taxotere, Herceptin and Perjeta.   -- Today we have discussed about continuing chemotherapy for about 8 cycles if well-tolerated followed by anti-HER2 directed therapy --She also continues on ovarian suppression ( She was initially started on AI and ovarian suppression, AI was held during chemotherapy, she continues on ovarian suppression)  #Nausea: --Secondary to chemotherapy, well controlled with prescribed antiemetics.  --Continue to monitor.   #Tachycardia:  She is followed by Dr. Aundra Dubin from cardio oncology.  She will continue trastuzumab and she will repeat serial echocardiograms.  Most recent echo appears satisfactory from oncology standpoint.  #GERD: --Pantoprazole has been working well for her.  Follow up:  She understands that the treatment is indefinite as long as it works for her or as long as there is no unacceptable toxicity. After cycle 8 of chemo, will plan to proceed with Herceptin and pertuzumab alone.  Anticipate repeat imaging at the end of September. Okay to start Zometa today, received dental clearance Return to clinic in 3 weeks

## 2022-01-22 NOTE — Progress Notes (Signed)
Ok to treat with elevated heart rate per Dr. Chryl Heck.

## 2022-01-22 NOTE — Patient Instructions (Signed)
[facility]  Discharge Instructions: Thank you for choosing [facility] to provide your oncology and hematology care.   If you have a lab appointment with the [facility], please go directly to the [facility] and check in at the registration area.   Wear comfortable clothing and clothing appropriate for easy access to any Portacath or PICC line.   We strive to give you quality time with your provider. You may need to reschedule your appointment if you arrive late (15 or more minutes).  Arriving late affects you and other patients whose appointments are after yours.  Also, if you miss three or more appointments without notifying the office, you may be dismissed from the clinic at the provider's discretion.      For prescription refill requests, have your pharmacy contact our office and allow 72 hours for refills to be completed.    Today you received the following chemotherapy and/or immunotherapy agents Taxotere, Perjeta, Herceptin.      To help prevent nausea and vomiting after your treatment, we encourage you to take your nausea medication as directed.  BELOW ARE SYMPTOMS THAT SHOULD BE REPORTED IMMEDIATELY: *FEVER GREATER THAN 100.4 F (38 C) OR HIGHER *CHILLS OR SWEATING *NAUSEA AND VOMITING THAT IS NOT CONTROLLED WITH YOUR NAUSEA MEDICATION *UNUSUAL SHORTNESS OF BREATH *UNUSUAL BRUISING OR BLEEDING *URINARY PROBLEMS (pain or burning when urinating, or frequent urination) *BOWEL PROBLEMS (unusual diarrhea, constipation, pain near the anus) TENDERNESS IN MOUTH AND THROAT WITH OR WITHOUT PRESENCE OF ULCERS (sore throat, sores in mouth, or a toothache) UNUSUAL RASH, SWELLING OR PAIN  UNUSUAL VAGINAL DISCHARGE OR ITCHING   Items with * indicate a potential emergency and should be followed up as soon as possible or go to the Emergency Department if any problems should occur.  Please show the CHEMOTHERAPY ALERT CARD or IMMUNOTHERAPY ALERT  CARD at check-in to the Emergency Department and triage nurse.  Should you have questions after your visit or need to cancel or reschedule your appointment, please contact [facility]  Dept: [phone]  and follow the prompts.  Office hours are 8:00 a.m. to 4:30 p.m. Monday - Friday. Please note that voicemails left after 4:00 p.m. may not be returned until the following business day.  We are closed weekends and major holidays. You have access to a nurse at all times for urgent questions. Please call the main number to the clinic Dept: [phone] and follow the prompts.   For any non-urgent questions, you may also contact your provider using MyChart. We now offer e-Visits for anyone 82 and older to request care online for non-urgent symptoms. For details visit [email or web address].   Also download the MyChart app! Go to the app store, search "MyChart", open the app, select [facility], and log in with your MyChart username and password.  Masks are optional in the cancer centers. If you would like for your care team to wear a mask while they are taking care of you, please let them know. For doctor visits, patients may have with them one support person who is at least 49 years old. At this time, visitors are not allowed in the infusion area. Docetaxel injection What is this medication? DOCETAXEL (doe se TAX el) is a chemotherapy drug. It targets fast dividing cells, like cancer cells, and causes these cells to die. This medicine is used to treat many types of cancers like breast cancer, certain stomach cancers, head and neck cancer,  lung cancer, and prostate cancer. This medicine may be used for other purposes; ask your health care provider or pharmacist if you have questions. COMMON BRAND NAME(S): Docefrez, Taxotere What should I tell my care team before I take this medication? They need to know if you have any of these conditions: infection (especially a virus  infection such as chickenpox, cold sores, or herpes) liver disease low blood counts, like low white cell, platelet, or red cell counts an unusual or allergic reaction to docetaxel, polysorbate 80, other chemotherapy agents, other medicines, foods, dyes, or preservatives pregnant or trying to get pregnant breast-feeding How should I use this medication? This drug is given as an infusion into a vein. It is administered in a hospital or clinic by a specially trained health care professional. Talk to your pediatrician regarding the use of this medicine in children. Special care may be needed. Overdosage: If you think you have taken too much of this medicine contact a poison control center or emergency room at once. NOTE: This medicine is only for you. Do not share this medicine with others. What if I miss a dose? It is important not to miss your dose. Call your doctor or health care professional if you are unable to keep an appointment. What may interact with this medication? Do not take this medicine with any of the following medications: live virus vaccines This medicine may also interact with the following medications: aprepitant certain antibiotics like erythromycin or clarithromycin certain antivirals for HIV or hepatitis certain medicines for fungal infections like fluconazole, itraconazole, ketoconazole, posaconazole, or voriconazole cimetidine ciprofloxacin conivaptan cyclosporine dronedarone fluvoxamine grapefruit juice imatinib verapamil This list may not describe all possible interactions. Give your health care provider a list of all the medicines, herbs, non-prescription drugs, or dietary supplements you use. Also tell them if you smoke, drink alcohol, or use illegal drugs. Some items may interact with your medicine. What should I watch for while using this medication? Your condition will be monitored carefully while you are receiving this medicine. You will need important  blood work done while you are taking this medicine. Call your doctor or health care professional for advice if you get a fever, chills or sore throat, or other symptoms of a cold or flu. Do not treat yourself. This drug decreases your body's ability to fight infections. Try to avoid being around people who are sick. Some products may contain alcohol. Ask your health care professional if this medicine contains alcohol. Be sure to tell all health care professionals you are taking this medicine. Certain medicines, like metronidazole and disulfiram, can cause an unpleasant reaction when taken with alcohol. The reaction includes flushing, headache, nausea, vomiting, sweating, and increased thirst. The reaction can last from 30 minutes to several hours. You may get drowsy or dizzy. Do not drive, use machinery, or do anything that needs mental alertness until you know how this medicine affects you. Do not stand or sit up quickly, especially if you are an older patient. This reduces the risk of dizzy or fainting spells. Alcohol may interfere with the effect of this medicine. Talk to your health care professional about your risk of cancer. You may be more at risk for certain types of cancer if you take this medicine. Do not become pregnant while taking this medicine or for 6 months after stopping it. Women should inform their doctor if they wish to become pregnant or think they might be pregnant. There is a potential for serious side effects to  an unborn child. Talk to your health care professional or pharmacist for more information. Do not breast-feed an infant while taking this medicine or for 1 week after stopping it. Males who get this medicine must use a condom during sex with females who can get pregnant. If you get a woman pregnant, the baby could have birth defects. The baby could die before they are born. You will need to continue wearing a condom for 3 months after stopping the medicine. Tell your health care  provider right away if your partner becomes pregnant while you are taking this medicine. This may interfere with the ability to father a child. You should talk to your doctor or health care professional if you are concerned about your fertility. What side effects may I notice from receiving this medication? Side effects that you should report to your doctor or health care professional as soon as possible: allergic reactions like skin rash, itching or hives, swelling of the face, lips, or tongue blurred vision breathing problems changes in vision low blood counts - This drug may decrease the number of white blood cells, red blood cells and platelets. You may be at increased risk for infections and bleeding. nausea and vomiting pain, redness or irritation at site where injected pain, tingling, numbness in the hands or feet redness, blistering, peeling, or loosening of the skin, including inside the mouth signs of decreased platelets or bleeding - bruising, pinpoint red spots on the skin, black, tarry stools, nosebleeds signs of decreased red blood cells - unusually weak or tired, fainting spells, lightheadedness signs of infection - fever or chills, cough, sore throat, pain or difficulty passing urine swelling of the ankle, feet, hands Side effects that usually do not require medical attention (report to your doctor or health care professional if they continue or are bothersome): constipation diarrhea fingernail or toenail changes hair loss loss of appetite mouth sores muscle pain This list may not describe all possible side effects. Call your doctor for medical advice about side effects. You may report side effects to FDA at 1-800-FDA-1088. Where should I keep my medication? This drug is given in a hospital or clinic and will not be stored at home. NOTE: This sheet is a summary. It may not cover all possible information. If you have questions about this medicine, talk to your doctor,  pharmacist, or health care provider.  2023 Elsevier/Gold Standard (2021-05-01 00:00:00) Pertuzumab injection What is this medication? PERTUZUMAB (per TOOZ ue mab) is a monoclonal antibody. It is used to treat breast cancer. This medicine may be used for other purposes; ask your health care provider or pharmacist if you have questions. COMMON BRAND NAME(S): PERJETA What should I tell my care team before I take this medication? They need to know if you have any of these conditions: heart disease heart failure high blood pressure history of irregular heart beat recent or ongoing radiation therapy an unusual or allergic reaction to pertuzumab, other medicines, foods, dyes, or preservatives pregnant or trying to get pregnant breast-feeding How should I use this medication? This medicine is for infusion into a vein. It is given by a health care professional in a hospital or clinic setting. Talk to your pediatrician regarding the use of this medicine in children. Special care may be needed. Overdosage: If you think you have taken too much of this medicine contact a poison control center or emergency room at once. NOTE: This medicine is only for you. Do not share this medicine with others. What  if I miss a dose? It is important not to miss your dose. Call your doctor or health care professional if you are unable to keep an appointment. What may interact with this medication? Interactions are not expected. Give your health care provider a list of all the medicines, herbs, non-prescription drugs, or dietary supplements you use. Also tell them if you smoke, drink alcohol, or use illegal drugs. Some items may interact with your medicine. This list may not describe all possible interactions. Give your health care provider a list of all the medicines, herbs, non-prescription drugs, or dietary supplements you use. Also tell them if you smoke, drink alcohol, or use illegal drugs. Some items may interact  with your medicine. What should I watch for while using this medication? Your condition will be monitored carefully while you are receiving this medicine. Report any side effects. Continue your course of treatment even though you feel ill unless your doctor tells you to stop. Do not become pregnant while taking this medicine or for 7 months after stopping it. Women should inform their doctor if they wish to become pregnant or think they might be pregnant. Women of child-bearing potential will need to have a negative pregnancy test before starting this medicine. There is a potential for serious side effects to an unborn child. Talk to your health care professional or pharmacist for more information. Do not breast-feed an infant while taking this medicine or for 7 months after stopping it. Women must use effective birth control with this medicine. Call your doctor or health care professional for advice if you get a fever, chills or sore throat, or other symptoms of a cold or flu. Do not treat yourself. Try to avoid being around people who are sick. You may experience fever, chills, and headache during the infusion. Report any side effects during the infusion to your health care professional. What side effects may I notice from receiving this medication? Side effects that you should report to your doctor or health care professional as soon as possible: breathing problems chest pain or palpitations dizziness feeling faint or lightheaded fever or chills skin rash, itching or hives sore throat swelling of the face, lips, or tongue swelling of the legs or ankles unusually weak or tired Side effects that usually do not require medical attention (report to your doctor or health care professional if they continue or are bothersome): diarrhea hair loss nausea, vomiting tiredness This list may not describe all possible side effects. Call your doctor for medical advice about side effects. You may report  side effects to FDA at 1-800-FDA-1088. Where should I keep my medication? This drug is given in a hospital or clinic and will not be stored at home. NOTE: This sheet is a summary. It may not cover all possible information. If you have questions about this medicine, talk to your doctor, pharmacist, or health care provider.  2023 Elsevier/Gold Standard (2015-07-03 00:00:00) Trastuzumab injection for infusion What is this medication? TRASTUZUMAB (tras TOO zoo mab) is a monoclonal antibody. It is used to treat breast cancer and stomach cancer. This medicine may be used for other purposes; ask your health care provider or pharmacist if you have questions. COMMON BRAND NAME(S): Herceptin, Janae Bridgeman, Ontruzant, Trazimera What should I tell my care team before I take this medication? They need to know if you have any of these conditions: heart disease heart failure lung or breathing disease, like asthma an unusual or allergic reaction to trastuzumab, benzyl alcohol, or other  medications, foods, dyes, or preservatives pregnant or trying to get pregnant breast-feeding How should I use this medication? This drug is given as an infusion into a vein. It is administered in a hospital or clinic by a specially trained health care professional. Talk to your pediatrician regarding the use of this medicine in children. This medicine is not approved for use in children. Overdosage: If you think you have taken too much of this medicine contact a poison control center or emergency room at once. NOTE: This medicine is only for you. Do not share this medicine with others. What if I miss a dose? It is important not to miss a dose. Call your doctor or health care professional if you are unable to keep an appointment. What may interact with this medication? This medicine may interact with the following medications: certain types of chemotherapy, such as daunorubicin, doxorubicin, epirubicin, and  idarubicin This list may not describe all possible interactions. Give your health care provider a list of all the medicines, herbs, non-prescription drugs, or dietary supplements you use. Also tell them if you smoke, drink alcohol, or use illegal drugs. Some items may interact with your medicine. What should I watch for while using this medication? Visit your doctor for checks on your progress. Report any side effects. Continue your course of treatment even though you feel ill unless your doctor tells you to stop. Call your doctor or health care professional for advice if you get a fever, chills or sore throat, or other symptoms of a cold or flu. Do not treat yourself. Try to avoid being around people who are sick. You may experience fever, chills and shaking during your first infusion. These effects are usually mild and can be treated with other medicines. Report any side effects during the infusion to your health care professional. Fever and chills usually do not happen with later infusions. Do not become pregnant while taking this medicine or for 7 months after stopping it. Women should inform their doctor if they wish to become pregnant or think they might be pregnant. Women of child-bearing potential will need to have a negative pregnancy test before starting this medicine. There is a potential for serious side effects to an unborn child. Talk to your health care professional or pharmacist for more information. Do not breast-feed an infant while taking this medicine or for 7 months after stopping it. Women must use effective birth control with this medicine. What side effects may I notice from receiving this medication? Side effects that you should report to your doctor or health care professional as soon as possible: allergic reactions like skin rash, itching or hives, swelling of the face, lips, or tongue chest pain or palpitations cough dizziness feeling faint or lightheaded,  falls fever general ill feeling or flu-like symptoms signs of worsening heart failure like breathing problems; swelling in your legs and feet unusually weak or tired Side effects that usually do not require medical attention (report to your doctor or health care professional if they continue or are bothersome): bone pain changes in taste diarrhea joint pain nausea/vomiting weight loss This list may not describe all possible side effects. Call your doctor for medical advice about side effects. You may report side effects to FDA at 1-800-FDA-1088. Where should I keep my medication? This drug is given in a hospital or clinic and will not be stored at home. NOTE: This sheet is a summary. It may not cover all possible information. If you have questions about this  medicine, talk to your doctor, pharmacist, or health care provider.  2023 Elsevier/Gold Standard (2016-06-15 00:00:00)

## 2022-01-23 ENCOUNTER — Other Ambulatory Visit: Payer: Self-pay | Admitting: Physician Assistant

## 2022-01-25 ENCOUNTER — Other Ambulatory Visit: Payer: Self-pay

## 2022-01-25 ENCOUNTER — Telehealth: Payer: Self-pay | Admitting: *Deleted

## 2022-01-25 ENCOUNTER — Emergency Department (HOSPITAL_COMMUNITY): Payer: Federal, State, Local not specified - PPO

## 2022-01-25 ENCOUNTER — Emergency Department (HOSPITAL_COMMUNITY)
Admission: EM | Admit: 2022-01-25 | Discharge: 2022-01-25 | Disposition: A | Discharge status: Home / Self Care | Payer: Federal, State, Local not specified - PPO | Attending: Emergency Medicine | Admitting: Emergency Medicine

## 2022-01-25 ENCOUNTER — Encounter: Payer: Self-pay | Admitting: Hematology and Oncology

## 2022-01-25 ENCOUNTER — Encounter (HOSPITAL_COMMUNITY): Payer: Self-pay | Admitting: Emergency Medicine

## 2022-01-25 ENCOUNTER — Inpatient Hospital Stay: Payer: Federal, State, Local not specified - PPO

## 2022-01-25 DIAGNOSIS — Z91040 Latex allergy status: Secondary | ICD-10-CM | POA: Insufficient documentation

## 2022-01-25 DIAGNOSIS — J90 Pleural effusion, not elsewhere classified: Secondary | ICD-10-CM | POA: Diagnosis not present

## 2022-01-25 DIAGNOSIS — R0602 Shortness of breath: Secondary | ICD-10-CM | POA: Diagnosis not present

## 2022-01-25 DIAGNOSIS — K449 Diaphragmatic hernia without obstruction or gangrene: Secondary | ICD-10-CM | POA: Diagnosis not present

## 2022-01-25 LAB — CBC WITH DIFFERENTIAL/PLATELET
Abs Immature Granulocytes: 0.69 10*3/uL — ABNORMAL HIGH (ref 0.00–0.07)
Basophils Absolute: 0 10*3/uL (ref 0.0–0.1)
Basophils Relative: 0 %
Eosinophils Absolute: 0 10*3/uL (ref 0.0–0.5)
Eosinophils Relative: 0 %
HCT: 38.8 % (ref 36.0–46.0)
Hemoglobin: 12.9 g/dL (ref 12.0–15.0)
Immature Granulocytes: 6 %
Lymphocytes Relative: 1 %
Lymphs Abs: 0.2 10*3/uL — ABNORMAL LOW (ref 0.7–4.0)
MCH: 30.6 pg (ref 26.0–34.0)
MCHC: 33.2 g/dL (ref 30.0–36.0)
MCV: 91.9 fL (ref 80.0–100.0)
Monocytes Absolute: 0.3 10*3/uL (ref 0.1–1.0)
Monocytes Relative: 2 %
Neutro Abs: 9.6 10*3/uL — ABNORMAL HIGH (ref 1.7–7.7)
Neutrophils Relative %: 91 %
Platelets: 263 10*3/uL (ref 150–400)
RBC: 4.22 MIL/uL (ref 3.87–5.11)
RDW: 16.9 % — ABNORMAL HIGH (ref 11.5–15.5)
WBC: 10.7 10*3/uL — ABNORMAL HIGH (ref 4.0–10.5)
nRBC: 0.6 % — ABNORMAL HIGH (ref 0.0–0.2)

## 2022-01-25 LAB — COMPREHENSIVE METABOLIC PANEL
ALT: 17 U/L (ref 0–44)
AST: 16 U/L (ref 15–41)
Albumin: 4.1 g/dL (ref 3.5–5.0)
Alkaline Phosphatase: 66 U/L (ref 38–126)
Anion gap: 9 (ref 5–15)
BUN: 17 mg/dL (ref 6–20)
CO2: 25 mmol/L (ref 22–32)
Calcium: 9.4 mg/dL (ref 8.9–10.3)
Chloride: 104 mmol/L (ref 98–111)
Creatinine, Ser: 0.49 mg/dL (ref 0.44–1.00)
GFR, Estimated: >60 mL/min (ref >60)
Glucose, Bld: 100 mg/dL — ABNORMAL HIGH (ref 70–99)
Potassium: 3.7 mmol/L (ref 3.5–5.1)
Sodium: 138 mmol/L (ref 135–145)
Total Bilirubin: 0.9 mg/dL (ref 0.3–1.2)
Total Protein: 6.7 g/dL (ref 6.5–8.1)

## 2022-01-25 LAB — TROPONIN I (HIGH SENSITIVITY): Troponin I (High Sensitivity): 2 ng/L (ref <18)

## 2022-01-25 MED ORDER — IOHEXOL 350 MG/ML SOLN
75.0000 mL | Freq: Once | INTRAVENOUS | Status: AC | PRN
  Administered 2022-01-25: 75 mL via INTRAVENOUS

## 2022-01-25 MED ORDER — HEPARIN SOD (PORK) LOCK FLUSH 100 UNIT/ML IV SOLN
INTRAVENOUS | Status: AC
  Filled 2022-01-25: qty 5

## 2022-01-25 MED ORDER — ALBUTEROL SULFATE HFA 108 (90 BASE) MCG/ACT IN AERS: 2.0000 | INHALATION_SPRAY | RESPIRATORY_TRACT | Status: DC | PRN

## 2022-01-25 NOTE — ED Provider Notes (Signed)
[facility] DEPT Provider Note   CSN: [id number] Arrival date & time: 01/25/22  1000     History  Chief Complaint  Patient presents with   Shortness of Breath    Sherry Barry is a 51 y.o. female.  Patient with history of breast cancer undergoing active chemotherapy, history of metastasis to the lung, last treatment was on 01/22/2022 --presents to the emergency department for worsening shortness of breath.  Patient states that typically she will feel weak and tired after her sessions.  Last evening she started having mild shortness of breath, out of the ordinary, after her treatment.  No cough or fever.  Symptoms persisted this morning so she wanted to be checked to ensure no significant problems.  She states that she had some bilateral ankle swelling at time of treatment, however no calf pain or thigh pain over the weekend.  No vomiting or diarrhea.  No treatments prior to arrival.  Tachycardic on arrival but states that her pulse rate is normally high.      Home Medications Prior to Admission medications   Medication Sig Start Date End Date Taking? Authorizing Provider  acetaminophen (TYLENOL) 500 MG tablet Take 1,000 mg by mouth every 6 (six) hours as needed for moderate pain.    [provider]  amLODipine (NORVASC) 10 MG tablet Take 1 tablet (10 mg total) by mouth every morning. 10/29/21   Causey, Charlestine Massed, NP  dexamethasone (DECADRON) 4 MG tablet Take 2 tablets (8 mg total) by mouth 2 (two) times daily.  Start the day before Taxotere, then daily after chemo for 2 days 01/01/22   Lincoln Brigham, PA-C  KLOR-CON M20 20 MEQ tablet TAKE 1 TABLET BY MOUTH EVERY DAY 01/25/22   Dede Query T, PA-C  lidocaine-prilocaine (EMLA) cream Apply 1 application. topically as needed. 10/09/21   [name], MD  LORazepam (ATIVAN) 0.5 MG tablet Take 1 tablet (0.5 mg total) by mouth every 6 (six) hours as needed for anxiety. 10/09/21   [name], MD  ondansetron (ZOFRAN) 8 MG tablet Take 1 tablet (8 mg total) by mouth every 8 (eight) hours as needed for nausea or vomiting. 11/19/21   [name], MD  pantoprazole (PROTONIX) 40 MG tablet Take 1 tablet (40 mg total) by mouth at bedtime. 01/01/22   Lincoln Brigham, PA-C  prochlorperazine (COMPAZINE) 10 MG tablet Take 1 tablet (10 mg total) by mouth every 6 (six) hours as needed for nausea or vomiting. 10/09/21   [name], MD      Allergies    Codeine, Hydrocodone, Latex, Lisinopril, and Tomato    Review of Systems   Review of Systems  Physical Exam Updated Vital Signs BP (!) 141/111   Pulse 100   Temp 98.2 F (36.8 C) (Oral)   Resp 17   Ht 5' 2.5" (1.588 m)   Wt 72.6 kg   SpO2 99%   BMI 28.80 kg/m   Physical Exam Vitals and nursing note reviewed.  Constitutional:      Appearance: She is well-developed. She is not diaphoretic.  HENT:     Head: Normocephalic and atraumatic.     Mouth/Throat:     Mouth: Mucous membranes are not dry.  Eyes:     Conjunctiva/sclera: Conjunctivae normal.  Neck:     Vascular: Normal carotid pulses. No JVD.     Trachea: Trachea normal. No tracheal deviation.  Cardiovascular:     Rate and Rhythm: Normal rate and regular rhythm.  Pulses: No decreased pulses.          Radial pulses are 2+ on the right side and 2+ on the left side.     Heart sounds: Normal heart sounds, S1 normal and S2 normal. No murmur heard. Pulmonary:     Effort: Pulmonary effort is normal. Tachypnea present. No respiratory distress.     Breath sounds: No wheezing, rhonchi or rales.  Chest:     Chest wall: No tenderness.  Abdominal:     General: Bowel sounds are normal.     Palpations: Abdomen is soft.     Tenderness: There is no abdominal tenderness. There is no guarding or rebound.  Musculoskeletal:        General: Normal range of motion.     Cervical back: Normal range of motion and neck supple. No muscular tenderness.     Right lower leg: No  tenderness. No edema.     Left lower leg: No tenderness. No edema.  Skin:    General: Skin is warm and dry.     Coloration: Skin is not pale.  Neurological:     Mental Status: She is alert.    ED Results / Procedures / Treatments   Labs (all labs ordered are listed, but only abnormal results are displayed) Labs Reviewed  CBC WITH DIFFERENTIAL/PLATELET - Abnormal; Notable for the following components:      Result Value   WBC 10.7 (*)    RDW 16.9 (*)    nRBC 0.6 (*)    Neutro Abs 9.6 (*)    Lymphs Abs 0.2 (*)    Abs Immature Granulocytes 0.69 (*)    All other components within normal limits  COMPREHENSIVE METABOLIC PANEL - Abnormal; Notable for the following components:   Glucose, Bld 100 (*)    All other components within normal limits  TROPONIN I (HIGH SENSITIVITY)    EKG EKG Interpretation  Date/Time:  Monday January 25 2022 10:07:06 EDT Ventricular Rate:  119 PR Interval:  166 QRS Duration: 79 QT Interval:  299 QTC Calculation: 421 R Axis:   79 Text Interpretation: Sinus tachycardia Since last tracing rate faster Otherwise no significant change Confirmed by Daleen Bo [phone]) on 01/25/2022 12:00:39 PM  Radiology DG Chest 2 View  Result Date: 01/25/2022 CLINICAL DATA:  Shortness of breath since yesterday history of breast and lung cancer. EXAM: CHEST - 2 VIEW COMPARISON:  Chest radiograph 10/30/2021, CT chest 12/04/2021 FINDINGS: The right chest wall port tip terminates in the region of the cavoatrial junction. The cardiomediastinal silhouette is normal. There is a small left pleural effusion likely similar to the prior CT allowing for differences in modality. Left lung is otherwise clear with no new or worsening focal airspace disease. Right lung is clear. There is no right effusion. There is no pneumothorax There is no acute osseous abnormality. IMPRESSION: Small left pleural effusion is likely not significantly changed compared to the prior CT allowing for differences  in modality. No new or worsening focal airspace disease. Electronically Signed   By: Valetta Mole M.D.   On: 01/25/2022 11:09    Procedures Procedures    Medications Ordered in ED Medications  albuterol (VENTOLIN HFA) 108 (90 Base) MCG/ACT inhaler 2 puff (has no administration in time range)  iohexol (OMNIPAQUE) 350 MG/ML injection 75 mL (75 mLs Intravenous Contrast Given 01/25/22 1356)    ED Course/ Medical Decision Making/ A&P    Patient seen and examined. History obtained directly from patient.  No distress at  time of initial exam.  Labs/EKG: Ordered CBC, CMP, troponin.  Imaging: Ordered chest x-ray.  Medications/Fluids: Ordered: None ordered.   Most recent vital signs reviewed and are as follows: BP (!) 141/111   Pulse 100   Temp 98.2 F (36.8 C) (Oral)   Resp 17   Ht 5' 2.5" (1.588 m)   Wt 72.6 kg   SpO2 99%   BMI 28.80 kg/m   Initial impression: Shortness of breath and patient undergoing active chemotherapy.  Tachycardic but states that she is typically so.  No fever, cough, or other infectious symptoms.  No obvious symptoms of DVT.  Moderate risk Wells' score.   1:37 PM Reassessment performed. Patient appears stable.  Heart rate low 100s.  Labs personally reviewed and interpreted including: CBC with white blood cell count minimally elevated at 10.7, hemoglobin 12.9 otherwise unremarkable; CMP normal electrolytes and kidney function, glucose 100; troponin normal at 2.  Imaging personally visualized and interpreted including: Chest x-ray, agree small left pleural effusion, no infiltrates.  Reviewed pertinent lab work and imaging with patient at bedside. Questions answered.   Most current vital signs reviewed and are as follows: BP (!) 147/107 (BP Location: Left Arm)   Pulse 100   Temp 98 F (36.7 C) (Oral)   Resp 18   Ht 5' 2.5" (1.588 m)   Wt 72.6 kg   SpO2 97%   BMI 28.80 kg/m   Plan: Patient stable, however given risk factors and no other obvious  cause for worsening shortness of breath, will obtain CT imaging of the chest to evaluate for PE.  3:04 PM Reassessment performed. Patient appears stable.  She states that she was up to the restroom and did fine.  Imaging personally visualized and interpreted including: CT angiography of the chest, does not demonstrate any blood clot or other acute findings, previously known pleural effusion appears stable.  Reviewed pertinent lab work and imaging with patient at bedside. Questions answered.   Most current vital signs reviewed and are as follows: BP (!) 147/108   Pulse 99   Temp 98 F (36.7 C) (Oral)   Resp 16   Ht 5' 2.5" (1.588 m)   Wt 72.6 kg   SpO2 96%   BMI 28.80 kg/m   Plan: Discharge to home.   Prescriptions written for: None  Other home care instructions discussed: Rest, good hydration  ED return instructions discussed: Worsening shortness of breath, chest pain, fever  Follow-up instructions discussed: Patient encouraged to follow-up with their PCP in 3 days.  Encouraged her to let her care team know how she is feeling.                           Medical Decision Making Amount and/or Complexity of Data Reviewed Labs: ordered. Radiology: ordered.  Risk Prescription drug management.   Patient here with shortness of breath in setting of metastatic breast cancer.  Complaint of shortness of breath with tachycardia.  Active chemotherapy.  No evidence of sepsis or pneumonia today on imaging, by labs or CT scan.  Concern for PE given her significant risk factor profile, negative CTA.  Patient has maintained normal saturations and vital signs in the emergency department.  She looks well.  She is comfortable discharged home.  No concern for ACS, dissection, other intra-abdominal etiology of symptoms.  The patient's vital signs, pertinent lab work and imaging were reviewed and interpreted as discussed in the ED course. Hospitalization was considered for  further testing,  treatments, or serial exams/observation. However as patient is well-appearing, has a stable exam, and reassuring studies today, I do not feel that they warrant admission at this time. This plan was discussed with the patient who verbalizes agreement and comfort with this plan and seems reliable and able to return to the Emergency Department with worsening or changing symptoms.          Final Clinical Impression(s) / ED Diagnoses Final diagnoses:  Shortness of breath    Rx / DC Orders ED Discharge Orders     None         Carlisle Cater, PA-C 01/25/22 1506    Daleen Bo, MD 01/25/22 1557

## 2022-01-25 NOTE — Telephone Encounter (Signed)
Pt VM left at appox 930 am retrieved at 1155 am stating " I am scheduled to come in for my neulasta shot later today but just feel off"   " Feel very uncomfortable and my breathing is off " - "unsure if I need to come in or what to do "  Upon retrieving VM - opened chart and noted pt has proceeded to the ER with arrival time of 10 am.  This RN called pt (noted while retrieving VM"s pt called).  She said she presently " feeling improved now that I am in the ER "  She is presently d4 of cycle 6 of docetaxel,trastuzumab, pertuzumab with oral home steroid support- with last dose pm yesterday.  She is scheduled for neulasta today.  Pt will come over here if she is released within office hours - otherwise we will reschedule injection for tomorrow.

## 2022-01-25 NOTE — ED Triage Notes (Signed)
Pt reports SHOB since yesterday. Pt reports hx of breast and lung CA. Denies CP.

## 2022-01-25 NOTE — Discharge Instructions (Addendum)
Please read and follow all provided instructions.  Your diagnoses today include:  1. Shortness of breath     Tests performed today include: An EKG of your heart A chest x-ray Cardiac enzymes - a blood test for heart muscle damage Blood counts and electrolytes CT chest to evaluate for blood clot: No signs of blood clots in the lungs, you have some fluid which appears to be stable, no pneumonia Vital signs. See below for your results today.   Medications prescribed:  None  Take any prescribed medications only as directed.  Follow-up instructions: Please follow-up with your doctor in the next 3 days for recheck.   Return instructions:  SEEK IMMEDIATE MEDICAL ATTENTION IF: You have severe chest pain, especially if the pain is crushing or pressure-like and spreads to the arms, back, neck, or jaw, or if you have sweating, nausea or vomiting, or trouble with breathing. THIS IS AN EMERGENCY. Do not wait to see if the pain will go away. Get medical help at once. Call 911. DO NOT drive yourself to the hospital.  Your chest pain gets worse and does not go away after a few minutes of rest.   You have significant dizziness, if you pass out, or have trouble walking.  You have chest pain not typical of your usual pain for which you originally saw your caregiver.  You have any other emergent concerns regarding your health.  Additional Information: Chest pain comes from many different causes. Your caregiver has diagnosed you as having chest pain that is not specific for one problem, but does not require admission.  You are at low risk for an acute heart condition or other serious illness.   Your vital signs today were: BP (!) 147/108   Pulse 99   Temp 98 F (36.7 C) (Oral)   Resp 16   Ht 5' 2.5" (1.588 m)   Wt 72.6 kg   SpO2 96%   BMI 28.80 kg/m  If your blood pressure (BP) was elevated above 135/85 this visit, please have this repeated by your doctor within one  month. --------------

## 2022-01-26 ENCOUNTER — Inpatient Hospital Stay: Payer: Federal, State, Local not specified - PPO

## 2022-01-26 VITALS — BP 111/90 | HR 115 | Temp 98.9°F | Resp 20

## 2022-01-26 DIAGNOSIS — Z170 Estrogen receptor positive status [ER+]: Secondary | ICD-10-CM | POA: Diagnosis not present

## 2022-01-26 DIAGNOSIS — Z5111 Encounter for antineoplastic chemotherapy: Secondary | ICD-10-CM | POA: Diagnosis not present

## 2022-01-26 DIAGNOSIS — Z5112 Encounter for antineoplastic immunotherapy: Secondary | ICD-10-CM | POA: Diagnosis not present

## 2022-01-26 DIAGNOSIS — Z923 Personal history of irradiation: Secondary | ICD-10-CM | POA: Diagnosis not present

## 2022-01-26 DIAGNOSIS — C50211 Malignant neoplasm of upper-inner quadrant of right female breast: Secondary | ICD-10-CM | POA: Diagnosis not present

## 2022-01-26 DIAGNOSIS — Z5189 Encounter for other specified aftercare: Secondary | ICD-10-CM | POA: Diagnosis not present

## 2022-01-26 DIAGNOSIS — C7951 Secondary malignant neoplasm of bone: Secondary | ICD-10-CM | POA: Diagnosis not present

## 2022-01-26 DIAGNOSIS — Z9221 Personal history of antineoplastic chemotherapy: Secondary | ICD-10-CM | POA: Diagnosis not present

## 2022-01-26 DIAGNOSIS — Z9013 Acquired absence of bilateral breasts and nipples: Secondary | ICD-10-CM | POA: Diagnosis not present

## 2022-01-26 MED ORDER — PEGFILGRASTIM-CBQV 6 MG/0.6ML ~~LOC~~ SOSY
6.0000 mg | PREFILLED_SYRINGE | Freq: Once | SUBCUTANEOUS | Status: AC
  Administered 2022-01-26: 6 mg via SUBCUTANEOUS
  Filled 2022-01-26: qty 0.6

## 2022-02-08 ENCOUNTER — Telehealth: Payer: Self-pay | Admitting: *Deleted

## 2022-02-08 NOTE — Telephone Encounter (Signed)
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  Sherry Barry verbalized understanding.

## 2022-02-11 ENCOUNTER — Encounter: Payer: Self-pay | Admitting: Adult Health

## 2022-02-11 ENCOUNTER — Other Ambulatory Visit: Payer: Self-pay

## 2022-02-11 ENCOUNTER — Other Ambulatory Visit: Payer: Self-pay | Admitting: Hematology and Oncology

## 2022-02-11 ENCOUNTER — Inpatient Hospital Stay: Payer: Federal, State, Local not specified - PPO

## 2022-02-11 ENCOUNTER — Inpatient Hospital Stay (HOSPITAL_BASED_OUTPATIENT_CLINIC_OR_DEPARTMENT_OTHER): Payer: Federal, State, Local not specified - PPO | Admitting: Adult Health

## 2022-02-11 VITALS — BP 141/97 | HR 129 | Temp 97.5°F | Resp 18 | Ht 62.5 in | Wt 158.9 lb

## 2022-02-11 VITALS — HR 100

## 2022-02-11 DIAGNOSIS — C50919 Malignant neoplasm of unspecified site of unspecified female breast: Secondary | ICD-10-CM

## 2022-02-11 DIAGNOSIS — R000 Tachycardia, unspecified: Secondary | ICD-10-CM | POA: Diagnosis not present

## 2022-02-11 DIAGNOSIS — Z170 Estrogen receptor positive status [ER+]: Secondary | ICD-10-CM | POA: Diagnosis not present

## 2022-02-11 DIAGNOSIS — Z9013 Acquired absence of bilateral breasts and nipples: Secondary | ICD-10-CM | POA: Diagnosis not present

## 2022-02-11 DIAGNOSIS — Z5112 Encounter for antineoplastic immunotherapy: Secondary | ICD-10-CM | POA: Diagnosis not present

## 2022-02-11 DIAGNOSIS — C50211 Malignant neoplasm of upper-inner quadrant of right female breast: Secondary | ICD-10-CM | POA: Diagnosis not present

## 2022-02-11 DIAGNOSIS — Z5111 Encounter for antineoplastic chemotherapy: Secondary | ICD-10-CM | POA: Diagnosis not present

## 2022-02-11 DIAGNOSIS — Z9221 Personal history of antineoplastic chemotherapy: Secondary | ICD-10-CM | POA: Diagnosis not present

## 2022-02-11 DIAGNOSIS — C7951 Secondary malignant neoplasm of bone: Secondary | ICD-10-CM | POA: Diagnosis not present

## 2022-02-11 DIAGNOSIS — Z5189 Encounter for other specified aftercare: Secondary | ICD-10-CM | POA: Diagnosis not present

## 2022-02-11 DIAGNOSIS — Z923 Personal history of irradiation: Secondary | ICD-10-CM | POA: Diagnosis not present

## 2022-02-11 LAB — COMPREHENSIVE METABOLIC PANEL
ALT: 10 U/L (ref 0–44)
AST: 11 U/L — ABNORMAL LOW (ref 15–41)
Albumin: 4 g/dL (ref 3.5–5.0)
Alkaline Phosphatase: 78 U/L (ref 38–126)
Anion gap: 7 (ref 5–15)
BUN: 16 mg/dL (ref 6–20)
CO2: 26 mmol/L (ref 22–32)
Calcium: 9.6 mg/dL (ref 8.9–10.3)
Chloride: 111 mmol/L (ref 98–111)
Creatinine, Ser: 0.71 mg/dL (ref 0.44–1.00)
GFR, Estimated: >60 mL/min (ref >60)
Glucose, Bld: 108 mg/dL — ABNORMAL HIGH (ref 70–99)
Potassium: 3.4 mmol/L — ABNORMAL LOW (ref 3.5–5.1)
Sodium: 144 mmol/L (ref 135–145)
Total Bilirubin: 0.4 mg/dL (ref 0.3–1.2)
Total Protein: 6.3 g/dL — ABNORMAL LOW (ref 6.5–8.1)

## 2022-02-11 LAB — CBC WITH DIFFERENTIAL/PLATELET
Abs Immature Granulocytes: 0.71 10*3/uL — ABNORMAL HIGH (ref 0.00–0.07)
Basophils Absolute: 0.1 10*3/uL (ref 0.0–0.1)
Basophils Relative: 0 %
Eosinophils Absolute: 0 10*3/uL (ref 0.0–0.5)
Eosinophils Relative: 0 %
HCT: 32.7 % — ABNORMAL LOW (ref 36.0–46.0)
Hemoglobin: 11.2 g/dL — ABNORMAL LOW (ref 12.0–15.0)
Immature Granulocytes: 4 %
Lymphocytes Relative: 4 %
Lymphs Abs: 0.9 10*3/uL (ref 0.7–4.0)
MCH: 31.3 pg (ref 26.0–34.0)
MCHC: 34.3 g/dL (ref 30.0–36.0)
MCV: 91.3 fL (ref 80.0–100.0)
Monocytes Absolute: 0.7 10*3/uL (ref 0.1–1.0)
Monocytes Relative: 4 %
Neutro Abs: 18.1 10*3/uL — ABNORMAL HIGH (ref 1.7–7.7)
Neutrophils Relative %: 88 %
Platelets: 227 10*3/uL (ref 150–400)
RBC: 3.58 MIL/uL — ABNORMAL LOW (ref 3.87–5.11)
RDW: 16.7 % — ABNORMAL HIGH (ref 11.5–15.5)
WBC: 20.5 10*3/uL — ABNORMAL HIGH (ref 4.0–10.5)
nRBC: 0 % (ref 0.0–0.2)

## 2022-02-11 MED ORDER — METOPROLOL TARTRATE 25 MG PO TABS: 12.5000 mg | ORAL_TABLET | Freq: Two times a day (BID) | ORAL | 0 refills | Status: DC

## 2022-02-11 MED ORDER — TRASTUZUMAB-DKST CHEMO 150 MG IV SOLR
6.0000 mg/kg | Freq: Once | INTRAVENOUS | Status: AC
  Administered 2022-02-11: 399 mg via INTRAVENOUS
  Filled 2022-02-11: qty 19

## 2022-02-11 MED ORDER — SODIUM CHLORIDE 0.9 % IV SOLN: Freq: Once | INTRAVENOUS | Status: AC

## 2022-02-11 MED ORDER — SODIUM CHLORIDE 0.9% FLUSH
10.0000 mL | INTRAVENOUS | Status: DC | PRN
  Administered 2022-02-11: 10 mL

## 2022-02-11 MED ORDER — DIPHENHYDRAMINE HCL 25 MG PO CAPS
50.0000 mg | ORAL_CAPSULE | Freq: Once | ORAL | Status: AC
  Administered 2022-02-11: 50 mg via ORAL
  Filled 2022-02-11: qty 2

## 2022-02-11 MED ORDER — ACETAMINOPHEN 325 MG PO TABS
650.0000 mg | ORAL_TABLET | Freq: Once | ORAL | Status: AC
  Administered 2022-02-11: 650 mg via ORAL
  Filled 2022-02-11: qty 2

## 2022-02-11 MED ORDER — HEPARIN SOD (PORK) LOCK FLUSH 100 UNIT/ML IV SOLN
500.0000 [IU] | Freq: Once | INTRAVENOUS | Status: AC | PRN
  Administered 2022-02-11: 500 [IU]

## 2022-02-11 MED ORDER — SODIUM CHLORIDE 0.9 % IV SOLN
420.0000 mg | Freq: Once | INTRAVENOUS | Status: AC
  Administered 2022-02-11: 420 mg via INTRAVENOUS
  Filled 2022-02-11: qty 14

## 2022-02-11 MED ORDER — SODIUM CHLORIDE 0.9 % IV SOLN: 10.0000 mg | Freq: Once | INTRAVENOUS | Status: DC

## 2022-02-11 MED ORDER — SODIUM CHLORIDE 0.9% FLUSH
10.0000 mL | Freq: Once | INTRAVENOUS | Status: AC
  Administered 2022-02-11: 10 mL

## 2022-02-11 MED ORDER — GOSERELIN ACETATE 3.6 MG ~~LOC~~ IMPL
3.6000 mg | DRUG_IMPLANT | Freq: Once | SUBCUTANEOUS | Status: AC
  Administered 2022-02-11: 3.6 mg via SUBCUTANEOUS
  Filled 2022-02-11: qty 3.6

## 2022-02-11 NOTE — Progress Notes (Signed)
[facility] Follow up:    Sherry Congress, NP [place] [address]   DIAGNOSIS:  Cancer Staging  Malignant neoplasm of upper-inner quadrant of right breast in female, estrogen receptor positive ([place]) Staging form: Breast, AJCC 7th Edition - Clinical: Stage IA (T1c, N0, cM0) - Unsigned Specimen type: Core Needle Biopsy Histopathologic type: 9931 Laterality: Right Staging comments: Staged at breast conference 1.15.14  - Pathologic stage from 07/14/2012: Stage IV ([place], NX, M1) - Signed by [name], MD on 08/28/2021 Specimen type: Core Needle Biopsy Histopathologic type: 9931 Laterality: Right   SUMMARY OF ONCOLOGIC HISTORY: Oncology History  Malignant neoplasm of upper-inner quadrant of right breast in female, estrogen receptor positive ([place])  07/13/2012 Clinical Stage   Stage IA: T1c N0   07/14/2012 Definitive Surgery   Bilateral mastectomy/right SLNB: RIGHT invasive ductal carcinoma, grade 3, ER+, PR +, Her2/neu positive (ratio 3.02), Ki67 48%. DCIS. 1/4 LN positive for malignancy. LEFT: benign   07/14/2012 Pathologic Stage   Stage IIB: T2 N1a M0   07/14/2012 Cancer Staging   Staging form: Breast, AJCC 7th Edition - Pathologic stage from 07/14/2012: Stage IV (TX, NX, M1) - Signed by [name], MD on 08/28/2021 Specimen type: Core Needle Biopsy Histopathologic type: 9931 Laterality: Right   08/17/2012 - 08/30/2013 Chemotherapy   Adjuvant carboplatin, docetaxel, and trastuzumab x 6 cycles (completed 11/30/2012) followed by maintenance trastuzumab to total one year   11/2012 Procedure   Comp Cancer Gene panel (Gene[facility]) negative for deleterious mutations    - 02/2013 Radiation Therapy   Adjuvant RT to right breast   02/2013 -  Anti-estrogen oral therapy   Tamoxifen 20 mg daily   09/24/2013 Surgery   Bilateral breast reconstruction with latissmus flap and expander placement   12/12/2013 Surgery   Implant placement     Relapse/Recurrence   Left sided malignant pleural effusion.  Tumor cells are positive for GATA3 and ER, negative for TTF-1 consistent with recurrent breast carcinoma Prognostic showed ER 90% positive strong staining, PR 80% positive strong staining, HER2 negative.    09/16/2021 Imaging   PET scan showed malignant left pleural effusion with extensive areas of nodularity and hypermetabolic activity involving the mediastinal border and pericardium as well as the most inferior aspects of the costodiaphragmatic recess.  Signs of nodal disease at the thoracic inlet on the left suspect extension of disease below the diaphragm along the left anterolateral aorta.  Nonspecific moderate to markedly increased metabolic activity about the base of the tongue bilaterally.  Consider direct visualization showing mildly asymmetric uptake favoring the right lingual tonsil.  Sclerotic foci without marked increased metabolic activity suspicious for metastatic disease perhaps treated or questions.  Heterogeneous marrow uptake seen elsewhere generalized and nonspecific.  Consider spinal MRI is warranted  MRI brain without any evidence of intracranial metastatic disease.   10/09/2021 -  Chemotherapy   Patient is on Treatment Plan : BREAST DOCEtaxel + Trastuzumab + Pertuzumab (THP) q21d x 8 cycles / Trastuzumab + Pertuzumab q21d x 4 cycles     12/04/2021 Imaging   There is no evidence new metastatic disease. There is interval decrease in the left pleural effusion possibly suggesting resolving pleural metastatic disease. Few scattered sclerotic metastatic lesions in the skeletal structures have not changed significantly.   No acute findings are seen in the chest abdomen and pelvis.       CURRENT THERAPY:Taxotere, Herceptin, Perjeta  INTERVAL HISTORY: Sherry Barry 51 y.o. female returns for follow-up of her  metastatic breast cancer.  She is here today for cycle 7 of Taxotere Herceptin and Perjeta.  She also  receives Zometa every 12 weeks and Zoladex every 4 weeks.    Her most recent echocardiogram was completed on January 11, 2022 showing a left ventricular ejection fraction of 65 to 70%.  Sherry Barry has noticed worsening watering in her eyes since beginning her treatment.  She has not tried anything to alleviate this.  She has had an elevated heart rate and blood pressure however while at home her blood pressure has been running about 130s over 80s.  She developed shortness of breath about a week ago and underwent CTA of the chest that was negative for pulmonary embolism and showed no change in any of the cancer.  Prior to the CTA of the chest she underwent CT chest abdomen pelvis on December 04, 2021.  Sherry Barry notes that the neuropathy in her first and second digit finger pads bilaterally is worsening.  She struggles with fatigue.  She denies any recent fever chills nausea or vomiting.  She is not experiencing diarrhea and does take a potassium supplement every day.  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  MEDICAL HISTORY: Past Medical History:  Diagnosis Date   Allergy    Breast cancer ([place])    Breast cancer ([place])    right   History of chemotherapy    doxetaxel/carboplatin/trastuzumab    History of migraine    last one about a week ago   Hx of radiation therapy 01/01/13- 02/15/13   r chest wall, r supraclav/axilla 5040 cGy/28 sessions, scar boost 1000 cGy/5 sessions   Hypertension    no meds,   urgent care on pomana   Migraine    Migraine     SURGICAL HISTORY: Past Surgical History:  Procedure Laterality Date   ABDOMINAL HYSTERECTOMY     no salpingo-oophorectomy 2009   BREAST RECONSTRUCTION WITH PLACEMENT OF TISSUE EXPANDER AND FLEX HD (ACELLULAR HYDRATED DERMIS) Left 09/24/2013   IR IMAGING GUIDED PORT INSERTION  10/07/2021   IR THORACENTESIS ASP PLEURAL SPACE W/IMG GUIDE  08/11/2021   IR THORACENTESIS ASP PLEURAL SPACE W/IMG GUIDE  10/07/2021   LATISSIMUS FLAP TO BREAST Right 09/24/2013   Procedure: RIGHT LATISSMUS MYOCUTAEIOUS MUSCLE FLAP AND PLACEMENT OF TISSUE [name];  Surgeon: Theodoro Kos, DO;  Location: [facility];  Service: Plastics;  Laterality: Right;   LIPOSUCTION WITH LIPOFILLING Bilateral 12/12/2013   Procedure: LIPOSUCTION WITH LIPOFILLING;  Surgeon: Theodoro Kos, DO;  Location: [facility];  Service: Plastics;  Laterality: Bilateral;   PORT-A-CATH REMOVAL Left 12/12/2013   Procedure: REMOVAL PORT-A-CATH;  Surgeon: Theodoro Kos, DO;  Location: [facility];  Service: Plastics;  Laterality: Left;   PORTACATH PLACEMENT  07/14/2012   Procedure: INSERTION PORT-A-CATH;  Surgeon: Joyice Faster. Cornett, MD;  Location: [facility];  Service: General;  Laterality:  Left;   RECONSTRUCTION BREAST W/ LATISSIMUS DORSI FLAP Right 09/24/2013   "& tissue expander placement"   REMOVAL OF BILATERAL TISSUE EXPANDERS WITH PLACEMENT OF BILATERAL BREAST IMPLANTS Bilateral 12/12/2013   Procedure: REMOVAL OF BILATERAL TISSUE EXPANDERS WITH PLACEMENT OF BILATERAL BREAST IMPLANTS/BILATERAL CAPSULECTOMIES WITH  LIPOFILLING FAT GRAFTING;  Surgeon: Theodoro Kos, DO;  Location: [facility];  Service: Plastics;  Laterality: Bilateral;   SIMPLE MASTECTOMY WITH AXILLARY  SENTINEL NODE BIOPSY  07/14/2012   Procedure: SIMPLE MASTECTOMY WITH AXILLARY SENTINEL NODE BIOPSY;  Surgeon: Joyice Faster. Cornett, MD;  Location: [facility];  Service: General;  Laterality: Right;  Bilateral simple mastectomy with port and right sebtibel lymph node mapping   SIMPLE MASTECTOMY WITH AXILLARY SENTINEL NODE BIOPSY  07/14/2012   Procedure: SIMPLE MASTECTOMY;  Surgeon: Joyice Faster. Cornett, MD;  Location: [facility];  Service: General;  Laterality: Left;   TISSUE EXPANDER PLACEMENT Left 09/24/2013   Procedure: PLACEMENT OF TISSUE EXPANDER AND FLEX HD TO LEFT BREAST;  Surgeon: Theodoro Kos, DO;  Location: [facility];  Service: Plastics;  Laterality: Left;    SOCIAL HISTORY: Social History   Socioeconomic History   Marital status: Married    Spouse name: Not on file   Number of children: 2   Years of education: Not on file   Highest education level: Not on file  Occupational History    Employer: [employer]  Tobacco Use   Smoking status: Never   Smokeless tobacco: Never  Substance and Sexual Activity   Alcohol use: Yes    Comment: occasional   Drug use: No   Sexual activity: Yes    Birth control/protection: Surgical  Other Topics Concern   Not on file  Social History Narrative   Not on file   Social Determinants of Health   Financial Resource Strain: Not on file  Food Insecurity: Not on file  Transportation Needs: Not on file  Physical Activity: Not on file  Stress: Not on file  Social Connections: Not on file  Intimate Partner Violence: Not on file    FAMILY HISTORY: Family History  Problem Relation Age of Onset   Hypertension Mother    Alcohol abuse Mother    Heart disease Maternal Grandmother    Stroke Maternal Grandfather    Colon cancer Maternal Aunt 40       alive at 74   Brain cancer Maternal Uncle 43       and lymphoma in early 10s; deceased   Brain cancer Maternal Uncle 60       deceased   Pancreatic cancer Maternal Uncle 63       alive at 36   Breast cancer  Cousin 56       mat 1st cousin once removed through mat GF ; deceased   Breast cancer Maternal Aunt        great aunt through mat GF; dx at ? age    Review of Systems  Constitutional:  Positive for fatigue. Negative for appetite change, chills, fever and unexpected weight change.  HENT:   Negative for hearing loss, lump/mass and trouble swallowing.   Eyes:  Negative for eye problems and icterus.  Respiratory:  Negative for chest tightness, cough and shortness of breath.   Cardiovascular:  Negative for chest pain, leg swelling and palpitations.  Gastrointestinal:  Negative for abdominal distention, abdominal pain, constipation, diarrhea, nausea and vomiting.  Endocrine: Negative for hot flashes.  Genitourinary:  Negative for difficulty urinating.   Musculoskeletal:  Negative for arthralgias.  Skin:  Negative for itching and rash.  Neurological:  Positive for numbness. Negative for dizziness, extremity weakness and headaches.  Hematological:  Negative for adenopathy. Does not bruise/bleed easily.  Psychiatric/Behavioral:  Negative for depression. The patient is not nervous/anxious.       PHYSICAL EXAMINATION  ECOG PERFORMANCE STATUS: 1 - Symptomatic but completely ambulatory  Vitals:   02/11/22 1115  BP: (!) 141/97  Pulse: (!) 129  Resp: 18  Temp: (!) 97.5 F (36.4 C)  SpO2: 100%    Physical Exam Constitutional:      General: She is not in acute distress.    Appearance: Normal appearance. She is not toxic-appearing.  HENT:     Head: Normocephalic and atraumatic.  Eyes:     General: No scleral icterus. Cardiovascular:     Rate and Rhythm: Regular rhythm. Tachycardia present.     Pulses: Normal pulses.     Heart sounds: Normal heart sounds.  Pulmonary:     Effort: Pulmonary effort is normal.     Breath sounds: Normal breath sounds.  Abdominal:     General: Abdomen is flat. Bowel sounds are normal. There is no distension.     Palpations: Abdomen is soft.      Tenderness: There is no abdominal tenderness.  Musculoskeletal:        General: No swelling.     Cervical back: Neck supple.  Lymphadenopathy:     Cervical: No cervical adenopathy.  Skin:    General: Skin is warm and dry.     Findings: No rash.  Neurological:     General: No focal deficit present.     Mental Status: She is alert.  Psychiatric:        Mood and Affect: Mood normal.        Behavior: Behavior normal.     LABORATORY DATA:  CBC    Component Value Date/Time   WBC 20.5 (H) 02/11/2022 1100   RBC 3.58 (L) 02/11/2022 1100   HGB 11.2 (L) 02/11/2022 1100   HGB 15.9 10/30/2019 1030   HGB 14.7 02/15/2017 0852   HCT 32.7 (L) 02/11/2022 1100   HCT 46.1 10/30/2019 1030   HCT 42.9 02/15/2017 0852   PLT 227 02/11/2022 1100   PLT 245 10/30/2019 1030   MCV 91.3 02/11/2022 1100   MCV 88 10/30/2019 1030   MCV 86.7 02/15/2017 0852   MCH 31.3 02/11/2022 1100   MCHC 34.3 02/11/2022 1100   RDW 16.7 (H) 02/11/2022 1100   RDW 12.5 10/30/2019 1030   RDW 12.6 02/15/2017 0852   LYMPHSABS 0.9 02/11/2022 1100   LYMPHSABS 1.4 02/15/2017 0852   MONOABS 0.7 02/11/2022 1100   MONOABS 0.3 02/15/2017 0852   EOSABS 0.0 02/11/2022 1100   EOSABS 0.1 02/15/2017 0852   BASOSABS 0.1 02/11/2022 1100   BASOSABS 0.0 02/15/2017 0852    CMP     Component Value Date/Time   NA 144 02/11/2022 1100   NA 140 10/30/2019 1030   NA 141 02/15/2017 0852   K 3.4 (L) 02/11/2022 1100   K 3.9 02/15/2017 0852   CL 111 02/11/2022 1100   CL 101 11/15/2012 0904   CO2 26 02/11/2022 1100   CO2 25 02/15/2017 0852   GLUCOSE 108 (H) 02/11/2022 1100   GLUCOSE 87 02/15/2017 0852   GLUCOSE 69 (L) 11/15/2012 0904   BUN 16 02/11/2022 1100   BUN 11 10/30/2019 1030   BUN 12.5 02/15/2017 0852   CREATININE 0.71 02/11/2022 1100  CREATININE 0.9 02/15/2017 0852   CALCIUM 9.6 02/11/2022 1100   CALCIUM 9.3 02/15/2017 0852   PROT 6.3 (L) 02/11/2022 1100   PROT 6.7 10/30/2019 1030   PROT 7.0 02/15/2017 0852    ALBUMIN 4.0 02/11/2022 1100   ALBUMIN 4.2 10/30/2019 1030   ALBUMIN 3.6 02/15/2017 0852   AST 11 (L) 02/11/2022 1100   AST 16 02/15/2017 0852   ALT 10 02/11/2022 1100   ALT 13 02/15/2017 0852   ALKPHOS 78 02/11/2022 1100   ALKPHOS 63 02/15/2017 0852   BILITOT 0.4 02/11/2022 1100   BILITOT 0.5 10/30/2019 1030   BILITOT 0.69 02/15/2017 0852   GFRNONAA >60 02/11/2022 1100   GFRAA 100 10/30/2019 1030       ASSESSMENT and THERAPY PLAN:   Malignant neoplasm of upper-inner quadrant of right breast in female, estrogen receptor positive ([place]) Manahil is here for follow-up and treatment of her metastatic breast cancer.  She continues on treatment with Taxotere Herceptin and Perjeta.  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  She will continue on Herceptin and Perjeta alone and will continue with the Zoladex injection every 4 weeks.  For the lacrimal duct stenosis I suggested that she use Systane drops 1 to 2 drops in each eye 4 times a day as needed.  She is up-to-date with her echocardiograms however she is tachycardic at approximately 120 bpm that is regular on exam.  Her heart rate has been worsening over the past several weeks, therefore I ordered metoprolol twice daily for her to take.  We discussed risks and benefits of this medication as well as side effects.  She has a monitor at home that she can check her blood pressure with.  Her potassium is slightly low at 3.4.  She is taking supplementation for her potassium I also recommended potassium rich foods such as bananas melons potatoes and poultry.  She wanted to know about restarting antiestrogen therapy, however I recommended that she recover from the chemotherapy and discuss restarting when she sees Dr. Chryl Heck at her lab, follow-up, Herceptin Perjeta that is scheduled for 3 weeks from now.  The above was reviewed with Dr. Chryl Heck in detail with who is in  agreement and help to formulate the plan.  All questions were answered. The patient knows to call the clinic with any problems, questions or concerns. We can certainly see the patient much sooner if necessary.  Total encounter time:30 minutes*in face-to-face visit time, chart review, lab review, care coordination, order entry, and documentation of the encounter time.    Wilber Bihari, NP 02/11/22 10:39 PM Medical Oncology and Hematology [facility] [place], [place] [address] Tel. [phone]    Fax. [phone]  *Total Encounter Time as defined by the Centers for Medicare and Medicaid Services includes, in addition to the face-to-face time of a patient visit (documented in the note above) non-face-to-face time: obtaining and reviewing outside history, ordering and reviewing medications, tests or procedures, care coordination (communications with other health care professionals or caregivers) and documentation in the medical record.

## 2022-02-11 NOTE — Patient Instructions (Signed)
[facility] ONCOLOGY  Discharge Instructions: Thank you for choosing [facility] to provide your oncology and hematology care.   If you have a lab appointment with the [facility], please go directly to the [facility] and check in at the registration area.   Wear comfortable clothing and clothing appropriate for easy access to any Portacath or PICC line.   We strive to give you quality time with your provider. You may need to reschedule your appointment if you arrive late (15 or more minutes).  Arriving late affects you and other patients whose appointments are after yours.  Also, if you miss three or more appointments without notifying the office, you may be dismissed from the clinic at the provider's discretion.      For prescription refill requests, have your pharmacy contact our office and allow 72 hours for refills to be completed.    Today you received the following chemotherapy and/or immunotherapy agents: ogivri, perjeta      To help prevent nausea and vomiting after your treatment, we encourage you to take your nausea medication as directed.  BELOW ARE SYMPTOMS THAT SHOULD BE REPORTED IMMEDIATELY: *FEVER GREATER THAN 100.4 F (38 C) OR HIGHER *CHILLS OR SWEATING *NAUSEA AND VOMITING THAT IS NOT CONTROLLED WITH YOUR NAUSEA MEDICATION *UNUSUAL SHORTNESS OF BREATH *UNUSUAL BRUISING OR BLEEDING *URINARY PROBLEMS (pain or burning when urinating, or frequent urination) *BOWEL PROBLEMS (unusual diarrhea, constipation, pain near the anus) TENDERNESS IN MOUTH AND THROAT WITH OR WITHOUT PRESENCE OF ULCERS (sore throat, sores in mouth, or a toothache) UNUSUAL RASH, SWELLING OR PAIN  UNUSUAL VAGINAL DISCHARGE OR ITCHING   Items with * indicate a potential emergency and should be followed up as soon as possible or go to the Emergency Department if any problems should occur.  Please show the CHEMOTHERAPY ALERT CARD or IMMUNOTHERAPY ALERT CARD at  check-in to the Emergency Department and triage nurse.  Should you have questions after your visit or need to cancel or reschedule your appointment, please contact [facility]  Dept: [phone]  and follow the prompts.  Office hours are 8:00 a.m. to 4:30 p.m. Monday - Friday. Please note that voicemails left after 4:00 p.m. may not be returned until the following business day.  We are closed weekends and major holidays. You have access to a nurse at all times for urgent questions. Please call the main number to the clinic Dept: [phone] and follow the prompts.   For any non-urgent questions, you may also contact your provider using MyChart. We now offer e-Visits for anyone 22 and older to request care online for non-urgent symptoms. For details visit [email or web address].   Also download the MyChart app! Go to the app store, search "MyChart", open the app, select [facility], and log in with your MyChart username and password.  Masks are optional in the cancer centers. If you would like for your care team to wear a mask while they are taking care of you, please let them know. You may have one support person who is at least 49 years old accompany you for your appointments.

## 2022-02-11 NOTE — Progress Notes (Signed)
Patient has ongoing neuropathy. Will drop taxotere, continue herceptin and perjeta alone.  Sherry Barry

## 2022-02-11 NOTE — Assessment & Plan Note (Addendum)
Clint is here for follow-up and treatment of her metastatic breast cancer.  She continues on treatment with Taxotere Herceptin and Perjeta.  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  She will continue on Herceptin and Perjeta alone and will continue with the Zoladex injection every 4 weeks.  For the lacrimal duct stenosis I suggested that she use Systane drops 1 to 2 drops in each eye 4 times a day as needed.  She is up-to-date with her echocardiograms however she is tachycardic at approximately 120 bpm that is regular on exam.  Her heart rate has been worsening over the past several weeks, therefore I ordered metoprolol twice daily for her to take.  We discussed risks and benefits of this medication as well as side effects.  She has a monitor at home that she can check her blood pressure with.  Her potassium is slightly low at 3.4.  She is taking supplementation for her potassium I also recommended potassium rich foods such as bananas melons potatoes and poultry.  She wanted to know about restarting antiestrogen therapy, however I recommended that she recover from the chemotherapy and discuss restarting when she sees Dr. Chryl Heck at her lab, follow-up, Herceptin Perjeta that is scheduled for 3 weeks from now.  The above was reviewed with Dr. Chryl Heck in detail with who is in agreement and help to formulate the plan.

## 2022-02-13 ENCOUNTER — Inpatient Hospital Stay: Payer: Federal, State, Local not specified - PPO

## 2022-02-18 ENCOUNTER — Other Ambulatory Visit: Payer: Self-pay | Admitting: Physician Assistant

## 2022-02-21 ENCOUNTER — Encounter: Payer: Self-pay | Admitting: Hematology and Oncology

## 2022-02-28 ENCOUNTER — Other Ambulatory Visit: Payer: Self-pay | Admitting: Hematology and Oncology

## 2022-02-28 DIAGNOSIS — C50211 Malignant neoplasm of upper-inner quadrant of right female breast: Secondary | ICD-10-CM

## 2022-03-05 ENCOUNTER — Inpatient Hospital Stay: Payer: Federal, State, Local not specified - PPO | Attending: Hematology and Oncology

## 2022-03-05 ENCOUNTER — Inpatient Hospital Stay (HOSPITAL_BASED_OUTPATIENT_CLINIC_OR_DEPARTMENT_OTHER): Payer: Federal, State, Local not specified - PPO | Admitting: Hematology and Oncology

## 2022-03-05 ENCOUNTER — Inpatient Hospital Stay: Payer: Federal, State, Local not specified - PPO

## 2022-03-05 ENCOUNTER — Encounter: Payer: Self-pay | Admitting: Hematology and Oncology

## 2022-03-05 VITALS — BP 143/95 | HR 96 | Temp 97.9°F | Resp 16 | Ht 62.0 in | Wt 158.2 lb

## 2022-03-05 VITALS — BP 132/93 | HR 80 | Temp 98.3°F | Resp 18

## 2022-03-05 DIAGNOSIS — Z5112 Encounter for antineoplastic immunotherapy: Secondary | ICD-10-CM | POA: Diagnosis not present

## 2022-03-05 DIAGNOSIS — Z5111 Encounter for antineoplastic chemotherapy: Secondary | ICD-10-CM | POA: Insufficient documentation

## 2022-03-05 DIAGNOSIS — Z807 Family history of other malignant neoplasms of lymphoid, hematopoietic and related tissues: Secondary | ICD-10-CM | POA: Insufficient documentation

## 2022-03-05 DIAGNOSIS — R000 Tachycardia, unspecified: Secondary | ICD-10-CM | POA: Insufficient documentation

## 2022-03-05 DIAGNOSIS — Z9013 Acquired absence of bilateral breasts and nipples: Secondary | ICD-10-CM | POA: Insufficient documentation

## 2022-03-05 DIAGNOSIS — C50211 Malignant neoplasm of upper-inner quadrant of right female breast: Secondary | ICD-10-CM | POA: Insufficient documentation

## 2022-03-05 DIAGNOSIS — Z170 Estrogen receptor positive status [ER+]: Secondary | ICD-10-CM | POA: Diagnosis not present

## 2022-03-05 DIAGNOSIS — C7951 Secondary malignant neoplasm of bone: Secondary | ICD-10-CM | POA: Diagnosis not present

## 2022-03-05 DIAGNOSIS — K219 Gastro-esophageal reflux disease without esophagitis: Secondary | ICD-10-CM | POA: Insufficient documentation

## 2022-03-05 DIAGNOSIS — Z800 Family history of malignant neoplasm of digestive organs: Secondary | ICD-10-CM | POA: Diagnosis not present

## 2022-03-05 DIAGNOSIS — R110 Nausea: Secondary | ICD-10-CM | POA: Diagnosis not present

## 2022-03-05 DIAGNOSIS — Z9221 Personal history of antineoplastic chemotherapy: Secondary | ICD-10-CM | POA: Insufficient documentation

## 2022-03-05 DIAGNOSIS — Z803 Family history of malignant neoplasm of breast: Secondary | ICD-10-CM | POA: Insufficient documentation

## 2022-03-05 DIAGNOSIS — Z79899 Other long term (current) drug therapy: Secondary | ICD-10-CM | POA: Diagnosis not present

## 2022-03-05 DIAGNOSIS — Z923 Personal history of irradiation: Secondary | ICD-10-CM | POA: Diagnosis not present

## 2022-03-05 LAB — CMP (CANCER CENTER ONLY)
ALT: 9 U/L (ref 0–44)
AST: 15 U/L (ref 15–41)
Albumin: 4 g/dL (ref 3.5–5.0)
Alkaline Phosphatase: 102 U/L (ref 38–126)
Anion gap: 5 (ref 5–15)
BUN: 12 mg/dL (ref 6–20)
CO2: 28 mmol/L (ref 22–32)
Calcium: 9 mg/dL (ref 8.9–10.3)
Chloride: 109 mmol/L (ref 98–111)
Creatinine: 0.6 mg/dL (ref 0.44–1.00)
GFR, Estimated: >60 mL/min (ref >60)
Glucose, Bld: 91 mg/dL (ref 70–99)
Potassium: 3.3 mmol/L — ABNORMAL LOW (ref 3.5–5.1)
Sodium: 142 mmol/L (ref 135–145)
Total Bilirubin: 0.4 mg/dL (ref 0.3–1.2)
Total Protein: 6.3 g/dL — ABNORMAL LOW (ref 6.5–8.1)

## 2022-03-05 LAB — CBC WITH DIFFERENTIAL (CANCER CENTER ONLY)
Abs Immature Granulocytes: 0.02 10*3/uL (ref 0.00–0.07)
Basophils Absolute: 0 10*3/uL (ref 0.0–0.1)
Basophils Relative: 1 %
Eosinophils Absolute: 0.4 10*3/uL (ref 0.0–0.5)
Eosinophils Relative: 6 %
HCT: 39.5 % (ref 36.0–46.0)
Hemoglobin: 13.2 g/dL (ref 12.0–15.0)
Immature Granulocytes: 0 %
Lymphocytes Relative: 24 %
Lymphs Abs: 1.5 10*3/uL (ref 0.7–4.0)
MCH: 29.9 pg (ref 26.0–34.0)
MCHC: 33.4 g/dL (ref 30.0–36.0)
MCV: 89.4 fL (ref 80.0–100.0)
Monocytes Absolute: 0.5 10*3/uL (ref 0.1–1.0)
Monocytes Relative: 7 %
Neutro Abs: 4 10*3/uL (ref 1.7–7.7)
Neutrophils Relative %: 62 %
Platelet Count: 316 10*3/uL (ref 150–400)
RBC: 4.42 MIL/uL (ref 3.87–5.11)
RDW: 14.2 % (ref 11.5–15.5)
WBC Count: 6.3 10*3/uL (ref 4.0–10.5)
nRBC: 0 % (ref 0.0–0.2)

## 2022-03-05 MED ORDER — HEPARIN SOD (PORK) LOCK FLUSH 100 UNIT/ML IV SOLN
500.0000 [IU] | Freq: Once | INTRAVENOUS | Status: AC | PRN
  Administered 2022-03-05: 500 [IU]

## 2022-03-05 MED ORDER — DIPHENHYDRAMINE HCL 25 MG PO CAPS
25.0000 mg | ORAL_CAPSULE | Freq: Once | ORAL | Status: AC
  Administered 2022-03-05: 25 mg via ORAL
  Filled 2022-03-05: qty 1

## 2022-03-05 MED ORDER — TRASTUZUMAB-DKST CHEMO 150 MG IV SOLR
6.0000 mg/kg | Freq: Once | INTRAVENOUS | Status: AC
  Administered 2022-03-05: 441 mg via INTRAVENOUS
  Filled 2022-03-05: qty 21

## 2022-03-05 MED ORDER — ZOLEDRONIC ACID 4 MG/100ML IV SOLN
4.0000 mg | Freq: Once | INTRAVENOUS | Status: AC
  Administered 2022-03-05: 4 mg via INTRAVENOUS
  Filled 2022-03-05: qty 100

## 2022-03-05 MED ORDER — SODIUM CHLORIDE 0.9% FLUSH
10.0000 mL | INTRAVENOUS | Status: DC | PRN
  Administered 2022-03-05: 10 mL

## 2022-03-05 MED ORDER — SODIUM CHLORIDE 0.9 % IV SOLN: Freq: Once | INTRAVENOUS | Status: DC

## 2022-03-05 MED ORDER — SODIUM CHLORIDE 0.9 % IV SOLN: Freq: Once | INTRAVENOUS | Status: AC

## 2022-03-05 MED ORDER — TAMOXIFEN CITRATE 20 MG PO TABS: 20.0000 mg | ORAL_TABLET | Freq: Every day | ORAL | 3 refills | Status: DC

## 2022-03-05 MED ORDER — ACETAMINOPHEN 325 MG PO TABS
650.0000 mg | ORAL_TABLET | Freq: Once | ORAL | Status: AC
  Administered 2022-03-05: 650 mg via ORAL
  Filled 2022-03-05: qty 2

## 2022-03-05 MED ORDER — SODIUM CHLORIDE 0.9 % IV SOLN
420.0000 mg | Freq: Once | INTRAVENOUS | Status: AC
  Administered 2022-03-05: 420 mg via INTRAVENOUS
  Filled 2022-03-05: qty 14

## 2022-03-05 MED ORDER — SODIUM CHLORIDE 0.9% FLUSH
10.0000 mL | Freq: Once | INTRAVENOUS | Status: AC
  Administered 2022-03-05: 10 mL

## 2022-03-05 NOTE — Progress Notes (Signed)
[facility] Follow up:    Sherry Congress, NP [place] [address]   DIAGNOSIS:  Cancer Staging  Malignant neoplasm of upper-inner quadrant of right breast in female, estrogen receptor positive (Sherry Barry) Staging form: Breast, AJCC 7th Edition - Clinical: Stage IA (T1c, N0, cM0) - Unsigned Specimen type: Core Needle Biopsy Histopathologic type: 9931 Laterality: Right Staging comments: Staged at breast conference 1.15.14  - Pathologic stage from 07/14/2012: Stage IV (Sherry Barry, NX, M1) - Signed by [name], MD on 08/28/2021 Specimen type: Core Needle Biopsy Histopathologic type: 9931 Laterality: Right   SUMMARY OF ONCOLOGIC HISTORY: Oncology History  Malignant neoplasm of upper-inner quadrant of right breast in female, estrogen receptor positive (Sherry Barry)  07/13/2012 Clinical Stage   Stage IA: T1c N0   07/14/2012 Definitive Surgery   Bilateral mastectomy/right SLNB: RIGHT invasive ductal carcinoma, grade 3, ER+, PR +, Her2/neu positive (ratio 3.02), Ki67 48%. DCIS. 1/4 LN positive for malignancy. LEFT: benign   07/14/2012 Pathologic Stage   Stage IIB: T2 N1a M0   07/14/2012 Cancer Staging   Staging form: Breast, AJCC 7th Edition - Pathologic stage from 07/14/2012: Stage IV (TX, NX, M1) - Signed by [name], MD on 08/28/2021 Specimen type: Core Needle Biopsy Histopathologic type: 9931 Laterality: Right   08/17/2012 - 08/30/2013 Chemotherapy   Adjuvant carboplatin, docetaxel, and trastuzumab x 6 cycles (completed 11/30/2012) followed by maintenance trastuzumab to total one year   11/2012 Procedure   Comp Cancer Gene panel (GeneDx) negative for deleterious mutations    - 02/2013 Radiation Therapy   Adjuvant RT to right breast   02/2013 -  Anti-estrogen oral therapy   Tamoxifen 20 mg daily   09/24/2013 Surgery   Bilateral breast reconstruction with latissmus flap and expander placement   12/12/2013 Surgery   Implant placement     Relapse/Recurrence   Left sided malignant pleural effusion.  Tumor cells are positive for GATA3 and ER, negative for TTF-1 consistent with recurrent breast carcinoma Prognostic showed ER 90% positive strong staining, PR 80% positive strong staining, HER2 negative.    09/16/2021 Imaging   PET scan showed malignant left pleural effusion with extensive areas of nodularity and hypermetabolic activity involving the mediastinal border and pericardium as well as the most inferior aspects of the costodiaphragmatic recess.  Signs of nodal disease at the thoracic inlet on the left suspect extension of disease below the diaphragm along the left anterolateral aorta.  Nonspecific moderate to markedly increased metabolic activity about the base of the tongue bilaterally.  Consider direct visualization showing mildly asymmetric uptake favoring the right lingual tonsil.  Sclerotic foci without marked increased metabolic activity suspicious for metastatic disease perhaps treated or questions.  Heterogeneous marrow uptake seen elsewhere generalized and nonspecific.  Consider spinal MRI is warranted  MRI brain without any evidence of intracranial metastatic disease.   10/09/2021 - 02/11/2022 Chemotherapy   Patient is on Treatment Plan : BREAST DOCEtaxel + Trastuzumab + Pertuzumab (THP) q21d x 8 cycles / Trastuzumab + Pertuzumab q21d x 4 cycles     10/09/2021 -  Chemotherapy   Patient is on Treatment Plan : BREAST DOCEtaxel + Trastuzumab + Pertuzumab (THP) q21d x 8 cycles / Trastuzumab + Pertuzumab q21d x 4 cycles     12/04/2021 Imaging   There is no evidence new metastatic disease. There is interval decrease in the left pleural effusion possibly suggesting resolving pleural metastatic disease. Few scattered sclerotic metastatic lesions in the skeletal structures have not changed significantly.  No acute findings are seen in the chest abdomen and pelvis.       CURRENT THERAPY: THP  INTERVAL HISTORY:  Sherry  Barry 51 y.o. female returns for follow-up for cycle 6 of THP.  She is accompanied by her husband for this visit. Patient is here for a follow-up with her husband.  Since we dropped the Taxotere, she tells me that she feels 1 million times better.  She has however noticed some muscle aches without a clear explanation.  Son also has some cold so she wonders if this is related to another infection.  She denies any fevers or chills.  No neuropathy reported today.  She will continue Zoladex as well as tamoxifen now that she is done with chemotherapy. She will be starting her Zometa soon as well Rest of the pertinent 10 point ROS reviewed and negative.  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  MEDICAL HISTORY: Past Medical History:  Diagnosis Date   Allergy    Breast cancer ([place])    Breast cancer ([place])    right   History of chemotherapy    doxetaxel/carboplatin/trastuzumab   History of migraine    last one about a week ago   Hx of radiation therapy 01/01/13- 02/15/13   r chest wall, r supraclav/axilla 5040 cGy/28 sessions, scar boost 1000 cGy/5 sessions   Hypertension    no meds,   urgent care on pomana   Migraine    Migraine     SURGICAL HISTORY: Past Surgical History:   Procedure Laterality Date   ABDOMINAL HYSTERECTOMY     no salpingo-oophorectomy 2009   BREAST RECONSTRUCTION WITH PLACEMENT OF TISSUE EXPANDER AND FLEX HD (ACELLULAR HYDRATED DERMIS) Left 09/24/2013   IR IMAGING GUIDED PORT INSERTION  10/07/2021   IR THORACENTESIS ASP PLEURAL SPACE W/IMG GUIDE  08/11/2021   IR THORACENTESIS ASP PLEURAL SPACE W/IMG GUIDE  10/07/2021   LATISSIMUS FLAP TO BREAST Right 09/24/2013   Procedure: RIGHT LATISSMUS MYOCUTAEIOUS MUSCLE FLAP AND PLACEMENT OF TISSUE [name];  Surgeon: Theodoro Kos, DO;  Location: [facility];  Service: Plastics;  Laterality: Right;   LIPOSUCTION WITH LIPOFILLING Bilateral 12/12/2013   Procedure: LIPOSUCTION WITH LIPOFILLING;  Surgeon: Theodoro Kos, DO;  Location: [facility];  Service: Plastics;  Laterality: Bilateral;   PORT-A-CATH REMOVAL Left 12/12/2013   Procedure: REMOVAL PORT-A-CATH;  Surgeon: Theodoro Kos, DO;  Location: [facility];  Service: Plastics;  Laterality: Left;   PORTACATH PLACEMENT  07/14/2012   Procedure: INSERTION PORT-A-CATH;  Surgeon: Joyice Faster. Cornett, MD;  Location: [facility];  Service: General;  Laterality: Left;   RECONSTRUCTION BREAST W/ LATISSIMUS DORSI FLAP Right 09/24/2013   "& tissue expander placement"   REMOVAL OF BILATERAL TISSUE EXPANDERS WITH PLACEMENT OF BILATERAL BREAST IMPLANTS Bilateral 12/12/2013   Procedure: REMOVAL OF BILATERAL TISSUE EXPANDERS WITH PLACEMENT OF BILATERAL BREAST IMPLANTS/BILATERAL CAPSULECTOMIES  WITH  LIPOFILLING FAT GRAFTING;  Surgeon: Theodoro Kos, DO;  Location: [facility];  Service: Plastics;  Laterality: Bilateral;   SIMPLE MASTECTOMY WITH AXILLARY SENTINEL NODE BIOPSY  07/14/2012   Procedure: SIMPLE MASTECTOMY WITH AXILLARY SENTINEL NODE BIOPSY;  Surgeon: Joyice Faster. Cornett, MD;  Location: [facility];  Service: General;  Laterality: Right;  Bilateral simple mastectomy with port and right sebtibel lymph node mapping   SIMPLE MASTECTOMY WITH AXILLARY  SENTINEL NODE BIOPSY  07/14/2012   Procedure: SIMPLE MASTECTOMY;  Surgeon: Joyice Faster. Cornett, MD;  Location: [facility];  Service: General;  Laterality: Left;   TISSUE EXPANDER PLACEMENT Left 09/24/2013   Procedure: PLACEMENT OF TISSUE EXPANDER AND FLEX HD TO LEFT BREAST;  Surgeon: Theodoro Kos, DO;  Location: [facility];  Service: Plastics;  Laterality: Left;    SOCIAL HISTORY: Social History   Socioeconomic History   Marital status: Married    Spouse name: Not on file   Number of children: 2   Years of education: Not on file   Highest education level: Not on file  Occupational History    Employer: [employer]  Tobacco Use   Smoking status: Never   Smokeless tobacco: Never  Substance and Sexual Activity   Alcohol use: Yes    Comment: occasional   Drug use: No   Sexual activity: Yes    Birth control/protection: Surgical  Other Topics Concern   Not on file  Social History Narrative   Not on file   Social Determinants of Health   Financial Resource Strain: Not on file  Food Insecurity: Not on file  Transportation Needs: Not on file  Physical Activity: Not on file  Stress: Not on file  Social Connections: Not on file  Intimate Partner Violence: Not on file    FAMILY HISTORY: Family History  Problem Relation Age of Onset   Hypertension Mother    Alcohol abuse Mother    Heart disease Maternal Grandmother    Stroke Maternal Grandfather    Colon cancer Maternal Aunt 7       alive at 64   Brain cancer Maternal Uncle 8       and lymphoma in early 44s; deceased   Brain cancer Maternal Uncle 60       deceased   Pancreatic cancer Maternal Uncle 60       alive at 54   Breast cancer Cousin 28       mat 1st cousin once removed through mat GF ; deceased   Breast cancer Maternal Aunt        great aunt through mat GF; dx at ? age    Review of Systems  Constitutional:  Positive for fatigue. Negative for appetite change, chills, fever and unexpected weight change.  HENT:    Negative for hearing loss, lump/mass and trouble swallowing.   Eyes:  Negative for eye problems and icterus.  Respiratory:  Negative for chest tightness and cough.   Cardiovascular:  Negative for chest pain, leg swelling and palpitations.  Gastrointestinal:  Negative for abdominal distention, abdominal pain, constipation, diarrhea, nausea and vomiting.  Endocrine: Negative for hot flashes.  Genitourinary:  Negative for difficulty urinating.   Musculoskeletal:  Positive for arthralgias and myalgias.  Skin:  Negative for itching and rash.  Neurological:  Negative for dizziness, extremity weakness, headaches and numbness.  Hematological:  Negative for adenopathy. Does not bruise/bleed easily.  Psychiatric/Behavioral:  Negative for depression. The patient is not nervous/anxious.     PHYSICAL  EXAMINATION  ECOG PERFORMANCE STATUS: 1 - Symptomatic but completely ambulatory  Vitals:   03/05/22 1321  BP: (!) 143/95  Pulse: 96  Resp: 16  Temp: 97.9 F (36.6 C)  SpO2: 99%     Physical Exam Constitutional:      General: She is not in acute distress.    Appearance: Normal appearance. She is not toxic-appearing.  HENT:     Head: Normocephalic and atraumatic.  Eyes:     General: No scleral icterus. Cardiovascular:     Rate and Rhythm: Regular rhythm.     Pulses: Normal pulses.     Heart sounds: Normal heart sounds.  Pulmonary:     Effort: Pulmonary effort is normal.     Breath sounds: Normal breath sounds.  Abdominal:     General: Abdomen is flat. Bowel sounds are normal. There is no distension.     Palpations: Abdomen is soft.     Tenderness: There is no abdominal tenderness.  Musculoskeletal:        General: No swelling.     Cervical back: Neck supple.  Lymphadenopathy:     Cervical: No cervical adenopathy.  Skin:    General: Skin is warm and dry.     Findings: No rash.  Neurological:     General: No focal deficit present.     Mental Status: She is alert.  Psychiatric:         Mood and Affect: Mood normal.        Behavior: Behavior normal.     LABORATORY DATA:  CBC    Component Value Date/Time   WBC 6.3 03/05/2022 1252   WBC 20.5 (H) 02/11/2022 1100   RBC 4.42 03/05/2022 1252   HGB 13.2 03/05/2022 1252   HGB 15.9 10/30/2019 1030   HGB 14.7 02/15/2017 0852   HCT 39.5 03/05/2022 1252   HCT 46.1 10/30/2019 1030   HCT 42.9 02/15/2017 0852   PLT 316 03/05/2022 1252   PLT 245 10/30/2019 1030   MCV 89.4 03/05/2022 1252   MCV 88 10/30/2019 1030   MCV 86.7 02/15/2017 0852   MCH 29.9 03/05/2022 1252   MCHC 33.4 03/05/2022 1252   RDW 14.2 03/05/2022 1252   RDW 12.5 10/30/2019 1030   RDW 12.6 02/15/2017 0852   LYMPHSABS 1.5 03/05/2022 1252   LYMPHSABS 1.4 02/15/2017 0852   MONOABS 0.5 03/05/2022 1252   MONOABS 0.3 02/15/2017 0852   EOSABS 0.4 03/05/2022 1252   EOSABS 0.1 02/15/2017 0852   BASOSABS 0.0 03/05/2022 1252   BASOSABS 0.0 02/15/2017 0852    CMP     Component Value Date/Time   NA 142 03/05/2022 1252   NA 140 10/30/2019 1030   NA 141 02/15/2017 0852   K 3.3 (L) 03/05/2022 1252   K 3.9 02/15/2017 0852   CL 109 03/05/2022 1252   CL 101 11/15/2012 0904   CO2 28 03/05/2022 1252   CO2 25 02/15/2017 0852   GLUCOSE 91 03/05/2022 1252   GLUCOSE 87 02/15/2017 0852   GLUCOSE 69 (L) 11/15/2012 0904   BUN 12 03/05/2022 1252   BUN 11 10/30/2019 1030   BUN 12.5 02/15/2017 0852   CREATININE 0.60 03/05/2022 1252   CREATININE 0.9 02/15/2017 0852   CALCIUM 9.0 03/05/2022 1252   CALCIUM 9.3 02/15/2017 0852   PROT 6.3 (L) 03/05/2022 1252   PROT 6.7 10/30/2019 1030   PROT 7.0 02/15/2017 0852   ALBUMIN 4.0 03/05/2022 1252   ALBUMIN 4.2 10/30/2019 1030   ALBUMIN 3.6 02/15/2017  0852   AST 15 03/05/2022 1252   AST 16 02/15/2017 0852   ALT 9 03/05/2022 1252   ALT 13 02/15/2017 0852   ALKPHOS 102 03/05/2022 1252   ALKPHOS 63 02/15/2017 0852   BILITOT 0.4 03/05/2022 1252   BILITOT 0.69 02/15/2017 0852   GFRNONAA >60 03/05/2022 1252   GFRAA  100 10/30/2019 1030     ASSESSMENT and THERAPY PLAN:  Kaelah Hayashi is a 51 y.o. female who returns for a follow-up for stage IV triple positive breast cancer.  #Stage IV triple positive breast cancer: --Currently treatment includes Taxotere, Herceptin and Perjeta. --CT imaging on 12/05/2018 3:23 cycles showed no new evidence of metastatic disease, decrease in left pleural effusion suggesting resolving pleural metastatic disease few second scattered sclerotic metastatic lesions which have not changed significantly.  Overall these findings are consistent with response.   -- Patient is due for cycle 6 of Taxotere, Herceptin and Perjeta.   -- Today we have discussed about continuing chemotherapy for about 8 cycles if well-tolerated followed by anti-HER2 directed therapy --She also continues on ovarian suppression ( She was initially started on AI and ovarian suppression, AI was held during chemotherapy, she continues on ovarian suppression)  #Nausea: --Secondary to chemotherapy, well controlled with prescribed antiemetics.  --Continue to monitor.   #Tachycardia:  She is followed by Dr. Aundra Dubin from cardio oncology.  She will continue trastuzumab and she will repeat serial echocardiograms.  Most recent echo appears satisfactory from oncology standpoint.  #GERD: --Pantoprazole has been working well for her.  Follow up:  She understands that the treatment is indefinite as long as it works for her or as long as there is no unacceptable toxicity. She will now continue on Herceptin and pertuzumab maintenance.  Next echocardiogram due in October.  I have also ordered repeat imaging, CT chest abdomen and pelvis.  Clinically she has significantly improved.  I do not believe the myalgias are related to Herceptin and pertuzumab, it could be a possible upper respiratory tract infection or a viral infection that she might have contracted from her son.  We will continue to monitor the  symptoms. Since her pathology showed 100% staining for estrogen and progesterone receptor, have recommended that we add tamoxifen back to her regimen along with Zoladex now that she is done with chemotherapy.  She is in agreement with this.  Prescription dispensed to the pharmacy of her choice.  We have also received dental clearance for her hence okay to start Zometa if labs are within parameters.  I have requested that the Zometa and Zoladex schedule be combined together so they can be both given every 12 weeks.  She is agreeable to this plan. She will return to clinic as scheduled.  Total time spent: 30 minutes

## 2022-03-08 ENCOUNTER — Inpatient Hospital Stay: Payer: Federal, State, Local not specified - PPO

## 2022-03-08 ENCOUNTER — Telehealth: Payer: Self-pay | Admitting: Hematology and Oncology

## 2022-03-08 NOTE — Telephone Encounter (Signed)
Per 9/25 phone line pt called to cancel appointment

## 2022-03-10 ENCOUNTER — Other Ambulatory Visit: Payer: Self-pay

## 2022-03-11 ENCOUNTER — Other Ambulatory Visit: Payer: Self-pay

## 2022-03-11 ENCOUNTER — Inpatient Hospital Stay: Payer: Federal, State, Local not specified - PPO

## 2022-03-11 VITALS — BP 125/93 | HR 87 | Temp 99.1°F | Resp 18

## 2022-03-11 DIAGNOSIS — R000 Tachycardia, unspecified: Secondary | ICD-10-CM | POA: Diagnosis not present

## 2022-03-11 DIAGNOSIS — Z923 Personal history of irradiation: Secondary | ICD-10-CM | POA: Diagnosis not present

## 2022-03-11 DIAGNOSIS — R110 Nausea: Secondary | ICD-10-CM | POA: Diagnosis not present

## 2022-03-11 DIAGNOSIS — Z800 Family history of malignant neoplasm of digestive organs: Secondary | ICD-10-CM | POA: Diagnosis not present

## 2022-03-11 DIAGNOSIS — Z5111 Encounter for antineoplastic chemotherapy: Secondary | ICD-10-CM | POA: Diagnosis not present

## 2022-03-11 DIAGNOSIS — Z5112 Encounter for antineoplastic immunotherapy: Secondary | ICD-10-CM | POA: Diagnosis not present

## 2022-03-11 DIAGNOSIS — C50211 Malignant neoplasm of upper-inner quadrant of right female breast: Secondary | ICD-10-CM | POA: Diagnosis not present

## 2022-03-11 DIAGNOSIS — Z170 Estrogen receptor positive status [ER+]: Secondary | ICD-10-CM | POA: Diagnosis not present

## 2022-03-11 DIAGNOSIS — C7951 Secondary malignant neoplasm of bone: Secondary | ICD-10-CM | POA: Diagnosis not present

## 2022-03-11 DIAGNOSIS — Z9013 Acquired absence of bilateral breasts and nipples: Secondary | ICD-10-CM | POA: Diagnosis not present

## 2022-03-11 DIAGNOSIS — Z9221 Personal history of antineoplastic chemotherapy: Secondary | ICD-10-CM | POA: Diagnosis not present

## 2022-03-11 DIAGNOSIS — Z79899 Other long term (current) drug therapy: Secondary | ICD-10-CM | POA: Diagnosis not present

## 2022-03-11 DIAGNOSIS — K219 Gastro-esophageal reflux disease without esophagitis: Secondary | ICD-10-CM | POA: Diagnosis not present

## 2022-03-11 MED ORDER — GOSERELIN ACETATE 10.8 MG ~~LOC~~ IMPL
10.8000 mg | DRUG_IMPLANT | Freq: Once | SUBCUTANEOUS | Status: AC
  Administered 2022-03-11: 10.8 mg via SUBCUTANEOUS
  Filled 2022-03-11: qty 10.8

## 2022-03-12 ENCOUNTER — Other Ambulatory Visit: Payer: Self-pay

## 2022-03-15 ENCOUNTER — Ambulatory Visit (HOSPITAL_COMMUNITY)
Admission: RE | Admit: 2022-03-15 | Discharge: 2022-03-15 | Disposition: A | Discharge status: Home / Self Care | Payer: Federal, State, Local not specified - PPO | Source: Ambulatory Visit | Attending: Family Medicine | Admitting: Family Medicine

## 2022-03-15 ENCOUNTER — Encounter (HOSPITAL_COMMUNITY): Payer: Self-pay | Admitting: Cardiology

## 2022-03-15 ENCOUNTER — Ambulatory Visit (HOSPITAL_BASED_OUTPATIENT_CLINIC_OR_DEPARTMENT_OTHER)
Admission: RE | Admit: 2022-03-15 | Discharge: 2022-03-15 | Disposition: A | Discharge status: Home / Self Care | Payer: Federal, State, Local not specified - PPO | Source: Ambulatory Visit | Attending: Cardiology | Admitting: Cardiology

## 2022-03-15 VITALS — BP 120/80 | HR 69 | Wt 153.8 lb

## 2022-03-15 DIAGNOSIS — Z79899 Other long term (current) drug therapy: Secondary | ICD-10-CM | POA: Insufficient documentation

## 2022-03-15 DIAGNOSIS — I10 Essential (primary) hypertension: Secondary | ICD-10-CM | POA: Diagnosis not present

## 2022-03-15 DIAGNOSIS — Z170 Estrogen receptor positive status [ER+]: Secondary | ICD-10-CM

## 2022-03-15 DIAGNOSIS — J910 Malignant pleural effusion: Secondary | ICD-10-CM | POA: Insufficient documentation

## 2022-03-15 DIAGNOSIS — Z803 Family history of malignant neoplasm of breast: Secondary | ICD-10-CM | POA: Diagnosis not present

## 2022-03-15 DIAGNOSIS — Z8249 Family history of ischemic heart disease and other diseases of the circulatory system: Secondary | ICD-10-CM | POA: Insufficient documentation

## 2022-03-15 DIAGNOSIS — C50211 Malignant neoplasm of upper-inner quadrant of right female breast: Secondary | ICD-10-CM

## 2022-03-15 DIAGNOSIS — Z923 Personal history of irradiation: Secondary | ICD-10-CM | POA: Diagnosis not present

## 2022-03-15 DIAGNOSIS — Z9013 Acquired absence of bilateral breasts and nipples: Secondary | ICD-10-CM | POA: Insufficient documentation

## 2022-03-15 DIAGNOSIS — Z0189 Encounter for other specified special examinations: Secondary | ICD-10-CM

## 2022-03-15 DIAGNOSIS — I081 Rheumatic disorders of both mitral and tricuspid valves: Secondary | ICD-10-CM | POA: Insufficient documentation

## 2022-03-15 DIAGNOSIS — Z9221 Personal history of antineoplastic chemotherapy: Secondary | ICD-10-CM | POA: Insufficient documentation

## 2022-03-15 DIAGNOSIS — Z853 Personal history of malignant neoplasm of breast: Secondary | ICD-10-CM | POA: Diagnosis not present

## 2022-03-15 LAB — ECHOCARDIOGRAM COMPLETE
Area-P 1/2: 4.05 cm2
S' Lateral: 2.5 cm

## 2022-03-15 NOTE — Patient Instructions (Signed)
There has been no changes to your medications.  Your physician has requested that you have an echocardiogram. Echocardiography is a painless test that uses sound waves to create images of your heart. It provides your doctor with information about the size and shape of your heart and how well your heart's chambers and valves are working. This procedure takes approximately one hour. There are no restrictions for this procedure.   Your physician recommends that you schedule a follow-up appointment in: 3 months ( February 2024)  ** call the office in November to arrange your follow up in appointment **  If you have any questions or concerns before your next appointment please send [place] a message through [place] or call our office at [phone].    TO LEAVE A MESSAGE FOR THE NURSE SELECT OPTION 2, PLEASE LEAVE A MESSAGE INCLUDING: YOUR NAME DATE OF BIRTH CALL BACK NUMBER REASON FOR CALL**this is important as we prioritize the call backs  YOU WILL RECEIVE A CALL BACK THE SAME DAY AS LONG AS YOU CALL BEFORE 4:00 PM  At the [facility], you and your health needs are our priority. As part of our continuing mission to provide you with exceptional heart care, we have created designated Provider Care Teams. These Care Teams include your primary Cardiologist (physician) and Advanced Practice Providers (APPs- Physician Assistants and Nurse Practitioners) who all work together to provide you with the care you need, when you need it.   You may see any of the following providers on your designated Care Team at your next follow up: Dr Glori Bickers Dr Loralie Champagne Dr. Roxana Hires, NP Lyda Jester, [place] [facility] [place], [place] Forestine Na, NP Audry Riles, PharmD   Please be sure to bring in all your medications bottles to every appointment.

## 2022-03-16 NOTE — Progress Notes (Signed)
Oncology: Dr. Chryl Heck  51 y.o. with history of stage IV triple positive breast cancer presents for cardio-oncology evaluation.  Initial diagnosis in 1/14.  Bilateral mastectomy, ER+/PR+/HER2+.  She was treated with carboplatin/docetaxel/trastuzumab x 6 cycles then trastuzumab alone to complete 1 year.  She had radiation as well. In 4/23, patient was found to have recurrence with left malignant pleural effusion. She has completed chemo with docetaxel/trastuzumab/pertuzumab and will continue trastuzumab x 1 year.    Echo was done today and reviewed, EF 60-65% with GLS -20.4% (stable).   Patient did not have cardiac complications from her initial chemotherapy involving trastuzumab in 2014.  She does have treated HTN.  No family history of cardiomyopathy or early CAD.  Nonsmoker.  No exertional dyspnea or chest pain.  She continues on trastuzumab.   ECG (personally reviewed): NSR rate 91, normal.   Labs (5/23): K 3.3, creatinine 0.64  PMH: 1. HTN 2. Migraines 3. Breast cancer: Initial diagnosis in 1/14.  Bilateral mastectomy, ER+/PR+/HER2+.  She was treated with carboplatin/docetaxel/trastuzumab x 6 cycles then trastuzumab alone to complete 1 year.  She had radiation.  - 4/23 patient had recurrence with left malignant pleural effusion found. She has started chemo with docetaxel/trastuzumab/pertuzumab.  - Echo (4/23): EF 60-65%, GLS -16.8%, RV normal.  - Echo (7/23): EF 65-70%, GLS -21.3%, RV normal.  - Echo (10/23): EF 60-65%, GLS -43.3%, normal diastolic function, normal RV, mild MR  Social History   Socioeconomic History   Marital status: Married    Spouse name: Not on file   Number of children: 2   Years of education: Not on file   Highest education level: Not on file  Occupational History    Employer: [employer]  Tobacco Use   Smoking status: Never   Smokeless tobacco: Never  Substance and Sexual Activity   Alcohol use: Yes    Comment: occasional   Drug use: No   Sexual  activity: Yes    Birth control/protection: Surgical  Other Topics Concern   Not on file  Social History Narrative   Not on file   Social Determinants of Health   Financial Resource Strain: Not on file  Food Insecurity: Not on file  Transportation Needs: Not on file  Physical Activity: Not on file  Stress: Not on file  Social Connections: Not on file  Intimate Partner Violence: Not on file   Family History  Problem Relation Age of Onset   Hypertension Mother    Alcohol abuse Mother    Heart disease Maternal Grandmother    Stroke Maternal Grandfather    Colon cancer Maternal Aunt 36       alive at 77   Brain cancer Maternal Uncle 46       and lymphoma in early 25s; deceased   Brain cancer Maternal Uncle 65       deceased   Pancreatic cancer Maternal Uncle 59       alive at 44   Breast cancer Cousin 54       mat 1st cousin once removed through mat GF ; deceased   Breast cancer Maternal Aunt        great aunt through mat GF; dx at ? age   ROS: All systems reviewed and negative except as per HPI.   Current Outpatient Medications  Medication Sig Dispense Refill   acetaminophen (TYLENOL) 500 MG tablet Take 1,000 mg by mouth every 6 (six) hours as needed for moderate pain.     amLODipine (NORVASC)  10 MG tablet Take 1 tablet (10 mg total) by mouth every morning. 30 tablet 3   KLOR-CON M20 20 MEQ tablet TAKE 1 TABLET BY MOUTH EVERY DAY 30 tablet 0   lidocaine-prilocaine (EMLA) cream Apply 1 application. topically as needed. 30 g 0   LORazepam (ATIVAN) 0.5 MG tablet Take 1 tablet (0.5 mg total) by mouth every 6 (six) hours as needed for anxiety. 30 tablet 0   metoprolol tartrate (LOPRESSOR) 25 MG tablet Take 0.5 tablets (12.5 mg total) by mouth 2 (two) times daily. 60 tablet 0   pantoprazole (PROTONIX) 40 MG tablet Take 40 mg by mouth as needed.     tamoxifen (NOLVADEX) 20 MG tablet Take 1 tablet (20 mg total) by mouth daily. 90 tablet 3   No current facility-administered  medications for this encounter.   Facility-Administered Medications Ordered in Other Encounters  Medication Dose Route Frequency Provider Last Rate Last Admin   acetaminophen (TYLENOL) 325 MG tablet            diphenhydrAMINE (BENADRYL) 25 mg capsule            BP 120/80   Pulse 69   Wt 69.8 kg (153 lb 12.8 oz)   SpO2 98%   BMI 28.13 kg/m  General: NAD Neck: No JVD, no thyromegaly or thyroid nodule.  Lungs: Clear to auscultation bilaterally with normal respiratory effort. CV: Nondisplaced PMI.  Heart regular S1/S2, no S3/S4, no murmur.  No peripheral edema.  No carotid bruit.  Normal pedal pulses.  Abdomen: Soft, nontender, no hepatosplenomegaly, no distention.  Skin: Intact without lesions or rashes.  Neurologic: Alert and oriented x 3.  Psych: Normal affect. Extremities: No clubbing or cyanosis.  HEENT: Normal.   Assessment/Plan: 1. Stage IV triple positive breast cancer: Patient had trastuzumab as part of her therapy back in 2014 with no cardiac complications.  She has started trastuzumab-based therapy again.  Today's echo shows stable LVEF and global longitudinal strain.  We have discussed the possible cardio-toxicity of trastuzumab and the reasoning behind serial echoes.   - I will obtain repeat echo in 3 months.  2. HTN: BP controlled on current amlodipine, continue.   Loralie Champagne 03/16/2022

## 2022-03-19 ENCOUNTER — Ambulatory Visit (HOSPITAL_COMMUNITY)
Admission: RE | Admit: 2022-03-19 | Discharge: 2022-03-19 | Disposition: A | Discharge status: Home / Self Care | Payer: Federal, State, Local not specified - PPO | Source: Ambulatory Visit | Attending: Hematology and Oncology | Admitting: Hematology and Oncology

## 2022-03-19 DIAGNOSIS — C50211 Malignant neoplasm of upper-inner quadrant of right female breast: Secondary | ICD-10-CM | POA: Diagnosis not present

## 2022-03-19 DIAGNOSIS — C7951 Secondary malignant neoplasm of bone: Secondary | ICD-10-CM | POA: Diagnosis not present

## 2022-03-19 DIAGNOSIS — Z170 Estrogen receptor positive status [ER+]: Secondary | ICD-10-CM | POA: Insufficient documentation

## 2022-03-19 DIAGNOSIS — K769 Liver disease, unspecified: Secondary | ICD-10-CM | POA: Diagnosis not present

## 2022-03-19 DIAGNOSIS — C50919 Malignant neoplasm of unspecified site of unspecified female breast: Secondary | ICD-10-CM | POA: Diagnosis not present

## 2022-03-19 DIAGNOSIS — J90 Pleural effusion, not elsewhere classified: Secondary | ICD-10-CM | POA: Diagnosis not present

## 2022-03-19 DIAGNOSIS — J9811 Atelectasis: Secondary | ICD-10-CM | POA: Diagnosis not present

## 2022-03-19 DIAGNOSIS — K449 Diaphragmatic hernia without obstruction or gangrene: Secondary | ICD-10-CM | POA: Diagnosis not present

## 2022-03-19 MED ORDER — SODIUM CHLORIDE (PF) 0.9 % IJ SOLN
INTRAMUSCULAR | Status: AC
  Filled 2022-03-19: qty 50

## 2022-03-19 MED ORDER — IOHEXOL 300 MG/ML  SOLN
100.0000 mL | Freq: Once | INTRAMUSCULAR | Status: AC | PRN
  Administered 2022-03-19: 100 mL via INTRAVENOUS

## 2022-03-21 ENCOUNTER — Other Ambulatory Visit: Payer: Self-pay | Admitting: Physician Assistant

## 2022-03-23 ENCOUNTER — Encounter: Payer: Self-pay | Admitting: Hematology and Oncology

## 2022-03-26 ENCOUNTER — Inpatient Hospital Stay: Payer: Federal, State, Local not specified - PPO

## 2022-03-26 ENCOUNTER — Encounter: Payer: Self-pay | Admitting: Hematology and Oncology

## 2022-03-26 ENCOUNTER — Inpatient Hospital Stay
Payer: Federal, State, Local not specified - PPO | Attending: Hematology and Oncology | Admitting: Hematology and Oncology

## 2022-03-26 VITALS — BP 125/90 | HR 84 | Temp 97.7°F | Resp 16 | Wt 153.4 lb

## 2022-03-26 DIAGNOSIS — R000 Tachycardia, unspecified: Secondary | ICD-10-CM | POA: Diagnosis not present

## 2022-03-26 DIAGNOSIS — Z5111 Encounter for antineoplastic chemotherapy: Secondary | ICD-10-CM | POA: Insufficient documentation

## 2022-03-26 DIAGNOSIS — C50211 Malignant neoplasm of upper-inner quadrant of right female breast: Secondary | ICD-10-CM | POA: Diagnosis not present

## 2022-03-26 DIAGNOSIS — C7951 Secondary malignant neoplasm of bone: Secondary | ICD-10-CM | POA: Insufficient documentation

## 2022-03-26 DIAGNOSIS — Z9221 Personal history of antineoplastic chemotherapy: Secondary | ICD-10-CM | POA: Diagnosis not present

## 2022-03-26 DIAGNOSIS — Z923 Personal history of irradiation: Secondary | ICD-10-CM | POA: Insufficient documentation

## 2022-03-26 DIAGNOSIS — Z170 Estrogen receptor positive status [ER+]: Secondary | ICD-10-CM

## 2022-03-26 DIAGNOSIS — Z5112 Encounter for antineoplastic immunotherapy: Secondary | ICD-10-CM | POA: Diagnosis not present

## 2022-03-26 LAB — CMP (CANCER CENTER ONLY)
ALT: 9 U/L (ref 0–44)
AST: 15 U/L (ref 15–41)
Albumin: 4.1 g/dL (ref 3.5–5.0)
Alkaline Phosphatase: 97 U/L (ref 38–126)
Anion gap: 4 — ABNORMAL LOW (ref 5–15)
BUN: 11 mg/dL (ref 6–20)
CO2: 28 mmol/L (ref 22–32)
Calcium: 8.8 mg/dL — ABNORMAL LOW (ref 8.9–10.3)
Chloride: 110 mmol/L (ref 98–111)
Creatinine: 0.66 mg/dL (ref 0.44–1.00)
GFR, Estimated: >60 mL/min (ref >60)
Glucose, Bld: 102 mg/dL — ABNORMAL HIGH (ref 70–99)
Potassium: 3.7 mmol/L (ref 3.5–5.1)
Sodium: 142 mmol/L (ref 135–145)
Total Bilirubin: 0.4 mg/dL (ref 0.3–1.2)
Total Protein: 6.6 g/dL (ref 6.5–8.1)

## 2022-03-26 LAB — CBC WITH DIFFERENTIAL (CANCER CENTER ONLY)
Abs Immature Granulocytes: 0.01 10*3/uL (ref 0.00–0.07)
Basophils Absolute: 0 10*3/uL (ref 0.0–0.1)
Basophils Relative: 1 %
Eosinophils Absolute: 0.3 10*3/uL (ref 0.0–0.5)
Eosinophils Relative: 4 %
HCT: 39.4 % (ref 36.0–46.0)
Hemoglobin: 13.4 g/dL (ref 12.0–15.0)
Immature Granulocytes: 0 %
Lymphocytes Relative: 23 %
Lymphs Abs: 1.5 10*3/uL (ref 0.7–4.0)
MCH: 28.7 pg (ref 26.0–34.0)
MCHC: 34 g/dL (ref 30.0–36.0)
MCV: 84.4 fL (ref 80.0–100.0)
Monocytes Absolute: 0.4 10*3/uL (ref 0.1–1.0)
Monocytes Relative: 6 %
Neutro Abs: 4.4 10*3/uL (ref 1.7–7.7)
Neutrophils Relative %: 66 %
Platelet Count: 261 10*3/uL (ref 150–400)
RBC: 4.67 MIL/uL (ref 3.87–5.11)
RDW: 13.3 % (ref 11.5–15.5)
WBC Count: 6.7 10*3/uL (ref 4.0–10.5)
nRBC: 0 % (ref 0.0–0.2)

## 2022-03-26 MED ORDER — SODIUM CHLORIDE 0.9% FLUSH
10.0000 mL | INTRAVENOUS | Status: DC | PRN
  Administered 2022-03-26: 10 mL

## 2022-03-26 MED ORDER — DIPHENHYDRAMINE HCL 25 MG PO CAPS
25.0000 mg | ORAL_CAPSULE | Freq: Once | ORAL | Status: AC
  Administered 2022-03-26: 25 mg via ORAL
  Filled 2022-03-26: qty 1

## 2022-03-26 MED ORDER — TRASTUZUMAB-DKST CHEMO 150 MG IV SOLR
6.0000 mg/kg | Freq: Once | INTRAVENOUS | Status: AC
  Administered 2022-03-26: 441 mg via INTRAVENOUS
  Filled 2022-03-26: qty 21

## 2022-03-26 MED ORDER — HEPARIN SOD (PORK) LOCK FLUSH 100 UNIT/ML IV SOLN
500.0000 [IU] | Freq: Once | INTRAVENOUS | Status: AC | PRN
  Administered 2022-03-26: 500 [IU]

## 2022-03-26 MED ORDER — ACETAMINOPHEN 325 MG PO TABS
650.0000 mg | ORAL_TABLET | Freq: Once | ORAL | Status: AC
  Administered 2022-03-26: 650 mg via ORAL
  Filled 2022-03-26: qty 2

## 2022-03-26 MED ORDER — SODIUM CHLORIDE 0.9 % IV SOLN
420.0000 mg | Freq: Once | INTRAVENOUS | Status: AC
  Administered 2022-03-26: 420 mg via INTRAVENOUS
  Filled 2022-03-26: qty 14

## 2022-03-26 MED ORDER — SODIUM CHLORIDE 0.9 % IV SOLN: Freq: Once | INTRAVENOUS | Status: AC

## 2022-03-26 MED ORDER — SODIUM CHLORIDE 0.9% FLUSH
10.0000 mL | Freq: Once | INTRAVENOUS | Status: AC
  Administered 2022-03-26: 10 mL

## 2022-03-26 NOTE — Progress Notes (Signed)
[facility] Follow up:    Sherry Congress, NP [place] [address]   DIAGNOSIS:  Cancer Staging  Malignant neoplasm of upper-inner quadrant of right breast in female, estrogen receptor positive ([name]) Staging form: Breast, AJCC 7th Edition - Clinical: Stage IA (T1c, N0, cM0) - Unsigned Specimen type: Core Needle Biopsy Histopathologic type: 9931 Laterality: Right Staging comments: Staged at breast conference 1.15.14  - Pathologic stage from 07/14/2012: Stage IV ([name], NX, M1) - Signed by [name], MD on 08/28/2021 Specimen type: Core Needle Biopsy Histopathologic type: 9931 Laterality: Right   SUMMARY OF ONCOLOGIC HISTORY: Oncology History  Malignant neoplasm of upper-inner quadrant of right breast in female, estrogen receptor positive ([place])  07/13/2012 Clinical Stage   Stage IA: T1c N0   07/14/2012 Definitive Surgery   Bilateral mastectomy/right SLNB: RIGHT invasive ductal carcinoma, grade 3, ER+, PR +, Her2/neu positive (ratio 3.02), Ki67 48%. DCIS. 1/4 LN positive for malignancy. LEFT: benign   07/14/2012 Pathologic Stage   Stage IIB: T2 N1a M0   07/14/2012 Cancer Staging   Staging form: Breast, AJCC 7th Edition - Pathologic stage from 07/14/2012: Stage IV ([place], NX, M1) - Signed by [name], MD on 08/28/2021 Specimen type: Core Needle Biopsy Histopathologic type: 9931 Laterality: Right   08/17/2012 - 08/30/2013 Chemotherapy   Adjuvant carboplatin, docetaxel, and trastuzumab x 6 cycles (completed 11/30/2012) followed by maintenance trastuzumab to total one year   11/2012 Procedure   Comp Cancer Gene panel (Gene[facility]) negative for deleterious mutations    - 02/2013 Radiation Therapy   Adjuvant RT to right breast   02/2013 -  Anti-estrogen oral therapy   Tamoxifen 20 mg daily   09/24/2013 Surgery   Bilateral breast reconstruction with latissmus flap and expander placement   12/12/2013 Surgery   Implant placement     Relapse/Recurrence   Left sided malignant pleural effusion.  Tumor cells are positive for GATA3 and ER, negative for TTF-1 consistent with recurrent breast carcinoma Prognostic showed ER 90% positive strong staining, PR 80% positive strong staining, HER2 negative.    09/16/2021 Imaging   PET scan showed malignant left pleural effusion with extensive areas of nodularity and hypermetabolic activity involving the mediastinal border and pericardium as well as the most inferior aspects of the costodiaphragmatic recess.  Signs of nodal disease at the thoracic inlet on the left suspect extension of disease below the diaphragm along the left anterolateral aorta.  Nonspecific moderate to markedly increased metabolic activity about the base of the tongue bilaterally.  Consider direct visualization showing mildly asymmetric uptake favoring the right lingual tonsil.  Sclerotic foci without marked increased metabolic activity suspicious for metastatic disease perhaps treated or questions.  Heterogeneous marrow uptake seen elsewhere generalized and nonspecific.  Consider spinal MRI is warranted  MRI brain without any evidence of intracranial metastatic disease.   10/09/2021 - 02/11/2022 Chemotherapy   Patient is on Treatment Plan : BREAST DOCEtaxel + Trastuzumab + Pertuzumab (THP) q21d x 8 cycles / Trastuzumab + Pertuzumab q21d x 4 cycles     10/09/2021 -  Chemotherapy   Patient is on Treatment Plan : BREAST DOCEtaxel + Trastuzumab + Pertuzumab (THP) q21d x 8 cycles / Trastuzumab + Pertuzumab q21d x 4 cycles     12/04/2021 Imaging   There is no evidence new metastatic disease. There is interval decrease in the left pleural effusion possibly suggesting resolving pleural metastatic disease. Few scattered sclerotic metastatic lesions in the skeletal structures have not changed significantly.  No acute findings are seen in the chest abdomen and pelvis.       CURRENT THERAPY: THP  INTERVAL HISTORY:  Sherry Barry  Sherry Barry 51 y.o. female returns for follow-up Since last visit, she has received her first dose of Zometa.  She felt achiness in her bones after this.  She otherwise also received Herceptin and pertuzumab.  She most recently had imaging.  She was worried about the report results with said no change.  She is otherwise feeling well. Rest of the pertinent 10 point ROS reviewed and negative.  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  MEDICAL HISTORY: Past Medical History:  Diagnosis Date   Allergy    Breast cancer ([place])    Breast cancer ([place])    right   History of chemotherapy    doxetaxel/carboplatin/trastuzumab   History of migraine    last one about a week ago   Hx of radiation therapy 01/01/13- 02/15/13   r chest wall, r supraclav/axilla 5040 cGy/28 sessions, scar boost 1000 cGy/5 sessions   Hypertension    no meds,   urgent care on [place]ana   Migraine    Migraine     SURGICAL HISTORY: Past Surgical History:  Procedure Laterality Date   ABDOMINAL HYSTERECTOMY     no salpingo-oophorectomy 2009   BREAST RECONSTRUCTION WITH PLACEMENT OF TISSUE EXPANDER AND FLEX HD (ACELLULAR HYDRATED DERMIS) Left 09/24/2013   IR IMAGING GUIDED PORT INSERTION  10/07/2021   IR  THORACENTESIS ASP PLEURAL SPACE W/IMG GUIDE  08/11/2021   IR THORACENTESIS ASP PLEURAL SPACE W/IMG GUIDE  10/07/2021   LATISSIMUS FLAP TO BREAST Right 09/24/2013   Procedure: RIGHT LATISSMUS MYOCUTAEIOUS MUSCLE FLAP AND PLACEMENT OF TISSUE [name];  Surgeon: Theodoro Kos, DO;  Location: [facility];  Service: Plastics;  Laterality: Right;   LIPOSUCTION WITH LIPOFILLING Bilateral 12/12/2013   Procedure: LIPOSUCTION WITH LIPOFILLING;  Surgeon: Theodoro Kos, DO;  Location: [facility];  Service: Plastics;  Laterality: Bilateral;   PORT-A-CATH REMOVAL Left 12/12/2013   Procedure: REMOVAL PORT-A-CATH;  Surgeon: Theodoro Kos, DO;  Location: [facility];  Service: Plastics;  Laterality: Left;   PORTACATH PLACEMENT  07/14/2012   Procedure: INSERTION PORT-A-CATH;  Surgeon: Joyice Faster. Cornett, MD;  Location: [facility];  Service: General;  Laterality: Left;   RECONSTRUCTION BREAST W/ LATISSIMUS DORSI FLAP Right 09/24/2013   "& tissue expander placement"   REMOVAL OF BILATERAL TISSUE EXPANDERS WITH PLACEMENT OF BILATERAL BREAST IMPLANTS Bilateral 12/12/2013   Procedure: REMOVAL OF BILATERAL TISSUE EXPANDERS WITH PLACEMENT OF BILATERAL BREAST IMPLANTS/BILATERAL CAPSULECTOMIES WITH  LIPOFILLING FAT GRAFTING;  Surgeon: Theodoro Kos, DO;  Location: [facility];  Service: Plastics;  Laterality: Bilateral;   SIMPLE MASTECTOMY WITH AXILLARY SENTINEL NODE BIOPSY  07/14/2012   Procedure: SIMPLE MASTECTOMY WITH AXILLARY SENTINEL NODE BIOPSY;  Surgeon: Joyice Faster. Cornett, MD;  Location: [facility];  Service: General;  Laterality: Right;  Bilateral simple mastectomy with port and right sebtibel lymph node mapping   SIMPLE MASTECTOMY WITH AXILLARY SENTINEL NODE BIOPSY  07/14/2012   Procedure: SIMPLE MASTECTOMY;  Surgeon: Joyice Faster. Cornett, MD;  Location: [facility];  Service: General;  Laterality: Left;   TISSUE EXPANDER PLACEMENT Left 09/24/2013   Procedure: PLACEMENT OF TISSUE EXPANDER AND FLEX HD TO LEFT  BREAST;  Surgeon: Theodoro Kos, DO;  Location: [facility];  Service: Plastics;  Laterality: Left;    SOCIAL HISTORY: Social History   Socioeconomic History   Marital status: Married    Spouse name: Not on file   Number of children: 2   Years of education: Not on file   Highest education level: Not on file  Occupational History    Employer: [employer]  Tobacco Use   Smoking status: Never   Smokeless tobacco: Never  Substance and Sexual Activity   Alcohol use: Yes    Comment: occasional   Drug use: No   Sexual activity: Yes    Birth control/protection: Surgical  Other Topics Concern   Not on file  Social History Narrative   Not on file   Social Determinants of Health   Financial Resource Strain: Not on file  Food Insecurity: Not on file  Transportation Needs: Not on file  Physical Activity: Not on file  Stress: Not on file  Social Connections: Not on file  Intimate Partner Violence: Not on file    FAMILY HISTORY: Family History  Problem Relation Age of Onset   Hypertension Mother    Alcohol abuse Mother    Heart disease Maternal Grandmother    Stroke Maternal Grandfather    Colon cancer Maternal Aunt 62       alive at 69   Brain cancer Maternal Uncle 57       and lymphoma in early 12s; deceased   Brain cancer Maternal Uncle 60       deceased   Pancreatic cancer Maternal Uncle 57       alive at 24   Breast cancer Cousin 32       mat 1st cousin once removed through mat GF ; deceased   Breast cancer Maternal Aunt        great aunt through mat GF; dx at ? age    Review of Systems  Constitutional:  Positive for fatigue. Negative for appetite change, chills, fever and unexpected weight change.  HENT:   Negative for hearing loss, lump/mass and trouble swallowing.   Eyes:  Negative for eye problems and icterus.  Respiratory:  Negative for chest tightness and cough.   Cardiovascular:  Negative for chest pain, leg swelling and palpitations.  Gastrointestinal:   Negative for abdominal distention, abdominal pain, constipation, diarrhea, nausea and vomiting.  Endocrine: Negative for hot flashes.  Genitourinary:  Negative for difficulty urinating.   Musculoskeletal:  Positive for arthralgias and myalgias.  Skin:  Negative for itching and rash.  Neurological:  Negative for dizziness, extremity weakness, headaches and numbness.  Hematological:  Negative for adenopathy. Does not bruise/bleed easily.  Psychiatric/Behavioral:  Negative for depression. The patient is not nervous/anxious.     PHYSICAL EXAMINATION  ECOG PERFORMANCE STATUS: 1 - Symptomatic but completely ambulatory  Vitals:   03/26/22 1346  BP: (!) 125/90  Pulse: 84  Resp: 16  Temp: 97.7 F (36.5 C)  SpO2: 99%     Physical Exam Constitutional:      General: She is not in acute  distress.    Appearance: Normal appearance. She is not toxic-appearing.  HENT:     Head: Normocephalic and atraumatic.  Eyes:     General: No scleral icterus. Cardiovascular:     Rate and Rhythm: Regular rhythm.     Pulses: Normal pulses.     Heart sounds: Normal heart sounds.  Pulmonary:     Effort: Pulmonary effort is normal.     Breath sounds: Normal breath sounds.  Abdominal:     General: Abdomen is flat. Bowel sounds are normal. There is no distension.     Palpations: Abdomen is soft.     Tenderness: There is no abdominal tenderness.  Musculoskeletal:        General: No swelling.     Cervical back: Neck supple.  Lymphadenopathy:     Cervical: No cervical adenopathy.  Skin:    General: Skin is warm and dry.     Findings: No rash.  Neurological:     General: No focal deficit present.     Mental Status: She is alert.  Psychiatric:        Mood and Affect: Mood normal.        Behavior: Behavior normal.     LABORATORY DATA:  CBC    Component Value Date/Time   WBC 6.7 03/26/2022 1329   WBC 20.5 (H) 02/11/2022 1100   RBC 4.67 03/26/2022 1329   HGB 13.4 03/26/2022 1329   HGB 15.9  10/30/2019 1030   HGB 14.7 02/15/2017 0852   HCT 39.4 03/26/2022 1329   HCT 46.1 10/30/2019 1030   HCT 42.9 02/15/2017 0852   PLT 261 03/26/2022 1329   PLT 245 10/30/2019 1030   MCV 84.4 03/26/2022 1329   MCV 88 10/30/2019 1030   MCV 86.7 02/15/2017 0852   MCH 28.7 03/26/2022 1329   MCHC 34.0 03/26/2022 1329   RDW 13.3 03/26/2022 1329   RDW 12.5 10/30/2019 1030   RDW 12.6 02/15/2017 0852   LYMPHSABS 1.5 03/26/2022 1329   LYMPHSABS 1.4 02/15/2017 0852   MONOABS 0.4 03/26/2022 1329   MONOABS 0.3 02/15/2017 0852   EOSABS 0.3 03/26/2022 1329   EOSABS 0.1 02/15/2017 0852   BASOSABS 0.0 03/26/2022 1329   BASOSABS 0.0 02/15/2017 0852    CMP     Component Value Date/Time   NA 142 03/05/2022 1252   NA 140 10/30/2019 1030   NA 141 02/15/2017 0852   K 3.3 (L) 03/05/2022 1252   K 3.9 02/15/2017 0852   CL 109 03/05/2022 1252   CL 101 11/15/2012 0904   CO2 28 03/05/2022 1252   CO2 25 02/15/2017 0852   GLUCOSE 91 03/05/2022 1252   GLUCOSE 87 02/15/2017 0852   GLUCOSE 69 (L) 11/15/2012 0904   BUN 12 03/05/2022 1252   BUN 11 10/30/2019 1030   BUN 12.5 02/15/2017 0852   CREATININE 0.60 03/05/2022 1252   CREATININE 0.9 02/15/2017 0852   CALCIUM 9.0 03/05/2022 1252   CALCIUM 9.3 02/15/2017 0852   PROT 6.3 (L) 03/05/2022 1252   PROT 6.7 10/30/2019 1030   PROT 7.0 02/15/2017 0852   ALBUMIN 4.0 03/05/2022 1252   ALBUMIN 4.2 10/30/2019 1030   ALBUMIN 3.6 02/15/2017 0852   AST 15 03/05/2022 1252   AST 16 02/15/2017 0852   ALT 9 03/05/2022 1252   ALT 13 02/15/2017 0852   ALKPHOS 102 03/05/2022 1252   ALKPHOS 63 02/15/2017 0852   BILITOT 0.4 03/05/2022 1252   BILITOT 0.69 02/15/2017 0852   GFRNONAA >60 03/05/2022  [place] 10/30/2019 1030     ASSESSMENT and THERAPY PLAN:  Kaylynne Andres is a 51 y.o. female who returns for a follow-up for stage IV triple positive breast cancer.  #Stage IV triple positive breast cancer: --Currently treatment includes Taxotere,  Herceptin and Perjeta. --CT imaging on 12/05/2018 3:23 cycles showed no new evidence of metastatic disease, decrease in left pleural effusion suggesting resolving pleural metastatic disease few second scattered sclerotic metastatic lesions which have not changed significantly.  Overall these findings are consistent with response.   -- She is now on Herceptin and pertuzumab maintenance.  Last echocardiogram satisfactory.  She will be due for another echocardiogram in January.  Most recent imaging with no change.  No concerns for progression. I have reassured her about this finding and we have reviewed these results today. --She will continue Herceptin and Perjeta and return to clinic for follow-up every 6 weeks and infusion every 21 days.  Zometa is every 12 weeks.  She denies any new dental concerns.  #Tachycardia:  She is followed by Dr. Aundra Dubin from cardio oncology.  She will continue trastuzumab and she will repeat serial echocardiograms.  Most recent echo appears satisfactory from oncology standpoint.  Follow up:  She understands that the treatment is indefinite as long as it works for her or as long as there is no unacceptable toxicity. Continue tamoxifen, Herceptin and pertuzumab maintenance.   Return to clinic in 6 weeks  Total time spent: 30 minutes

## 2022-03-26 NOTE — Patient Instructions (Signed)
[facility] MEDICAL ONCOLOGY  Discharge Instructions: Thank you for choosing [facility] to provide your oncology and hematology care.   If you have a lab appointment with the Cancer Center, please go directly to the Cancer Center and check in at the registration area.   Wear comfortable clothing and clothing appropriate for easy access to any Portacath or PICC line.   We strive to give you quality time with your provider. You may need to reschedule your appointment if you arrive late (15 or more minutes).  Arriving late affects you and other patients whose appointments are after yours.  Also, if you miss three or more appointments without notifying the office, you may be dismissed from the clinic at the provider's discretion.      For prescription refill requests, have your pharmacy contact our office and allow 72 hours for refills to be completed.    Today you received the following chemotherapy and/or immunotherapy agents: trastuzumab and pertuzumab      To help prevent nausea and vomiting after your treatment, we encourage you to take your nausea medication as directed.  BELOW ARE SYMPTOMS THAT SHOULD BE REPORTED IMMEDIATELY: *FEVER GREATER THAN 100.4 F (38 C) OR HIGHER *CHILLS OR SWEATING *NAUSEA AND VOMITING THAT IS NOT CONTROLLED WITH YOUR NAUSEA MEDICATION *UNUSUAL SHORTNESS OF BREATH *UNUSUAL BRUISING OR BLEEDING *URINARY PROBLEMS (pain or burning when urinating, or frequent urination) *BOWEL PROBLEMS (unusual diarrhea, constipation, pain near the anus) TENDERNESS IN MOUTH AND THROAT WITH OR WITHOUT PRESENCE OF ULCERS (sore throat, sores in mouth, or a toothache) UNUSUAL RASH, SWELLING OR PAIN  UNUSUAL VAGINAL DISCHARGE OR ITCHING   Items with * indicate a potential emergency and should be followed up as soon as possible or go to the Emergency Department if any problems should occur.  Please show the CHEMOTHERAPY ALERT CARD or IMMUNOTHERAPY ALERT  CARD at check-in to the Emergency Department and triage nurse.  Should you have questions after your visit or need to cancel or reschedule your appointment, please contact [facility] MEDICAL ONCOLOGY  Dept: [phone]  and follow the prompts.  Office hours are 8:00 a.m. to 4:30 p.m. Monday - Friday. Please note that voicemails left after 4:00 p.m. may not be returned until the following business day.  We are closed weekends and major holidays. You have access to a nurse at all times for urgent questions. Please call the main number to the clinic Dept: [phone] and follow the prompts.   For any non-urgent questions, you may also contact your provider using MyChart. We now offer e-Visits for anyone 18 and older to request care online for non-urgent symptoms. For details visit [email or web address].   Also download the MyChart app! Go to the app store, search "MyChart", open the app, select [facility], and log in with your MyChart username and password.  Masks are optional in the cancer centers. If you would like for your care team to wear a mask while they are taking care of you, please let them know. You may have one support person who is at least 49 years old accompany you for your appointments. 

## 2022-03-29 ENCOUNTER — Other Ambulatory Visit: Payer: Self-pay | Admitting: Physician Assistant

## 2022-04-01 ENCOUNTER — Other Ambulatory Visit: Payer: Self-pay

## 2022-04-02 ENCOUNTER — Other Ambulatory Visit: Payer: Self-pay

## 2022-04-05 ENCOUNTER — Telehealth: Payer: Self-pay | Admitting: *Deleted

## 2022-04-05 NOTE — Telephone Encounter (Signed)
Pt call to make office aware of testing positive on 10/21 and requested to cancel 10/26 injection appt. Advised pt injection will be added on with 11/3 infusion appt. Pt verbalized understanding.

## 2022-04-08 ENCOUNTER — Inpatient Hospital Stay: Payer: Federal, State, Local not specified - PPO

## 2022-04-16 ENCOUNTER — Inpatient Hospital Stay: Payer: Federal, State, Local not specified - PPO | Attending: Hematology and Oncology

## 2022-04-16 ENCOUNTER — Inpatient Hospital Stay (HOSPITAL_BASED_OUTPATIENT_CLINIC_OR_DEPARTMENT_OTHER): Payer: Federal, State, Local not specified - PPO | Admitting: Hematology and Oncology

## 2022-04-16 ENCOUNTER — Encounter: Payer: Self-pay | Admitting: Hematology and Oncology

## 2022-04-16 VITALS — BP 122/98 | HR 75 | Temp 98.2°F | Resp 17 | Wt 155.0 lb

## 2022-04-16 DIAGNOSIS — Z170 Estrogen receptor positive status [ER+]: Secondary | ICD-10-CM | POA: Diagnosis not present

## 2022-04-16 DIAGNOSIS — R000 Tachycardia, unspecified: Secondary | ICD-10-CM

## 2022-04-16 DIAGNOSIS — Z5112 Encounter for antineoplastic immunotherapy: Secondary | ICD-10-CM | POA: Diagnosis not present

## 2022-04-16 DIAGNOSIS — C50211 Malignant neoplasm of upper-inner quadrant of right female breast: Secondary | ICD-10-CM | POA: Insufficient documentation

## 2022-04-16 DIAGNOSIS — Z9221 Personal history of antineoplastic chemotherapy: Secondary | ICD-10-CM | POA: Diagnosis not present

## 2022-04-16 DIAGNOSIS — Z803 Family history of malignant neoplasm of breast: Secondary | ICD-10-CM | POA: Diagnosis not present

## 2022-04-16 DIAGNOSIS — Z9013 Acquired absence of bilateral breasts and nipples: Secondary | ICD-10-CM | POA: Diagnosis not present

## 2022-04-16 DIAGNOSIS — C7951 Secondary malignant neoplasm of bone: Secondary | ICD-10-CM | POA: Diagnosis not present

## 2022-04-16 DIAGNOSIS — Z79899 Other long term (current) drug therapy: Secondary | ICD-10-CM | POA: Diagnosis not present

## 2022-04-16 DIAGNOSIS — I10 Essential (primary) hypertension: Secondary | ICD-10-CM | POA: Diagnosis not present

## 2022-04-16 DIAGNOSIS — Z800 Family history of malignant neoplasm of digestive organs: Secondary | ICD-10-CM | POA: Diagnosis not present

## 2022-04-16 DIAGNOSIS — Z923 Personal history of irradiation: Secondary | ICD-10-CM | POA: Insufficient documentation

## 2022-04-16 LAB — COMPREHENSIVE METABOLIC PANEL
ALT: 10 U/L (ref 0–44)
AST: 15 U/L (ref 15–41)
Albumin: 4.1 g/dL (ref 3.5–5.0)
Alkaline Phosphatase: 74 U/L (ref 38–126)
Anion gap: 6 (ref 5–15)
BUN: 15 mg/dL (ref 6–20)
CO2: 26 mmol/L (ref 22–32)
Calcium: 8.6 mg/dL — ABNORMAL LOW (ref 8.9–10.3)
Chloride: 109 mmol/L (ref 98–111)
Creatinine, Ser: 0.64 mg/dL (ref 0.44–1.00)
GFR, Estimated: >60 mL/min (ref >60)
Glucose, Bld: 134 mg/dL — ABNORMAL HIGH (ref 70–99)
Potassium: 3.6 mmol/L (ref 3.5–5.1)
Sodium: 141 mmol/L (ref 135–145)
Total Bilirubin: 0.4 mg/dL (ref 0.3–1.2)
Total Protein: 6.7 g/dL (ref 6.5–8.1)

## 2022-04-16 LAB — CBC WITH DIFFERENTIAL/PLATELET
Abs Immature Granulocytes: 0.01 10*3/uL (ref 0.00–0.07)
Basophils Absolute: 0 10*3/uL (ref 0.0–0.1)
Basophils Relative: 1 %
Eosinophils Absolute: 0.2 10*3/uL (ref 0.0–0.5)
Eosinophils Relative: 3 %
HCT: 39.6 % (ref 36.0–46.0)
Hemoglobin: 13.8 g/dL (ref 12.0–15.0)
Immature Granulocytes: 0 %
Lymphocytes Relative: 33 %
Lymphs Abs: 1.9 10*3/uL (ref 0.7–4.0)
MCH: 28.5 pg (ref 26.0–34.0)
MCHC: 34.8 g/dL (ref 30.0–36.0)
MCV: 81.8 fL (ref 80.0–100.0)
Monocytes Absolute: 0.4 10*3/uL (ref 0.1–1.0)
Monocytes Relative: 7 %
Neutro Abs: 3.2 10*3/uL (ref 1.7–7.7)
Neutrophils Relative %: 56 %
Platelets: 227 10*3/uL (ref 150–400)
RBC: 4.84 MIL/uL (ref 3.87–5.11)
RDW: 13.2 % (ref 11.5–15.5)
WBC: 5.6 10*3/uL (ref 4.0–10.5)
nRBC: 0 % (ref 0.0–0.2)

## 2022-04-16 MED ORDER — SODIUM CHLORIDE 0.9 % IV SOLN: Freq: Once | INTRAVENOUS | Status: AC

## 2022-04-16 MED ORDER — TRASTUZUMAB-DKST CHEMO 150 MG IV SOLR
6.0000 mg/kg | Freq: Once | INTRAVENOUS | Status: AC
  Administered 2022-04-16: 441 mg via INTRAVENOUS
  Filled 2022-04-16: qty 21

## 2022-04-16 MED ORDER — GOSERELIN ACETATE 10.8 MG ~~LOC~~ IMPL: 10.8000 mg | DRUG_IMPLANT | Freq: Once | SUBCUTANEOUS | Status: DC

## 2022-04-16 MED ORDER — SODIUM CHLORIDE 0.9% FLUSH
10.0000 mL | INTRAVENOUS | Status: DC | PRN
  Administered 2022-04-16: 10 mL

## 2022-04-16 MED ORDER — ACETAMINOPHEN 325 MG PO TABS
650.0000 mg | ORAL_TABLET | Freq: Once | ORAL | Status: AC
  Administered 2022-04-16: 650 mg via ORAL
  Filled 2022-04-16: qty 2

## 2022-04-16 MED ORDER — SODIUM CHLORIDE 0.9 % IV SOLN
420.0000 mg | Freq: Once | INTRAVENOUS | Status: AC
  Administered 2022-04-16: 420 mg via INTRAVENOUS
  Filled 2022-04-16: qty 14

## 2022-04-16 MED ORDER — HEPARIN SOD (PORK) LOCK FLUSH 100 UNIT/ML IV SOLN
500.0000 [IU] | Freq: Once | INTRAVENOUS | Status: AC | PRN
  Administered 2022-04-16: 500 [IU]

## 2022-04-16 MED ORDER — DIPHENHYDRAMINE HCL 25 MG PO CAPS
25.0000 mg | ORAL_CAPSULE | Freq: Once | ORAL | Status: AC
  Administered 2022-04-16: 25 mg via ORAL
  Filled 2022-04-16: qty 1

## 2022-04-16 NOTE — Progress Notes (Signed)
[facility] Follow up:    Sherry Congress, NP [place] [address]   DIAGNOSIS:  Cancer Staging  Malignant neoplasm of upper-inner quadrant of right breast in female, estrogen receptor positive ([name]) Staging form: Breast, AJCC 7th Edition - Clinical: Stage IA (T1c, N0, cM0) - Unsigned Specimen type: Core Needle Biopsy Histopathologic type: 9931 Laterality: Right Staging comments: Staged at breast conference 1.15.14  - Pathologic stage from 07/14/2012: Stage IV ([name], NX, M1) - Signed by [name], MD on 08/28/2021 Specimen type: Core Needle Biopsy Histopathologic type: 9931 Laterality: Right   SUMMARY OF ONCOLOGIC HISTORY: Oncology History  Malignant neoplasm of upper-inner quadrant of right breast in female, estrogen receptor positive ([place])  07/13/2012 Clinical Stage   Stage IA: T1c N0   07/14/2012 Definitive Surgery   Bilateral mastectomy/right SLNB: RIGHT invasive ductal carcinoma, grade 3, ER+, PR +, Her2/neu positive (ratio 3.02), Ki67 48%. DCIS. 1/4 LN positive for malignancy. LEFT: benign   07/14/2012 Pathologic Stage   Stage IIB: T2 N1a M0   07/14/2012 Cancer Staging   Staging form: Breast, AJCC 7th Edition - Pathologic stage from 07/14/2012: Stage IV ([place], NX, M1) - Signed by [name], MD on 08/28/2021 Specimen type: Core Needle Biopsy Histopathologic type: 9931 Laterality: Right   08/17/2012 - 08/30/2013 Chemotherapy   Adjuvant carboplatin, docetaxel, and trastuzumab x 6 cycles (completed 11/30/2012) followed by maintenance trastuzumab to total one year   11/2012 Procedure   Comp Cancer Gene panel ([facility]) negative for deleterious mutations    - 02/2013 Radiation Therapy   Adjuvant RT to right breast   02/2013 -  Anti-estrogen oral therapy   Tamoxifen 20 mg daily   09/24/2013 Surgery   Bilateral breast reconstruction with latissmus flap and expander placement   12/12/2013 Surgery   Implant placement     Relapse/Recurrence   Left sided malignant pleural effusion.  Tumor cells are positive for GATA3 and ER, negative for TTF-1 consistent with recurrent breast carcinoma Prognostic showed ER 90% positive strong staining, PR 80% positive strong staining, HER2 negative.    09/16/2021 Imaging   PET scan showed malignant left pleural effusion with extensive areas of nodularity and hypermetabolic activity involving the mediastinal border and pericardium as well as the most inferior aspects of the costodiaphragmatic recess.  Signs of nodal disease at the thoracic inlet on the left suspect extension of disease below the diaphragm along the left anterolateral aorta.  Nonspecific moderate to markedly increased metabolic activity about the base of the tongue bilaterally.  Consider direct visualization showing mildly asymmetric uptake favoring the right lingual tonsil.  Sclerotic foci without marked increased metabolic activity suspicious for metastatic disease perhaps treated or questions.  Heterogeneous marrow uptake seen elsewhere generalized and nonspecific.  Consider spinal MRI is warranted  MRI brain without any evidence of intracranial metastatic disease.   10/09/2021 - 02/11/2022 Chemotherapy   Patient is on Treatment Plan : BREAST DOCEtaxel + Trastuzumab + Pertuzumab (THP) q21d x 8 cycles / Trastuzumab + Pertuzumab q21d x 4 cycles     10/09/2021 -  Chemotherapy   Patient is on Treatment Plan : BREAST DOCEtaxel + Trastuzumab + Pertuzumab (THP) q21d x 8 cycles / Trastuzumab + Pertuzumab q21d x 4 cycles     12/04/2021 Imaging   There is no evidence new metastatic disease. There is interval decrease in the left pleural effusion possibly suggesting resolving pleural metastatic disease. Few scattered sclerotic metastatic lesions in the skeletal structures have not changed significantly.  No acute findings are seen in the chest abdomen and pelvis.       CURRENT THERAPY: THP  INTERVAL HISTORY:  Evgenia  Barry 51 y.o. female returns for follow-up Since last visit, she was diagnosed with COVID-19 on October 21.  She had some mild nasal congestion, cough but she has now recovered.  She is doing otherwise well.  No change in bowel habits, had some mild diarrhea with COVID-19 which once again has resolved. Rest of the pertinent 10 point ROS reviewed and negative.  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  MEDICAL HISTORY: Past Medical History:  Diagnosis Date   Allergy    Breast cancer ([place])    Breast cancer ([place])    right   History of chemotherapy    doxetaxel/carboplatin/trastuzumab   History of migraine    last one about a week ago   Hx of radiation therapy 01/01/13- 02/15/13   r chest wall, r supraclav/axilla 5040 cGy/28 sessions, scar boost 1000 cGy/5 sessions   Hypertension    no meds,   urgent care on [place]ana   Migraine    Migraine     SURGICAL HISTORY: Past Surgical History:  Procedure Laterality Date   ABDOMINAL HYSTERECTOMY     no salpingo-oophorectomy 2009   BREAST RECONSTRUCTION WITH PLACEMENT OF TISSUE EXPANDER AND FLEX HD (ACELLULAR HYDRATED DERMIS) Left 09/24/2013   IR IMAGING GUIDED PORT INSERTION  10/07/2021   IR THORACENTESIS ASP PLEURAL SPACE W/IMG  GUIDE  08/11/2021   IR THORACENTESIS ASP PLEURAL SPACE W/IMG GUIDE  10/07/2021   LATISSIMUS FLAP TO BREAST Right 09/24/2013   Procedure: RIGHT LATISSMUS MYOCUTAEIOUS MUSCLE FLAP AND PLACEMENT OF TISSUE [name];  Surgeon: Theodoro Kos, DO;  Location: [facility];  Service: Plastics;  Laterality: Right;   LIPOSUCTION WITH LIPOFILLING Bilateral 12/12/2013   Procedure: LIPOSUCTION WITH LIPOFILLING;  Surgeon: Theodoro Kos, DO;  Location: [facility];  Service: Plastics;  Laterality: Bilateral;   PORT-A-CATH REMOVAL Left 12/12/2013   Procedure: REMOVAL PORT-A-CATH;  Surgeon: Theodoro Kos, DO;  Location: [facility];  Service: Plastics;  Laterality: Left;   PORTACATH PLACEMENT  07/14/2012   Procedure: INSERTION PORT-A-CATH;  Surgeon: Joyice Faster. Cornett, MD;  Location: [facility];  Service: General;  Laterality: Left;   RECONSTRUCTION BREAST W/ LATISSIMUS DORSI FLAP Right 09/24/2013   "& tissue expander placement"   REMOVAL OF BILATERAL TISSUE EXPANDERS WITH PLACEMENT OF BILATERAL BREAST IMPLANTS Bilateral 12/12/2013   Procedure: REMOVAL OF BILATERAL TISSUE EXPANDERS WITH PLACEMENT OF BILATERAL BREAST IMPLANTS/BILATERAL CAPSULECTOMIES WITH  LIPOFILLING FAT GRAFTING;  Surgeon: Theodoro Kos, DO;  Location: [facility];  Service: Plastics;  Laterality: Bilateral;   SIMPLE MASTECTOMY WITH AXILLARY SENTINEL NODE BIOPSY  07/14/2012   Procedure: SIMPLE MASTECTOMY WITH AXILLARY SENTINEL NODE BIOPSY;  Surgeon: Joyice Faster. Cornett, MD;  Location: [facility];  Service: General;  Laterality:  Right;  Bilateral simple mastectomy with port and right sebtibel lymph node mapping   SIMPLE MASTECTOMY WITH AXILLARY SENTINEL NODE BIOPSY  07/14/2012   Procedure: SIMPLE MASTECTOMY;  Surgeon: Joyice Faster. Cornett, MD;  Location: [facility];  Service: General;  Laterality: Left;   TISSUE EXPANDER PLACEMENT Left 09/24/2013   Procedure: PLACEMENT OF TISSUE EXPANDER AND FLEX HD TO LEFT BREAST;  Surgeon: Theodoro Kos, DO;   Location: [facility];  Service: Plastics;  Laterality: Left;    SOCIAL HISTORY: Social History   Socioeconomic History   Marital status: Married    Spouse name: Not on file   Number of children: 2   Years of education: Not on file   Highest education level: Not on file  Occupational History    Employer: [employer]  Tobacco Use   Smoking status: Never   Smokeless tobacco: Never  Substance and Sexual Activity   Alcohol use: Yes    Comment: occasional   Drug use: No   Sexual activity: Yes    Birth control/protection: Surgical  Other Topics Concern   Not on file  Social History Narrative   Not on file   Social Determinants of Health   Financial Resource Strain: Not on file  Food Insecurity: Not on file  Transportation Needs: Not on file  Physical Activity: Not on file  Stress: Not on file  Social Connections: Not on file  Intimate Partner Violence: Not on file    FAMILY HISTORY: Family History  Problem Relation Age of Onset   Hypertension Mother    Alcohol abuse Mother    Heart disease Maternal Grandmother    Stroke Maternal Grandfather    Colon cancer Maternal Aunt 79       alive at 63   Brain cancer Maternal Uncle 31       and lymphoma in early 30s; deceased   Brain cancer Maternal Uncle 60       deceased   Pancreatic cancer Maternal Uncle 22       alive at 27   Breast cancer Cousin 38       mat 1st cousin once removed through mat GF ; deceased   Breast cancer Maternal Aunt        great aunt through mat GF; dx at ? age    Review of Systems  Constitutional:  Positive for fatigue. Negative for appetite change, chills, fever and unexpected weight change.  HENT:   Negative for hearing loss, lump/mass and trouble swallowing.   Eyes:  Negative for eye problems and icterus.  Respiratory:  Negative for chest tightness and cough.   Cardiovascular:  Negative for chest pain, leg swelling and palpitations.  Gastrointestinal:  Negative for abdominal distention,  abdominal pain, constipation, diarrhea, nausea and vomiting.  Endocrine: Negative for hot flashes.  Genitourinary:  Negative for difficulty urinating.   Musculoskeletal:  Positive for arthralgias and myalgias.  Skin:  Negative for itching and rash.  Neurological:  Negative for dizziness, extremity weakness, headaches and numbness.  Hematological:  Negative for adenopathy. Does not bruise/bleed easily.  Psychiatric/Behavioral:  Negative for depression. The patient is not nervous/anxious.     PHYSICAL EXAMINATION  ECOG PERFORMANCE STATUS: 1 - Symptomatic but completely ambulatory  There were no vitals filed for this visit.    Physical Exam Constitutional:      General: She is not in acute distress.    Appearance: Normal appearance. She is not toxic-appearing.  HENT:     Head: Normocephalic and atraumatic.  Eyes:     General: No scleral icterus. Cardiovascular:     Rate and Rhythm: Regular rhythm.     Pulses: Normal pulses.     Heart sounds: Normal heart sounds.  Pulmonary:     Effort: Pulmonary effort is normal.     Breath sounds: Normal breath sounds.  Abdominal:     General: Abdomen is flat. Bowel sounds are normal. There is no distension.     Palpations: Abdomen is soft.     Tenderness: There is no abdominal tenderness.  Musculoskeletal:        General: No swelling.     Cervical back: Neck supple.  Lymphadenopathy:     Cervical: No cervical adenopathy.  Skin:    General: Skin is warm and dry.     Findings: No rash.  Neurological:     General: No focal deficit present.     Mental Status: She is alert.  Psychiatric:        Mood and Affect: Mood normal.        Behavior: Behavior normal.     LABORATORY DATA:  CBC    Component Value Date/Time   WBC 6.7 03/26/2022 1329   WBC 20.5 (H) 02/11/2022 1100   RBC 4.67 03/26/2022 1329   HGB 13.4 03/26/2022 1329   HGB 15.9 10/30/2019 1030   HGB 14.7 02/15/2017 0852   HCT 39.4 03/26/2022 1329   HCT 46.1 10/30/2019  1030   HCT 42.9 02/15/2017 0852   PLT 261 03/26/2022 1329   PLT 245 10/30/2019 1030   MCV 84.4 03/26/2022 1329   MCV 88 10/30/2019 1030   MCV 86.7 02/15/2017 0852   MCH 28.7 03/26/2022 1329   MCHC 34.0 03/26/2022 1329   RDW 13.3 03/26/2022 1329   RDW 12.5 10/30/2019 1030   RDW 12.6 02/15/2017 0852   LYMPHSABS 1.5 03/26/2022 1329   LYMPHSABS 1.4 02/15/2017 0852   MONOABS 0.4 03/26/2022 1329   MONOABS 0.3 02/15/2017 0852   EOSABS 0.3 03/26/2022 1329   EOSABS 0.1 02/15/2017 0852   BASOSABS 0.0 03/26/2022 1329   BASOSABS 0.0 02/15/2017 0852    CMP     Component Value Date/Time   NA 142 03/26/2022 1329   NA 140 10/30/2019 1030   NA 141 02/15/2017 0852   K 3.7 03/26/2022 1329   K 3.9 02/15/2017 0852   CL 110 03/26/2022 1329   CL 101 11/15/2012 0904   CO2 28 03/26/2022 1329   CO2 25 02/15/2017 0852   GLUCOSE 102 (H) 03/26/2022 1329   GLUCOSE 87 02/15/2017 0852   GLUCOSE 69 (L) 11/15/2012 0904   BUN 11 03/26/2022 1329   BUN 11 10/30/2019 1030   BUN 12.5 02/15/2017 0852   CREATININE 0.66 03/26/2022 1329   CREATININE 0.9 02/15/2017 0852   CALCIUM 8.8 (L) 03/26/2022 1329   CALCIUM 9.3 02/15/2017 0852   PROT 6.6 03/26/2022 1329   PROT 6.7 10/30/2019 1030   PROT 7.0 02/15/2017 0852   ALBUMIN 4.1 03/26/2022 1329   ALBUMIN 4.2 10/30/2019 1030   ALBUMIN 3.6 02/15/2017 0852   AST 15 03/26/2022 1329   AST 16 02/15/2017 0852   ALT 9 03/26/2022 1329   ALT 13 02/15/2017 0852   ALKPHOS 97 03/26/2022 1329   ALKPHOS 63 02/15/2017 0852   BILITOT 0.4 03/26/2022 1329   BILITOT 0.69 02/15/2017 0852   GFRNONAA >60 03/26/2022 1329   GFRAA 100 10/30/2019 1030     ASSESSMENT and THERAPY PLAN:  Sherry Barry is a 51 y.o.  female who returns for a follow-up for stage IV triple positive breast cancer.  #Stage IV triple positive breast cancer: --Currently treatment includes Taxotere, Herceptin and Perjeta. --CT imaging on 12/05/2018 3:23 cycles showed no new evidence of  metastatic disease, decrease in left pleural effusion suggesting resolving pleural metastatic disease few second scattered sclerotic metastatic lesions which have not changed significantly.  Overall these findings are consistent with response.   -- She is now on Herceptin and pertuzumab maintenance.   -- Most recent echocardiogram with EF of 60 to 65%, normal function, no abnormalities.  She also follows up with Dr. Loralie Champagne from cardio oncology. --Okay to proceed with Herceptin and Perjeta if labs are within parameters.  Okay to proceed with Herceptin even before labs are available  She appears to have recovered well from COVID-19.  At this time there is no other recommendations. Follow up:  She understands that the treatment is indefinite as long as it works for her or as long as there is no unacceptable toxicity. Continue tamoxifen, Herceptin and pertuzumab maintenance.  Okay to proceed with Zoladex today since she missed her last week dose. Return to clinic in 6 weeks  Total time spent: 30 minutes

## 2022-04-16 NOTE — Patient Instructions (Signed)
[facility]   Discharge Instructions: Thank you for choosing [facility] to provide your oncology and hematology care.   If you have a lab appointment with the [facility], please go directly to the [facility] and check in at the registration area.   Wear comfortable clothing and clothing appropriate for easy access to any Portacath or PICC line.   We strive to give you quality time with your provider. You may need to reschedule your appointment if you arrive late (15 or more minutes).  Arriving late affects you and other patients whose appointments are after yours.  Also, if you miss three or more appointments without notifying the office, you may be dismissed from the clinic at the provider's discretion.      For prescription refill requests, have your pharmacy contact our office and allow 72 hours for refills to be completed.    Today you received the following chemotherapy and/or immunotherapy agents: trastuzumab and pertuzumab      To help prevent nausea and vomiting after your treatment, we encourage you to take your nausea medication as directed.  BELOW ARE SYMPTOMS THAT SHOULD BE REPORTED IMMEDIATELY: *FEVER GREATER THAN 100.4 F (38 C) OR HIGHER *CHILLS OR SWEATING *NAUSEA AND VOMITING THAT IS NOT CONTROLLED WITH YOUR NAUSEA MEDICATION *UNUSUAL SHORTNESS OF BREATH *UNUSUAL BRUISING OR BLEEDING *URINARY PROBLEMS (pain or burning when urinating, or frequent urination) *BOWEL PROBLEMS (unusual diarrhea, constipation, pain near the anus) TENDERNESS IN MOUTH AND THROAT WITH OR WITHOUT PRESENCE OF ULCERS (sore throat, sores in mouth, or a toothache) UNUSUAL RASH, SWELLING OR PAIN  UNUSUAL VAGINAL DISCHARGE OR ITCHING   Items with * indicate a potential emergency and should be followed up as soon as possible or go to the Emergency Department if any problems should occur.  Please show the CHEMOTHERAPY ALERT CARD or IMMUNOTHERAPY ALERT  CARD at check-in to the Emergency Department and triage nurse.  Should you have questions after your visit or need to cancel or reschedule your appointment, please contact [facility]  Dept: [phone]  and follow the prompts.  Office hours are 8:00 a.m. to 4:30 p.m. Monday - Friday. Please note that voicemails left after 4:00 p.m. may not be returned until the following business day.  We are closed weekends and major holidays. You have access to a nurse at all times for urgent questions. Please call the main number to the clinic Dept: [phone] and follow the prompts.   For any non-urgent questions, you may also contact your provider using MyChart. We now offer e-Visits for anyone 88 and older to request care online for non-urgent symptoms. For details visit [email or web address].   Also download the MyChart app! Go to the app store, search "MyChart", open the app, select [facility], and log in with your MyChart username and password.  Masks are optional in the cancer centers. If you would like for your care team to wear a mask while they are taking care of you, please let them know. You may have one support Wandra Babin who is at least 49 years old accompany you for your appointments.

## 2022-04-18 ENCOUNTER — Other Ambulatory Visit: Payer: Self-pay | Admitting: Adult Health

## 2022-04-18 DIAGNOSIS — R000 Tachycardia, unspecified: Secondary | ICD-10-CM

## 2022-04-20 ENCOUNTER — Other Ambulatory Visit: Payer: Self-pay

## 2022-05-07 ENCOUNTER — Ambulatory Visit: Payer: Federal, State, Local not specified - PPO | Admitting: Adult Health

## 2022-05-07 ENCOUNTER — Other Ambulatory Visit: Payer: Federal, State, Local not specified - PPO

## 2022-05-07 ENCOUNTER — Ambulatory Visit: Payer: Federal, State, Local not specified - PPO

## 2022-05-07 ENCOUNTER — Inpatient Hospital Stay: Payer: Federal, State, Local not specified - PPO

## 2022-05-07 ENCOUNTER — Inpatient Hospital Stay (HOSPITAL_BASED_OUTPATIENT_CLINIC_OR_DEPARTMENT_OTHER): Payer: Federal, State, Local not specified - PPO | Admitting: Adult Health

## 2022-05-07 ENCOUNTER — Encounter: Payer: Self-pay | Admitting: Adult Health

## 2022-05-07 ENCOUNTER — Other Ambulatory Visit: Payer: Self-pay

## 2022-05-07 VITALS — BP 120/87 | HR 86 | Resp 18

## 2022-05-07 DIAGNOSIS — C50211 Malignant neoplasm of upper-inner quadrant of right female breast: Secondary | ICD-10-CM

## 2022-05-07 DIAGNOSIS — Z9221 Personal history of antineoplastic chemotherapy: Secondary | ICD-10-CM | POA: Diagnosis not present

## 2022-05-07 DIAGNOSIS — Z170 Estrogen receptor positive status [ER+]: Secondary | ICD-10-CM | POA: Diagnosis not present

## 2022-05-07 DIAGNOSIS — Z5112 Encounter for antineoplastic immunotherapy: Secondary | ICD-10-CM | POA: Diagnosis not present

## 2022-05-07 DIAGNOSIS — C7951 Secondary malignant neoplasm of bone: Secondary | ICD-10-CM | POA: Diagnosis not present

## 2022-05-07 DIAGNOSIS — I10 Essential (primary) hypertension: Secondary | ICD-10-CM | POA: Diagnosis not present

## 2022-05-07 DIAGNOSIS — Z923 Personal history of irradiation: Secondary | ICD-10-CM | POA: Diagnosis not present

## 2022-05-07 DIAGNOSIS — Z803 Family history of malignant neoplasm of breast: Secondary | ICD-10-CM | POA: Diagnosis not present

## 2022-05-07 DIAGNOSIS — Z79899 Other long term (current) drug therapy: Secondary | ICD-10-CM | POA: Diagnosis not present

## 2022-05-07 DIAGNOSIS — Z9013 Acquired absence of bilateral breasts and nipples: Secondary | ICD-10-CM | POA: Diagnosis not present

## 2022-05-07 DIAGNOSIS — Z800 Family history of malignant neoplasm of digestive organs: Secondary | ICD-10-CM | POA: Diagnosis not present

## 2022-05-07 DIAGNOSIS — C50919 Malignant neoplasm of unspecified site of unspecified female breast: Secondary | ICD-10-CM

## 2022-05-07 LAB — CBC WITH DIFFERENTIAL/PLATELET
Abs Immature Granulocytes: 0.01 10*3/uL (ref 0.00–0.07)
Basophils Absolute: 0 10*3/uL (ref 0.0–0.1)
Basophils Relative: 1 %
Eosinophils Absolute: 0.2 10*3/uL (ref 0.0–0.5)
Eosinophils Relative: 4 %
HCT: 38.9 % (ref 36.0–46.0)
Hemoglobin: 13.3 g/dL (ref 12.0–15.0)
Immature Granulocytes: 0 %
Lymphocytes Relative: 31 %
Lymphs Abs: 1.4 10*3/uL (ref 0.7–4.0)
MCH: 27.9 pg (ref 26.0–34.0)
MCHC: 34.2 g/dL (ref 30.0–36.0)
MCV: 81.7 fL (ref 80.0–100.0)
Monocytes Absolute: 0.4 10*3/uL (ref 0.1–1.0)
Monocytes Relative: 8 %
Neutro Abs: 2.4 10*3/uL (ref 1.7–7.7)
Neutrophils Relative %: 56 %
Platelets: 186 10*3/uL (ref 150–400)
RBC: 4.76 MIL/uL (ref 3.87–5.11)
RDW: 14.1 % (ref 11.5–15.5)
WBC: 4.3 10*3/uL (ref 4.0–10.5)
nRBC: 0 % (ref 0.0–0.2)

## 2022-05-07 LAB — COMPREHENSIVE METABOLIC PANEL
ALT: 9 U/L (ref 0–44)
AST: 14 U/L — ABNORMAL LOW (ref 15–41)
Albumin: 4.2 g/dL (ref 3.5–5.0)
Alkaline Phosphatase: 63 U/L (ref 38–126)
Anion gap: 4 — ABNORMAL LOW (ref 5–15)
BUN: 13 mg/dL (ref 6–20)
CO2: 28 mmol/L (ref 22–32)
Calcium: 9.2 mg/dL (ref 8.9–10.3)
Chloride: 112 mmol/L — ABNORMAL HIGH (ref 98–111)
Creatinine, Ser: 0.74 mg/dL (ref 0.44–1.00)
GFR, Estimated: >60 mL/min (ref >60)
Glucose, Bld: 84 mg/dL (ref 70–99)
Potassium: 3.3 mmol/L — ABNORMAL LOW (ref 3.5–5.1)
Sodium: 144 mmol/L (ref 135–145)
Total Bilirubin: 0.5 mg/dL (ref 0.3–1.2)
Total Protein: 6.5 g/dL (ref 6.5–8.1)

## 2022-05-07 MED ORDER — SODIUM CHLORIDE 0.9% FLUSH
10.0000 mL | INTRAVENOUS | Status: DC | PRN
  Administered 2022-05-07: 10 mL

## 2022-05-07 MED ORDER — SODIUM CHLORIDE 0.9 % IV SOLN: Freq: Once | INTRAVENOUS | Status: AC

## 2022-05-07 MED ORDER — ACETAMINOPHEN 325 MG PO TABS
650.0000 mg | ORAL_TABLET | Freq: Once | ORAL | Status: AC
  Administered 2022-05-07: 650 mg via ORAL
  Filled 2022-05-07: qty 2

## 2022-05-07 MED ORDER — SODIUM CHLORIDE 0.9 % IV SOLN
420.0000 mg | Freq: Once | INTRAVENOUS | Status: AC
  Administered 2022-05-07: 420 mg via INTRAVENOUS
  Filled 2022-05-07: qty 14

## 2022-05-07 MED ORDER — TRASTUZUMAB-DKST CHEMO 150 MG IV SOLR
6.0000 mg/kg | Freq: Once | INTRAVENOUS | Status: AC
  Administered 2022-05-07: 441 mg via INTRAVENOUS
  Filled 2022-05-07: qty 21

## 2022-05-07 MED ORDER — HEPARIN SOD (PORK) LOCK FLUSH 100 UNIT/ML IV SOLN
500.0000 [IU] | Freq: Once | INTRAVENOUS | Status: AC | PRN
  Administered 2022-05-07: 500 [IU]

## 2022-05-07 MED ORDER — SODIUM CHLORIDE 0.9% FLUSH
10.0000 mL | Freq: Once | INTRAVENOUS | Status: AC
  Administered 2022-05-07: 10 mL

## 2022-05-07 MED ORDER — DIPHENHYDRAMINE HCL 25 MG PO CAPS
25.0000 mg | ORAL_CAPSULE | Freq: Once | ORAL | Status: AC
  Administered 2022-05-07: 25 mg via ORAL
  Filled 2022-05-07: qty 1

## 2022-05-07 NOTE — Progress Notes (Signed)
Pt declined to stay for post 30 obs, VSS, ambulatory to lobby upon discharge

## 2022-05-07 NOTE — Progress Notes (Signed)
[facility] Follow up:    Sherry Congress, NP [place] [address]   DIAGNOSIS:  Cancer Staging  Malignant neoplasm of upper-inner quadrant of right breast in female, estrogen receptor positive (Sherry Barry) Staging form: Breast, AJCC 7th Edition - Clinical: Stage IA (T1c, N0, cM0) - Unsigned Specimen type: Core Needle Biopsy Histopathologic type: 9931 Laterality: Right Staging comments: Staged at breast conference 1.15.14  - Pathologic stage from 07/14/2012: Stage IV ([place], NX, M1) - Signed by [name], MD on 08/28/2021 Specimen type: Core Needle Biopsy Histopathologic type: 9931 Laterality: Right   SUMMARY OF ONCOLOGIC HISTORY: Oncology History  Malignant neoplasm of upper-inner quadrant of right breast in female, estrogen receptor positive (Sherry Barry)  07/13/2012 Clinical Stage   Stage IA: T1c N0   07/14/2012 Definitive Surgery   Bilateral mastectomy/right SLNB: RIGHT invasive ductal carcinoma, grade 3, ER+, PR +, Her2/neu positive (ratio 3.02), Ki67 48%. DCIS. 1/4 LN positive for malignancy. LEFT: benign   07/14/2012 Pathologic Stage   Stage IIB: T2 N1a M0   07/14/2012 Cancer Staging   Staging form: Breast, AJCC 7th Edition - Pathologic stage from 07/14/2012: Stage IV ([place], NX, M1) - Signed by [name], MD on 08/28/2021 Specimen type: Core Needle Biopsy Histopathologic type: 9931 Laterality: Right   08/17/2012 - 08/30/2013 Chemotherapy   Adjuvant carboplatin, docetaxel, and trastuzumab x 6 cycles (completed 11/30/2012) followed by maintenance trastuzumab to total one year   11/2012 Procedure   Comp Cancer Gene panel (Gene[facility]) negative for deleterious mutations    - 02/2013 Radiation Therapy   Adjuvant RT to right breast   02/2013 -  Anti-estrogen oral therapy   Tamoxifen 20 mg daily   09/24/2013 Surgery   Bilateral breast reconstruction with latissmus flap and expander placement   12/12/2013 Surgery   Implant placement     Relapse/Recurrence   Left sided malignant pleural effusion.  Tumor cells are positive for GATA3 and ER, negative for TTF-1 consistent with recurrent breast carcinoma Prognostic showed ER 90% positive strong staining, PR 80% positive strong staining, HER2 negative.    09/16/2021 Imaging   PET scan showed malignant left pleural effusion with extensive areas of nodularity and hypermetabolic activity involving the mediastinal border and pericardium as well as the most inferior aspects of the costodiaphragmatic recess.  Signs of nodal disease at the thoracic inlet on the left suspect extension of disease below the diaphragm along the left anterolateral aorta.  Nonspecific moderate to markedly increased metabolic activity about the base of the tongue bilaterally.  Consider direct visualization showing mildly asymmetric uptake favoring the right lingual tonsil.  Sclerotic foci without marked increased metabolic activity suspicious for metastatic disease perhaps treated or questions.  Heterogeneous marrow uptake seen elsewhere generalized and nonspecific.  Consider spinal MRI is warranted  MRI brain without any evidence of intracranial metastatic disease.   10/09/2021 - 02/11/2022 Chemotherapy   Taxotere, Herceptin, Perjeta x 6   12/04/2021 Imaging   There is no evidence new metastatic disease. There is interval decrease in the left pleural effusion possibly suggesting resolving pleural metastatic disease. Few scattered sclerotic metastatic lesions in the skeletal structures have not changed significantly.   No acute findings are seen in the chest abdomen and pelvis.     02/22/2022 -  Antibody Plan   Herceptin/Perjeta every 3 weeks   03/21/2022 Imaging   CT chest abdomen pelvis  IMPRESSION: 1. No significant change since previous study. Again demonstrated is a chronic small left pleural effusion with basilar  atelectasis and scattered sclerotic skeletal metastases. 2. No acute abnormalities are  demonstrated. 3. Small esophageal hiatal hernia.     Electronically Signed   By: Lucienne Capers M.D.   On: 03/21/2022 20:23     CURRENT THERAPY: Herceptin Perjeta; Zoladex every 3 months; Tamoxifen  INTERVAL HISTORY: Sherry Barry 51 y.o. female returns for follow-up of her metastatic breast cancer on Herceptin Perjeta alone along with Zoladex given every 3 months-most recently 03/11/2022.  Her most recent CT chest abdomen pelvis occurred on March 21, 2022 and showed no additional metastatic disease she had a chronic small left pleural effusion and scattered sclerotic skeletal metastasis.  There was no progression of cancer noted.  Her most recent echocardiogram occurred on March 15, 2022 which demonstrated a left ventricular ejection fraction of 60 to 65% and a global longitudinal strain of -20.4% which was normal.   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  MEDICAL HISTORY: Past Medical History:  Diagnosis Date   Allergy    Breast cancer ([facility])    Breast cancer ([facility])    right   History of chemotherapy    doxetaxel/carboplatin/trastuzumab   History of migraine    last one about a week ago   Hx of radiation therapy 01/01/13- 02/15/13   r chest wall, r supraclav/axilla 5040 cGy/28 sessions, scar boost  1000 cGy/5 sessions   Hypertension    no meds,   urgent care on pomana   Migraine    Migraine     SURGICAL HISTORY: Past Surgical History:  Procedure Laterality Date   ABDOMINAL HYSTERECTOMY     no salpingo-oophorectomy 2009   BREAST RECONSTRUCTION WITH PLACEMENT OF TISSUE EXPANDER AND FLEX HD (ACELLULAR HYDRATED DERMIS) Left 09/24/2013   IR IMAGING GUIDED PORT INSERTION  10/07/2021   IR THORACENTESIS ASP PLEURAL SPACE W/IMG GUIDE  08/11/2021   IR THORACENTESIS ASP PLEURAL SPACE W/IMG GUIDE  10/07/2021   LATISSIMUS FLAP TO BREAST Right 09/24/2013   Procedure: RIGHT LATISSMUS MYOCUTAEIOUS MUSCLE FLAP AND PLACEMENT OF TISSUE [name];  Surgeon: Theodoro Kos, DO;  Location: [facility];  Service: Plastics;  Laterality: Right;   LIPOSUCTION WITH LIPOFILLING Bilateral 12/12/2013   Procedure: LIPOSUCTION WITH LIPOFILLING;  Surgeon: Theodoro Kos, DO;  Location: [facility];  Service: Plastics;  Laterality: Bilateral;   PORT-A-CATH REMOVAL Left 12/12/2013   Procedure: REMOVAL PORT-A-CATH;  Surgeon: Theodoro Kos, DO;  Location: [facility];  Service: Plastics;  Laterality: Left;   PORTACATH PLACEMENT  07/14/2012   Procedure: INSERTION PORT-A-CATH;  Surgeon: Joyice Faster. Cornett, MD;  Location: [facility];  Service: General;  Laterality: Left;   RECONSTRUCTION BREAST W/ LATISSIMUS DORSI FLAP Right 09/24/2013   "& tissue expander placement"   REMOVAL OF BILATERAL TISSUE EXPANDERS WITH PLACEMENT OF BILATERAL BREAST IMPLANTS Bilateral 12/12/2013   Procedure: REMOVAL OF BILATERAL TISSUE EXPANDERS WITH PLACEMENT OF BILATERAL BREAST IMPLANTS/BILATERAL CAPSULECTOMIES WITH  LIPOFILLING  FAT GRAFTING;  Surgeon: Theodoro Kos, DO;  Location: [facility];  Service: Plastics;  Laterality: Bilateral;   SIMPLE MASTECTOMY WITH AXILLARY SENTINEL NODE BIOPSY  07/14/2012   Procedure: SIMPLE MASTECTOMY WITH AXILLARY SENTINEL NODE BIOPSY;  Surgeon: Joyice Faster. Cornett, MD;  Location: [facility];  Service:  General;  Laterality: Right;  Bilateral simple mastectomy with port and right sebtibel lymph node mapping   SIMPLE MASTECTOMY WITH AXILLARY SENTINEL NODE BIOPSY  07/14/2012   Procedure: SIMPLE MASTECTOMY;  Surgeon: Joyice Faster. Cornett, MD;  Location: [facility];  Service: General;  Laterality: Left;   TISSUE EXPANDER PLACEMENT Left 09/24/2013   Procedure: PLACEMENT OF TISSUE EXPANDER AND FLEX HD TO LEFT BREAST;  Surgeon: Theodoro Kos, DO;  Location: [facility];  Service: Plastics;  Laterality: Left;    SOCIAL HISTORY: Social History   Socioeconomic History   Marital status: Married    Spouse name: Not on file   Number of children: 2   Years of education: Not on file   Highest education level: Not on file  Occupational History    Employer: [employer]  Tobacco Use   Smoking status: Never   Smokeless tobacco: Never  Substance and Sexual Activity   Alcohol use: Yes    Comment: occasional   Drug use: No   Sexual activity: Yes    Birth control/protection: Surgical  Other Topics Concern   Not on file  Social History Narrative   Not on file   Social Determinants of Health   Financial Resource Strain: Not on file  Food Insecurity: Not on file  Transportation Needs: Not on file  Physical Activity: Not on file  Stress: Not on file  Social Connections: Not on file  Intimate Partner Violence: Not on file    FAMILY HISTORY: Family History  Problem Relation Age of Onset   Hypertension Mother    Alcohol abuse Mother    Heart disease Maternal Grandmother    Stroke Maternal Grandfather    Colon cancer Maternal Aunt 50       alive at 22   Brain cancer Maternal Uncle 38       and lymphoma in early 22s; deceased   Brain cancer Maternal Uncle 60       deceased   Pancreatic cancer Maternal Uncle 2       alive at 10   Breast cancer Cousin 10       mat 1st cousin once removed through mat GF ; deceased   Breast cancer Maternal Aunt        great aunt through mat GF; dx at ? age    Review  of Systems  Constitutional:  Positive for fatigue. Negative for appetite change, chills, fever and unexpected weight change.  HENT:   Negative for hearing loss, lump/mass and trouble swallowing.   Eyes:  Negative for eye problems and icterus.  Respiratory:  Negative for chest tightness, cough and shortness of breath.   Cardiovascular:  Negative for chest pain, leg swelling and palpitations.  Gastrointestinal:  Negative for abdominal distention, abdominal pain, constipation, diarrhea, nausea and vomiting.  Endocrine: Negative for hot flashes.  Genitourinary:  Negative for difficulty urinating.   Musculoskeletal:  Negative for arthralgias.  Skin:  Negative for itching and rash.  Neurological:  Negative for dizziness, extremity weakness, headaches and numbness.  Hematological:  Negative for adenopathy. Does not bruise/bleed easily.  Psychiatric/Behavioral:  Negative for depression. The patient is not nervous/anxious.       PHYSICAL  EXAMINATION  ECOG PERFORMANCE STATUS: 1 - Symptomatic but completely ambulatory  Vitals:   05/07/22 0916  BP: (!) 134/97  Pulse: 95  Resp: 17  Temp: (!) 97.2 F (36.2 C)  SpO2: 100%    Physical Exam Constitutional:      General: She is not in acute distress.    Appearance: Normal appearance. She is not toxic-appearing.  HENT:     Head: Normocephalic and atraumatic.  Eyes:     General: No scleral icterus. Cardiovascular:     Rate and Rhythm: Normal rate and regular rhythm.     Pulses: Normal pulses.     Heart sounds: Normal heart sounds.  Pulmonary:     Effort: Pulmonary effort is normal.     Breath sounds: Normal breath sounds.  Abdominal:     General: Abdomen is flat. Bowel sounds are normal. There is no distension.     Palpations: Abdomen is soft.     Tenderness: There is no abdominal tenderness.  Musculoskeletal:        General: No swelling.     Cervical back: Neck supple.  Lymphadenopathy:     Cervical: No cervical adenopathy.   Skin:    General: Skin is warm and dry.     Findings: No rash.  Neurological:     General: No focal deficit present.     Mental Status: She is alert.  Psychiatric:        Mood and Affect: Mood normal.        Behavior: Behavior normal.     LABORATORY DATA:  CBC    Component Value Date/Time   WBC 4.3 05/07/2022 0852   RBC 4.76 05/07/2022 0852   HGB 13.3 05/07/2022 0852   HGB 13.4 03/26/2022 1329   HGB 15.9 10/30/2019 1030   HGB 14.7 02/15/2017 0852   HCT 38.9 05/07/2022 0852   HCT 46.1 10/30/2019 1030   HCT 42.9 02/15/2017 0852   PLT 186 05/07/2022 0852   PLT 261 03/26/2022 1329   PLT 245 10/30/2019 1030   MCV 81.7 05/07/2022 0852   MCV 88 10/30/2019 1030   MCV 86.7 02/15/2017 0852   MCH 27.9 05/07/2022 0852   MCHC 34.2 05/07/2022 0852   RDW 14.1 05/07/2022 0852   RDW 12.5 10/30/2019 1030   RDW 12.6 02/15/2017 0852   LYMPHSABS 1.4 05/07/2022 0852   LYMPHSABS 1.4 02/15/2017 0852   MONOABS 0.4 05/07/2022 0852   MONOABS 0.3 02/15/2017 0852   EOSABS 0.2 05/07/2022 0852   EOSABS 0.1 02/15/2017 0852   BASOSABS 0.0 05/07/2022 0852   BASOSABS 0.0 02/15/2017 0852    CMP     Component Value Date/Time   NA 144 05/07/2022 0852   NA 140 10/30/2019 1030   NA 141 02/15/2017 0852   K 3.3 (L) 05/07/2022 0852   K 3.9 02/15/2017 0852   CL 112 (H) 05/07/2022 0852   CL 101 11/15/2012 0904   CO2 28 05/07/2022 0852   CO2 25 02/15/2017 0852   GLUCOSE 84 05/07/2022 0852   GLUCOSE 87 02/15/2017 0852   GLUCOSE 69 (L) 11/15/2012 0904   BUN 13 05/07/2022 0852   BUN 11 10/30/2019 1030   BUN 12.5 02/15/2017 0852   CREATININE 0.74 05/07/2022 0852   CREATININE 0.66 03/26/2022 1329   CREATININE 0.9 02/15/2017 0852   CALCIUM 9.2 05/07/2022 0852   CALCIUM 9.3 02/15/2017 0852   PROT 6.5 05/07/2022 0852   PROT 6.7 10/30/2019 1030   PROT 7.0 02/15/2017 0852   ALBUMIN  4.2 05/07/2022 0852   ALBUMIN 4.2 10/30/2019 1030   ALBUMIN 3.6 02/15/2017 0852   AST 14 (L) 05/07/2022 0852    AST 15 03/26/2022 1329   AST 16 02/15/2017 0852   ALT 9 05/07/2022 0852   ALT 9 03/26/2022 1329   ALT 13 02/15/2017 0852   ALKPHOS 63 05/07/2022 0852   ALKPHOS 63 02/15/2017 0852   BILITOT 0.5 05/07/2022 0852   BILITOT 0.4 03/26/2022 1329   BILITOT 0.69 02/15/2017 0852   GFRNONAA >60 05/07/2022 0852   GFRNONAA >60 03/26/2022 1329   GFRAA 100 10/30/2019 1030       ASSESSMENT and THERAPY PLAN:   Malignant neoplasm of upper-inner quadrant of right breast in female, estrogen receptor positive (HCC) Essance is a 51 year old woman with metastatic breast cancer currently on treatment with Herceptin Perjeta given every 3 weeks, tamoxifen oral daily, Zometa every 12 weeks and Zoladex every 12 weeks.  Juliane will continue on Herceptin and Perjeta given every 3 weeks she is tolerating this well and has no clinical or radiographic sign of progression of her metastatic breast cancer.  She will also continue on her tamoxifen daily if she is tolerating this well.  She will be due for Zoladex and Zometa around December 15 when she returns for her next Herceptin and Perjeta and I put in the appointment notes for her to receive these on this day.  I also reviewed this with her in detail and she tolerates both of these moderately well.    We discussed her mild fatigue and she is going to [place] [facility] and will begin exercising.  We did discuss energy conservation and to start with low weights and to increase slowly.  Vali will return in 3 weeks for her next infusion, Zoladex, and Zometa.  She has an appointment with [place] in January prior to her treatment due that day as well.   All questions were answered. The patient knows to call the clinic with any problems, questions or concerns. We can certainly see the patient much sooner if necessary.  Total encounter time:30 minutes*in face-to-face visit time, chart review, lab review, care coordination, order entry, and documentation of the encounter  time.    Wilber Bihari, NP 05/07/22 9:52 AM Medical Oncology and Hematology [facility] [place], [place] [address] Tel. [phone]    Fax. [phone]  *Total Encounter Time as defined by the Centers for Medicare and Medicaid Services includes, in addition to the face-to-face time of a patient visit (documented in the note above) non-face-to-face time: obtaining and reviewing outside history, ordering and reviewing medications, tests or procedures, care coordination (communications with other health care professionals or caregivers) and documentation in the medical record.

## 2022-05-07 NOTE — Assessment & Plan Note (Signed)
Sherry Barry is a 51 year old woman with metastatic breast cancer currently on treatment with Herceptin Perjeta given every 3 weeks, tamoxifen oral daily, Zometa every 12 weeks and Zoladex every 12 weeks.  Sherry Barry will continue on Herceptin and Perjeta given every 3 weeks she is tolerating this well and has no clinical or radiographic sign of progression of her metastatic breast cancer.  She will also continue on her tamoxifen daily if she is tolerating this well.  She will be due for Zoladex and Zometa around December 15 when she returns for her next Herceptin and Perjeta and I put in the appointment notes for her to receive these on this day.  I also reviewed this with her in detail and she tolerates both of these moderately well.    We discussed her mild fatigue and she is going to [place] [facility] and will begin exercising.  We did discuss energy conservation and to start with low weights and to increase slowly.  Sherry Barry will return in 3 weeks for her next infusion, Zoladex, and Zometa.  She has an appointment with [place] in January prior to her treatment due that day as well.

## 2022-05-07 NOTE — Patient Instructions (Signed)
[facility]  Discharge Instructions: Thank you for choosing [facility] to provide your oncology and hematology care.   If you have a lab appointment with the [facility], please go directly to the [facility] and check in at the registration area.   Wear comfortable clothing and clothing appropriate for easy access to any Portacath or PICC line.   We strive to give you quality time with your provider. You may need to reschedule your appointment if you arrive late (15 or more minutes).  Arriving late affects you and other patients whose appointments are after yours.  Also, if you miss three or more appointments without notifying the office, you may be dismissed from the clinic at the provider's discretion.      For prescription refill requests, have your pharmacy contact our office and allow 72 hours for refills to be completed.    Today you received the following chemotherapy and/or immunotherapy agents: Pertuzumab & Trastuzumab      To help prevent nausea and vomiting after your treatment, we encourage you to take your nausea medication as directed.  BELOW ARE SYMPTOMS THAT SHOULD BE REPORTED IMMEDIATELY: *FEVER GREATER THAN 100.4 F (38 C) OR HIGHER *CHILLS OR SWEATING *NAUSEA AND VOMITING THAT IS NOT CONTROLLED WITH YOUR NAUSEA MEDICATION *UNUSUAL SHORTNESS OF BREATH *UNUSUAL BRUISING OR BLEEDING *URINARY PROBLEMS (pain or burning when urinating, or frequent urination) *BOWEL PROBLEMS (unusual diarrhea, constipation, pain near the anus) TENDERNESS IN MOUTH AND THROAT WITH OR WITHOUT PRESENCE OF ULCERS (sore throat, sores in mouth, or a toothache) UNUSUAL RASH, SWELLING OR PAIN  UNUSUAL VAGINAL DISCHARGE OR ITCHING   Items with * indicate a potential emergency and should be followed up as soon as possible or go to the Emergency Department if any problems should occur.  Please show the CHEMOTHERAPY ALERT CARD or IMMUNOTHERAPY ALERT CARD  at check-in to the Emergency Department and triage nurse.  Should you have questions after your visit or need to cancel or reschedule your appointment, please contact [facility]  Dept: [phone]  and follow the prompts.  Office hours are 8:00 a.m. to 4:30 p.m. Monday - Friday. Please note that voicemails left after 4:00 p.m. may not be returned until the following business day.  We are closed weekends and major holidays. You have access to a nurse at all times for urgent questions. Please call the main number to the clinic Dept: [phone] and follow the prompts.   For any non-urgent questions, you may also contact your provider using MyChart. We now offer e-Visits for anyone 56 and older to request care online for non-urgent symptoms. For details visit [email or web address].   Also download the MyChart app! Go to the app store, search "MyChart", open the app, select , and log in with your MyChart username and password.  Masks are optional in the cancer centers. If you would like for your care team to wear a mask while they are taking care of you, please let them know. You may have one support person who is at least 49 years old accompany you for your appointments.

## 2022-05-10 ENCOUNTER — Telehealth: Payer: Self-pay | Admitting: Adult Health

## 2022-05-10 NOTE — Telephone Encounter (Signed)
Cancelled appointments per 11/24 los. Patient is aware.

## 2022-05-11 ENCOUNTER — Telehealth: Payer: Self-pay

## 2022-05-11 NOTE — Telephone Encounter (Signed)
-----   Message from Gardenia Phlegm, NP sent at 05/07/2022  3:35 PM EST ----- Please recommend patient increased potassium intake ----- Message ----- From: Buel Ream, Lab In [place] Sent: 05/07/2022   9:16 AM EST To: [name], MD

## 2022-05-11 NOTE — Telephone Encounter (Signed)
Per NP's request, patient was made aware of her blood work results and plan. Patient verbalized understanding.

## 2022-05-28 ENCOUNTER — Inpatient Hospital Stay: Payer: Federal, State, Local not specified - PPO | Attending: Hematology and Oncology

## 2022-05-28 ENCOUNTER — Inpatient Hospital Stay: Payer: Federal, State, Local not specified - PPO

## 2022-05-28 VITALS — BP 120/92 | HR 98 | Temp 98.3°F | Resp 16 | Ht 62.0 in | Wt 145.0 lb

## 2022-05-28 DIAGNOSIS — C50919 Malignant neoplasm of unspecified site of unspecified female breast: Secondary | ICD-10-CM

## 2022-05-28 DIAGNOSIS — Z923 Personal history of irradiation: Secondary | ICD-10-CM | POA: Diagnosis not present

## 2022-05-28 DIAGNOSIS — C50211 Malignant neoplasm of upper-inner quadrant of right female breast: Secondary | ICD-10-CM

## 2022-05-28 DIAGNOSIS — Z170 Estrogen receptor positive status [ER+]: Secondary | ICD-10-CM | POA: Insufficient documentation

## 2022-05-28 DIAGNOSIS — Z5112 Encounter for antineoplastic immunotherapy: Secondary | ICD-10-CM | POA: Insufficient documentation

## 2022-05-28 DIAGNOSIS — Z9013 Acquired absence of bilateral breasts and nipples: Secondary | ICD-10-CM | POA: Insufficient documentation

## 2022-05-28 DIAGNOSIS — Z79810 Long term (current) use of selective estrogen receptor modulators (SERMs): Secondary | ICD-10-CM | POA: Diagnosis not present

## 2022-05-28 LAB — COMPREHENSIVE METABOLIC PANEL
ALT: 12 U/L (ref 0–44)
AST: 16 U/L (ref 15–41)
Albumin: 4.2 g/dL (ref 3.5–5.0)
Alkaline Phosphatase: 64 U/L (ref 38–126)
Anion gap: 5 (ref 5–15)
BUN: 10 mg/dL (ref 6–20)
CO2: 28 mmol/L (ref 22–32)
Calcium: 9.7 mg/dL (ref 8.9–10.3)
Chloride: 109 mmol/L (ref 98–111)
Creatinine, Ser: 0.7 mg/dL (ref 0.44–1.00)
GFR, Estimated: >60 mL/min (ref >60)
Glucose, Bld: 95 mg/dL (ref 70–99)
Potassium: 3.6 mmol/L (ref 3.5–5.1)
Sodium: 142 mmol/L (ref 135–145)
Total Bilirubin: 0.4 mg/dL (ref 0.3–1.2)
Total Protein: 6.5 g/dL (ref 6.5–8.1)

## 2022-05-28 LAB — CBC WITH DIFFERENTIAL/PLATELET
Abs Immature Granulocytes: 0.01 10*3/uL (ref 0.00–0.07)
Basophils Absolute: 0 10*3/uL (ref 0.0–0.1)
Basophils Relative: 1 %
Eosinophils Absolute: 0.1 10*3/uL (ref 0.0–0.5)
Eosinophils Relative: 2 %
HCT: 41.6 % (ref 36.0–46.0)
Hemoglobin: 14.4 g/dL (ref 12.0–15.0)
Immature Granulocytes: 0 %
Lymphocytes Relative: 32 %
Lymphs Abs: 1.6 10*3/uL (ref 0.7–4.0)
MCH: 27.6 pg (ref 26.0–34.0)
MCHC: 34.6 g/dL (ref 30.0–36.0)
MCV: 79.8 fL — ABNORMAL LOW (ref 80.0–100.0)
Monocytes Absolute: 0.4 10*3/uL (ref 0.1–1.0)
Monocytes Relative: 7 %
Neutro Abs: 2.9 10*3/uL (ref 1.7–7.7)
Neutrophils Relative %: 58 %
Platelets: 211 10*3/uL (ref 150–400)
RBC: 5.21 MIL/uL — ABNORMAL HIGH (ref 3.87–5.11)
RDW: 14.7 % (ref 11.5–15.5)
WBC: 5 10*3/uL (ref 4.0–10.5)
nRBC: 0 % (ref 0.0–0.2)

## 2022-05-28 MED ORDER — SODIUM CHLORIDE 0.9% FLUSH
10.0000 mL | INTRAVENOUS | Status: DC | PRN
  Administered 2022-05-28: 10 mL

## 2022-05-28 MED ORDER — ACETAMINOPHEN 325 MG PO TABS
650.0000 mg | ORAL_TABLET | Freq: Once | ORAL | Status: AC
  Administered 2022-05-28: 650 mg via ORAL
  Filled 2022-05-28: qty 2

## 2022-05-28 MED ORDER — SODIUM CHLORIDE 0.9% FLUSH
10.0000 mL | Freq: Once | INTRAVENOUS | Status: AC
  Administered 2022-05-28: 10 mL

## 2022-05-28 MED ORDER — ZOLEDRONIC ACID 4 MG/100ML IV SOLN
4.0000 mg | Freq: Once | INTRAVENOUS | Status: AC
  Administered 2022-05-28: 4 mg via INTRAVENOUS
  Filled 2022-05-28: qty 100

## 2022-05-28 MED ORDER — GOSERELIN ACETATE 10.8 MG ~~LOC~~ IMPL
10.8000 mg | DRUG_IMPLANT | Freq: Once | SUBCUTANEOUS | Status: AC
  Administered 2022-05-28: 10.8 mg via SUBCUTANEOUS
  Filled 2022-05-28: qty 10.8

## 2022-05-28 MED ORDER — DIPHENHYDRAMINE HCL 25 MG PO CAPS
25.0000 mg | ORAL_CAPSULE | Freq: Once | ORAL | Status: AC
  Administered 2022-05-28: 25 mg via ORAL
  Filled 2022-05-28: qty 1

## 2022-05-28 MED ORDER — SODIUM CHLORIDE 0.9 % IV SOLN: Freq: Once | INTRAVENOUS | Status: AC

## 2022-05-28 MED ORDER — HEPARIN SOD (PORK) LOCK FLUSH 100 UNIT/ML IV SOLN
500.0000 [IU] | Freq: Once | INTRAVENOUS | Status: AC | PRN
  Administered 2022-05-28: 500 [IU]

## 2022-05-28 MED ORDER — TRASTUZUMAB-DKST CHEMO 150 MG IV SOLR
6.0000 mg/kg | Freq: Once | INTRAVENOUS | Status: AC
  Administered 2022-05-28: 399 mg via INTRAVENOUS
  Filled 2022-05-28: qty 19

## 2022-05-28 MED ORDER — SODIUM CHLORIDE 0.9 % IV SOLN
420.0000 mg | Freq: Once | INTRAVENOUS | Status: AC
  Administered 2022-05-28: 420 mg via INTRAVENOUS
  Filled 2022-05-28: qty 14

## 2022-05-28 NOTE — Progress Notes (Signed)
Patient declined to stay for 30 minute observation. VSS and ambulatory at discharge 

## 2022-05-28 NOTE — Patient Instructions (Signed)
[facility]  Discharge Instructions: Thank you for choosing [facility] to provide your oncology and hematology care.   If you have a lab appointment with the [facility], please go directly to the [facility] and check in at the registration area.   Wear comfortable clothing and clothing appropriate for easy access to any Portacath or PICC line.   We strive to give you quality time with your provider. You may need to reschedule your appointment if you arrive late (15 or more minutes).  Arriving late affects you and other patients whose appointments are after yours.  Also, if you miss three or more appointments without notifying the office, you may be dismissed from the clinic at the provider's discretion.      For prescription refill requests, have your pharmacy contact our office and allow 72 hours for refills to be completed.    Today you received the following chemotherapy and/or immunotherapy agents: Pertuzumab & Trastuzumab      To help prevent nausea and vomiting after your treatment, we encourage you to take your nausea medication as directed.  BELOW ARE SYMPTOMS THAT SHOULD BE REPORTED IMMEDIATELY: *FEVER GREATER THAN 100.4 F (38 C) OR HIGHER *CHILLS OR SWEATING *NAUSEA AND VOMITING THAT IS NOT CONTROLLED WITH YOUR NAUSEA MEDICATION *UNUSUAL SHORTNESS OF BREATH *UNUSUAL BRUISING OR BLEEDING *URINARY PROBLEMS (pain or burning when urinating, or frequent urination) *BOWEL PROBLEMS (unusual diarrhea, constipation, pain near the anus) TENDERNESS IN MOUTH AND THROAT WITH OR WITHOUT PRESENCE OF ULCERS (sore throat, sores in mouth, or a toothache) UNUSUAL RASH, SWELLING OR PAIN  UNUSUAL VAGINAL DISCHARGE OR ITCHING   Items with * indicate a potential emergency and should be followed up as soon as possible or go to the Emergency Department if any problems should occur.  Please show the CHEMOTHERAPY ALERT CARD or IMMUNOTHERAPY ALERT CARD  at check-in to the Emergency Department and triage nurse.  Should you have questions after your visit or need to cancel or reschedule your appointment, please contact [facility]  Dept: [phone]  and follow the prompts.  Office hours are 8:00 a.m. to 4:30 p.m. Monday - Friday. Please note that voicemails left after 4:00 p.m. may not be returned until the following business day.  We are closed weekends and major holidays. You have access to a nurse at all times for urgent questions. Please call the main number to the clinic Dept: [phone] and follow the prompts.   For any non-urgent questions, you may also contact your provider using MyChart. We now offer e-Visits for anyone 81 and older to request care online for non-urgent symptoms. For details visit [email or web address].   Also download the MyChart app! Go to the app store, search "MyChart", open the app, select [facility], and log in with your MyChart username and password.  Masks are optional in the cancer centers. If you would like for your care team to wear a mask while they are taking care of you, please let them know. You may have one support person who is at least 49 years old accompany you for your appointments.

## 2022-06-14 ENCOUNTER — Other Ambulatory Visit: Payer: Self-pay

## 2022-06-17 ENCOUNTER — Other Ambulatory Visit: Payer: Self-pay

## 2022-06-18 ENCOUNTER — Inpatient Hospital Stay: Payer: Federal, State, Local not specified - PPO

## 2022-06-18 ENCOUNTER — Inpatient Hospital Stay
Payer: Federal, State, Local not specified - PPO | Attending: Hematology and Oncology | Admitting: Hematology and Oncology

## 2022-06-18 ENCOUNTER — Inpatient Hospital Stay (HOSPITAL_BASED_OUTPATIENT_CLINIC_OR_DEPARTMENT_OTHER): Payer: Federal, State, Local not specified - PPO

## 2022-06-18 ENCOUNTER — Encounter: Payer: Self-pay | Admitting: Hematology and Oncology

## 2022-06-18 VITALS — HR 98

## 2022-06-18 DIAGNOSIS — Z79810 Long term (current) use of selective estrogen receptor modulators (SERMs): Secondary | ICD-10-CM | POA: Insufficient documentation

## 2022-06-18 DIAGNOSIS — C50919 Malignant neoplasm of unspecified site of unspecified female breast: Secondary | ICD-10-CM

## 2022-06-18 DIAGNOSIS — Z923 Personal history of irradiation: Secondary | ICD-10-CM | POA: Insufficient documentation

## 2022-06-18 DIAGNOSIS — Z170 Estrogen receptor positive status [ER+]: Secondary | ICD-10-CM

## 2022-06-18 DIAGNOSIS — C50211 Malignant neoplasm of upper-inner quadrant of right female breast: Secondary | ICD-10-CM | POA: Insufficient documentation

## 2022-06-18 DIAGNOSIS — Z803 Family history of malignant neoplasm of breast: Secondary | ICD-10-CM | POA: Diagnosis not present

## 2022-06-18 DIAGNOSIS — Z800 Family history of malignant neoplasm of digestive organs: Secondary | ICD-10-CM | POA: Insufficient documentation

## 2022-06-18 DIAGNOSIS — Z9221 Personal history of antineoplastic chemotherapy: Secondary | ICD-10-CM | POA: Insufficient documentation

## 2022-06-18 DIAGNOSIS — Z90710 Acquired absence of both cervix and uterus: Secondary | ICD-10-CM | POA: Diagnosis not present

## 2022-06-18 DIAGNOSIS — Z5112 Encounter for antineoplastic immunotherapy: Secondary | ICD-10-CM | POA: Insufficient documentation

## 2022-06-18 DIAGNOSIS — C7951 Secondary malignant neoplasm of bone: Secondary | ICD-10-CM | POA: Diagnosis not present

## 2022-06-18 DIAGNOSIS — Z9013 Acquired absence of bilateral breasts and nipples: Secondary | ICD-10-CM | POA: Insufficient documentation

## 2022-06-18 LAB — COMPREHENSIVE METABOLIC PANEL
ALT: 13 U/L (ref 0–44)
AST: 17 U/L (ref 15–41)
Albumin: 4.3 g/dL (ref 3.5–5.0)
Alkaline Phosphatase: 65 U/L (ref 38–126)
Anion gap: 8 (ref 5–15)
BUN: 13 mg/dL (ref 6–20)
CO2: 25 mmol/L (ref 22–32)
Calcium: 8.5 mg/dL — ABNORMAL LOW (ref 8.9–10.3)
Chloride: 108 mmol/L (ref 98–111)
Creatinine, Ser: 0.66 mg/dL (ref 0.44–1.00)
GFR, Estimated: >60 mL/min (ref >60)
Glucose, Bld: 82 mg/dL (ref 70–99)
Potassium: 3.3 mmol/L — ABNORMAL LOW (ref 3.5–5.1)
Sodium: 141 mmol/L (ref 135–145)
Total Bilirubin: 0.4 mg/dL (ref 0.3–1.2)
Total Protein: 6.6 g/dL (ref 6.5–8.1)

## 2022-06-18 LAB — CBC WITH DIFFERENTIAL/PLATELET
Abs Immature Granulocytes: 0.01 10*3/uL (ref 0.00–0.07)
Basophils Absolute: 0 10*3/uL (ref 0.0–0.1)
Basophils Relative: 1 %
Eosinophils Absolute: 0.1 10*3/uL (ref 0.0–0.5)
Eosinophils Relative: 2 %
HCT: 41.4 % (ref 36.0–46.0)
Hemoglobin: 14.6 g/dL (ref 12.0–15.0)
Immature Granulocytes: 0 %
Lymphocytes Relative: 31 %
Lymphs Abs: 2 10*3/uL (ref 0.7–4.0)
MCH: 28 pg (ref 26.0–34.0)
MCHC: 35.3 g/dL (ref 30.0–36.0)
MCV: 79.3 fL — ABNORMAL LOW (ref 80.0–100.0)
Monocytes Absolute: 0.4 10*3/uL (ref 0.1–1.0)
Monocytes Relative: 6 %
Neutro Abs: 3.7 10*3/uL (ref 1.7–7.7)
Neutrophils Relative %: 60 %
Platelets: 242 10*3/uL (ref 150–400)
RBC: 5.22 MIL/uL — ABNORMAL HIGH (ref 3.87–5.11)
RDW: 15.3 % (ref 11.5–15.5)
WBC: 6.2 10*3/uL (ref 4.0–10.5)
nRBC: 0 % (ref 0.0–0.2)

## 2022-06-18 MED ORDER — SODIUM CHLORIDE 0.9 % IV SOLN
420.0000 mg | Freq: Once | INTRAVENOUS | Status: AC
  Administered 2022-06-18: 420 mg via INTRAVENOUS
  Filled 2022-06-18: qty 14

## 2022-06-18 MED ORDER — SODIUM CHLORIDE 0.9% FLUSH
10.0000 mL | INTRAVENOUS | Status: DC | PRN
  Administered 2022-06-18: 10 mL

## 2022-06-18 MED ORDER — DIPHENHYDRAMINE HCL 25 MG PO CAPS
25.0000 mg | ORAL_CAPSULE | Freq: Once | ORAL | Status: AC
  Administered 2022-06-18: 25 mg via ORAL
  Filled 2022-06-18: qty 1

## 2022-06-18 MED ORDER — HEPARIN SOD (PORK) LOCK FLUSH 100 UNIT/ML IV SOLN
500.0000 [IU] | Freq: Once | INTRAVENOUS | Status: AC | PRN
  Administered 2022-06-18: 500 [IU]

## 2022-06-18 MED ORDER — ACETAMINOPHEN 325 MG PO TABS
650.0000 mg | ORAL_TABLET | Freq: Once | ORAL | Status: AC
  Administered 2022-06-18: 650 mg via ORAL
  Filled 2022-06-18: qty 2

## 2022-06-18 MED ORDER — SODIUM CHLORIDE 0.9% FLUSH
10.0000 mL | Freq: Once | INTRAVENOUS | Status: AC
  Administered 2022-06-18: 10 mL

## 2022-06-18 MED ORDER — SODIUM CHLORIDE 0.9 % IV SOLN: Freq: Once | INTRAVENOUS | Status: AC

## 2022-06-18 MED ORDER — TRASTUZUMAB-DKST CHEMO 150 MG IV SOLR
6.0000 mg/kg | Freq: Once | INTRAVENOUS | Status: AC
  Administered 2022-06-18: 399 mg via INTRAVENOUS
  Filled 2022-06-18: qty 19

## 2022-06-18 NOTE — Progress Notes (Signed)
Ok to treat with echo from 03/15/22 per Dr Chryl Heck

## 2022-06-18 NOTE — Patient Instructions (Signed)
[facility]  Discharge Instructions: Thank you for choosing [facility] to provide your oncology and hematology care.   If you have a lab appointment with the [facility], please go directly to the [facility] and check in at the registration area.   Wear comfortable clothing and clothing appropriate for easy access to any Portacath or PICC line.   We strive to give you quality time with your provider. You may need to reschedule your appointment if you arrive late (15 or more minutes).  Arriving late affects you and other patients whose appointments are after yours.  Also, if you miss three or more appointments without notifying the office, you may be dismissed from the clinic at the provider's discretion.      For prescription refill requests, have your pharmacy contact our office and allow 72 hours for refills to be completed.    Today you received the following chemotherapy and/or immunotherapy agents herceptin, perjeta      To help prevent nausea and vomiting after your treatment, we encourage you to take your nausea medication as directed.  BELOW ARE SYMPTOMS THAT SHOULD BE REPORTED IMMEDIATELY: *FEVER GREATER THAN 100.4 F (38 C) OR HIGHER *CHILLS OR SWEATING *NAUSEA AND VOMITING THAT IS NOT CONTROLLED WITH YOUR NAUSEA MEDICATION *UNUSUAL SHORTNESS OF BREATH *UNUSUAL BRUISING OR BLEEDING *URINARY PROBLEMS (pain or burning when urinating, or frequent urination) *BOWEL PROBLEMS (unusual diarrhea, constipation, pain near the anus) TENDERNESS IN MOUTH AND THROAT WITH OR WITHOUT PRESENCE OF ULCERS (sore throat, sores in mouth, or a toothache) UNUSUAL RASH, SWELLING OR PAIN  UNUSUAL VAGINAL DISCHARGE OR ITCHING   Items with * indicate a potential emergency and should be followed up as soon as possible or go to the Emergency Department if any problems should occur.  Please show the CHEMOTHERAPY ALERT CARD or IMMUNOTHERAPY ALERT CARD at  check-in to the Emergency Department and triage nurse.  Should you have questions after your visit or need to cancel or reschedule your appointment, please contact [facility]  Dept: [phone]  and follow the prompts.  Office hours are 8:00 a.m. to 4:30 p.m. Monday - Friday. Please note that voicemails left after 4:00 p.m. may not be returned until the following business day.  We are closed weekends and major holidays. You have access to a nurse at all times for urgent questions. Please call the main number to the clinic Dept: [phone] and follow the prompts.   For any non-urgent questions, you may also contact your provider using MyChart. We now offer e-Visits for anyone 83 and older to request care online for non-urgent symptoms. For details visit [email or web address].   Also download the MyChart app! Go to the app store, search "MyChart", open the app, select [facility], and log in with your MyChart username and password.

## 2022-06-18 NOTE — Progress Notes (Signed)
[facility] Follow up:    Sherry Congress, NP [place] [address]   DIAGNOSIS:  Cancer Staging  Malignant neoplasm of upper-inner quadrant of right breast in female, estrogen receptor positive (Sherry Barry) Staging form: Breast, AJCC 7th Edition - Clinical: Stage IA (T1c, N0, cM0) - Unsigned Specimen type: Core Needle Biopsy Histopathologic type: 9931 Laterality: Right Staging comments: Staged at breast conference 1.15.14  - Pathologic stage from 07/14/2012: Stage IV ([place], NX, M1) - Signed by [name], MD on 08/28/2021 Specimen type: Core Needle Biopsy Histopathologic type: 9931 Laterality: Right   SUMMARY OF ONCOLOGIC HISTORY: Oncology History  Malignant neoplasm of upper-inner quadrant of right breast in female, estrogen receptor positive ([name])  07/13/2012 Clinical Stage   Stage IA: T1c N0   07/14/2012 Definitive Surgery   Bilateral mastectomy/right SLNB: RIGHT invasive ductal carcinoma, grade 3, ER+, PR +, Her2/neu positive (ratio 3.02), Ki67 48%. DCIS. 1/4 LN positive for malignancy. LEFT: benign   07/14/2012 Pathologic Stage   Stage IIB: T2 N1a M0   07/14/2012 Cancer Staging   Staging form: Breast, AJCC 7th Edition - Pathologic stage from 07/14/2012: Stage IV ([place], NX, M1) - Signed by [name], MD on 08/28/2021 Specimen type: Core Needle Biopsy Histopathologic type: 9931 Laterality: Right   08/17/2012 - 08/30/2013 Chemotherapy   Adjuvant carboplatin, docetaxel, and trastuzumab x 6 cycles (completed 11/30/2012) followed by maintenance trastuzumab to total one year   11/2012 Procedure   Comp Cancer Gene panel (Gene[facility]x) negative for deleterious mutations    - 02/2013 Radiation Therapy   Adjuvant RT to right breast   02/2013 -  Anti-estrogen oral therapy   Tamoxifen 20 mg daily   09/24/2013 Surgery   Bilateral breast reconstruction with latissmus flap and expander placement   12/12/2013 Surgery   Implant placement     Relapse/Recurrence   Left sided malignant pleural effusion.  Tumor cells are positive for GATA3 and ER, negative for TTF-1 consistent with recurrent breast carcinoma Prognostic showed ER 90% positive strong staining, PR 80% positive strong staining, HER2 negative.    09/16/2021 Imaging   PET scan showed malignant left pleural effusion with extensive areas of nodularity and hypermetabolic activity involving the mediastinal border and pericardium as well as the most inferior aspects of the costodiaphragmatic recess.  Signs of nodal disease at the thoracic inlet on the left suspect extension of disease below the diaphragm along the left anterolateral aorta.  Nonspecific moderate to markedly increased metabolic activity about the base of the tongue bilaterally.  Consider direct visualization showing mildly asymmetric uptake favoring the right lingual tonsil.  Sclerotic foci without marked increased metabolic activity suspicious for metastatic disease perhaps treated or questions.  Heterogeneous marrow uptake seen elsewhere generalized and nonspecific.  Consider spinal MRI is warranted  MRI brain without any evidence of intracranial metastatic disease.   10/09/2021 - 02/11/2022 Chemotherapy   Taxotere, Herceptin, Perjeta x 6   12/04/2021 Imaging   There is no evidence new metastatic disease. There is interval decrease in the left pleural effusion possibly suggesting resolving pleural metastatic disease. Few scattered sclerotic metastatic lesions in the skeletal structures have not changed significantly.   No acute findings are seen in the chest abdomen and pelvis.     02/22/2022 -  Antibody Plan   Herceptin/Perjeta every 3 weeks   03/21/2022 Imaging   CT chest abdomen pelvis  IMPRESSION: 1. No significant change since previous study. Again demonstrated is a chronic small left pleural effusion with basilar  atelectasis and scattered sclerotic skeletal metastases. 2. No acute abnormalities are  demonstrated. 3. Small esophageal hiatal hernia.     Electronically Signed   By: Lucienne Capers M.D.   On: 03/21/2022 20:23     CURRENT THERAPY: THP  INTERVAL HISTORY:  Sherry Barry 52 y.o. female returns for follow-up Since last visit, she continues to complain of fatigue otherwise no new symptoms.  She is tolerating the combination of Herceptin and pertuzumab well.  No new chest pain, shortness of breath, chest pressure, orthopnea or bilateral lower extremity swelling.  She has not schedule a follow-up with Dr. Aundra Dubin, last follow-up was in October when a 63-monthecho has been recommended and ordered however not scheduled.   She denies any change in bowel habits or urinary habits.  No new neurological complaints. Rest of the pertinent 10 point ROS reviewed and negative.  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  MEDICAL HISTORY: Past Medical History:  Diagnosis Date   Allergy    Breast cancer ([facility]    Breast cancer ([facility]    right   History of chemotherapy    doxetaxel/carboplatin/trastuzumab   History of migraine    last one about a week ago   Hx of radiation therapy 01/01/13- 02/15/13   r chest wall, r supraclav/axilla 5040 cGy/28 sessions, scar boost 1000 cGy/5 sessions   Hypertension    no  meds,   urgent care on pomana   Migraine    Migraine     SURGICAL HISTORY: Past Surgical History:  Procedure Laterality Date   ABDOMINAL HYSTERECTOMY     no salpingo-oophorectomy 2009   BREAST RECONSTRUCTION WITH PLACEMENT OF TISSUE EXPANDER AND FLEX HD (ACELLULAR HYDRATED DERMIS) Left 09/24/2013   IR IMAGING GUIDED PORT INSERTION  10/07/2021   IR THORACENTESIS ASP PLEURAL SPACE W/IMG GUIDE  08/11/2021   IR THORACENTESIS ASP PLEURAL SPACE W/IMG GUIDE  10/07/2021   LATISSIMUS FLAP TO BREAST Right 09/24/2013   Procedure: RIGHT LATISSMUS MYOCUTAEIOUS MUSCLE FLAP AND PLACEMENT OF TISSUE ERiki Sheer  Surgeon: CTheodoro Kos DO;  [facility]  Service: Plastics;  Laterality: Right;   LIPOSUCTION WITH LIPOFILLING Bilateral 12/12/2013   Procedure: LIPOSUCTION WITH LIPOFILLING;  Surgeon: CTheodoro Kos DO;  [facility]  Service: Plastics;  Laterality: Bilateral;   PORT-A-CATH REMOVAL Left 12/12/2013   Procedure: REMOVAL PORT-A-CATH;  Surgeon: CTheodoro Kos DO;  [facility]  Service: Plastics;  Laterality: Left;   PORTACATH PLACEMENT  07/14/2012   Procedure: INSERTION PORT-A-CATH;  Surgeon: TJoyice Faster Cornett, MD;  [facility]  Service: General;  Laterality: Left;   RECONSTRUCTION BREAST W/ LATISSIMUS DORSI FLAP Right 09/24/2013   "& tissue expander placement"   REMOVAL OF BILATERAL TISSUE EXPANDERS WITH PLACEMENT OF BILATERAL BREAST IMPLANTS Bilateral 12/12/2013   Procedure: REMOVAL OF BILATERAL TISSUE EXPANDERS WITH PLACEMENT OF BILATERAL BREAST IMPLANTS/BILATERAL CAPSULECTOMIES WITH  LIPOFILLING FAT  GRAFTING;  Surgeon: Theodoro Kos, DO;  Location: [facility];  Service: Plastics;  Laterality: Bilateral;   SIMPLE MASTECTOMY WITH AXILLARY SENTINEL NODE BIOPSY  07/14/2012   Procedure: SIMPLE MASTECTOMY WITH AXILLARY SENTINEL NODE BIOPSY;  Surgeon: Joyice Faster. Cornett, MD;  Location: [facility];  Service: General;  Laterality: Right;  Bilateral  simple mastectomy with port and right sebtibel lymph node mapping   SIMPLE MASTECTOMY WITH AXILLARY SENTINEL NODE BIOPSY  07/14/2012   Procedure: SIMPLE MASTECTOMY;  Surgeon: Joyice Faster. Cornett, MD;  Location: [facility];  Service: General;  Laterality: Left;   TISSUE EXPANDER PLACEMENT Left 09/24/2013   Procedure: PLACEMENT OF TISSUE EXPANDER AND FLEX HD TO LEFT BREAST;  Surgeon: Theodoro Kos, DO;  Location: [facility];  Service: Plastics;  Laterality: Left;    SOCIAL HISTORY: Social History   Socioeconomic History   Marital status: Married    Spouse name: Not on file   Number of children: 2   Years of education: Not on file   Highest education level: Not on file  Occupational History    Employer: [employer]  Tobacco Use   Smoking status: Never   Smokeless tobacco: Never  Substance and Sexual Activity   Alcohol use: Yes    Comment: occasional   Drug use: No   Sexual activity: Yes    Birth control/protection: Surgical  Other Topics Concern   Not on file  Social History Narrative   Not on file   Social Determinants of Health   Financial Resource Strain: Not on file  Food Insecurity: Not on file  Transportation Needs: Not on file  Physical Activity: Not on file  Stress: Not on file  Social Connections: Not on file  Intimate Partner Violence: Not on file    FAMILY HISTORY: Family History  Problem Relation Age of Onset   Hypertension Mother    Alcohol abuse Mother    Heart disease Maternal Grandmother    Stroke Maternal Grandfather    Colon cancer Maternal Aunt 44       alive at 69   Brain cancer Maternal Uncle 56       and lymphoma in early 56s; deceased   Brain cancer Maternal Uncle 60       deceased   Pancreatic cancer Maternal Uncle 78       alive at 51   Breast cancer Cousin 60       mat 1st cousin once removed through mat GF ; deceased   Breast cancer Maternal Aunt        great aunt through mat GF; dx at ? age    Review of Systems  Constitutional:  Positive  for fatigue. Negative for appetite change, chills, fever and unexpected weight change.  HENT:   Negative for hearing loss, lump/mass and trouble swallowing.   Eyes:  Negative for eye problems and icterus.  Respiratory:  Negative for chest tightness and cough.   Cardiovascular:  Negative for chest pain, leg swelling and palpitations.  Gastrointestinal:  Negative for abdominal distention, abdominal pain, constipation, diarrhea, nausea and vomiting.  Endocrine: Negative for hot flashes.  Genitourinary:  Negative for difficulty urinating.   Musculoskeletal:  Positive for arthralgias and myalgias.  Skin:  Negative for itching and rash.  Neurological:  Negative for dizziness, extremity weakness, headaches and numbness.  Hematological:  Negative for adenopathy. Does not bruise/bleed easily.  Psychiatric/Behavioral:  Negative for depression. The patient is not nervous/anxious.     PHYSICAL EXAMINATION  ECOG PERFORMANCE  STATUS: 1 - Symptomatic but completely ambulatory  Vitals:   06/18/22 1319  BP: (!) 139/92  Pulse: (!) 110  Resp: 18  Temp: 97.7 F (36.5 C)  SpO2: 97%      Physical Exam Constitutional:      General: She is not in acute distress.    Appearance: Normal appearance. She is not toxic-appearing.  HENT:     Head: Normocephalic and atraumatic.  Eyes:     General: No scleral icterus. Cardiovascular:     Rate and Rhythm: Regular rhythm.     Pulses: Normal pulses.     Heart sounds: Normal heart sounds.  Pulmonary:     Effort: Pulmonary effort is normal.     Breath sounds: Normal breath sounds.  Abdominal:     General: Abdomen is flat. Bowel sounds are normal. There is no distension.     Palpations: Abdomen is soft.     Tenderness: There is no abdominal tenderness.  Musculoskeletal:        General: No swelling.     Cervical back: Neck supple.  Lymphadenopathy:     Cervical: No cervical adenopathy.  Skin:    General: Skin is warm and dry.     Findings: No rash.   Neurological:     General: No focal deficit present.     Mental Status: She is alert.  Psychiatric:        Mood and Affect: Mood normal.        Behavior: Behavior normal.     LABORATORY DATA:  CBC    Component Value Date/Time   WBC 6.2 06/18/2022 1259   RBC 5.22 (H) 06/18/2022 1259   HGB 14.6 06/18/2022 1259   HGB 13.4 03/26/2022 1329   HGB 15.9 10/30/2019 1030   HGB 14.7 02/15/2017 0852   HCT 41.4 06/18/2022 1259   HCT 46.1 10/30/2019 1030   HCT 42.9 02/15/2017 0852   PLT 242 06/18/2022 1259   PLT 261 03/26/2022 1329   PLT 245 10/30/2019 1030   MCV 79.3 (L) 06/18/2022 1259   MCV 88 10/30/2019 1030   MCV 86.7 02/15/2017 0852   MCH 28.0 06/18/2022 1259   MCHC 35.3 06/18/2022 1259   RDW 15.3 06/18/2022 1259   RDW 12.5 10/30/2019 1030   RDW 12.6 02/15/2017 0852   LYMPHSABS 2.0 06/18/2022 1259   LYMPHSABS 1.4 02/15/2017 0852   MONOABS 0.4 06/18/2022 1259   MONOABS 0.3 02/15/2017 0852   EOSABS 0.1 06/18/2022 1259   EOSABS 0.1 02/15/2017 0852   BASOSABS 0.0 06/18/2022 1259   BASOSABS 0.0 02/15/2017 0852    CMP     Component Value Date/Time   NA 142 05/28/2022 1249   NA 140 10/30/2019 1030   NA 141 02/15/2017 0852   K 3.6 05/28/2022 1249   K 3.9 02/15/2017 0852   CL 109 05/28/2022 1249   CL 101 11/15/2012 0904   CO2 28 05/28/2022 1249   CO2 25 02/15/2017 0852   GLUCOSE 95 05/28/2022 1249   GLUCOSE 87 02/15/2017 0852   GLUCOSE 69 (L) 11/15/2012 0904   BUN 10 05/28/2022 1249   BUN 11 10/30/2019 1030   BUN 12.5 02/15/2017 0852   CREATININE 0.70 05/28/2022 1249   CREATININE 0.66 03/26/2022 1329   CREATININE 0.9 02/15/2017 0852   CALCIUM 9.7 05/28/2022 1249   CALCIUM 9.3 02/15/2017 0852   PROT 6.5 05/28/2022 1249   PROT 6.7 10/30/2019 1030   PROT 7.0 02/15/2017 0852   ALBUMIN 4.2 05/28/2022 1249  ALBUMIN 4.2 10/30/2019 1030   ALBUMIN 3.6 02/15/2017 0852   AST 16 05/28/2022 1249   AST 15 03/26/2022 1329   AST 16 02/15/2017 0852   ALT 12 05/28/2022  1249   ALT 9 03/26/2022 1329   ALT 13 02/15/2017 0852   ALKPHOS 64 05/28/2022 1249   ALKPHOS 63 02/15/2017 0852   BILITOT 0.4 05/28/2022 1249   BILITOT 0.4 03/26/2022 1329   BILITOT 0.69 02/15/2017 0852   GFRNONAA >60 05/28/2022 1249   GFRNONAA >60 03/26/2022 1329   GFRAA 100 10/30/2019 1030     ASSESSMENT and THERAPY PLAN:  Sherry Barry is a 52 y.o. female who returns for a follow-up for stage IV triple positive breast cancer.  #Stage IV triple positive breast cancer: --Currently treatment includes Herceptin, pertuzumab and tamoxifen -- She is now on Herceptin and pertuzumab maintenance.   -- Most recent echocardiogram with EF of 60 to 65%, normal function, no abnormalities.  She also follows up with Dr. Loralie Champagne from cardio oncology.  I sent an in basket to Dr. Aundra Dubin about follow-up echocardiogram.  However she remains clinically asymptomatic except for chronic sinus tachycardia hence we have made mutual discussion about proceeding with maintenance Herceptin and pertuzumab today.  Patient was in complete agreement with this. --I have also ordered repeat imaging to assess the status of the disease.  Clinically she appears to be continuing to respond to current treatment.   Follow up:  She understands that the treatment is indefinite as long as it works for her or as long as there is no unacceptable toxicity. Continue tamoxifen, Herceptin and pertuzumab maintenance along with ovarian suppression. Zometa given history of bone metastasis, last dose on December 15, next due on March 3 Total time spent: 30 minutes  [name] MD

## 2022-06-25 ENCOUNTER — Ambulatory Visit: Payer: Federal, State, Local not specified - PPO

## 2022-07-05 ENCOUNTER — Ambulatory Visit (HOSPITAL_COMMUNITY)
Admission: RE | Admit: 2022-07-05 | Discharge: 2022-07-05 | Disposition: A | Discharge status: Home / Self Care | Payer: Federal, State, Local not specified - PPO | Source: Ambulatory Visit | Attending: Hematology and Oncology | Admitting: Hematology and Oncology

## 2022-07-05 DIAGNOSIS — J90 Pleural effusion, not elsewhere classified: Secondary | ICD-10-CM | POA: Diagnosis not present

## 2022-07-05 DIAGNOSIS — Z170 Estrogen receptor positive status [ER+]: Secondary | ICD-10-CM | POA: Diagnosis not present

## 2022-07-05 DIAGNOSIS — K7689 Other specified diseases of liver: Secondary | ICD-10-CM | POA: Diagnosis not present

## 2022-07-05 DIAGNOSIS — J701 Chronic and other pulmonary manifestations due to radiation: Secondary | ICD-10-CM | POA: Diagnosis not present

## 2022-07-05 DIAGNOSIS — C7951 Secondary malignant neoplasm of bone: Secondary | ICD-10-CM | POA: Diagnosis not present

## 2022-07-05 DIAGNOSIS — K449 Diaphragmatic hernia without obstruction or gangrene: Secondary | ICD-10-CM | POA: Diagnosis not present

## 2022-07-05 DIAGNOSIS — C50211 Malignant neoplasm of upper-inner quadrant of right female breast: Secondary | ICD-10-CM | POA: Insufficient documentation

## 2022-07-05 DIAGNOSIS — K5730 Diverticulosis of large intestine without perforation or abscess without bleeding: Secondary | ICD-10-CM | POA: Diagnosis not present

## 2022-07-05 MED ORDER — SODIUM CHLORIDE (PF) 0.9 % IJ SOLN
INTRAMUSCULAR | Status: AC
  Filled 2022-07-05: qty 50

## 2022-07-05 MED ORDER — IOHEXOL 300 MG/ML  SOLN
100.0000 mL | Freq: Once | INTRAMUSCULAR | Status: AC | PRN
  Administered 2022-07-05: 100 mL via INTRAVENOUS

## 2022-07-06 ENCOUNTER — Ambulatory Visit (HOSPITAL_BASED_OUTPATIENT_CLINIC_OR_DEPARTMENT_OTHER)
Admission: RE | Admit: 2022-07-06 | Discharge: 2022-07-06 | Disposition: A | Discharge status: Home / Self Care | Payer: Federal, State, Local not specified - PPO | Source: Ambulatory Visit | Attending: Cardiology | Admitting: Cardiology

## 2022-07-06 ENCOUNTER — Ambulatory Visit (HOSPITAL_COMMUNITY)
Admission: RE | Admit: 2022-07-06 | Discharge: 2022-07-06 | Disposition: A | Discharge status: Home / Self Care | Payer: Federal, State, Local not specified - PPO | Source: Ambulatory Visit | Attending: Cardiology | Admitting: Cardiology

## 2022-07-06 ENCOUNTER — Encounter (HOSPITAL_COMMUNITY): Payer: Self-pay | Admitting: Cardiology

## 2022-07-06 VITALS — BP 130/80 | HR 85 | Wt 146.6 lb

## 2022-07-06 DIAGNOSIS — Z170 Estrogen receptor positive status [ER+]: Secondary | ICD-10-CM

## 2022-07-06 DIAGNOSIS — I10 Essential (primary) hypertension: Secondary | ICD-10-CM | POA: Diagnosis not present

## 2022-07-06 DIAGNOSIS — Z01818 Encounter for other preprocedural examination: Secondary | ICD-10-CM | POA: Diagnosis not present

## 2022-07-06 DIAGNOSIS — C50211 Malignant neoplasm of upper-inner quadrant of right female breast: Secondary | ICD-10-CM

## 2022-07-06 DIAGNOSIS — Z79899 Other long term (current) drug therapy: Secondary | ICD-10-CM | POA: Insufficient documentation

## 2022-07-06 DIAGNOSIS — Z0189 Encounter for other specified special examinations: Secondary | ICD-10-CM | POA: Diagnosis not present

## 2022-07-06 LAB — ECHOCARDIOGRAM COMPLETE
Area-P 1/2: 4.68 cm2
S' Lateral: 2.5 cm

## 2022-07-06 NOTE — Progress Notes (Signed)
Oncology: Dr. Chryl Heck  52 y.o. with history of stage IV triple positive breast cancer presents for cardio-oncology evaluation.  Initial diagnosis in 1/14.  Bilateral mastectomy, ER+/PR+/HER2+.  She was treated with carboplatin/docetaxel/trastuzumab x 6 cycles then trastuzumab alone to complete 1 year.  She had radiation as well. In 4/23, patient was found to have recurrence with left malignant pleural effusion. She has completed chemo with docetaxel/trastuzumab/pertuzumab and will continue trastuzumab.    Echo in 10/23 showed. EF 60-65% with GLS -20.4% (stable). Echo was done today and reviewed, EF 60-65%, RV normal, GLS less negative at -15.5% but poor tracking of the endocardium.   Patient did not have cardiac complications from her initial chemotherapy involving trastuzumab in 2014.  She does have treated HTN.  No family history of cardiomyopathy or early CAD.  Nonsmoker.  No exertional dyspnea or chest pain.  She continues on trastuzumab.   Labs (5/23): K 3.3, creatinine 0.64 Labs (1/24): EF 3.3, creatinine 0.66, hgb 14.6  PMH: 1. HTN 2. Migraines 3. Breast cancer: Initial diagnosis in 1/14.  Bilateral mastectomy, ER+/PR+/HER2+.  She was treated with carboplatin/docetaxel/trastuzumab x 6 cycles then trastuzumab alone to complete 1 year.  She had radiation.  - 4/23 patient had recurrence with left malignant pleural effusion found. She has started chemo with docetaxel/trastuzumab/pertuzumab.  - Echo (4/23): EF 60-65%, GLS -16.8%, RV normal.  - Echo (7/23): EF 65-70%, GLS -21.3%, RV normal.  - Echo (10/23): EF 60-65%, GLS -75.9%, normal diastolic function, normal RV, mild MR - Echo (1/24): EF 60-65%, RV normal, GLS less negative at -15.5% but poor tracking of the endocardium.   Social History   Socioeconomic History   Marital status: Married    Spouse name: Not on file   Number of children: 2   Years of education: Not on file   Highest education level: Not on file  Occupational History     Employer: [employer]  Tobacco Use   Smoking status: Never   Smokeless tobacco: Never  Substance and Sexual Activity   Alcohol use: Yes    Comment: occasional   Drug use: No   Sexual activity: Yes    Birth control/protection: Surgical  Other Topics Concern   Not on file  Social History Narrative   Not on file   Social Determinants of Health   Financial Resource Strain: Not on file  Food Insecurity: Not on file  Transportation Needs: Not on file  Physical Activity: Not on file  Stress: Not on file  Social Connections: Not on file  Intimate Partner Violence: Not on file   Family History  Problem Relation Age of Onset   Hypertension Mother    Alcohol abuse Mother    Heart disease Maternal Grandmother    Stroke Maternal Grandfather    Colon cancer Maternal Aunt 3       alive at 76   Brain cancer Maternal Uncle 18       and lymphoma in early 25s; deceased   Brain cancer Maternal Uncle 60       deceased   Pancreatic cancer Maternal Uncle 43       alive at 20   Breast cancer Cousin 16       mat 1st cousin once removed through mat GF ; deceased   Breast cancer Maternal Aunt        great aunt through mat GF; dx at ? age   ROS: All systems reviewed and negative except as per HPI.   Current  Outpatient Medications  Medication Sig Dispense Refill   acetaminophen (TYLENOL) 500 MG tablet Take 1,000 mg by mouth every 6 (six) hours as needed for moderate pain.     amLODipine (NORVASC) 10 MG tablet Take 1 tablet (10 mg total) by mouth every morning. 30 tablet 3   KLOR-CON M20 20 MEQ tablet TAKE 1 TABLET BY MOUTH EVERY DAY 90 tablet 1   lidocaine-prilocaine (EMLA) cream Apply 1 application. topically as needed. 30 g 0   LORazepam (ATIVAN) 0.5 MG tablet Take 1 tablet (0.5 mg total) by mouth every 6 (six) hours as needed for anxiety. 30 tablet 0   tamoxifen (NOLVADEX) 20 MG tablet Take 1 tablet (20 mg total) by mouth daily. 90 tablet 3   No current facility-administered  medications for this encounter.   Facility-Administered Medications Ordered in Other Encounters  Medication Dose Route Frequency Provider Last Rate Last Admin   acetaminophen (TYLENOL) 325 MG tablet            diphenhydrAMINE (BENADRYL) 25 mg capsule            BP 130/80   Pulse 85   Wt 66.5 kg (146 lb 9.6 oz)   SpO2 100%   BMI 26.81 kg/m  General: NAD Neck: No JVD, no thyromegaly or thyroid nodule.  Lungs: Clear to auscultation bilaterally with normal respiratory effort. CV: Nondisplaced PMI.  Heart regular S1/S2, no S3/S4, no murmur.  No peripheral edema.  No carotid bruit.  Normal pedal pulses.  Abdomen: Soft, nontender, no hepatosplenomegaly, no distention.  Skin: Intact without lesions or rashes.  Neurologic: Alert and oriented x 3.  Psych: Normal affect. Extremities: No clubbing or cyanosis.  HEENT: Normal.   Assessment/Plan: 1. Stage IV triple positive breast cancer: Patient had trastuzumab as part of her therapy back in 2014 with no cardiac complications.  She has started trastuzumab-based therapy again.  Today's echo shows stable LVEF but with global longitudinal strain less negative.  I reviewed the study and think that endocardium was poorly tracked for strain and suspect not accurate.    - I will obtain repeat echo in 3 months.  - She can continue Herceptin.  2. HTN: BP controlled on current amlodipine, continue.   Loralie Champagne 07/06/2022

## 2022-07-06 NOTE — Patient Instructions (Signed)
There has been no changes to your medications.  Your physician has requested that you have an echocardiogram. Echocardiography is a painless test that uses sound waves to create images of your heart. It provides your doctor with information about the size and shape of your heart and how well your heart's chambers and valves are working. This procedure takes approximately one hour. There are no restrictions for this procedure. Please do NOT wear cologne, perfume, aftershave, or lotions (deodorant is allowed). Please arrive 15 minutes prior to your appointment time.  Your physician recommends that you schedule a follow-up appointment in: 3 months with echocardiogram ( April 2024)  **please call the office in February to arrange your follow up appointment.**  If you have any questions or concerns before your next appointment please send [place] a message through [place] or call our office at [phone].    TO LEAVE A MESSAGE FOR THE NURSE SELECT OPTION 2, PLEASE LEAVE A MESSAGE INCLUDING: YOUR NAME DATE OF BIRTH CALL BACK NUMBER REASON FOR CALL**this is important as we prioritize the call backs  YOU WILL RECEIVE A CALL BACK THE SAME DAY AS LONG AS YOU CALL BEFORE 4:00 PM  At the [facility], you and your health needs are our priority. As part of our continuing mission to provide you with exceptional heart care, we have created designated Provider Care Teams. These Care Teams include your primary Cardiologist (physician) and Advanced Practice Providers (APPs- Physician Assistants and Nurse Practitioners) who all work together to provide you with the care you need, when you need it.   You may see any of the following providers on your designated Care Team at your next follow up: Dr Glori Bickers Dr Loralie Champagne Dr. Roxana Hires, NP Lyda Jester, [place] [facility] [place], [place] Forestine Na, NP Audry Riles, PharmD   Please be sure to bring in all  your medications bottles to every appointment.

## 2022-07-06 NOTE — Progress Notes (Signed)
  Echocardiogram 2D Echocardiogram has been performed.  Sherry Barry 07/06/2022, 2:45 PM

## 2022-07-08 ENCOUNTER — Telehealth: Payer: Self-pay | Admitting: *Deleted

## 2022-07-08 NOTE — Telephone Encounter (Signed)
-----  Message from [name], MD sent at 07/08/2022  2:48 PM EST ----- Good news, scan looks very reassuring. No new disease or progression. Please let her know.

## 2022-07-08 NOTE — Telephone Encounter (Signed)
Per Dr.Iruku, called pt with message below. Pt verbalized understanding and advised to f/u as scheduled.

## 2022-07-09 ENCOUNTER — Inpatient Hospital Stay (HOSPITAL_BASED_OUTPATIENT_CLINIC_OR_DEPARTMENT_OTHER): Payer: Federal, State, Local not specified - PPO | Admitting: Hematology and Oncology

## 2022-07-09 ENCOUNTER — Encounter: Payer: Self-pay | Admitting: Hematology and Oncology

## 2022-07-09 ENCOUNTER — Inpatient Hospital Stay: Payer: Federal, State, Local not specified - PPO

## 2022-07-09 VITALS — BP 117/86 | HR 107 | Temp 97.9°F | Resp 16 | Ht 62.0 in | Wt 140.4 lb

## 2022-07-09 VITALS — BP 127/90 | HR 87 | Resp 16

## 2022-07-09 DIAGNOSIS — C50211 Malignant neoplasm of upper-inner quadrant of right female breast: Secondary | ICD-10-CM | POA: Diagnosis not present

## 2022-07-09 DIAGNOSIS — Z800 Family history of malignant neoplasm of digestive organs: Secondary | ICD-10-CM | POA: Diagnosis not present

## 2022-07-09 DIAGNOSIS — Z90710 Acquired absence of both cervix and uterus: Secondary | ICD-10-CM | POA: Diagnosis not present

## 2022-07-09 DIAGNOSIS — Z170 Estrogen receptor positive status [ER+]: Secondary | ICD-10-CM

## 2022-07-09 DIAGNOSIS — C7951 Secondary malignant neoplasm of bone: Secondary | ICD-10-CM | POA: Diagnosis not present

## 2022-07-09 DIAGNOSIS — Z9221 Personal history of antineoplastic chemotherapy: Secondary | ICD-10-CM | POA: Diagnosis not present

## 2022-07-09 DIAGNOSIS — Z9013 Acquired absence of bilateral breasts and nipples: Secondary | ICD-10-CM | POA: Diagnosis not present

## 2022-07-09 DIAGNOSIS — Z5112 Encounter for antineoplastic immunotherapy: Secondary | ICD-10-CM | POA: Diagnosis not present

## 2022-07-09 DIAGNOSIS — C50919 Malignant neoplasm of unspecified site of unspecified female breast: Secondary | ICD-10-CM

## 2022-07-09 DIAGNOSIS — Z923 Personal history of irradiation: Secondary | ICD-10-CM | POA: Diagnosis not present

## 2022-07-09 DIAGNOSIS — Z79810 Long term (current) use of selective estrogen receptor modulators (SERMs): Secondary | ICD-10-CM | POA: Diagnosis not present

## 2022-07-09 DIAGNOSIS — Z803 Family history of malignant neoplasm of breast: Secondary | ICD-10-CM | POA: Diagnosis not present

## 2022-07-09 LAB — CBC WITH DIFFERENTIAL/PLATELET
Abs Immature Granulocytes: 0.02 10*3/uL (ref 0.00–0.07)
Basophils Absolute: 0 10*3/uL (ref 0.0–0.1)
Basophils Relative: 1 %
Eosinophils Absolute: 0.2 10*3/uL (ref 0.0–0.5)
Eosinophils Relative: 3 %
HCT: 39.3 % (ref 36.0–46.0)
Hemoglobin: 13.9 g/dL (ref 12.0–15.0)
Immature Granulocytes: 0 %
Lymphocytes Relative: 34 %
Lymphs Abs: 2.1 10*3/uL (ref 0.7–4.0)
MCH: 28.4 pg (ref 26.0–34.0)
MCHC: 35.4 g/dL (ref 30.0–36.0)
MCV: 80.4 fL (ref 80.0–100.0)
Monocytes Absolute: 0.4 10*3/uL (ref 0.1–1.0)
Monocytes Relative: 6 %
Neutro Abs: 3.4 10*3/uL (ref 1.7–7.7)
Neutrophils Relative %: 56 %
Platelets: 228 10*3/uL (ref 150–400)
RBC: 4.89 MIL/uL (ref 3.87–5.11)
RDW: 15.3 % (ref 11.5–15.5)
WBC: 6.2 10*3/uL (ref 4.0–10.5)
nRBC: 0 % (ref 0.0–0.2)

## 2022-07-09 LAB — COMPREHENSIVE METABOLIC PANEL
ALT: 11 U/L (ref 0–44)
AST: 16 U/L (ref 15–41)
Albumin: 4 g/dL (ref 3.5–5.0)
Alkaline Phosphatase: 56 U/L (ref 38–126)
Anion gap: 7 (ref 5–15)
BUN: 12 mg/dL (ref 6–20)
CO2: 26 mmol/L (ref 22–32)
Calcium: 9.1 mg/dL (ref 8.9–10.3)
Chloride: 107 mmol/L (ref 98–111)
Creatinine, Ser: 0.64 mg/dL (ref 0.44–1.00)
GFR, Estimated: >60 mL/min (ref >60)
Glucose, Bld: 78 mg/dL (ref 70–99)
Potassium: 3.1 mmol/L — ABNORMAL LOW (ref 3.5–5.1)
Sodium: 140 mmol/L (ref 135–145)
Total Bilirubin: 0.5 mg/dL (ref 0.3–1.2)
Total Protein: 6.9 g/dL (ref 6.5–8.1)

## 2022-07-09 MED ORDER — SODIUM CHLORIDE 0.9% FLUSH
10.0000 mL | Freq: Once | INTRAVENOUS | Status: AC
  Administered 2022-07-09: 10 mL

## 2022-07-09 MED ORDER — TRASTUZUMAB-DKST CHEMO 150 MG IV SOLR
399.0000 mg | Freq: Once | INTRAVENOUS | Status: AC
  Administered 2022-07-09: 399 mg via INTRAVENOUS
  Filled 2022-07-09: qty 19

## 2022-07-09 MED ORDER — DIPHENHYDRAMINE HCL 25 MG PO CAPS
25.0000 mg | ORAL_CAPSULE | Freq: Once | ORAL | Status: AC
  Administered 2022-07-09: 25 mg via ORAL
  Filled 2022-07-09: qty 1

## 2022-07-09 MED ORDER — SODIUM CHLORIDE 0.9 % IV SOLN
420.0000 mg | Freq: Once | INTRAVENOUS | Status: AC
  Administered 2022-07-09: 420 mg via INTRAVENOUS
  Filled 2022-07-09: qty 14

## 2022-07-09 MED ORDER — GABAPENTIN 300 MG PO CAPS: 300.0000 mg | ORAL_CAPSULE | Freq: Every day | ORAL | 3 refills | Status: DC

## 2022-07-09 MED ORDER — ACETAMINOPHEN 325 MG PO TABS
650.0000 mg | ORAL_TABLET | Freq: Once | ORAL | Status: AC
  Administered 2022-07-09: 650 mg via ORAL
  Filled 2022-07-09: qty 2

## 2022-07-09 MED ORDER — HEPARIN SOD (PORK) LOCK FLUSH 100 UNIT/ML IV SOLN
500.0000 [IU] | Freq: Once | INTRAVENOUS | Status: AC | PRN
  Administered 2022-07-09: 500 [IU]

## 2022-07-09 MED ORDER — SODIUM CHLORIDE 0.9% FLUSH
10.0000 mL | INTRAVENOUS | Status: DC | PRN
  Administered 2022-07-09: 10 mL

## 2022-07-09 MED ORDER — SODIUM CHLORIDE 0.9 % IV SOLN: Freq: Once | INTRAVENOUS | Status: AC

## 2022-07-09 NOTE — Progress Notes (Signed)
Patient declined to stay for 30 minute post-observation period following Perjeta infusion.  Patient educated on importance of monitoring period and s/s of adverse reactions. Patient's vital signs retaken prior to discharge and remained within normal parameters.  Patient discharged in stable condition.

## 2022-07-09 NOTE — Patient Instructions (Signed)
[facility]   Discharge Instructions: Thank you for choosing [facility] to provide your oncology and hematology care.   If you have a lab appointment with the Cancer Center, please go directly to the Cancer Center and check in at the registration area.   Wear comfortable clothing and clothing appropriate for easy access to any Portacath or PICC line.   We strive to give you quality time with your provider. You may need to reschedule your appointment if you arrive late (15 or more minutes).  Arriving late affects you and other patients whose appointments are after yours.  Also, if you miss three or more appointments without notifying the office, you may be dismissed from the clinic at the provider's discretion.      For prescription refill requests, have your pharmacy contact our office and allow 72 hours for refills to be completed.    Today you received the following chemotherapy and/or immunotherapy agents: Trastuzumab (Herceptin) and Pertuzumab (Perjeta)       To help prevent nausea and vomiting after your treatment, we encourage you to take your nausea medication as directed.  BELOW ARE SYMPTOMS THAT SHOULD BE REPORTED IMMEDIATELY: *FEVER GREATER THAN 100.4 F (38 C) OR HIGHER *CHILLS OR SWEATING *NAUSEA AND VOMITING THAT IS NOT CONTROLLED WITH YOUR NAUSEA MEDICATION *UNUSUAL SHORTNESS OF BREATH *UNUSUAL BRUISING OR BLEEDING *URINARY PROBLEMS (pain or burning when urinating, or frequent urination) *BOWEL PROBLEMS (unusual diarrhea, constipation, pain near the anus) TENDERNESS IN MOUTH AND THROAT WITH OR WITHOUT PRESENCE OF ULCERS (sore throat, sores in mouth, or a toothache) UNUSUAL RASH, SWELLING OR PAIN  UNUSUAL VAGINAL DISCHARGE OR ITCHING   Items with * indicate a potential emergency and should be followed up as soon as possible or go to the Emergency Department if any problems should occur.  Please show the CHEMOTHERAPY ALERT  CARD or IMMUNOTHERAPY ALERT CARD at check-in to the Emergency Department and triage nurse.  Should you have questions after your visit or need to cancel or reschedule your appointment, please contact [facility]  Dept: [phone]  and follow the prompts.  Office hours are 8:00 a.m. to 4:30 p.m. Monday - Friday. Please note that voicemails left after 4:00 p.m. may not be returned until the following business day.  We are closed weekends and major holidays. You have access to a nurse at all times for urgent questions. Please call the main number to the clinic Dept: [phone] and follow the prompts.   For any non-urgent questions, you may also contact your provider using MyChart. We now offer e-Visits for anyone 18 and older to request care online for non-urgent symptoms. For details visit [email or web address].   Also download the MyChart app! Go to the app store, search "MyChart", open the app, select [facility], and log in with your MyChart username and password.  

## 2022-07-09 NOTE — Progress Notes (Signed)
[facility] Follow up:    Sherry Congress, NP [place] [address]   DIAGNOSIS:  Cancer Staging  Malignant neoplasm of upper-inner quadrant of right breast in female, estrogen receptor positive (Sherry Barry) Staging form: Breast, AJCC 7th Edition - Clinical: Stage IA (T1c, N0, cM0) - Unsigned Specimen type: Core Needle Biopsy Histopathologic type: 9931 Laterality: Right Staging comments: Staged at breast conference 1.15.14  - Pathologic stage from 07/14/2012: Stage IV ([place], NX, M1) - Signed by [name], MD on 08/28/2021 Specimen type: Core Needle Biopsy Histopathologic type: 9931 Laterality: Right   SUMMARY OF ONCOLOGIC HISTORY: Oncology History  Malignant neoplasm of upper-inner quadrant of right breast in female, estrogen receptor positive ([name])  07/13/2012 Clinical Stage   Stage IA: T1c N0   07/14/2012 Definitive Surgery   Bilateral mastectomy/right SLNB: RIGHT invasive ductal carcinoma, grade 3, ER+, PR +, Her2/neu positive (ratio 3.02), Ki67 48%. DCIS. 1/4 LN positive for malignancy. LEFT: benign   07/14/2012 Pathologic Stage   Stage IIB: T2 N1a M0   07/14/2012 Cancer Staging   Staging form: Breast, AJCC 7th Edition - Pathologic stage from 07/14/2012: Stage IV ([place], NX, M1) - Signed by [name], MD on 08/28/2021 Specimen type: Core Needle Biopsy Histopathologic type: 9931 Laterality: Right   08/17/2012 - 08/30/2013 Chemotherapy   Adjuvant carboplatin, docetaxel, and trastuzumab x 6 cycles (completed 11/30/2012) followed by maintenance trastuzumab to total one year   11/2012 Procedure   Comp Cancer Gene panel (Gene[facility]x) negative for deleterious mutations    - 02/2013 Radiation Therapy   Adjuvant RT to right breast   02/2013 -  Anti-estrogen oral therapy   Tamoxifen 20 mg daily   09/24/2013 Surgery   Bilateral breast reconstruction with latissmus flap and expander placement   12/12/2013 Surgery   Implant placement     Relapse/Recurrence   Left sided malignant pleural effusion.  Tumor cells are positive for GATA3 and ER, negative for TTF-1 consistent with recurrent breast carcinoma Prognostic showed ER 90% positive strong staining, PR 80% positive strong staining, HER2 negative.    09/16/2021 Imaging   PET scan showed malignant left pleural effusion with extensive areas of nodularity and hypermetabolic activity involving the mediastinal border and pericardium as well as the most inferior aspects of the costodiaphragmatic recess.  Signs of nodal disease at the thoracic inlet on the left suspect extension of disease below the diaphragm along the left anterolateral aorta.  Nonspecific moderate to markedly increased metabolic activity about the base of the tongue bilaterally.  Consider direct visualization showing mildly asymmetric uptake favoring the right lingual tonsil.  Sclerotic foci without marked increased metabolic activity suspicious for metastatic disease perhaps treated or questions.  Heterogeneous marrow uptake seen elsewhere generalized and nonspecific.  Consider spinal MRI is warranted  MRI brain without any evidence of intracranial metastatic disease.   10/09/2021 - 02/11/2022 Chemotherapy   Taxotere, Herceptin, Perjeta x 6   12/04/2021 Imaging   There is no evidence new metastatic disease. There is interval decrease in the left pleural effusion possibly suggesting resolving pleural metastatic disease. Few scattered sclerotic metastatic lesions in the skeletal structures have not changed significantly.   No acute findings are seen in the chest abdomen and pelvis.     02/22/2022 -  Antibody Plan   Herceptin/Perjeta every 3 weeks   03/21/2022 Imaging   CT chest abdomen pelvis  IMPRESSION: 1. No significant change since previous study. Again demonstrated is a chronic small left pleural effusion with basilar  atelectasis and scattered sclerotic skeletal metastases. 2. No acute abnormalities are  demonstrated. 3. Small esophageal hiatal hernia.     Electronically Signed   By: Lucienne Capers M.D.   On: 03/21/2022 20:23     CURRENT THERAPY: THP  INTERVAL HISTORY:  Sherry Barry 52 y.o. female returns for follow-up Since last visit, she reports ongoing fatigue and hot flashes especially at night.  She followed up with Dr Aundra Dubin, ok to continue herceptin. She denies any change in bowel habits or urinary habits.  No new neurological complaints. Rest of the pertinent 10 point ROS reviewed and negative.  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  MEDICAL HISTORY: Past Medical History:  Diagnosis Date   Allergy    Breast cancer ([place])    Breast cancer ([place])    right   History of chemotherapy    doxetaxel/carboplatin/trastuzumab   History of migraine    last one about a week ago   Hx of radiation therapy 01/01/13- 02/15/13   r chest wall, r supraclav/axilla 5040 cGy/28 sessions, scar boost 1000 cGy/5 sessions   Hypertension    no meds,   urgent care on pomana   Migraine    Migraine     SURGICAL HISTORY: Past Surgical History:  Procedure Laterality Date   ABDOMINAL HYSTERECTOMY     no salpingo-oophorectomy 2009   BREAST RECONSTRUCTION WITH PLACEMENT OF TISSUE EXPANDER AND FLEX HD (ACELLULAR  HYDRATED DERMIS) Left 09/24/2013   IR IMAGING GUIDED PORT INSERTION  10/07/2021   IR THORACENTESIS ASP PLEURAL SPACE W/IMG GUIDE  08/11/2021   IR THORACENTESIS ASP PLEURAL SPACE W/IMG GUIDE  10/07/2021   LATISSIMUS FLAP TO BREAST Right 09/24/2013   Procedure: RIGHT LATISSMUS MYOCUTAEIOUS MUSCLE FLAP AND PLACEMENT OF TISSUE [name];  Surgeon: Theodoro Kos, DO;  Location: [facility];  Service: Plastics;  Laterality: Right;   LIPOSUCTION WITH LIPOFILLING Bilateral 12/12/2013   Procedure: LIPOSUCTION WITH LIPOFILLING;  Surgeon: Theodoro Kos, DO;  Location: [facility];  Service: Plastics;  Laterality: Bilateral;   PORT-A-CATH REMOVAL Left 12/12/2013   Procedure: REMOVAL PORT-A-CATH;  Surgeon: Theodoro Kos, DO;  Location: [facility];  Service: Plastics;  Laterality: Left;   PORTACATH PLACEMENT  07/14/2012   Procedure: INSERTION PORT-A-CATH;  Surgeon: Joyice Faster. Cornett, MD;  Location: [facility];  Service: General;  Laterality: Left;   RECONSTRUCTION BREAST W/ LATISSIMUS DORSI FLAP Right 09/24/2013   "& tissue expander placement"   REMOVAL OF BILATERAL TISSUE EXPANDERS WITH PLACEMENT OF BILATERAL BREAST IMPLANTS Bilateral 12/12/2013   Procedure: REMOVAL OF BILATERAL TISSUE EXPANDERS WITH PLACEMENT OF BILATERAL BREAST IMPLANTS/BILATERAL CAPSULECTOMIES WITH  LIPOFILLING FAT GRAFTING;  Surgeon: Theodoro Kos, DO;  Location: [facility];  Service: Plastics;  Laterality: Bilateral;   SIMPLE MASTECTOMY WITH AXILLARY SENTINEL NODE BIOPSY  07/14/2012   Procedure: SIMPLE MASTECTOMY WITH AXILLARY SENTINEL NODE BIOPSY;  Surgeon: Joyice Faster. Cornett, MD;  [facility];  Service: General;  Laterality: Right;  Bilateral simple mastectomy with port and right sebtibel lymph node mapping   SIMPLE MASTECTOMY WITH AXILLARY SENTINEL NODE BIOPSY  07/14/2012   Procedure: SIMPLE MASTECTOMY;  Surgeon: Joyice Faster. Cornett, MD;  Location: [facility];  Service: General;  Laterality: Left;   TISSUE EXPANDER  PLACEMENT Left 09/24/2013   Procedure: PLACEMENT OF TISSUE EXPANDER AND FLEX HD TO LEFT BREAST;  Surgeon: Theodoro Kos, DO;  Location: [facility];  Service: Plastics;  Laterality: Left;    SOCIAL HISTORY: Social History   Socioeconomic History   Marital status: Married    Spouse name: Not on file   Number of children: 2   Years of education: Not on file   Highest education level: Not on file  Occupational History    Employer: [place] [employer]  Tobacco Use   Smoking status: Never   Smokeless tobacco: Never  Substance and Sexual Activity   Alcohol use: Yes    Comment: occasional   Drug use: No   Sexual activity: Yes    Birth control/protection: Surgical  Other Topics Concern   Not on file  Social History Narrative   Not on file   Social Determinants of Health   Financial Resource Strain: Not on file  Food Insecurity: Not on file  Transportation Needs: Not on file  Physical Activity: Not on file  Stress: Not on file  Social Connections: Not on file  Intimate Partner Violence: Not on file    FAMILY HISTORY: Family History  Problem Relation Age of Onset   Hypertension Mother    Alcohol abuse Mother    Heart disease Maternal Grandmother    Stroke Maternal Grandfather    Colon cancer Maternal Aunt 44       alive at 68   Brain cancer Maternal Uncle 65       and lymphoma in early 19s; deceased   Brain cancer Maternal Uncle 60       deceased   Pancreatic cancer Maternal Uncle 81       alive at 13   Breast cancer Cousin 1       mat 1st cousin once removed through mat GF ; deceased   Breast cancer Maternal Aunt        great aunt through mat GF; dx at ? age    Review of Systems  Constitutional:  Positive for fatigue. Negative for appetite change, chills, fever and unexpected weight change.  HENT:   Negative for hearing loss, lump/mass and trouble swallowing.   Eyes:  Negative for eye problems and icterus.  Respiratory:  Negative for chest tightness and cough.    Cardiovascular:  Negative for chest pain, leg swelling and palpitations.  Gastrointestinal:  Negative for abdominal distention, abdominal pain, constipation, diarrhea, nausea and vomiting.  Endocrine: Negative for hot flashes.  Genitourinary:  Negative for difficulty urinating.   Musculoskeletal:  Negative for arthralgias and myalgias.  Skin:  Negative for itching and rash.  Neurological:  Negative for dizziness, extremity weakness, headaches and numbness.  Hematological:  Negative for adenopathy. Does not bruise/bleed easily.  Psychiatric/Behavioral:  Negative for depression. The patient is not nervous/anxious.     PHYSICAL EXAMINATION  ECOG PERFORMANCE STATUS: 1 - Symptomatic but completely ambulatory  Vitals:   07/09/22 1307  BP: 117/86  Pulse: (!) 107  Resp: 16  Temp: 97.9 F (36.6 C)  SpO2: 97%   Physical Exam Constitutional:      General: She is not  in acute distress.    Appearance: Normal appearance. She is not toxic-appearing.  HENT:     Head: Normocephalic and atraumatic.  Eyes:     General: No scleral icterus. Cardiovascular:     Rate and Rhythm: Regular rhythm.     Pulses: Normal pulses.     Heart sounds: Normal heart sounds.  Pulmonary:     Effort: Pulmonary effort is normal.     Breath sounds: Normal breath sounds.  Abdominal:     General: Abdomen is flat. Bowel sounds are normal. There is no distension.     Palpations: Abdomen is soft.     Tenderness: There is no abdominal tenderness.  Musculoskeletal:        General: No swelling.     Cervical back: Neck supple.  Lymphadenopathy:     Cervical: No cervical adenopathy.  Skin:    General: Skin is warm and dry.     Findings: No rash.  Neurological:     General: No focal deficit present.     Mental Status: She is alert.  Psychiatric:        Mood and Affect: Mood normal.        Behavior: Behavior normal.     LABORATORY DATA:  CBC    Component Value Date/Time   WBC 6.2 06/18/2022 1259   RBC  5.22 (H) 06/18/2022 1259   HGB 14.6 06/18/2022 1259   HGB 13.4 03/26/2022 1329   HGB 15.9 10/30/2019 1030   HGB 14.7 02/15/2017 0852   HCT 41.4 06/18/2022 1259   HCT 46.1 10/30/2019 1030   HCT 42.9 02/15/2017 0852   PLT 242 06/18/2022 1259   PLT 261 03/26/2022 1329   PLT 245 10/30/2019 1030   MCV 79.3 (L) 06/18/2022 1259   MCV 88 10/30/2019 1030   MCV 86.7 02/15/2017 0852   MCH 28.0 06/18/2022 1259   MCHC 35.3 06/18/2022 1259   RDW 15.3 06/18/2022 1259   RDW 12.5 10/30/2019 1030   RDW 12.6 02/15/2017 0852   LYMPHSABS 2.0 06/18/2022 1259   LYMPHSABS 1.4 02/15/2017 0852   MONOABS 0.4 06/18/2022 1259   MONOABS 0.3 02/15/2017 0852   EOSABS 0.1 06/18/2022 1259   EOSABS 0.1 02/15/2017 0852   BASOSABS 0.0 06/18/2022 1259   BASOSABS 0.0 02/15/2017 0852    CMP     Component Value Date/Time   NA 141 06/18/2022 1259   NA 140 10/30/2019 1030   NA 141 02/15/2017 0852   K 3.3 (L) 06/18/2022 1259   K 3.9 02/15/2017 0852   CL 108 06/18/2022 1259   CL 101 11/15/2012 0904   CO2 25 06/18/2022 1259   CO2 25 02/15/2017 0852   GLUCOSE 82 06/18/2022 1259   GLUCOSE 87 02/15/2017 0852   GLUCOSE 69 (L) 11/15/2012 0904   BUN 13 06/18/2022 1259   BUN 11 10/30/2019 1030   BUN 12.5 02/15/2017 0852   CREATININE 0.66 06/18/2022 1259   CREATININE 0.66 03/26/2022 1329   CREATININE 0.9 02/15/2017 0852   CALCIUM 8.5 (L) 06/18/2022 1259   CALCIUM 9.3 02/15/2017 0852   PROT 6.6 06/18/2022 1259   PROT 6.7 10/30/2019 1030   PROT 7.0 02/15/2017 0852   ALBUMIN 4.3 06/18/2022 1259   ALBUMIN 4.2 10/30/2019 1030   ALBUMIN 3.6 02/15/2017 0852   AST 17 06/18/2022 1259   AST 15 03/26/2022 1329   AST 16 02/15/2017 0852   ALT 13 06/18/2022 1259   ALT 9 03/26/2022 1329   ALT 13 02/15/2017 0852  ALKPHOS 65 06/18/2022 1259   ALKPHOS 63 02/15/2017 0852   BILITOT 0.4 06/18/2022 1259   BILITOT 0.4 03/26/2022 1329   BILITOT 0.69 02/15/2017 0852   GFRNONAA >60 06/18/2022 1259   GFRNONAA >60 03/26/2022  1329   GFRAA 100 10/30/2019 1030     ASSESSMENT and THERAPY PLAN:  Sherry Barry is a 52 y.o. female who returns for a follow-up for stage IV triple positive breast cancer.  #Stage IV triple positive breast cancer:  --Currently treatment includes Herceptin, pertuzumab and tamoxifen -- She is now on Herceptin and pertuzumab maintenance.   --She followed up with Dr Aundra Dubin who recommended ECHO in 3 months, continue herceptin --Most recent imaging with no evidence of active disease. --No dental concerns reported. Continue zoledronic acid as scheduled. --Continue herceptin/perjeta maintenance and follow up every 6 weeks with me --Will plan repeat imaging in 3/4 months. --Prescribed gabapentin 300 mg QHS for hot flashes.  Follow up:  She understands that the treatment is indefinite as long as it works for her or as long as there is no unacceptable toxicity. Zometa given history of bone metastasis, last dose on December 15, next due on March 3 Total time spent: 30 minutes  [name] MD

## 2022-07-14 ENCOUNTER — Other Ambulatory Visit: Payer: Self-pay

## 2022-07-19 ENCOUNTER — Other Ambulatory Visit: Payer: Self-pay

## 2022-07-23 ENCOUNTER — Ambulatory Visit: Payer: Federal, State, Local not specified - PPO

## 2022-07-30 ENCOUNTER — Inpatient Hospital Stay: Payer: Federal, State, Local not specified - PPO

## 2022-07-30 ENCOUNTER — Ambulatory Visit: Payer: Federal, State, Local not specified - PPO | Admitting: Adult Health

## 2022-07-30 ENCOUNTER — Inpatient Hospital Stay: Payer: Federal, State, Local not specified - PPO | Attending: Hematology and Oncology

## 2022-07-30 VITALS — BP 125/92 | HR 78 | Temp 98.8°F | Resp 16 | Wt 141.2 lb

## 2022-07-30 DIAGNOSIS — Z170 Estrogen receptor positive status [ER+]: Secondary | ICD-10-CM

## 2022-07-30 DIAGNOSIS — Z923 Personal history of irradiation: Secondary | ICD-10-CM | POA: Diagnosis not present

## 2022-07-30 DIAGNOSIS — Z5112 Encounter for antineoplastic immunotherapy: Secondary | ICD-10-CM | POA: Insufficient documentation

## 2022-07-30 DIAGNOSIS — C50211 Malignant neoplasm of upper-inner quadrant of right female breast: Secondary | ICD-10-CM | POA: Insufficient documentation

## 2022-07-30 DIAGNOSIS — C7951 Secondary malignant neoplasm of bone: Secondary | ICD-10-CM | POA: Diagnosis not present

## 2022-07-30 DIAGNOSIS — C50919 Malignant neoplasm of unspecified site of unspecified female breast: Secondary | ICD-10-CM

## 2022-07-30 LAB — CBC WITH DIFFERENTIAL/PLATELET
Abs Immature Granulocytes: 0.02 10*3/uL (ref 0.00–0.07)
Basophils Absolute: 0 10*3/uL (ref 0.0–0.1)
Basophils Relative: 1 %
Eosinophils Absolute: 0.2 10*3/uL (ref 0.0–0.5)
Eosinophils Relative: 3 %
HCT: 38 % (ref 36.0–46.0)
Hemoglobin: 13.3 g/dL (ref 12.0–15.0)
Immature Granulocytes: 0 %
Lymphocytes Relative: 30 %
Lymphs Abs: 1.5 10*3/uL (ref 0.7–4.0)
MCH: 29.1 pg (ref 26.0–34.0)
MCHC: 35 g/dL (ref 30.0–36.0)
MCV: 83.2 fL (ref 80.0–100.0)
Monocytes Absolute: 0.3 10*3/uL (ref 0.1–1.0)
Monocytes Relative: 6 %
Neutro Abs: 3.1 10*3/uL (ref 1.7–7.7)
Neutrophils Relative %: 60 %
Platelets: 226 10*3/uL (ref 150–400)
RBC: 4.57 MIL/uL (ref 3.87–5.11)
RDW: 14.1 % (ref 11.5–15.5)
WBC: 5.1 10*3/uL (ref 4.0–10.5)
nRBC: 0 % (ref 0.0–0.2)

## 2022-07-30 LAB — COMPREHENSIVE METABOLIC PANEL
ALT: 10 U/L (ref 0–44)
AST: 14 U/L — ABNORMAL LOW (ref 15–41)
Albumin: 4 g/dL (ref 3.5–5.0)
Alkaline Phosphatase: 49 U/L (ref 38–126)
Anion gap: 5 (ref 5–15)
BUN: 12 mg/dL (ref 6–20)
CO2: 29 mmol/L (ref 22–32)
Calcium: 8.7 mg/dL — ABNORMAL LOW (ref 8.9–10.3)
Chloride: 108 mmol/L (ref 98–111)
Creatinine, Ser: 0.61 mg/dL (ref 0.44–1.00)
GFR, Estimated: >60 mL/min (ref >60)
Glucose, Bld: 84 mg/dL (ref 70–99)
Potassium: 3.5 mmol/L (ref 3.5–5.1)
Sodium: 142 mmol/L (ref 135–145)
Total Bilirubin: 0.5 mg/dL (ref 0.3–1.2)
Total Protein: 6.5 g/dL (ref 6.5–8.1)

## 2022-07-30 MED ORDER — DIPHENHYDRAMINE HCL 25 MG PO CAPS
25.0000 mg | ORAL_CAPSULE | Freq: Once | ORAL | Status: AC
  Administered 2022-07-30: 25 mg via ORAL
  Filled 2022-07-30: qty 1

## 2022-07-30 MED ORDER — ACETAMINOPHEN 325 MG PO TABS
650.0000 mg | ORAL_TABLET | Freq: Once | ORAL | Status: AC
  Administered 2022-07-30: 650 mg via ORAL
  Filled 2022-07-30: qty 2

## 2022-07-30 MED ORDER — SODIUM CHLORIDE 0.9% FLUSH
10.0000 mL | INTRAVENOUS | Status: DC | PRN
  Administered 2022-07-30: 10 mL

## 2022-07-30 MED ORDER — SODIUM CHLORIDE 0.9 % IV SOLN: Freq: Once | INTRAVENOUS | Status: AC

## 2022-07-30 MED ORDER — HEPARIN SOD (PORK) LOCK FLUSH 100 UNIT/ML IV SOLN
500.0000 [IU] | Freq: Once | INTRAVENOUS | Status: AC | PRN
  Administered 2022-07-30: 500 [IU]

## 2022-07-30 MED ORDER — SODIUM CHLORIDE 0.9 % IV SOLN
420.0000 mg | Freq: Once | INTRAVENOUS | Status: AC
  Administered 2022-07-30: 420 mg via INTRAVENOUS
  Filled 2022-07-30: qty 14

## 2022-07-30 MED ORDER — SODIUM CHLORIDE 0.9% FLUSH
10.0000 mL | Freq: Once | INTRAVENOUS | Status: AC
  Administered 2022-07-30: 10 mL

## 2022-07-30 MED ORDER — TRASTUZUMAB-DKST CHEMO 150 MG IV SOLR: 6.0000 mg/kg | Freq: Once | INTRAVENOUS | Status: DC

## 2022-07-30 MED ORDER — TRASTUZUMAB-DKST CHEMO 150 MG IV SOLR
399.0000 mg | Freq: Once | INTRAVENOUS | Status: AC
  Administered 2022-07-30: 399 mg via INTRAVENOUS
  Filled 2022-07-30: qty 19

## 2022-07-30 NOTE — Patient Instructions (Signed)
[facility]  Discharge Instructions: Thank you for choosing [facility] to provide your oncology and hematology care.   If you have a lab appointment with the [facility], please go directly to the [facility] and check in at the registration area.   Wear comfortable clothing and clothing appropriate for easy access to any Portacath or PICC line.   We strive to give you quality time with your provider. You may need to reschedule your appointment if you arrive late (15 or more minutes).  Arriving late affects you and other patients whose appointments are after yours.  Also, if you miss three or more appointments without notifying the office, you may be dismissed from the clinic at the provider's discretion.      For prescription refill requests, have your pharmacy contact our office and allow 72 hours for refills to be completed.    Today you received the following chemotherapy and/or immunotherapy agents: trastuzumab (ogivri) & pertuzumab (perjeta)      To help prevent nausea and vomiting after your treatment, we encourage you to take your nausea medication as directed.  BELOW ARE SYMPTOMS THAT SHOULD BE REPORTED IMMEDIATELY: *FEVER GREATER THAN 100.4 F (38 C) OR HIGHER *CHILLS OR SWEATING *NAUSEA AND VOMITING THAT IS NOT CONTROLLED WITH YOUR NAUSEA MEDICATION *UNUSUAL SHORTNESS OF BREATH *UNUSUAL BRUISING OR BLEEDING *URINARY PROBLEMS (pain or burning when urinating, or frequent urination) *BOWEL PROBLEMS (unusual diarrhea, constipation, pain near the anus) TENDERNESS IN MOUTH AND THROAT WITH OR WITHOUT PRESENCE OF ULCERS (sore throat, sores in mouth, or a toothache) UNUSUAL RASH, SWELLING OR PAIN  UNUSUAL VAGINAL DISCHARGE OR ITCHING   Items with * indicate a potential emergency and should be followed up as soon as possible or go to the Emergency Department if any problems should occur.  Please show the CHEMOTHERAPY ALERT CARD or  IMMUNOTHERAPY ALERT CARD at check-in to the Emergency Department and triage nurse.  Should you have questions after your visit or need to cancel or reschedule your appointment, please contact [facility]  Dept: [phone]  and follow the prompts.  Office hours are 8:00 a.m. to 4:30 p.m. Monday - Friday. Please note that voicemails left after 4:00 p.m. may not be returned until the following business day.  We are closed weekends and major holidays. You have access to a nurse at all times for urgent questions. Please call the main number to the clinic Dept: [phone] and follow the prompts.   For any non-urgent questions, you may also contact your provider using MyChart. We now offer e-Visits for anyone 65 and older to request care online for non-urgent symptoms. For details visit [email or web address].   Also download the MyChart app! Go to the app store, search "MyChart", open the app, select [facility], and log in with your MyChart username and password.

## 2022-08-12 DIAGNOSIS — L2089 Other atopic dermatitis: Secondary | ICD-10-CM | POA: Diagnosis not present

## 2022-08-20 ENCOUNTER — Inpatient Hospital Stay: Payer: Federal, State, Local not specified - PPO | Attending: Hematology and Oncology

## 2022-08-20 ENCOUNTER — Inpatient Hospital Stay: Payer: Federal, State, Local not specified - PPO

## 2022-08-20 ENCOUNTER — Encounter: Payer: Self-pay | Admitting: Adult Health

## 2022-08-20 ENCOUNTER — Inpatient Hospital Stay (HOSPITAL_BASED_OUTPATIENT_CLINIC_OR_DEPARTMENT_OTHER): Payer: Federal, State, Local not specified - PPO | Admitting: Adult Health

## 2022-08-20 VITALS — BP 123/89 | HR 91 | Temp 97.7°F | Resp 16 | Ht 62.0 in | Wt 140.3 lb

## 2022-08-20 VITALS — BP 130/90 | HR 90

## 2022-08-20 DIAGNOSIS — Z79899 Other long term (current) drug therapy: Secondary | ICD-10-CM | POA: Insufficient documentation

## 2022-08-20 DIAGNOSIS — I10 Essential (primary) hypertension: Secondary | ICD-10-CM | POA: Insufficient documentation

## 2022-08-20 DIAGNOSIS — Z170 Estrogen receptor positive status [ER+]: Secondary | ICD-10-CM | POA: Diagnosis not present

## 2022-08-20 DIAGNOSIS — Z9013 Acquired absence of bilateral breasts and nipples: Secondary | ICD-10-CM | POA: Insufficient documentation

## 2022-08-20 DIAGNOSIS — C7951 Secondary malignant neoplasm of bone: Secondary | ICD-10-CM | POA: Diagnosis not present

## 2022-08-20 DIAGNOSIS — Z79810 Long term (current) use of selective estrogen receptor modulators (SERMs): Secondary | ICD-10-CM | POA: Diagnosis not present

## 2022-08-20 DIAGNOSIS — C50211 Malignant neoplasm of upper-inner quadrant of right female breast: Secondary | ICD-10-CM | POA: Insufficient documentation

## 2022-08-20 DIAGNOSIS — Z923 Personal history of irradiation: Secondary | ICD-10-CM | POA: Diagnosis not present

## 2022-08-20 DIAGNOSIS — Z9221 Personal history of antineoplastic chemotherapy: Secondary | ICD-10-CM | POA: Diagnosis not present

## 2022-08-20 DIAGNOSIS — M25552 Pain in left hip: Secondary | ICD-10-CM | POA: Diagnosis not present

## 2022-08-20 DIAGNOSIS — M7062 Trochanteric bursitis, left hip: Secondary | ICD-10-CM | POA: Insufficient documentation

## 2022-08-20 DIAGNOSIS — Z5112 Encounter for antineoplastic immunotherapy: Secondary | ICD-10-CM | POA: Diagnosis not present

## 2022-08-20 DIAGNOSIS — C50919 Malignant neoplasm of unspecified site of unspecified female breast: Secondary | ICD-10-CM

## 2022-08-20 LAB — CBC WITH DIFFERENTIAL/PLATELET
Abs Immature Granulocytes: 0.01 10*3/uL (ref 0.00–0.07)
Basophils Absolute: 0 10*3/uL (ref 0.0–0.1)
Basophils Relative: 1 %
Eosinophils Absolute: 0.1 10*3/uL (ref 0.0–0.5)
Eosinophils Relative: 2 %
HCT: 39.7 % (ref 36.0–46.0)
Hemoglobin: 13.8 g/dL (ref 12.0–15.0)
Immature Granulocytes: 0 %
Lymphocytes Relative: 30 %
Lymphs Abs: 1.7 10*3/uL (ref 0.7–4.0)
MCH: 29.6 pg (ref 26.0–34.0)
MCHC: 34.8 g/dL (ref 30.0–36.0)
MCV: 85.2 fL (ref 80.0–100.0)
Monocytes Absolute: 0.4 10*3/uL (ref 0.1–1.0)
Monocytes Relative: 6 %
Neutro Abs: 3.5 10*3/uL (ref 1.7–7.7)
Neutrophils Relative %: 61 %
Platelets: 228 10*3/uL (ref 150–400)
RBC: 4.66 MIL/uL (ref 3.87–5.11)
RDW: 13.2 % (ref 11.5–15.5)
WBC: 5.7 10*3/uL (ref 4.0–10.5)
nRBC: 0 % (ref 0.0–0.2)

## 2022-08-20 LAB — COMPREHENSIVE METABOLIC PANEL WITH GFR
ALT: 11 U/L (ref 0–44)
AST: 16 U/L (ref 15–41)
Albumin: 4.3 g/dL (ref 3.5–5.0)
Alkaline Phosphatase: 52 U/L (ref 38–126)
Anion gap: 8 (ref 5–15)
BUN: 11 mg/dL (ref 6–20)
CO2: 27 mmol/L (ref 22–32)
Calcium: 9 mg/dL (ref 8.9–10.3)
Chloride: 107 mmol/L (ref 98–111)
Creatinine, Ser: 0.79 mg/dL (ref 0.44–1.00)
GFR, Estimated: >60 mL/min (ref >60)
Glucose, Bld: 86 mg/dL (ref 70–99)
Potassium: 3.5 mmol/L (ref 3.5–5.1)
Sodium: 142 mmol/L (ref 135–145)
Total Bilirubin: 0.4 mg/dL (ref 0.3–1.2)
Total Protein: 6.9 g/dL (ref 6.5–8.1)

## 2022-08-20 MED ORDER — DIPHENHYDRAMINE HCL 25 MG PO CAPS
25.0000 mg | ORAL_CAPSULE | Freq: Once | ORAL | Status: AC
  Administered 2022-08-20: 25 mg via ORAL
  Filled 2022-08-20: qty 1

## 2022-08-20 MED ORDER — GOSERELIN ACETATE 10.8 MG ~~LOC~~ IMPL
10.8000 mg | DRUG_IMPLANT | Freq: Once | SUBCUTANEOUS | Status: AC
  Administered 2022-08-20: 10.8 mg via SUBCUTANEOUS
  Filled 2022-08-20: qty 10.8

## 2022-08-20 MED ORDER — SODIUM CHLORIDE 0.9 % IV SOLN: Freq: Once | INTRAVENOUS | Status: AC

## 2022-08-20 MED ORDER — ACETAMINOPHEN 325 MG PO TABS
650.0000 mg | ORAL_TABLET | Freq: Once | ORAL | Status: AC
  Administered 2022-08-20: 650 mg via ORAL
  Filled 2022-08-20: qty 2

## 2022-08-20 MED ORDER — HEPARIN SOD (PORK) LOCK FLUSH 100 UNIT/ML IV SOLN
500.0000 [IU] | Freq: Once | INTRAVENOUS | Status: AC | PRN
  Administered 2022-08-20: 500 [IU]

## 2022-08-20 MED ORDER — ZOLEDRONIC ACID 4 MG/100ML IV SOLN
4.0000 mg | Freq: Once | INTRAVENOUS | Status: AC
  Administered 2022-08-20: 4 mg via INTRAVENOUS
  Filled 2022-08-20: qty 100

## 2022-08-20 MED ORDER — SODIUM CHLORIDE 0.9% FLUSH
10.0000 mL | INTRAVENOUS | Status: DC | PRN
  Administered 2022-08-20: 10 mL

## 2022-08-20 MED ORDER — SODIUM CHLORIDE 0.9% FLUSH
10.0000 mL | Freq: Once | INTRAVENOUS | Status: AC
  Administered 2022-08-20: 10 mL

## 2022-08-20 MED ORDER — SODIUM CHLORIDE 0.9 % IV SOLN
420.0000 mg | Freq: Once | INTRAVENOUS | Status: AC
  Administered 2022-08-20: 420 mg via INTRAVENOUS
  Filled 2022-08-20: qty 14

## 2022-08-20 MED ORDER — TRASTUZUMAB-DKST CHEMO 150 MG IV SOLR
6.0000 mg/kg | Freq: Once | INTRAVENOUS | Status: AC
  Administered 2022-08-20: 420 mg via INTRAVENOUS
  Filled 2022-08-20: qty 20

## 2022-08-20 NOTE — Progress Notes (Signed)
[facility] Follow up:    Sherry Congress, NP [place] [address]   DIAGNOSIS:  Cancer Staging  Malignant neoplasm of upper-inner quadrant of right breast in female, estrogen receptor positive (Sherry Barry) Staging form: Breast, AJCC 7th Edition - Clinical: Stage IA (T1c, N0, cM0) - Unsigned Specimen type: Core Needle Biopsy Histopathologic type: 9931 Laterality: Right Staging comments: Staged at breast conference 1.15.14  - Pathologic stage from 07/14/2012: Stage IV ([place], NX, M1) - Signed by [name], MD on 08/28/2021 Specimen type: Core Needle Biopsy Histopathologic type: 9931 Laterality: Right   SUMMARY OF ONCOLOGIC HISTORY: Oncology History  Malignant neoplasm of upper-inner quadrant of right breast in female, estrogen receptor positive (Sherry Barry)  07/13/2012 Clinical Stage   Stage IA: T1c N0   07/14/2012 Definitive Surgery   Bilateral mastectomy/right SLNB: RIGHT invasive ductal carcinoma, grade 3, ER+, PR +, Her2/neu positive (ratio 3.02), Ki67 48%. DCIS. 1/4 LN positive for malignancy. LEFT: benign   07/14/2012 Pathologic Stage   Stage IIB: T2 N1a M0   07/14/2012 Cancer Staging   Staging form: Breast, AJCC 7th Edition - Pathologic stage from 07/14/2012: Stage IV ([place], NX, M1) - Signed by [name], MD on 08/28/2021 Specimen type: Core Needle Biopsy Histopathologic type: 9931 Laterality: Right   08/17/2012 - 08/30/2013 Chemotherapy   Adjuvant carboplatin, docetaxel, and trastuzumab x 6 cycles (completed 11/30/2012) followed by maintenance trastuzumab to total one year   11/2012 Procedure   Comp Cancer Gene panel ([facility]) negative for deleterious mutations    - 02/2013 Radiation Therapy   Adjuvant RT to right breast   02/2013 -  Anti-estrogen oral therapy   Tamoxifen 20 mg daily   09/24/2013 Surgery   Bilateral breast reconstruction with latissmus flap and expander placement   12/12/2013 Surgery   Implant placement     Relapse/Recurrence   Left sided malignant pleural effusion.  Tumor cells are positive for GATA3 and ER, negative for TTF-1 consistent with recurrent breast carcinoma Prognostic showed ER 90% positive strong staining, PR 80% positive strong staining, HER2 negative.    09/16/2021 Imaging   PET scan showed malignant left pleural effusion with extensive areas of nodularity and hypermetabolic activity involving the mediastinal border and pericardium as well as the most inferior aspects of the costodiaphragmatic recess.  Signs of nodal disease at the thoracic inlet on the left suspect extension of disease below the diaphragm along the left anterolateral aorta.  Nonspecific moderate to markedly increased metabolic activity about the base of the tongue bilaterally.  Consider direct visualization showing mildly asymmetric uptake favoring the right lingual tonsil.  Sclerotic foci without marked increased metabolic activity suspicious for metastatic disease perhaps treated or questions.  Heterogeneous marrow uptake seen elsewhere generalized and nonspecific.  Consider spinal MRI is warranted  MRI brain without any evidence of intracranial metastatic disease.   10/09/2021 - 02/11/2022 Chemotherapy   Taxotere, Herceptin, Perjeta x 6   12/04/2021 Imaging   There is no evidence new metastatic disease. There is interval decrease in the left pleural effusion possibly suggesting resolving pleural metastatic disease. Few scattered sclerotic metastatic lesions in the skeletal structures have not changed significantly.   No acute findings are seen in the chest abdomen and pelvis.     02/22/2022 -  Antibody Plan   Herceptin/Perjeta every 3 weeks   03/21/2022 Imaging   CT chest abdomen pelvis  IMPRESSION: 1. No significant change since previous study. Again demonstrated is a chronic small left pleural effusion with basilar  atelectasis and scattered sclerotic skeletal metastases. 2. No acute abnormalities are  demonstrated. 3. Small esophageal hiatal hernia.     Electronically Signed   By: Sherry Barry M.D.   On: 03/21/2022 20:23     CURRENT THERAPY:  Herceptin/Perjeta; tamoxifen, Zoladex, Zometa  INTERVAL HISTORY: Sherry Barry 52 y.o. female returns for follow-up and evaluation prior to receiving Herceptin Perjeta.  He is taking tamoxifen daily and tolerates this well also.    She notes some intermittent pain in her left hip that began about 2 months ago.  She denies any precipitating event and says that she will take Tylenol if needed which helps some.  Notes the pain is worse with exercise or when she is walking up the steps.  Her most recent echocardiogram occurred on July 06, 2022 and was normal.  She is following with Dr. Aundra Barry for her echocardiograms and is scheduled to see him back in April for follow-up and repeat echo.  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  MEDICAL HISTORY: Past Medical History:  Diagnosis Date   Allergy    Breast cancer ([facility])    Breast cancer ([facility])    right   History of chemotherapy    doxetaxel/carboplatin/trastuzumab   History of migraine    last one about a week ago   Hx of radiation therapy 01/01/13- 02/15/13   r chest wall, r supraclav/axilla 5040 cGy/28  sessions, scar boost 1000 cGy/5 sessions   Hypertension    no meds,   urgent care on pomana   Migraine    Migraine     SURGICAL HISTORY: Past Surgical History:  Procedure Laterality Date   ABDOMINAL HYSTERECTOMY     no salpingo-oophorectomy 2009   BREAST RECONSTRUCTION WITH PLACEMENT OF TISSUE EXPANDER AND FLEX HD (ACELLULAR HYDRATED DERMIS) Left 09/24/2013   IR IMAGING GUIDED PORT INSERTION  10/07/2021   IR THORACENTESIS ASP PLEURAL SPACE W/IMG GUIDE  08/11/2021   IR THORACENTESIS ASP PLEURAL SPACE W/IMG GUIDE  10/07/2021   LATISSIMUS FLAP TO BREAST Right 09/24/2013   Procedure: RIGHT LATISSMUS MYOCUTAEIOUS MUSCLE FLAP AND PLACEMENT OF TISSUE Sherry Barry;  Surgeon: Sherry Kos, DO;  Location: [facility];  Service: Plastics;  Laterality: Right;   LIPOSUCTION WITH LIPOFILLING Bilateral 12/12/2013   Procedure: LIPOSUCTION WITH LIPOFILLING;  Surgeon: Sherry Kos, DO;  Location: [facility];  Service: Plastics;  Laterality: Bilateral;   PORT-A-CATH REMOVAL Left 12/12/2013   Procedure: REMOVAL PORT-A-CATH;  Surgeon: Sherry Kos, DO;  Location: [facility];  Service: Plastics;  Laterality: Left;   PORTACATH PLACEMENT  07/14/2012   Procedure: INSERTION PORT-A-CATH;  Surgeon: Sherry Faster. Cornett, MD;  Location: [facility];  Service: General;  Laterality: Left;   RECONSTRUCTION BREAST W/ LATISSIMUS DORSI FLAP Right 09/24/2013   "& tissue expander placement"   REMOVAL OF BILATERAL TISSUE EXPANDERS WITH PLACEMENT OF BILATERAL BREAST IMPLANTS Bilateral 12/12/2013   Procedure:  REMOVAL OF BILATERAL TISSUE EXPANDERS WITH PLACEMENT OF BILATERAL BREAST IMPLANTS/BILATERAL CAPSULECTOMIES WITH  LIPOFILLING FAT GRAFTING;  Surgeon: Sherry Kos, DO;  Location: [facility];  Service: Plastics;  Laterality: Bilateral;   SIMPLE MASTECTOMY WITH AXILLARY SENTINEL NODE BIOPSY  07/14/2012   Procedure: SIMPLE MASTECTOMY WITH AXILLARY SENTINEL NODE BIOPSY;  Surgeon: Sherry Faster. Cornett, MD;   Location: [facility];  Service: General;  Laterality: Right;  Bilateral simple mastectomy with port and right sebtibel lymph node mapping   SIMPLE MASTECTOMY WITH AXILLARY SENTINEL NODE BIOPSY  07/14/2012   Procedure: SIMPLE MASTECTOMY;  Surgeon: Sherry Faster. Cornett, MD;  Location: [facility];  Service: General;  Laterality: Left;   TISSUE EXPANDER PLACEMENT Left 09/24/2013   Procedure: PLACEMENT OF TISSUE EXPANDER AND FLEX HD TO LEFT BREAST;  Surgeon: Sherry Kos, DO;  Location: [facility];  Service: Plastics;  Laterality: Left;    SOCIAL HISTORY: Social History   Socioeconomic History   Marital status: Married    Spouse name: Not on file   Number of children: 2   Years of education: Not on file   Highest education level: Not on file  Occupational History    Employer: [employer]  Tobacco Use   Smoking status: Never   Smokeless tobacco: Never  Substance and Sexual Activity   Alcohol use: Yes    Comment: occasional   Drug use: No   Sexual activity: Yes    Birth control/protection: Surgical  Other Topics Concern   Not on file  Social History Narrative   Not on file   Social Determinants of Health   Financial Resource Strain: Not on file  Food Insecurity: Not on file  Transportation Needs: Not on file  Physical Activity: Not on file  Stress: Not on file  Social Connections: Not on file  Intimate Partner Violence: Not on file    FAMILY HISTORY: Family History  Problem Relation Age of Onset   Hypertension Mother    Alcohol abuse Mother    Heart disease Maternal Grandmother    Stroke Maternal Grandfather    Colon cancer Maternal Aunt 37       alive at 30   Brain cancer Maternal Uncle 48       and lymphoma in early 31s; deceased   Brain cancer Maternal Uncle 60       deceased   Pancreatic cancer Maternal Uncle 81       alive at 17   Breast cancer Cousin 68       mat 1st cousin once removed through mat GF ; deceased   Breast cancer Maternal Aunt        great aunt through mat  GF; dx at ? age    Review of Systems  Constitutional:  Negative for appetite change, chills, fatigue, fever and unexpected weight change.  HENT:   Negative for hearing loss, lump/mass and trouble swallowing.   Eyes:  Negative for eye problems and icterus.  Respiratory:  Negative for chest tightness, cough and shortness of breath.   Cardiovascular:  Negative for chest pain, leg swelling and palpitations.  Gastrointestinal:  Negative for abdominal distention, abdominal pain, constipation, diarrhea, nausea and vomiting.  Endocrine: Negative for hot flashes.  Genitourinary:  Negative for difficulty urinating.   Musculoskeletal:  Negative for arthralgias.  Skin:  Negative for itching and rash.  Neurological:  Negative for dizziness, extremity weakness, headaches and numbness.  Hematological:  Negative for adenopathy. Does not bruise/bleed easily.  Psychiatric/Behavioral:  Negative for  depression. The patient is not nervous/anxious.       PHYSICAL EXAMINATION  ECOG PERFORMANCE STATUS: 1 - Symptomatic but completely ambulatory  Vitals:   08/20/22 1341  BP: 123/89  Pulse: 91  Resp: 16  Temp: 97.7 F (36.5 C)  SpO2: 97%    Physical Exam Constitutional:      General: She is not in acute distress.    Appearance: Normal appearance. She is not toxic-appearing.  HENT:     Head: Normocephalic and atraumatic.  Eyes:     General: No scleral icterus. Cardiovascular:     Rate and Rhythm: Normal rate and regular rhythm.     Pulses: Normal pulses.     Heart sounds: Normal heart sounds.  Pulmonary:     Effort: Pulmonary effort is normal.     Breath sounds: Normal breath sounds.  Abdominal:     General: Abdomen is flat. Bowel sounds are normal. There is no distension.     Palpations: Abdomen is soft.     Tenderness: There is no abdominal tenderness.  Musculoskeletal:        General: Tenderness (+ TTP to left greater trochanter) present. No swelling.     Cervical back: Neck supple.   Lymphadenopathy:     Cervical: No cervical adenopathy.  Skin:    General: Skin is warm and dry.     Findings: No rash.  Neurological:     General: No focal deficit present.     Mental Status: She is alert.  Psychiatric:        Mood and Affect: Mood normal.        Behavior: Behavior normal.     LABORATORY DATA:  CBC    Component Value Date/Time   WBC 5.7 08/20/2022 1322   RBC 4.66 08/20/2022 1322   HGB 13.8 08/20/2022 1322   HGB 13.4 03/26/2022 1329   HGB 15.9 10/30/2019 1030   HGB 14.7 02/15/2017 0852   HCT 39.7 08/20/2022 1322   HCT 46.1 10/30/2019 1030   HCT 42.9 02/15/2017 0852   PLT 228 08/20/2022 1322   PLT 261 03/26/2022 1329   PLT 245 10/30/2019 1030   MCV 85.2 08/20/2022 1322   MCV 88 10/30/2019 1030   MCV 86.7 02/15/2017 0852   MCH 29.6 08/20/2022 1322   MCHC 34.8 08/20/2022 1322   RDW 13.2 08/20/2022 1322   RDW 12.5 10/30/2019 1030   RDW 12.6 02/15/2017 0852   LYMPHSABS 1.7 08/20/2022 1322   LYMPHSABS 1.4 02/15/2017 0852   MONOABS 0.4 08/20/2022 1322   MONOABS 0.3 02/15/2017 0852   EOSABS 0.1 08/20/2022 1322   EOSABS 0.1 02/15/2017 0852   BASOSABS 0.0 08/20/2022 1322   BASOSABS 0.0 02/15/2017 0852    CMP     Component Value Date/Time   NA 142 08/20/2022 1322   NA 140 10/30/2019 1030   NA 141 02/15/2017 0852   K 3.5 08/20/2022 1322   K 3.9 02/15/2017 0852   CL 107 08/20/2022 1322   CL 101 11/15/2012 0904   CO2 27 08/20/2022 1322   CO2 25 02/15/2017 0852   GLUCOSE 86 08/20/2022 1322   GLUCOSE 87 02/15/2017 0852   GLUCOSE 69 (L) 11/15/2012 0904   BUN 11 08/20/2022 1322   BUN 11 10/30/2019 1030   BUN 12.5 02/15/2017 0852   CREATININE 0.79 08/20/2022 1322   CREATININE 0.66 03/26/2022 1329   CREATININE 0.9 02/15/2017 0852   CALCIUM 9.0 08/20/2022 1322   CALCIUM 9.3 02/15/2017 0852   PROT 6.9  08/20/2022 1322   PROT 6.7 10/30/2019 1030   PROT 7.0 02/15/2017 0852   ALBUMIN 4.3 08/20/2022 1322   ALBUMIN 4.2 10/30/2019 1030   ALBUMIN 3.6  02/15/2017 0852   AST 16 08/20/2022 1322   AST 15 03/26/2022 1329   AST 16 02/15/2017 0852   ALT 11 08/20/2022 1322   ALT 9 03/26/2022 1329   ALT 13 02/15/2017 0852   ALKPHOS 52 08/20/2022 1322   ALKPHOS 63 02/15/2017 0852   BILITOT 0.4 08/20/2022 1322   BILITOT 0.4 03/26/2022 1329   BILITOT 0.69 02/15/2017 0852   GFRNONAA >60 08/20/2022 1322   GFRNONAA >60 03/26/2022 1329   GFRAA 100 10/30/2019 1030      ASSESSMENT and THERAPY PLAN:   Malignant neoplasm of upper-inner quadrant of right breast in female, estrogen receptor positive ([place]) Lakea is a 52 year old woman with stage IV triple positive breast cancer currently on treatment with Herceptin Perjeta, and tamoxifen.  She is tolerating treatment well and will proceed with Herceptin Perjeta today.  She will also continue on tamoxifen daily.  In regard to her left hip we will obtain x-rays of the area.  Considering her physical exam she appears to have left greater trochanteric bursitis.  I recommended she take Aleve twice daily for a few days to see if that helps.  If the x-ray is negative for any cancerous lesion and if the Aleve does not work we can always refer to sports medicine for evaluation of an injection to the area.  Sherry Barry will receive the Zometa in addition to Zoladex today.  She tolerates these well.  We will see Sherry Barry back every 3 weeks for Herceptin Perjeta.   All questions were answered. The patient knows to call the clinic with any problems, questions or concerns. We can certainly see the patient much sooner if necessary.  Total encounter time:30 minutes*in face-to-face visit time, chart review, lab review, care coordination, order entry, and documentation of the encounter time.    Sherry Bihari, NP 08/20/22 4:04 PM Medical Oncology and Hematology [facility] [place], [place] [address] Tel. [phone]    Fax. [phone]  *Total Encounter Time as defined by the Centers for  Medicare and Medicaid Services includes, in addition to the face-to-face time of a patient visit (documented in the note above) non-face-to-face time: obtaining and reviewing outside history, ordering and reviewing medications, tests or procedures, care coordination (communications with other health care professionals or caregivers) and documentation in the medical record.

## 2022-08-20 NOTE — Patient Instructions (Addendum)
[facility]  Discharge Instructions: Thank you for choosing [facility] to provide your oncology and hematology care.   If you have a lab appointment with the [facility], please go directly to the [facility] and check in at the registration area.   Wear comfortable clothing and clothing appropriate for easy access to any Portacath or PICC line.   We strive to give you quality time with your provider. You may need to reschedule your appointment if you arrive late (15 or more minutes).  Arriving late affects you and other patients whose appointments are after yours.  Also, if you miss three or more appointments without notifying the office, you may be dismissed from the clinic at the provider's discretion.      For prescription refill requests, have your pharmacy contact our office and allow 72 hours for refills to be completed.    Today you received the following chemotherapy and/or immunotherapy agents: trastuzumab (ogivri) & pertuzumab (perjeta)      To help prevent nausea and vomiting after your treatment, we encourage you to take your nausea medication as directed.  BELOW ARE SYMPTOMS THAT SHOULD BE REPORTED IMMEDIATELY: *FEVER GREATER THAN 100.4 F (38 C) OR HIGHER *CHILLS OR SWEATING *NAUSEA AND VOMITING THAT IS NOT CONTROLLED WITH YOUR NAUSEA MEDICATION *UNUSUAL SHORTNESS OF BREATH *UNUSUAL BRUISING OR BLEEDING *URINARY PROBLEMS (pain or burning when urinating, or frequent urination) *BOWEL PROBLEMS (unusual diarrhea, constipation, pain near the anus) TENDERNESS IN MOUTH AND THROAT WITH OR WITHOUT PRESENCE OF ULCERS (sore throat, sores in mouth, or a toothache) UNUSUAL RASH, SWELLING OR PAIN  UNUSUAL VAGINAL DISCHARGE OR ITCHING   Items with * indicate a potential emergency and should be followed up as soon as possible or go to the Emergency Department if any problems should occur.  Please show the CHEMOTHERAPY ALERT CARD or  IMMUNOTHERAPY ALERT CARD at check-in to the Emergency Department and triage nurse.  Should you have questions after your visit or need to cancel or reschedule your appointment, please contact [facility]  Dept: [phone]  and follow the prompts.  Office hours are 8:00 a.m. to 4:30 p.m. Monday - Friday. Please note that voicemails left after 4:00 p.m. may not be returned until the following business day.  We are closed weekends and major holidays. You have access to a nurse at all times for urgent questions. Please call the main number to the clinic Dept: [phone] and follow the prompts.   For any non-urgent questions, you may also contact your provider using MyChart. We now offer e-Visits for anyone 30 and older to request care online for non-urgent symptoms. For details visit [email or web address].   Also download the MyChart app! Go to the app store, search "MyChart", open the app, select [facility], and log in with your MyChart username and password.  Zoledronic Acid Injection (Bone Disorders) What is this medication? ZOLEDRONIC ACID (ZOE le dron ik AS id) prevents and treats osteoporosis. It may also be used to treat Paget's disease of the bone. It works by Paramedic stronger and less likely to break (fracture). It belongs to a group of medications called bisphosphonates. This medicine may be used for other purposes; ask your health care provider or pharmacist if you have questions. COMMON BRAND NAME(S): Reclast What should I tell my care team before I take this medication? They need to know if you have any of these conditions: Bleeding  disorder Cancer Dental disease Kidney disease Low levels of calcium in the blood Low red blood cell counts Lung or breathing disease, such as asthma Receiving steroids, such as dexamethasone or prednisone An unusual or allergic reaction to zoledronic acid, other medications, foods, dyes, or  preservatives Pregnant or trying to get pregnant Breast-feeding How should I use this medication? This medication is injected into a vein. It is given by your care team in a hospital or clinic setting. A special MedGuide will be given to you before each treatment. Be sure to read this information carefully each time. Talk to your care team about the use of this medication in children. Special care may be needed. Overdosage: If you think you have taken too much of this medicine contact a poison control center or emergency room at once. NOTE: This medicine is only for you. Do not share this medicine with others. What if I miss a dose? Keep appointments for follow-up doses. It is important not to miss your dose. Call your care team if you are unable to keep an appointment. What may interact with this medication? Certain antibiotics given by injection Medications for pain and inflammation, such as ibuprofen, naproxen, NSAIDs Some diuretics, such as bumetanide, furosemide Teriparatide This list may not describe all possible interactions. Give your health care provider a list of all the medicines, herbs, non-prescription drugs, or dietary supplements you use. Also tell them if you smoke, drink alcohol, or use illegal drugs. Some items may interact with your medicine. What should I watch for while using this medication? Visit your care team for regular checks on your progress. It may be some time before you see the benefit from this medication. Some people who take this medication have severe bone, joint, or muscle pain. This medication may also increase your risk for jaw problems or a broken thigh bone. Tell your care team right away if you have severe pain in your jaw, bones, joints, or muscles. Tell your care team if you have any pain that does not go away or that gets worse. You should make sure you get enough calcium and vitamin D while you are taking this medication. Discuss the foods you eat and  the vitamins you take with your care team. You may need bloodwork while taking this medication. Tell your dentist and dental surgeon that you are taking this medication. You should not have major dental surgery while on this medication. See your dentist to have a dental exam and fix any dental problems before starting this medication. Take good care of your teeth while on this medication. Make sure you see your dentist for regular follow-up appointments. What side effects may I notice from receiving this medication? Side effects that you should report to your care team as soon as possible: Allergic reactions--skin rash, itching, hives, swelling of the face, lips, tongue, or throat Kidney injury--decrease in the amount of urine, swelling of the ankles, hands, or feet Low calcium level--muscle pain or cramps, confusion, tingling, or numbness in the hands or feet Osteonecrosis of the jaw--pain, swelling, or redness in the mouth, numbness of the jaw, poor healing after dental work, unusual discharge from the mouth, visible bones in the mouth Severe bone, joint, or muscle pain Side effects that usually do not require medical attention (report to your care team if they continue or are bothersome): Diarrhea Dizziness Headache Nausea Stomach pain Vomiting This list may not describe all possible side effects. Call your doctor for medical advice about side  effects. You may report side effects to FDA at 1-800-FDA-1088. Where should I keep my medication? This medication is given in a hospital or clinic. It will not be stored at home. NOTE: This sheet is a summary. It may not cover all possible information. If you have questions about this medicine, talk to your doctor, pharmacist, or health care provider.  2023 Elsevier/Gold Standard (2021-07-17 00:00:00)  Goserelin Implant What is this medication? GOSERELIN (GOE se rel in) treats prostate cancer and breast cancer. It works by decreasing levels of the  hormones testosterone and estrogen in the body. This prevents prostate and breast cancer cells from spreading or growing. It may also be used to treat endometriosis. This is a condition where the tissue that lines the uterus grows outside the uterus. It works by decreasing the amount of estrogen your body makes, which reduces heavy bleeding and pain. It can also be used to help thin the lining of the uterus before a surgery used to prevent or reduce heavy periods. This medicine may be used for other purposes; ask your health care provider or pharmacist if you have questions. COMMON BRAND NAME(S): Zoladex, Zoladex 69-Month What should I tell my care team before I take this medication? They need to know if you have any of these conditions: Bone problems Diabetes Heart disease History of irregular heartbeat or rhythm An unusual or allergic reaction to goserelin, other medications, foods, dyes, or preservatives Pregnant or trying to get pregnant Breastfeeding How should I use this medication? This medication is injected under the skin. It is given by your care team in a hospital or clinic setting. Talk to your care team about the use of this medication in children. Special care may be needed. Overdosage: If you think you have taken too much of this medicine contact a poison control center or emergency room at once. NOTE: This medicine is only for you. Do not share this medicine with others. What if I miss a dose? Keep appointments for follow-up doses. It is important not to miss your dose. Call your care team if you are unable to keep an appointment. What may interact with this medication? Do not take this medication with any of the following: Cisapride Dronedarone Pimozide Thioridazine This medication may also interact with the following: Other medications that cause heart rhythm changes This list may not describe all possible interactions. Give your health care provider a list of all the  medicines, herbs, non-prescription drugs, or dietary supplements you use. Also tell them if you smoke, drink alcohol, or use illegal drugs. Some items may interact with your medicine. What should I watch for while using this medication? Visit your care team for regular checks on your progress. Your symptoms may appear to get worse during the first weeks of this therapy. Tell your care team if your symptoms do not start to get better or if they get worse after this time. Using this medication for a long time may weaken your bones. If you smoke or frequently drink alcohol you may increase your risk of bone loss. A family history of osteoporosis, chronic use of medications for seizures (convulsions), or corticosteroids can also increase your risk of bone loss. The risk of bone fractures may be increased. Talk to your care team about your bone health. This medication may increase blood sugar. The risk may be higher in patients who already have diabetes. Ask your care team what you can do to lower your risk of diabetes while taking this medication.  This medication should stop regular monthly menstruation in women. Tell your care team if you continue to menstruate. Talk to your care team if you wish to become pregnant or think you might be pregnant. This medication can cause serious birth defects if taken during pregnancy or for 12 weeks after stopping treatment. Talk to your care team about reliable forms of contraception. Do not breastfeed while taking this medication. This medication may cause infertility. Talk to your care team if you are concerned about your fertility. What side effects may I notice from receiving this medication? Side effects that you should report to your care team as soon as possible: Allergic reactions--skin rash, itching, hives, swelling of the face, lips, tongue, or throat Change in the amount of urine Heart attack--pain or tightness in the chest, shoulders, arms, or jaw, nausea,  shortness of breath, cold or clammy skin, feeling faint or lightheaded Heart rhythm changes--fast or irregular heartbeat, dizziness, feeling faint or lightheaded, chest pain, trouble breathing High blood sugar (hyperglycemia)--increased thirst or amount of urine, unusual weakness or fatigue, blurry vision High calcium level--increased thirst or amount of urine, nausea, vomiting, confusion, unusual weakness or fatigue, bone pain Pain, redness, irritation, or bruising at the injection site Severe back pain, numbness or weakness of the hands, arms, legs, or feet, loss of coordination, loss of bowel or bladder control Stroke--sudden numbness or weakness of the face, arm, or leg, trouble speaking, confusion, trouble walking, loss of balance or coordination, dizziness, severe headache, change in vision Swelling and pain of the tumor site or lymph nodes Trouble passing urine Side effects that usually do not require medical attention (report to your care team if they continue or are bothersome): Change in sex drive or performance Headache Hot flashes Rapid or extreme change in emotion or mood Sweating Swelling of the ankles, hands, or feet Unusual vaginal discharge, itching, or odor This list may not describe all possible side effects. Call your doctor for medical advice about side effects. You may report side effects to FDA at 1-800-FDA-1088. Where should I keep my medication? This medication is given in a hospital or clinic. It will not be stored at home. NOTE: This sheet is a summary. It may not cover all possible information. If you have questions about this medicine, talk to your doctor, pharmacist, or health care provider.  2023 Elsevier/Gold Standard (2021-10-14 00:00:00)

## 2022-08-20 NOTE — Assessment & Plan Note (Signed)
Sherry Barry is a 52 year old woman with stage IV triple positive breast cancer currently on treatment with Herceptin Perjeta, and tamoxifen.  She is tolerating treatment well and will proceed with Herceptin Perjeta today.  She will also continue on tamoxifen daily.  In regard to her left hip we will obtain x-rays of the area.  Considering her physical exam she appears to have left greater trochanteric bursitis.  I recommended she take Aleve twice daily for a few days to see if that helps.  If the x-ray is negative for any cancerous lesion and if the Aleve does not work we can always refer to sports medicine for evaluation of an injection to the area.  Sherry Barry will receive the Zometa in addition to Zoladex today.  She tolerates these well.  We will see Sherry Barry back every 3 weeks for Herceptin Perjeta.

## 2022-08-20 NOTE — Progress Notes (Signed)
Patient calcium of 9.0 (8.76 corrected) was discussed with Kennith Center, RPH and Wilber Bihari, NP- ok to give Zometa. Kidney function is good and patient states she has dental clearance.

## 2022-08-27 ENCOUNTER — Ambulatory Visit (HOSPITAL_COMMUNITY)
Admission: RE | Admit: 2022-08-27 | Discharge: 2022-08-27 | Disposition: A | Discharge status: Home / Self Care | Payer: Federal, State, Local not specified - PPO | Source: Ambulatory Visit | Attending: Adult Health | Admitting: Adult Health

## 2022-08-27 ENCOUNTER — Other Ambulatory Visit (HOSPITAL_COMMUNITY): Payer: Self-pay | Admitting: Hematology and Oncology

## 2022-08-27 DIAGNOSIS — Z170 Estrogen receptor positive status [ER+]: Secondary | ICD-10-CM | POA: Diagnosis not present

## 2022-08-27 DIAGNOSIS — M25552 Pain in left hip: Secondary | ICD-10-CM

## 2022-08-27 DIAGNOSIS — C50211 Malignant neoplasm of upper-inner quadrant of right female breast: Secondary | ICD-10-CM | POA: Diagnosis not present

## 2022-08-30 ENCOUNTER — Telehealth: Payer: Self-pay

## 2022-08-30 NOTE — Telephone Encounter (Signed)
Called pt to make her aware of results per NP. She verbalized thanks and understanding.

## 2022-08-30 NOTE — Telephone Encounter (Signed)
-----   Message from Gardenia Phlegm, NP sent at 08/30/2022  3:59 PM EDT ----- Hip shows no fracture.  Possibly early degeneration, please let patient know.  ----- Message ----- From: Interface, Rad Results In Sent: 08/30/2022   1:38 PM EDT To: Gardenia Phlegm, NP

## 2022-09-09 ENCOUNTER — Telehealth: Payer: Self-pay | Admitting: Hematology and Oncology

## 2022-09-09 ENCOUNTER — Other Ambulatory Visit: Payer: Self-pay

## 2022-09-09 DIAGNOSIS — Z170 Estrogen receptor positive status [ER+]: Secondary | ICD-10-CM

## 2022-09-09 NOTE — Telephone Encounter (Signed)
Contacted patient to scheduled appointments. Patient is aware of appointments that are scheduled.   

## 2022-09-10 ENCOUNTER — Inpatient Hospital Stay: Payer: Federal, State, Local not specified - PPO

## 2022-09-10 VITALS — BP 128/78 | HR 88 | Temp 98.2°F | Resp 18 | Wt 138.8 lb

## 2022-09-10 DIAGNOSIS — Z9013 Acquired absence of bilateral breasts and nipples: Secondary | ICD-10-CM | POA: Diagnosis not present

## 2022-09-10 DIAGNOSIS — Z170 Estrogen receptor positive status [ER+]: Secondary | ICD-10-CM

## 2022-09-10 DIAGNOSIS — Z79899 Other long term (current) drug therapy: Secondary | ICD-10-CM | POA: Diagnosis not present

## 2022-09-10 DIAGNOSIS — Z79810 Long term (current) use of selective estrogen receptor modulators (SERMs): Secondary | ICD-10-CM | POA: Diagnosis not present

## 2022-09-10 DIAGNOSIS — Z5112 Encounter for antineoplastic immunotherapy: Secondary | ICD-10-CM | POA: Diagnosis not present

## 2022-09-10 DIAGNOSIS — C50211 Malignant neoplasm of upper-inner quadrant of right female breast: Secondary | ICD-10-CM | POA: Diagnosis not present

## 2022-09-10 DIAGNOSIS — C7951 Secondary malignant neoplasm of bone: Secondary | ICD-10-CM | POA: Diagnosis not present

## 2022-09-10 DIAGNOSIS — M7062 Trochanteric bursitis, left hip: Secondary | ICD-10-CM | POA: Diagnosis not present

## 2022-09-10 DIAGNOSIS — I10 Essential (primary) hypertension: Secondary | ICD-10-CM | POA: Diagnosis not present

## 2022-09-10 DIAGNOSIS — Z9221 Personal history of antineoplastic chemotherapy: Secondary | ICD-10-CM | POA: Diagnosis not present

## 2022-09-10 DIAGNOSIS — Z923 Personal history of irradiation: Secondary | ICD-10-CM | POA: Diagnosis not present

## 2022-09-10 LAB — CMP (CANCER CENTER ONLY)
ALT: 10 U/L (ref 0–44)
AST: 15 U/L (ref 15–41)
Albumin: 4.2 g/dL (ref 3.5–5.0)
Alkaline Phosphatase: 53 U/L (ref 38–126)
Anion gap: 6 (ref 5–15)
BUN: 13 mg/dL (ref 6–20)
CO2: 29 mmol/L (ref 22–32)
Calcium: 9.1 mg/dL (ref 8.9–10.3)
Chloride: 109 mmol/L (ref 98–111)
Creatinine: 0.68 mg/dL (ref 0.44–1.00)
GFR, Estimated: >60 mL/min (ref >60)
Glucose, Bld: 85 mg/dL (ref 70–99)
Potassium: 3.6 mmol/L (ref 3.5–5.1)
Sodium: 144 mmol/L (ref 135–145)
Total Bilirubin: 0.5 mg/dL (ref 0.3–1.2)
Total Protein: 6.9 g/dL (ref 6.5–8.1)

## 2022-09-10 LAB — CBC WITH DIFFERENTIAL (CANCER CENTER ONLY)
Abs Immature Granulocytes: 0.01 10*3/uL (ref 0.00–0.07)
Basophils Absolute: 0 10*3/uL (ref 0.0–0.1)
Basophils Relative: 1 %
Eosinophils Absolute: 0.1 10*3/uL (ref 0.0–0.5)
Eosinophils Relative: 3 %
HCT: 40.7 % (ref 36.0–46.0)
Hemoglobin: 13.9 g/dL (ref 12.0–15.0)
Immature Granulocytes: 0 %
Lymphocytes Relative: 27 %
Lymphs Abs: 1.4 10*3/uL (ref 0.7–4.0)
MCH: 29.1 pg (ref 26.0–34.0)
MCHC: 34.2 g/dL (ref 30.0–36.0)
MCV: 85.3 fL (ref 80.0–100.0)
Monocytes Absolute: 0.4 10*3/uL (ref 0.1–1.0)
Monocytes Relative: 8 %
Neutro Abs: 3.2 10*3/uL (ref 1.7–7.7)
Neutrophils Relative %: 61 %
Platelet Count: 240 10*3/uL (ref 150–400)
RBC: 4.77 MIL/uL (ref 3.87–5.11)
RDW: 12.5 % (ref 11.5–15.5)
WBC Count: 5.2 10*3/uL (ref 4.0–10.5)
nRBC: 0 % (ref 0.0–0.2)

## 2022-09-10 MED ORDER — SODIUM CHLORIDE 0.9% FLUSH
10.0000 mL | Freq: Once | INTRAVENOUS | Status: AC
  Administered 2022-09-10: 10 mL

## 2022-09-10 MED ORDER — ACETAMINOPHEN 325 MG PO TABS
650.0000 mg | ORAL_TABLET | Freq: Once | ORAL | Status: AC
  Administered 2022-09-10: 650 mg via ORAL
  Filled 2022-09-10: qty 2

## 2022-09-10 MED ORDER — SODIUM CHLORIDE 0.9 % IV SOLN
420.0000 mg | Freq: Once | INTRAVENOUS | Status: AC
  Administered 2022-09-10: 420 mg via INTRAVENOUS
  Filled 2022-09-10: qty 14

## 2022-09-10 MED ORDER — SODIUM CHLORIDE 0.9 % IV SOLN: Freq: Once | INTRAVENOUS | Status: AC

## 2022-09-10 MED ORDER — TRASTUZUMAB-DKST CHEMO 150 MG IV SOLR
6.0000 mg/kg | Freq: Once | INTRAVENOUS | Status: AC
  Administered 2022-09-10: 420 mg via INTRAVENOUS
  Filled 2022-09-10: qty 20

## 2022-09-10 MED ORDER — DIPHENHYDRAMINE HCL 25 MG PO CAPS
25.0000 mg | ORAL_CAPSULE | Freq: Once | ORAL | Status: AC
  Administered 2022-09-10: 25 mg via ORAL
  Filled 2022-09-10: qty 1

## 2022-09-13 ENCOUNTER — Emergency Department (HOSPITAL_COMMUNITY)
Admission: EM | Admit: 2022-09-13 | Discharge: 2022-09-13 | Disposition: A | Discharge status: Home / Self Care | Payer: Federal, State, Local not specified - PPO | Attending: Emergency Medicine | Admitting: Emergency Medicine

## 2022-09-13 ENCOUNTER — Encounter (HOSPITAL_COMMUNITY): Payer: Self-pay

## 2022-09-13 ENCOUNTER — Emergency Department (HOSPITAL_COMMUNITY): Payer: Federal, State, Local not specified - PPO

## 2022-09-13 DIAGNOSIS — Z91040 Latex allergy status: Secondary | ICD-10-CM | POA: Diagnosis not present

## 2022-09-13 DIAGNOSIS — M542 Cervicalgia: Secondary | ICD-10-CM | POA: Diagnosis not present

## 2022-09-13 LAB — CBC WITH DIFFERENTIAL/PLATELET
Abs Immature Granulocytes: 0.01 10*3/uL (ref 0.00–0.07)
Basophils Absolute: 0 10*3/uL (ref 0.0–0.1)
Basophils Relative: 0 %
Eosinophils Absolute: 0.1 10*3/uL (ref 0.0–0.5)
Eosinophils Relative: 2 %
HCT: 43 % (ref 36.0–46.0)
Hemoglobin: 14.7 g/dL (ref 12.0–15.0)
Immature Granulocytes: 0 %
Lymphocytes Relative: 29 %
Lymphs Abs: 1.6 10*3/uL (ref 0.7–4.0)
MCH: 29.6 pg (ref 26.0–34.0)
MCHC: 34.2 g/dL (ref 30.0–36.0)
MCV: 86.5 fL (ref 80.0–100.0)
Monocytes Absolute: 0.4 10*3/uL (ref 0.1–1.0)
Monocytes Relative: 7 %
Neutro Abs: 3.3 10*3/uL (ref 1.7–7.7)
Neutrophils Relative %: 62 %
Platelets: 249 10*3/uL (ref 150–400)
RBC: 4.97 MIL/uL (ref 3.87–5.11)
RDW: 12.7 % (ref 11.5–15.5)
WBC: 5.4 10*3/uL (ref 4.0–10.5)
nRBC: 0 % (ref 0.0–0.2)

## 2022-09-13 LAB — BASIC METABOLIC PANEL
Anion gap: 10 (ref 5–15)
BUN: 11 mg/dL (ref 6–20)
CO2: 23 mmol/L (ref 22–32)
Calcium: 9 mg/dL (ref 8.9–10.3)
Chloride: 109 mmol/L (ref 98–111)
Creatinine, Ser: 0.57 mg/dL (ref 0.44–1.00)
GFR, Estimated: >60 mL/min (ref >60)
Glucose, Bld: 118 mg/dL — ABNORMAL HIGH (ref 70–99)
Potassium: 2.9 mmol/L — ABNORMAL LOW (ref 3.5–5.1)
Sodium: 142 mmol/L (ref 135–145)

## 2022-09-13 MED ORDER — KETOROLAC TROMETHAMINE 15 MG/ML IJ SOLN
15.0000 mg | Freq: Once | INTRAMUSCULAR | Status: AC
  Administered 2022-09-13: 15 mg via INTRAVENOUS
  Filled 2022-09-13: qty 1

## 2022-09-13 MED ORDER — IOHEXOL 350 MG/ML SOLN
75.0000 mL | Freq: Once | INTRAVENOUS | Status: AC | PRN
  Administered 2022-09-13: 75 mL via INTRAVENOUS

## 2022-09-13 MED ORDER — LIDOCAINE 5 % EX PTCH
1.0000 | MEDICATED_PATCH | CUTANEOUS | Status: DC
  Administered 2022-09-13: 1 via TRANSDERMAL
  Filled 2022-09-13: qty 1

## 2022-09-13 MED ORDER — LIDOCAINE 5 % EX PTCH: 1.0000 | MEDICATED_PATCH | CUTANEOUS | 0 refills | Status: DC

## 2022-09-13 NOTE — ED Notes (Signed)
Patient transported to CT 

## 2022-09-13 NOTE — ED Triage Notes (Signed)
Pt c/o left sided neck pain. Pt denies injury/trauma. She did however do yard work this weekend.

## 2022-09-13 NOTE — ED Provider Triage Note (Signed)
Emergency Medicine Provider Triage Evaluation Note  Adlena Anker , a 52 y.o. female  was evaluated in triage.  Pt complains of neck pain. Hx of stage IV breast CA.  Report atraumatic pain to L side of neck since yesterday.  No fever, chills, headache, cp, arm pain, numbness, weakness, stiffness.   Review of Systems  Positive: As above Negative: As above  Physical Exam  BP (!) 152/107 (BP Location: Left Arm)   Pulse (!) 107   Temp 98.6 F (37 C) (Oral)   Resp 16   Ht 5\' 2"  (1.575 m)   Wt 62.6 kg   SpO2 98%   BMI 25.24 kg/m  Gen:   Awake, no distress   Resp:  Normal effort  MSK:   Moves extremities without difficulty  Other:    Medical Decision Making  Medically screening exam initiated at 3:34 PM.  Appropriate orders placed.  Wendy Muskelly-Irvine was informed that the remainder of the evaluation will be completed by another provider, this initial triage assessment does not replace that evaluation, and the importance of remaining in the ED until their evaluation is complete.     Domenic Moras, PA-C 09/13/22 1535

## 2022-09-13 NOTE — ED Provider Notes (Signed)
[facility] EMERGENCY DEPARTMENT AT [facility] Provider Note   CSN: [id number] Arrival date & time: 09/13/22  1520     History  Chief Complaint  Patient presents with   Neck Pain    Sherry Barry is a 52 y.o. female.   Neck Pain    Patient presents the emergency room due to left-sided neck pain.  Started atraumatically yesterday, is worse with any movement.  Denies any vision changes, weakness, headaches, loss conscious.  Denies any trauma to the back of her neck.  Home Medications Prior to Admission medications   Medication Sig Start Date End Date Taking? Authorizing Provider  lidocaine (LIDODERM) 5 % Place 1 patch onto the skin daily. Remove & Discard patch within 12 hours or as directed by MD 09/13/22  Yes Sherrill Raring, PA-C  acetaminophen (TYLENOL) 500 MG tablet Take 1,000 mg by mouth every 6 (six) hours as needed for moderate pain.    [provider]  amLODipine (NORVASC) 10 MG tablet Take 1 tablet (10 mg total) by mouth every morning. 10/29/21   Gardenia Phlegm, NP  gabapentin (NEURONTIN) 300 MG capsule Take 1 capsule (300 mg total) by mouth at bedtime. 07/09/22   [name], MD  KLOR-CON M20 20 MEQ tablet TAKE 1 TABLET BY MOUTH EVERY DAY 03/23/22   [name], MD  lidocaine-prilocaine (EMLA) cream Apply 1 application. topically as needed. 10/09/21   [name], MD  LORazepam (ATIVAN) 0.5 MG tablet Take 1 tablet (0.5 mg total) by mouth every 6 (six) hours as needed for anxiety. 10/09/21   [name], MD  tacrolimus (PROTOPIC) 0.1 % ointment Apply 1 Application topically 2 (two) times daily. Patient not taking: Reported on 08/20/2022 08/16/22   [provider]  tamoxifen (NOLVADEX) 20 MG tablet Take 1 tablet (20 mg total) by mouth daily. 03/05/22   [name], MD  triamcinolone ointment (KENALOG) 0.1 % Apply topically 2 (two) times daily. 08/12/22   [provider]      Allergies    Codeine, Hydrocodone,  Latex, Lisinopril, and Tomato    Review of Systems   Review of Systems  Musculoskeletal:  Positive for neck pain.    Physical Exam Updated Vital Signs BP (!) 139/101   Pulse 80   Temp 98.2 F (36.8 C) (Oral)   Resp 17   Ht 5\' 2"  (1.575 m)   Wt 62.6 kg   SpO2 100%   BMI 25.24 kg/m  Physical Exam Vitals and nursing note reviewed. Exam conducted with a chaperone present.  Constitutional:      Appearance: Normal appearance.  HENT:     Head: Normocephalic and atraumatic.  Eyes:     General: No scleral icterus.       Right eye: No discharge.        Left eye: No discharge.     Extraocular Movements: Extraocular movements intact.     Pupils: Pupils are equal, round, and reactive to light.  Cardiovascular:     Rate and Rhythm: Normal rate and regular rhythm.     Pulses: Normal pulses.     Heart sounds: Normal heart sounds. No murmur heard.    No friction rub. No gallop.  Pulmonary:     Effort: Pulmonary effort is normal. No respiratory distress.     Breath sounds: Normal breath sounds.  Abdominal:     General: Abdomen is flat. Bowel sounds are normal. There is no distension.     Palpations: Abdomen is soft.  Tenderness: There is no abdominal tenderness.  Skin:    General: Skin is warm and dry.     Coloration: Skin is not jaundiced.  Neurological:     Mental Status: She is alert. Mental status is at baseline.     Coordination: Coordination normal.     Comments: Nerves II through XII gross intact right upper and lower extremity same symmetric bilaterally.     ED Results / Procedures / Treatments   Labs (all labs ordered are listed, but only abnormal results are displayed) Labs Reviewed  BASIC METABOLIC PANEL - Abnormal; Notable for the following components:      Result Value   Potassium 2.9 (*)    Glucose, Bld 118 (*)    All other components within normal limits  CBC WITH DIFFERENTIAL/PLATELET    EKG None  Radiology CT Angio Neck W and/or Wo  Contrast  Result Date: 09/13/2022 CLINICAL DATA:  Arterial injury suspected, left neck pain since yesterday, no history of trauma EXAM: CT ANGIOGRAPHY NECK TECHNIQUE: Multidetector CT imaging of the neck was performed using the standard protocol during bolus administration of intravenous contrast. Multiplanar CT image reconstructions and MIPs were obtained to evaluate the vascular anatomy. Carotid stenosis measurements (when applicable) are obtained utilizing NASCET criteria, using the distal internal carotid diameter as the denominator. RADIATION DOSE REDUCTION: This exam was performed according to the departmental dose-optimization program which includes automated exposure control, adjustment of the mA and/or kV according to patient size and/or use of iterative reconstruction technique. CONTRAST:  81mL OMNIPAQUE IOHEXOL 350 MG/ML SOLN COMPARISON:  None Available. FINDINGS: Aortic arch: Standard branching. Imaged portion shows no evidence of aneurysm or dissection. No significant stenosis of the major arch vessel origins. Right carotid system: No evidence of stenosis, dissection, or occlusion. Left carotid system: No evidence of stenosis, dissection, or occlusion. Vertebral arteries: No evidence of stenosis, dissection, or occlusion. Skeleton: No acute osseous abnormality. Other neck: Negative. Upper chest: Reticular opacities in the right apex, likely scarring or post radiation changes. No focal pulmonary opacity or pleural effusion in the imaged lung. IMPRESSION: 1. No evidence of arterial injury in the neck. 2.  No hemodynamically significant stenosis. Electronically Signed   By: Merilyn Baba M.D.   On: 09/13/2022 21:02    Procedures Procedures    Medications Ordered in ED Medications  lidocaine (LIDODERM) 5 % 1 patch (1 patch Transdermal Patch Applied 09/13/22 2134)  iohexol (OMNIPAQUE) 350 MG/ML injection 75 mL (75 mLs Intravenous Contrast Given 09/13/22 2042)  ketorolac (TORADOL) 15 MG/ML injection 15  mg (15 mg Intravenous Given 09/13/22 2134)    ED Course/ Medical Decision Making/ A&P                             Medical Decision Making Risk Prescription drug management.   Patient presents due to neck pain.  She is not having any headache component or visual component.  Is worse with muscle suggestive of torticollis or muscle strain.  It was atraumatic in nature, CTA neck was ordered in triage, this was negative for any acute process.  There is no evidence of aneurysm.  Afebrile, no nuchal rigidity I do not suspect infectious etiology.  Laboratory workup is reassuring without any leukocytosis or anemia.  Patient's pain improved with the Lidoderm and Toradol.  Will have her follow-up with her PCP, suspect muscle strain.  Precautions were discussed, stable for discharge at this time.  Final Clinical Impression(s) / ED Diagnoses Final diagnoses:  Neck pain    Rx / DC Orders ED Discharge Orders          Ordered    lidocaine (LIDODERM) 5 %  Every 24 hours        09/13/22 2122              Sherrill Raring, PA-C 09/13/22 2202    Leanord Asal K, DO 09/14/22 [phone]

## 2022-09-13 NOTE — ED Notes (Signed)
Pt A&OX4 ambulatory at d/c with independent steady gait. Pt verbalized understanding of d/c instructions, prescription and follow up care. 

## 2022-09-13 NOTE — Discharge Instructions (Signed)
You are seen today in the emergency department for neck pain.  Your workup today was very reassuring, no indication of abnormalities on the CTA.  Please follow-up with your primary to make persist.  The meantime you can use Lidoderm patch and take Tylenol and Motrin.

## 2022-09-17 ENCOUNTER — Telehealth: Payer: Self-pay | Admitting: Hematology and Oncology

## 2022-09-17 NOTE — Telephone Encounter (Signed)
Patient aware of upcoming appointment change 

## 2022-09-19 ENCOUNTER — Other Ambulatory Visit: Payer: Self-pay

## 2022-09-20 ENCOUNTER — Telehealth: Payer: Self-pay | Admitting: Hematology and Oncology

## 2022-09-20 NOTE — Telephone Encounter (Signed)
Scheduled appointment per WQ. Patient is aware of the made appointments.  

## 2022-09-21 ENCOUNTER — Other Ambulatory Visit: Payer: Self-pay

## 2022-09-22 ENCOUNTER — Other Ambulatory Visit: Payer: Self-pay

## 2022-09-22 ENCOUNTER — Other Ambulatory Visit: Payer: Self-pay | Admitting: Hematology and Oncology

## 2022-09-24 ENCOUNTER — Ambulatory Visit (HOSPITAL_BASED_OUTPATIENT_CLINIC_OR_DEPARTMENT_OTHER)
Admission: RE | Admit: 2022-09-24 | Discharge: 2022-09-24 | Disposition: A | Discharge status: Home / Self Care | Payer: Federal, State, Local not specified - PPO | Source: Ambulatory Visit | Attending: Cardiology | Admitting: Cardiology

## 2022-09-24 ENCOUNTER — Encounter (HOSPITAL_COMMUNITY): Payer: Self-pay | Admitting: Cardiology

## 2022-09-24 ENCOUNTER — Ambulatory Visit (HOSPITAL_COMMUNITY)
Admission: RE | Admit: 2022-09-24 | Discharge: 2022-09-24 | Disposition: A | Discharge status: Home / Self Care | Payer: Federal, State, Local not specified - PPO | Source: Ambulatory Visit | Attending: Family Medicine | Admitting: Family Medicine

## 2022-09-24 VITALS — BP 120/82 | HR 87 | Wt 141.6 lb

## 2022-09-24 DIAGNOSIS — C50211 Malignant neoplasm of upper-inner quadrant of right female breast: Secondary | ICD-10-CM

## 2022-09-24 DIAGNOSIS — Z0189 Encounter for other specified special examinations: Secondary | ICD-10-CM

## 2022-09-24 DIAGNOSIS — Z79899 Other long term (current) drug therapy: Secondary | ICD-10-CM | POA: Diagnosis not present

## 2022-09-24 DIAGNOSIS — Z853 Personal history of malignant neoplasm of breast: Secondary | ICD-10-CM | POA: Diagnosis not present

## 2022-09-24 DIAGNOSIS — I509 Heart failure, unspecified: Secondary | ICD-10-CM | POA: Diagnosis not present

## 2022-09-24 DIAGNOSIS — C50919 Malignant neoplasm of unspecified site of unspecified female breast: Secondary | ICD-10-CM | POA: Insufficient documentation

## 2022-09-24 DIAGNOSIS — I110 Hypertensive heart disease with heart failure: Secondary | ICD-10-CM | POA: Insufficient documentation

## 2022-09-24 DIAGNOSIS — E876 Hypokalemia: Secondary | ICD-10-CM | POA: Insufficient documentation

## 2022-09-24 DIAGNOSIS — Z170 Estrogen receptor positive status [ER+]: Secondary | ICD-10-CM

## 2022-09-24 LAB — ECHOCARDIOGRAM COMPLETE
AR max vel: 2.13 cm2
AV Area VTI: 2.31 cm2
AV Area mean vel: 2.2 cm2
AV Mean grad: 5 mmHg
AV Peak grad: 8.6 mmHg
Ao pk vel: 1.47 m/s
Area-P 1/2: 4.89 cm2
Calc EF: 68.4 %
S' Lateral: 2.3 cm
Single Plane A2C EF: 75.8 %
Single Plane A4C EF: 59.6 %

## 2022-09-24 MED ORDER — POTASSIUM CHLORIDE CRYS ER 20 MEQ PO TBCR: 40.0000 meq | EXTENDED_RELEASE_TABLET | Freq: Every day | ORAL | 1 refills | Status: DC

## 2022-09-24 NOTE — Patient Instructions (Signed)
INCREASE potassium to 40 (two tablets) daily  Your physician has requested that you have an echocardiogram. Echocardiography is a painless test that uses sound waves to create images of your heart. It provides your doctor with information about the size and shape of your heart and how well your heart's chambers and valves are working. This procedure takes approximately one hour. There are no restrictions for this procedure. Please do NOT wear cologne, perfume, aftershave, or lotions (deodorant is allowed). Please arrive 15 minutes prior to your appointment time.   Your physician recommends that you schedule a follow-up appointment in: 3 months with an echocardiogram. (July) ** please call the office in mid May to arrange follow up appointment. **  If you have any questions or concerns before your next appointment please send [place] a message through [place] or call our office at [phone].    TO LEAVE A MESSAGE FOR THE NURSE SELECT OPTION 2, PLEASE LEAVE A MESSAGE INCLUDING: YOUR NAME DATE OF BIRTH CALL BACK NUMBER REASON FOR CALL**this is important as we prioritize the call backs  YOU WILL RECEIVE A CALL BACK THE SAME DAY AS LONG AS YOU CALL BEFORE 4:00 PM   At the [facility], you and your health needs are our priority. As part of our continuing mission to provide you with exceptional heart care, we have created designated Provider Care Teams. These Care Teams include your primary Cardiologist (physician) and Advanced Practice Providers (APPs- Physician Assistants and Nurse Practitioners) who all work together to provide you with the care you need, when you need it.   You may see any of the following providers on your designated Care Team at your next follow up: Dr Arvilla Meres Dr Marca Ancona Dr. Marcos Eke, NP Robbie Lis, [place] [facility] [place], [place] Brynda Peon, NP Karle Plumber, PharmD   Please be sure to bring in all your  medications bottles to every appointment.    Thank you for choosing [facility]

## 2022-09-26 NOTE — Progress Notes (Signed)
Oncology: Dr. Al Pimple  52 y.o. with history of stage IV triple positive breast cancer presents for cardio-oncology evaluation.  Initial diagnosis in 1/14.  Bilateral mastectomy, ER+/PR+/HER2+.  She was treated with carboplatin/docetaxel/trastuzumab x 6 cycles then trastuzumab alone to complete 1 year.  She had radiation as well. In 4/23, patient was found to have recurrence with left malignant pleural effusion. She has completed chemo with docetaxel/trastuzumab/pertuzumab and will continue trastuzumab.    Echo in 10/23 showed. EF 60-65% with GLS -20.4% (stable). Echo in 1/24 showed EF 60-65%, RV normal, GLS less negative at -15.5% but poor tracking of the endocardium. Echo was done today and reviewed, EF 55-60%, GLS -18.4%, normal diastolic function, normal RV.   Patient did not have cardiac complications from her initial chemotherapy involving trastuzumab in 2014.  She does have treated HTN.  No family history of cardiomyopathy or early CAD.  Nonsmoker.  No exertional dyspnea or chest pain.  She continues on trastuzumab/Perjeta/tamoxifen.   Labs (5/23): K 3.3, creatinine 0.64 Labs (1/24): EF 3.3, creatinine 0.66, hgb 14.6 Labs (4/24): K 2.9, creatinine 0.51  PMH: 1. HTN 2. Migraines 3. Breast cancer: Initial diagnosis in 1/14.  Bilateral mastectomy, ER+/PR+/HER2+.  She was treated with carboplatin/docetaxel/trastuzumab x 6 cycles then trastuzumab alone to complete 1 year.  She had radiation.  - 4/23 patient had recurrence with left malignant pleural effusion found. She has started chemo with docetaxel/trastuzumab/pertuzumab.  - Echo (4/23): EF 60-65%, GLS -16.8%, RV normal.  - Echo (7/23): EF 65-70%, GLS -21.3%, RV normal.  - Echo (10/23): EF 60-65%, GLS -20.4%, normal diastolic function, normal RV, mild MR - Echo (1/24): EF 60-65%, RV normal, GLS less negative at -15.5% but poor tracking of the endocardium.  - Echo (4/24): EF 55-60%, GLS -18.4%, normal diastolic function, normal RV.   Social  History   Socioeconomic History   Marital status: Married    Spouse name: Not on file   Number of children: 2   Years of education: Not on file   Highest education level: Not on file  Occupational History    Employer: [employer]  Tobacco Use   Smoking status: Never   Smokeless tobacco: Never  Substance and Sexual Activity   Alcohol use: Yes    Comment: occasional   Drug use: No   Sexual activity: Yes    Birth control/protection: Surgical  Other Topics Concern   Not on file  Social History Narrative   Not on file   Social Determinants of Health   Financial Resource Strain: Not on file  Food Insecurity: Not on file  Transportation Needs: Not on file  Physical Activity: Not on file  Stress: Not on file  Social Connections: Not on file  Intimate Partner Violence: Not on file   Family History  Problem Relation Age of Onset   Hypertension Mother    Alcohol abuse Mother    Heart disease Maternal Grandmother    Stroke Maternal Grandfather    Colon cancer Maternal Aunt 55       alive at 78   Brain cancer Maternal Uncle 72       and lymphoma in early 22s; deceased   Brain cancer Maternal Uncle 60       deceased   Pancreatic cancer Maternal Uncle 45       alive at 43   Breast cancer Cousin 24       mat 1st cousin once removed through mat GF ; deceased   Breast cancer Maternal  Aunt        great aunt through mat GF; dx at ? age   ROS: All systems reviewed and negative except as per HPI.   Current Outpatient Medications  Medication Sig Dispense Refill   acetaminophen (TYLENOL) 500 MG tablet Take 1,000 mg by mouth every 6 (six) hours as needed for moderate pain.     amLODipine (NORVASC) 10 MG tablet Take 1 tablet (10 mg total) by mouth every morning. 30 tablet 3   lidocaine-prilocaine (EMLA) cream Apply 1 application. topically as needed. 30 g 0   tamoxifen (NOLVADEX) 20 MG tablet Take 1 tablet (20 mg total) by mouth daily. 90 tablet 3   triamcinolone ointment  (KENALOG) 0.1 % Apply topically 2 (two) times daily.     potassium chloride SA (KLOR-CON M20) 20 MEQ tablet Take 2 tablets (40 mEq total) by mouth daily. 90 tablet 1   No current facility-administered medications for this encounter.   Facility-Administered Medications Ordered in Other Encounters  Medication Dose Route Frequency Provider Last Rate Last Admin   acetaminophen (TYLENOL) 325 MG tablet            diphenhydrAMINE (BENADRYL) 25 mg capsule            BP 120/82   Pulse 87   Wt 64.2 kg (141 lb 9.6 oz)   SpO2 99%   BMI 25.90 kg/m  General: NAD Neck: No JVD, no thyromegaly or thyroid nodule.  Lungs: Clear to auscultation bilaterally with normal respiratory effort. CV: Nondisplaced PMI.  Heart regular S1/S2, no S3/S4, no murmur.  No peripheral edema.  No carotid bruit.  Normal pedal pulses.  Abdomen: Soft, nontender, no hepatosplenomegaly, no distention.  Skin: Intact without lesions or rashes.  Neurologic: Alert and oriented x 3.  Psych: Normal affect. Extremities: No clubbing or cyanosis.  HEENT: Normal.   Assessment/Plan: 1. Stage IV triple positive breast cancer: Patient had trastuzumab as part of her therapy back in 2014 with no cardiac complications.  She has been back on trastuzumab-based therapy for > 1 year now.  Today's echo shows stable LVEF with normal strain.  No exertional symptoms.  - I will obtain repeat echo in 3 months. If this remains normal, suspect we can increase the echo screening interval as I would generally expect to see cardiotoxicity from Herceptin by this point if she were to have it.  - She can continue Herceptin.  2. HTN: BP controlled on current amlodipine, continue.  3. Hypokalemia: K 2.9 on recent labs, increase KCl to 40 and repeat BMET in a week.   Followup 3 months with echo.  Marca Ancona 09/26/2022

## 2022-10-01 ENCOUNTER — Inpatient Hospital Stay: Payer: Federal, State, Local not specified - PPO | Admitting: Hematology and Oncology

## 2022-10-01 ENCOUNTER — Inpatient Hospital Stay: Payer: Federal, State, Local not specified - PPO

## 2022-10-01 ENCOUNTER — Ambulatory Visit: Payer: Federal, State, Local not specified - PPO

## 2022-10-01 ENCOUNTER — Other Ambulatory Visit: Payer: Federal, State, Local not specified - PPO

## 2022-10-01 ENCOUNTER — Other Ambulatory Visit: Payer: Self-pay

## 2022-10-01 ENCOUNTER — Inpatient Hospital Stay: Payer: Federal, State, Local not specified - PPO | Attending: Hematology and Oncology

## 2022-10-01 ENCOUNTER — Ambulatory Visit: Payer: Federal, State, Local not specified - PPO | Admitting: Hematology and Oncology

## 2022-10-01 ENCOUNTER — Inpatient Hospital Stay (HOSPITAL_BASED_OUTPATIENT_CLINIC_OR_DEPARTMENT_OTHER): Payer: Federal, State, Local not specified - PPO | Admitting: Hematology and Oncology

## 2022-10-01 ENCOUNTER — Encounter: Payer: Self-pay | Admitting: Hematology and Oncology

## 2022-10-01 VITALS — BP 119/83 | HR 94 | Temp 98.1°F | Resp 16 | Ht 62.0 in | Wt 138.4 lb

## 2022-10-01 VITALS — BP 136/91 | HR 90 | Resp 16

## 2022-10-01 DIAGNOSIS — C50211 Malignant neoplasm of upper-inner quadrant of right female breast: Secondary | ICD-10-CM

## 2022-10-01 DIAGNOSIS — Z809 Family history of malignant neoplasm, unspecified: Secondary | ICD-10-CM | POA: Insufficient documentation

## 2022-10-01 DIAGNOSIS — Z90710 Acquired absence of both cervix and uterus: Secondary | ICD-10-CM | POA: Insufficient documentation

## 2022-10-01 DIAGNOSIS — Z9013 Acquired absence of bilateral breasts and nipples: Secondary | ICD-10-CM | POA: Diagnosis not present

## 2022-10-01 DIAGNOSIS — Z5112 Encounter for antineoplastic immunotherapy: Secondary | ICD-10-CM | POA: Insufficient documentation

## 2022-10-01 DIAGNOSIS — Z807 Family history of other malignant neoplasms of lymphoid, hematopoietic and related tissues: Secondary | ICD-10-CM | POA: Diagnosis not present

## 2022-10-01 DIAGNOSIS — Z923 Personal history of irradiation: Secondary | ICD-10-CM | POA: Diagnosis not present

## 2022-10-01 DIAGNOSIS — Z800 Family history of malignant neoplasm of digestive organs: Secondary | ICD-10-CM | POA: Diagnosis not present

## 2022-10-01 DIAGNOSIS — Z803 Family history of malignant neoplasm of breast: Secondary | ICD-10-CM | POA: Diagnosis not present

## 2022-10-01 DIAGNOSIS — Z170 Estrogen receptor positive status [ER+]: Secondary | ICD-10-CM | POA: Diagnosis not present

## 2022-10-01 DIAGNOSIS — C7951 Secondary malignant neoplasm of bone: Secondary | ICD-10-CM | POA: Diagnosis not present

## 2022-10-01 DIAGNOSIS — Z79810 Long term (current) use of selective estrogen receptor modulators (SERMs): Secondary | ICD-10-CM | POA: Insufficient documentation

## 2022-10-01 LAB — CBC WITH DIFFERENTIAL (CANCER CENTER ONLY)
Abs Immature Granulocytes: 0.03 10*3/uL (ref 0.00–0.07)
Basophils Absolute: 0 10*3/uL (ref 0.0–0.1)
Basophils Relative: 1 %
Eosinophils Absolute: 0.1 10*3/uL (ref 0.0–0.5)
Eosinophils Relative: 2 %
HCT: 41.3 % (ref 36.0–46.0)
Hemoglobin: 14.8 g/dL (ref 12.0–15.0)
Immature Granulocytes: 1 %
Lymphocytes Relative: 31 %
Lymphs Abs: 1.7 10*3/uL (ref 0.7–4.0)
MCH: 30 pg (ref 26.0–34.0)
MCHC: 35.8 g/dL (ref 30.0–36.0)
MCV: 83.8 fL (ref 80.0–100.0)
Monocytes Absolute: 0.4 10*3/uL (ref 0.1–1.0)
Monocytes Relative: 6 %
Neutro Abs: 3.3 10*3/uL (ref 1.7–7.7)
Neutrophils Relative %: 59 %
Platelet Count: 243 10*3/uL (ref 150–400)
RBC: 4.93 MIL/uL (ref 3.87–5.11)
RDW: 12.3 % (ref 11.5–15.5)
WBC Count: 5.5 10*3/uL (ref 4.0–10.5)
nRBC: 0 % (ref 0.0–0.2)

## 2022-10-01 LAB — CMP (CANCER CENTER ONLY)
ALT: 11 U/L (ref 0–44)
AST: 16 U/L (ref 15–41)
Albumin: 4.4 g/dL (ref 3.5–5.0)
Alkaline Phosphatase: 50 U/L (ref 38–126)
Anion gap: 6 (ref 5–15)
BUN: 12 mg/dL (ref 6–20)
CO2: 27 mmol/L (ref 22–32)
Calcium: 9.5 mg/dL (ref 8.9–10.3)
Chloride: 109 mmol/L (ref 98–111)
Creatinine: 0.69 mg/dL (ref 0.44–1.00)
GFR, Estimated: >60 mL/min (ref >60)
Glucose, Bld: 93 mg/dL (ref 70–99)
Potassium: 3.5 mmol/L (ref 3.5–5.1)
Sodium: 142 mmol/L (ref 135–145)
Total Bilirubin: 0.3 mg/dL (ref 0.3–1.2)
Total Protein: 7.2 g/dL (ref 6.5–8.1)

## 2022-10-01 MED ORDER — SODIUM CHLORIDE 0.9% FLUSH
10.0000 mL | INTRAVENOUS | Status: DC | PRN
  Administered 2022-10-01: 10 mL

## 2022-10-01 MED ORDER — TRASTUZUMAB-DKST CHEMO 150 MG IV SOLR
6.0000 mg/kg | Freq: Once | INTRAVENOUS | Status: AC
  Administered 2022-10-01: 420 mg via INTRAVENOUS
  Filled 2022-10-01: qty 20

## 2022-10-01 MED ORDER — SODIUM CHLORIDE 0.9 % IV SOLN: Freq: Once | INTRAVENOUS | Status: AC

## 2022-10-01 MED ORDER — DIPHENHYDRAMINE HCL 25 MG PO CAPS
25.0000 mg | ORAL_CAPSULE | Freq: Once | ORAL | Status: AC
  Administered 2022-10-01: 25 mg via ORAL
  Filled 2022-10-01: qty 1

## 2022-10-01 MED ORDER — SODIUM CHLORIDE 0.9% FLUSH
10.0000 mL | Freq: Once | INTRAVENOUS | Status: AC
  Administered 2022-10-01: 10 mL

## 2022-10-01 MED ORDER — SODIUM CHLORIDE 0.9 % IV SOLN: Freq: Once | INTRAVENOUS | Status: DC

## 2022-10-01 MED ORDER — HEPARIN SOD (PORK) LOCK FLUSH 100 UNIT/ML IV SOLN
500.0000 [IU] | Freq: Once | INTRAVENOUS | Status: AC | PRN
  Administered 2022-10-01: 500 [IU]

## 2022-10-01 MED ORDER — ACETAMINOPHEN 325 MG PO TABS
650.0000 mg | ORAL_TABLET | Freq: Once | ORAL | Status: AC
  Administered 2022-10-01: 650 mg via ORAL
  Filled 2022-10-01: qty 2

## 2022-10-01 MED ORDER — SODIUM CHLORIDE 0.9 % IV SOLN
420.0000 mg | Freq: Once | INTRAVENOUS | Status: AC
  Administered 2022-10-01: 420 mg via INTRAVENOUS
  Filled 2022-10-01: qty 14

## 2022-10-01 NOTE — Progress Notes (Signed)
[facility] Cancer Follow up:    [name], Sherry Barry [address] [place] [address]   DIAGNOSIS:  Cancer Staging  Malignant neoplasm of upper-inner quadrant of right breast in female, estrogen receptor positive Staging form: Breast, AJCC 7th Edition - Clinical: Stage IA (T1c, N0, cM0) - Unsigned Specimen type: Core Needle Biopsy Histopathologic type: 9931 Laterality: Right Staging comments: Staged at breast conference 1.15.14  - Pathologic stage from 07/14/2012: Stage IV (TX, NX, M1) - Signed by Sherry Moulds, MD on 08/28/2021 Specimen type: Core Needle Biopsy Histopathologic type: 9931 Laterality: Right   SUMMARY OF ONCOLOGIC HISTORY: Oncology History  Malignant neoplasm of upper-inner quadrant of right breast in female, estrogen receptor positive  07/13/2012 Clinical Stage   Stage IA: T1c N0   07/14/2012 Definitive Surgery   Bilateral mastectomy/right SLNB: RIGHT invasive ductal carcinoma, grade 3, ER+, PR +, Her2/neu positive (ratio 3.02), Ki67 48%. DCIS. 1/4 LN positive for malignancy. LEFT: benign   07/14/2012 Pathologic Stage   Stage IIB: T2 N1a M0   07/14/2012 Cancer Staging   Staging form: Breast, AJCC 7th Edition - Pathologic stage from 07/14/2012: Stage IV (TX, NX, M1) - Signed by Sherry Moulds, MD on 08/28/2021 Specimen type: Core Needle Biopsy Histopathologic type: 9931 Laterality: Right   08/17/2012 - 08/30/2013 Chemotherapy   Adjuvant carboplatin, docetaxel, and trastuzumab x 6 cycles (completed 11/30/2012) followed by maintenance trastuzumab to total one year   11/2012 Procedure   Comp Cancer Gene panel (GeneDx) negative for deleterious mutations    - 02/2013 Radiation Therapy   Adjuvant RT to right breast   02/2013 -  Anti-estrogen oral therapy   Tamoxifen 20 mg daily   09/24/2013 Surgery   Bilateral breast reconstruction with latissmus flap and expander placement   12/12/2013 Surgery   Implant placement    Relapse/Recurrence    Left sided malignant pleural effusion.  Tumor cells are positive for GATA3 and ER, negative for TTF-1 consistent with recurrent breast carcinoma Prognostic showed ER 90% positive strong staining, PR 80% positive strong staining, HER2 negative.    09/16/2021 Imaging   PET scan showed malignant left pleural effusion with extensive areas of nodularity and hypermetabolic activity involving the mediastinal border and pericardium as well as the most inferior aspects of the costodiaphragmatic recess.  Signs of nodal disease at the thoracic inlet on the left suspect extension of disease below the diaphragm along the left anterolateral aorta.  Nonspecific moderate to markedly increased metabolic activity about the base of the tongue bilaterally.  Consider direct visualization showing mildly asymmetric uptake favoring the right lingual tonsil.  Sclerotic foci without marked increased metabolic activity suspicious for metastatic disease perhaps treated or questions.  Heterogeneous marrow uptake seen elsewhere generalized and nonspecific.  Consider spinal MRI is warranted  MRI brain without any evidence of intracranial metastatic disease.   10/09/2021 - 02/11/2022 Chemotherapy   Taxotere, Herceptin, Perjeta x 6   12/04/2021 Imaging   There is no evidence new metastatic disease. There is interval decrease in the left pleural effusion possibly suggesting resolving pleural metastatic disease. Few scattered sclerotic metastatic lesions in the skeletal structures have not changed significantly.   No acute findings are seen in the chest abdomen and pelvis.     02/22/2022 -  Antibody Plan   Herceptin/Perjeta every 3 weeks   03/21/2022 Imaging   CT chest abdomen pelvis  IMPRESSION: 1. No significant change since previous study. Again demonstrated is a chronic small left pleural effusion with basilar atelectasis and  scattered sclerotic skeletal metastases. 2. No acute abnormalities are demonstrated. 3. Small  esophageal hiatal hernia.     Electronically Signed   By: Burman Nieves M.D.   On: 03/21/2022 20:23     CURRENT THERAPY: THP  INTERVAL HISTORY:  Sherry Barry 52 y.o. female returns for follow-up Since last visit, she reports ongoing fatigue, otherwise no major complaints. She followed up with Dr Shirlee Latch, ok to continue herceptin. No change in breathing, cough, chest pain, shortness of breath. She denies any change in bowel habits or urinary habits.  No new neurological complaints. Rest of the pertinent 10 point ROS reviewed and negative.  Patient Active Problem List   Diagnosis Date Noted   Tachycardia 12/10/2021   Chemotherapy induced diarrhea 10/16/2021   Malignant pleural effusion 10/16/2021   CAP (community acquired pneumonia) 08/10/2021   Breast asymmetry following reconstructive surgery 09/09/2015   Status post bilateral breast reconstruction 12/20/2013   Acquired absence of bilateral breasts and nipples 09/24/2013   History of breast cancer 08/17/2013   Anxiety 06/28/2013   Eczema 06/28/2013   Malignant neoplasm of upper-inner quadrant of right breast in female, estrogen receptor positive 06/20/2012   Essential hypertension 05/02/2012   Migraine 04/17/2012    is allergic to codeine, hydrocodone, latex, lisinopril, and tomato.  MEDICAL HISTORY: Past Medical History:  Diagnosis Date   Allergy    Breast cancer    Breast cancer    right   History of chemotherapy    doxetaxel/carboplatin/trastuzumab   History of migraine    last one about a week ago   Hx of radiation therapy 01/01/13- 02/15/13   r chest wall, r supraclav/axilla 5040 cGy/28 sessions, scar boost 1000 cGy/5 sessions   Hypertension    no meds,   urgent care on pomana   Migraine    Migraine     SURGICAL HISTORY: Past Surgical History:  Procedure Laterality Date   ABDOMINAL HYSTERECTOMY     no salpingo-oophorectomy 2009   BREAST RECONSTRUCTION WITH PLACEMENT OF TISSUE EXPANDER AND  FLEX HD (ACELLULAR HYDRATED DERMIS) Left 09/24/2013   IR IMAGING GUIDED PORT INSERTION  10/07/2021   IR THORACENTESIS ASP PLEURAL SPACE W/IMG GUIDE  08/11/2021   IR THORACENTESIS ASP PLEURAL SPACE W/IMG GUIDE  10/07/2021   LATISSIMUS FLAP TO BREAST Right 09/24/2013   Procedure: RIGHT LATISSMUS MYOCUTAEIOUS MUSCLE FLAP AND PLACEMENT OF TISSUE Lurena Nida;  Surgeon: Wayland Denis, DO;  Location: [facility];  Service: Plastics;  Laterality: Right;   LIPOSUCTION WITH LIPOFILLING Bilateral 12/12/2013   Procedure: LIPOSUCTION WITH LIPOFILLING;  Surgeon: Wayland Denis, DO;  Location: [facility];  Service: Plastics;  Laterality: Bilateral;   PORT-A-CATH REMOVAL Left 12/12/2013   Procedure: REMOVAL PORT-A-CATH;  Surgeon: Wayland Denis, DO;  Location: [facility];  Service: Plastics;  Laterality: Left;   PORTACATH PLACEMENT  07/14/2012   Procedure: INSERTION PORT-A-CATH;  Surgeon: Clovis Pu. Cornett, MD;  Location: [facility];  Service: General;  Laterality: Left;   RECONSTRUCTION BREAST W/ LATISSIMUS DORSI FLAP Right 09/24/2013   "& tissue expander placement"   REMOVAL OF BILATERAL TISSUE EXPANDERS WITH PLACEMENT OF BILATERAL BREAST IMPLANTS Bilateral 12/12/2013   Procedure: REMOVAL OF BILATERAL TISSUE EXPANDERS WITH PLACEMENT OF BILATERAL BREAST IMPLANTS/BILATERAL CAPSULECTOMIES WITH  LIPOFILLING FAT GRAFTING;  Surgeon: Wayland Denis, DO;  Location: [facility];  Service: Plastics;  Laterality: Bilateral;   SIMPLE MASTECTOMY WITH AXILLARY SENTINEL NODE BIOPSY  07/14/2012   Procedure: SIMPLE MASTECTOMY WITH AXILLARY SENTINEL NODE BIOPSY;  Surgeon: Clovis Pu.  Cornett, MD;  Location: [facility];  Service: General;  Laterality: Right;  Bilateral simple mastectomy with port and right sebtibel lymph node mapping   SIMPLE MASTECTOMY WITH AXILLARY SENTINEL NODE BIOPSY  07/14/2012   Procedure: SIMPLE MASTECTOMY;  Surgeon: Clovis Pu. Cornett, MD;  Location: [facility];  Service: General;  Laterality: Left;    TISSUE EXPANDER PLACEMENT Left 09/24/2013   Procedure: PLACEMENT OF TISSUE EXPANDER AND FLEX HD TO LEFT BREAST;  Surgeon: Wayland Denis, DO;  Location: [facility];  Service: Plastics;  Laterality: Left;    SOCIAL HISTORY: Social History   Socioeconomic History   Marital status: Married    Spouse name: Not on file   Number of children: 2   Years of education: Not on file   Highest education level: Not on file  Occupational History    Employer: [employer]  Tobacco Use   Smoking status: Never   Smokeless tobacco: Never  Substance and Sexual Activity   Alcohol use: Yes    Comment: occasional   Drug use: No   Sexual activity: Yes    Birth control/protection: Surgical  Other Topics Concern   Not on file  Social History Narrative   Not on file   Social Determinants of Health   Financial Resource Strain: Not on file  Food Insecurity: Not on file  Transportation Needs: Not on file  Physical Activity: Not on file  Stress: Not on file  Social Connections: Not on file  Intimate Partner Violence: Not on file    FAMILY HISTORY: Family History  Problem Relation Age of Onset   Hypertension Mother    Alcohol abuse Mother    Heart disease Maternal Grandmother    Stroke Maternal Grandfather    Colon cancer Maternal Aunt 55       alive at 62   Brain cancer Maternal Uncle 38       and lymphoma in early 28s; deceased   Brain cancer Maternal Uncle 60       deceased   Pancreatic cancer Maternal Uncle 45       alive at 86   Breast cancer Cousin 58       mat 1st cousin once removed through mat GF ; deceased   Breast cancer Maternal Aunt        great aunt through mat GF; dx at ? age    Review of Systems  Constitutional:  Positive for fatigue. Negative for appetite change, chills, fever and unexpected weight change.  HENT:   Negative for hearing loss, lump/mass and trouble swallowing.   Eyes:  Negative for eye problems and icterus.  Respiratory:  Negative for chest tightness and  cough.   Cardiovascular:  Negative for chest pain, leg swelling and palpitations.  Gastrointestinal:  Negative for abdominal distention, abdominal pain, constipation, diarrhea, nausea and vomiting.  Endocrine: Negative for hot flashes.  Genitourinary:  Negative for difficulty urinating.   Musculoskeletal:  Negative for arthralgias and myalgias.  Skin:  Negative for itching and rash.  Neurological:  Negative for dizziness, extremity weakness, headaches and numbness.  Hematological:  Negative for adenopathy. Does not bruise/bleed easily.  Psychiatric/Behavioral:  Negative for depression. The patient is not nervous/anxious.     PHYSICAL EXAMINATION  ECOG PERFORMANCE STATUS: 1 - Symptomatic but completely ambulatory  Vitals:   10/01/22 1147  BP: 119/83  Pulse: 94  Resp: 16  Temp: 98.1 F (36.7 C)  SpO2: 100%   Physical Exam Constitutional:      General: She  is not in acute distress.    Appearance: Normal appearance. She is not toxic-appearing.  HENT:     Head: Normocephalic and atraumatic.  Eyes:     General: No scleral icterus. Cardiovascular:     Rate and Rhythm: Regular rhythm.     Pulses: Normal pulses.     Heart sounds: Normal heart sounds.  Pulmonary:     Effort: Pulmonary effort is normal.     Breath sounds: Normal breath sounds.  Abdominal:     General: Abdomen is flat. Bowel sounds are normal. There is no distension.     Palpations: Abdomen is soft.     Tenderness: There is no abdominal tenderness.  Musculoskeletal:        General: No swelling.     Cervical back: Neck supple.  Lymphadenopathy:     Cervical: No cervical adenopathy.  Skin:    General: Skin is warm and dry.     Findings: No rash.  Neurological:     General: No focal deficit present.     Mental Status: She is alert.  Psychiatric:        Mood and Affect: Mood normal.        Behavior: Behavior normal.     LABORATORY DATA:  CBC    Component Value Date/Time   WBC 5.5 10/01/2022 1123    WBC 5.4 09/13/2022 1656   RBC 4.93 10/01/2022 1123   HGB 14.8 10/01/2022 1123   HGB 15.9 10/30/2019 1030   HGB 14.7 02/15/2017 0852   HCT 41.3 10/01/2022 1123   HCT 46.1 10/30/2019 1030   HCT 42.9 02/15/2017 0852   PLT 243 10/01/2022 1123   PLT 245 10/30/2019 1030   MCV 83.8 10/01/2022 1123   MCV 88 10/30/2019 1030   MCV 86.7 02/15/2017 0852   MCH 30.0 10/01/2022 1123   MCHC 35.8 10/01/2022 1123   RDW 12.3 10/01/2022 1123   RDW 12.5 10/30/2019 1030   RDW 12.6 02/15/2017 0852   LYMPHSABS 1.7 10/01/2022 1123   LYMPHSABS 1.4 02/15/2017 0852   MONOABS 0.4 10/01/2022 1123   MONOABS 0.3 02/15/2017 0852   EOSABS 0.1 10/01/2022 1123   EOSABS 0.1 02/15/2017 0852   BASOSABS 0.0 10/01/2022 1123   BASOSABS 0.0 02/15/2017 0852    CMP     Component Value Date/Time   NA 142 09/13/2022 1656   NA 140 10/30/2019 1030   NA 141 02/15/2017 0852   K 2.9 (L) 09/13/2022 1656   K 3.9 02/15/2017 0852   CL 109 09/13/2022 1656   CL 101 11/15/2012 0904   CO2 23 09/13/2022 1656   CO2 25 02/15/2017 0852   GLUCOSE 118 (H) 09/13/2022 1656   GLUCOSE 87 02/15/2017 0852   GLUCOSE 69 (L) 11/15/2012 0904   BUN 11 09/13/2022 1656   BUN 11 10/30/2019 1030   BUN 12.5 02/15/2017 0852   CREATININE 0.57 09/13/2022 1656   CREATININE 0.68 09/10/2022 0925   CREATININE 0.9 02/15/2017 0852   CALCIUM 9.0 09/13/2022 1656   CALCIUM 9.3 02/15/2017 0852   PROT 6.9 09/10/2022 0925   PROT 6.7 10/30/2019 1030   PROT 7.0 02/15/2017 0852   ALBUMIN 4.2 09/10/2022 0925   ALBUMIN 4.2 10/30/2019 1030   ALBUMIN 3.6 02/15/2017 0852   AST 15 09/10/2022 0925   AST 16 02/15/2017 0852   ALT 10 09/10/2022 0925   ALT 13 02/15/2017 0852   ALKPHOS 53 09/10/2022 0925   ALKPHOS 63 02/15/2017 0852   BILITOT 0.5 09/10/2022 1610  BILITOT 0.69 02/15/2017 0852   GFRNONAA >60 09/13/2022 1656   GFRNONAA >60 09/10/2022 0925   GFRAA 100 10/30/2019 1030     ASSESSMENT and THERAPY PLAN:   Sherry Barry is a 52 y.o.  female who returns for a follow-up for stage IV triple positive breast cancer.  #Stage IV triple positive breast cancer:  --Currently treatment includes Herceptin, pertuzumab and tamoxifen -- She is now on Herceptin and pertuzumab maintenance.   --She followed up with Dr Shirlee Latch for monitoring of cardiac toxicity.--Most recent imaging with no evidence of active disease. --No dental concerns reported. Continue zoledronic acid as scheduled. --Continue herceptin/perjeta maintenance and follow up every 6 weeks with me --Will plan repeat imaging in July after 24.  Gabapentin 300 mg nightly for hot flashes  Follow up:  She understands that the treatment is indefinite as long as it works for her or as long as there is no unacceptable toxicity. Zometa given history of bone metastasis, last dose in early March, next due on May 26 total time spent: 30 minutes  Sherry Moulds MD

## 2022-10-01 NOTE — Patient Instructions (Signed)
[facility]  Discharge Instructions: Thank you for choosing [facility] to provide your oncology and hematology care.   If you have a lab appointment with the Cancer Center, please go directly to the Cancer Center and check in at the registration area.   Wear comfortable clothing and clothing appropriate for easy access to any Portacath or PICC line.   We strive to give you quality time with your provider. You may need to reschedule your appointment if you arrive late (15 or more minutes).  Arriving late affects you and other patients whose appointments are after yours.  Also, if you miss three or more appointments without notifying the office, you may be dismissed from the clinic at the provider's discretion.      For prescription refill requests, have your pharmacy contact our office and allow 72 hours for refills to be completed.    Today you received the following chemotherapy and/or immunotherapy agents: trastuzumab (ogivri) & pertuzumab (perjeta)      To help prevent nausea and vomiting after your treatment, we encourage you to take your nausea medication as directed.  BELOW ARE SYMPTOMS THAT SHOULD BE REPORTED IMMEDIATELY: *FEVER GREATER THAN 100.4 F (38 C) OR HIGHER *CHILLS OR SWEATING *NAUSEA AND VOMITING THAT IS NOT CONTROLLED WITH YOUR NAUSEA MEDICATION *UNUSUAL SHORTNESS OF BREATH *UNUSUAL BRUISING OR BLEEDING *URINARY PROBLEMS (pain or burning when urinating, or frequent urination) *BOWEL PROBLEMS (unusual diarrhea, constipation, pain near the anus) TENDERNESS IN MOUTH AND THROAT WITH OR WITHOUT PRESENCE OF ULCERS (sore throat, sores in mouth, or a toothache) UNUSUAL RASH, SWELLING OR PAIN  UNUSUAL VAGINAL DISCHARGE OR ITCHING   Items with * indicate a potential emergency and should be followed up as soon as possible or go to the Emergency Department if any problems should occur.  Please show the CHEMOTHERAPY ALERT CARD or  IMMUNOTHERAPY ALERT CARD at check-in to the Emergency Department and triage nurse.  Should you have questions after your visit or need to cancel or reschedule your appointment, please contact [facility]  Dept: [phone]  and follow the prompts.  Office hours are 8:00 a.m. to 4:30 p.m. Monday - Friday. Please note that voicemails left after 4:00 p.m. may not be returned until the following business day.  We are closed weekends and major holidays. You have access to a nurse at all times for urgent questions. Please call the main number to the clinic Dept: [phone] and follow the prompts.   For any non-urgent questions, you may also contact your provider using MyChart. We now offer e-Visits for anyone 18 and older to request care online for non-urgent symptoms. For details visit [email or web address].   Also download the MyChart app! Go to the app store, search "MyChart", open the app, select [facility], and log in with your MyChart username and password.  Zoledronic Acid Injection (Bone Disorders) What is this medication? ZOLEDRONIC ACID (ZOE le dron ik AS id) prevents and treats osteoporosis. It may also be used to treat Paget's disease of the bone. It works by making your bones stronger and less likely to break (fracture). It belongs to a group of medications called bisphosphonates. This medicine may be used for other purposes; ask your health care provider or pharmacist if you have questions. COMMON BRAND NAME(S): Reclast What should I tell my care team before I take this medication? They need to know if you have any of these conditions: Bleeding   disorder Cancer Dental disease Kidney disease Low levels of calcium in the blood Low red blood cell counts Lung or breathing disease, such as asthma Receiving steroids, such as dexamethasone or prednisone An unusual or allergic reaction to zoledronic acid, other medications, foods, dyes, or  preservatives Pregnant or trying to get pregnant Breast-feeding How should I use this medication? This medication is injected into a vein. It is given by your care team in a hospital or clinic setting. A special MedGuide will be given to you before each treatment. Be sure to read this information carefully each time. Talk to your care team about the use of this medication in children. Special care may be needed. Overdosage: If you think you have taken too much of this medicine contact a poison control center or emergency room at once. NOTE: This medicine is only for you. Do not share this medicine with others. What if I miss a dose? Keep appointments for follow-up doses. It is important not to miss your dose. Call your care team if you are unable to keep an appointment. What may interact with this medication? Certain antibiotics given by injection Medications for pain and inflammation, such as ibuprofen, naproxen, NSAIDs Some diuretics, such as bumetanide, furosemide Teriparatide This list may not describe all possible interactions. Give your health care provider a list of all the medicines, herbs, non-prescription drugs, or dietary supplements you use. Also tell them if you smoke, drink alcohol, or use illegal drugs. Some items may interact with your medicine. What should I watch for while using this medication? Visit your care team for regular checks on your progress. It may be some time before you see the benefit from this medication. Some people who take this medication have severe bone, joint, or muscle pain. This medication may also increase your risk for jaw problems or a broken thigh bone. Tell your care team right away if you have severe pain in your jaw, bones, joints, or muscles. Tell your care team if you have any pain that does not go away or that gets worse. You should make sure you get enough calcium and vitamin D while you are taking this medication. Discuss the foods you eat and  the vitamins you take with your care team. You may need bloodwork while taking this medication. Tell your dentist and dental surgeon that you are taking this medication. You should not have major dental surgery while on this medication. See your dentist to have a dental exam and fix any dental problems before starting this medication. Take good care of your teeth while on this medication. Make sure you see your dentist for regular follow-up appointments. What side effects may I notice from receiving this medication? Side effects that you should report to your care team as soon as possible: Allergic reactions--skin rash, itching, hives, swelling of the face, lips, tongue, or throat Kidney injury--decrease in the amount of urine, swelling of the ankles, hands, or feet Low calcium level--muscle pain or cramps, confusion, tingling, or numbness in the hands or feet Osteonecrosis of the jaw--pain, swelling, or redness in the mouth, numbness of the jaw, poor healing after dental work, unusual discharge from the mouth, visible bones in the mouth Severe bone, joint, or muscle pain Side effects that usually do not require medical attention (report to your care team if they continue or are bothersome): Diarrhea Dizziness Headache Nausea Stomach pain Vomiting This list may not describe all possible side effects. Call your doctor for medical advice about side   effects. You may report side effects to FDA at 1-800-FDA-1088. Where should I keep my medication? This medication is given in a hospital or clinic. It will not be stored at home. NOTE: This sheet is a summary. It may not cover all possible information. If you have questions about this medicine, talk to your doctor, pharmacist, or health care provider.  2023 Elsevier/Gold Standard (2021-07-17 00:00:00)  Goserelin Implant What is this medication? GOSERELIN (GOE se rel in) treats prostate cancer and breast cancer. It works by decreasing levels of the  hormones testosterone and estrogen in the body. This prevents prostate and breast cancer cells from spreading or growing. It may also be used to treat endometriosis. This is a condition where the tissue that lines the uterus grows outside the uterus. It works by decreasing the amount of estrogen your body makes, which reduces heavy bleeding and pain. It can also be used to help thin the lining of the uterus before a surgery used to prevent or reduce heavy periods. This medicine may be used for other purposes; ask your health care provider or pharmacist if you have questions. COMMON BRAND NAME(S): Zoladex, Zoladex 3-Month What should I tell my care team before I take this medication? They need to know if you have any of these conditions: Bone problems Diabetes Heart disease History of irregular heartbeat or rhythm An unusual or allergic reaction to goserelin, other medications, foods, dyes, or preservatives Pregnant or trying to get pregnant Breastfeeding How should I use this medication? This medication is injected under the skin. It is given by your care team in a hospital or clinic setting. Talk to your care team about the use of this medication in children. Special care may be needed. Overdosage: If you think you have taken too much of this medicine contact a poison control center or emergency room at once. NOTE: This medicine is only for you. Do not share this medicine with others. What if I miss a dose? Keep appointments for follow-up doses. It is important not to miss your dose. Call your care team if you are unable to keep an appointment. What may interact with this medication? Do not take this medication with any of the following: Cisapride Dronedarone Pimozide Thioridazine This medication may also interact with the following: Other medications that cause heart rhythm changes This list may not describe all possible interactions. Give your health care provider a list of all the  medicines, herbs, non-prescription drugs, or dietary supplements you use. Also tell them if you smoke, drink alcohol, or use illegal drugs. Some items may interact with your medicine. What should I watch for while using this medication? Visit your care team for regular checks on your progress. Your symptoms may appear to get worse during the first weeks of this therapy. Tell your care team if your symptoms do not start to get better or if they get worse after this time. Using this medication for a long time may weaken your bones. If you smoke or frequently drink alcohol you may increase your risk of bone loss. A family history of osteoporosis, chronic use of medications for seizures (convulsions), or corticosteroids can also increase your risk of bone loss. The risk of bone fractures may be increased. Talk to your care team about your bone health. This medication may increase blood sugar. The risk may be higher in patients who already have diabetes. Ask your care team what you can do to lower your risk of diabetes while taking this medication.   This medication should stop regular monthly menstruation in women. Tell your care team if you continue to menstruate. Talk to your care team if you wish to become pregnant or think you might be pregnant. This medication can cause serious birth defects if taken during pregnancy or for 12 weeks after stopping treatment. Talk to your care team about reliable forms of contraception. Do not breastfeed while taking this medication. This medication may cause infertility. Talk to your care team if you are concerned about your fertility. What side effects may I notice from receiving this medication? Side effects that you should report to your care team as soon as possible: Allergic reactions--skin rash, itching, hives, swelling of the face, lips, tongue, or throat Change in the amount of urine Heart attack--pain or tightness in the chest, shoulders, arms, or jaw, nausea,  shortness of breath, cold or clammy skin, feeling faint or lightheaded Heart rhythm changes--fast or irregular heartbeat, dizziness, feeling faint or lightheaded, chest pain, trouble breathing High blood sugar (hyperglycemia)--increased thirst or amount of urine, unusual weakness or fatigue, blurry vision High calcium level--increased thirst or amount of urine, nausea, vomiting, confusion, unusual weakness or fatigue, bone pain Pain, redness, irritation, or bruising at the injection site Severe back pain, numbness or weakness of the hands, arms, legs, or feet, loss of coordination, loss of bowel or bladder control Stroke--sudden numbness or weakness of the face, arm, or leg, trouble speaking, confusion, trouble walking, loss of balance or coordination, dizziness, severe headache, change in vision Swelling and pain of the tumor site or lymph nodes Trouble passing urine Side effects that usually do not require medical attention (report to your care team if they continue or are bothersome): Change in sex drive or performance Headache Hot flashes Rapid or extreme change in emotion or mood Sweating Swelling of the ankles, hands, or feet Unusual vaginal discharge, itching, or odor This list may not describe all possible side effects. Call your doctor for medical advice about side effects. You may report side effects to FDA at 1-800-FDA-1088. Where should I keep my medication? This medication is given in a hospital or clinic. It will not be stored at home. NOTE: This sheet is a summary. It may not cover all possible information. If you have questions about this medicine, talk to your doctor, pharmacist, or health care provider.  2023 Elsevier/Gold Standard (2021-10-14 00:00:00)    

## 2022-10-01 NOTE — Progress Notes (Signed)
Will continue trastuzumab 420 mg which is slightly outside of 10% for today.  Demetrius Charity, PharmD

## 2022-10-01 NOTE — Progress Notes (Signed)
Patient declined to stay for 30 minute post Perjeta observation period. Vital signs consistent with previous, tolerated previous infusions well. Patient ambulatory to lobby for discharge.

## 2022-10-08 ENCOUNTER — Other Ambulatory Visit: Payer: Self-pay

## 2022-10-18 DIAGNOSIS — I10 Essential (primary) hypertension: Secondary | ICD-10-CM | POA: Diagnosis not present

## 2022-10-18 DIAGNOSIS — Z79899 Other long term (current) drug therapy: Secondary | ICD-10-CM | POA: Diagnosis not present

## 2022-10-18 DIAGNOSIS — E782 Mixed hyperlipidemia: Secondary | ICD-10-CM | POA: Diagnosis not present

## 2022-10-18 DIAGNOSIS — C782 Secondary malignant neoplasm of pleura: Secondary | ICD-10-CM | POA: Diagnosis not present

## 2022-10-18 DIAGNOSIS — C50211 Malignant neoplasm of upper-inner quadrant of right female breast: Secondary | ICD-10-CM | POA: Diagnosis not present

## 2022-10-18 DIAGNOSIS — Z170 Estrogen receptor positive status [ER+]: Secondary | ICD-10-CM | POA: Diagnosis not present

## 2022-10-22 ENCOUNTER — Other Ambulatory Visit: Payer: Self-pay

## 2022-10-22 ENCOUNTER — Inpatient Hospital Stay (HOSPITAL_BASED_OUTPATIENT_CLINIC_OR_DEPARTMENT_OTHER): Payer: Federal, State, Local not specified - PPO | Admitting: Adult Health

## 2022-10-22 ENCOUNTER — Other Ambulatory Visit: Payer: Self-pay | Admitting: Hematology and Oncology

## 2022-10-22 ENCOUNTER — Inpatient Hospital Stay: Payer: Federal, State, Local not specified - PPO

## 2022-10-22 ENCOUNTER — Encounter: Payer: Self-pay | Admitting: Adult Health

## 2022-10-22 ENCOUNTER — Inpatient Hospital Stay: Payer: Federal, State, Local not specified - PPO | Attending: Hematology and Oncology

## 2022-10-22 VITALS — BP 117/86 | HR 87 | Resp 16

## 2022-10-22 VITALS — BP 133/96 | HR 92 | Temp 97.7°F | Resp 16 | Wt 137.8 lb

## 2022-10-22 DIAGNOSIS — Z79810 Long term (current) use of selective estrogen receptor modulators (SERMs): Secondary | ICD-10-CM | POA: Diagnosis not present

## 2022-10-22 DIAGNOSIS — C50211 Malignant neoplasm of upper-inner quadrant of right female breast: Secondary | ICD-10-CM

## 2022-10-22 DIAGNOSIS — Z923 Personal history of irradiation: Secondary | ICD-10-CM | POA: Insufficient documentation

## 2022-10-22 DIAGNOSIS — Z170 Estrogen receptor positive status [ER+]: Secondary | ICD-10-CM

## 2022-10-22 DIAGNOSIS — Z803 Family history of malignant neoplasm of breast: Secondary | ICD-10-CM | POA: Insufficient documentation

## 2022-10-22 DIAGNOSIS — Z5112 Encounter for antineoplastic immunotherapy: Secondary | ICD-10-CM | POA: Diagnosis not present

## 2022-10-22 DIAGNOSIS — Z90710 Acquired absence of both cervix and uterus: Secondary | ICD-10-CM | POA: Insufficient documentation

## 2022-10-22 DIAGNOSIS — Z9013 Acquired absence of bilateral breasts and nipples: Secondary | ICD-10-CM | POA: Diagnosis not present

## 2022-10-22 DIAGNOSIS — C7951 Secondary malignant neoplasm of bone: Secondary | ICD-10-CM | POA: Insufficient documentation

## 2022-10-22 DIAGNOSIS — Z800 Family history of malignant neoplasm of digestive organs: Secondary | ICD-10-CM | POA: Diagnosis not present

## 2022-10-22 LAB — CBC WITH DIFFERENTIAL (CANCER CENTER ONLY)
Abs Immature Granulocytes: 0.01 10*3/uL (ref 0.00–0.07)
Basophils Absolute: 0 10*3/uL (ref 0.0–0.1)
Basophils Relative: 1 %
Eosinophils Absolute: 0.1 10*3/uL (ref 0.0–0.5)
Eosinophils Relative: 2 %
HCT: 40.7 % (ref 36.0–46.0)
Hemoglobin: 14.4 g/dL (ref 12.0–15.0)
Immature Granulocytes: 0 %
Lymphocytes Relative: 33 %
Lymphs Abs: 2.1 10*3/uL (ref 0.7–4.0)
MCH: 30 pg (ref 26.0–34.0)
MCHC: 35.4 g/dL (ref 30.0–36.0)
MCV: 84.8 fL (ref 80.0–100.0)
Monocytes Absolute: 0.4 10*3/uL (ref 0.1–1.0)
Monocytes Relative: 6 %
Neutro Abs: 3.6 10*3/uL (ref 1.7–7.7)
Neutrophils Relative %: 58 %
Platelet Count: 249 10*3/uL (ref 150–400)
RBC: 4.8 MIL/uL (ref 3.87–5.11)
RDW: 12.2 % (ref 11.5–15.5)
WBC Count: 6.2 10*3/uL (ref 4.0–10.5)
nRBC: 0 % (ref 0.0–0.2)

## 2022-10-22 LAB — CMP (CANCER CENTER ONLY)
ALT: 12 U/L (ref 0–44)
AST: 16 U/L (ref 15–41)
Albumin: 4.2 g/dL (ref 3.5–5.0)
Alkaline Phosphatase: 42 U/L (ref 38–126)
Anion gap: 6 (ref 5–15)
BUN: 15 mg/dL (ref 6–20)
CO2: 27 mmol/L (ref 22–32)
Calcium: 8.7 mg/dL — ABNORMAL LOW (ref 8.9–10.3)
Chloride: 108 mmol/L (ref 98–111)
Creatinine: 0.58 mg/dL (ref 0.44–1.00)
GFR, Estimated: >60 mL/min (ref >60)
Glucose, Bld: 86 mg/dL (ref 70–99)
Potassium: 3.5 mmol/L (ref 3.5–5.1)
Sodium: 141 mmol/L (ref 135–145)
Total Bilirubin: 0.4 mg/dL (ref 0.3–1.2)
Total Protein: 6.9 g/dL (ref 6.5–8.1)

## 2022-10-22 MED ORDER — SODIUM CHLORIDE 0.9 % IV SOLN: Freq: Once | INTRAVENOUS | Status: AC

## 2022-10-22 MED ORDER — SODIUM CHLORIDE 0.9% FLUSH
10.0000 mL | INTRAVENOUS | Status: DC | PRN
  Administered 2022-10-22: 10 mL

## 2022-10-22 MED ORDER — DIPHENHYDRAMINE HCL 25 MG PO CAPS
25.0000 mg | ORAL_CAPSULE | Freq: Once | ORAL | Status: AC
  Administered 2022-10-22: 25 mg via ORAL
  Filled 2022-10-22: qty 1

## 2022-10-22 MED ORDER — SODIUM CHLORIDE 0.9% FLUSH
10.0000 mL | Freq: Once | INTRAVENOUS | Status: AC
  Administered 2022-10-22: 10 mL

## 2022-10-22 MED ORDER — ACETAMINOPHEN 325 MG PO TABS
650.0000 mg | ORAL_TABLET | Freq: Once | ORAL | Status: AC
  Administered 2022-10-22: 650 mg via ORAL
  Filled 2022-10-22: qty 2

## 2022-10-22 MED ORDER — HEPARIN SOD (PORK) LOCK FLUSH 100 UNIT/ML IV SOLN: 500.0000 [IU] | Freq: Once | INTRAVENOUS | Status: DC

## 2022-10-22 MED ORDER — HEPARIN SOD (PORK) LOCK FLUSH 100 UNIT/ML IV SOLN
500.0000 [IU] | Freq: Once | INTRAVENOUS | Status: AC | PRN
  Administered 2022-10-22: 500 [IU]

## 2022-10-22 MED ORDER — TRASTUZUMAB-DKST CHEMO 150 MG IV SOLR
6.0000 mg/kg | Freq: Once | INTRAVENOUS | Status: AC
  Administered 2022-10-22: 420 mg via INTRAVENOUS
  Filled 2022-10-22: qty 20

## 2022-10-22 MED ORDER — LIDOCAINE-PRILOCAINE 2.5-2.5 % EX CREA: 1.0000 | TOPICAL_CREAM | CUTANEOUS | 0 refills | Status: DC | PRN

## 2022-10-22 MED ORDER — SODIUM CHLORIDE 0.9 % IV SOLN
420.0000 mg | Freq: Once | INTRAVENOUS | Status: AC
  Administered 2022-10-22: 420 mg via INTRAVENOUS
  Filled 2022-10-22: qty 14

## 2022-10-22 NOTE — Progress Notes (Signed)
Patient declined to stay for 30 minute post Perjeta infusion. Tolerated infusion well with no adverse symptoms. No Hx of adverse symptoms post Perjeta. VS WNL. Pt left ambulatory to lobby.

## 2022-10-22 NOTE — Patient Instructions (Signed)
[facility]  Discharge Instructions: Thank you for choosing [facility] to provide your oncology and hematology care.   If you have a lab appointment with the Cancer Center, please go directly to the Cancer Center and check in at the registration area.   Wear comfortable clothing and clothing appropriate for easy access to any Portacath or PICC line.   We strive to give you quality time with your provider. You may need to reschedule your appointment if you arrive late (15 or more minutes).  Arriving late affects you and other patients whose appointments are after yours.  Also, if you miss three or more appointments without notifying the office, you may be dismissed from the clinic at the provider's discretion.      For prescription refill requests, have your pharmacy contact our office and allow 72 hours for refills to be completed.    Today you received the following chemotherapy and/or immunotherapy agents: trastuzumab (ogivri) & pertuzumab (perjeta)      To help prevent nausea and vomiting after your treatment, we encourage you to take your nausea medication as directed.  BELOW ARE SYMPTOMS THAT SHOULD BE REPORTED IMMEDIATELY: *FEVER GREATER THAN 100.4 F (38 C) OR HIGHER *CHILLS OR SWEATING *NAUSEA AND VOMITING THAT IS NOT CONTROLLED WITH YOUR NAUSEA MEDICATION *UNUSUAL SHORTNESS OF BREATH *UNUSUAL BRUISING OR BLEEDING *URINARY PROBLEMS (pain or burning when urinating, or frequent urination) *BOWEL PROBLEMS (unusual diarrhea, constipation, pain near the anus) TENDERNESS IN MOUTH AND THROAT WITH OR WITHOUT PRESENCE OF ULCERS (sore throat, sores in mouth, or a toothache) UNUSUAL RASH, SWELLING OR PAIN  UNUSUAL VAGINAL DISCHARGE OR ITCHING   Items with * indicate a potential emergency and should be followed up as soon as possible or go to the Emergency Department if any problems should occur.  Please show the CHEMOTHERAPY ALERT CARD or  IMMUNOTHERAPY ALERT CARD at check-in to the Emergency Department and triage nurse.  Should you have questions after your visit or need to cancel or reschedule your appointment, please contact [facility]  Dept: [phone]  and follow the prompts.  Office hours are 8:00 a.m. to 4:30 p.m. Monday - Friday. Please note that voicemails left after 4:00 p.m. may not be returned until the following business day.  We are closed weekends and major holidays. You have access to a nurse at all times for urgent questions. Please call the main number to the clinic Dept: [phone] and follow the prompts.   For any non-urgent questions, you may also contact your provider using MyChart. We now offer e-Visits for anyone 18 and older to request care online for non-urgent symptoms. For details visit [email or web address].   Also download the MyChart app! Go to the app store, search "MyChart", open the app, select [facility], and log in with your MyChart username and password.  Zoledronic Acid Injection (Bone Disorders) What is this medication? ZOLEDRONIC ACID (ZOE le dron ik AS id) prevents and treats osteoporosis. It may also be used to treat Paget's disease of the bone. It works by making your bones stronger and less likely to break (fracture). It belongs to a group of medications called bisphosphonates. This medicine may be used for other purposes; ask your health care provider or pharmacist if you have questions. COMMON BRAND NAME(S): Reclast What should I tell my care team before I take this medication? They need to know if you have any of these conditions: Bleeding   disorder Cancer Dental disease Kidney disease Low levels of calcium in the blood Low red blood cell counts Lung or breathing disease, such as asthma Receiving steroids, such as dexamethasone or prednisone An unusual or allergic reaction to zoledronic acid, other medications, foods, dyes, or  preservatives Pregnant or trying to get pregnant Breast-feeding How should I use this medication? This medication is injected into a vein. It is given by your care team in a hospital or clinic setting. A special MedGuide will be given to you before each treatment. Be sure to read this information carefully each time. Talk to your care team about the use of this medication in children. Special care may be needed. Overdosage: If you think you have taken too much of this medicine contact a poison control center or emergency room at once. NOTE: This medicine is only for you. Do not share this medicine with others. What if I miss a dose? Keep appointments for follow-up doses. It is important not to miss your dose. Call your care team if you are unable to keep an appointment. What may interact with this medication? Certain antibiotics given by injection Medications for pain and inflammation, such as ibuprofen, naproxen, NSAIDs Some diuretics, such as bumetanide, furosemide Teriparatide This list may not describe all possible interactions. Give your health care provider a list of all the medicines, herbs, non-prescription drugs, or dietary supplements you use. Also tell them if you smoke, drink alcohol, or use illegal drugs. Some items may interact with your medicine. What should I watch for while using this medication? Visit your care team for regular checks on your progress. It may be some time before you see the benefit from this medication. Some people who take this medication have severe bone, joint, or muscle pain. This medication may also increase your risk for jaw problems or a broken thigh bone. Tell your care team right away if you have severe pain in your jaw, bones, joints, or muscles. Tell your care team if you have any pain that does not go away or that gets worse. You should make sure you get enough calcium and vitamin D while you are taking this medication. Discuss the foods you eat and  the vitamins you take with your care team. You may need bloodwork while taking this medication. Tell your dentist and dental surgeon that you are taking this medication. You should not have major dental surgery while on this medication. See your dentist to have a dental exam and fix any dental problems before starting this medication. Take good care of your teeth while on this medication. Make sure you see your dentist for regular follow-up appointments. What side effects may I notice from receiving this medication? Side effects that you should report to your care team as soon as possible: Allergic reactions--skin rash, itching, hives, swelling of the face, lips, tongue, or throat Kidney injury--decrease in the amount of urine, swelling of the ankles, hands, or feet Low calcium level--muscle pain or cramps, confusion, tingling, or numbness in the hands or feet Osteonecrosis of the jaw--pain, swelling, or redness in the mouth, numbness of the jaw, poor healing after dental work, unusual discharge from the mouth, visible bones in the mouth Severe bone, joint, or muscle pain Side effects that usually do not require medical attention (report to your care team if they continue or are bothersome): Diarrhea Dizziness Headache Nausea Stomach pain Vomiting This list may not describe all possible side effects. Call your doctor for medical advice about side   effects. You may report side effects to FDA at 1-800-FDA-1088. Where should I keep my medication? This medication is given in a hospital or clinic. It will not be stored at home. NOTE: This sheet is a summary. It may not cover all possible information. If you have questions about this medicine, talk to your doctor, pharmacist, or health care provider.  2023 Elsevier/Gold Standard (2021-07-17 00:00:00)  Goserelin Implant What is this medication? GOSERELIN (GOE se rel in) treats prostate cancer and breast cancer. It works by decreasing levels of the  hormones testosterone and estrogen in the body. This prevents prostate and breast cancer cells from spreading or growing. It may also be used to treat endometriosis. This is a condition where the tissue that lines the uterus grows outside the uterus. It works by decreasing the amount of estrogen your body makes, which reduces heavy bleeding and pain. It can also be used to help thin the lining of the uterus before a surgery used to prevent or reduce heavy periods. This medicine may be used for other purposes; ask your health care provider or pharmacist if you have questions. COMMON BRAND NAME(S): Zoladex, Zoladex 3-Month What should I tell my care team before I take this medication? They need to know if you have any of these conditions: Bone problems Diabetes Heart disease History of irregular heartbeat or rhythm An unusual or allergic reaction to goserelin, other medications, foods, dyes, or preservatives Pregnant or trying to get pregnant Breastfeeding How should I use this medication? This medication is injected under the skin. It is given by your care team in a hospital or clinic setting. Talk to your care team about the use of this medication in children. Special care may be needed. Overdosage: If you think you have taken too much of this medicine contact a poison control center or emergency room at once. NOTE: This medicine is only for you. Do not share this medicine with others. What if I miss a dose? Keep appointments for follow-up doses. It is important not to miss your dose. Call your care team if you are unable to keep an appointment. What may interact with this medication? Do not take this medication with any of the following: Cisapride Dronedarone Pimozide Thioridazine This medication may also interact with the following: Other medications that cause heart rhythm changes This list may not describe all possible interactions. Give your health care provider a list of all the  medicines, herbs, non-prescription drugs, or dietary supplements you use. Also tell them if you smoke, drink alcohol, or use illegal drugs. Some items may interact with your medicine. What should I watch for while using this medication? Visit your care team for regular checks on your progress. Your symptoms may appear to get worse during the first weeks of this therapy. Tell your care team if your symptoms do not start to get better or if they get worse after this time. Using this medication for a long time may weaken your bones. If you smoke or frequently drink alcohol you may increase your risk of bone loss. A family history of osteoporosis, chronic use of medications for seizures (convulsions), or corticosteroids can also increase your risk of bone loss. The risk of bone fractures may be increased. Talk to your care team about your bone health. This medication may increase blood sugar. The risk may be higher in patients who already have diabetes. Ask your care team what you can do to lower your risk of diabetes while taking this medication.   This medication should stop regular monthly menstruation in women. Tell your care team if you continue to menstruate. Talk to your care team if you wish to become pregnant or think you might be pregnant. This medication can cause serious birth defects if taken during pregnancy or for 12 weeks after stopping treatment. Talk to your care team about reliable forms of contraception. Do not breastfeed while taking this medication. This medication may cause infertility. Talk to your care team if you are concerned about your fertility. What side effects may I notice from receiving this medication? Side effects that you should report to your care team as soon as possible: Allergic reactions--skin rash, itching, hives, swelling of the face, lips, tongue, or throat Change in the amount of urine Heart attack--pain or tightness in the chest, shoulders, arms, or jaw, nausea,  shortness of breath, cold or clammy skin, feeling faint or lightheaded Heart rhythm changes--fast or irregular heartbeat, dizziness, feeling faint or lightheaded, chest pain, trouble breathing High blood sugar (hyperglycemia)--increased thirst or amount of urine, unusual weakness or fatigue, blurry vision High calcium level--increased thirst or amount of urine, nausea, vomiting, confusion, unusual weakness or fatigue, bone pain Pain, redness, irritation, or bruising at the injection site Severe back pain, numbness or weakness of the hands, arms, legs, or feet, loss of coordination, loss of bowel or bladder control Stroke--sudden numbness or weakness of the face, arm, or leg, trouble speaking, confusion, trouble walking, loss of balance or coordination, dizziness, severe headache, change in vision Swelling and pain of the tumor site or lymph nodes Trouble passing urine Side effects that usually do not require medical attention (report to your care team if they continue or are bothersome): Change in sex drive or performance Headache Hot flashes Rapid or extreme change in emotion or mood Sweating Swelling of the ankles, hands, or feet Unusual vaginal discharge, itching, or odor This list may not describe all possible side effects. Call your doctor for medical advice about side effects. You may report side effects to FDA at 1-800-FDA-1088. Where should I keep my medication? This medication is given in a hospital or clinic. It will not be stored at home. NOTE: This sheet is a summary. It may not cover all possible information. If you have questions about this medicine, talk to your doctor, pharmacist, or health care provider.  2023 Elsevier/Gold Standard (2021-10-14 00:00:00)    

## 2022-10-22 NOTE — Progress Notes (Signed)
Sherry Barry Follow up:    Sherry Congress, NP [place] [address]   DIAGNOSIS:  Barry Staging  Malignant neoplasm of upper-inner quadrant of right breast in female, estrogen receptor positive (Sherry Barry) Staging form: Breast, AJCC 7th Edition - Clinical: Stage IA (T1c, N0, cM0) - Unsigned Specimen type: Core Needle Biopsy Histopathologic type: 9931 Laterality: Right Staging comments: Staged at breast conference 1.15.14  - Pathologic stage from 07/14/2012: Stage IV ([place], NX, M1) - Signed by Sherry Pike, MD on 08/28/2021 Specimen type: Core Needle Biopsy Histopathologic type: 9931 Laterality: Right   SUMMARY OF ONCOLOGIC HISTORY: Oncology History  Malignant neoplasm of upper-inner quadrant of right breast in female, estrogen receptor positive (Sherry Barry)  07/13/2012 Clinical Stage   Stage IA: T1c N0   07/14/2012 Definitive Surgery   Bilateral mastectomy/right SLNB: RIGHT invasive ductal carcinoma, grade 3, ER+, PR +, Her2/neu positive (ratio 3.02), Ki67 48%. DCIS. 1/4 LN positive for malignancy. LEFT: benign   07/14/2012 Pathologic Stage   Stage IIB: T2 N1a M0   07/14/2012 Barry Staging   Staging form: Breast, AJCC 7th Edition - Pathologic stage from 07/14/2012: Stage IV ([place], NX, M1) - Signed by Sherry Pike, MD on 08/28/2021 Specimen type: Core Needle Biopsy Histopathologic type: 9931 Laterality: Right   08/17/2012 - 08/30/2013 Chemotherapy   Adjuvant carboplatin, docetaxel, and trastuzumab x 6 cycles (completed 11/30/2012) followed by maintenance trastuzumab to total one year   11/2012 Procedure   Comp Barry Gene panel (GeneDx) negative for deleterious mutations    - 02/2013 Radiation Therapy   Adjuvant RT to right breast   02/2013 -  Anti-estrogen oral therapy   Tamoxifen 20 mg daily   09/24/2013 Surgery   Bilateral breast reconstruction with latissmus flap and expander placement   12/12/2013 Surgery   Implant placement     Relapse/Recurrence   Left sided malignant pleural effusion.  Tumor cells are positive for GATA3 and ER, negative for TTF-1 consistent with recurrent breast carcinoma Prognostic showed ER 90% positive strong staining, PR 80% positive strong staining, HER2 negative.    09/16/2021 Imaging   PET scan showed malignant left pleural effusion with extensive areas of nodularity and hypermetabolic activity involving the mediastinal border and pericardium as well as the most inferior aspects of the costodiaphragmatic recess.  Signs of nodal disease at the thoracic inlet on the left suspect extension of disease below the diaphragm along the left anterolateral aorta.  Nonspecific moderate to markedly increased metabolic activity about the base of the tongue bilaterally.  Consider direct visualization showing mildly asymmetric uptake favoring the right lingual tonsil.  Sclerotic foci without marked increased metabolic activity suspicious for metastatic disease perhaps treated or questions.  Heterogeneous marrow uptake seen elsewhere generalized and nonspecific.  Consider spinal MRI is warranted  MRI brain without any evidence of intracranial metastatic disease.   10/09/2021 - 02/11/2022 Chemotherapy   Taxotere, Herceptin, Perjeta x 6   12/04/2021 Imaging   There is no evidence new metastatic disease. There is interval decrease in the left pleural effusion possibly suggesting resolving pleural metastatic disease. Few scattered sclerotic metastatic lesions in the skeletal structures have not changed significantly.   No acute findings are seen in the chest abdomen and pelvis.     02/22/2022 -  Antibody Plan   Herceptin/Perjeta every 3 weeks   03/21/2022 Imaging   CT chest abdomen pelvis  IMPRESSION: 1. No significant change since previous study. Again demonstrated is a chronic small left pleural effusion with basilar  atelectasis and scattered sclerotic skeletal metastases. 2. No acute abnormalities are  demonstrated. 3. Small esophageal hiatal hernia.     Electronically Signed   By: Sherry Barry M.D.   On: 03/21/2022 20:23     CURRENT THERAPY:Herceptin/Perjeta; tamoxifen  INTERVAL HISTORY: Sherry Barry 52 y.o. female returns for f/u of her metastatic breast Barry prior to receiving Herceptin/Perjeta.  She is tolerating treatment moderately well.  Her most recent echocardiogram occurred on September 24, 2022 demonstrating a left ventricular ejection fraction of 55 to 60%.  She follows with Dr. Shirlee Latch in cardio oncology.  Her most recent restaging CT chest abdomen pelvis showed no progression of disease back in January 2024.  Her next scan is scheduled December 29, 2022.  She has no concerns today.  She tells me she is feeling well.   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  MEDICAL HISTORY: Past Medical History:  Diagnosis Date   Allergy    Breast Barry (HCC)    Breast Barry (HCC)    right   History of chemotherapy    doxetaxel/carboplatin/trastuzumab   History of migraine    last one about a week ago   Hx of radiation therapy 01/01/13- 02/15/13   r chest wall, r supraclav/axilla 5040 cGy/28 sessions, scar boost 1000 cGy/5 sessions   Hypertension    no meds,   urgent care on pomana   Migraine     Migraine     SURGICAL HISTORY: Past Surgical History:  Procedure Laterality Date   ABDOMINAL HYSTERECTOMY     no salpingo-oophorectomy 2009   BREAST RECONSTRUCTION WITH PLACEMENT OF TISSUE EXPANDER AND FLEX HD (ACELLULAR HYDRATED DERMIS) Left 09/24/2013   IR IMAGING GUIDED PORT INSERTION  10/07/2021   IR THORACENTESIS ASP PLEURAL SPACE W/IMG GUIDE  08/11/2021   IR THORACENTESIS ASP PLEURAL SPACE W/IMG GUIDE  10/07/2021   LATISSIMUS FLAP TO BREAST Right 09/24/2013   Procedure: RIGHT LATISSMUS MYOCUTAEIOUS MUSCLE FLAP AND PLACEMENT OF TISSUE Lurena Nida;  Surgeon: Wayland Denis, DO;  Location: [facility];  Service: Plastics;  Laterality: Right;   LIPOSUCTION WITH LIPOFILLING Bilateral 12/12/2013   Procedure: LIPOSUCTION WITH LIPOFILLING;  Surgeon: Wayland Denis, DO;  Location: [facility];  Service: Plastics;  Laterality: Bilateral;   PORT-A-CATH REMOVAL Left 12/12/2013   Procedure: REMOVAL PORT-A-CATH;  Surgeon: Wayland Denis, DO;  Location: [facility];  Service: Plastics;  Laterality: Left;   PORTACATH PLACEMENT  07/14/2012   Procedure: INSERTION PORT-A-CATH;  Surgeon: Clovis Pu. Cornett, MD;  Location: [facility];  Service: General;  Laterality: Left;   RECONSTRUCTION BREAST W/ LATISSIMUS DORSI FLAP Right 09/24/2013   "& tissue expander placement"   REMOVAL OF BILATERAL TISSUE EXPANDERS WITH PLACEMENT OF BILATERAL BREAST IMPLANTS Bilateral 12/12/2013   Procedure: REMOVAL OF BILATERAL TISSUE EXPANDERS WITH PLACEMENT OF BILATERAL BREAST IMPLANTS/BILATERAL CAPSULECTOMIES WITH  LIPOFILLING FAT GRAFTING;  Surgeon: Wayland Denis, DO;  Location:  [facility];  Service: Plastics;  Laterality: Bilateral;   SIMPLE MASTECTOMY WITH AXILLARY SENTINEL NODE BIOPSY  07/14/2012   Procedure: SIMPLE MASTECTOMY WITH AXILLARY SENTINEL NODE BIOPSY;  Surgeon: Clovis Pu. Cornett, MD;  Location: [facility];  Service: General;  Laterality: Right;  Bilateral simple mastectomy with port and right sebtibel  lymph node mapping   SIMPLE MASTECTOMY WITH AXILLARY SENTINEL NODE BIOPSY  07/14/2012   Procedure: SIMPLE MASTECTOMY;  Surgeon: Clovis Pu. Cornett, MD;  Location: [facility];  Service: General;  Laterality: Left;   TISSUE EXPANDER PLACEMENT Left 09/24/2013   Procedure: PLACEMENT OF TISSUE EXPANDER AND FLEX HD TO LEFT BREAST;  Surgeon: Wayland Denis, DO;  Location: [facility];  Service: Plastics;  Laterality: Left;    SOCIAL HISTORY: Social History   Socioeconomic History   Marital status: Married    Spouse name: Not on file   Number of children: 2   Years of education: Not on file   Highest education level: Not on file  Occupational History    Employer: [employer]  Tobacco Use   Smoking status: Never   Smokeless tobacco: Never  Substance and Sexual Activity   Alcohol use: Yes    Comment: occasional   Drug use: No   Sexual activity: Yes    Birth control/protection: Surgical  Other Topics Concern   Not on file  Social History Narrative   Not on file   Social Determinants of Health   Financial Resource Strain: Not on file  Food Insecurity: Not on file  Transportation Needs: Not on file  Physical Activity: Not on file  Stress: Not on file  Social Connections: Not on file  Intimate Partner Violence: Not on file    FAMILY HISTORY: Family History  Problem Relation Age of Onset   Hypertension Mother    Alcohol abuse Mother    Heart disease Maternal Grandmother    Stroke Maternal Grandfather    Colon Barry Maternal Aunt 55       alive at 67   Brain Barry Maternal Uncle 87       and lymphoma in early 5s; deceased   Brain Barry Maternal Uncle 60       deceased   Pancreatic Barry Maternal Uncle 45       alive at 51   Breast Barry Cousin 19       mat 1st cousin once removed through mat GF ; deceased   Breast Barry Maternal Aunt        great aunt through mat GF; dx at ? age    Review of Systems  Constitutional:  Negative for appetite change, chills, fatigue, fever and  unexpected weight change.  HENT:   Negative for hearing loss, lump/mass and trouble swallowing.   Eyes:  Negative for eye problems and icterus.  Respiratory:  Negative for chest tightness, cough and shortness of breath.   Cardiovascular:  Negative for chest pain, leg swelling and palpitations.  Gastrointestinal:  Negative for abdominal distention, abdominal pain, constipation, diarrhea, nausea and vomiting.  Endocrine: Negative for hot flashes.  Genitourinary:  Negative for difficulty urinating.   Musculoskeletal:  Negative for arthralgias.  Skin:  Negative for itching and rash.  Neurological:  Negative for dizziness, extremity weakness, headaches and numbness.  Hematological:  Negative for adenopathy. Does not bruise/bleed easily.  Psychiatric/Behavioral:  Negative for depression. The patient is not nervous/anxious.       PHYSICAL EXAMINATION    Vitals:   10/22/22 1342  BP: [name])  133/96  Pulse: 92  Resp: 16  Temp: 97.7 F (36.5 C)  SpO2: 100%    Physical Exam Constitutional:      General: She is not in acute distress.    Appearance: Normal appearance. She is not toxic-appearing.  HENT:     Head: Normocephalic and atraumatic.  Eyes:     General: No scleral icterus. Cardiovascular:     Rate and Rhythm: Normal rate and regular rhythm.     Pulses: Normal pulses.     Heart sounds: Normal heart sounds.  Pulmonary:     Effort: Pulmonary effort is normal.     Breath sounds: Normal breath sounds.  Abdominal:     General: Abdomen is flat. Bowel sounds are normal. There is no distension.     Palpations: Abdomen is soft.     Tenderness: There is no abdominal tenderness.  Musculoskeletal:        General: No swelling.     Cervical back: Neck supple.  Lymphadenopathy:     Cervical: No cervical adenopathy.  Skin:    General: Skin is warm and dry.     Findings: No rash.  Neurological:     General: No focal deficit present.     Mental Status: She is alert.  Psychiatric:         Mood and Affect: Mood normal.        Behavior: Behavior normal.     LABORATORY DATA:  CBC    Component Value Date/Time   WBC 6.2 10/22/2022 1318   WBC 5.4 09/13/2022 1656   RBC 4.80 10/22/2022 1318   HGB 14.4 10/22/2022 1318   HGB 15.9 10/30/2019 1030   HGB 14.7 02/15/2017 0852   HCT 40.7 10/22/2022 1318   HCT 46.1 10/30/2019 1030   HCT 42.9 02/15/2017 0852   PLT 249 10/22/2022 1318   PLT 245 10/30/2019 1030   MCV 84.8 10/22/2022 1318   MCV 88 10/30/2019 1030   MCV 86.7 02/15/2017 0852   MCH 30.0 10/22/2022 1318   MCHC 35.4 10/22/2022 1318   RDW 12.2 10/22/2022 1318   RDW 12.5 10/30/2019 1030   RDW 12.6 02/15/2017 0852   LYMPHSABS 2.1 10/22/2022 1318   LYMPHSABS 1.4 02/15/2017 0852   MONOABS 0.4 10/22/2022 1318   MONOABS 0.3 02/15/2017 0852   EOSABS 0.1 10/22/2022 1318   EOSABS 0.1 02/15/2017 0852   BASOSABS 0.0 10/22/2022 1318   BASOSABS 0.0 02/15/2017 0852    CMP     Component Value Date/Time   NA 141 10/22/2022 1318   NA 140 10/30/2019 1030   NA 141 02/15/2017 0852   K 3.5 10/22/2022 1318   K 3.9 02/15/2017 0852   CL 108 10/22/2022 1318   CL 101 11/15/2012 0904   CO2 27 10/22/2022 1318   CO2 25 02/15/2017 0852   GLUCOSE 86 10/22/2022 1318   GLUCOSE 87 02/15/2017 0852   GLUCOSE 69 (L) 11/15/2012 0904   BUN 15 10/22/2022 1318   BUN 11 10/30/2019 1030   BUN 12.5 02/15/2017 0852   CREATININE 0.58 10/22/2022 1318   CREATININE 0.9 02/15/2017 0852   CALCIUM 8.7 (L) 10/22/2022 1318   CALCIUM 9.3 02/15/2017 0852   PROT 6.9 10/22/2022 1318   PROT 6.7 10/30/2019 1030   PROT 7.0 02/15/2017 0852   ALBUMIN 4.2 10/22/2022 1318   ALBUMIN 4.2 10/30/2019 1030   ALBUMIN 3.6 02/15/2017 0852   AST 16 10/22/2022 1318   AST 16 02/15/2017 0852   ALT 12 10/22/2022 1318  ALT 13 02/15/2017 0852   ALKPHOS 42 10/22/2022 1318   ALKPHOS 63 02/15/2017 0852   BILITOT 0.4 10/22/2022 1318   BILITOT 0.69 02/15/2017 0852   GFRNONAA >60 10/22/2022 1318   GFRAA 100  10/30/2019 1030       ASSESSMENT and THERAPY PLAN:   Malignant neoplasm of upper-inner quadrant of right breast in female, estrogen receptor positive (HCC) Learta Codding is a 52 year old woman with metastatic breast Barry currently on treatment with Zoladex, tamoxifen, and Herceptin Perjeta.    Rainey has no clinical signs of breast Barry progression today.  [name] with her in detail and she will proceed with her treatment today.  Her next Zoladex is due Nov 12, 2022.  She will continue on tamoxifen daily and will proceed with the Herceptin Perjeta today.  [name] and we will see her back in 3 weeks for labs, follow-up, and her next infusion.   All questions were answered. The patient knows to call the clinic with any problems, questions or concerns. We can certainly see the patient much sooner if necessary.  Total encounter time:20 minutes*in face-to-face visit time, chart review, lab review, care coordination, order entry, and documentation of the encounter time.  Lillard Anes, NP 10/22/22 2:44 PM Medical Oncology and Hematology [facility] [address] [place], [place] [address] Tel. [phone]    Fax. [phone]  *Total Encounter Time as defined by the Centers for Medicare and Medicaid Services includes, in addition to the face-to-face time of a patient visit (documented in the note above) non-face-to-face time: obtaining and reviewing outside history, ordering and reviewing medications, tests or procedures, care coordination (communications with other health care professionals or caregivers) and documentation in the medical record.

## 2022-10-22 NOTE — Assessment & Plan Note (Signed)
Sherry Barry is a 52 year old woman with metastatic breast cancer currently on treatment with Zoladex, tamoxifen, and Herceptin Perjeta.    [name] has no clinical signs of breast cancer progression today.  [name] with her in detail and she will proceed with her treatment today.  Her next Zoladex is due Nov 12, 2022.  She will continue on tamoxifen daily and will proceed with the Herceptin Perjeta today.  [name] and we will see her back in 3 weeks for labs, follow-up, and her next infusion.

## 2022-10-26 ENCOUNTER — Other Ambulatory Visit: Payer: Self-pay

## 2022-11-12 ENCOUNTER — Other Ambulatory Visit: Payer: Self-pay | Admitting: *Deleted

## 2022-11-12 ENCOUNTER — Inpatient Hospital Stay: Payer: Federal, State, Local not specified - PPO

## 2022-11-12 ENCOUNTER — Inpatient Hospital Stay (HOSPITAL_BASED_OUTPATIENT_CLINIC_OR_DEPARTMENT_OTHER): Payer: Federal, State, Local not specified - PPO | Admitting: Hematology and Oncology

## 2022-11-12 VITALS — BP 129/91 | HR 92 | Temp 97.7°F | Resp 16 | Wt 137.4 lb

## 2022-11-12 VITALS — BP 120/90 | HR 87 | Resp 17

## 2022-11-12 DIAGNOSIS — Z170 Estrogen receptor positive status [ER+]: Secondary | ICD-10-CM

## 2022-11-12 DIAGNOSIS — Z923 Personal history of irradiation: Secondary | ICD-10-CM | POA: Diagnosis not present

## 2022-11-12 DIAGNOSIS — C7951 Secondary malignant neoplasm of bone: Secondary | ICD-10-CM | POA: Diagnosis not present

## 2022-11-12 DIAGNOSIS — Z803 Family history of malignant neoplasm of breast: Secondary | ICD-10-CM | POA: Diagnosis not present

## 2022-11-12 DIAGNOSIS — Z800 Family history of malignant neoplasm of digestive organs: Secondary | ICD-10-CM | POA: Diagnosis not present

## 2022-11-12 DIAGNOSIS — C50211 Malignant neoplasm of upper-inner quadrant of right female breast: Secondary | ICD-10-CM

## 2022-11-12 DIAGNOSIS — Z90710 Acquired absence of both cervix and uterus: Secondary | ICD-10-CM | POA: Diagnosis not present

## 2022-11-12 DIAGNOSIS — Z79810 Long term (current) use of selective estrogen receptor modulators (SERMs): Secondary | ICD-10-CM | POA: Diagnosis not present

## 2022-11-12 DIAGNOSIS — Z9013 Acquired absence of bilateral breasts and nipples: Secondary | ICD-10-CM | POA: Diagnosis not present

## 2022-11-12 DIAGNOSIS — Z5112 Encounter for antineoplastic immunotherapy: Secondary | ICD-10-CM | POA: Diagnosis not present

## 2022-11-12 LAB — CBC WITH DIFFERENTIAL (CANCER CENTER ONLY)
Abs Immature Granulocytes: 0.02 10*3/uL (ref 0.00–0.07)
Basophils Absolute: 0 10*3/uL (ref 0.0–0.1)
Basophils Relative: 1 %
Eosinophils Absolute: 0.1 10*3/uL (ref 0.0–0.5)
Eosinophils Relative: 2 %
HCT: 41.4 % (ref 36.0–46.0)
Hemoglobin: 14.4 g/dL (ref 12.0–15.0)
Immature Granulocytes: 0 %
Lymphocytes Relative: 31 %
Lymphs Abs: 1.9 10*3/uL (ref 0.7–4.0)
MCH: 29.6 pg (ref 26.0–34.0)
MCHC: 34.8 g/dL (ref 30.0–36.0)
MCV: 85 fL (ref 80.0–100.0)
Monocytes Absolute: 0.4 10*3/uL (ref 0.1–1.0)
Monocytes Relative: 6 %
Neutro Abs: 3.7 10*3/uL (ref 1.7–7.7)
Neutrophils Relative %: 60 %
Platelet Count: 225 10*3/uL (ref 150–400)
RBC: 4.87 MIL/uL (ref 3.87–5.11)
RDW: 12.3 % (ref 11.5–15.5)
WBC Count: 6.1 10*3/uL (ref 4.0–10.5)
nRBC: 0 % (ref 0.0–0.2)

## 2022-11-12 LAB — CMP (CANCER CENTER ONLY)
ALT: 12 U/L (ref 0–44)
AST: 17 U/L (ref 15–41)
Albumin: 4.3 g/dL (ref 3.5–5.0)
Alkaline Phosphatase: 45 U/L (ref 38–126)
Anion gap: 9 (ref 5–15)
BUN: 12 mg/dL (ref 6–20)
CO2: 26 mmol/L (ref 22–32)
Calcium: 8.9 mg/dL (ref 8.9–10.3)
Chloride: 107 mmol/L (ref 98–111)
Creatinine: 0.65 mg/dL (ref 0.44–1.00)
GFR, Estimated: >60 mL/min (ref >60)
Glucose, Bld: 86 mg/dL (ref 70–99)
Potassium: 3.2 mmol/L — ABNORMAL LOW (ref 3.5–5.1)
Sodium: 142 mmol/L (ref 135–145)
Total Bilirubin: 0.4 mg/dL (ref 0.3–1.2)
Total Protein: 7 g/dL (ref 6.5–8.1)

## 2022-11-12 MED ORDER — DIPHENHYDRAMINE HCL 25 MG PO CAPS
25.0000 mg | ORAL_CAPSULE | Freq: Once | ORAL | Status: AC
  Administered 2022-11-12: 25 mg via ORAL
  Filled 2022-11-12: qty 1

## 2022-11-12 MED ORDER — ZOLEDRONIC ACID 4 MG/100ML IV SOLN
4.0000 mg | Freq: Once | INTRAVENOUS | Status: AC
  Administered 2022-11-12: 4 mg via INTRAVENOUS
  Filled 2022-11-12: qty 100

## 2022-11-12 MED ORDER — SODIUM CHLORIDE 0.9% FLUSH
10.0000 mL | INTRAVENOUS | Status: DC | PRN
  Administered 2022-11-12: 10 mL

## 2022-11-12 MED ORDER — SODIUM CHLORIDE 0.9 % IV SOLN: Freq: Once | INTRAVENOUS | Status: AC

## 2022-11-12 MED ORDER — SODIUM CHLORIDE 0.9% FLUSH
10.0000 mL | Freq: Once | INTRAVENOUS | Status: AC
  Administered 2022-11-12: 10 mL

## 2022-11-12 MED ORDER — HEPARIN SOD (PORK) LOCK FLUSH 100 UNIT/ML IV SOLN
500.0000 [IU] | Freq: Once | INTRAVENOUS | Status: AC | PRN
  Administered 2022-11-12: 500 [IU]

## 2022-11-12 MED ORDER — GOSERELIN ACETATE 10.8 MG ~~LOC~~ IMPL
10.8000 mg | DRUG_IMPLANT | Freq: Once | SUBCUTANEOUS | Status: AC
  Administered 2022-11-12: 10.8 mg via SUBCUTANEOUS
  Filled 2022-11-12: qty 10.8

## 2022-11-12 MED ORDER — ACETAMINOPHEN 325 MG PO TABS
650.0000 mg | ORAL_TABLET | Freq: Once | ORAL | Status: AC
  Administered 2022-11-12: 650 mg via ORAL
  Filled 2022-11-12: qty 2

## 2022-11-12 MED ORDER — TRASTUZUMAB-DKST CHEMO 150 MG IV SOLR
6.0000 mg/kg | Freq: Once | INTRAVENOUS | Status: AC
  Administered 2022-11-12: 420 mg via INTRAVENOUS
  Filled 2022-11-12: qty 20

## 2022-11-12 MED ORDER — SODIUM CHLORIDE 0.9 % IV SOLN
420.0000 mg | Freq: Once | INTRAVENOUS | Status: AC
  Administered 2022-11-12: 420 mg via INTRAVENOUS
  Filled 2022-11-12: qty 14

## 2022-11-12 NOTE — Patient Instructions (Signed)
[facility]  Discharge Instructions: Thank you for choosing [facility] to provide your oncology and hematology care.   If you have a lab appointment with the Cancer Center, please go directly to the Cancer Center and check in at the registration area.   Wear comfortable clothing and clothing appropriate for easy access to any Portacath or PICC line.   We strive to give you quality time with your provider. You may need to reschedule your appointment if you arrive late (15 or more minutes).  Arriving late affects you and other patients whose appointments are after yours.  Also, if you miss three or more appointments without notifying the office, you may be dismissed from the clinic at the provider's discretion.      For prescription refill requests, have your pharmacy contact our office and allow 72 hours for refills to be completed.    Today you received the following chemotherapy and/or immunotherapy agents: Trastuzumab and Pertuzumab      To help prevent nausea and vomiting after your treatment, we encourage you to take your nausea medication as directed.  BELOW ARE SYMPTOMS THAT SHOULD BE REPORTED IMMEDIATELY: *FEVER GREATER THAN 100.4 F (38 C) OR HIGHER *CHILLS OR SWEATING *NAUSEA AND VOMITING THAT IS NOT CONTROLLED WITH YOUR NAUSEA MEDICATION *UNUSUAL SHORTNESS OF BREATH *UNUSUAL BRUISING OR BLEEDING *URINARY PROBLEMS (pain or burning when urinating, or frequent urination) *BOWEL PROBLEMS (unusual diarrhea, constipation, pain near the anus) TENDERNESS IN MOUTH AND THROAT WITH OR WITHOUT PRESENCE OF ULCERS (sore throat, sores in mouth, or a toothache) UNUSUAL RASH, SWELLING OR PAIN  UNUSUAL VAGINAL DISCHARGE OR ITCHING   Items with * indicate a potential emergency and should be followed up as soon as possible or go to the Emergency Department if any problems should occur.  Please show the CHEMOTHERAPY ALERT CARD or IMMUNOTHERAPY  ALERT CARD at check-in to the Emergency Department and triage nurse.  Should you have questions after your visit or need to cancel or reschedule your appointment, please contact [facility]  Dept: [phone]  and follow the prompts.  Office hours are 8:00 a.m. to 4:30 p.m. Monday - Friday. Please note that voicemails left after 4:00 p.m. may not be returned until the following business day.  We are closed weekends and major holidays. You have access to a nurse at all times for urgent questions. Please call the main number to the clinic Dept: [phone] and follow the prompts.   For any non-urgent questions, you may also contact your provider using MyChart. We now offer e-Visits for anyone 18 and older to request care online for non-urgent symptoms. For details visit [email or web address].   Also download the MyChart app! Go to the app store, search "MyChart", open the app, select [facility], and log in with your MyChart username and password.   

## 2022-11-12 NOTE — Progress Notes (Signed)
Pt declined to stay for post 30 min obs, VSS and ambulatory to lobby upon discharge 

## 2022-11-12 NOTE — Assessment & Plan Note (Addendum)
Sherry Barry is a 52 year old woman with stage IV triple positive breast cancer currently on treatment with herceptin/perjeta, tamoxifen and zometa. She is doing really well so far, denies any major complaints except for some cramps in bilateral lower extremities and some rib pain which is mild. No concerns on exam. At this time there is no concern for breast cancer recurrence.  I reassured her that since she is going to the gym recently, she should focus on hydrating herself well.  With regards to the rib pain, this is nonreproducible, no tenderness.  She is not guarding that region.  Hence have asked her to monitor.  She will continue with the current treatment plan, return to clinic every 6 weeks for follow-up.  She should return to clinic every 3 weeks for Herceptin and Perjeta.  She gets Zometa and Zoladex every 3 months, no new dental concerns reported.

## 2022-11-12 NOTE — Progress Notes (Signed)
Trastuzumab dose rounded for vial size.  Anola Gurney [place], [place], BCPS, BCOP 11/12/2022 3:07 PM

## 2022-11-12 NOTE — Progress Notes (Signed)
[facility] Cancer Follow up:    Sherry Cipro, NP [address] [place] [address]   DIAGNOSIS:  Cancer Staging  Malignant neoplasm of upper-inner quadrant of right breast in female, estrogen receptor positive (HCC) Staging form: Breast, AJCC 7th Edition - Clinical: Stage IA (T1c, N0, cM0) - Unsigned Specimen type: Core Needle Biopsy Histopathologic type: 9931 Laterality: Right Staging comments: Staged at breast conference 1.15.14  - Pathologic stage from 07/14/2012: Stage IV (TX, NX, M1) - Signed by Rachel Moulds, MD on 08/28/2021 Specimen type: Core Needle Biopsy Histopathologic type: 9931 Laterality: Right   SUMMARY OF ONCOLOGIC HISTORY: Oncology History  Malignant neoplasm of upper-inner quadrant of right breast in female, estrogen receptor positive (HCC)  07/13/2012 Clinical Stage   Stage IA: T1c N0   07/14/2012 Definitive Surgery   Bilateral mastectomy/right SLNB: RIGHT invasive ductal carcinoma, grade 3, ER+, PR +, Her2/neu positive (ratio 3.02), Ki67 48%. DCIS. 1/4 LN positive for malignancy. LEFT: benign   07/14/2012 Pathologic Stage   Stage IIB: T2 N1a M0   07/14/2012 Cancer Staging   Staging form: Breast, AJCC 7th Edition - Pathologic stage from 07/14/2012: Stage IV (TX, NX, M1) - Signed by Rachel Moulds, MD on 08/28/2021 Specimen type: Core Needle Biopsy Histopathologic type: 9931 Laterality: Right   08/17/2012 - 08/30/2013 Chemotherapy   Adjuvant carboplatin, docetaxel, and trastuzumab x 6 cycles (completed 11/30/2012) followed by maintenance trastuzumab to total one year   11/2012 Procedure   Comp Cancer Gene panel (GeneDx) negative for deleterious mutations    - 02/2013 Radiation Therapy   Adjuvant RT to right breast   02/2013 -  Anti-estrogen oral therapy   Tamoxifen 20 mg daily   09/24/2013 Surgery   Bilateral breast reconstruction with latissmus flap and expander placement   12/12/2013 Surgery   Implant placement     Relapse/Recurrence   Left sided malignant pleural effusion.  Tumor cells are positive for GATA3 and ER, negative for TTF-1 consistent with recurrent breast carcinoma Prognostic showed ER 90% positive strong staining, PR 80% positive strong staining, HER2 negative.    09/16/2021 Imaging   PET scan showed malignant left pleural effusion with extensive areas of nodularity and hypermetabolic activity involving the mediastinal border and pericardium as well as the most inferior aspects of the costodiaphragmatic recess.  Signs of nodal disease at the thoracic inlet on the left suspect extension of disease below the diaphragm along the left anterolateral aorta.  Nonspecific moderate to markedly increased metabolic activity about the base of the tongue bilaterally.  Consider direct visualization showing mildly asymmetric uptake favoring the right lingual tonsil.  Sclerotic foci without marked increased metabolic activity suspicious for metastatic disease perhaps treated or questions.  Heterogeneous marrow uptake seen elsewhere generalized and nonspecific.  Consider spinal MRI is warranted  MRI brain without any evidence of intracranial metastatic disease.   10/09/2021 - 02/11/2022 Chemotherapy   Taxotere, Herceptin, Perjeta x 6   12/04/2021 Imaging   There is no evidence new metastatic disease. There is interval decrease in the left pleural effusion possibly suggesting resolving pleural metastatic disease. Few scattered sclerotic metastatic lesions in the skeletal structures have not changed significantly.   No acute findings are seen in the chest abdomen and pelvis.     02/22/2022 -  Antibody Plan   Herceptin/Perjeta every 3 weeks   03/21/2022 Imaging   CT chest abdomen pelvis  IMPRESSION: 1. No significant change since previous study. Again demonstrated is a chronic small left pleural effusion with basilar  atelectasis and scattered sclerotic skeletal metastases. 2. No acute abnormalities are  demonstrated. 3. Small esophageal hiatal hernia.     Electronically Signed   By: Burman Nieves M.D.   On: 03/21/2022 20:23     CURRENT THERAPY:Herceptin/Perjeta; tamoxifen  INTERVAL HISTORY:  Sherry Barry 52 y.o. female returns for f/u of her metastatic breast cancer prior to receiving Herceptin/Perjeta.  She is tolerating treatment moderately well.  Her most recent echocardiogram occurred on September 24, 2022 demonstrating a left ventricular ejection fraction of 55 to 60%.  She follows with Dr. Shirlee Latch in cardio oncology. According to Dr Shirlee Latch ok to continue herceptin.  Her most recent restaging CT chest abdomen pelvis showed no progression of disease back in January 2024.  Her next scan is expected December 29, 2022.  She has no concerns today except for some lower extremity cramps but she is she has started going to the gym recently.  She has also noticed some pain in the left posterior chest wall which is very mild and not as bothersome.  She is excited about a couple summer trips that are upcoming.  She is doing very well overall.  She is tolerating the combination of Herceptin Perjeta with tamoxifen very well.  She is also getting Zoladex every 3 months as well as Zometa every 3 months.  She denies any new dental concerns.  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  MEDICAL HISTORY: Past Medical History:  Diagnosis Date   Allergy    Breast cancer (HCC)    Breast cancer (HCC)    right   History of chemotherapy    doxetaxel/carboplatin/trastuzumab   History of migraine    last one about a week ago   Hx of radiation therapy 01/01/13- 02/15/13   r chest wall, r supraclav/axilla 5040 cGy/28 sessions, scar boost 1000 cGy/5 sessions   Hypertension    no meds,   urgent care on pomana   Migraine    Migraine     SURGICAL HISTORY: Past Surgical History:  Procedure Laterality Date   ABDOMINAL HYSTERECTOMY     no salpingo-oophorectomy 2009   BREAST RECONSTRUCTION WITH PLACEMENT OF TISSUE EXPANDER AND FLEX HD (ACELLULAR HYDRATED DERMIS) Left 09/24/2013   IR IMAGING GUIDED PORT INSERTION  10/07/2021   IR THORACENTESIS ASP PLEURAL SPACE W/IMG GUIDE  08/11/2021   IR THORACENTESIS ASP PLEURAL SPACE W/IMG GUIDE  10/07/2021   LATISSIMUS FLAP TO BREAST Right 09/24/2013   Procedure: RIGHT LATISSMUS MYOCUTAEIOUS MUSCLE FLAP AND PLACEMENT OF TISSUE Lurena Nida;  Surgeon: Wayland Denis, DO;  Location: [facility];  Service: Plastics;  Laterality: Right;   LIPOSUCTION WITH LIPOFILLING Bilateral 12/12/2013   Procedure: LIPOSUCTION WITH LIPOFILLING;  Surgeon: Wayland Denis, DO;  Location: [facility];  Service: Plastics;  Laterality: Bilateral;   PORT-A-CATH REMOVAL Left 12/12/2013   Procedure: REMOVAL PORT-A-CATH;  Surgeon: Wayland Denis, DO;  Location: [facility];  Service: Plastics;  Laterality: Left;   PORTACATH PLACEMENT  07/14/2012   Procedure: INSERTION PORT-A-CATH;  Surgeon: Clovis Pu. Cornett, MD;  Location: [facility];  Service: General;  Laterality: Left;   RECONSTRUCTION BREAST W/ LATISSIMUS DORSI FLAP Right 09/24/2013   "& tissue expander placement"   REMOVAL OF BILATERAL TISSUE EXPANDERS WITH PLACEMENT OF BILATERAL BREAST IMPLANTS Bilateral 12/12/2013   Procedure: REMOVAL OF BILATERAL TISSUE  EXPANDERS WITH PLACEMENT OF BILATERAL BREAST IMPLANTS/BILATERAL CAPSULECTOMIES WITH  LIPOFILLING FAT GRAFTING;  Surgeon: Wayland Denis, DO;  Location: [facility];  Service: Plastics;  Laterality: Bilateral;   SIMPLE MASTECTOMY WITH AXILLARY SENTINEL NODE BIOPSY  07/14/2012   Procedure: SIMPLE MASTECTOMY WITH AXILLARY SENTINEL NODE BIOPSY;  Surgeon: Clovis Pu. Cornett, MD;  Location: [facility];  Service: General;  Laterality: Right;  Bilateral simple mastectomy with port and right sebtibel lymph node mapping   SIMPLE MASTECTOMY WITH AXILLARY SENTINEL NODE BIOPSY  07/14/2012   Procedure: SIMPLE MASTECTOMY;  Surgeon: Clovis Pu. Cornett, MD;  Location: [facility];  Service: General;  Laterality: Left;   TISSUE EXPANDER PLACEMENT Left 09/24/2013   Procedure: PLACEMENT OF TISSUE EXPANDER AND FLEX HD TO LEFT BREAST;  Surgeon: Wayland Denis, DO;  Location: [facility];  Service: Plastics;  Laterality: Left;    SOCIAL HISTORY: Social History   Socioeconomic History   Marital status: Married    Spouse name: Not on file   Number of children: 2   Years of education: Not on file   Highest education level: Not on file  Occupational History    Employer: [employer]  Tobacco Use   Smoking status: Never   Smokeless tobacco: Never  Substance and Sexual Activity   Alcohol use: Yes    Comment: occasional   Drug use: No   Sexual activity: Yes    Birth control/protection: Surgical  Other Topics Concern   Not on file  Social History Narrative   Not on file   Social Determinants of Health   Financial Resource Strain: Not on file  Food Insecurity: Not on file  Transportation Needs: Not on file  Physical Activity: Not on file  Stress: Not on file  Social Connections: Not on file  Intimate Partner Violence: Not on file    FAMILY HISTORY: Family History  Problem Relation Age of Onset   Hypertension Mother    Alcohol abuse Mother    Heart disease Maternal Grandmother    Stroke Maternal  Grandfather    Colon cancer Maternal Aunt 55       alive at 28   Brain cancer Maternal Uncle 31       and lymphoma in early 48s; deceased   Brain cancer Maternal Uncle 60       deceased   Pancreatic cancer Maternal Uncle 45       alive at 73   Breast cancer Cousin 44       mat 1st cousin once removed through mat GF ; deceased   Breast cancer Maternal Aunt        great aunt through mat GF; dx at ? age    Review of Systems  Constitutional:  Negative for appetite change, chills, fatigue, fever and unexpected weight change.  HENT:   Negative for hearing loss, lump/mass and trouble swallowing.   Eyes:  Negative for eye problems and icterus.  Respiratory:  Negative for chest tightness, cough and shortness of breath.   Cardiovascular:  Negative for chest pain, leg swelling and  palpitations.  Gastrointestinal:  Negative for abdominal distention, abdominal pain, constipation, diarrhea, nausea and vomiting.  Endocrine: Negative for hot flashes.  Genitourinary:  Negative for difficulty urinating.   Musculoskeletal:  Negative for arthralgias.  Skin:  Negative for itching and rash.  Neurological:  Negative for dizziness, extremity weakness, headaches and numbness.  Hematological:  Negative for adenopathy. Does not bruise/bleed easily.  Psychiatric/Behavioral:  Negative for depression. The patient is not nervous/anxious.       PHYSICAL EXAMINATION    Vitals:   11/12/22 1340  BP: (!) 129/91  Pulse: 92  Resp: 16  Temp: 97.7 F (36.5 C)  SpO2: 100%    Physical Exam Constitutional:      General: She is not in acute distress.    Appearance: Normal appearance. She is not toxic-appearing.  HENT:     Head: Normocephalic and atraumatic.  Eyes:     General: No scleral icterus. Cardiovascular:     Rate and Rhythm: Normal rate and regular rhythm.     Pulses: Normal pulses.     Heart sounds: Normal heart sounds.  Pulmonary:     Effort: Pulmonary effort is normal.     Breath sounds:  Normal breath sounds.  Abdominal:     General: Abdomen is flat. Bowel sounds are normal. There is no distension.     Palpations: Abdomen is soft.     Tenderness: There is no abdominal tenderness.  Musculoskeletal:        General: No swelling.     Cervical back: Neck supple.  Lymphadenopathy:     Cervical: No cervical adenopathy.  Skin:    General: Skin is warm and dry.     Findings: No rash.  Neurological:     General: No focal deficit present.     Mental Status: She is alert.  Psychiatric:        Mood and Affect: Mood normal.        Behavior: Behavior normal.     LABORATORY DATA:  CBC    Component Value Date/Time   WBC 6.1 11/12/2022 1318   WBC 5.4 09/13/2022 1656   RBC 4.87 11/12/2022 1318   HGB 14.4 11/12/2022 1318   HGB 15.9 10/30/2019 1030   HGB 14.7 02/15/2017 0852   HCT 41.4 11/12/2022 1318   HCT 46.1 10/30/2019 1030   HCT 42.9 02/15/2017 0852   PLT 225 11/12/2022 1318   PLT 245 10/30/2019 1030   MCV 85.0 11/12/2022 1318   MCV 88 10/30/2019 1030   MCV 86.7 02/15/2017 0852   MCH 29.6 11/12/2022 1318   MCHC 34.8 11/12/2022 1318   RDW 12.3 11/12/2022 1318   RDW 12.5 10/30/2019 1030   RDW 12.6 02/15/2017 0852   LYMPHSABS 1.9 11/12/2022 1318   LYMPHSABS 1.4 02/15/2017 0852   MONOABS 0.4 11/12/2022 1318   MONOABS 0.3 02/15/2017 0852   EOSABS 0.1 11/12/2022 1318   EOSABS 0.1 02/15/2017 0852   BASOSABS 0.0 11/12/2022 1318   BASOSABS 0.0 02/15/2017 0852    CMP     Component Value Date/Time   NA 142 11/12/2022 1318   NA 140 10/30/2019 1030   NA 141 02/15/2017 0852   K 3.2 (L) 11/12/2022 1318   K 3.9 02/15/2017 0852   CL 107 11/12/2022 1318   CL 101 11/15/2012 0904   CO2 26 11/12/2022 1318   CO2 25 02/15/2017 0852   GLUCOSE 86 11/12/2022 1318   GLUCOSE 87 02/15/2017 0852   GLUCOSE 69 (L) 11/15/2012 0904   BUN  12 11/12/2022 1318   BUN 11 10/30/2019 1030   BUN 12.5 02/15/2017 0852   CREATININE 0.65 11/12/2022 1318   CREATININE 0.9 02/15/2017 0852    CALCIUM 8.9 11/12/2022 1318   CALCIUM 9.3 02/15/2017 0852   PROT 7.0 11/12/2022 1318   PROT 6.7 10/30/2019 1030   PROT 7.0 02/15/2017 0852   ALBUMIN 4.3 11/12/2022 1318   ALBUMIN 4.2 10/30/2019 1030   ALBUMIN 3.6 02/15/2017 0852   AST 17 11/12/2022 1318   AST 16 02/15/2017 0852   ALT 12 11/12/2022 1318   ALT 13 02/15/2017 0852   ALKPHOS 45 11/12/2022 1318   ALKPHOS 63 02/15/2017 0852   BILITOT 0.4 11/12/2022 1318   BILITOT 0.69 02/15/2017 0852   GFRNONAA >60 11/12/2022 1318   GFRAA 100 10/30/2019 1030       ASSESSMENT and THERAPY PLAN:   Malignant neoplasm of upper-inner quadrant of right breast in female, estrogen receptor positive (HCC) Sherry Barry is a 52 year old woman with stage IV triple positive breast cancer currently on treatment with herceptin/perjeta, tamoxifen and zometa. She is doing really well so far, denies any major complaints except for some cramps in bilateral lower extremities and some rib pain which is mild. No concerns on exam. At this time there is no concern for breast cancer recurrence.  I reassured her that since she is going to the gym recently, she should focus on hydrating herself well.  With regards to the rib pain, this is nonreproducible, no tenderness.  She is not guarding that region.  Hence have asked her to monitor.  She will continue with the current treatment plan, return to clinic every 6 weeks for follow-up.  She should return to clinic every 3 weeks for Herceptin and Perjeta.  She gets Zometa and Zoladex every 3 months, no new dental concerns reported.    All questions were answered. The patient knows to call the clinic with any problems, questions or concerns. We can certainly see the patient much sooner if necessary.  Total encounter time:30 minutes*in face-to-face visit time, chart review, lab review, care coordination, order entry, and documentation of the encounter time.  *Total Encounter Time as defined by the Centers for Medicare and  Medicaid Services includes, in addition to the face-to-face time of a patient visit (documented in the note above) non-face-to-face time: obtaining and reviewing outside history, ordering and reviewing medications, tests or procedures, care coordination (communications with other health care professionals or caregivers) and documentation in the medical record.

## 2022-11-15 ENCOUNTER — Telehealth: Payer: Self-pay | Admitting: Hematology and Oncology

## 2022-11-15 NOTE — Telephone Encounter (Signed)
Spoke with patient confirming upcoming appointment  

## 2022-11-16 ENCOUNTER — Other Ambulatory Visit: Payer: Self-pay

## 2022-11-26 ENCOUNTER — Other Ambulatory Visit: Payer: Self-pay

## 2022-12-03 ENCOUNTER — Other Ambulatory Visit: Payer: Self-pay

## 2022-12-03 ENCOUNTER — Inpatient Hospital Stay: Payer: Federal, State, Local not specified - PPO | Attending: Hematology and Oncology

## 2022-12-03 VITALS — BP 122/91 | HR 95 | Temp 98.4°F | Resp 16

## 2022-12-03 DIAGNOSIS — C7951 Secondary malignant neoplasm of bone: Secondary | ICD-10-CM | POA: Insufficient documentation

## 2022-12-03 DIAGNOSIS — Z5112 Encounter for antineoplastic immunotherapy: Secondary | ICD-10-CM | POA: Diagnosis not present

## 2022-12-03 DIAGNOSIS — Z803 Family history of malignant neoplasm of breast: Secondary | ICD-10-CM | POA: Insufficient documentation

## 2022-12-03 DIAGNOSIS — Z800 Family history of malignant neoplasm of digestive organs: Secondary | ICD-10-CM | POA: Diagnosis not present

## 2022-12-03 DIAGNOSIS — Z79810 Long term (current) use of selective estrogen receptor modulators (SERMs): Secondary | ICD-10-CM | POA: Insufficient documentation

## 2022-12-03 DIAGNOSIS — C50211 Malignant neoplasm of upper-inner quadrant of right female breast: Secondary | ICD-10-CM | POA: Insufficient documentation

## 2022-12-03 DIAGNOSIS — Z170 Estrogen receptor positive status [ER+]: Secondary | ICD-10-CM | POA: Insufficient documentation

## 2022-12-03 MED ORDER — SODIUM CHLORIDE 0.9 % IV SOLN: Freq: Once | INTRAVENOUS | Status: AC

## 2022-12-03 MED ORDER — SODIUM CHLORIDE 0.9 % IV SOLN
420.0000 mg | Freq: Once | INTRAVENOUS | Status: AC
  Administered 2022-12-03: 420 mg via INTRAVENOUS
  Filled 2022-12-03: qty 14

## 2022-12-03 MED ORDER — DIPHENHYDRAMINE HCL 25 MG PO CAPS
25.0000 mg | ORAL_CAPSULE | Freq: Once | ORAL | Status: AC
  Administered 2022-12-03: 25 mg via ORAL
  Filled 2022-12-03: qty 1

## 2022-12-03 MED ORDER — HEPARIN SOD (PORK) LOCK FLUSH 100 UNIT/ML IV SOLN
500.0000 [IU] | Freq: Once | INTRAVENOUS | Status: AC | PRN
  Administered 2022-12-03: 500 [IU]

## 2022-12-03 MED ORDER — SODIUM CHLORIDE 0.9% FLUSH
10.0000 mL | INTRAVENOUS | Status: DC | PRN
  Administered 2022-12-03: 10 mL

## 2022-12-03 MED ORDER — TRASTUZUMAB-DKST CHEMO 150 MG IV SOLR
6.0000 mg/kg | Freq: Once | INTRAVENOUS | Status: AC
  Administered 2022-12-03: 378 mg via INTRAVENOUS
  Filled 2022-12-03: qty 18

## 2022-12-03 MED ORDER — ACETAMINOPHEN 325 MG PO TABS
650.0000 mg | ORAL_TABLET | Freq: Once | ORAL | Status: AC
  Administered 2022-12-03: 650 mg via ORAL
  Filled 2022-12-03: qty 2

## 2022-12-03 NOTE — Progress Notes (Signed)
Per Dr Al Pimple, ok to use most recent weight for Trastuzumab dose.  Ebony Hail, Pharm.D., CPP 12/03/2022@10 :51 AM

## 2022-12-03 NOTE — Patient Instructions (Addendum)
[facility]  Discharge Instructions: Thank you for choosing [facility] to provide your oncology and hematology care.   If you have a lab appointment with the Cancer Center, please go directly to the Cancer Center and check in at the registration area.   Wear comfortable clothing and clothing appropriate for easy access to any Portacath or PICC line.   We strive to give you quality time with your provider. You may need to reschedule your appointment if you arrive late (15 or more minutes).  Arriving late affects you and other patients whose appointments are after yours.  Also, if you miss three or more appointments without notifying the office, you may be dismissed from the clinic at the provider's discretion.      For prescription refill requests, have your pharmacy contact our office and allow 72 hours for refills to be completed.    Today you received the following chemotherapy and/or immunotherapy agent: Ogivri, Perjeta      To help prevent nausea and vomiting after your treatment, we encourage you to take your nausea medication as directed.  BELOW ARE SYMPTOMS THAT SHOULD BE REPORTED IMMEDIATELY: *FEVER GREATER THAN 100.4 F (38 C) OR HIGHER *CHILLS OR SWEATING *NAUSEA AND VOMITING THAT IS NOT CONTROLLED WITH YOUR NAUSEA MEDICATION *UNUSUAL SHORTNESS OF BREATH *UNUSUAL BRUISING OR BLEEDING *URINARY PROBLEMS (pain or burning when urinating, or frequent urination) *BOWEL PROBLEMS (unusual diarrhea, constipation, pain near the anus) TENDERNESS IN MOUTH AND THROAT WITH OR WITHOUT PRESENCE OF ULCERS (sore throat, sores in mouth, or a toothache) UNUSUAL RASH, SWELLING OR PAIN  UNUSUAL VAGINAL DISCHARGE OR ITCHING   Items with * indicate a potential emergency and should be followed up as soon as possible or go to the Emergency Department if any problems should occur.  Please show the CHEMOTHERAPY ALERT CARD or IMMUNOTHERAPY ALERT CARD at  check-in to the Emergency Department and triage nurse.  Should you have questions after your visit or need to cancel or reschedule your appointment, please contact [facility]  Dept: [phone]  and follow the prompts.  Office hours are 8:00 a.m. to 4:30 p.m. Monday - Friday. Please note that voicemails left after 4:00 p.m. may not be returned until the following business day.  We are closed weekends and major holidays. You have access to a nurse at all times for urgent questions. Please call the main number to the clinic Dept: [phone] and follow the prompts.   For any non-urgent questions, you may also contact your provider using MyChart. We now offer e-Visits for anyone 37 and older to request care online for non-urgent symptoms. For details visit [email or web address].   Also download the MyChart app! Go to the app store, search "MyChart", open the app, select [facility], and log in with your MyChart username and password.

## 2022-12-03 NOTE — Progress Notes (Signed)
1300 Pt does not want to wait for 30 minute post-observation period following treatment today. She is a/o and without c/o, ambulatory upon d/c.

## 2022-12-24 ENCOUNTER — Inpatient Hospital Stay: Payer: Federal, State, Local not specified - PPO | Attending: Hematology and Oncology

## 2022-12-24 VITALS — BP 141/104 | HR 88 | Resp 16 | Wt 140.8 lb

## 2022-12-24 DIAGNOSIS — Z808 Family history of malignant neoplasm of other organs or systems: Secondary | ICD-10-CM | POA: Diagnosis not present

## 2022-12-24 DIAGNOSIS — Z5112 Encounter for antineoplastic immunotherapy: Secondary | ICD-10-CM | POA: Insufficient documentation

## 2022-12-24 DIAGNOSIS — Z9013 Acquired absence of bilateral breasts and nipples: Secondary | ICD-10-CM | POA: Insufficient documentation

## 2022-12-24 DIAGNOSIS — Z90710 Acquired absence of both cervix and uterus: Secondary | ICD-10-CM | POA: Diagnosis not present

## 2022-12-24 DIAGNOSIS — Z170 Estrogen receptor positive status [ER+]: Secondary | ICD-10-CM | POA: Diagnosis not present

## 2022-12-24 DIAGNOSIS — C7951 Secondary malignant neoplasm of bone: Secondary | ICD-10-CM | POA: Insufficient documentation

## 2022-12-24 DIAGNOSIS — Z79810 Long term (current) use of selective estrogen receptor modulators (SERMs): Secondary | ICD-10-CM | POA: Insufficient documentation

## 2022-12-24 DIAGNOSIS — Z807 Family history of other malignant neoplasms of lymphoid, hematopoietic and related tissues: Secondary | ICD-10-CM | POA: Diagnosis not present

## 2022-12-24 DIAGNOSIS — C50211 Malignant neoplasm of upper-inner quadrant of right female breast: Secondary | ICD-10-CM | POA: Insufficient documentation

## 2022-12-24 DIAGNOSIS — Z9221 Personal history of antineoplastic chemotherapy: Secondary | ICD-10-CM | POA: Insufficient documentation

## 2022-12-24 DIAGNOSIS — Z803 Family history of malignant neoplasm of breast: Secondary | ICD-10-CM | POA: Insufficient documentation

## 2022-12-24 DIAGNOSIS — Z923 Personal history of irradiation: Secondary | ICD-10-CM | POA: Insufficient documentation

## 2022-12-24 DIAGNOSIS — Z800 Family history of malignant neoplasm of digestive organs: Secondary | ICD-10-CM | POA: Diagnosis not present

## 2022-12-24 MED ORDER — ACETAMINOPHEN 325 MG PO TABS
650.0000 mg | ORAL_TABLET | Freq: Once | ORAL | Status: AC
  Administered 2022-12-24: 650 mg via ORAL
  Filled 2022-12-24: qty 2

## 2022-12-24 MED ORDER — SODIUM CHLORIDE 0.9 % IV SOLN: Freq: Once | INTRAVENOUS | Status: AC

## 2022-12-24 MED ORDER — HEPARIN SOD (PORK) LOCK FLUSH 100 UNIT/ML IV SOLN
500.0000 [IU] | Freq: Once | INTRAVENOUS | Status: AC | PRN
  Administered 2022-12-24: 500 [IU]

## 2022-12-24 MED ORDER — DIPHENHYDRAMINE HCL 25 MG PO CAPS
25.0000 mg | ORAL_CAPSULE | Freq: Once | ORAL | Status: AC
  Administered 2022-12-24: 25 mg via ORAL
  Filled 2022-12-24: qty 1

## 2022-12-24 MED ORDER — SODIUM CHLORIDE 0.9% FLUSH
10.0000 mL | INTRAVENOUS | Status: DC | PRN
  Administered 2022-12-24: 10 mL

## 2022-12-24 MED ORDER — SODIUM CHLORIDE 0.9 % IV SOLN
420.0000 mg | Freq: Once | INTRAVENOUS | Status: AC
  Administered 2022-12-24: 420 mg via INTRAVENOUS
  Filled 2022-12-24: qty 14

## 2022-12-24 MED ORDER — TRASTUZUMAB-DKST CHEMO 150 MG IV SOLR
6.0000 mg/kg | Freq: Once | INTRAVENOUS | Status: AC
  Administered 2022-12-24: 378 mg via INTRAVENOUS
  Filled 2022-12-24: qty 18

## 2022-12-24 NOTE — Progress Notes (Signed)
Per Dr. Al Pimple - okay to proceed with treatment with elevated BP of 134/96.

## 2022-12-24 NOTE — Patient Instructions (Signed)
[facility]   Discharge Instructions: Thank you for choosing [facility] to provide your oncology and hematology care.   If you have a lab appointment with the Cancer Center, please go directly to the Cancer Center and check in at the registration area.   Wear comfortable clothing and clothing appropriate for easy access to any Portacath or PICC line.   We strive to give you quality time with your provider. You may need to reschedule your appointment if you arrive late (15 or more minutes).  Arriving late affects you and other patients whose appointments are after yours.  Also, if you miss three or more appointments without notifying the office, you may be dismissed from the clinic at the provider's discretion.      For prescription refill requests, have your pharmacy contact our office and allow 72 hours for refills to be completed.    Today you received the following chemotherapy and/or immunotherapy agents: Trastuzumab (Herceptin) and Pertuzumab (Perjeta)       To help prevent nausea and vomiting after your treatment, we encourage you to take your nausea medication as directed.  BELOW ARE SYMPTOMS THAT SHOULD BE REPORTED IMMEDIATELY: *FEVER GREATER THAN 100.4 F (38 C) OR HIGHER *CHILLS OR SWEATING *NAUSEA AND VOMITING THAT IS NOT CONTROLLED WITH YOUR NAUSEA MEDICATION *UNUSUAL SHORTNESS OF BREATH *UNUSUAL BRUISING OR BLEEDING *URINARY PROBLEMS (pain or burning when urinating, or frequent urination) *BOWEL PROBLEMS (unusual diarrhea, constipation, pain near the anus) TENDERNESS IN MOUTH AND THROAT WITH OR WITHOUT PRESENCE OF ULCERS (sore throat, sores in mouth, or a toothache) UNUSUAL RASH, SWELLING OR PAIN  UNUSUAL VAGINAL DISCHARGE OR ITCHING   Items with * indicate a potential emergency and should be followed up as soon as possible or go to the Emergency Department if any problems should occur.  Please show the CHEMOTHERAPY ALERT  CARD or IMMUNOTHERAPY ALERT CARD at check-in to the Emergency Department and triage nurse.  Should you have questions after your visit or need to cancel or reschedule your appointment, please contact [facility]  Dept: [phone]  and follow the prompts.  Office hours are 8:00 a.m. to 4:30 p.m. Monday - Friday. Please note that voicemails left after 4:00 p.m. may not be returned until the following business day.  We are closed weekends and major holidays. You have access to a nurse at all times for urgent questions. Please call the main number to the clinic Dept: [phone] and follow the prompts.   For any non-urgent questions, you may also contact your provider using MyChart. We now offer e-Visits for anyone 18 and older to request care online for non-urgent symptoms. For details visit [email or web address].   Also download the MyChart app! Go to the app store, search "MyChart", open the app, select [facility], and log in with your MyChart username and password.   

## 2022-12-24 NOTE — Progress Notes (Signed)
Patient declined to stay for 30 minute post Perjeta observation.

## 2022-12-29 ENCOUNTER — Ambulatory Visit (HOSPITAL_COMMUNITY)
Admission: RE | Admit: 2022-12-29 | Discharge: 2022-12-29 | Disposition: A | Discharge status: Home / Self Care | Payer: Federal, State, Local not specified - PPO | Source: Ambulatory Visit | Attending: Hematology and Oncology | Admitting: Hematology and Oncology

## 2022-12-29 ENCOUNTER — Encounter (HOSPITAL_COMMUNITY): Payer: Self-pay

## 2022-12-29 ENCOUNTER — Other Ambulatory Visit: Payer: Self-pay | Admitting: *Deleted

## 2022-12-29 DIAGNOSIS — C50211 Malignant neoplasm of upper-inner quadrant of right female breast: Secondary | ICD-10-CM | POA: Insufficient documentation

## 2022-12-29 DIAGNOSIS — Z170 Estrogen receptor positive status [ER+]: Secondary | ICD-10-CM | POA: Diagnosis not present

## 2022-12-29 DIAGNOSIS — C7951 Secondary malignant neoplasm of bone: Secondary | ICD-10-CM | POA: Diagnosis not present

## 2022-12-29 DIAGNOSIS — K5730 Diverticulosis of large intestine without perforation or abscess without bleeding: Secondary | ICD-10-CM | POA: Diagnosis not present

## 2022-12-29 DIAGNOSIS — K521 Toxic gastroenteritis and colitis: Secondary | ICD-10-CM | POA: Insufficient documentation

## 2022-12-29 DIAGNOSIS — T451X5A Adverse effect of antineoplastic and immunosuppressive drugs, initial encounter: Secondary | ICD-10-CM | POA: Diagnosis not present

## 2022-12-29 LAB — POCT I-STAT CREATININE: Creatinine, Ser: 0.7 mg/dL (ref 0.44–1.00)

## 2022-12-29 MED ORDER — IOHEXOL 300 MG/ML  SOLN
100.0000 mL | Freq: Once | INTRAMUSCULAR | Status: AC | PRN
  Administered 2022-12-29: 100 mL via INTRAVENOUS

## 2022-12-29 MED ORDER — SODIUM CHLORIDE (PF) 0.9 % IJ SOLN
INTRAMUSCULAR | Status: AC
  Filled 2022-12-29: qty 50

## 2022-12-30 ENCOUNTER — Inpatient Hospital Stay: Payer: Federal, State, Local not specified - PPO | Admitting: Hematology and Oncology

## 2022-12-30 ENCOUNTER — Inpatient Hospital Stay: Payer: Federal, State, Local not specified - PPO

## 2022-12-30 ENCOUNTER — Other Ambulatory Visit: Payer: Self-pay

## 2022-12-30 VITALS — BP 135/99 | HR 102 | Temp 97.9°F | Resp 16 | Wt 135.9 lb

## 2022-12-30 DIAGNOSIS — Z9013 Acquired absence of bilateral breasts and nipples: Secondary | ICD-10-CM | POA: Diagnosis not present

## 2022-12-30 DIAGNOSIS — C7951 Secondary malignant neoplasm of bone: Secondary | ICD-10-CM | POA: Diagnosis not present

## 2022-12-30 DIAGNOSIS — Z79810 Long term (current) use of selective estrogen receptor modulators (SERMs): Secondary | ICD-10-CM | POA: Diagnosis not present

## 2022-12-30 DIAGNOSIS — Z923 Personal history of irradiation: Secondary | ICD-10-CM | POA: Diagnosis not present

## 2022-12-30 DIAGNOSIS — Z807 Family history of other malignant neoplasms of lymphoid, hematopoietic and related tissues: Secondary | ICD-10-CM | POA: Diagnosis not present

## 2022-12-30 DIAGNOSIS — C50211 Malignant neoplasm of upper-inner quadrant of right female breast: Secondary | ICD-10-CM

## 2022-12-30 DIAGNOSIS — Z803 Family history of malignant neoplasm of breast: Secondary | ICD-10-CM | POA: Diagnosis not present

## 2022-12-30 DIAGNOSIS — Z9221 Personal history of antineoplastic chemotherapy: Secondary | ICD-10-CM | POA: Diagnosis not present

## 2022-12-30 DIAGNOSIS — Z170 Estrogen receptor positive status [ER+]: Secondary | ICD-10-CM | POA: Diagnosis not present

## 2022-12-30 DIAGNOSIS — Z808 Family history of malignant neoplasm of other organs or systems: Secondary | ICD-10-CM | POA: Diagnosis not present

## 2022-12-30 DIAGNOSIS — Z5112 Encounter for antineoplastic immunotherapy: Secondary | ICD-10-CM | POA: Diagnosis not present

## 2022-12-30 DIAGNOSIS — Z800 Family history of malignant neoplasm of digestive organs: Secondary | ICD-10-CM | POA: Diagnosis not present

## 2022-12-30 DIAGNOSIS — Z90710 Acquired absence of both cervix and uterus: Secondary | ICD-10-CM | POA: Diagnosis not present

## 2022-12-30 LAB — CMP (CANCER CENTER ONLY)
ALT: 14 U/L (ref 0–44)
AST: 19 U/L (ref 15–41)
Albumin: 4.5 g/dL (ref 3.5–5.0)
Alkaline Phosphatase: 50 U/L (ref 38–126)
Anion gap: 9 (ref 5–15)
BUN: 11 mg/dL (ref 6–20)
CO2: 26 mmol/L (ref 22–32)
Calcium: 9.3 mg/dL (ref 8.9–10.3)
Chloride: 106 mmol/L (ref 98–111)
Creatinine: 0.64 mg/dL (ref 0.44–1.00)
GFR, Estimated: >60 mL/min (ref >60)
Glucose, Bld: 80 mg/dL (ref 70–99)
Potassium: 3.3 mmol/L — ABNORMAL LOW (ref 3.5–5.1)
Sodium: 141 mmol/L (ref 135–145)
Total Bilirubin: 0.5 mg/dL (ref 0.3–1.2)
Total Protein: 7.5 g/dL (ref 6.5–8.1)

## 2022-12-30 LAB — CBC WITH DIFFERENTIAL (CANCER CENTER ONLY)
Abs Immature Granulocytes: 0.02 10*3/uL (ref 0.00–0.07)
Basophils Absolute: 0 10*3/uL (ref 0.0–0.1)
Basophils Relative: 1 %
Eosinophils Absolute: 0.1 10*3/uL (ref 0.0–0.5)
Eosinophils Relative: 2 %
HCT: 42.3 % (ref 36.0–46.0)
Hemoglobin: 14.5 g/dL (ref 12.0–15.0)
Immature Granulocytes: 0 %
Lymphocytes Relative: 34 %
Lymphs Abs: 2.5 10*3/uL (ref 0.7–4.0)
MCH: 29.1 pg (ref 26.0–34.0)
MCHC: 34.3 g/dL (ref 30.0–36.0)
MCV: 84.9 fL (ref 80.0–100.0)
Monocytes Absolute: 0.5 10*3/uL (ref 0.1–1.0)
Monocytes Relative: 6 %
Neutro Abs: 4.1 10*3/uL (ref 1.7–7.7)
Neutrophils Relative %: 57 %
Platelet Count: 258 10*3/uL (ref 150–400)
RBC: 4.98 MIL/uL (ref 3.87–5.11)
RDW: 12.9 % (ref 11.5–15.5)
WBC Count: 7.3 10*3/uL (ref 4.0–10.5)
nRBC: 0 % (ref 0.0–0.2)

## 2022-12-30 NOTE — Progress Notes (Signed)
[facility] Cancer Follow up:    Sherry Cipro, NP [address] [place] [address]   DIAGNOSIS:  Cancer Staging  Malignant neoplasm of upper-inner quadrant of right breast in female, estrogen receptor positive (HCC) Staging form: Breast, AJCC 7th Edition - Clinical: Stage IA (T1c, N0, cM0) - Unsigned Specimen type: Core Needle Biopsy Histopathologic type: 9931 Laterality: Right Staging comments: Staged at breast conference 1.15.14  - Pathologic stage from 07/14/2012: Stage IV (TX, NX, M1) - Signed by Rachel Moulds, MD on 08/28/2021 Specimen type: Core Needle Biopsy Histopathologic type: 9931 Laterality: Right   SUMMARY OF ONCOLOGIC HISTORY: Oncology History  Malignant neoplasm of upper-inner quadrant of right breast in female, estrogen receptor positive (HCC)  07/13/2012 Clinical Stage   Stage IA: T1c N0   07/14/2012 Definitive Surgery   Bilateral mastectomy/right SLNB: RIGHT invasive ductal carcinoma, grade 3, ER+, PR +, Her2/neu positive (ratio 3.02), Ki67 48%. DCIS. 1/4 LN positive for malignancy. LEFT: benign   07/14/2012 Pathologic Stage   Stage IIB: T2 N1a M0   07/14/2012 Cancer Staging   Staging form: Breast, AJCC 7th Edition - Pathologic stage from 07/14/2012: Stage IV (TX, NX, M1) - Signed by Rachel Moulds, MD on 08/28/2021 Specimen type: Core Needle Biopsy Histopathologic type: 9931 Laterality: Right   08/17/2012 - 08/30/2013 Chemotherapy   Adjuvant carboplatin, docetaxel, and trastuzumab x 6 cycles (completed 11/30/2012) followed by maintenance trastuzumab to total one year   11/2012 Procedure   Comp Cancer Gene panel (GeneDx) negative for deleterious mutations    - 02/2013 Radiation Therapy   Adjuvant RT to right breast   02/2013 -  Anti-estrogen oral therapy   Tamoxifen 20 mg daily   09/24/2013 Surgery   Bilateral breast reconstruction with latissmus flap and expander placement   12/12/2013 Surgery   Implant placement     Relapse/Recurrence   Left sided malignant pleural effusion.  Tumor cells are positive for GATA3 and ER, negative for TTF-1 consistent with recurrent breast carcinoma Prognostic showed ER 90% positive strong staining, PR 80% positive strong staining, HER2 negative.    09/16/2021 Imaging   PET scan showed malignant left pleural effusion with extensive areas of nodularity and hypermetabolic activity involving the mediastinal border and pericardium as well as the most inferior aspects of the costodiaphragmatic recess.  Signs of nodal disease at the thoracic inlet on the left suspect extension of disease below the diaphragm along the left anterolateral aorta.  Nonspecific moderate to markedly increased metabolic activity about the base of the tongue bilaterally.  Consider direct visualization showing mildly asymmetric uptake favoring the right lingual tonsil.  Sclerotic foci without marked increased metabolic activity suspicious for metastatic disease perhaps treated or questions.  Heterogeneous marrow uptake seen elsewhere generalized and nonspecific.  Consider spinal MRI is warranted  MRI brain without any evidence of intracranial metastatic disease.   10/09/2021 - 02/11/2022 Chemotherapy   Taxotere, Herceptin, Perjeta x 6   12/04/2021 Imaging   There is no evidence new metastatic disease. There is interval decrease in the left pleural effusion possibly suggesting resolving pleural metastatic disease. Few scattered sclerotic metastatic lesions in the skeletal structures have not changed significantly.   No acute findings are seen in the chest abdomen and pelvis.     02/22/2022 -  Antibody Plan   Herceptin/Perjeta every 3 weeks   03/21/2022 Imaging   CT chest abdomen pelvis  IMPRESSION: 1. No significant change since previous study. Again demonstrated is a chronic small left pleural effusion with basilar  atelectasis and scattered sclerotic skeletal metastases. 2. No acute abnormalities are  demonstrated. 3. Small esophageal hiatal hernia.     Electronically Signed   By: Burman Nieves M.D.   On: 03/21/2022 20:23     CURRENT THERAPY:Herceptin/Perjeta; tamoxifen  INTERVAL HISTORY:  Sherry Barry 52 y.o. female returns for f/u of her metastatic breast cancer prior to receiving Herceptin/Perjeta.  She is tolerating treatment moderately well.  Her most recent echocardiogram occurred on September 24, 2022 demonstrating a left ventricular ejection fraction of 55 to 60%.  She follows with Dr. Shirlee Latch in cardio oncology she had CT imaging yesterday, pending read.  She is doing quite well otherwise.  She is on Zoladex for ovarian suppression along with tamoxifen.  She takes Herceptin and Perjeta as well as Zometa for her metastatic disease.  No new complaints.  She is very excited about her upcoming vacation to [place] with her girlfriends.  She notices some vaginal dryness likely related to the Zoladex, otherwise has been working to lose weight and has been successful.  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  MEDICAL HISTORY: Past Medical History:  Diagnosis Date   Allergy    Breast cancer (HCC)    Breast cancer (HCC)    right   History of  chemotherapy    doxetaxel/carboplatin/trastuzumab   History of migraine    last one about a week ago   Hx of radiation therapy 01/01/13- 02/15/13   r chest wall, r supraclav/axilla 5040 cGy/28 sessions, scar boost 1000 cGy/5 sessions   Hypertension    no meds,   urgent care on pomana   Migraine    Migraine     SURGICAL HISTORY: Past Surgical History:  Procedure Laterality Date   ABDOMINAL HYSTERECTOMY     no salpingo-oophorectomy 2009   BREAST RECONSTRUCTION WITH PLACEMENT OF TISSUE EXPANDER AND FLEX HD (ACELLULAR HYDRATED DERMIS) Left 09/24/2013   IR IMAGING GUIDED PORT INSERTION  10/07/2021   IR THORACENTESIS ASP PLEURAL SPACE W/IMG GUIDE  08/11/2021   IR THORACENTESIS ASP PLEURAL SPACE W/IMG GUIDE  10/07/2021   LATISSIMUS FLAP TO BREAST Right 09/24/2013   Procedure: RIGHT LATISSMUS MYOCUTAEIOUS MUSCLE FLAP AND PLACEMENT OF TISSUE Lurena Nida;  Surgeon: Wayland Denis, DO;  Location: [facility];  Service: Plastics;  Laterality: Right;   LIPOSUCTION WITH LIPOFILLING Bilateral 12/12/2013   Procedure: LIPOSUCTION WITH LIPOFILLING;  Surgeon: Wayland Denis, DO;  Location: [facility];  Service: Plastics;  Laterality: Bilateral;   PORT-A-CATH REMOVAL Left 12/12/2013   Procedure: REMOVAL PORT-A-CATH;  Surgeon: Wayland Denis, DO;  Location: [facility];  Service: Plastics;  Laterality: Left;   PORTACATH PLACEMENT  07/14/2012   Procedure: INSERTION PORT-A-CATH;  Surgeon: Clovis Pu. Cornett, MD;  Location: [facility];  Service: General;  Laterality: Left;   RECONSTRUCTION BREAST W/ LATISSIMUS DORSI FLAP Right  09/24/2013   "& tissue expander placement"   REMOVAL OF BILATERAL TISSUE EXPANDERS WITH PLACEMENT OF BILATERAL BREAST IMPLANTS Bilateral 12/12/2013   Procedure: REMOVAL OF BILATERAL TISSUE EXPANDERS WITH PLACEMENT OF BILATERAL BREAST IMPLANTS/BILATERAL CAPSULECTOMIES WITH  LIPOFILLING FAT GRAFTING;  Surgeon: Wayland Denis, DO;  Location: [facility];  Service: Plastics;   Laterality: Bilateral;   SIMPLE MASTECTOMY WITH AXILLARY SENTINEL NODE BIOPSY  07/14/2012   Procedure: SIMPLE MASTECTOMY WITH AXILLARY SENTINEL NODE BIOPSY;  Surgeon: Clovis Pu. Cornett, MD;  Location: [facility];  Service: General;  Laterality: Right;  Bilateral simple mastectomy with port and right sebtibel lymph node mapping   SIMPLE MASTECTOMY WITH AXILLARY SENTINEL NODE BIOPSY  07/14/2012   Procedure: SIMPLE MASTECTOMY;  Surgeon: Clovis Pu. Cornett, MD;  Location: [facility];  Service: General;  Laterality: Left;   TISSUE EXPANDER PLACEMENT Left 09/24/2013   Procedure: PLACEMENT OF TISSUE EXPANDER AND FLEX HD TO LEFT BREAST;  Surgeon: Wayland Denis, DO;  Location: [facility];  Service: Plastics;  Laterality: Left;    SOCIAL HISTORY: Social History   Socioeconomic History   Marital status: Married    Spouse name: Not on file   Number of children: 2   Years of education: Not on file   Highest education level: Not on file  Occupational History    Employer: [employer]  Tobacco Use   Smoking status: Never   Smokeless tobacco: Never  Substance and Sexual Activity   Alcohol use: Yes    Comment: occasional   Drug use: No   Sexual activity: Yes    Birth control/protection: Surgical  Other Topics Concern   Not on file  Social History Narrative   Not on file   Social Determinants of Health   Financial Resource Strain: Not on file  Food Insecurity: Not on file  Transportation Needs: Not on file  Physical Activity: Not on file  Stress: Not on file  Social Connections: Not on file  Intimate Partner Violence: Not on file    FAMILY HISTORY: Family History  Problem Relation Age of Onset   Hypertension Mother    Alcohol abuse Mother    Heart disease Maternal Grandmother    Stroke Maternal Grandfather    Colon cancer Maternal Aunt 55       alive at 4   Brain cancer Maternal Uncle 62       and lymphoma in early 27s; deceased   Brain cancer Maternal Uncle 60       deceased   Pancreatic  cancer Maternal Uncle 45       alive at 65   Breast cancer Cousin 85       mat 1st cousin once removed through mat GF ; deceased   Breast cancer Maternal Aunt        great aunt through mat GF; dx at ? age    Review of Systems  Constitutional:  Negative for appetite change, chills, fatigue, fever and unexpected weight change.  HENT:   Negative for hearing loss, lump/mass and trouble swallowing.   Eyes:  Negative for eye problems and icterus.  Respiratory:  Negative for chest tightness, cough and shortness of breath.   Cardiovascular:  Negative for chest pain, leg swelling and palpitations.  Gastrointestinal:  Negative for abdominal distention, abdominal pain, constipation, diarrhea, nausea and vomiting.  Endocrine: Negative for hot flashes.  Genitourinary:  Negative for difficulty urinating.   Musculoskeletal:  Negative for arthralgias.  Skin:  Negative for itching and rash.  Neurological:  Negative for dizziness, extremity weakness, headaches and numbness.  Hematological:  Negative for adenopathy. Does not bruise/bleed easily.  Psychiatric/Behavioral:  Negative for depression. The patient is not nervous/anxious.       PHYSICAL EXAMINATION    Vitals:   12/30/22 1306  BP: (!) 135/99  Pulse: (!) 102  Resp: 16  Temp: 97.9 F (36.6 C)  SpO2: 99%    Physical Exam Constitutional:      General: She is not in acute distress.    Appearance: Normal appearance. She is not toxic-appearing.  HENT:     Head: Normocephalic and atraumatic.  Eyes:     General: No scleral icterus. Cardiovascular:     Rate and Rhythm: Normal rate and regular rhythm.     Pulses: Normal pulses.     Heart sounds: Normal heart sounds.  Pulmonary:     Effort: Pulmonary effort is normal.     Breath sounds: Normal breath sounds.  Abdominal:     General: Abdomen is flat. Bowel sounds are normal. There is no distension.     Palpations: Abdomen is soft.     Tenderness: There is no abdominal tenderness.   Musculoskeletal:        General: No swelling.     Cervical back: Neck supple.  Lymphadenopathy:     Cervical: No cervical adenopathy.  Skin:    General: Skin is warm and dry.     Findings: No rash.  Neurological:     General: No focal deficit present.     Mental Status: She is alert.  Psychiatric:        Mood and Affect: Mood normal.        Behavior: Behavior normal.     LABORATORY DATA:  CBC    Component Value Date/Time   WBC 7.3 12/30/2022 1237   WBC 5.4 09/13/2022 1656   RBC 4.98 12/30/2022 1237   HGB 14.5 12/30/2022 1237   HGB 15.9 10/30/2019 1030   HGB 14.7 02/15/2017 0852   HCT 42.3 12/30/2022 1237   HCT 46.1 10/30/2019 1030   HCT 42.9 02/15/2017 0852   PLT 258 12/30/2022 1237   PLT 245 10/30/2019 1030   MCV 84.9 12/30/2022 1237   MCV 88 10/30/2019 1030   MCV 86.7 02/15/2017 0852   MCH 29.1 12/30/2022 1237   MCHC 34.3 12/30/2022 1237   RDW 12.9 12/30/2022 1237   RDW 12.5 10/30/2019 1030   RDW 12.6 02/15/2017 0852   LYMPHSABS 2.5 12/30/2022 1237   LYMPHSABS 1.4 02/15/2017 0852   MONOABS 0.5 12/30/2022 1237   MONOABS 0.3 02/15/2017 0852   EOSABS 0.1 12/30/2022 1237   EOSABS 0.1 02/15/2017 0852   BASOSABS 0.0 12/30/2022 1237   BASOSABS 0.0 02/15/2017 0852    CMP     Component Value Date/Time   NA 141 12/30/2022 1237   NA 140 10/30/2019 1030   NA 141 02/15/2017 0852   K 3.3 (L) 12/30/2022 1237   K 3.9 02/15/2017 0852   CL 106 12/30/2022 1237   CL 101 11/15/2012 0904   CO2 26 12/30/2022 1237   CO2 25 02/15/2017 0852   GLUCOSE 80 12/30/2022 1237   GLUCOSE 87 02/15/2017 0852   GLUCOSE 69 (L) 11/15/2012 0904   BUN 11 12/30/2022 1237   BUN 11 10/30/2019 1030   BUN 12.5 02/15/2017 0852   CREATININE 0.64 12/30/2022 1237   CREATININE 0.9 02/15/2017 0852   CALCIUM 9.3 12/30/2022 1237   CALCIUM 9.3 02/15/2017 0852   PROT 7.5  12/30/2022 1237   PROT 6.7 10/30/2019 1030   PROT 7.0 02/15/2017 0852   ALBUMIN 4.5 12/30/2022 1237   ALBUMIN 4.2  10/30/2019 1030   ALBUMIN 3.6 02/15/2017 0852   AST 19 12/30/2022 1237   AST 16 02/15/2017 0852   ALT 14 12/30/2022 1237   ALT 13 02/15/2017 0852   ALKPHOS 50 12/30/2022 1237   ALKPHOS 63 02/15/2017 0852   BILITOT 0.5 12/30/2022 1237   BILITOT 0.69 02/15/2017 0852   GFRNONAA >60 12/30/2022 1237   GFRAA 100 10/30/2019 1030       ASSESSMENT and THERAPY PLAN:   Malignant neoplasm of upper-inner quadrant of right breast in female, estrogen receptor positive (HCC) Sherry Barry is a 52 year old woman with stage IV triple positive breast cancer currently on treatment with herceptin/perjeta, tamoxifen and zometa. She is doing really well so far, denies any major complaints.  She continues on Herceptin and Perjeta maintenance.  She is also followed by cardio oncology and has her next echocardiogram scheduled on August 2.  No concerns for cardiac toxicity on today's exam.   Her most recent imaging is pending official read however upon my review there is no concern for new disease or any progression. I have encouraged her to continue the current combination.  If she ends up being in remission, we can eventually remove pertuzumab and just continue on Herceptin maintenance.  She understands the plan.  She will return to clinic in 6 weeks.    All questions were answered. The patient knows to call the clinic with any problems, questions or concerns. We can certainly see the patient much sooner if necessary.  Total encounter time:30 minutes*in face-to-face visit time, chart review, lab review, care coordination, order entry, and documentation of the encounter time.  *Total Encounter Time as defined by the Centers for Medicare and Medicaid Services includes, in addition to the face-to-face time of a patient visit (documented in the note above) non-face-to-face time: obtaining and reviewing outside history, ordering and reviewing medications, tests or procedures, care coordination (communications with other  health care professionals or caregivers) and documentation in the medical record.

## 2022-12-30 NOTE — Assessment & Plan Note (Signed)
Sherry Barry is a 52 year old woman with stage IV triple positive breast cancer currently on treatment with herceptin/perjeta, tamoxifen and zometa. She is doing really well so far, denies any major complaints.  She continues on Herceptin and Perjeta maintenance.  She is also followed by cardio oncology and has her next echocardiogram scheduled on August 2.  No concerns for cardiac toxicity on today's exam.   Her most recent imaging is pending official read however upon my review there is no concern for new disease or any progression. I have encouraged her to continue the current combination.  If she ends up being in remission, we can eventually remove pertuzumab and just continue on Herceptin maintenance.  She understands the plan.  She will return to clinic in 6 weeks.

## 2022-12-31 ENCOUNTER — Other Ambulatory Visit: Payer: Self-pay | Admitting: *Deleted

## 2023-01-03 ENCOUNTER — Other Ambulatory Visit: Payer: Self-pay

## 2023-01-13 ENCOUNTER — Other Ambulatory Visit: Payer: Self-pay

## 2023-01-13 ENCOUNTER — Inpatient Hospital Stay: Payer: Federal, State, Local not specified - PPO | Attending: Hematology and Oncology

## 2023-01-13 VITALS — BP 125/89 | HR 78 | Temp 98.8°F | Resp 17 | Wt 136.0 lb

## 2023-01-13 DIAGNOSIS — Z5111 Encounter for antineoplastic chemotherapy: Secondary | ICD-10-CM | POA: Insufficient documentation

## 2023-01-13 DIAGNOSIS — Z5112 Encounter for antineoplastic immunotherapy: Secondary | ICD-10-CM | POA: Insufficient documentation

## 2023-01-13 DIAGNOSIS — C7951 Secondary malignant neoplasm of bone: Secondary | ICD-10-CM | POA: Insufficient documentation

## 2023-01-13 DIAGNOSIS — Z79810 Long term (current) use of selective estrogen receptor modulators (SERMs): Secondary | ICD-10-CM | POA: Insufficient documentation

## 2023-01-13 DIAGNOSIS — C50211 Malignant neoplasm of upper-inner quadrant of right female breast: Secondary | ICD-10-CM | POA: Insufficient documentation

## 2023-01-13 DIAGNOSIS — Z923 Personal history of irradiation: Secondary | ICD-10-CM | POA: Diagnosis not present

## 2023-01-13 DIAGNOSIS — Z170 Estrogen receptor positive status [ER+]: Secondary | ICD-10-CM | POA: Diagnosis not present

## 2023-01-13 DIAGNOSIS — Z90710 Acquired absence of both cervix and uterus: Secondary | ICD-10-CM | POA: Diagnosis not present

## 2023-01-13 DIAGNOSIS — Z9013 Acquired absence of bilateral breasts and nipples: Secondary | ICD-10-CM | POA: Diagnosis not present

## 2023-01-13 MED ORDER — DIPHENHYDRAMINE HCL 25 MG PO CAPS
25.0000 mg | ORAL_CAPSULE | Freq: Once | ORAL | Status: AC
  Administered 2023-01-13: 25 mg via ORAL
  Filled 2023-01-13: qty 1

## 2023-01-13 MED ORDER — SODIUM CHLORIDE 0.9 % IV SOLN: Freq: Once | INTRAVENOUS | Status: AC

## 2023-01-13 MED ORDER — SODIUM CHLORIDE 0.9% FLUSH: 10.0000 mL | INTRAVENOUS | Status: DC | PRN

## 2023-01-13 MED ORDER — HEPARIN SOD (PORK) LOCK FLUSH 100 UNIT/ML IV SOLN: 500.0000 [IU] | Freq: Once | INTRAVENOUS | Status: DC | PRN

## 2023-01-13 MED ORDER — SODIUM CHLORIDE 0.9 % IV SOLN
420.0000 mg | Freq: Once | INTRAVENOUS | Status: AC
  Administered 2023-01-13: 420 mg via INTRAVENOUS
  Filled 2023-01-13: qty 14

## 2023-01-13 MED ORDER — TRASTUZUMAB-DKST CHEMO 150 MG IV SOLR
6.0000 mg/kg | Freq: Once | INTRAVENOUS | Status: AC
  Administered 2023-01-13: 378 mg via INTRAVENOUS
  Filled 2023-01-13: qty 18

## 2023-01-13 MED ORDER — ACETAMINOPHEN 325 MG PO TABS
650.0000 mg | ORAL_TABLET | Freq: Once | ORAL | Status: AC
  Administered 2023-01-13: 650 mg via ORAL
  Filled 2023-01-13: qty 2

## 2023-01-13 NOTE — Progress Notes (Signed)
Pt declined to stay for post 30 min obs, discharged with VSS, ambulatory to lobby

## 2023-01-13 NOTE — Patient Instructions (Signed)
[facility]  Discharge Instructions: Thank you for choosing [facility] to provide your oncology and hematology care.   If you have a lab appointment with the Cancer Center, please go directly to the Cancer Center and check in at the registration area.   Wear comfortable clothing and clothing appropriate for easy access to any Portacath or PICC line.   We strive to give you quality time with your provider. You may need to reschedule your appointment if you arrive late (15 or more minutes).  Arriving late affects you and other patients whose appointments are after yours.  Also, if you miss three or more appointments without notifying the office, you may be dismissed from the clinic at the provider's discretion.      For prescription refill requests, have your pharmacy contact our office and allow 72 hours for refills to be completed.    Today you received the following chemotherapy and/or immunotherapy agents: Trastuzumab and Pertuzumab      To help prevent nausea and vomiting after your treatment, we encourage you to take your nausea medication as directed.  BELOW ARE SYMPTOMS THAT SHOULD BE REPORTED IMMEDIATELY: *FEVER GREATER THAN 100.4 F (38 C) OR HIGHER *CHILLS OR SWEATING *NAUSEA AND VOMITING THAT IS NOT CONTROLLED WITH YOUR NAUSEA MEDICATION *UNUSUAL SHORTNESS OF BREATH *UNUSUAL BRUISING OR BLEEDING *URINARY PROBLEMS (pain or burning when urinating, or frequent urination) *BOWEL PROBLEMS (unusual diarrhea, constipation, pain near the anus) TENDERNESS IN MOUTH AND THROAT WITH OR WITHOUT PRESENCE OF ULCERS (sore throat, sores in mouth, or a toothache) UNUSUAL RASH, SWELLING OR PAIN  UNUSUAL VAGINAL DISCHARGE OR ITCHING   Items with * indicate a potential emergency and should be followed up as soon as possible or go to the Emergency Department if any problems should occur.  Please show the CHEMOTHERAPY ALERT CARD or IMMUNOTHERAPY  ALERT CARD at check-in to the Emergency Department and triage nurse.  Should you have questions after your visit or need to cancel or reschedule your appointment, please contact [facility]  Dept: [phone]  and follow the prompts.  Office hours are 8:00 a.m. to 4:30 p.m. Monday - Friday. Please note that voicemails left after 4:00 p.m. may not be returned until the following business day.  We are closed weekends and major holidays. You have access to a nurse at all times for urgent questions. Please call the main number to the clinic Dept: [phone] and follow the prompts.   For any non-urgent questions, you may also contact your provider using MyChart. We now offer e-Visits for anyone 18 and older to request care online for non-urgent symptoms. For details visit [email or web address].   Also download the MyChart app! Go to the app store, search "MyChart", open the app, select [facility], and log in with your MyChart username and password.   

## 2023-01-14 ENCOUNTER — Ambulatory Visit (HOSPITAL_COMMUNITY)
Admission: RE | Admit: 2023-01-14 | Discharge: 2023-01-14 | Disposition: A | Discharge status: Home / Self Care | Payer: Federal, State, Local not specified - PPO | Source: Ambulatory Visit | Attending: Family Medicine | Admitting: Family Medicine

## 2023-01-14 ENCOUNTER — Encounter (HOSPITAL_COMMUNITY): Payer: Self-pay | Admitting: Cardiology

## 2023-01-14 ENCOUNTER — Ambulatory Visit (HOSPITAL_COMMUNITY)
Admission: RE | Admit: 2023-01-14 | Discharge: 2023-01-14 | Disposition: A | Discharge status: Home / Self Care | Payer: Federal, State, Local not specified - PPO | Source: Ambulatory Visit | Attending: Cardiology | Admitting: Cardiology

## 2023-01-14 VITALS — BP 120/80 | HR 81 | Wt 136.0 lb

## 2023-01-14 DIAGNOSIS — Z170 Estrogen receptor positive status [ER+]: Secondary | ICD-10-CM | POA: Diagnosis not present

## 2023-01-14 DIAGNOSIS — Z79899 Other long term (current) drug therapy: Secondary | ICD-10-CM | POA: Insufficient documentation

## 2023-01-14 DIAGNOSIS — Z7960 Long term (current) use of unspecified immunomodulators and immunosuppressants: Secondary | ICD-10-CM | POA: Diagnosis not present

## 2023-01-14 DIAGNOSIS — Z9221 Personal history of antineoplastic chemotherapy: Secondary | ICD-10-CM | POA: Insufficient documentation

## 2023-01-14 DIAGNOSIS — I509 Heart failure, unspecified: Secondary | ICD-10-CM

## 2023-01-14 DIAGNOSIS — I110 Hypertensive heart disease with heart failure: Secondary | ICD-10-CM | POA: Diagnosis not present

## 2023-01-14 DIAGNOSIS — Z9013 Acquired absence of bilateral breasts and nipples: Secondary | ICD-10-CM | POA: Insufficient documentation

## 2023-01-14 DIAGNOSIS — Z5181 Encounter for therapeutic drug level monitoring: Secondary | ICD-10-CM | POA: Diagnosis not present

## 2023-01-14 DIAGNOSIS — Z923 Personal history of irradiation: Secondary | ICD-10-CM | POA: Insufficient documentation

## 2023-01-14 DIAGNOSIS — C50211 Malignant neoplasm of upper-inner quadrant of right female breast: Secondary | ICD-10-CM

## 2023-01-14 LAB — ECHOCARDIOGRAM COMPLETE

## 2023-01-14 NOTE — Patient Instructions (Signed)
There has been no changes to your medications.  Your physician has requested that you have an echocardiogram. Echocardiography is a painless test that uses sound waves to create images of your heart. It provides your doctor with information about the size and shape of your heart and how well your heart's chambers and valves are working. This procedure takes approximately one hour. There are no restrictions for this procedure. Please do NOT wear cologne, perfume, aftershave, or lotions (deodorant is allowed). Please arrive 15 minutes prior to your appointment time.  Your physician recommends that you schedule a follow-up appointment in: 6 months with an echocardiogram ( February 2025) **PLEASE CALL THE OFFICE IN DECEMBER TO ARRANGE YOUR FOLLOW UP APPOINTMENT. **  If you have any questions or concerns before your next appointment please send [place] a message through [place] or call our office at [phone].    TO LEAVE A MESSAGE FOR THE NURSE SELECT OPTION 2, PLEASE LEAVE A MESSAGE INCLUDING: YOUR NAME DATE OF BIRTH CALL BACK NUMBER REASON FOR CALL**this is important as we prioritize the call backs  YOU WILL RECEIVE A CALL BACK THE SAME DAY AS LONG AS YOU CALL BEFORE 4:00 PM  At the [facility], you and your health needs are our priority. As part of our continuing mission to provide you with exceptional heart care, we have created designated Provider Care Teams. These Care Teams include your primary Cardiologist (physician) and Advanced Practice Providers (APPs- Physician Assistants and Nurse Practitioners) who all work together to provide you with the care you need, when you need it.   You may see any of the following providers on your designated Care Team at your next follow up: Dr Arvilla Meres Dr Marca Ancona Dr. Marcos Eke, NP Robbie Lis, [place] [facility] [place], [place] Brynda Peon, NP Karle Plumber, PharmD   Please be sure to bring in  all your medications bottles to every appointment.    Thank you for choosing [facility]

## 2023-01-16 NOTE — Progress Notes (Signed)
Oncology: Dr. Al Pimple  52 y.o. with history of stage IV triple positive breast cancer presents for cardio-oncology evaluation.  Initial diagnosis in 1/14.  Bilateral mastectomy, ER+/PR+/HER2+.  She was treated with carboplatin/docetaxel/trastuzumab x 6 cycles then trastuzumab alone to complete 1 year.  She had radiation as well. In 4/23, patient was found to have recurrence with left malignant pleural effusion. She has completed chemo with docetaxel/trastuzumab/pertuzumab and will continue trastuzumab.    Echo in 10/23 showed. EF 60-65% with GLS -20.4% (stable). Echo in 1/24 showed EF 60-65%, RV normal, GLS less negative at -15.5% but poor tracking of the endocardium. Echo was done today and reviewed, EF 55-60%, GLS -18.4%, normal diastolic function, normal RV.    Echo was done today and reviewed, EF 55-60%, normal diastolic function, GLS -20.4%, normal RV.   Patient did not have cardiac complications from her initial chemotherapy involving trastuzumab in 2014.  She does have treated HTN.  No family history of cardiomyopathy or early CAD.  Nonsmoker.  She is doing well, no exertional dyspnea or chest pain.  No complaints today.  She continues on trastuzumab/Perjeta long-term.   ECG (personally reviewed): NSR, normla  Labs (5/23): K 3.3, creatinine 0.64 Labs (1/24): EF 3.3, creatinine 0.66, hgb 14.6 Labs (4/24): K 2.9, creatinine 0.51  PMH: 1. HTN 2. Migraines 3. Breast cancer: Initial diagnosis in 1/14.  Bilateral mastectomy, ER+/PR+/HER2+.  She was treated with carboplatin/docetaxel/trastuzumab x 6 cycles then trastuzumab alone to complete 1 year.  She had radiation.  - 4/23 patient had recurrence with left malignant pleural effusion found. She has started chemo with docetaxel/trastuzumab/pertuzumab.  - Echo (4/23): EF 60-65%, GLS -16.8%, RV normal.  - Echo (7/23): EF 65-70%, GLS -21.3%, RV normal.  - Echo (10/23): EF 60-65%, GLS -20.4%, normal diastolic function, normal RV, mild MR - Echo  (1/24): EF 60-65%, RV normal, GLS less negative at -15.5% but poor tracking of the endocardium.  - Echo (4/24): EF 55-60%, GLS -18.4%, normal diastolic function, normal RV.  - Echo (7/24): EF 55-60%, normal diastolic function, GLS -20.4%, normal RV.   Social History   Socioeconomic History   Marital status: Married    Spouse name: Not on file   Number of children: 2   Years of education: Not on file   Highest education level: Not on file  Occupational History    Employer: [employer]  Tobacco Use   Smoking status: Never   Smokeless tobacco: Never  Substance and Sexual Activity   Alcohol use: Yes    Comment: occasional   Drug use: No   Sexual activity: Yes    Birth control/protection: Surgical  Other Topics Concern   Not on file  Social History Narrative   Not on file   Social Determinants of Health   Financial Resource Strain: Not on file  Food Insecurity: Not on file  Transportation Needs: Not on file  Physical Activity: Not on file  Stress: Not on file  Social Connections: Not on file  Intimate Partner Violence: Not on file   Family History  Problem Relation Age of Onset   Hypertension Mother    Alcohol abuse Mother    Heart disease Maternal Grandmother    Stroke Maternal Grandfather    Colon cancer Maternal Aunt 55       alive at 75   Brain cancer Maternal Uncle 17       and lymphoma in early 100s; deceased   Brain cancer Maternal Uncle 31  deceased   Pancreatic cancer Maternal Uncle 45       alive at 28   Breast cancer Cousin 26       mat 1st cousin once removed through mat GF ; deceased   Breast cancer Maternal Aunt        great aunt through mat GF; dx at ? age   ROS: All systems reviewed and negative except as per HPI.   Current Outpatient Medications  Medication Sig Dispense Refill   acetaminophen (TYLENOL) 500 MG tablet Take 1,000 mg by mouth every 6 (six) hours as needed for moderate pain.     amLODipine (NORVASC) 10 MG tablet Take 1  tablet (10 mg total) by mouth every morning. 30 tablet 3   lidocaine-prilocaine (EMLA) cream Apply 1 Application topically as needed. 30 g 0   potassium chloride SA (KLOR-CON M20) 20 MEQ tablet Take 2 tablets (40 mEq total) by mouth daily. 90 tablet 1   tamoxifen (NOLVADEX) 20 MG tablet Take 1 tablet (20 mg total) by mouth daily. 90 tablet 3   triamcinolone ointment (KENALOG) 0.1 % Apply topically 2 (two) times daily.     No current facility-administered medications for this encounter.   Facility-Administered Medications Ordered in Other Encounters  Medication Dose Route Frequency Provider Last Rate Last Admin   acetaminophen (TYLENOL) 325 MG tablet            diphenhydrAMINE (BENADRYL) 25 mg capsule            BP 120/80   Pulse 81   Wt 61.7 kg (136 lb)   SpO2 100%   BMI 24.87 kg/m  General: NAD Neck: No JVD, no thyromegaly or thyroid nodule.  Lungs: Clear to auscultation bilaterally with normal respiratory effort. CV: Nondisplaced PMI.  Heart regular S1/S2, no S3/S4, no murmur.  No peripheral edema.  No carotid bruit.  Normal pedal pulses.  Abdomen: Soft, nontender, no hepatosplenomegaly, no distention.  Skin: Intact without lesions or rashes.  Neurologic: Alert and oriented x 3.  Psych: Normal affect. Extremities: No clubbing or cyanosis.  HEENT: Normal.    Assessment/Plan: 1. Stage IV triple positive breast cancer: Patient had trastuzumab as part of her therapy back in 2014 with no cardiac complications.  She has been back on trastuzumab-based therapy for > 1 year now.  Today's echo shows stable LVEF with normal strain.  No exertional symptoms. She will be on Herceptin/Perjeta long-term.  - At this point, I think we can increase the echo screening interval as I would expect to see cardiotoxicity from Herceptin by now if she were to have it. I will arrange for repeat echo in 6 months.  - She can continue Herceptin.  2. HTN: BP controlled on current amlodipine, continue.    Followup 6 months with echo.  Marca Ancona 01/16/2023

## 2023-01-21 ENCOUNTER — Telehealth: Payer: Self-pay | Admitting: Adult Health

## 2023-01-21 ENCOUNTER — Other Ambulatory Visit (HOSPITAL_COMMUNITY): Payer: Self-pay | Admitting: Cardiology

## 2023-01-23 ENCOUNTER — Other Ambulatory Visit: Payer: Self-pay

## 2023-01-31 ENCOUNTER — Inpatient Hospital Stay: Payer: Federal, State, Local not specified - PPO

## 2023-02-03 ENCOUNTER — Other Ambulatory Visit: Payer: Self-pay

## 2023-02-03 DIAGNOSIS — C50211 Malignant neoplasm of upper-inner quadrant of right female breast: Secondary | ICD-10-CM

## 2023-02-04 ENCOUNTER — Inpatient Hospital Stay (HOSPITAL_BASED_OUTPATIENT_CLINIC_OR_DEPARTMENT_OTHER): Payer: Federal, State, Local not specified - PPO | Admitting: Adult Health

## 2023-02-04 ENCOUNTER — Encounter: Payer: Self-pay | Admitting: Adult Health

## 2023-02-04 ENCOUNTER — Inpatient Hospital Stay: Payer: Federal, State, Local not specified - PPO

## 2023-02-04 VITALS — BP 132/98 | HR 101 | Temp 97.2°F | Resp 18 | Ht 62.0 in | Wt 135.8 lb

## 2023-02-04 DIAGNOSIS — C50211 Malignant neoplasm of upper-inner quadrant of right female breast: Secondary | ICD-10-CM | POA: Diagnosis not present

## 2023-02-04 DIAGNOSIS — Z170 Estrogen receptor positive status [ER+]: Secondary | ICD-10-CM

## 2023-02-04 DIAGNOSIS — Z90710 Acquired absence of both cervix and uterus: Secondary | ICD-10-CM | POA: Diagnosis not present

## 2023-02-04 DIAGNOSIS — Z9013 Acquired absence of bilateral breasts and nipples: Secondary | ICD-10-CM | POA: Diagnosis not present

## 2023-02-04 DIAGNOSIS — Z79810 Long term (current) use of selective estrogen receptor modulators (SERMs): Secondary | ICD-10-CM | POA: Diagnosis not present

## 2023-02-04 DIAGNOSIS — Z5111 Encounter for antineoplastic chemotherapy: Secondary | ICD-10-CM | POA: Diagnosis not present

## 2023-02-04 DIAGNOSIS — Z5112 Encounter for antineoplastic immunotherapy: Secondary | ICD-10-CM | POA: Diagnosis not present

## 2023-02-04 DIAGNOSIS — C7951 Secondary malignant neoplasm of bone: Secondary | ICD-10-CM | POA: Diagnosis not present

## 2023-02-04 DIAGNOSIS — Z923 Personal history of irradiation: Secondary | ICD-10-CM | POA: Diagnosis not present

## 2023-02-04 LAB — CBC WITH DIFFERENTIAL (CANCER CENTER ONLY)
Abs Immature Granulocytes: 0.02 10*3/uL (ref 0.00–0.07)
Basophils Absolute: 0 10*3/uL (ref 0.0–0.1)
Basophils Relative: 0 %
Eosinophils Absolute: 0.2 10*3/uL (ref 0.0–0.5)
Eosinophils Relative: 3 %
HCT: 40.5 % (ref 36.0–46.0)
Hemoglobin: 14.5 g/dL (ref 12.0–15.0)
Immature Granulocytes: 0 %
Lymphocytes Relative: 32 %
Lymphs Abs: 1.7 10*3/uL (ref 0.7–4.0)
MCH: 30.1 pg (ref 26.0–34.0)
MCHC: 35.8 g/dL (ref 30.0–36.0)
MCV: 84 fL (ref 80.0–100.0)
Monocytes Absolute: 0.5 10*3/uL (ref 0.1–1.0)
Monocytes Relative: 9 %
Neutro Abs: 3 10*3/uL (ref 1.7–7.7)
Neutrophils Relative %: 56 %
Platelet Count: 236 10*3/uL (ref 150–400)
RBC: 4.82 MIL/uL (ref 3.87–5.11)
RDW: 12.4 % (ref 11.5–15.5)
WBC Count: 5.3 10*3/uL (ref 4.0–10.5)
nRBC: 0 % (ref 0.0–0.2)

## 2023-02-04 LAB — CMP (CANCER CENTER ONLY)
ALT: 12 U/L (ref 0–44)
AST: 15 U/L (ref 15–41)
Albumin: 4.2 g/dL (ref 3.5–5.0)
Alkaline Phosphatase: 42 U/L (ref 38–126)
Anion gap: 5 (ref 5–15)
BUN: 11 mg/dL (ref 6–20)
CO2: 27 mmol/L (ref 22–32)
Calcium: 8.9 mg/dL (ref 8.9–10.3)
Chloride: 110 mmol/L (ref 98–111)
Creatinine: 0.73 mg/dL (ref 0.44–1.00)
GFR, Estimated: >60 mL/min (ref >60)
Glucose, Bld: 84 mg/dL (ref 70–99)
Potassium: 4 mmol/L (ref 3.5–5.1)
Sodium: 142 mmol/L (ref 135–145)
Total Bilirubin: 0.4 mg/dL (ref 0.3–1.2)
Total Protein: 6.9 g/dL (ref 6.5–8.1)

## 2023-02-04 MED ORDER — GOSERELIN ACETATE 10.8 MG ~~LOC~~ IMPL
10.8000 mg | DRUG_IMPLANT | Freq: Once | SUBCUTANEOUS | Status: AC
  Administered 2023-02-04: 10.8 mg via SUBCUTANEOUS
  Filled 2023-02-04: qty 10.8

## 2023-02-04 MED ORDER — HEPARIN SOD (PORK) LOCK FLUSH 100 UNIT/ML IV SOLN
500.0000 [IU] | Freq: Once | INTRAVENOUS | Status: AC | PRN
  Administered 2023-02-04: 500 [IU]

## 2023-02-04 MED ORDER — SODIUM CHLORIDE 0.9% FLUSH
10.0000 mL | INTRAVENOUS | Status: DC | PRN
  Administered 2023-02-04: 10 mL

## 2023-02-04 MED ORDER — ACETAMINOPHEN 325 MG PO TABS
650.0000 mg | ORAL_TABLET | Freq: Once | ORAL | Status: AC
  Administered 2023-02-04: 650 mg via ORAL
  Filled 2023-02-04: qty 2

## 2023-02-04 MED ORDER — DIPHENHYDRAMINE HCL 25 MG PO CAPS
25.0000 mg | ORAL_CAPSULE | Freq: Once | ORAL | Status: AC
  Administered 2023-02-04: 25 mg via ORAL
  Filled 2023-02-04: qty 1

## 2023-02-04 MED ORDER — SODIUM CHLORIDE 0.9 % IV SOLN
420.0000 mg | Freq: Once | INTRAVENOUS | Status: AC
  Administered 2023-02-04: 420 mg via INTRAVENOUS
  Filled 2023-02-04: qty 14

## 2023-02-04 MED ORDER — TRASTUZUMAB-DKST CHEMO 150 MG IV SOLR
6.0000 mg/kg | Freq: Once | INTRAVENOUS | Status: AC
  Administered 2023-02-04: 378 mg via INTRAVENOUS
  Filled 2023-02-04: qty 18

## 2023-02-04 MED ORDER — SODIUM CHLORIDE 0.9% FLUSH
10.0000 mL | Freq: Once | INTRAVENOUS | Status: AC
  Administered 2023-02-04: 10 mL

## 2023-02-04 MED ORDER — SODIUM CHLORIDE 0.9 % IV SOLN: Freq: Once | INTRAVENOUS | Status: AC

## 2023-02-04 MED ORDER — ZOLEDRONIC ACID 4 MG/100ML IV SOLN
4.0000 mg | Freq: Once | INTRAVENOUS | Status: AC
  Administered 2023-02-04: 4 mg via INTRAVENOUS
  Filled 2023-02-04: qty 100

## 2023-02-04 NOTE — Progress Notes (Signed)
[facility] Cancer Follow up:    Sherry Cipro, NP 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 breast in female, estrogen receptor positive (HCC) Staging form: Breast, AJCC 7th Edition - Clinical: Stage IA (T1c, N0, cM0) - Unsigned Specimen type: Core Needle Biopsy Histopathologic type: 9931 Laterality: Right Staging comments: Staged at breast conference 1.15.14  - Pathologic stage from 07/14/2012: Stage IV (TX, NX, M1) - Signed by Sherry Moulds, MD on 08/28/2021 Specimen type: Core Needle Biopsy Histopathologic type: 9931 Laterality: Right   SUMMARY OF ONCOLOGIC HISTORY: Oncology History  Malignant neoplasm of upper-inner quadrant of right breast in female, estrogen receptor positive (HCC)  07/13/2012 Clinical Stage   Stage IA: T1c N0   07/14/2012 Definitive Surgery   Bilateral mastectomy/right SLNB: RIGHT invasive ductal carcinoma, grade 3, ER+, PR +, Her2/neu positive (ratio 3.02), Ki67 48%. DCIS. 1/4 LN positive for malignancy. LEFT: benign   07/14/2012 Pathologic Stage   Stage IIB: T2 N1a M0   07/14/2012 Cancer Staging   Staging form: Breast, AJCC 7th Edition - Pathologic stage from 07/14/2012: Stage IV (TX, NX, M1) - Signed by Sherry Moulds, MD on 08/28/2021 Specimen type: Core Needle Biopsy Histopathologic type: 9931 Laterality: Right   08/17/2012 - 08/30/2013 Chemotherapy   Adjuvant carboplatin, docetaxel, and trastuzumab x 6 cycles (completed 11/30/2012) followed by maintenance trastuzumab to total one year   11/2012 Procedure   Comp Cancer Gene panel (GeneDx) negative for deleterious mutations    - 02/2013 Radiation Therapy   Adjuvant RT to right breast   02/2013 -  Anti-estrogen oral therapy   Tamoxifen 20 mg daily   09/24/2013 Surgery   Bilateral breast reconstruction with latissmus flap and expander placement   12/12/2013 Surgery   Implant placement     Relapse/Recurrence   Left sided malignant pleural effusion.  Tumor cells are positive for GATA3 and ER, negative for TTF-1 consistent with recurrent breast carcinoma Prognostic showed ER 90% positive strong staining, PR 80% positive strong staining, HER2 negative.    09/16/2021 Imaging   PET scan showed malignant left pleural effusion with extensive areas of nodularity and hypermetabolic activity involving the mediastinal border and pericardium as well as the most inferior aspects of the costodiaphragmatic recess.  Signs of nodal disease at the thoracic inlet on the left suspect extension of disease below the diaphragm along the left anterolateral aorta.  Nonspecific moderate to markedly increased metabolic activity about the base of the tongue bilaterally.  Consider direct visualization showing mildly asymmetric uptake favoring the right lingual tonsil.  Sclerotic foci without marked increased metabolic activity suspicious for metastatic disease perhaps treated or questions.  Heterogeneous marrow uptake seen elsewhere generalized and nonspecific.  Consider spinal MRI is warranted  MRI brain without any evidence of intracranial metastatic disease.   10/09/2021 - 02/11/2022 Chemotherapy   Taxotere, Herceptin, Perjeta x 6   12/04/2021 Imaging   There is no evidence new metastatic disease. There is interval decrease in the left pleural effusion possibly suggesting resolving pleural metastatic disease. Few scattered sclerotic metastatic lesions in the skeletal structures have not changed significantly.   No acute findings are seen in the chest abdomen and pelvis.     02/22/2022 -  Antibody Plan   Herceptin/Perjeta every 3 weeks   03/21/2022 Imaging   CT chest abdomen pelvis  IMPRESSION: 1. No significant change since previous study. Again demonstrated is a chronic small left pleural effusion with basilar  atelectasis and scattered sclerotic skeletal metastases. 2. No acute abnormalities are  demonstrated. 3. Small esophageal hiatal hernia.     Electronically Signed   By: Sherry Barry M.D.   On: 03/21/2022 20:23     CURRENT THERAPY: Herceptin Perjeta; tamoxifen; Zometa; Zoladex  INTERVAL HISTORY: Sherry Barry 52 y.o. female returns for follow-up prior to receiving Herceptin Perjeta, Zoladex, and Zometa.  She is tolerating all these moderately well.  He is also taking tamoxifen daily with good tolerance.  She denies any oral or dental concerns as she is due to receive Zometa today.  Her most recent restaging CT chest abdomen and pelvis occurred on December 29, 2022 it did not demonstrate any new or progressive disease in her chest abdomen and pelvis.  Her bone metastasis was unchanged, and she had unchanged minimal residual left-sided pleural thickening without a significant effusion.    Her most recent echo occurred on 01/14/2023 and was normal.  She sees Dr. Shirlee Latch and he is transitioning her to echocardiograms every 6 months as she has maintained stability on Hercetin based treatments thus far.    Vaginal dryness.  She is using Revaree for this.  She has noticed a slight improvement but overall still struggles with the dryness.  She notes occasional hand pain and works a lot on Production designer, theatre/television/film.  She notes her toes have been cramping and this was worse when she was here few weeks ago and her potassium was decreased.  Patient Active Problem List   Diagnosis Date Noted   Tachycardia 12/10/2021   Chemotherapy induced diarrhea 10/16/2021   Malignant pleural effusion 10/16/2021   CAP (community acquired pneumonia) 08/10/2021   Breast asymmetry following reconstructive surgery 09/09/2015   Status post bilateral breast reconstruction 12/20/2013   Acquired absence of bilateral breasts and nipples 09/24/2013   History of breast cancer 08/17/2013   Anxiety 06/28/2013   Eczema 06/28/2013   Malignant neoplasm of upper-inner quadrant of right breast in female, estrogen receptor  positive (HCC) 06/20/2012   Essential hypertension 05/02/2012   Migraine 04/17/2012    is allergic to codeine, hydrocodone, latex, lisinopril, and tomato.  MEDICAL HISTORY: Past Medical History:  Diagnosis Date   Allergy    Breast cancer (HCC)    Breast cancer (HCC)    right   History of chemotherapy    doxetaxel/carboplatin/trastuzumab   History of migraine    last one about a week ago   Hx of radiation therapy 01/01/13- 02/15/13   r chest wall, r supraclav/axilla 5040 cGy/28 sessions, scar boost 1000 cGy/5 sessions   Hypertension    no meds,   urgent care on pomana   Migraine    Migraine     SURGICAL HISTORY: Past Surgical History:  Procedure Laterality Date   ABDOMINAL HYSTERECTOMY     no salpingo-oophorectomy 2009   BREAST RECONSTRUCTION WITH PLACEMENT OF TISSUE EXPANDER AND FLEX HD (ACELLULAR HYDRATED DERMIS) Left 09/24/2013   IR IMAGING GUIDED PORT INSERTION  10/07/2021   IR THORACENTESIS ASP PLEURAL SPACE W/IMG GUIDE  08/11/2021   IR THORACENTESIS ASP PLEURAL SPACE W/IMG GUIDE  10/07/2021   LATISSIMUS FLAP TO BREAST Right 09/24/2013   Procedure: RIGHT LATISSMUS MYOCUTAEIOUS MUSCLE FLAP AND PLACEMENT OF TISSUE Lurena Nida;  Surgeon: Wayland Denis, DO;  Location: [facility];  Service: Plastics;  Laterality: Right;   LIPOSUCTION WITH LIPOFILLING Bilateral 12/12/2013   Procedure: LIPOSUCTION WITH LIPOFILLING;  Surgeon: Wayland Denis, DO;  Location: [facility];  Service: Plastics;  Laterality: Bilateral;   PORT-A-CATH  REMOVAL Left 12/12/2013   Procedure: REMOVAL PORT-A-CATH;  Surgeon: Wayland Denis, DO;  Location: [facility];  Service: Plastics;  Laterality: Left;   PORTACATH PLACEMENT  07/14/2012   Procedure: INSERTION PORT-A-CATH;  Surgeon: Clovis Pu. Cornett, MD;  Location: [facility];  Service: General;  Laterality: Left;   RECONSTRUCTION BREAST W/ LATISSIMUS DORSI FLAP Right 09/24/2013   "& tissue expander placement"   REMOVAL OF BILATERAL TISSUE EXPANDERS WITH  PLACEMENT OF BILATERAL BREAST IMPLANTS Bilateral 12/12/2013   Procedure: REMOVAL OF BILATERAL TISSUE EXPANDERS WITH PLACEMENT OF BILATERAL BREAST IMPLANTS/BILATERAL CAPSULECTOMIES WITH  LIPOFILLING FAT GRAFTING;  Surgeon: Wayland Denis, DO;  Location: [facility];  Service: Plastics;  Laterality: Bilateral;   SIMPLE MASTECTOMY WITH AXILLARY SENTINEL NODE BIOPSY  07/14/2012   Procedure: SIMPLE MASTECTOMY WITH AXILLARY SENTINEL NODE BIOPSY;  Surgeon: Clovis Pu. Cornett, MD;  Location: [facility];  Service: General;  Laterality: Right;  Bilateral simple mastectomy with port and right sebtibel lymph node mapping   SIMPLE MASTECTOMY WITH AXILLARY SENTINEL NODE BIOPSY  07/14/2012   Procedure: SIMPLE MASTECTOMY;  Surgeon: Clovis Pu. Cornett, MD;  Location: [facility];  Service: General;  Laterality: Left;   TISSUE EXPANDER PLACEMENT Left 09/24/2013   Procedure: PLACEMENT OF TISSUE EXPANDER AND FLEX HD TO LEFT BREAST;  Surgeon: Wayland Denis, DO;  Location: [facility];  Service: Plastics;  Laterality: Left;    SOCIAL HISTORY: Social History   Socioeconomic History   Marital status: Married    Spouse name: Not on file   Number of children: 2   Years of education: Not on file   Highest education level: Not on file  Occupational History    Employer: [employer]  Tobacco Use   Smoking status: Never   Smokeless tobacco: Never  Substance and Sexual Activity   Alcohol use: Yes    Comment: occasional   Drug use: No   Sexual activity: Yes    Birth control/protection: Surgical  Other Topics Concern   Not on file  Social History Narrative   Not on file   Social Determinants of Health   Financial Resource Strain: Not on file  Food Insecurity: Not on file  Transportation Needs: Not on file  Physical Activity: Not on file  Stress: Not on file  Social Connections: Not on file  Intimate Partner Violence: Not on file    FAMILY HISTORY: Family History  Problem Relation Age of Onset   Hypertension  Mother    Alcohol abuse Mother    Heart disease Maternal Grandmother    Stroke Maternal Grandfather    Colon cancer Maternal Aunt 55       alive at 47   Brain cancer Maternal Uncle 76       and lymphoma in early 75s; deceased   Brain cancer Maternal Uncle 60       deceased   Pancreatic cancer Maternal Uncle 45       alive at 35   Breast cancer Cousin 104       mat 1st cousin once removed through mat GF ; deceased   Breast cancer Maternal Aunt        great aunt through mat GF; dx at ? age    Review of Systems  Constitutional:  Negative for appetite change, chills, fatigue, fever and unexpected weight change.  HENT:   Negative for hearing loss, lump/mass and trouble swallowing.   Eyes:  Negative for eye problems and icterus.  Respiratory:  Negative for chest  tightness, cough and shortness of breath.   Cardiovascular:  Negative for chest pain, leg swelling and palpitations.  Gastrointestinal:  Negative for abdominal distention, abdominal pain, constipation, diarrhea, nausea and vomiting.  Endocrine: Negative for hot flashes.  Genitourinary:  Negative for difficulty urinating.   Musculoskeletal:  Negative for arthralgias.  Skin:  Negative for itching and rash.  Neurological:  Negative for dizziness, extremity weakness, headaches and numbness.  Hematological:  Negative for adenopathy. Does not bruise/bleed easily.  Psychiatric/Behavioral:  Negative for depression. The patient is not nervous/anxious.       PHYSICAL EXAMINATION   Onc Performance Status - 02/04/23 1000       ECOG Perf Status   ECOG Perf Status Fully active, able to carry on all pre-disease performance without restriction      KPS SCALE   KPS % SCORE Normal, no compliants, no evidence of disease             Vitals:   02/04/23 1033  BP: (!) 132/98  Pulse: (!) 101  Resp: 18  Temp: (!) 97.2 F (36.2 C)  SpO2: 100%    Physical Exam Constitutional:      General: She is not in acute distress.     Appearance: Normal appearance. She is not toxic-appearing.  HENT:     Head: Normocephalic and atraumatic.     Mouth/Throat:     Mouth: Mucous membranes are moist.     Pharynx: Oropharynx is clear. No oropharyngeal exudate or posterior oropharyngeal erythema.  Eyes:     General: No scleral icterus. Cardiovascular:     Rate and Rhythm: Normal rate and regular rhythm.     Pulses: Normal pulses.     Heart sounds: Normal heart sounds.  Pulmonary:     Effort: Pulmonary effort is normal.     Breath sounds: Normal breath sounds.  Abdominal:     General: Abdomen is flat. Bowel sounds are normal. There is no distension.     Palpations: Abdomen is soft.     Tenderness: There is no abdominal tenderness.  Musculoskeletal:        General: No swelling.     Cervical back: Neck supple.  Lymphadenopathy:     Cervical: No cervical adenopathy.  Skin:    General: Skin is warm and dry.     Findings: No rash.  Neurological:     General: No focal deficit present.     Mental Status: She is alert.  Psychiatric:        Mood and Affect: Mood normal.        Behavior: Behavior normal.     LABORATORY DATA:  CBC    Component Value Date/Time   WBC 5.3 02/04/2023 1001   WBC 5.4 09/13/2022 1656   RBC 4.82 02/04/2023 1001   HGB 14.5 02/04/2023 1001   HGB 15.9 10/30/2019 1030   HGB 14.7 02/15/2017 0852   HCT 40.5 02/04/2023 1001   HCT 46.1 10/30/2019 1030   HCT 42.9 02/15/2017 0852   PLT 236 02/04/2023 1001   PLT 245 10/30/2019 1030   MCV 84.0 02/04/2023 1001   MCV 88 10/30/2019 1030   MCV 86.7 02/15/2017 0852   MCH 30.1 02/04/2023 1001   MCHC 35.8 02/04/2023 1001   RDW 12.4 02/04/2023 1001   RDW 12.5 10/30/2019 1030   RDW 12.6 02/15/2017 0852   LYMPHSABS 1.7 02/04/2023 1001   LYMPHSABS 1.4 02/15/2017 0852   MONOABS 0.5 02/04/2023 1001   MONOABS 0.3 02/15/2017 0852   EOSABS  0.2 02/04/2023 1001   EOSABS 0.1 02/15/2017 0852   BASOSABS 0.0 02/04/2023 1001   BASOSABS 0.0 02/15/2017 0852     CMP     Component Value Date/Time   NA 142 02/04/2023 1001   NA 140 10/30/2019 1030   NA 141 02/15/2017 0852   K 4.0 02/04/2023 1001   K 3.9 02/15/2017 0852   CL 110 02/04/2023 1001   CL 101 11/15/2012 0904   CO2 27 02/04/2023 1001   CO2 25 02/15/2017 0852   GLUCOSE 84 02/04/2023 1001   GLUCOSE 87 02/15/2017 0852   GLUCOSE 69 (L) 11/15/2012 0904   BUN 11 02/04/2023 1001   BUN 11 10/30/2019 1030   BUN 12.5 02/15/2017 0852   CREATININE 0.73 02/04/2023 1001   CREATININE 0.9 02/15/2017 0852   CALCIUM 8.9 02/04/2023 1001   CALCIUM 9.3 02/15/2017 0852   PROT 6.9 02/04/2023 1001   PROT 6.7 10/30/2019 1030   PROT 7.0 02/15/2017 0852   ALBUMIN 4.2 02/04/2023 1001   ALBUMIN 4.2 10/30/2019 1030   ALBUMIN 3.6 02/15/2017 0852   AST 15 02/04/2023 1001   AST 16 02/15/2017 0852   ALT 12 02/04/2023 1001   ALT 13 02/15/2017 0852   ALKPHOS 42 02/04/2023 1001   ALKPHOS 63 02/15/2017 0852   BILITOT 0.4 02/04/2023 1001   BILITOT 0.69 02/15/2017 0852   GFRNONAA >60 02/04/2023 1001   GFRAA 100 10/30/2019 1030    ASSESSMENT and THERAPY PLAN:   Malignant neoplasm of upper-inner quadrant of right breast in female, estrogen receptor positive (HCC) Sherry Barry is a 52 year old woman with stage IV triple positive breast cancer.  Stage IV breast cancer: Will proceed with treatment today with Herceptin, Perjeta, Zometa, and Zoladex.  She will also continue on tamoxifen daily.  She has no clinical signs of breast cancer progression.  Her most recent restaging scans occurred in July and showed no evidence of progression as well. Vaginal dryness: We discussed using an intimate cleanser such as refresh or Summers Eve's to keep her pH balanced.  We also discussed vitamin E suppositories that she can obtain from [place] called Key-E suppositories. 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  Sherry Barry has ran out of appointments.  I sent a scheduling message to get additional appointment scheduled.  She should return in 3 weeks for her follow-up and treatment.    All questions were answered. The patient knows to call the clinic with any problems, questions or concerns. We can certainly see the patient much sooner if necessary.  Total encounter time:20 minutes*in face-to-face visit time, chart review, lab review, care coordination, order entry, and documentation of the encounter time.    Sherry Anes, NP 02/04/23 11:41 AM Medical Oncology and Hematology [facility] [address] [place], [place] [address] Tel. [phone]    Fax. [phone]  *Total Encounter Time as defined by the Centers for Medicare and Medicaid Services includes, in addition to the face-to-face time of a patient visit (documented in the note above) non-face-to-face time: obtaining and reviewing outside history, ordering and reviewing medications, tests or procedures, care coordination (communications with other health care professionals or caregivers) and documentation in the medical record.

## 2023-02-04 NOTE — Assessment & Plan Note (Signed)
Sherry Barry is a 52 year old woman with stage IV triple positive breast cancer.  Stage IV breast cancer: Will proceed with treatment today with Herceptin, Perjeta, Zometa, and Zoladex.  She will also continue on tamoxifen daily.  She has no clinical signs of breast cancer progression.  Her most recent restaging scans occurred in July and showed no evidence of progression as well. Vaginal dryness: We discussed using an intimate cleanser such as refresh or Summers Eve's to keep her pH balanced.  We also discussed vitamin E suppositories that she can obtain from [place] called Key-E suppositories. 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  Sherry Barry has ran out of appointments.  I sent a scheduling message to get additional appointment scheduled.  She should return in 3 weeks for her follow-up and treatment.

## 2023-02-07 ENCOUNTER — Other Ambulatory Visit: Payer: Self-pay

## 2023-02-09 ENCOUNTER — Telehealth: Payer: Self-pay | Admitting: Adult Health

## 2023-02-09 NOTE — Telephone Encounter (Signed)
Scheduled appointments per WQ. Patient is aware of the made appointments.  

## 2023-02-11 ENCOUNTER — Other Ambulatory Visit: Payer: Self-pay

## 2023-02-17 DIAGNOSIS — K080 Exfoliation of teeth due to systemic causes: Secondary | ICD-10-CM | POA: Diagnosis not present

## 2023-02-18 ENCOUNTER — Telehealth: Payer: Self-pay

## 2023-02-18 NOTE — Telephone Encounter (Signed)
Spoke with patient informing her that her FMLA documents had been faxed to her company. Fax conformation received. Patient informed copy of documents place up front for pick up at next visit

## 2023-02-25 ENCOUNTER — Inpatient Hospital Stay: Payer: Federal, State, Local not specified - PPO | Attending: Hematology and Oncology | Admitting: Physician Assistant

## 2023-02-25 ENCOUNTER — Inpatient Hospital Stay: Payer: Federal, State, Local not specified - PPO

## 2023-02-25 DIAGNOSIS — Z923 Personal history of irradiation: Secondary | ICD-10-CM | POA: Insufficient documentation

## 2023-02-25 DIAGNOSIS — Z79810 Long term (current) use of selective estrogen receptor modulators (SERMs): Secondary | ICD-10-CM | POA: Diagnosis not present

## 2023-02-25 DIAGNOSIS — Z5112 Encounter for antineoplastic immunotherapy: Secondary | ICD-10-CM | POA: Diagnosis not present

## 2023-02-25 DIAGNOSIS — Z9013 Acquired absence of bilateral breasts and nipples: Secondary | ICD-10-CM | POA: Diagnosis not present

## 2023-02-25 DIAGNOSIS — Z170 Estrogen receptor positive status [ER+]: Secondary | ICD-10-CM | POA: Diagnosis not present

## 2023-02-25 DIAGNOSIS — C50211 Malignant neoplasm of upper-inner quadrant of right female breast: Secondary | ICD-10-CM | POA: Insufficient documentation

## 2023-02-25 DIAGNOSIS — C7951 Secondary malignant neoplasm of bone: Secondary | ICD-10-CM | POA: Insufficient documentation

## 2023-02-25 MED ORDER — SODIUM CHLORIDE 0.9 % IV SOLN
420.0000 mg | Freq: Once | INTRAVENOUS | Status: AC
  Administered 2023-02-25: 420 mg via INTRAVENOUS
  Filled 2023-02-25: qty 14

## 2023-02-25 MED ORDER — SODIUM CHLORIDE 0.9% FLUSH
10.0000 mL | INTRAVENOUS | Status: DC | PRN
  Administered 2023-02-25: 10 mL

## 2023-02-25 MED ORDER — SODIUM CHLORIDE 0.9 % IV SOLN: Freq: Once | INTRAVENOUS | Status: AC

## 2023-02-25 MED ORDER — TAMOXIFEN CITRATE 20 MG PO TABS: 20.0000 mg | ORAL_TABLET | Freq: Every day | ORAL | 3 refills | Status: DC

## 2023-02-25 MED ORDER — TRASTUZUMAB-DKST CHEMO 150 MG IV SOLR
6.0000 mg/kg | Freq: Once | INTRAVENOUS | Status: AC
  Administered 2023-02-25: 378 mg via INTRAVENOUS
  Filled 2023-02-25: qty 18

## 2023-02-25 MED ORDER — ACETAMINOPHEN 325 MG PO TABS
650.0000 mg | ORAL_TABLET | Freq: Once | ORAL | Status: AC
  Administered 2023-02-25: 650 mg via ORAL
  Filled 2023-02-25: qty 2

## 2023-02-25 MED ORDER — HEPARIN SOD (PORK) LOCK FLUSH 100 UNIT/ML IV SOLN
500.0000 [IU] | Freq: Once | INTRAVENOUS | Status: AC | PRN
  Administered 2023-02-25: 500 [IU]

## 2023-02-25 MED ORDER — DIPHENHYDRAMINE HCL 25 MG PO CAPS
25.0000 mg | ORAL_CAPSULE | Freq: Once | ORAL | Status: AC
  Administered 2023-02-25: 25 mg via ORAL
  Filled 2023-02-25: qty 1

## 2023-02-25 NOTE — Progress Notes (Signed)
[facility] Cancer Follow up:    Sherry Cipro, FNP [address] [place] [address]   DIAGNOSIS:  Cancer Staging  Malignant neoplasm of upper-inner quadrant of right breast in female, estrogen receptor positive (HCC) Staging form: Breast, AJCC 7th Edition - Clinical: Stage IA (T1c, N0, cM0) - Unsigned Specimen type: Core Needle Biopsy Histopathologic type: 9931 Laterality: Right Staging comments: Staged at breast conference 1.15.14  - Pathologic stage from 07/14/2012: Stage IV (TX, NX, M1) - Signed by Rachel Moulds, MD on 08/28/2021 Specimen type: Core Needle Biopsy Histopathologic type: 9931 Laterality: Right   SUMMARY OF ONCOLOGIC HISTORY: Oncology History  Malignant neoplasm of upper-inner quadrant of right breast in female, estrogen receptor positive (HCC)  07/13/2012 Clinical Stage   Stage IA: T1c N0   07/14/2012 Definitive Surgery   Bilateral mastectomy/right SLNB: RIGHT invasive ductal carcinoma, grade 3, ER+, PR +, Her2/neu positive (ratio 3.02), Ki67 48%. DCIS. 1/4 LN positive for malignancy. LEFT: benign   07/14/2012 Pathologic Stage   Stage IIB: T2 N1a M0   07/14/2012 Cancer Staging   Staging form: Breast, AJCC 7th Edition - Pathologic stage from 07/14/2012: Stage IV (TX, NX, M1) - Signed by Rachel Moulds, MD on 08/28/2021 Specimen type: Core Needle Biopsy Histopathologic type: 9931 Laterality: Right   08/17/2012 - 08/30/2013 Chemotherapy   Adjuvant carboplatin, docetaxel, and trastuzumab x 6 cycles (completed 11/30/2012) followed by maintenance trastuzumab to total one year   11/2012 Procedure   Comp Cancer Gene panel (GeneDx) negative for deleterious mutations    - 02/2013 Radiation Therapy   Adjuvant RT to right breast   02/2013 -  Anti-estrogen oral therapy   Tamoxifen 20 mg daily   09/24/2013 Surgery   Bilateral breast reconstruction with latissmus flap and expander placement   12/12/2013 Surgery   Implant placement     Relapse/Recurrence   Left sided malignant pleural effusion.  Tumor cells are positive for GATA3 and ER, negative for TTF-1 consistent with recurrent breast carcinoma Prognostic showed ER 90% positive strong staining, PR 80% positive strong staining, HER2 negative.    09/16/2021 Imaging   PET scan showed malignant left pleural effusion with extensive areas of nodularity and hypermetabolic activity involving the mediastinal border and pericardium as well as the most inferior aspects of the costodiaphragmatic recess.  Signs of nodal disease at the thoracic inlet on the left suspect extension of disease below the diaphragm along the left anterolateral aorta.  Nonspecific moderate to markedly increased metabolic activity about the base of the tongue bilaterally.  Consider direct visualization showing mildly asymmetric uptake favoring the right lingual tonsil.  Sclerotic foci without marked increased metabolic activity suspicious for metastatic disease perhaps treated or questions.  Heterogeneous marrow uptake seen elsewhere generalized and nonspecific.  Consider spinal MRI is warranted  MRI brain without any evidence of intracranial metastatic disease.   10/09/2021 - 02/11/2022 Chemotherapy   Taxotere, Herceptin, Perjeta x 6   12/04/2021 Imaging   There is no evidence new metastatic disease. There is interval decrease in the left pleural effusion possibly suggesting resolving pleural metastatic disease. Few scattered sclerotic metastatic lesions in the skeletal structures have not changed significantly.   No acute findings are seen in the chest abdomen and pelvis.     02/22/2022 -  Antibody Plan   Herceptin/Perjeta every 3 weeks   03/21/2022 Imaging   CT chest abdomen pelvis  IMPRESSION: 1. No significant change since previous study. Again demonstrated is a chronic small left pleural effusion with basilar  atelectasis and scattered sclerotic skeletal metastases. 2. No acute abnormalities are  demonstrated. 3. Small esophageal hiatal hernia.     Electronically Signed   By: Burman Nieves M.D.   On: 03/21/2022 20:23     CURRENT THERAPY:  Trastuzumab and Pertuzumab q 21 days Zometa and Zoladex q 3 months Tamoxifen  INTERVAL HISTORY:  Sherry Barry 52 y.o. female returns for follow-up prior to next cycle of Trastuzumab and Pertuzumab today.  She is unaccompanied for this visit.  She reports she is doing well without any new or concerning symptoms. Her energy and appetite are stable. She is able to complete all her ADLs on her own. She denies nausea, vomiting or abdominal pain. Her bowel habits are regular without recurrent episodes of diarrhea or constipation. She denies easy bruising or signs of bleeding. She denies fevers, chills, night sweats, shortness of breath, chest pain or cough.  She has no other complaints.  Rest of the pertinent 10 point ROS reviewed and negative.  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  MEDICAL HISTORY: Past Medical History:  Diagnosis Date   Allergy    Breast cancer (HCC)    Breast cancer (HCC)    right   History of chemotherapy    doxetaxel/carboplatin/trastuzumab   History of migraine    last one about a week ago   Hx of radiation therapy 01/01/13-  02/15/13   r chest wall, r supraclav/axilla 5040 cGy/28 sessions, scar boost 1000 cGy/5 sessions   Hypertension    no meds,   urgent care on pomana   Migraine    Migraine     SURGICAL HISTORY: Past Surgical History:  Procedure Laterality Date   ABDOMINAL HYSTERECTOMY     no salpingo-oophorectomy 2009   BREAST RECONSTRUCTION WITH PLACEMENT OF TISSUE EXPANDER AND FLEX HD (ACELLULAR HYDRATED DERMIS) Left 09/24/2013   IR IMAGING GUIDED PORT INSERTION  10/07/2021   IR THORACENTESIS ASP PLEURAL SPACE W/IMG GUIDE  08/11/2021   IR THORACENTESIS ASP PLEURAL SPACE W/IMG GUIDE  10/07/2021   LATISSIMUS FLAP TO BREAST Right 09/24/2013   Procedure: RIGHT LATISSMUS MYOCUTAEIOUS MUSCLE FLAP AND PLACEMENT OF TISSUE Lurena Nida;  Surgeon: Wayland Denis, DO;  Location: [facility];  Service: Plastics;  Laterality: Right;   LIPOSUCTION WITH LIPOFILLING Bilateral 12/12/2013   Procedure: LIPOSUCTION WITH LIPOFILLING;  Surgeon: Wayland Denis, DO;  Location: [facility];  Service: Plastics;  Laterality: Bilateral;   PORT-A-CATH REMOVAL Left 12/12/2013   Procedure: REMOVAL PORT-A-CATH;  Surgeon: Wayland Denis, DO;  Location: [facility];  Service: Plastics;  Laterality: Left;   PORTACATH PLACEMENT  07/14/2012   Procedure: INSERTION PORT-A-CATH;  Surgeon: Clovis Pu. Cornett, MD;  Location: [facility];  Service: General;  Laterality: Left;   RECONSTRUCTION BREAST W/ LATISSIMUS DORSI FLAP Right 09/24/2013   "& tissue expander placement"   REMOVAL OF BILATERAL TISSUE EXPANDERS WITH PLACEMENT OF BILATERAL BREAST  IMPLANTS Bilateral 12/12/2013   Procedure: REMOVAL OF BILATERAL TISSUE EXPANDERS WITH PLACEMENT OF BILATERAL BREAST IMPLANTS/BILATERAL CAPSULECTOMIES WITH  LIPOFILLING FAT GRAFTING;  Surgeon: Wayland Denis, DO;  Location: [facility];  Service: Plastics;  Laterality: Bilateral;   SIMPLE MASTECTOMY WITH AXILLARY SENTINEL NODE BIOPSY  07/14/2012   Procedure: SIMPLE MASTECTOMY WITH AXILLARY SENTINEL  NODE BIOPSY;  Surgeon: Clovis Pu. Cornett, MD;  Location: [facility];  Service: General;  Laterality: Right;  Bilateral simple mastectomy with port and right sebtibel lymph node mapping   SIMPLE MASTECTOMY WITH AXILLARY SENTINEL NODE BIOPSY  07/14/2012   Procedure: SIMPLE MASTECTOMY;  Surgeon: Clovis Pu. Cornett, MD;  Location: [facility];  Service: General;  Laterality: Left;   TISSUE EXPANDER PLACEMENT Left 09/24/2013   Procedure: PLACEMENT OF TISSUE EXPANDER AND FLEX HD TO LEFT BREAST;  Surgeon: Wayland Denis, DO;  Location: [facility];  Service: Plastics;  Laterality: Left;    SOCIAL HISTORY: Social History   Socioeconomic History   Marital status: Married    Spouse name: Not on file   Number of children: 2   Years of education: Not on file   Highest education level: Not on file  Occupational History    Employer: [employer]  Tobacco Use   Smoking status: Never   Smokeless tobacco: Never  Substance and Sexual Activity   Alcohol use: Yes    Comment: occasional   Drug use: No   Sexual activity: Yes    Birth control/protection: Surgical  Other Topics Concern   Not on file  Social History Narrative   Not on file   Social Determinants of Health   Financial Resource Strain: Not on file  Food Insecurity: Not on file  Transportation Needs: Not on file  Physical Activity: Not on file  Stress: Not on file  Social Connections: Not on file  Intimate Partner Violence: Not on file    FAMILY HISTORY: Family History  Problem Relation Age of Onset   Hypertension Mother    Alcohol abuse Mother    Heart disease Maternal Grandmother    Stroke Maternal Grandfather    Colon cancer Maternal Aunt 55       alive at 21   Brain cancer Maternal Uncle 78       and lymphoma in early 51s; deceased   Brain cancer Maternal Uncle 60       deceased   Pancreatic cancer Maternal Uncle 45       alive at 74   Breast cancer Cousin 88       mat 1st cousin once removed through mat GF ; deceased   Breast cancer  Maternal Aunt        great aunt through mat GF; dx at ? age    Review of Systems  Constitutional:  Negative for appetite change, chills, fatigue, fever and unexpected weight change.  HENT:   Negative for hearing loss, lump/mass and trouble swallowing.   Eyes:  Negative for eye problems and icterus.  Respiratory:  Negative for chest tightness and cough.   Cardiovascular:  Negative for chest pain, leg swelling and palpitations.  Gastrointestinal:  Negative for abdominal distention, abdominal pain, constipation, diarrhea, nausea and vomiting.  Endocrine: Negative for hot flashes.  Genitourinary:  Negative for difficulty urinating.   Musculoskeletal:  Negative for arthralgias.  Skin:  Negative for itching and rash.  Neurological:  Negative for dizziness, extremity weakness, headaches and numbness.  Hematological:  Negative for adenopathy. Does not bruise/bleed easily.  Psychiatric/Behavioral:  Negative for depression. The patient is not nervous/anxious.       PHYSICAL EXAMINATION  ECOG PERFORMANCE STATUS: 1 - Symptomatic but completely ambulatory  Vitals:   02/25/23 1028 02/25/23 1029  BP: (!) 127/99 (!) 123/99  Pulse: (!) 105 (!) 102  Resp: 17   Temp: 98.8 F (37.1 C)   SpO2: 100%      Physical Exam Constitutional:      General: She is not in acute distress.    Appearance: Normal appearance. She is not toxic-appearing.  HENT:     Head: Normocephalic and atraumatic.  Eyes:     General: No scleral icterus. Cardiovascular:     Rate and Rhythm: Normal rate and regular rhythm.     Heart sounds: Normal heart sounds.  Pulmonary:     Effort: Pulmonary effort is normal.     Breath sounds: Normal breath sounds.  Musculoskeletal:        General: No swelling.     Cervical back: Neck supple.  Lymphadenopathy:     Cervical: No cervical adenopathy.  Skin:    General: Skin is warm and dry.     Findings: No rash.  Neurological:     General: No focal deficit present.     Mental  Status: She is alert.  Psychiatric:        Mood and Affect: Mood normal.        Behavior: Behavior normal.     LABORATORY DATA:  CBC    Component Value Date/Time   WBC 5.3 02/04/2023 1001   WBC 5.4 09/13/2022 1656   RBC 4.82 02/04/2023 1001   HGB 14.5 02/04/2023 1001   HGB 15.9 10/30/2019 1030   HGB 14.7 02/15/2017 0852   HCT 40.5 02/04/2023 1001   HCT 46.1 10/30/2019 1030   HCT 42.9 02/15/2017 0852   PLT 236 02/04/2023 1001   PLT 245 10/30/2019 1030   MCV 84.0 02/04/2023 1001   MCV 88 10/30/2019 1030   MCV 86.7 02/15/2017 0852   MCH 30.1 02/04/2023 1001   MCHC 35.8 02/04/2023 1001   RDW 12.4 02/04/2023 1001   RDW 12.5 10/30/2019 1030   RDW 12.6 02/15/2017 0852   LYMPHSABS 1.7 02/04/2023 1001   LYMPHSABS 1.4 02/15/2017 0852   MONOABS 0.5 02/04/2023 1001   MONOABS 0.3 02/15/2017 0852   EOSABS 0.2 02/04/2023 1001   EOSABS 0.1 02/15/2017 0852   BASOSABS 0.0 02/04/2023 1001   BASOSABS 0.0 02/15/2017 0852    CMP     Component Value Date/Time   NA 142 02/04/2023 1001   NA 140 10/30/2019 1030   NA 141 02/15/2017 0852   K 4.0 02/04/2023 1001   K 3.9 02/15/2017 0852   CL 110 02/04/2023 1001   CL 101 11/15/2012 0904   CO2 27 02/04/2023 1001   CO2 25 02/15/2017 0852   GLUCOSE 84 02/04/2023 1001   GLUCOSE 87 02/15/2017 0852   GLUCOSE 69 (L) 11/15/2012 0904   BUN 11 02/04/2023 1001   BUN 11 10/30/2019 1030   BUN 12.5 02/15/2017 0852   CREATININE 0.73 02/04/2023 1001   CREATININE 0.9 02/15/2017 0852   CALCIUM 8.9 02/04/2023 1001   CALCIUM 9.3 02/15/2017 0852   PROT 6.9 02/04/2023 1001   PROT 6.7 10/30/2019 1030   PROT 7.0 02/15/2017 0852   ALBUMIN 4.2 02/04/2023 1001   ALBUMIN 4.2 10/30/2019 1030   ALBUMIN 3.6 02/15/2017 0852   AST 15 02/04/2023 1001   AST 16 02/15/2017 0852   ALT 12 02/04/2023  1001   ALT 13 02/15/2017 0852   ALKPHOS 42 02/04/2023 1001   ALKPHOS 63 02/15/2017 0852   BILITOT 0.4 02/04/2023 1001   BILITOT 0.69 02/15/2017 0852   GFRNONAA  >60 02/04/2023 1001   GFRAA 100 10/30/2019 1030     ASSESSMENT and THERAPY PLAN:  Sherry Barry is a 52 y.o. female who returns for a follow-up for stage IV triple positive breast cancer.  #Stage IV triple positive breast cancer: --Currently treatment includes Trastuzumab plus Pertuzumab q 21 days, Zometa and Zoladex q 3 months and Tamoxifen.  --Most recent CT scan from 12/29/2022 showed no evidence of new or progressive disease. Unchanged residual left sided pleural thicking and sclerotic osseous metastases.  PLAN: --Due for Cycle 25 of Trastuzumab plus Pertuzumab. --No prohibitive toxicities so can proceed with treatment today --Most recent ECHO from 01/14/2023 shows EF of 55-60% with  normal strain. Under the care of cardiology and plan to repeat echo in 6 months --Most recent zometa and zoladex treatment on 02/04/2023. Next one due in November 2024.  --Continue with Tamoxifen. Refill sent --RTC in 3 weeks with labs and follow up before Cycle 26   All questions were answered. The patient knows to call the clinic with any problems, questions or concerns. We can certainly see the patient much sooner if necessary.  I have spent a total of 30 minutes minutes of face-to-face and non-face-to-face time, preparing to see the patient,performing a medically appropriate examination, counseling and educating the patient, ordering medications/tests, documenting clinical information in the electronic health record,  and care coordination.   Georga Kaufmann PA-C Dept of Hematology and Oncology [facility] Phone: [phone]

## 2023-03-03 ENCOUNTER — Other Ambulatory Visit: Payer: Self-pay

## 2023-03-18 ENCOUNTER — Inpatient Hospital Stay
Payer: Federal, State, Local not specified - PPO | Attending: Hematology and Oncology | Admitting: Hematology and Oncology

## 2023-03-18 ENCOUNTER — Inpatient Hospital Stay: Payer: Federal, State, Local not specified - PPO

## 2023-03-18 VITALS — BP 136/93 | HR 88 | Temp 98.6°F | Resp 17 | Ht 62.0 in | Wt 136.3 lb

## 2023-03-18 DIAGNOSIS — Z170 Estrogen receptor positive status [ER+]: Secondary | ICD-10-CM

## 2023-03-18 DIAGNOSIS — C50211 Malignant neoplasm of upper-inner quadrant of right female breast: Secondary | ICD-10-CM

## 2023-03-18 DIAGNOSIS — Z9013 Acquired absence of bilateral breasts and nipples: Secondary | ICD-10-CM | POA: Insufficient documentation

## 2023-03-18 DIAGNOSIS — Z5112 Encounter for antineoplastic immunotherapy: Secondary | ICD-10-CM | POA: Diagnosis not present

## 2023-03-18 DIAGNOSIS — Z923 Personal history of irradiation: Secondary | ICD-10-CM | POA: Diagnosis not present

## 2023-03-18 DIAGNOSIS — Z9221 Personal history of antineoplastic chemotherapy: Secondary | ICD-10-CM | POA: Insufficient documentation

## 2023-03-18 DIAGNOSIS — C7951 Secondary malignant neoplasm of bone: Secondary | ICD-10-CM | POA: Insufficient documentation

## 2023-03-18 MED ORDER — ACETAMINOPHEN 325 MG PO TABS
650.0000 mg | ORAL_TABLET | Freq: Once | ORAL | Status: AC
  Administered 2023-03-18: 650 mg via ORAL
  Filled 2023-03-18: qty 2

## 2023-03-18 MED ORDER — TRASTUZUMAB-DKST CHEMO 150 MG IV SOLR
6.0000 mg/kg | Freq: Once | INTRAVENOUS | Status: AC
  Administered 2023-03-18: 378 mg via INTRAVENOUS
  Filled 2023-03-18: qty 18

## 2023-03-18 MED ORDER — SODIUM CHLORIDE 0.9 % IV SOLN
420.0000 mg | Freq: Once | INTRAVENOUS | Status: AC
  Administered 2023-03-18: 420 mg via INTRAVENOUS
  Filled 2023-03-18: qty 14

## 2023-03-18 MED ORDER — DIPHENHYDRAMINE HCL 25 MG PO CAPS
25.0000 mg | ORAL_CAPSULE | Freq: Once | ORAL | Status: AC
  Administered 2023-03-18: 25 mg via ORAL
  Filled 2023-03-18: qty 1

## 2023-03-18 MED ORDER — SODIUM CHLORIDE 0.9 % IV SOLN: Freq: Once | INTRAVENOUS | Status: AC

## 2023-03-18 NOTE — Progress Notes (Signed)
[facility] Cancer Follow up:    Sherry Cipro, FNP [address] [place] [address]   DIAGNOSIS:  Cancer Staging  Malignant neoplasm of upper-inner quadrant of right breast in female, estrogen receptor positive (HCC) Staging form: Breast, AJCC 7th Edition - Clinical: Stage IA (T1c, N0, cM0) - Unsigned Specimen type: Core Needle Biopsy Histopathologic type: 9931 Laterality: Right Staging comments: Staged at breast conference 1.15.14  - Pathologic stage from 07/14/2012: Stage IV (TX, NX, M1) - Signed by Rachel Moulds, MD on 08/28/2021 Specimen type: Core Needle Biopsy Histopathologic type: 9931 Laterality: Right   SUMMARY OF ONCOLOGIC HISTORY: Oncology History  Malignant neoplasm of upper-inner quadrant of right breast in female, estrogen receptor positive (HCC)  07/13/2012 Clinical Stage   Stage IA: T1c N0   07/14/2012 Definitive Surgery   Bilateral mastectomy/right SLNB: RIGHT invasive ductal carcinoma, grade 3, ER+, PR +, Her2/neu positive (ratio 3.02), Ki67 48%. DCIS. 1/4 LN positive for malignancy. LEFT: benign   07/14/2012 Pathologic Stage   Stage IIB: T2 N1a M0   07/14/2012 Cancer Staging   Staging form: Breast, AJCC 7th Edition - Pathologic stage from 07/14/2012: Stage IV (TX, NX, M1) - Signed by Rachel Moulds, MD on 08/28/2021 Specimen type: Core Needle Biopsy Histopathologic type: 9931 Laterality: Right   08/17/2012 - 08/30/2013 Chemotherapy   Adjuvant carboplatin, docetaxel, and trastuzumab x 6 cycles (completed 11/30/2012) followed by maintenance trastuzumab to total one year   11/2012 Procedure   Comp Cancer Gene panel (GeneDx) negative for deleterious mutations    - 02/2013 Radiation Therapy   Adjuvant RT to right breast   02/2013 -  Anti-estrogen oral therapy   Tamoxifen 20 mg daily   09/24/2013 Surgery   Bilateral breast reconstruction with latissmus flap and expander placement   12/12/2013 Surgery   Implant placement     Relapse/Recurrence   Left sided malignant pleural effusion.  Tumor cells are positive for GATA3 and ER, negative for TTF-1 consistent with recurrent breast carcinoma Prognostic showed ER 90% positive strong staining, PR 80% positive strong staining, HER2 negative.    09/16/2021 Imaging   PET scan showed malignant left pleural effusion with extensive areas of nodularity and hypermetabolic activity involving the mediastinal border and pericardium as well as the most inferior aspects of the costodiaphragmatic recess.  Signs of nodal disease at the thoracic inlet on the left suspect extension of disease below the diaphragm along the left anterolateral aorta.  Nonspecific moderate to markedly increased metabolic activity about the base of the tongue bilaterally.  Consider direct visualization showing mildly asymmetric uptake favoring the right lingual tonsil.  Sclerotic foci without marked increased metabolic activity suspicious for metastatic disease perhaps treated or questions.  Heterogeneous marrow uptake seen elsewhere generalized and nonspecific.  Consider spinal MRI is warranted  MRI brain without any evidence of intracranial metastatic disease.   10/09/2021 - 02/11/2022 Chemotherapy   Taxotere, Herceptin, Perjeta x 6   12/04/2021 Imaging   There is no evidence new metastatic disease. There is interval decrease in the left pleural effusion possibly suggesting resolving pleural metastatic disease. Few scattered sclerotic metastatic lesions in the skeletal structures have not changed significantly.   No acute findings are seen in the chest abdomen and pelvis.     02/22/2022 -  Antibody Plan   Herceptin/Perjeta every 3 weeks   03/21/2022 Imaging   CT chest abdomen pelvis  IMPRESSION: 1. No significant change since previous study. Again demonstrated is a chronic small left pleural effusion with basilar  atelectasis and scattered sclerotic skeletal metastases. 2. No acute abnormalities are  demonstrated. 3. Small esophageal hiatal hernia.     Electronically Signed   By: Burman Nieves M.D.   On: 03/21/2022 20:23     CURRENT THERAPY:Herceptin/Perjeta; tamoxifen  INTERVAL HISTORY:  Sherry Barry 52 y.o. female returns for f/u of her metastatic breast cancer prior to receiving Herceptin/Perjeta.    The patient, with a history of cancer, is currently on a regimen of Herceptin and Perjeta infusions every 21 days, Tamoxifen, and Zoladex every three months. She also receives Zometa for bone strengthening. She has been managing well on this regimen, with no new symptoms or side effects reported. She has been experiencing leg cramps, which have been managed with magnesium glycinate. The patient has been doing well overall, with no new complaints or concerns. She has been compliant with her treatment regimen and has been maintaining regular follow-up appointments.  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  MEDICAL HISTORY: Past Medical History:  Diagnosis Date   Allergy    Breast cancer (HCC)    Breast cancer (HCC)    right   History of chemotherapy    doxetaxel/carboplatin/trastuzumab   History of migraine    last one about a week ago    Hx of radiation therapy 01/01/13- 02/15/13   r chest wall, r supraclav/axilla 5040 cGy/28 sessions, scar boost 1000 cGy/5 sessions   Hypertension    no meds,   urgent care on pomana   Migraine    Migraine     SURGICAL HISTORY: Past Surgical History:  Procedure Laterality Date   ABDOMINAL HYSTERECTOMY     no salpingo-oophorectomy 2009   BREAST RECONSTRUCTION WITH PLACEMENT OF TISSUE EXPANDER AND FLEX HD (ACELLULAR HYDRATED DERMIS) Left 09/24/2013   IR IMAGING GUIDED PORT INSERTION  10/07/2021   IR THORACENTESIS ASP PLEURAL SPACE W/IMG GUIDE  08/11/2021   IR THORACENTESIS ASP PLEURAL SPACE W/IMG GUIDE  10/07/2021   LATISSIMUS FLAP TO BREAST Right 09/24/2013   Procedure: RIGHT LATISSMUS MYOCUTAEIOUS MUSCLE FLAP AND PLACEMENT OF TISSUE Lurena Nida;  Surgeon: Wayland Denis, DO;  Location: [facility];  Service: Plastics;  Laterality: Right;   LIPOSUCTION WITH LIPOFILLING Bilateral 12/12/2013   Procedure: LIPOSUCTION WITH LIPOFILLING;  Surgeon: Wayland Denis, DO;  Location: [facility];  Service: Plastics;  Laterality: Bilateral;   PORT-A-CATH REMOVAL Left 12/12/2013   Procedure: REMOVAL PORT-A-CATH;  Surgeon: Wayland Denis, DO;  Location: [facility];  Service: Plastics;  Laterality: Left;   PORTACATH PLACEMENT  07/14/2012   Procedure: INSERTION PORT-A-CATH;  Surgeon: Clovis Pu. Cornett, MD;  Location: [facility];  Service: General;  Laterality: Left;   RECONSTRUCTION BREAST W/ LATISSIMUS DORSI FLAP Right 09/24/2013   "& tissue expander placement"   REMOVAL OF BILATERAL TISSUE EXPANDERS WITH PLACEMENT OF BILATERAL BREAST IMPLANTS Bilateral 12/12/2013  Procedure: REMOVAL OF BILATERAL TISSUE EXPANDERS WITH PLACEMENT OF BILATERAL BREAST IMPLANTS/BILATERAL CAPSULECTOMIES WITH  LIPOFILLING FAT GRAFTING;  Surgeon: Wayland Denis, DO;  Location: [facility];  Service: Plastics;  Laterality: Bilateral;   SIMPLE MASTECTOMY WITH AXILLARY SENTINEL NODE BIOPSY  07/14/2012   Procedure:  SIMPLE MASTECTOMY WITH AXILLARY SENTINEL NODE BIOPSY;  Surgeon: Clovis Pu. Cornett, MD;  Location: [facility];  Service: General;  Laterality: Right;  Bilateral simple mastectomy with port and right sebtibel lymph node mapping   SIMPLE MASTECTOMY WITH AXILLARY SENTINEL NODE BIOPSY  07/14/2012   Procedure: SIMPLE MASTECTOMY;  Surgeon: Clovis Pu. Cornett, MD;  Location: [facility];  Service: General;  Laterality: Left;   TISSUE EXPANDER PLACEMENT Left 09/24/2013   Procedure: PLACEMENT OF TISSUE EXPANDER AND FLEX HD TO LEFT BREAST;  Surgeon: Wayland Denis, DO;  Location: [facility];  Service: Plastics;  Laterality: Left;    SOCIAL HISTORY: Social History   Socioeconomic History   Marital status: Married    Spouse name: Not on file   Number of children: 2   Years of education: Not on file   Highest education level: Not on file  Occupational History    Employer: [employer]  Tobacco Use   Smoking status: Never   Smokeless tobacco: Never  Substance and Sexual Activity   Alcohol use: Yes    Comment: occasional   Drug use: No   Sexual activity: Yes    Birth control/protection: Surgical  Other Topics Concern   Not on file  Social History Narrative   Not on file   Social Determinants of Health   Financial Resource Strain: Not on file  Food Insecurity: Not on file  Transportation Needs: Not on file  Physical Activity: Not on file  Stress: Not on file  Social Connections: Not on file  Intimate Partner Violence: Not on file    FAMILY HISTORY: Family History  Problem Relation Age of Onset   Hypertension Mother    Alcohol abuse Mother    Heart disease Maternal Grandmother    Stroke Maternal Grandfather    Colon cancer Maternal Aunt 55       alive at 91   Brain cancer Maternal Uncle 40       and lymphoma in early 3s; deceased   Brain cancer Maternal Uncle 60       deceased   Pancreatic cancer Maternal Uncle 45       alive at 39   Breast cancer Cousin 89       mat 1st cousin once removed  through mat GF ; deceased   Breast cancer Maternal Aunt        great aunt through mat GF; dx at ? age    Review of Systems  Constitutional:  Negative for appetite change, chills, fatigue, fever and unexpected weight change.  HENT:   Negative for hearing loss, lump/mass and trouble swallowing.   Eyes:  Negative for eye problems and icterus.  Respiratory:  Negative for chest tightness, cough and shortness of breath.   Cardiovascular:  Negative for chest pain, leg swelling and palpitations.  Gastrointestinal:  Negative for abdominal distention, abdominal pain, constipation, diarrhea, nausea and vomiting.  Endocrine: Negative for hot flashes.  Genitourinary:  Negative for difficulty urinating.   Musculoskeletal:  Negative for arthralgias.  Skin:  Negative for itching and rash.  Neurological:  Negative for dizziness, extremity weakness, headaches and numbness.  Hematological:  Negative for adenopathy. Does not bruise/bleed easily.  Psychiatric/Behavioral:  Negative  for depression. The patient is not nervous/anxious.       PHYSICAL EXAMINATION    Vitals:   03/18/23 1153  BP: (!) 136/93  Pulse: 88  Resp: 17  Temp: 98.6 F (37 C)  SpO2: 98%    Physical Exam Constitutional:      General: She is not in acute distress.    Appearance: Normal appearance. She is not toxic-appearing.  HENT:     Head: Normocephalic and atraumatic.  Eyes:     General: No scleral icterus. Cardiovascular:     Rate and Rhythm: Normal rate and regular rhythm.     Pulses: Normal pulses.     Heart sounds: Normal heart sounds.  Pulmonary:     Effort: Pulmonary effort is normal.     Breath sounds: Normal breath sounds.  Abdominal:     General: Abdomen is flat. Bowel sounds are normal. There is no distension.     Palpations: Abdomen is soft.     Tenderness: There is no abdominal tenderness.  Musculoskeletal:        General: No swelling.     Cervical back: Neck supple.  Lymphadenopathy:     Cervical:  No cervical adenopathy.  Skin:    General: Skin is warm and dry.     Findings: No rash.  Neurological:     General: No focal deficit present.     Mental Status: She is alert.  Psychiatric:        Mood and Affect: Mood normal.        Behavior: Behavior normal.     LABORATORY DATA:  CBC    Component Value Date/Time   WBC 5.3 02/04/2023 1001   WBC 5.4 09/13/2022 1656   RBC 4.82 02/04/2023 1001   HGB 14.5 02/04/2023 1001   HGB 15.9 10/30/2019 1030   HGB 14.7 02/15/2017 0852   HCT 40.5 02/04/2023 1001   HCT 46.1 10/30/2019 1030   HCT 42.9 02/15/2017 0852   PLT 236 02/04/2023 1001   PLT 245 10/30/2019 1030   MCV 84.0 02/04/2023 1001   MCV 88 10/30/2019 1030   MCV 86.7 02/15/2017 0852   MCH 30.1 02/04/2023 1001   MCHC 35.8 02/04/2023 1001   RDW 12.4 02/04/2023 1001   RDW 12.5 10/30/2019 1030   RDW 12.6 02/15/2017 0852   LYMPHSABS 1.7 02/04/2023 1001   LYMPHSABS 1.4 02/15/2017 0852   MONOABS 0.5 02/04/2023 1001   MONOABS 0.3 02/15/2017 0852   EOSABS 0.2 02/04/2023 1001   EOSABS 0.1 02/15/2017 0852   BASOSABS 0.0 02/04/2023 1001   BASOSABS 0.0 02/15/2017 0852    CMP     Component Value Date/Time   NA 142 02/04/2023 1001   NA 140 10/30/2019 1030   NA 141 02/15/2017 0852   K 4.0 02/04/2023 1001   K 3.9 02/15/2017 0852   CL 110 02/04/2023 1001   CL 101 11/15/2012 0904   CO2 27 02/04/2023 1001   CO2 25 02/15/2017 0852   GLUCOSE 84 02/04/2023 1001   GLUCOSE 87 02/15/2017 0852   GLUCOSE 69 (L) 11/15/2012 0904   BUN 11 02/04/2023 1001   BUN 11 10/30/2019 1030   BUN 12.5 02/15/2017 0852   CREATININE 0.73 02/04/2023 1001   CREATININE 0.9 02/15/2017 0852   CALCIUM 8.9 02/04/2023 1001   CALCIUM 9.3 02/15/2017 0852   PROT 6.9 02/04/2023 1001   PROT 6.7 10/30/2019 1030   PROT 7.0 02/15/2017 0852   ALBUMIN 4.2 02/04/2023 1001   ALBUMIN 4.2 10/30/2019 1030  ALBUMIN 3.6 02/15/2017 0852   AST 15 02/04/2023 1001   AST 16 02/15/2017 0852   ALT 12 02/04/2023 1001    ALT 13 02/15/2017 0852   ALKPHOS 42 02/04/2023 1001   ALKPHOS 63 02/15/2017 0852   BILITOT 0.4 02/04/2023 1001   BILITOT 0.69 02/15/2017 0852   GFRNONAA >60 02/04/2023 1001   GFRAA 100 10/30/2019 1030       ASSESSMENT and THERAPY PLAN:   Malignant neoplasm of upper-inner quadrant of right breast in female, estrogen receptor positive (HCC) Sherry Barry is a 52 year old woman with stage IV triple positive breast cancer on maintenance herceptin/perjeta.  Metastatic Breast Cancer Stable on current regimen of Herceptin, Perjeta, and Tamoxifen. No new symptoms reported. Discussed the possibility of switching to subcutaneous injection ([place]) if insurance approves and patient desires. -Continue Herceptin and Perjeta every 21 days. -Continue Tamoxifen daily. -Consider switching to phesgo if insurance approves and patient desires. -Continue to monitor heart every 6 months per Dr Shirlee Latch  Zoladex and Leg Cramps Patient reports leg cramps potentially related to Zoladex. Discussed the option of discontinuing Zoladex and continuing Tamoxifen alone. Patient has started taking magnesium glycinate which has helped with cramps. -Continue Zoladex every 3 months if leg cramps are manageable. -Consider discontinuing Zoladex if leg cramps become intolerable.  Bone Health Currently on a bone strengthener (Zometa) every 3 months. -Continue Zometa every 3 months. No new dental concerns reported.  Follow-up Patient is doing well on current regimen. Discussed stretching appointments to every 3 months. -Schedule follow-up appointment in 3 months. -Continue Herceptin and Perjeta infusions every 21 days. -Continue Zoladex and Zometa every 3 months.     All questions were answered. The patient knows to call the clinic with any problems, questions or concerns. We can certainly see the patient much sooner if necessary.  Total encounter time:30 minutes*in face-to-face visit time, chart review, lab review, care  coordination, order entry, and documentation of the encounter time.  *Total Encounter Time as defined by the Centers for Medicare and Medicaid Services includes, in addition to the face-to-face time of a patient visit (documented in the note above) non-face-to-face time: obtaining and reviewing outside history, ordering and reviewing medications, tests or procedures, care coordination (communications with other health care professionals or caregivers) and documentation in the medical record.

## 2023-03-18 NOTE — Assessment & Plan Note (Signed)
Sherry Barry is a 52 year old woman with stage IV triple positive breast cancer on maintenance herceptin/perjeta.  Metastatic Breast Cancer Stable on current regimen of Herceptin, Perjeta, and Tamoxifen. No new symptoms reported. Discussed the possibility of switching to subcutaneous injection ([place]) if insurance approves and patient desires. -Continue Herceptin and Perjeta every 21 days. -Continue Tamoxifen daily. -Consider switching to phesgo if insurance approves and patient desires. -Continue to monitor heart every 6 months per Dr Shirlee Latch  Zoladex and Leg Cramps Patient reports leg cramps potentially related to Zoladex. Discussed the option of discontinuing Zoladex and continuing Tamoxifen alone. Patient has started taking magnesium glycinate which has helped with cramps. -Continue Zoladex every 3 months if leg cramps are manageable. -Consider discontinuing Zoladex if leg cramps become intolerable.  Bone Health Currently on a bone strengthener (Zometa) every 3 months. -Continue Zometa every 3 months. No new dental concerns reported.  Follow-up Patient is doing well on current regimen. Discussed stretching appointments to every 3 months. -Schedule follow-up appointment in 3 months. -Continue Herceptin and Perjeta infusions every 21 days. -Continue Zoladex and Zometa every 3 months.

## 2023-03-19 ENCOUNTER — Other Ambulatory Visit: Payer: Self-pay

## 2023-03-27 ENCOUNTER — Other Ambulatory Visit: Payer: Self-pay

## 2023-03-31 ENCOUNTER — Other Ambulatory Visit: Payer: Self-pay | Admitting: Physician Assistant

## 2023-04-07 ENCOUNTER — Other Ambulatory Visit: Payer: Self-pay | Admitting: *Deleted

## 2023-04-07 DIAGNOSIS — Z170 Estrogen receptor positive status [ER+]: Secondary | ICD-10-CM

## 2023-04-08 ENCOUNTER — Inpatient Hospital Stay: Payer: Federal, State, Local not specified - PPO

## 2023-04-08 ENCOUNTER — Ambulatory Visit: Payer: Federal, State, Local not specified - PPO | Admitting: Hematology and Oncology

## 2023-04-08 VITALS — BP 126/94 | HR 87 | Temp 98.0°F | Resp 18 | Wt 140.5 lb

## 2023-04-08 DIAGNOSIS — C7951 Secondary malignant neoplasm of bone: Secondary | ICD-10-CM | POA: Diagnosis not present

## 2023-04-08 DIAGNOSIS — Z170 Estrogen receptor positive status [ER+]: Secondary | ICD-10-CM | POA: Diagnosis not present

## 2023-04-08 DIAGNOSIS — Z9013 Acquired absence of bilateral breasts and nipples: Secondary | ICD-10-CM | POA: Diagnosis not present

## 2023-04-08 DIAGNOSIS — Z5112 Encounter for antineoplastic immunotherapy: Secondary | ICD-10-CM | POA: Diagnosis not present

## 2023-04-08 DIAGNOSIS — C50211 Malignant neoplasm of upper-inner quadrant of right female breast: Secondary | ICD-10-CM | POA: Diagnosis not present

## 2023-04-08 DIAGNOSIS — Z923 Personal history of irradiation: Secondary | ICD-10-CM | POA: Diagnosis not present

## 2023-04-08 DIAGNOSIS — Z9221 Personal history of antineoplastic chemotherapy: Secondary | ICD-10-CM | POA: Diagnosis not present

## 2023-04-08 LAB — CBC WITH DIFFERENTIAL (CANCER CENTER ONLY)
Abs Immature Granulocytes: 0.01 10*3/uL (ref 0.00–0.07)
Basophils Absolute: 0 10*3/uL (ref 0.0–0.1)
Basophils Relative: 1 %
Eosinophils Absolute: 0.2 10*3/uL (ref 0.0–0.5)
Eosinophils Relative: 3 %
HCT: 41.1 % (ref 36.0–46.0)
Hemoglobin: 14.3 g/dL (ref 12.0–15.0)
Immature Granulocytes: 0 %
Lymphocytes Relative: 32 %
Lymphs Abs: 1.9 10*3/uL (ref 0.7–4.0)
MCH: 29.7 pg (ref 26.0–34.0)
MCHC: 34.8 g/dL (ref 30.0–36.0)
MCV: 85.4 fL (ref 80.0–100.0)
Monocytes Absolute: 0.5 10*3/uL (ref 0.1–1.0)
Monocytes Relative: 7 %
Neutro Abs: 3.5 10*3/uL (ref 1.7–7.7)
Neutrophils Relative %: 57 %
Platelet Count: 225 10*3/uL (ref 150–400)
RBC: 4.81 MIL/uL (ref 3.87–5.11)
RDW: 12.3 % (ref 11.5–15.5)
WBC Count: 6.2 10*3/uL (ref 4.0–10.5)
nRBC: 0 % (ref 0.0–0.2)

## 2023-04-08 LAB — CMP (CANCER CENTER ONLY)
ALT: 12 U/L (ref 0–44)
AST: 16 U/L (ref 15–41)
Albumin: 4.2 g/dL (ref 3.5–5.0)
Alkaline Phosphatase: 43 U/L (ref 38–126)
Anion gap: 7 (ref 5–15)
BUN: 15 mg/dL (ref 6–20)
CO2: 29 mmol/L (ref 22–32)
Calcium: 9.5 mg/dL (ref 8.9–10.3)
Chloride: 106 mmol/L (ref 98–111)
Creatinine: 0.65 mg/dL (ref 0.44–1.00)
GFR, Estimated: >60 mL/min (ref >60)
Glucose, Bld: 87 mg/dL (ref 70–99)
Potassium: 3.4 mmol/L — ABNORMAL LOW (ref 3.5–5.1)
Sodium: 142 mmol/L (ref 135–145)
Total Bilirubin: 0.3 mg/dL (ref 0.3–1.2)
Total Protein: 7 g/dL (ref 6.5–8.1)

## 2023-04-08 MED ORDER — TRASTUZUMAB-DKST CHEMO 150 MG IV SOLR
6.0000 mg/kg | Freq: Once | INTRAVENOUS | Status: AC
  Administered 2023-04-08: 378 mg via INTRAVENOUS
  Filled 2023-04-08: qty 18

## 2023-04-08 MED ORDER — HEPARIN SOD (PORK) LOCK FLUSH 100 UNIT/ML IV SOLN
500.0000 [IU] | Freq: Once | INTRAVENOUS | Status: AC | PRN
  Administered 2023-04-08: 500 [IU]

## 2023-04-08 MED ORDER — SODIUM CHLORIDE 0.9 % IV SOLN
420.0000 mg | Freq: Once | INTRAVENOUS | Status: AC
  Administered 2023-04-08: 420 mg via INTRAVENOUS
  Filled 2023-04-08: qty 14

## 2023-04-08 MED ORDER — SODIUM CHLORIDE 0.9% FLUSH
10.0000 mL | INTRAVENOUS | Status: DC | PRN
  Administered 2023-04-08: 10 mL

## 2023-04-08 MED ORDER — ACETAMINOPHEN 325 MG PO TABS
650.0000 mg | ORAL_TABLET | Freq: Once | ORAL | Status: AC
  Administered 2023-04-08: 650 mg via ORAL
  Filled 2023-04-08: qty 2

## 2023-04-08 MED ORDER — DIPHENHYDRAMINE HCL 25 MG PO CAPS
25.0000 mg | ORAL_CAPSULE | Freq: Once | ORAL | Status: AC
  Administered 2023-04-08: 25 mg via ORAL
  Filled 2023-04-08: qty 1

## 2023-04-08 MED ORDER — SODIUM CHLORIDE 0.9 % IV SOLN: Freq: Once | INTRAVENOUS | Status: AC

## 2023-04-08 NOTE — Progress Notes (Deleted)
Per Dr Leonides Schanz ok to treat with tachycardia today.

## 2023-04-08 NOTE — Progress Notes (Signed)
Patient declined to stay for 30 minute post perjeta observation. 

## 2023-04-18 DIAGNOSIS — C50911 Malignant neoplasm of unspecified site of right female breast: Secondary | ICD-10-CM | POA: Diagnosis not present

## 2023-04-18 DIAGNOSIS — Z9012 Acquired absence of left breast and nipple: Secondary | ICD-10-CM | POA: Diagnosis not present

## 2023-04-23 ENCOUNTER — Other Ambulatory Visit: Payer: Self-pay | Admitting: Hematology and Oncology

## 2023-04-25 ENCOUNTER — Encounter: Payer: Self-pay | Admitting: Hematology and Oncology

## 2023-04-25 DIAGNOSIS — F411 Generalized anxiety disorder: Secondary | ICD-10-CM | POA: Diagnosis not present

## 2023-04-25 DIAGNOSIS — Z23 Encounter for immunization: Secondary | ICD-10-CM | POA: Diagnosis not present

## 2023-04-25 DIAGNOSIS — Z1389 Encounter for screening for other disorder: Secondary | ICD-10-CM | POA: Diagnosis not present

## 2023-04-25 DIAGNOSIS — Z0000 Encounter for general adult medical examination without abnormal findings: Secondary | ICD-10-CM | POA: Diagnosis not present

## 2023-04-28 ENCOUNTER — Other Ambulatory Visit: Payer: Self-pay | Admitting: *Deleted

## 2023-04-28 DIAGNOSIS — C50211 Malignant neoplasm of upper-inner quadrant of right female breast: Secondary | ICD-10-CM

## 2023-04-29 ENCOUNTER — Inpatient Hospital Stay
Payer: Federal, State, Local not specified - PPO | Attending: Hematology and Oncology | Admitting: Hematology and Oncology

## 2023-04-29 ENCOUNTER — Ambulatory Visit: Payer: Federal, State, Local not specified - PPO | Admitting: Hematology and Oncology

## 2023-04-29 ENCOUNTER — Ambulatory Visit: Payer: Federal, State, Local not specified - PPO

## 2023-04-29 ENCOUNTER — Inpatient Hospital Stay: Payer: Federal, State, Local not specified - PPO | Attending: Hematology and Oncology

## 2023-04-29 ENCOUNTER — Inpatient Hospital Stay: Payer: Federal, State, Local not specified - PPO

## 2023-04-29 ENCOUNTER — Other Ambulatory Visit: Payer: Federal, State, Local not specified - PPO

## 2023-04-29 VITALS — BP 125/83 | HR 93 | Temp 98.7°F | Resp 17 | Wt 137.9 lb

## 2023-04-29 VITALS — BP 121/82 | HR 82 | Resp 16

## 2023-04-29 DIAGNOSIS — Z5112 Encounter for antineoplastic immunotherapy: Secondary | ICD-10-CM | POA: Diagnosis not present

## 2023-04-29 DIAGNOSIS — C7951 Secondary malignant neoplasm of bone: Secondary | ICD-10-CM | POA: Insufficient documentation

## 2023-04-29 DIAGNOSIS — Z170 Estrogen receptor positive status [ER+]: Secondary | ICD-10-CM

## 2023-04-29 DIAGNOSIS — Z9221 Personal history of antineoplastic chemotherapy: Secondary | ICD-10-CM | POA: Diagnosis not present

## 2023-04-29 DIAGNOSIS — C50211 Malignant neoplasm of upper-inner quadrant of right female breast: Secondary | ICD-10-CM | POA: Diagnosis not present

## 2023-04-29 DIAGNOSIS — Z79810 Long term (current) use of selective estrogen receptor modulators (SERMs): Secondary | ICD-10-CM | POA: Diagnosis not present

## 2023-04-29 DIAGNOSIS — Z5111 Encounter for antineoplastic chemotherapy: Secondary | ICD-10-CM | POA: Insufficient documentation

## 2023-04-29 DIAGNOSIS — Z923 Personal history of irradiation: Secondary | ICD-10-CM | POA: Diagnosis not present

## 2023-04-29 DIAGNOSIS — Z9013 Acquired absence of bilateral breasts and nipples: Secondary | ICD-10-CM | POA: Insufficient documentation

## 2023-04-29 LAB — CBC WITH DIFFERENTIAL (CANCER CENTER ONLY)
Abs Immature Granulocytes: 0.02 10*3/uL (ref 0.00–0.07)
Basophils Absolute: 0 10*3/uL (ref 0.0–0.1)
Basophils Relative: 0 %
Eosinophils Absolute: 0.8 10*3/uL — ABNORMAL HIGH (ref 0.0–0.5)
Eosinophils Relative: 13 %
HCT: 40.7 % (ref 36.0–46.0)
Hemoglobin: 14 g/dL (ref 12.0–15.0)
Immature Granulocytes: 0 %
Lymphocytes Relative: 28 %
Lymphs Abs: 1.9 10*3/uL (ref 0.7–4.0)
MCH: 29.4 pg (ref 26.0–34.0)
MCHC: 34.4 g/dL (ref 30.0–36.0)
MCV: 85.3 fL (ref 80.0–100.0)
Monocytes Absolute: 0.4 10*3/uL (ref 0.1–1.0)
Monocytes Relative: 7 %
Neutro Abs: 3.4 10*3/uL (ref 1.7–7.7)
Neutrophils Relative %: 52 %
Platelet Count: 223 10*3/uL (ref 150–400)
RBC: 4.77 MIL/uL (ref 3.87–5.11)
RDW: 12.3 % (ref 11.5–15.5)
WBC Count: 6.6 10*3/uL (ref 4.0–10.5)
nRBC: 0 % (ref 0.0–0.2)

## 2023-04-29 LAB — CMP (CANCER CENTER ONLY)
ALT: 13 U/L (ref 0–44)
AST: 15 U/L (ref 15–41)
Albumin: 4.1 g/dL (ref 3.5–5.0)
Alkaline Phosphatase: 46 U/L (ref 38–126)
Anion gap: 3 — ABNORMAL LOW (ref 5–15)
BUN: 10 mg/dL (ref 6–20)
CO2: 30 mmol/L (ref 22–32)
Calcium: 9.2 mg/dL (ref 8.9–10.3)
Chloride: 108 mmol/L (ref 98–111)
Creatinine: 0.64 mg/dL (ref 0.44–1.00)
GFR, Estimated: >60 mL/min (ref >60)
Glucose, Bld: 85 mg/dL (ref 70–99)
Potassium: 3.7 mmol/L (ref 3.5–5.1)
Sodium: 141 mmol/L (ref 135–145)
Total Bilirubin: 0.4 mg/dL (ref <1.2)
Total Protein: 6.8 g/dL (ref 6.5–8.1)

## 2023-04-29 MED ORDER — SODIUM CHLORIDE 0.9 % IV SOLN: Freq: Once | INTRAVENOUS | Status: AC

## 2023-04-29 MED ORDER — DIPHENHYDRAMINE HCL 25 MG PO CAPS
25.0000 mg | ORAL_CAPSULE | Freq: Once | ORAL | Status: AC
Start: 2023-04-29 — End: 2023-04-29
  Administered 2023-04-29: 25 mg via ORAL
  Filled 2023-04-29: qty 1

## 2023-04-29 MED ORDER — ACETAMINOPHEN 325 MG PO TABS
650.0000 mg | ORAL_TABLET | Freq: Once | ORAL | Status: AC
Start: 2023-04-29 — End: 2023-04-29
  Administered 2023-04-29: 650 mg via ORAL
  Filled 2023-04-29: qty 2

## 2023-04-29 MED ORDER — GOSERELIN ACETATE 10.8 MG ~~LOC~~ IMPL
10.8000 mg | DRUG_IMPLANT | Freq: Once | SUBCUTANEOUS | Status: AC
  Administered 2023-04-29: 10.8 mg via SUBCUTANEOUS
  Filled 2023-04-29: qty 10.8

## 2023-04-29 MED ORDER — TRASTUZUMAB-DKST CHEMO 150 MG IV SOLR
6.0000 mg/kg | Freq: Once | INTRAVENOUS | Status: AC
  Administered 2023-04-29: 378 mg via INTRAVENOUS
  Filled 2023-04-29: qty 18

## 2023-04-29 MED ORDER — ZOLEDRONIC ACID 4 MG/100ML IV SOLN
4.0000 mg | Freq: Once | INTRAVENOUS | Status: AC
  Administered 2023-04-29: 4 mg via INTRAVENOUS
  Filled 2023-04-29: qty 100

## 2023-04-29 MED ORDER — SODIUM CHLORIDE 0.9 % IV SOLN
420.0000 mg | Freq: Once | INTRAVENOUS | Status: AC
  Administered 2023-04-29: 420 mg via INTRAVENOUS
  Filled 2023-04-29: qty 14

## 2023-04-29 MED ORDER — GABAPENTIN 300 MG PO CAPS: 300.0000 mg | ORAL_CAPSULE | Freq: Every day | ORAL | 3 refills | Status: DC

## 2023-04-29 MED ORDER — HEPARIN SOD (PORK) LOCK FLUSH 100 UNIT/ML IV SOLN
500.0000 [IU] | Freq: Once | INTRAVENOUS | Status: AC | PRN
  Administered 2023-04-29: 500 [IU]

## 2023-04-29 MED ORDER — SODIUM CHLORIDE 0.9% FLUSH
10.0000 mL | INTRAVENOUS | Status: DC | PRN
  Administered 2023-04-29: 10 mL

## 2023-04-29 MED ORDER — SODIUM CHLORIDE 0.9% FLUSH
10.0000 mL | Freq: Once | INTRAVENOUS | Status: AC
  Administered 2023-04-29: 10 mL

## 2023-04-29 NOTE — Progress Notes (Signed)
Patient refused 30 minute observation, VS stable.

## 2023-04-29 NOTE — Progress Notes (Signed)
[facility] Cancer Follow up:    Sherry Cipro, FNP [address] [place] [address]   DIAGNOSIS:  Cancer Staging  Malignant neoplasm of upper-inner quadrant of right breast in female, estrogen receptor positive (HCC) Staging form: Breast, AJCC 7th Edition - Clinical: Stage IA (T1c, N0, cM0) - Unsigned Specimen type: Core Needle Biopsy Histopathologic type: 9931 Laterality: Right Staging comments: Staged at breast conference 1.15.14  - Pathologic stage from 07/14/2012: Stage IV (TX, NX, M1) - Signed by Rachel Moulds, MD on 08/28/2021 Specimen type: Core Needle Biopsy Histopathologic type: 9931 Laterality: Right   SUMMARY OF ONCOLOGIC HISTORY: Oncology History  Malignant neoplasm of upper-inner quadrant of right breast in female, estrogen receptor positive (HCC)  07/13/2012 Clinical Stage   Stage IA: T1c N0   07/14/2012 Definitive Surgery   Bilateral mastectomy/right SLNB: RIGHT invasive ductal carcinoma, grade 3, ER+, PR +, Her2/neu positive (ratio 3.02), Ki67 48%. DCIS. 1/4 LN positive for malignancy. LEFT: benign   07/14/2012 Pathologic Stage   Stage IIB: T2 N1a M0   07/14/2012 Cancer Staging   Staging form: Breast, AJCC 7th Edition - Pathologic stage from 07/14/2012: Stage IV (TX, NX, M1) - Signed by Rachel Moulds, MD on 08/28/2021 Specimen type: Core Needle Biopsy Histopathologic type: 9931 Laterality: Right   08/17/2012 - 08/30/2013 Chemotherapy   Adjuvant carboplatin, docetaxel, and trastuzumab x 6 cycles (completed 11/30/2012) followed by maintenance trastuzumab to total one year   11/2012 Procedure   Comp Cancer Gene panel (GeneDx) negative for deleterious mutations    - 02/2013 Radiation Therapy   Adjuvant RT to right breast   02/2013 -  Anti-estrogen oral therapy   Tamoxifen 20 mg daily   09/24/2013 Surgery   Bilateral breast reconstruction with latissmus flap and expander placement   12/12/2013 Surgery   Implant placement     Relapse/Recurrence   Left sided malignant pleural effusion.  Tumor cells are positive for GATA3 and ER, negative for TTF-1 consistent with recurrent breast carcinoma Prognostic showed ER 90% positive strong staining, PR 80% positive strong staining, HER2 negative.    09/16/2021 Imaging   PET scan showed malignant left pleural effusion with extensive areas of nodularity and hypermetabolic activity involving the mediastinal border and pericardium as well as the most inferior aspects of the costodiaphragmatic recess.  Signs of nodal disease at the thoracic inlet on the left suspect extension of disease below the diaphragm along the left anterolateral aorta.  Nonspecific moderate to markedly increased metabolic activity about the base of the tongue bilaterally.  Consider direct visualization showing mildly asymmetric uptake favoring the right lingual tonsil.  Sclerotic foci without marked increased metabolic activity suspicious for metastatic disease perhaps treated or questions.  Heterogeneous marrow uptake seen elsewhere generalized and nonspecific.  Consider spinal MRI is warranted  MRI brain without any evidence of intracranial metastatic disease.   10/09/2021 - 02/11/2022 Chemotherapy   Taxotere, Herceptin, Perjeta x 6   12/04/2021 Imaging   There is no evidence new metastatic disease. There is interval decrease in the left pleural effusion possibly suggesting resolving pleural metastatic disease. Few scattered sclerotic metastatic lesions in the skeletal structures have not changed significantly.   No acute findings are seen in the chest abdomen and pelvis.     02/22/2022 -  Antibody Plan   Herceptin/Perjeta every 3 weeks   03/21/2022 Imaging   CT chest abdomen pelvis  IMPRESSION: 1. No significant change since previous study. Again demonstrated is a chronic small left pleural effusion with basilar  atelectasis and scattered sclerotic skeletal metastases. 2. No acute abnormalities are  demonstrated. 3. Small esophageal hiatal hernia.     Electronically Signed   By: Burman Nieves M.D.   On: 03/21/2022 20:23     CURRENT THERAPY:Herceptin/Perjeta; tamoxifen  INTERVAL HISTORY:  Sherry Barry 52 y.o. female returns for f/u of her metastatic breast cancer prior to receiving Herceptin/Perjeta.    The patient, with a history of cancer and currently on tamoxifen, Zoladex, Herceptin, and Prolia, presents with increasing numbness and tingling in her fingertips. The symptoms are described as 'pricks' and are particularly noticeable when typing. The patient has previously been prescribed gabapentin for hot flashes, which she discontinued for reasons she cannot recall. The patient is also receiving Zometa every three months for bone health.  Aside from the neuropathic symptoms, the patient reports no new dental problems or other health concerns. She is due for a scan in January as part of her ongoing cancer surveillance.  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  MEDICAL HISTORY: Past Medical History:  Diagnosis Date   Allergy    Breast cancer (HCC)    Breast cancer (HCC)    right   History of chemotherapy     doxetaxel/carboplatin/trastuzumab   History of migraine    last one about a week ago   Hx of radiation therapy 01/01/13- 02/15/13   r chest wall, r supraclav/axilla 5040 cGy/28 sessions, scar boost 1000 cGy/5 sessions   Hypertension    no meds,   urgent care on pomana   Migraine    Migraine     SURGICAL HISTORY: Past Surgical History:  Procedure Laterality Date   ABDOMINAL HYSTERECTOMY     no salpingo-oophorectomy 2009   BREAST RECONSTRUCTION WITH PLACEMENT OF TISSUE EXPANDER AND FLEX HD (ACELLULAR HYDRATED DERMIS) Left 09/24/2013   IR IMAGING GUIDED PORT INSERTION  10/07/2021   IR THORACENTESIS ASP PLEURAL SPACE W/IMG GUIDE  08/11/2021   IR THORACENTESIS ASP PLEURAL SPACE W/IMG GUIDE  10/07/2021   LATISSIMUS FLAP TO BREAST Right 09/24/2013   Procedure: RIGHT LATISSMUS MYOCUTAEIOUS MUSCLE FLAP AND PLACEMENT OF TISSUE Lurena Nida;  Surgeon: Wayland Denis, DO;  Location: [facility];  Service: Plastics;  Laterality: Right;   LIPOSUCTION WITH LIPOFILLING Bilateral 12/12/2013   Procedure: LIPOSUCTION WITH LIPOFILLING;  Surgeon: Wayland Denis, DO;  Location: [facility];  Service: Plastics;  Laterality: Bilateral;   PORT-A-CATH REMOVAL Left 12/12/2013   Procedure: REMOVAL PORT-A-CATH;  Surgeon: Wayland Denis, DO;  Location: [facility];  Service: Plastics;  Laterality: Left;   PORTACATH PLACEMENT  07/14/2012   Procedure: INSERTION PORT-A-CATH;  Surgeon: Clovis Pu. Cornett, MD;  Location: [facility] OR;  Service: General;  Laterality: Left;   RECONSTRUCTION BREAST W/ LATISSIMUS DORSI FLAP Right 09/24/2013   "& tissue expander placement"   REMOVAL OF BILATERAL TISSUE EXPANDERS  WITH PLACEMENT OF BILATERAL BREAST IMPLANTS Bilateral 12/12/2013   Procedure: REMOVAL OF BILATERAL TISSUE EXPANDERS WITH PLACEMENT OF BILATERAL BREAST IMPLANTS/BILATERAL CAPSULECTOMIES WITH  LIPOFILLING FAT GRAFTING;  Surgeon: Wayland Denis, DO;  Location:  SURGERY CENTER;  Service: Plastics;  Laterality:  Bilateral;   SIMPLE MASTECTOMY WITH AXILLARY SENTINEL NODE BIOPSY  07/14/2012   Procedure: SIMPLE MASTECTOMY WITH AXILLARY SENTINEL NODE BIOPSY;  Surgeon: Clovis Pu. Cornett, MD;  Location: [facility];  Service: General;  Laterality: Right;  Bilateral simple mastectomy with port and right sebtibel lymph node mapping   SIMPLE MASTECTOMY WITH AXILLARY SENTINEL NODE BIOPSY  07/14/2012   Procedure: SIMPLE MASTECTOMY;  Surgeon: Clovis Pu. Cornett, MD;  Location: [facility];  Service: General;  Laterality: Left;   TISSUE EXPANDER PLACEMENT Left 09/24/2013   Procedure: PLACEMENT OF TISSUE EXPANDER AND FLEX HD TO LEFT BREAST;  Surgeon: Wayland Denis, DO;  Location: [facility];  Service: Plastics;  Laterality: Left;    SOCIAL HISTORY: Social History   Socioeconomic History   Marital status: Married    Spouse name: Not on file   Number of children: 2   Years of education: Not on file   Highest education level: Not on file  Occupational History    Employer: [employer]  Tobacco Use   Smoking status: Never   Smokeless tobacco: Never  Substance and Sexual Activity   Alcohol use: Yes    Comment: occasional   Drug use: No   Sexual activity: Yes    Birth control/protection: Surgical  Other Topics Concern   Not on file  Social History Narrative   Not on file   Social Determinants of Health   Financial Resource Strain: Not on file  Food Insecurity: Not on file  Transportation Needs: Not on file  Physical Activity: Not on file  Stress: Not on file  Social Connections: Not on file  Intimate Partner Violence: Not on file    FAMILY HISTORY: Family History  Problem Relation Age of Onset   Hypertension Mother    Alcohol abuse Mother    Heart disease Maternal Grandmother    Stroke Maternal Grandfather    Colon cancer Maternal Aunt 55       alive at 7   Brain cancer Maternal Uncle 72       and lymphoma in early 2s; deceased   Brain cancer Maternal Uncle 60       deceased   Pancreatic cancer Maternal  Uncle 45       alive at 8   Breast cancer Cousin 64       mat 1st cousin once removed through mat GF ; deceased   Breast cancer Maternal Aunt        great aunt through mat GF; dx at ? age    Review of Systems  Constitutional:  Negative for appetite change, chills, fatigue, fever and unexpected weight change.  HENT:   Negative for hearing loss, lump/mass and trouble swallowing.   Eyes:  Negative for eye problems and icterus.  Respiratory:  Negative for chest tightness, cough and shortness of breath.   Cardiovascular:  Negative for chest pain, leg swelling and palpitations.  Gastrointestinal:  Negative for abdominal distention, abdominal pain, constipation, diarrhea, nausea and vomiting.  Endocrine: Negative for hot flashes.  Genitourinary:  Negative for difficulty urinating.   Musculoskeletal:  Negative for arthralgias.  Skin:  Negative for itching and rash.  Neurological:  Negative for dizziness, extremity weakness, headaches and numbness.  Hematological:  Negative  for adenopathy. Does not bruise/bleed easily.  Psychiatric/Behavioral:  Negative for depression. The patient is not nervous/anxious.       PHYSICAL EXAMINATION    Vitals:   04/29/23 1216  BP: 125/83  Pulse: 93  Resp: 17  Temp: 98.7 F (37.1 C)  SpO2: 100%    Physical Exam Constitutional:      General: She is not in acute distress.    Appearance: Normal appearance. She is not toxic-appearing.  HENT:     Head: Normocephalic and atraumatic.  Eyes:     General: No scleral icterus. Cardiovascular:     Rate and Rhythm: Normal rate and regular rhythm.     Pulses: Normal pulses.     Heart sounds: Normal heart sounds.  Pulmonary:     Effort: Pulmonary effort is normal.     Breath sounds: Normal breath sounds.  Abdominal:     General: Abdomen is flat. Bowel sounds are normal. There is no distension.     Palpations: Abdomen is soft.     Tenderness: There is no abdominal tenderness.  Musculoskeletal:         General: No swelling.     Cervical back: Neck supple.  Lymphadenopathy:     Cervical: No cervical adenopathy.  Skin:    General: Skin is warm and dry.     Findings: No rash.  Neurological:     General: No focal deficit present.     Mental Status: She is alert.  Psychiatric:        Mood and Affect: Mood normal.        Behavior: Behavior normal.     LABORATORY DATA:  CBC    Component Value Date/Time   WBC 6.6 04/29/2023 1144   WBC 5.4 09/13/2022 1656   RBC 4.77 04/29/2023 1144   HGB 14.0 04/29/2023 1144   HGB 15.9 10/30/2019 1030   HGB 14.7 02/15/2017 0852   HCT 40.7 04/29/2023 1144   HCT 46.1 10/30/2019 1030   HCT 42.9 02/15/2017 0852   PLT 223 04/29/2023 1144   PLT 245 10/30/2019 1030   MCV 85.3 04/29/2023 1144   MCV 88 10/30/2019 1030   MCV 86.7 02/15/2017 0852   MCH 29.4 04/29/2023 1144   MCHC 34.4 04/29/2023 1144   RDW 12.3 04/29/2023 1144   RDW 12.5 10/30/2019 1030   RDW 12.6 02/15/2017 0852   LYMPHSABS 1.9 04/29/2023 1144   LYMPHSABS 1.4 02/15/2017 0852   MONOABS 0.4 04/29/2023 1144   MONOABS 0.3 02/15/2017 0852   EOSABS 0.8 (H) 04/29/2023 1144   EOSABS 0.1 02/15/2017 0852   BASOSABS 0.0 04/29/2023 1144   BASOSABS 0.0 02/15/2017 0852    CMP     Component Value Date/Time   NA 141 04/29/2023 1144   NA 140 10/30/2019 1030   NA 141 02/15/2017 0852   K 3.7 04/29/2023 1144   K 3.9 02/15/2017 0852   CL 108 04/29/2023 1144   CL 101 11/15/2012 0904   CO2 30 04/29/2023 1144   CO2 25 02/15/2017 0852   GLUCOSE 85 04/29/2023 1144   GLUCOSE 87 02/15/2017 0852   GLUCOSE 69 (L) 11/15/2012 0904   BUN 10 04/29/2023 1144   BUN 11 10/30/2019 1030   BUN 12.5 02/15/2017 0852   CREATININE 0.64 04/29/2023 1144   CREATININE 0.9 02/15/2017 0852   CALCIUM 9.2 04/29/2023 1144   CALCIUM 9.3 02/15/2017 0852   PROT 6.8 04/29/2023 1144   PROT 6.7 10/30/2019 1030   PROT 7.0 02/15/2017 0852  ALBUMIN 4.1 04/29/2023 1144   ALBUMIN 4.2 10/30/2019 1030   ALBUMIN 3.6  02/15/2017 0852   AST 15 04/29/2023 1144   AST 16 02/15/2017 0852   ALT 13 04/29/2023 1144   ALT 13 02/15/2017 0852   ALKPHOS 46 04/29/2023 1144   ALKPHOS 63 02/15/2017 0852   BILITOT 0.4 04/29/2023 1144   BILITOT 0.69 02/15/2017 0852   GFRNONAA >60 04/29/2023 1144   GFRAA 100 10/30/2019 1030       ASSESSMENT and THERAPY PLAN:   Malignant neoplasm of upper-inner quadrant of right breast in female, estrogen receptor positive (HCC) Sherry Barry is a 52 year old woman with stage IV triple positive breast cancer on maintenance herceptin/perjeta.  Metastatic Breast Cancer Stable on current regimen of Herceptin, Perjeta, and Tamoxifen. No new symptoms reported.  -Continue Herceptin and Perjeta every 21 days. -Continue Tamoxifen daily. -Continue to monitor heart every 6 months per Dr Shirlee Latch  Chemotherapy-induced peripheral neuropathy New onset of numbness and tingling in fingertips. Discussed the potential benefit of Gabapentin for neuropathic symptoms. -Start Gabapentin 300mg  at bedtime. -[facility] [place].  -Next Zoladex and Zometa due in 12 weeks. -Next Herceptin and Perjeta due in 3 weeks.  Follow-up and Surveillance Discussed extending clinic visits to every 9 weeks due to stable condition. Next imaging scheduled for January. -Next clinic visit on January 17th, 2025 to review scan results. -Order imaging for 1 week prior to next clinic visit.   All questions were answered. The patient knows to call the clinic with any problems, questions or concerns. We can certainly see the patient much sooner if necessary.  Total encounter time:30 minutes*in face-to-face visit time, chart review, lab review, care coordination, order entry, and documentation of the encounter time.  *Total Encounter Time as defined by the Centers for Medicare and Medicaid Services includes, in addition to the face-to-face time of a patient visit (documented in the note above) non-face-to-face  time: obtaining and reviewing outside history, ordering and reviewing medications, tests or procedures, care coordination (communications with other health care professionals or caregivers) and documentation in the medical record.

## 2023-04-29 NOTE — Assessment & Plan Note (Addendum)
Sherry Barry is a 52 year old woman with stage IV triple positive breast cancer on maintenance herceptin/perjeta.  Metastatic Breast Cancer Stable on current regimen of Herceptin, Perjeta, and Tamoxifen. No new symptoms reported.  -Continue Herceptin and Perjeta every 21 days. -Continue Tamoxifen daily. -Continue to monitor heart every 6 months per Dr Shirlee Latch  Chemotherapy-induced peripheral neuropathy New onset of numbness and tingling in fingertips. Discussed the potential benefit of Gabapentin for neuropathic symptoms. -Start Gabapentin 300mg  at bedtime. -[facility] [place].  -Next Zoladex and Zometa due in 12 weeks. -Next Herceptin and Perjeta due in 3 weeks.  Follow-up and Surveillance Discussed extending clinic visits to every 9 weeks due to stable condition. Next imaging scheduled for January. -Next clinic visit on January 17th, 2025 to review scan results. -Order imaging for 1 week prior to next clinic visit.

## 2023-04-29 NOTE — Patient Instructions (Signed)
[facility] - A DEPT OF [facility]  Discharge Instructions: Thank you for choosing [facility] to provide your oncology and hematology care.   If you have a lab appointment with the Cancer Center, please go directly to the Cancer Center and check in at the registration area.   Wear comfortable clothing and clothing appropriate for easy access to any Portacath or PICC line.   We strive to give you quality time with your provider. You may need to reschedule your appointment if you arrive late (15 or more minutes).  Arriving late affects you and other patients whose appointments are after yours.  Also, if you miss three or more appointments without notifying the office, you may be dismissed from the clinic at the provider's discretion.      For prescription refill requests, have your pharmacy contact our office and allow 72 hours for refills to be completed.    Today you received the following chemotherapy and/or immunotherapy agents: Trastuzumab and Pertuzumab       To help prevent nausea and vomiting after your treatment, we encourage you to take your nausea medication as directed.  BELOW ARE SYMPTOMS THAT SHOULD BE REPORTED IMMEDIATELY: *FEVER GREATER THAN 100.4 F (38 C) OR HIGHER *CHILLS OR SWEATING *NAUSEA AND VOMITING THAT IS NOT CONTROLLED WITH YOUR NAUSEA MEDICATION *UNUSUAL SHORTNESS OF BREATH *UNUSUAL BRUISING OR BLEEDING *URINARY PROBLEMS (pain or burning when urinating, or frequent urination) *BOWEL PROBLEMS (unusual diarrhea, constipation, pain near the anus) TENDERNESS IN MOUTH AND THROAT WITH OR WITHOUT PRESENCE OF ULCERS (sore throat, sores in mouth, or a toothache) UNUSUAL RASH, SWELLING OR PAIN  UNUSUAL VAGINAL DISCHARGE OR ITCHING   Items with * indicate a potential emergency and should be followed up as soon as possible or go to the Emergency Department if any problems should occur.  Please show the CHEMOTHERAPY ALERT  CARD or IMMUNOTHERAPY ALERT CARD at check-in to the Emergency Department and triage nurse.  Should you have questions after your visit or need to cancel or reschedule your appointment, please contact [facility] - A DEPT OF [facility]  Dept: [phone]  and follow the prompts.  Office hours are 8:00 a.m. to 4:30 p.m. Monday - Friday. Please note that voicemails left after 4:00 p.m. may not be returned until the following business day.  We are closed weekends and major holidays. You have access to a nurse at all times for urgent questions. Please call the main number to the clinic Dept: [phone] and follow the prompts.   For any non-urgent questions, you may also contact your provider using MyChart. We now offer e-Visits for anyone 9 and older to request care online for non-urgent symptoms. For details visit [email or web address].   Also download the MyChart app! Go to the app store, search "MyChart", open the app, select [facility], and log in with your MyChart username and password.

## 2023-05-01 ENCOUNTER — Other Ambulatory Visit: Payer: Self-pay

## 2023-05-10 DIAGNOSIS — R051 Acute cough: Secondary | ICD-10-CM | POA: Diagnosis not present

## 2023-05-10 DIAGNOSIS — J208 Acute bronchitis due to other specified organisms: Secondary | ICD-10-CM | POA: Diagnosis not present

## 2023-05-13 ENCOUNTER — Other Ambulatory Visit: Payer: Self-pay

## 2023-05-20 ENCOUNTER — Inpatient Hospital Stay: Payer: Federal, State, Local not specified - PPO | Attending: Hematology and Oncology

## 2023-05-20 VITALS — BP 128/86 | HR 85 | Temp 98.3°F | Resp 18 | Wt 140.8 lb

## 2023-05-20 DIAGNOSIS — Z5111 Encounter for antineoplastic chemotherapy: Secondary | ICD-10-CM | POA: Insufficient documentation

## 2023-05-20 DIAGNOSIS — C7951 Secondary malignant neoplasm of bone: Secondary | ICD-10-CM | POA: Diagnosis not present

## 2023-05-20 DIAGNOSIS — Z170 Estrogen receptor positive status [ER+]: Secondary | ICD-10-CM | POA: Insufficient documentation

## 2023-05-20 DIAGNOSIS — Z5112 Encounter for antineoplastic immunotherapy: Secondary | ICD-10-CM | POA: Diagnosis not present

## 2023-05-20 DIAGNOSIS — C50211 Malignant neoplasm of upper-inner quadrant of right female breast: Secondary | ICD-10-CM | POA: Diagnosis not present

## 2023-05-20 MED ORDER — ACETAMINOPHEN 325 MG PO TABS
650.0000 mg | ORAL_TABLET | Freq: Once | ORAL | Status: AC
  Administered 2023-05-20: 650 mg via ORAL
  Filled 2023-05-20: qty 2

## 2023-05-20 MED ORDER — DIPHENHYDRAMINE HCL 25 MG PO CAPS
25.0000 mg | ORAL_CAPSULE | Freq: Once | ORAL | Status: AC
  Administered 2023-05-20: 25 mg via ORAL
  Filled 2023-05-20: qty 1

## 2023-05-20 MED ORDER — SODIUM CHLORIDE 0.9 % IV SOLN
420.0000 mg | Freq: Once | INTRAVENOUS | Status: AC
  Administered 2023-05-20: 420 mg via INTRAVENOUS
  Filled 2023-05-20: qty 14

## 2023-05-20 MED ORDER — TRASTUZUMAB-DKST CHEMO 420 MG IV SOLR
6.0000 mg/kg | Freq: Once | INTRAVENOUS | Status: AC
  Administered 2023-05-20: 378 mg via INTRAVENOUS
  Filled 2023-05-20: qty 18

## 2023-05-20 MED ORDER — SODIUM CHLORIDE 0.9 % IV SOLN: Freq: Once | INTRAVENOUS | Status: AC

## 2023-05-20 MED ORDER — SODIUM CHLORIDE 0.9% FLUSH
10.0000 mL | INTRAVENOUS | Status: DC | PRN
  Administered 2023-05-20: 10 mL

## 2023-05-20 MED ORDER — HEPARIN SOD (PORK) LOCK FLUSH 100 UNIT/ML IV SOLN
500.0000 [IU] | Freq: Once | INTRAVENOUS | Status: AC | PRN
  Administered 2023-05-20: 500 [IU]

## 2023-05-20 NOTE — Patient Instructions (Signed)
[facility] - A DEPT OF [facility]  Discharge Instructions: Thank you for choosing [facility] to provide your oncology and hematology care.   If you have a lab appointment with the Cancer Center, please go directly to the Cancer Center and check in at the registration area.   Wear comfortable clothing and clothing appropriate for easy access to any Portacath or PICC line.   We strive to give you quality time with your provider. You may need to reschedule your appointment if you arrive late (15 or more minutes).  Arriving late affects you and other patients whose appointments are after yours.  Also, if you miss three or more appointments without notifying the office, you may be dismissed from the clinic at the provider's discretion.      For prescription refill requests, have your pharmacy contact our office and allow 72 hours for refills to be completed.    Today you received the following chemotherapy and/or immunotherapy agents: [place], Perjeta.       To help prevent nausea and vomiting after your treatment, we encourage you to take your nausea medication as directed.  BELOW ARE SYMPTOMS THAT SHOULD BE REPORTED IMMEDIATELY: *FEVER GREATER THAN 100.4 F (38 C) OR HIGHER *CHILLS OR SWEATING *NAUSEA AND VOMITING THAT IS NOT CONTROLLED WITH YOUR NAUSEA MEDICATION *UNUSUAL SHORTNESS OF BREATH *UNUSUAL BRUISING OR BLEEDING *URINARY PROBLEMS (pain or burning when urinating, or frequent urination) *BOWEL PROBLEMS (unusual diarrhea, constipation, pain near the anus) TENDERNESS IN MOUTH AND THROAT WITH OR WITHOUT PRESENCE OF ULCERS (sore throat, sores in mouth, or a toothache) UNUSUAL RASH, SWELLING OR PAIN  UNUSUAL VAGINAL DISCHARGE OR ITCHING   Items with * indicate a potential emergency and should be followed up as soon as possible or go to the Emergency Department if any problems should occur.  Please show the CHEMOTHERAPY ALERT CARD or  IMMUNOTHERAPY ALERT CARD at check-in to the Emergency Department and triage nurse.  Should you have questions after your visit or need to cancel or reschedule your appointment, please contact [facility] - [facility]  Dept: [phone]  and follow the prompts.  Office hours are 8:00 a.m. to 4:30 p.m. Monday - Friday. Please note that voicemails left after 4:00 p.m. may not be returned until the following business day.  We are closed weekends and major holidays. You have access to a nurse at all times for urgent questions. Please call the main number to the clinic Dept: [phone] and follow the prompts.   For any non-urgent questions, you may also contact your provider using MyChart. We now offer e-Visits for anyone 11 and older to request care online for non-urgent symptoms. For details visit [email or web address].   Also download the MyChart app! Go to the app store, search "MyChart", open the app, select [facility], and log in with your MyChart username and password.

## 2023-06-01 DIAGNOSIS — Z23 Encounter for immunization: Secondary | ICD-10-CM | POA: Diagnosis not present

## 2023-06-09 ENCOUNTER — Other Ambulatory Visit: Payer: Self-pay | Admitting: *Deleted

## 2023-06-09 DIAGNOSIS — C50211 Malignant neoplasm of upper-inner quadrant of right female breast: Secondary | ICD-10-CM

## 2023-06-09 DIAGNOSIS — T451X5A Adverse effect of antineoplastic and immunosuppressive drugs, initial encounter: Secondary | ICD-10-CM

## 2023-06-10 ENCOUNTER — Inpatient Hospital Stay: Payer: Federal, State, Local not specified - PPO

## 2023-06-10 VITALS — BP 122/81 | HR 86 | Temp 98.6°F | Resp 17 | Wt 140.0 lb

## 2023-06-10 DIAGNOSIS — Z170 Estrogen receptor positive status [ER+]: Secondary | ICD-10-CM

## 2023-06-10 DIAGNOSIS — Z5112 Encounter for antineoplastic immunotherapy: Secondary | ICD-10-CM | POA: Diagnosis not present

## 2023-06-10 DIAGNOSIS — Z5111 Encounter for antineoplastic chemotherapy: Secondary | ICD-10-CM | POA: Diagnosis not present

## 2023-06-10 DIAGNOSIS — C50211 Malignant neoplasm of upper-inner quadrant of right female breast: Secondary | ICD-10-CM | POA: Diagnosis not present

## 2023-06-10 DIAGNOSIS — T451X5A Adverse effect of antineoplastic and immunosuppressive drugs, initial encounter: Secondary | ICD-10-CM

## 2023-06-10 DIAGNOSIS — C7951 Secondary malignant neoplasm of bone: Secondary | ICD-10-CM | POA: Diagnosis not present

## 2023-06-10 LAB — CBC WITH DIFFERENTIAL (CANCER CENTER ONLY)
Abs Immature Granulocytes: 0.03 10*3/uL (ref 0.00–0.07)
Basophils Absolute: 0 10*3/uL (ref 0.0–0.1)
Basophils Relative: 1 %
Eosinophils Absolute: 0.3 10*3/uL (ref 0.0–0.5)
Eosinophils Relative: 4 %
HCT: 40.5 % (ref 36.0–46.0)
Hemoglobin: 14.7 g/dL (ref 12.0–15.0)
Immature Granulocytes: 0 %
Lymphocytes Relative: 34 %
Lymphs Abs: 2.4 10*3/uL (ref 0.7–4.0)
MCH: 30.5 pg (ref 26.0–34.0)
MCHC: 36.3 g/dL — ABNORMAL HIGH (ref 30.0–36.0)
MCV: 84 fL (ref 80.0–100.0)
Monocytes Absolute: 0.4 10*3/uL (ref 0.1–1.0)
Monocytes Relative: 6 %
Neutro Abs: 3.9 10*3/uL (ref 1.7–7.7)
Neutrophils Relative %: 55 %
Platelet Count: 241 10*3/uL (ref 150–400)
RBC: 4.82 MIL/uL (ref 3.87–5.11)
RDW: 12.6 % (ref 11.5–15.5)
WBC Count: 7.1 10*3/uL (ref 4.0–10.5)
nRBC: 0 % (ref 0.0–0.2)

## 2023-06-10 LAB — CMP (CANCER CENTER ONLY)
ALT: 12 U/L (ref 0–44)
AST: 16 U/L (ref 15–41)
Albumin: 4.3 g/dL (ref 3.5–5.0)
Alkaline Phosphatase: 48 U/L (ref 38–126)
Anion gap: 7 (ref 5–15)
BUN: 12 mg/dL (ref 6–20)
CO2: 27 mmol/L (ref 22–32)
Calcium: 9.3 mg/dL (ref 8.9–10.3)
Chloride: 106 mmol/L (ref 98–111)
Creatinine: 0.69 mg/dL (ref 0.44–1.00)
GFR, Estimated: >60 mL/min (ref >60)
Glucose, Bld: 88 mg/dL (ref 70–99)
Potassium: 3.6 mmol/L (ref 3.5–5.1)
Sodium: 140 mmol/L (ref 135–145)
Total Bilirubin: 0.4 mg/dL (ref <1.2)
Total Protein: 7.2 g/dL (ref 6.5–8.1)

## 2023-06-10 MED ORDER — ACETAMINOPHEN 325 MG PO TABS
650.0000 mg | ORAL_TABLET | Freq: Once | ORAL | Status: AC
  Administered 2023-06-10: 650 mg via ORAL
  Filled 2023-06-10: qty 2

## 2023-06-10 MED ORDER — TRASTUZUMAB-DKST CHEMO 150 MG IV SOLR
6.0000 mg/kg | Freq: Once | INTRAVENOUS | Status: AC
  Administered 2023-06-10: 378 mg via INTRAVENOUS
  Filled 2023-06-10: qty 3

## 2023-06-10 MED ORDER — HEPARIN SOD (PORK) LOCK FLUSH 100 UNIT/ML IV SOLN
500.0000 [IU] | Freq: Once | INTRAVENOUS | Status: AC | PRN
  Administered 2023-06-10: 500 [IU]

## 2023-06-10 MED ORDER — SODIUM CHLORIDE 0.9 % IV SOLN: Freq: Once | INTRAVENOUS | Status: AC

## 2023-06-10 MED ORDER — DIPHENHYDRAMINE HCL 25 MG PO CAPS
25.0000 mg | ORAL_CAPSULE | Freq: Once | ORAL | Status: AC
  Administered 2023-06-10: 25 mg via ORAL
  Filled 2023-06-10: qty 1

## 2023-06-10 MED ORDER — SODIUM CHLORIDE 0.9 % IV SOLN
420.0000 mg | Freq: Once | INTRAVENOUS | Status: AC
  Administered 2023-06-10: 420 mg via INTRAVENOUS
  Filled 2023-06-10: qty 14

## 2023-06-10 MED ORDER — SODIUM CHLORIDE 0.9% FLUSH
10.0000 mL | Freq: Once | INTRAVENOUS | Status: AC
  Administered 2023-06-10: 10 mL

## 2023-06-10 MED ORDER — SODIUM CHLORIDE 0.9% FLUSH
10.0000 mL | INTRAVENOUS | Status: DC | PRN
  Administered 2023-06-10: 10 mL

## 2023-06-10 NOTE — Patient Instructions (Signed)
 [facility] - A DEPT OF [facility]  Discharge Instructions: Thank you for choosing [facility] to provide your oncology and hematology care.   If you have a lab appointment with the Cancer Center, please go directly to the Cancer Center and check in at the registration area.   Wear comfortable clothing and clothing appropriate for easy access to any Portacath or PICC line.   We strive to give you quality time with your provider. You may need to reschedule your appointment if you arrive late (15 or more minutes).  Arriving late affects you and other patients whose appointments are after yours.  Also, if you miss three or more appointments without notifying the office, you may be dismissed from the clinic at the provider's discretion.      For prescription refill requests, have your pharmacy contact our office and allow 72 hours for refills to be completed.    Today you received the following chemotherapy and/or immunotherapy agents: Trastuzumab and Pertuzumab      To help prevent nausea and vomiting after your treatment, we encourage you to take your nausea medication as directed.  BELOW ARE SYMPTOMS THAT SHOULD BE REPORTED IMMEDIATELY: *FEVER GREATER THAN 100.4 F (38 C) OR HIGHER *CHILLS OR SWEATING *NAUSEA AND VOMITING THAT IS NOT CONTROLLED WITH YOUR NAUSEA MEDICATION *UNUSUAL SHORTNESS OF BREATH *UNUSUAL BRUISING OR BLEEDING *URINARY PROBLEMS (pain or burning when urinating, or frequent urination) *BOWEL PROBLEMS (unusual diarrhea, constipation, pain near the anus) TENDERNESS IN MOUTH AND THROAT WITH OR WITHOUT PRESENCE OF ULCERS (sore throat, sores in mouth, or a toothache) UNUSUAL RASH, SWELLING OR PAIN  UNUSUAL VAGINAL DISCHARGE OR ITCHING   Items with * indicate a potential emergency and should be followed up as soon as possible or go to the Emergency Department if any problems should occur.  Please show the CHEMOTHERAPY ALERT CARD  or IMMUNOTHERAPY ALERT CARD at check-in to the Emergency Department and triage nurse.  Should you have questions after your visit or need to cancel or reschedule your appointment, please contact [facility] - A DEPT OF [facility]  Dept: [phone]  and follow the prompts.  Office hours are 8:00 a.m. to 4:30 p.m. Monday - Friday. Please note that voicemails left after 4:00 p.m. may not be returned until the following business day.  We are closed weekends and major holidays. You have access to a nurse at all times for urgent questions. Please call the main number to the clinic Dept: [phone] and follow the prompts.   For any non-urgent questions, you may also contact your provider using MyChart. We now offer e-Visits for anyone 67 and older to request care online for non-urgent symptoms. For details visit [email or web address].   Also download the MyChart app! Go to the app store, search "MyChart", open the app, select [place], and log in with your MyChart username and password.

## 2023-06-18 ENCOUNTER — Encounter: Payer: Self-pay | Admitting: Hematology and Oncology

## 2023-06-22 ENCOUNTER — Encounter: Payer: Self-pay | Admitting: Hematology and Oncology

## 2023-06-23 ENCOUNTER — Ambulatory Visit (HOSPITAL_COMMUNITY)
Admission: RE | Admit: 2023-06-23 | Discharge: 2023-06-23 | Disposition: A | Discharge status: Home / Self Care | Payer: Federal, State, Local not specified - PPO | Source: Ambulatory Visit | Attending: Hematology and Oncology | Admitting: Hematology and Oncology

## 2023-06-23 DIAGNOSIS — C801 Malignant (primary) neoplasm, unspecified: Secondary | ICD-10-CM | POA: Diagnosis not present

## 2023-06-23 DIAGNOSIS — J90 Pleural effusion, not elsewhere classified: Secondary | ICD-10-CM | POA: Diagnosis not present

## 2023-06-23 DIAGNOSIS — C50211 Malignant neoplasm of upper-inner quadrant of right female breast: Secondary | ICD-10-CM | POA: Diagnosis not present

## 2023-06-23 DIAGNOSIS — Z170 Estrogen receptor positive status [ER+]: Secondary | ICD-10-CM | POA: Insufficient documentation

## 2023-06-23 DIAGNOSIS — K5730 Diverticulosis of large intestine without perforation or abscess without bleeding: Secondary | ICD-10-CM | POA: Diagnosis not present

## 2023-06-23 DIAGNOSIS — C7951 Secondary malignant neoplasm of bone: Secondary | ICD-10-CM | POA: Diagnosis not present

## 2023-06-23 MED ORDER — IOHEXOL 300 MG/ML  SOLN
100.0000 mL | Freq: Once | INTRAMUSCULAR | Status: AC | PRN
  Administered 2023-06-23: 100 mL via INTRAVENOUS

## 2023-07-01 ENCOUNTER — Encounter: Payer: Self-pay | Admitting: Hematology and Oncology

## 2023-07-01 ENCOUNTER — Inpatient Hospital Stay: Payer: Federal, State, Local not specified - PPO

## 2023-07-01 ENCOUNTER — Inpatient Hospital Stay
Payer: Federal, State, Local not specified - PPO | Attending: Hematology and Oncology | Admitting: Hematology and Oncology

## 2023-07-01 VITALS — BP 125/91 | HR 96 | Temp 98.2°F | Resp 16 | Wt 140.7 lb

## 2023-07-01 DIAGNOSIS — Z170 Estrogen receptor positive status [ER+]: Secondary | ICD-10-CM | POA: Insufficient documentation

## 2023-07-01 DIAGNOSIS — Z9221 Personal history of antineoplastic chemotherapy: Secondary | ICD-10-CM | POA: Diagnosis not present

## 2023-07-01 DIAGNOSIS — C50211 Malignant neoplasm of upper-inner quadrant of right female breast: Secondary | ICD-10-CM | POA: Diagnosis not present

## 2023-07-01 DIAGNOSIS — Z923 Personal history of irradiation: Secondary | ICD-10-CM | POA: Insufficient documentation

## 2023-07-01 DIAGNOSIS — Z79810 Long term (current) use of selective estrogen receptor modulators (SERMs): Secondary | ICD-10-CM | POA: Diagnosis not present

## 2023-07-01 DIAGNOSIS — C7951 Secondary malignant neoplasm of bone: Secondary | ICD-10-CM | POA: Insufficient documentation

## 2023-07-01 DIAGNOSIS — Z9013 Acquired absence of bilateral breasts and nipples: Secondary | ICD-10-CM | POA: Insufficient documentation

## 2023-07-01 NOTE — Progress Notes (Signed)
[facility] Cancer Follow up:    Sherry Bur, MD [address] [place] [address]   DIAGNOSIS:  Cancer Staging  Malignant neoplasm of upper-inner quadrant of right breast in female, estrogen receptor positive (HCC) Staging form: Breast, AJCC 7th Edition - Pathologic stage from 07/14/2012: Stage IV (TX, NX, M1) - Signed by Rachel Moulds, MD on 08/28/2021 Specimen type: Core Needle Biopsy Histopathologic type: 9931 Laterality: Right   SUMMARY OF ONCOLOGIC HISTORY: Oncology History  Malignant neoplasm of upper-inner quadrant of right breast in female, estrogen receptor positive (HCC)  07/13/2012 Clinical Stage   Stage IA: T1c N0   07/14/2012 Definitive Surgery   Bilateral mastectomy/right SLNB: RIGHT invasive ductal carcinoma, grade 3, ER+, PR +, Her2/neu positive (ratio 3.02), Ki67 48%. DCIS. 1/4 LN positive for malignancy. LEFT: benign   07/14/2012 Pathologic Stage   Stage IIB: T2 N1a M0   07/14/2012 Cancer Staging   Staging form: Breast, AJCC 7th Edition - Pathologic stage from 07/14/2012: Stage IV (TX, NX, M1) - Signed by Rachel Moulds, MD on 08/28/2021 Specimen type: Core Needle Biopsy Histopathologic type: 9931 Laterality: Right   08/17/2012 - 08/30/2013 Chemotherapy   Adjuvant carboplatin, docetaxel, and trastuzumab x 6 cycles (completed 11/30/2012) followed by maintenance trastuzumab to total one year   11/2012 Procedure   Comp Cancer Gene panel (GeneDx) negative for deleterious mutations    - 02/2013 Radiation Therapy   Adjuvant RT to right breast   02/2013 -  Anti-estrogen oral therapy   Tamoxifen 20 mg daily   09/24/2013 Surgery   Bilateral breast reconstruction with latissmus flap and expander placement   12/12/2013 Surgery   Implant placement    Relapse/Recurrence   Left sided malignant pleural effusion.  Tumor cells are positive for GATA3 and ER, negative for TTF-1 consistent with recurrent breast carcinoma Prognostic showed ER 90%  positive strong staining, PR 80% positive strong staining, HER2 negative.    09/16/2021 Imaging   PET scan showed malignant left pleural effusion with extensive areas of nodularity and hypermetabolic activity involving the mediastinal border and pericardium as well as the most inferior aspects of the costodiaphragmatic recess.  Signs of nodal disease at the thoracic inlet on the left suspect extension of disease below the diaphragm along the left anterolateral aorta.  Nonspecific moderate to markedly increased metabolic activity about the base of the tongue bilaterally.  Consider direct visualization showing mildly asymmetric uptake favoring the right lingual tonsil.  Sclerotic foci without marked increased metabolic activity suspicious for metastatic disease perhaps treated or questions.  Heterogeneous marrow uptake seen elsewhere generalized and nonspecific.  Consider spinal MRI is warranted  MRI brain without any evidence of intracranial metastatic disease.   10/09/2021 - 02/11/2022 Chemotherapy   Taxotere, Herceptin, Perjeta x 6   12/04/2021 Imaging   There is no evidence new metastatic disease. There is interval decrease in the left pleural effusion possibly suggesting resolving pleural metastatic disease. Few scattered sclerotic metastatic lesions in the skeletal structures have not changed significantly.   No acute findings are seen in the chest abdomen and pelvis.     02/22/2022 -  Antibody Plan   Herceptin/Perjeta every 3 weeks   03/21/2022 Imaging   CT chest abdomen pelvis  IMPRESSION: 1. No significant change since previous study. Again demonstrated is a chronic small left pleural effusion with basilar atelectasis and scattered sclerotic skeletal metastases. 2. No acute abnormalities are demonstrated. 3. Small esophageal hiatal hernia.     Electronically Signed   By:  Burman Nieves M.D.   On: 03/21/2022 20:23     CURRENT THERAPY:Herceptin/Perjeta; tamoxifen  INTERVAL  HISTORY:  Sherry Barry 53 y.o. female returns for f/u of her metastatic breast cancer prior to receiving Herceptin/Perjeta.    The patient, with a history of met breast cancer, presents for a follow-up visit after recent CT scan results showed changes indicating a need for a change in treatment. The patient has not noticed any significant changes in her physical condition, although a family member notes an increase in the patient's coughing. The patient is currently on treatment, the effectiveness of which is now in question due to the recent scan results. The patient has not experienced any new or worsening symptoms and has not reported any side effects from the current treatment.  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  MEDICAL HISTORY: Past Medical History:  Diagnosis Date   Allergy    Breast cancer (HCC)    Breast cancer (HCC)    right   History of chemotherapy    doxetaxel/carboplatin/trastuzumab   History of migraine    last one about a week ago   Hx of radiation therapy 01/01/13- 02/15/13   r chest wall, r supraclav/axilla 5040 cGy/28 sessions, scar boost 1000 cGy/5 sessions   Hypertension    no meds,   urgent care on pomana   Migraine     Migraine     SURGICAL HISTORY: Past Surgical History:  Procedure Laterality Date   ABDOMINAL HYSTERECTOMY     no salpingo-oophorectomy 2009   BREAST RECONSTRUCTION WITH PLACEMENT OF TISSUE EXPANDER AND FLEX HD (ACELLULAR HYDRATED DERMIS) Left 09/24/2013   IR IMAGING GUIDED PORT INSERTION  10/07/2021   IR THORACENTESIS ASP PLEURAL SPACE W/IMG GUIDE  08/11/2021   IR THORACENTESIS ASP PLEURAL SPACE W/IMG GUIDE  10/07/2021   LATISSIMUS FLAP TO BREAST Right 09/24/2013   Procedure: RIGHT LATISSMUS MYOCUTAEIOUS MUSCLE FLAP AND PLACEMENT OF TISSUE Lurena Nida;  Surgeon: Wayland Denis, DO;  Location: [facility];  Service: Plastics;  Laterality: Right;   LIPOSUCTION WITH LIPOFILLING Bilateral 12/12/2013   Procedure: LIPOSUCTION WITH LIPOFILLING;  Surgeon: Wayland Denis, DO;  Location: [facility];  Service: Plastics;  Laterality: Bilateral;   PORT-A-CATH REMOVAL Left 12/12/2013   Procedure: REMOVAL PORT-A-CATH;  Surgeon: Wayland Denis, DO;  Location: [facility];  Service: Plastics;  Laterality: Left;   PORTACATH PLACEMENT  07/14/2012   Procedure: INSERTION PORT-A-CATH;  Surgeon: Clovis Pu. Cornett, MD;  Location: [facility];  Service: General;  Laterality: Left;   RECONSTRUCTION BREAST W/ LATISSIMUS DORSI FLAP Right 09/24/2013   "& tissue expander placement"   REMOVAL OF BILATERAL TISSUE EXPANDERS WITH PLACEMENT OF BILATERAL BREAST IMPLANTS Bilateral 12/12/2013   Procedure: REMOVAL OF BILATERAL TISSUE EXPANDERS WITH PLACEMENT OF BILATERAL BREAST IMPLANTS/BILATERAL CAPSULECTOMIES WITH  LIPOFILLING FAT GRAFTING;  Surgeon: Alan Ripper  Sanger, DO;  Location: [facility];  Service: Government social research officer;  Laterality: Bilateral;   SIMPLE MASTECTOMY WITH AXILLARY SENTINEL NODE BIOPSY  07/14/2012   Procedure: SIMPLE MASTECTOMY WITH AXILLARY SENTINEL NODE BIOPSY;  Surgeon: Clovis Pu. Cornett, MD;  Location: [facility];  Service: General;  Laterality: Right;  Bilateral simple mastectomy with port and right sebtibel  lymph node mapping   SIMPLE MASTECTOMY WITH AXILLARY SENTINEL NODE BIOPSY  07/14/2012   Procedure: SIMPLE MASTECTOMY;  Surgeon: Clovis Pu. Cornett, MD;  Location: [facility];  Service: General;  Laterality: Left;   TISSUE EXPANDER PLACEMENT Left 09/24/2013   Procedure: PLACEMENT OF TISSUE EXPANDER AND FLEX HD TO LEFT BREAST;  Surgeon: Wayland Denis, DO;  Location: [facility];  Service: Plastics;  Laterality: Left;    SOCIAL HISTORY: Social History   Socioeconomic History   Marital status: Married    Spouse name: Not on file   Number of children: 2   Years of education: Not on file   Highest education level: Not on file  Occupational History    Employer: [employer]  Tobacco Use   Smoking status: Never   Smokeless tobacco: Never  Substance and Sexual Activity   Alcohol use: Yes    Comment: occasional   Drug use: No   Sexual activity: Yes    Birth control/protection: Surgical  Other Topics Concern   Not on file  Social History Narrative   Not on file   Social Drivers of Health   Financial Resource Strain: Not on file  Food Insecurity: Not on file  Transportation Needs: Not on file  Physical Activity: Not on file  Stress: Not on file  Social Connections: Not on file  Intimate Partner Violence: Not on file    FAMILY HISTORY: Family History  Problem Relation Age of Onset   Hypertension Mother    Alcohol abuse Mother    Heart disease Maternal Grandmother    Stroke Maternal Grandfather    Colon cancer Maternal Aunt 55       alive at 60   Brain cancer Maternal Uncle 66       and lymphoma in early 20s; deceased   Brain cancer Maternal Uncle 60       deceased   Pancreatic cancer Maternal Uncle 45       alive at 82   Breast cancer Cousin 68       mat 1st cousin once removed through mat GF ; deceased   Breast cancer Maternal Aunt        great aunt through mat GF; dx at ? age    Review of Systems  Constitutional:  Negative for appetite change, chills, fatigue, fever and  unexpected weight change.  HENT:   Negative for hearing loss, lump/mass and trouble swallowing.   Eyes:  Negative for eye problems and icterus.  Respiratory:  Negative for chest tightness, cough and shortness of breath.   Cardiovascular:  Negative for chest pain, leg swelling and palpitations.  Gastrointestinal:  Negative for abdominal distention, abdominal pain, constipation, diarrhea, nausea and vomiting.  Endocrine: Negative for hot flashes.  Genitourinary:  Negative for difficulty urinating.   Musculoskeletal:  Negative for arthralgias.  Skin:  Negative for itching and rash.  Neurological:  Negative for dizziness, extremity weakness, headaches and numbness.  Hematological:  Negative for adenopathy. Does not bruise/bleed easily.  Psychiatric/Behavioral:  Negative for depression. The patient is not nervous/anxious.       PHYSICAL EXAMINATION    Vitals:  07/01/23 1225  BP: (!) 125/91  Pulse: 96  Resp: 16  Temp: 98.2 F (36.8 C)  SpO2: 99%   She appears well. No acute distress.  LABORATORY DATA:  CBC    Component Value Date/Time   WBC 7.1 06/10/2023 1158   WBC 5.4 09/13/2022 1656   RBC 4.82 06/10/2023 1158   HGB 14.7 06/10/2023 1158   HGB 15.9 10/30/2019 1030   HGB 14.7 02/15/2017 0852   HCT 40.5 06/10/2023 1158   HCT 46.1 10/30/2019 1030   HCT 42.9 02/15/2017 0852   PLT 241 06/10/2023 1158   PLT 245 10/30/2019 1030   MCV 84.0 06/10/2023 1158   MCV 88 10/30/2019 1030   MCV 86.7 02/15/2017 0852   MCH 30.5 06/10/2023 1158   MCHC 36.3 (H) 06/10/2023 1158   RDW 12.6 06/10/2023 1158   RDW 12.5 10/30/2019 1030   RDW 12.6 02/15/2017 0852   LYMPHSABS 2.4 06/10/2023 1158   LYMPHSABS 1.4 02/15/2017 0852   MONOABS 0.4 06/10/2023 1158   MONOABS 0.3 02/15/2017 0852   EOSABS 0.3 06/10/2023 1158   EOSABS 0.1 02/15/2017 0852   BASOSABS 0.0 06/10/2023 1158   BASOSABS 0.0 02/15/2017 0852    CMP     Component Value Date/Time   NA 140 06/10/2023 1158   NA 140  10/30/2019 1030   NA 141 02/15/2017 0852   K 3.6 06/10/2023 1158   K 3.9 02/15/2017 0852   CL 106 06/10/2023 1158   CL 101 11/15/2012 0904   CO2 27 06/10/2023 1158   CO2 25 02/15/2017 0852   GLUCOSE 88 06/10/2023 1158   GLUCOSE 87 02/15/2017 0852   GLUCOSE 69 (L) 11/15/2012 0904   BUN 12 06/10/2023 1158   BUN 11 10/30/2019 1030   BUN 12.5 02/15/2017 0852   CREATININE 0.69 06/10/2023 1158   CREATININE 0.9 02/15/2017 0852   CALCIUM 9.3 06/10/2023 1158   CALCIUM 9.3 02/15/2017 0852   PROT 7.2 06/10/2023 1158   PROT 6.7 10/30/2019 1030   PROT 7.0 02/15/2017 0852   ALBUMIN 4.3 06/10/2023 1158   ALBUMIN 4.2 10/30/2019 1030   ALBUMIN 3.6 02/15/2017 0852   AST 16 06/10/2023 1158   AST 16 02/15/2017 0852   ALT 12 06/10/2023 1158   ALT 13 02/15/2017 0852   ALKPHOS 48 06/10/2023 1158   ALKPHOS 63 02/15/2017 0852   BILITOT 0.4 06/10/2023 1158   BILITOT 0.69 02/15/2017 0852   GFRNONAA >60 06/10/2023 1158   GFRAA 100 10/30/2019 1030       ASSESSMENT and THERAPY PLAN:   Malignant neoplasm of upper-inner quadrant of right breast in female, estrogen receptor positive (HCC) Sherry Barry is a 53 year old woman with stage IV triple positive breast cancer on maintenance herceptin/perjeta.  Metastatic HER2 positive breast cancer Progression of disease on current therapy. Discussed options of switching to Enhertu or Kadcyla. Enhertu has potential for more side effects including fatigue and rare pneumonitis, but is more effective and SOC for second line. Kadcyla is more tolerable with less fatigue and no risk of pneumonitis, but is considered third line. Patient to decide on treatment choice. -If Enhertu chosen, start at FDA label dose of 5.4mg /kg with potential for dose reductions to 4.4mg /kg or 3.2mg /kg based on tolerance. -Order brain MRI to rule out metastasis, as this would influence treatment choice. -Contact office on 07/04/2023 with decision.  General Health Maintenance -Provide note for  patient to work from home. -Provide temporary handicap placard.   All questions were answered. The patient knows to call  the clinic with any problems, questions or concerns. We can certainly see the patient much sooner if necessary.  Total encounter time:30 minutes*in face-to-face visit time, chart review, lab review, care coordination, order entry, and documentation of the encounter time.  *Total Encounter Time as defined by the Centers for Medicare and Medicaid Services includes, in addition to the face-to-face time of a patient visit (documented in the note above) non-face-to-face time: obtaining and reviewing outside history, ordering and reviewing medications, tests or procedures, care coordination (communications with other health care professionals or caregivers) and documentation in the medical record.

## 2023-07-01 NOTE — Assessment & Plan Note (Signed)
Sherry Barry is a 53 year old woman with stage IV triple positive breast cancer on maintenance herceptin/perjeta.  Metastatic HER2 positive breast cancer Progression of disease on current therapy. Discussed options of switching to Enhertu or Kadcyla. Enhertu has potential for more side effects including fatigue and rare pneumonitis, but is more effective and SOC for second line. Kadcyla is more tolerable with less fatigue and no risk of pneumonitis, but is considered third line. Patient to decide on treatment choice. -If Enhertu chosen, start at FDA label dose of 5.4mg /kg with potential for dose reductions to 4.4mg /kg or 3.2mg /kg based on tolerance. -Order brain MRI to rule out metastasis, as this would influence treatment choice. -Contact office on 07/04/2023 with decision.  General Health Maintenance -Provide note for patient to work from home. -Provide temporary handicap placard.

## 2023-07-05 ENCOUNTER — Telehealth: Payer: Self-pay | Admitting: *Deleted

## 2023-07-05 NOTE — Telephone Encounter (Signed)
Pt left VM on RN line wanting Dr Al Pimple to know she would proceed with Enhertu - would like to keep on Thur or Fri due to her husband's work schedule.  She also wants to verify if she is to continue the tamoxifen.  This message will be forwarded to MD.

## 2023-07-06 ENCOUNTER — Other Ambulatory Visit: Payer: Self-pay | Admitting: Hematology and Oncology

## 2023-07-06 ENCOUNTER — Encounter: Payer: Self-pay | Admitting: Hematology and Oncology

## 2023-07-06 DIAGNOSIS — Z170 Estrogen receptor positive status [ER+]: Secondary | ICD-10-CM

## 2023-07-06 NOTE — Progress Notes (Signed)
DISCONTINUE ON PATHWAY REGIMEN - Breast     Cycle 1: A cycle is 21 days:     Pertuzumab      Trastuzumab-xxxx      Docetaxel    Cycles 2 and beyond: A cycle is every 21 days:     Pertuzumab      Trastuzumab-xxxx      Docetaxel   **Always confirm dose/schedule in your pharmacy ordering system**  PRIOR TREATMENT: [id number]: Docetaxel + Trastuzumab IV + Pertuzumab IV (THP IV) q21 Days x 6-8 Cycles, Followed by Trastuzumab IV + Pertuzumab IV q21 Days  START ON PATHWAY REGIMEN - Breast     A cycle is every 21 days:     Fam-trastuzumab [id number]-nxki   **Always confirm dose/schedule in your pharmacy ordering system**  Patient Characteristics: Distant Metastases or Locoregional Recurrent Disease - Unresected, M0 or Locally Advanced Unresectable Disease Progressing after Neoadjuvant and Local Therapies, M0, HER2 Positive, ER Positive, Chemotherapy + HER2-Targeted Therapy, Second Line Therapeutic Status: Distant Metastases HER2 Status: Positive (+) ER Status: Positive (+) PR Status: Positive (+) Line of Therapy: Second Line Intent of Therapy: Non-Curative / Palliative Intent, Discussed with Patient

## 2023-07-06 NOTE — Progress Notes (Signed)
Treatment plan changed to Enhertu.

## 2023-07-07 ENCOUNTER — Ambulatory Visit (HOSPITAL_COMMUNITY)
Admission: RE | Admit: 2023-07-07 | Discharge: 2023-07-07 | Disposition: A | Discharge status: Home / Self Care | Payer: Federal, State, Local not specified - PPO | Source: Ambulatory Visit | Attending: Hematology and Oncology | Admitting: Hematology and Oncology

## 2023-07-07 ENCOUNTER — Encounter: Payer: Self-pay | Admitting: Hematology and Oncology

## 2023-07-07 DIAGNOSIS — C50211 Malignant neoplasm of upper-inner quadrant of right female breast: Secondary | ICD-10-CM | POA: Diagnosis not present

## 2023-07-07 DIAGNOSIS — Z170 Estrogen receptor positive status [ER+]: Secondary | ICD-10-CM | POA: Diagnosis not present

## 2023-07-07 DIAGNOSIS — R930 Abnormal findings on diagnostic imaging of skull and head, not elsewhere classified: Secondary | ICD-10-CM | POA: Diagnosis not present

## 2023-07-07 MED ORDER — GADOBUTROL 1 MMOL/ML IV SOLN
6.0000 mL | Freq: Once | INTRAVENOUS | Status: AC | PRN
  Administered 2023-07-07: 6 mL via INTRAVENOUS

## 2023-07-11 ENCOUNTER — Other Ambulatory Visit: Payer: Self-pay | Admitting: *Deleted

## 2023-07-12 ENCOUNTER — Encounter (HOSPITAL_COMMUNITY): Payer: Self-pay | Admitting: Cardiology

## 2023-07-12 ENCOUNTER — Ambulatory Visit (HOSPITAL_BASED_OUTPATIENT_CLINIC_OR_DEPARTMENT_OTHER)
Admission: RE | Admit: 2023-07-12 | Discharge: 2023-07-12 | Disposition: A | Discharge status: Home / Self Care | Payer: Federal, State, Local not specified - PPO | Source: Ambulatory Visit | Attending: Cardiology | Admitting: Cardiology

## 2023-07-12 ENCOUNTER — Ambulatory Visit (HOSPITAL_COMMUNITY)
Admission: RE | Admit: 2023-07-12 | Discharge: 2023-07-12 | Disposition: A | Discharge status: Home / Self Care | Payer: Federal, State, Local not specified - PPO | Source: Ambulatory Visit | Attending: Family Medicine | Admitting: Family Medicine

## 2023-07-12 ENCOUNTER — Telehealth: Payer: Self-pay

## 2023-07-12 VITALS — BP 122/78 | HR 80 | Wt 139.2 lb

## 2023-07-12 DIAGNOSIS — Z853 Personal history of malignant neoplasm of breast: Secondary | ICD-10-CM

## 2023-07-12 DIAGNOSIS — Z79899 Other long term (current) drug therapy: Secondary | ICD-10-CM | POA: Insufficient documentation

## 2023-07-12 DIAGNOSIS — I110 Hypertensive heart disease with heart failure: Secondary | ICD-10-CM | POA: Insufficient documentation

## 2023-07-12 DIAGNOSIS — Z0189 Encounter for other specified special examinations: Secondary | ICD-10-CM | POA: Diagnosis not present

## 2023-07-12 DIAGNOSIS — Z170 Estrogen receptor positive status [ER+]: Secondary | ICD-10-CM | POA: Diagnosis not present

## 2023-07-12 DIAGNOSIS — C50211 Malignant neoplasm of upper-inner quadrant of right female breast: Secondary | ICD-10-CM | POA: Diagnosis not present

## 2023-07-12 DIAGNOSIS — I509 Heart failure, unspecified: Secondary | ICD-10-CM | POA: Diagnosis not present

## 2023-07-12 LAB — ECHOCARDIOGRAM COMPLETE
AR max vel: 2.47 cm2
AV Area VTI: 2.31 cm2
AV Area mean vel: 2.39 cm2
AV Mean grad: 3 mm[Hg]
AV Peak grad: 6.1 mm[Hg]
Ao pk vel: 1.23 m/s
Area-P 1/2: 4.06 cm2
MV VTI: 2.18 cm2
S' Lateral: 2.4 cm

## 2023-07-12 NOTE — Progress Notes (Signed)
  Echocardiogram 2D Echocardiogram has been performed.  Sherry Barry [place] 07/12/2023, 1:29 PM

## 2023-07-12 NOTE — Patient Instructions (Signed)
No changes.  Your physician has requested that you have an echocardiogram. Echocardiography is a painless test that uses sound waves to create images of your heart. It provides your doctor with information about the size and shape of your heart and how well your heart's chambers and valves are working. This procedure takes approximately one hour. There are no restrictions for this procedure. Please do NOT wear cologne, perfume, aftershave, or lotions (deodorant is allowed). Please arrive 15 minutes prior to your appointment time.  Please note: We ask at that you not bring children with you during ultrasound (echo/ vascular) testing. Due to room size and safety concerns, children are not allowed in the ultrasound rooms during exams. Our front office staff cannot provide observation of children in our lobby area while testing is being conducted. An adult accompanying a patient to their appointment will only be allowed in the ultrasound room at the discretion of the ultrasound technician under special circumstances. We apologize for any inconvenience.  Your physician recommends that you schedule a follow-up appointment in: 3 months with an echocardiogram ( April) ** PLEASE CALL THE OFFICE IN MID FEBRUARY/ MARCH TO ARRANGE YOUR FOLLOW UP APPOINTMENT.**  If you have any questions or concerns before your next appointment please send [place] a message through Gray or call our office at [phone].    TO LEAVE A MESSAGE FOR THE NURSE SELECT OPTION 2, PLEASE LEAVE A MESSAGE INCLUDING: YOUR NAME DATE OF BIRTH CALL BACK NUMBER REASON FOR CALL**this is important as we prioritize the call backs  YOU WILL RECEIVE A CALL BACK THE SAME DAY AS LONG AS YOU CALL BEFORE 4:00 PM  At the [facility], you and your health needs are our priority. As part of our continuing mission to provide you with exceptional heart care, we have created designated Provider Care Teams. These Care Teams include your  primary Cardiologist (physician) and Advanced Practice Providers (APPs- Physician Assistants and Nurse Practitioners) who all work together to provide you with the care you need, when you need it.   You may see any of the following providers on your designated Care Team at your next follow up: Dr Arvilla Meres Dr Marca Ancona Dr. Dorthula Nettles Dr. Clearnce Hasten Amy Filbert Schilder, NP Robbie Lis, [place] [facility] [place], [place] Brynda Peon, NP [place] Lee, NP Karle Plumber, PharmD   Please be sure to bring in all your medications bottles to every appointment.    Thank you for choosing [facility]

## 2023-07-12 NOTE — Telephone Encounter (Signed)
Notified Patient of completion of Reasonable Accommodation Form. Fax transmission confirmation received. Copy of form emailed to Patient as requested. No other needs or concerns voiced at this time.

## 2023-07-13 ENCOUNTER — Telehealth: Payer: Self-pay | Admitting: Hematology and Oncology

## 2023-07-13 NOTE — Telephone Encounter (Signed)
Spoke with patient confirming upcoming appointment

## 2023-07-13 NOTE — Progress Notes (Signed)
Oncology: Dr. Al Pimple  Chest complaint: Fatigue  53 y.o. with history of stage IV triple positive breast cancer presents for cardio-oncology evaluation.  Initial diagnosis in 1/14.  Bilateral mastectomy, ER+/PR+/HER2+.  She was treated with carboplatin/docetaxel/trastuzumab x 6 cycles then trastuzumab alone to complete 1 year.  She had radiation as well. In 4/23, patient was found to have recurrence with left malignant pleural effusion. She has completed chemo with docetaxel/trastuzumab/pertuzumab and will continue trastuzumab.    Echo in 10/23 showed. EF 60-65% with GLS -20.4% (stable). Echo in 1/24 showed EF 60-65%, RV normal, GLS less negative at -15.5% but poor tracking of the endocardium. Echo was done today and reviewed, EF 55-60%, GLS -18.4%, normal diastolic function, normal RV.    Echo in 7/24 showed EF 55-60%, normal diastolic function, GLS -20.4%, normal RV.   Echo was done today and I reviewed, showing EF 55-60%, GLS -19%, RV normal, PASP 23 mmHg.   Patient did not have cardiac complications from her initial chemotherapy involving trastuzumab in 2014.  She does have treated HTN.  No family history of cardiomyopathy or early CAD.  Nonsmoker.  She has been on Herceptin/Perjeta, but will be starting on Enhertu. She has been doing well generally, walks 2 miles/day with her dog without dyspnea.  No chest pain.   ECG (personally reviewed): NSR, normal  Labs (5/23): K 3.3, creatinine 0.64 Labs (1/24): EF 3.3, creatinine 0.66, hgb 14.6 Labs (4/24): K 2.9, creatinine 0.51 Labs (12/24): K 3.6, creatinine 0.69  PMH: 1. HTN 2. Migraines 3. Breast cancer: Initial diagnosis in 1/14.  Bilateral mastectomy, ER+/PR+/HER2+.  She was treated with carboplatin/docetaxel/trastuzumab x 6 cycles then trastuzumab alone to complete 1 year.  She had radiation.  - 4/23 patient had recurrence with left malignant pleural effusion found. She has started chemo with docetaxel/trastuzumab/pertuzumab.  - Echo  (4/23): EF 60-65%, GLS -16.8%, RV normal.  - Echo (7/23): EF 65-70%, GLS -21.3%, RV normal.  - Echo (10/23): EF 60-65%, GLS -20.4%, normal diastolic function, normal RV, mild MR - Echo (1/24): EF 60-65%, RV normal, GLS less negative at -15.5% but poor tracking of the endocardium.  - Echo (4/24): EF 55-60%, GLS -18.4%, normal diastolic function, normal RV.  - Echo (7/24): EF 55-60%, normal diastolic function, GLS -20.4%, normal RV.  - Echo (1/25): EF 55-60%, GLS -19%, RV normal, PASP 23 mmHg.   Social History   Socioeconomic History   Marital status: Married    Spouse name: Not on file   Number of children: 2   Years of education: Not on file   Highest education level: Not on file  Occupational History    Employer: [employer]  Tobacco Use   Smoking status: Never   Smokeless tobacco: Never  Substance and Sexual Activity   Alcohol use: Yes    Comment: occasional   Drug use: No   Sexual activity: Yes    Birth control/protection: Surgical  Other Topics Concern   Not on file  Social History Narrative   Not on file   Social Drivers of Health   Financial Resource Strain: Not on file  Food Insecurity: Not on file  Transportation Needs: Not on file  Physical Activity: Not on file  Stress: Not on file  Social Connections: Not on file  Intimate Partner Violence: Not on file   Family History  Problem Relation Age of Onset   Hypertension Mother    Alcohol abuse Mother    Heart disease Maternal Grandmother    Stroke  Maternal Grandfather    Colon cancer Maternal Aunt 55       alive at 56   Brain cancer Maternal Uncle 45       and lymphoma in early 33s; deceased   Brain cancer Maternal Uncle 62       deceased   Pancreatic cancer Maternal Uncle 45       alive at 16   Breast cancer Cousin 106       mat 1st cousin once removed through mat GF ; deceased   Breast cancer Maternal Aunt        great aunt through mat GF; dx at ? age   ROS: All systems reviewed and negative  except as per HPI.   Current Outpatient Medications  Medication Sig Dispense Refill   acetaminophen (TYLENOL) 500 MG tablet Take 1,000 mg by mouth every 6 (six) hours as needed for moderate pain.     amLODipine (NORVASC) 10 MG tablet Take 1 tablet (10 mg total) by mouth every morning. 30 tablet 3   KLOR-CON M20 20 MEQ tablet TAKE 1 TABLET BY MOUTH EVERY DAY 90 tablet 1   lidocaine-prilocaine (EMLA) cream Apply 1 Application topically as needed. 30 g 0   tamoxifen (NOLVADEX) 20 MG tablet Take 1 tablet (20 mg total) by mouth daily. 90 tablet 3   No current facility-administered medications for this encounter.   Facility-Administered Medications Ordered in Other Encounters  Medication Dose Route Frequency Provider Last Rate Last Admin   acetaminophen (TYLENOL) 325 MG tablet            diphenhydrAMINE (BENADRYL) 25 mg capsule            BP 122/78   Pulse 80   Wt 63.1 kg (139 lb 3.2 oz)   SpO2 100%   BMI 25.46 kg/m  General: NAD Neck: No JVD, no thyromegaly or thyroid nodule.  Lungs: Clear to auscultation bilaterally with normal respiratory effort. CV: Nondisplaced PMI.  Heart regular S1/S2, no S3/S4, no murmur.  No peripheral edema.  No carotid bruit.  Normal pedal pulses.  Abdomen: Soft, nontender, no hepatosplenomegaly, no distention.  Skin: Intact without lesions or rashes.  Neurologic: Alert and oriented x 3.  Psych: Normal affect. Extremities: No clubbing or cyanosis.  HEENT: Normal.   Assessment/Plan: 1. Stage IV triple positive breast cancer: Patient had trastuzumab as part of her therapy back in 2014 with no cardiac complications.  She has been back on trastuzumab-based therapy for > 1 year now.  Today's echo shows stable LVEF with normal strain (personally reviewed).  No exertional symptoms. She is going to be switching from Herceptin/Perjeta to Enhertu - Given switch in trastuzumab-based agent, I will arrange for repeat echo at shorter interval (3 months).  If she remains  stable, can expand back out to 6 months.   2. HTN: BP controlled on current amlodipine, continue.   Followup 3 months with echo.  Marca Ancona 07/13/2023

## 2023-07-15 NOTE — Progress Notes (Signed)
 Pharmacist Chemotherapy Monitoring - Initial Assessment    Anticipated start date: 07/22/23   The following has been reviewed per standard work regarding the patient's treatment regimen: The patient's diagnosis, treatment plan and drug doses, and organ/hematologic function Lab orders and baseline tests specific to treatment regimen  The treatment plan start date, drug sequencing, and pre-medications Prior authorization status  Patient's documented medication list, including drug-drug interaction screen and prescriptions for anti-emetics and supportive care specific to the treatment regimen The drug concentrations, fluid compatibility, administration routes, and timing of the medications to be used The patient's access for treatment and lifetime cumulative dose history, if applicable  The patient's medication allergies and previous infusion related reactions, if applicable   Changes made to treatment plan:  N/A  Follow up needed:  N/A   Sherry Barry, PharmD, MBA

## 2023-07-18 ENCOUNTER — Encounter: Payer: Self-pay | Admitting: Hematology and Oncology

## 2023-07-18 ENCOUNTER — Other Ambulatory Visit: Payer: Self-pay | Admitting: *Deleted

## 2023-07-18 DIAGNOSIS — C50211 Malignant neoplasm of upper-inner quadrant of right female breast: Secondary | ICD-10-CM

## 2023-07-18 MED ORDER — DEXAMETHASONE 4 MG PO TABS: ORAL_TABLET | ORAL | 1 refills | Status: AC

## 2023-07-18 MED ORDER — ONDANSETRON HCL 8 MG PO TABS: 8.0000 mg | ORAL_TABLET | Freq: Three times a day (TID) | ORAL | 1 refills | Status: DC | PRN

## 2023-07-18 MED ORDER — PROCHLORPERAZINE MALEATE 10 MG PO TABS: 10.0000 mg | ORAL_TABLET | Freq: Four times a day (QID) | ORAL | 1 refills | Status: DC | PRN

## 2023-07-22 ENCOUNTER — Inpatient Hospital Stay: Payer: Federal, State, Local not specified - PPO | Admitting: Hematology and Oncology

## 2023-07-22 ENCOUNTER — Telehealth: Payer: Self-pay | Admitting: *Deleted

## 2023-07-22 ENCOUNTER — Inpatient Hospital Stay: Payer: Federal, State, Local not specified - PPO

## 2023-07-22 ENCOUNTER — Inpatient Hospital Stay: Payer: Federal, State, Local not specified - PPO | Admitting: Licensed Clinical Social Worker

## 2023-07-22 ENCOUNTER — Encounter: Payer: Self-pay | Admitting: *Deleted

## 2023-07-22 ENCOUNTER — Inpatient Hospital Stay: Payer: Federal, State, Local not specified - PPO | Attending: Hematology and Oncology

## 2023-07-22 VITALS — BP 121/97 | HR 95 | Temp 98.3°F | Resp 16

## 2023-07-22 VITALS — BP 134/89 | HR 97 | Temp 98.3°F | Resp 16 | Wt 140.0 lb

## 2023-07-22 DIAGNOSIS — Z170 Estrogen receptor positive status [ER+]: Secondary | ICD-10-CM | POA: Insufficient documentation

## 2023-07-22 DIAGNOSIS — C7951 Secondary malignant neoplasm of bone: Secondary | ICD-10-CM | POA: Diagnosis not present

## 2023-07-22 DIAGNOSIS — Z923 Personal history of irradiation: Secondary | ICD-10-CM | POA: Diagnosis not present

## 2023-07-22 DIAGNOSIS — Z9221 Personal history of antineoplastic chemotherapy: Secondary | ICD-10-CM | POA: Insufficient documentation

## 2023-07-22 DIAGNOSIS — Z1721 Progesterone receptor positive status: Secondary | ICD-10-CM | POA: Diagnosis not present

## 2023-07-22 DIAGNOSIS — C50211 Malignant neoplasm of upper-inner quadrant of right female breast: Secondary | ICD-10-CM

## 2023-07-22 DIAGNOSIS — Z9013 Acquired absence of bilateral breasts and nipples: Secondary | ICD-10-CM | POA: Diagnosis not present

## 2023-07-22 DIAGNOSIS — Z1731 Human epidermal growth factor receptor 2 positive status: Secondary | ICD-10-CM | POA: Diagnosis not present

## 2023-07-22 DIAGNOSIS — Z5112 Encounter for antineoplastic immunotherapy: Secondary | ICD-10-CM | POA: Insufficient documentation

## 2023-07-22 LAB — CBC WITH DIFFERENTIAL (CANCER CENTER ONLY)
Abs Immature Granulocytes: 0.02 10*3/uL (ref 0.00–0.07)
Basophils Absolute: 0 10*3/uL (ref 0.0–0.1)
Basophils Relative: 1 %
Eosinophils Absolute: 0.2 10*3/uL (ref 0.0–0.5)
Eosinophils Relative: 3 %
HCT: 41.2 % (ref 36.0–46.0)
Hemoglobin: 14.2 g/dL (ref 12.0–15.0)
Immature Granulocytes: 0 %
Lymphocytes Relative: 32 %
Lymphs Abs: 2 10*3/uL (ref 0.7–4.0)
MCH: 28.8 pg (ref 26.0–34.0)
MCHC: 34.5 g/dL (ref 30.0–36.0)
MCV: 83.6 fL (ref 80.0–100.0)
Monocytes Absolute: 0.4 10*3/uL (ref 0.1–1.0)
Monocytes Relative: 7 %
Neutro Abs: 3.7 10*3/uL (ref 1.7–7.7)
Neutrophils Relative %: 57 %
Platelet Count: 241 10*3/uL (ref 150–400)
RBC: 4.93 MIL/uL (ref 3.87–5.11)
RDW: 12.3 % (ref 11.5–15.5)
WBC Count: 6.4 10*3/uL (ref 4.0–10.5)
nRBC: 0 % (ref 0.0–0.2)

## 2023-07-22 LAB — CMP (CANCER CENTER ONLY)
ALT: 13 U/L (ref 0–44)
AST: 17 U/L (ref 15–41)
Albumin: 4.2 g/dL (ref 3.5–5.0)
Alkaline Phosphatase: 48 U/L (ref 38–126)
Anion gap: 6 (ref 5–15)
BUN: 13 mg/dL (ref 6–20)
CO2: 28 mmol/L (ref 22–32)
Calcium: 8.7 mg/dL — ABNORMAL LOW (ref 8.9–10.3)
Chloride: 108 mmol/L (ref 98–111)
Creatinine: 0.61 mg/dL (ref 0.44–1.00)
GFR, Estimated: >60 mL/min (ref >60)
Glucose, Bld: 98 mg/dL (ref 70–99)
Potassium: 3.3 mmol/L — ABNORMAL LOW (ref 3.5–5.1)
Sodium: 142 mmol/L (ref 135–145)
Total Bilirubin: 0.4 mg/dL (ref 0.0–1.2)
Total Protein: 7 g/dL (ref 6.5–8.1)

## 2023-07-22 MED ORDER — DEXTROSE 5 % IV SOLN: INTRAVENOUS | Status: DC

## 2023-07-22 MED ORDER — SODIUM CHLORIDE 0.9% FLUSH
10.0000 mL | INTRAVENOUS | Status: DC | PRN
  Administered 2023-07-22: 10 mL

## 2023-07-22 MED ORDER — SODIUM CHLORIDE 0.9% FLUSH
10.0000 mL | Freq: Once | INTRAVENOUS | Status: AC
  Administered 2023-07-22: 10 mL

## 2023-07-22 MED ORDER — DIPHENHYDRAMINE HCL 25 MG PO CAPS
50.0000 mg | ORAL_CAPSULE | Freq: Once | ORAL | Status: AC
  Administered 2023-07-22: 50 mg via ORAL
  Filled 2023-07-22: qty 2

## 2023-07-22 MED ORDER — SODIUM CHLORIDE 0.9 % IV SOLN: Freq: Once | INTRAVENOUS | Status: DC

## 2023-07-22 MED ORDER — SODIUM CHLORIDE 0.9 % IV SOLN
150.0000 mg | Freq: Once | INTRAVENOUS | Status: AC
  Administered 2023-07-22: 150 mg via INTRAVENOUS
  Filled 2023-07-22: qty 150

## 2023-07-22 MED ORDER — ZOLEDRONIC ACID 4 MG/100ML IV SOLN
4.0000 mg | Freq: Once | INTRAVENOUS | Status: AC
  Administered 2023-07-22: 4 mg via INTRAVENOUS
  Filled 2023-07-22: qty 100

## 2023-07-22 MED ORDER — FAM-TRASTUZUMAB DERUXTECAN-NXKI CHEMO 100 MG IV SOLR
5.4000 mg/kg | Freq: Once | INTRAVENOUS | Status: AC
  Administered 2023-07-22: 340 mg via INTRAVENOUS
  Filled 2023-07-22: qty 17

## 2023-07-22 MED ORDER — PALONOSETRON HCL INJECTION 0.25 MG/5ML
0.2500 mg | Freq: Once | INTRAVENOUS | Status: AC
  Administered 2023-07-22: 0.25 mg via INTRAVENOUS
  Filled 2023-07-22: qty 5

## 2023-07-22 MED ORDER — DEXAMETHASONE SODIUM PHOSPHATE 10 MG/ML IJ SOLN
10.0000 mg | Freq: Once | INTRAMUSCULAR | Status: AC
  Administered 2023-07-22: 10 mg via INTRAVENOUS
  Filled 2023-07-22: qty 1

## 2023-07-22 MED ORDER — HEPARIN SOD (PORK) LOCK FLUSH 100 UNIT/ML IV SOLN
500.0000 [IU] | Freq: Once | INTRAVENOUS | Status: AC | PRN
  Administered 2023-07-22: 500 [IU]

## 2023-07-22 MED ORDER — ACETAMINOPHEN 325 MG PO TABS
650.0000 mg | ORAL_TABLET | Freq: Once | ORAL | Status: AC
  Administered 2023-07-22: 650 mg via ORAL
  Filled 2023-07-22: qty 2

## 2023-07-22 NOTE — Progress Notes (Signed)
 [facility] Cancer Follow up:    Sherry Artist PARAS, MD [address] [place] [address]   DIAGNOSIS:  Cancer Staging  Malignant neoplasm of upper-inner quadrant of right breast in female, estrogen receptor positive (HCC) Staging form: Breast, AJCC 7th Edition - Pathologic stage from 07/14/2012: Stage IV (TX, NX, M1) - Signed by [name], MD on 08/28/2021 Specimen type: Core Needle Biopsy Histopathologic type: 9931 Laterality: Right   SUMMARY OF ONCOLOGIC HISTORY: Oncology History  Malignant neoplasm of upper-inner quadrant of right breast in female, estrogen receptor positive (HCC)  07/13/2012 Clinical Stage   Stage IA: T1c N0   07/14/2012 Definitive Surgery   Bilateral mastectomy/right SLNB: RIGHT invasive ductal carcinoma, grade 3, ER+, PR +, Her2/neu positive (ratio 3.02), Ki67 48%. DCIS. 1/4 LN positive for malignancy. LEFT: benign   07/14/2012 Pathologic Stage   Stage IIB: T2 N1a M0   07/14/2012 Cancer Staging   Staging form: Breast, AJCC 7th Edition - Pathologic stage from 07/14/2012: Stage IV (TX, NX, M1) - Signed by [name], MD on 08/28/2021 Specimen type: Core Needle Biopsy Histopathologic type: 9931 Laterality: Right   08/17/2012 - 08/30/2013 Chemotherapy   Adjuvant carboplatin , docetaxel , and trastuzumab  x 6 cycles (completed 11/30/2012) followed by maintenance trastuzumab  to total one year   11/2012 Procedure   Comp Cancer Gene panel (GeneDx) negative for deleterious mutations    - 02/2013 Radiation Therapy   Adjuvant RT to right breast   02/2013 -  Anti-estrogen oral therapy   Tamoxifen  20 mg daily   09/24/2013 Surgery   Bilateral breast reconstruction with latissmus flap and expander placement   12/12/2013 Surgery   Implant placement    Relapse/Recurrence   Left sided malignant pleural effusion.  Tumor cells are positive for GATA3 and ER, negative for TTF-1 consistent with recurrent breast carcinoma Prognostic showed ER 90%  positive strong staining, PR 80% positive strong staining, HER2 negative.    09/16/2021 Imaging   PET scan showed malignant left pleural effusion with extensive areas of nodularity and hypermetabolic activity involving the mediastinal border and pericardium as well as the most inferior aspects of the costodiaphragmatic recess.  Signs of nodal disease at the thoracic inlet on the left suspect extension of disease below the diaphragm along the left anterolateral aorta.  Nonspecific moderate to markedly increased metabolic activity about the base of the tongue bilaterally.  Consider direct visualization showing mildly asymmetric uptake favoring the right lingual tonsil.  Sclerotic foci without marked increased metabolic activity suspicious for metastatic disease perhaps treated or questions.  Heterogeneous marrow uptake seen elsewhere generalized and nonspecific.  Consider spinal MRI is warranted  MRI brain without any evidence of intracranial metastatic disease.   10/09/2021 - 02/11/2022 Chemotherapy   Taxotere , Herceptin , Perjeta  x 6   12/04/2021 Imaging   There is no evidence new metastatic disease. There is interval decrease in the left pleural effusion possibly suggesting resolving pleural metastatic disease. Few scattered sclerotic metastatic lesions in the skeletal structures have not changed significantly.   No acute findings are seen in the chest abdomen and pelvis.     02/22/2022 -  Antibody Plan   Herceptin /Perjeta  every 3 weeks   03/21/2022 Imaging   CT chest abdomen pelvis  IMPRESSION: 1. No significant change since previous study. Again demonstrated is a chronic small left pleural effusion with basilar atelectasis and scattered sclerotic skeletal metastases. 2. No acute abnormalities are demonstrated. 3. Small esophageal hiatal hernia.     Electronically Signed   By:  [name] M.D.   On: 03/21/2022 20:23   07/22/2023 -  Chemotherapy   Patient is on Treatment Plan :  BREAST Fam-Trastuzumab Deruxtecan-nxki  (Enhertu ) (5.4) q21d       CURRENT THERAPY:Herceptin /Perjeta ; tamoxifen   INTERVAL HISTORY:  Discussed the use of AI scribe software for clinical note transcription with the patient, who gave verbal consent to proceed.  History of Present Illness           Sherry Barry 53 y.o. female returns for f/u of her metastatic breast cancer prior to receiving Enhertu .  She is experiencing fatigue, which she attributes to her ongoing cancer treatment. She is exploring supportive treatments for fatigue, such as IV fluids, which she has heard about from a support group.  She is concerned about the side effects of her current medications and inquires about the continuation of Zometa  and Zoladex . She confirms that she does not have a menstrual cycle due to a prior hysterectomy.   She expresses concern about potential side effects of her cancer treatment, particularly pneumonitis, due to her history of pneumonia and persistent cough. She is aware of the need to report any new pulmonary symptoms.  She has received the first dose of the Shingrix vaccine a couple of months ago and is inquiring about the safety of receiving the second dose. Rest of the pertinent 10 point ROS reviewed and negative  Patient Active Problem List   Diagnosis Date Noted   Tachycardia 12/10/2021   Chemotherapy induced diarrhea 10/16/2021   Malignant pleural effusion 10/16/2021   CAP (community acquired pneumonia) 08/10/2021   Breast asymmetry following reconstructive surgery 09/09/2015   Status post bilateral breast reconstruction 12/20/2013   Acquired absence of bilateral breasts and nipples 09/24/2013   History of breast cancer 08/17/2013   Anxiety 06/28/2013   Eczema 06/28/2013   Malignant neoplasm of upper-inner quadrant of right breast in female, estrogen receptor positive (HCC) 06/20/2012   Essential hypertension 05/02/2012   Migraine 04/17/2012    is allergic to  codeine, hydrocodone , latex, lisinopril , and tomato.  MEDICAL HISTORY: Past Medical History:  Diagnosis Date   Allergy    Breast cancer (HCC)    Breast cancer (HCC)    right   History of chemotherapy    doxetaxel/carboplatin /trastuzumab    History of migraine    last one about a week ago   Hx of radiation therapy 01/01/13- 02/15/13   r chest wall, r supraclav/axilla 5040 cGy/28 sessions, scar boost 1000 cGy/5 sessions   Hypertension    no meds,   urgent care on pomana   Migraine    Migraine     SURGICAL HISTORY: Past Surgical History:  Procedure Laterality Date   ABDOMINAL HYSTERECTOMY     no salpingo-oophorectomy 2009   BREAST RECONSTRUCTION WITH PLACEMENT OF TISSUE EXPANDER AND FLEX HD (ACELLULAR HYDRATED DERMIS) Left 09/24/2013   IR IMAGING GUIDED PORT INSERTION  10/07/2021   IR THORACENTESIS ASP PLEURAL SPACE W/IMG GUIDE  08/11/2021   IR THORACENTESIS ASP PLEURAL SPACE W/IMG GUIDE  10/07/2021   LATISSIMUS FLAP TO BREAST Right 09/24/2013   Procedure: RIGHT LATISSMUS MYOCUTAEIOUS MUSCLE FLAP AND PLACEMENT OF TISSUE LESLEIGH;  Surgeon: Estefana Reichert, DO;  Location: [facility];  Service: Plastics;  Laterality: Right;   LIPOSUCTION WITH LIPOFILLING Bilateral 12/12/2013   Procedure: LIPOSUCTION WITH LIPOFILLING;  Surgeon: Estefana Reichert, DO;  Location: [facility];  Service: Plastics;  Laterality: Bilateral;   PORT-A-CATH REMOVAL Left 12/12/2013   Procedure: REMOVAL PORT-A-CATH;  Surgeon: Estefana Reichert, DO;  Location: [facility];  Service: Plastics;  Laterality: Left;   PORTACATH PLACEMENT  07/14/2012   Procedure: INSERTION PORT-A-CATH;  Surgeon: Debby LABOR. Cornett, MD;  Location: [facility];  Service: General;  Laterality: Left;   RECONSTRUCTION BREAST W/ LATISSIMUS DORSI FLAP Right 09/24/2013   & tissue expander placement   REMOVAL OF BILATERAL TISSUE EXPANDERS WITH PLACEMENT OF BILATERAL BREAST IMPLANTS Bilateral 12/12/2013   Procedure: REMOVAL OF BILATERAL TISSUE  EXPANDERS WITH PLACEMENT OF BILATERAL BREAST IMPLANTS/BILATERAL CAPSULECTOMIES WITH  LIPOFILLING FAT GRAFTING;  Surgeon: Estefana Reichert, DO;  Location: [facility];  Service: Plastics;  Laterality: Bilateral;   SIMPLE MASTECTOMY WITH AXILLARY SENTINEL NODE BIOPSY  07/14/2012   Procedure: SIMPLE MASTECTOMY WITH AXILLARY SENTINEL NODE BIOPSY;  Surgeon: Debby LABOR. Cornett, MD;  Location: [facility];  Service: General;  Laterality: Right;  Bilateral simple mastectomy with port and right sebtibel lymph node mapping   SIMPLE MASTECTOMY WITH AXILLARY SENTINEL NODE BIOPSY  07/14/2012   Procedure: SIMPLE MASTECTOMY;  Surgeon: Debby LABOR. Cornett, MD;  Location: [facility];  Service: General;  Laterality: Left;   TISSUE EXPANDER PLACEMENT Left 09/24/2013   Procedure: PLACEMENT OF TISSUE EXPANDER AND FLEX HD TO LEFT BREAST;  Surgeon: Estefana Reichert, DO;  Location: [facility];  Service: Plastics;  Laterality: Left;    SOCIAL HISTORY: Social History   Socioeconomic History   Marital status: Married    Spouse name: Not on file   Number of children: 2   Years of education: Not on file   Highest education level: Not on file  Occupational History    Employer: [employer]  Tobacco Use   Smoking status: Never   Smokeless tobacco: Never  Substance and Sexual Activity   Alcohol use: Yes    Comment: occasional   Drug use: No   Sexual activity: Yes    Birth control/protection: Surgical  Other Topics Concern   Not on file  Social History Narrative   Not on file   Social Drivers of Health   Financial Resource Strain: Not on file  Food Insecurity: Not on file  Transportation Needs: Not on file  Physical Activity: Not on file  Stress: Not on file  Social Connections: Not on file  Intimate Partner Violence: Not on file    FAMILY HISTORY: Family History  Problem Relation Age of Onset   Hypertension Mother    Alcohol abuse Mother    Heart disease Maternal Grandmother    Stroke Maternal Grandfather     Colon cancer Maternal Aunt 55       alive at 50   Brain cancer Maternal Uncle 27       and lymphoma in early 49s; deceased   Brain cancer Maternal Uncle 60       deceased   Pancreatic cancer Maternal Uncle 45       alive at 48   Breast cancer Cousin 103       mat 1st cousin once removed through mat GF ; deceased   Breast cancer Maternal Aunt        great aunt through mat GF; dx at ? age    Review of Systems  Constitutional:  Negative for appetite change, chills, fatigue, fever and unexpected weight change.  HENT:   Negative for hearing loss, lump/mass and trouble swallowing.   Eyes:  Negative for eye problems and icterus.  Respiratory:  Negative for chest tightness, cough and shortness of breath.   Cardiovascular:  Negative for chest pain,  leg swelling and palpitations.  Gastrointestinal:  Negative for abdominal distention, abdominal pain, constipation, diarrhea, nausea and vomiting.  Endocrine: Negative for hot flashes.  Genitourinary:  Negative for difficulty urinating.   Musculoskeletal:  Negative for arthralgias.  Skin:  Negative for itching and rash.  Neurological:  Negative for dizziness, extremity weakness, headaches and numbness.  Hematological:  Negative for adenopathy. Does not bruise/bleed easily.  Psychiatric/Behavioral:  Negative for depression. The patient is not nervous/anxious.       PHYSICAL EXAMINATION    Vitals:   07/22/23 1305  BP: 134/89  Pulse: 97  Resp: 16  Temp: 98.3 F (36.8 C)  SpO2: 100%    Physical Exam Constitutional:      General: She is not in acute distress.    Appearance: Normal appearance. She is not toxic-appearing.  HENT:     Head: Normocephalic and atraumatic.  Eyes:     General: No scleral icterus. Cardiovascular:     Rate and Rhythm: Normal rate and regular rhythm.     Pulses: Normal pulses.     Heart sounds: Normal heart sounds.  Pulmonary:     Effort: Pulmonary effort is normal.     Breath sounds: Normal breath sounds.   Abdominal:     General: Abdomen is flat. Bowel sounds are normal. There is no distension.     Palpations: Abdomen is soft.     Tenderness: There is no abdominal tenderness.  Musculoskeletal:        General: No swelling.     Cervical back: Neck supple.  Lymphadenopathy:     Cervical: No cervical adenopathy.  Skin:    General: Skin is warm and dry.     Findings: No rash.  Neurological:     General: No focal deficit present.     Mental Status: She is alert.  Psychiatric:        Mood and Affect: Mood normal.        Behavior: Behavior normal.     LABORATORY DATA:  CBC    Component Value Date/Time   WBC 6.4 07/22/2023 1237   WBC 5.4 09/13/2022 1656   RBC 4.93 07/22/2023 1237   HGB 14.2 07/22/2023 1237   HGB 15.9 10/30/2019 1030   HGB 14.7 02/15/2017 0852   HCT 41.2 07/22/2023 1237   HCT 46.1 10/30/2019 1030   HCT 42.9 02/15/2017 0852   PLT 241 07/22/2023 1237   PLT 245 10/30/2019 1030   MCV 83.6 07/22/2023 1237   MCV 88 10/30/2019 1030   MCV 86.7 02/15/2017 0852   MCH 28.8 07/22/2023 1237   MCHC 34.5 07/22/2023 1237   RDW 12.3 07/22/2023 1237   RDW 12.5 10/30/2019 1030   RDW 12.6 02/15/2017 0852   LYMPHSABS 2.0 07/22/2023 1237   LYMPHSABS 1.4 02/15/2017 0852   MONOABS 0.4 07/22/2023 1237   MONOABS 0.3 02/15/2017 0852   EOSABS 0.2 07/22/2023 1237   EOSABS 0.1 02/15/2017 0852   BASOSABS 0.0 07/22/2023 1237   BASOSABS 0.0 02/15/2017 0852    CMP     Component Value Date/Time   NA 142 07/22/2023 1237   NA 140 10/30/2019 1030   NA 141 02/15/2017 0852   K 3.3 (L) 07/22/2023 1237   K 3.9 02/15/2017 0852   CL 108 07/22/2023 1237   CL 101 11/15/2012 0904   CO2 28 07/22/2023 1237   CO2 25 02/15/2017 0852   GLUCOSE 98 07/22/2023 1237   GLUCOSE 87 02/15/2017 0852   GLUCOSE 69 (L) 11/15/2012 9095  BUN 13 07/22/2023 1237   BUN 11 10/30/2019 1030   BUN 12.5 02/15/2017 0852   CREATININE 0.61 07/22/2023 1237   CREATININE 0.9 02/15/2017 0852   CALCIUM 8.7 (L)  07/22/2023 1237   CALCIUM 9.3 02/15/2017 0852   PROT 7.0 07/22/2023 1237   PROT 6.7 10/30/2019 1030   PROT 7.0 02/15/2017 0852   ALBUMIN 4.2 07/22/2023 1237   ALBUMIN 4.2 10/30/2019 1030   ALBUMIN 3.6 02/15/2017 0852   AST 17 07/22/2023 1237   AST 16 02/15/2017 0852   ALT 13 07/22/2023 1237   ALT 13 02/15/2017 0852   ALKPHOS 48 07/22/2023 1237   ALKPHOS 63 02/15/2017 0852   BILITOT 0.4 07/22/2023 1237   BILITOT 0.69 02/15/2017 0852   GFRNONAA >60 07/22/2023 1237   GFRAA 100 10/30/2019 1030       ASSESSMENT and THERAPY PLAN:   Malignant neoplasm of upper-inner quadrant of right breast in female, estrogen receptor positive (HCC) Sherry Barry is a 53 year old woman with stage IV triple positive breast cancer s/p first line THP and then on HP maintenance now with progression andis here to initiate second line with Enhertu .  Breast Cancer Patient is about to start Enhertu . Discussed potential side effects including fatigue, nausea, and pneumonitis. Echocardiogram was normal. -Start Enhertu . -Discontinue Tamoxifen . -Continue Zometa  for bone health. -Monitor for side effects, particularly pneumonitis. Report any new pulmonary symptoms immediately.  Shingles Vaccination Patient received first dose of Shingrix a few months ago and is due for the second dose. -ok to proceed with dead virus vaccine, all live attentuated vaccines need to be postponed.  Jury Duty Patient has jury duty in March and is requesting a medical excuse. -Write a medical excuse for jury duty.  Follow-up  -Ensure Zometa  is administered today. -Request patient to send a MyChat message next week with an update on how she is doing after starting Enhertu .   All questions were answered. The patient knows to call the clinic with any problems, questions or concerns. We can certainly see the patient much sooner if necessary.  Total encounter time:30 minutes*in face-to-face visit time, chart review, lab review, care  coordination, order entry, and documentation of the encounter time.  *Total Encounter Time as defined by the Centers for Medicare and Medicaid Services includes, in addition to the face-to-face time of a patient visit (documented in the note above) non-face-to-face time: obtaining and reviewing outside history, ordering and reviewing medications, tests or procedures, care coordination (communications with other health care professionals or caregivers) and documentation in the medical record.

## 2023-07-22 NOTE — Telephone Encounter (Signed)
 Proceed with Zometa  with calcium of 8.7 - pt to supplement with OTC oral calcium

## 2023-07-22 NOTE — Progress Notes (Signed)
 [facility] Clinical Social Work  Clinical Social Work was referred by  stage IV pt protocol  for assessment of psychosocial needs.  Clinical Social Worker met with patient to offer support and assess for needs.  Pt's husband present at side in infusion.  CSW introduced self & support services to patient. Discussed various resources and programs available, including advanced directives clinic. Pt does not currently have AD documents in place.  CSW encouraged pt to call if interested in completing or attending any other programs.     Coda Mathey E Sterlin Knightly, LCSW  Clinical Social Worker [facility]

## 2023-07-22 NOTE — Assessment & Plan Note (Signed)
 Sherry Barry is a 53 year old woman with stage IV triple positive breast cancer s/p first line THP and then on HP maintenance now with progression andis here to initiate second line with Enhertu .  Breast Cancer Patient is about to start Enhertu . Discussed potential side effects including fatigue, nausea, and pneumonitis. Echocardiogram was normal. -Start Enhertu . -Discontinue Tamoxifen . -Continue Zometa  for bone health. -Monitor for side effects, particularly pneumonitis. Report any new pulmonary symptoms immediately.  Shingles Vaccination Patient received first dose of Shingrix a few months ago and is due for the second dose. -ok to proceed with dead virus vaccine, all live attentuated vaccines need to be postponed.  Jury Duty Patient has jury duty in March and is requesting a medical excuse. -Write a medical excuse for jury duty.  Follow-up  -Ensure Zometa  is administered today. -Request patient to send a MyChat message next week with an update on how she is doing after starting Enhertu .

## 2023-07-22 NOTE — Patient Instructions (Signed)
 [facility] - A DEPT OF [facility]  Discharge Instructions: Thank you for choosing [facility] to provide your oncology and hematology care.   If you have a lab appointment with the Cancer Center, please go directly to the Cancer Center and check in at the registration area.   Wear comfortable clothing and clothing appropriate for easy access to any Portacath or PICC line.   We strive to give you quality time with your provider. You may need to reschedule your appointment if you arrive late (15 or more minutes).  Arriving late affects you and other patients whose appointments are after yours.  Also, if you miss three or more appointments without notifying the office, you may be dismissed from the clinic at the provider's discretion.      For prescription refill requests, have your pharmacy contact our office and allow 72 hours for refills to be completed.    Today you received the following chemotherapy and/or immunotherapy agents Enhertu      To help prevent nausea and vomiting after your treatment, we encourage you to take your nausea medication as directed.  BELOW ARE SYMPTOMS THAT SHOULD BE REPORTED IMMEDIATELY: *FEVER GREATER THAN 100.4 F (38 C) OR HIGHER *CHILLS OR SWEATING *NAUSEA AND VOMITING THAT IS NOT CONTROLLED WITH YOUR NAUSEA MEDICATION *UNUSUAL SHORTNESS OF BREATH *UNUSUAL BRUISING OR BLEEDING *URINARY PROBLEMS (pain or burning when urinating, or frequent urination) *BOWEL PROBLEMS (unusual diarrhea, constipation, pain near the anus) TENDERNESS IN MOUTH AND THROAT WITH OR WITHOUT PRESENCE OF ULCERS (sore throat, sores in mouth, or a toothache) UNUSUAL RASH, SWELLING OR PAIN  UNUSUAL VAGINAL DISCHARGE OR ITCHING   Items with * indicate a potential emergency and should be followed up as soon as possible or go to the Emergency Department if any problems should occur.  Please show the CHEMOTHERAPY ALERT CARD or IMMUNOTHERAPY  ALERT CARD at check-in to the Emergency Department and triage nurse.  Should you have questions after your visit or need to cancel or reschedule your appointment, please contact [facility]  Dept: [phone]  and follow the prompts.  Office hours are 8:00 a.m. to 4:30 p.m. Monday - Friday. Please note that voicemails left after 4:00 p.m. may not be returned until the following business day.  We are closed weekends and major holidays. You have access to a nurse at all times for urgent questions. Please call the main number to the clinic Dept: [phone] and follow the prompts.   For any non-urgent questions, you may also contact your provider using MyChart. We now offer e-Visits for anyone 58 and older to request care online for non-urgent symptoms. For details visit [email or web address].   Also download the MyChart app! Go to the app store, search "MyChart", open the app, select [facility], and log in with your MyChart username and password.  Fam-Trastuzumab Deruxtecan Injection What is this medication? FAM-TRASTUZUMAB DERUXTECAN (fam-tras TOOZ eu mab DER ux TEE kan) treats some types of cancer. It works by blocking a protein that causes cancer cells to grow and multiply. This helps to slow or stop the spread of cancer cells. This medicine may be used for other purposes; ask your health care provider or pharmacist if you have questions. COMMON BRAND NAME(S): ENHERTU What should I tell my care team before I take this medication? They need to know if you have any of these  conditions: Heart disease Heart failure Infection, especially a viral infection, such as chickenpox, cold sores, or herpes Liver disease Lung or breathing disease, such as asthma or COPD An unusual or allergic reaction to fam-trastuzumab deruxtecan, other medications, foods, dyes, or preservatives Pregnant or trying to get pregnant Breast-feeding How should I use  this medication? This medication is injected into a vein. It is given by your care team in a hospital or clinic setting. A special MedGuide will be given to you before each treatment. Be sure to read this information carefully each time. Talk to your care team about the use of this medication in children. Special care may be needed. Overdosage: If you think you have taken too much of this medicine contact a poison control center or emergency room at once. NOTE: This medicine is only for you. Do not share this medicine with others. What if I miss a dose? It is important not to miss your dose. Call your care team if you are unable to keep an appointment. What may interact with this medication? Interactions are not expected. This list may not describe all possible interactions. Give your health care provider a list of all the medicines, herbs, non-prescription drugs, or dietary supplements you use. Also tell them if you smoke, drink alcohol, or use illegal drugs. Some items may interact with your medicine. What should I watch for while using this medication? Visit your care team for regular checks on your progress. Tell your care team if your symptoms do not start to get better or if they get worse. This medication may increase your risk of getting an infection. Call your care team for advice if you get a fever, chills, sore throat, or other symptoms of a cold or flu. Do not treat yourself. Try to avoid being around people who are sick. Avoid taking medications that contain aspirin, acetaminophen, ibuprofen, naproxen, or ketoprofen unless instructed by your care team. These medications may hide a fever. Be careful brushing or flossing your teeth or using a toothpick because you may get an infection or bleed more easily. If you have any dental work done, tell your dentist you are receiving this medication. This medication may cause dry eyes and blurred vision. If you wear contact lenses, you may feel  some discomfort. Lubricating eye drops may help. See your care team if the problem does not go away or is severe. Talk to your care team if you may be pregnant. Serious birth defects can occur if you take this medication during pregnancy and for 7 months after the last dose. If your partner can get pregnant, use a condom during sex while taking this medication and for 4 months after the last dose. Do not breastfeed while taking this medication and for 7 months after the last dose. This medication may cause infertility. Talk to your care team if you are concerned about your fertility. What side effects may I notice from receiving this medication? Side effects that you should report to your care team as soon as possible: Allergic reactions--skin rash, itching, hives, swelling of the face, lips, tongue, or throat Dry cough, shortness of breath or trouble breathing Infection--fever, chills, cough, sore throat, wounds that don't heal, pain or trouble when passing urine, general feeling of discomfort or being unwell Heart failure--shortness of breath, swelling of the ankles, feet, or hands, sudden weight gain, unusual weakness or fatigue Unusual bruising or bleeding Side effects that usually do not require medical attention (report these to your care  team if they continue or are bothersome): Constipation Diarrhea Hair loss Muscle pain Nausea Vomiting This list may not describe all possible side effects. Call your doctor for medical advice about side effects. You may report side effects to FDA at 1-800-FDA-1088. Where should I keep my medication? This medication is given in a hospital or clinic. It will not be stored at home. NOTE: This sheet is a summary. It may not cover all possible information. If you have questions about this medicine, talk to your doctor, pharmacist, or health care provider.  2024 [employer]/Gold Standard (2023-01-28 00:00:00)

## 2023-08-10 ENCOUNTER — Encounter: Payer: Self-pay | Admitting: Hematology and Oncology

## 2023-08-12 ENCOUNTER — Inpatient Hospital Stay: Payer: Federal, State, Local not specified - PPO

## 2023-08-12 ENCOUNTER — Inpatient Hospital Stay: Payer: Federal, State, Local not specified - PPO | Admitting: Adult Health

## 2023-08-17 MED FILL — Fosaprepitant Dimeglumine For IV Infusion 150 MG (Base Eq): INTRAVENOUS | Qty: 5 | Status: AC

## 2023-08-18 ENCOUNTER — Inpatient Hospital Stay: Payer: Federal, State, Local not specified - PPO | Attending: Hematology and Oncology

## 2023-08-18 ENCOUNTER — Inpatient Hospital Stay: Payer: Federal, State, Local not specified - PPO

## 2023-08-18 ENCOUNTER — Inpatient Hospital Stay: Payer: Federal, State, Local not specified - PPO | Admitting: Hematology and Oncology

## 2023-08-18 VITALS — BP 128/84 | HR 101 | Temp 98.0°F | Resp 16 | Wt 140.4 lb

## 2023-08-18 VITALS — BP 125/95 | HR 87 | Temp 98.0°F | Resp 16

## 2023-08-18 DIAGNOSIS — Z5112 Encounter for antineoplastic immunotherapy: Secondary | ICD-10-CM | POA: Diagnosis not present

## 2023-08-18 DIAGNOSIS — Z923 Personal history of irradiation: Secondary | ICD-10-CM | POA: Diagnosis not present

## 2023-08-18 DIAGNOSIS — C50211 Malignant neoplasm of upper-inner quadrant of right female breast: Secondary | ICD-10-CM | POA: Diagnosis not present

## 2023-08-18 DIAGNOSIS — Z1732 Human epidermal growth factor receptor 2 negative status: Secondary | ICD-10-CM | POA: Diagnosis not present

## 2023-08-18 DIAGNOSIS — Z1721 Progesterone receptor positive status: Secondary | ICD-10-CM | POA: Insufficient documentation

## 2023-08-18 DIAGNOSIS — Z170 Estrogen receptor positive status [ER+]: Secondary | ICD-10-CM

## 2023-08-18 DIAGNOSIS — Z9221 Personal history of antineoplastic chemotherapy: Secondary | ICD-10-CM | POA: Diagnosis not present

## 2023-08-18 DIAGNOSIS — Z9013 Acquired absence of bilateral breasts and nipples: Secondary | ICD-10-CM | POA: Diagnosis not present

## 2023-08-18 DIAGNOSIS — C7951 Secondary malignant neoplasm of bone: Secondary | ICD-10-CM | POA: Diagnosis not present

## 2023-08-18 LAB — CBC WITH DIFFERENTIAL (CANCER CENTER ONLY)
Abs Immature Granulocytes: 0.02 10*3/uL (ref 0.00–0.07)
Basophils Absolute: 0 10*3/uL (ref 0.0–0.1)
Basophils Relative: 1 %
Eosinophils Absolute: 0.2 10*3/uL (ref 0.0–0.5)
Eosinophils Relative: 3 %
HCT: 41.7 % (ref 36.0–46.0)
Hemoglobin: 14.6 g/dL (ref 12.0–15.0)
Immature Granulocytes: 0 %
Lymphocytes Relative: 33 %
Lymphs Abs: 2.4 10*3/uL (ref 0.7–4.0)
MCH: 29.8 pg (ref 26.0–34.0)
MCHC: 35 g/dL (ref 30.0–36.0)
MCV: 85.1 fL (ref 80.0–100.0)
Monocytes Absolute: 0.5 10*3/uL (ref 0.1–1.0)
Monocytes Relative: 6 %
Neutro Abs: 4.2 10*3/uL (ref 1.7–7.7)
Neutrophils Relative %: 57 %
Platelet Count: 325 10*3/uL (ref 150–400)
RBC: 4.9 MIL/uL (ref 3.87–5.11)
RDW: 13.2 % (ref 11.5–15.5)
WBC Count: 7.3 10*3/uL (ref 4.0–10.5)
nRBC: 0 % (ref 0.0–0.2)

## 2023-08-18 LAB — CMP (CANCER CENTER ONLY)
ALT: 11 U/L (ref 0–44)
AST: 16 U/L (ref 15–41)
Albumin: 4.4 g/dL (ref 3.5–5.0)
Alkaline Phosphatase: 48 U/L (ref 38–126)
Anion gap: 8 (ref 5–15)
BUN: 12 mg/dL (ref 6–20)
CO2: 28 mmol/L (ref 22–32)
Calcium: 8.7 mg/dL — ABNORMAL LOW (ref 8.9–10.3)
Chloride: 106 mmol/L (ref 98–111)
Creatinine: 0.58 mg/dL (ref 0.44–1.00)
GFR, Estimated: >60 mL/min (ref >60)
Glucose, Bld: 84 mg/dL (ref 70–99)
Potassium: 3.2 mmol/L — ABNORMAL LOW (ref 3.5–5.1)
Sodium: 142 mmol/L (ref 135–145)
Total Bilirubin: 0.3 mg/dL (ref 0.0–1.2)
Total Protein: 7.1 g/dL (ref 6.5–8.1)

## 2023-08-18 MED ORDER — SODIUM CHLORIDE 0.9% FLUSH: 10.0000 mL | INTRAVENOUS | Status: DC | PRN

## 2023-08-18 MED ORDER — HEPARIN SOD (PORK) LOCK FLUSH 100 UNIT/ML IV SOLN
500.0000 [IU] | Freq: Once | INTRAVENOUS | Status: DC | PRN
Start: 2023-08-18 — End: 2023-08-18

## 2023-08-18 MED ORDER — SODIUM CHLORIDE 0.9% FLUSH
10.0000 mL | Freq: Once | INTRAVENOUS | Status: AC
  Administered 2023-08-18: 10 mL

## 2023-08-18 MED ORDER — DEXAMETHASONE SODIUM PHOSPHATE 10 MG/ML IJ SOLN
10.0000 mg | Freq: Once | INTRAMUSCULAR | Status: AC
  Administered 2023-08-18: 10 mg via INTRAVENOUS
  Filled 2023-08-18: qty 1

## 2023-08-18 MED ORDER — PALONOSETRON HCL INJECTION 0.25 MG/5ML
0.2500 mg | Freq: Once | INTRAVENOUS | Status: AC
  Administered 2023-08-18: 0.25 mg via INTRAVENOUS
  Filled 2023-08-18: qty 5

## 2023-08-18 MED ORDER — ACETAMINOPHEN 325 MG PO TABS
650.0000 mg | ORAL_TABLET | Freq: Once | ORAL | Status: AC
  Administered 2023-08-18: 650 mg via ORAL
  Filled 2023-08-18: qty 2

## 2023-08-18 MED ORDER — FOSAPREPITANT DIMEGLUMINE INJECTION 150 MG
150.0000 mg | Freq: Once | INTRAVENOUS | Status: AC
  Administered 2023-08-18: 150 mg via INTRAVENOUS
  Filled 2023-08-18: qty 150

## 2023-08-18 MED ORDER — FAM-TRASTUZUMAB DERUXTECAN-NXKI CHEMO 100 MG IV SOLR
5.4000 mg/kg | Freq: Once | INTRAVENOUS | Status: AC
  Administered 2023-08-18: 340 mg via INTRAVENOUS
  Filled 2023-08-18: qty 17

## 2023-08-18 MED ORDER — DIPHENHYDRAMINE HCL 25 MG PO CAPS
50.0000 mg | ORAL_CAPSULE | Freq: Once | ORAL | Status: AC
  Administered 2023-08-18: 50 mg via ORAL
  Filled 2023-08-18: qty 2

## 2023-08-18 MED ORDER — DEXTROSE 5 % IV SOLN: INTRAVENOUS | Status: DC

## 2023-08-18 NOTE — Progress Notes (Signed)
 [facility] Cancer Follow up:    Sherry Bur, MD [address] [place] [address]   DIAGNOSIS:  Cancer Staging  Malignant neoplasm of upper-inner quadrant of right breast in female, estrogen receptor positive (HCC) Staging form: Breast, AJCC 7th Edition - Pathologic stage from 07/14/2012: Stage IV (TX, NX, M1) - Signed by Rachel Moulds, MD on 08/28/2021 Specimen type: Core Needle Biopsy Histopathologic type: 9931 Laterality: Right   SUMMARY OF ONCOLOGIC HISTORY: Oncology History  Malignant neoplasm of upper-inner quadrant of right breast in female, estrogen receptor positive (HCC)  07/13/2012 Clinical Stage   Stage IA: T1c N0   07/14/2012 Definitive Surgery   Bilateral mastectomy/right SLNB: RIGHT invasive ductal carcinoma, grade 3, ER+, PR +, Her2/neu positive (ratio 3.02), Ki67 48%. DCIS. 1/4 LN positive for malignancy. LEFT: benign   07/14/2012 Pathologic Stage   Stage IIB: T2 N1a M0   07/14/2012 Cancer Staging   Staging form: Breast, AJCC 7th Edition - Pathologic stage from 07/14/2012: Stage IV (TX, NX, M1) - Signed by Rachel Moulds, MD on 08/28/2021 Specimen type: Core Needle Biopsy Histopathologic type: 9931 Laterality: Right   08/17/2012 - 08/30/2013 Chemotherapy   Adjuvant carboplatin, docetaxel, and trastuzumab x 6 cycles (completed 11/30/2012) followed by maintenance trastuzumab to total one year   11/2012 Procedure   Comp Cancer Gene panel (GeneDx) negative for deleterious mutations    - 02/2013 Radiation Therapy   Adjuvant RT to right breast   02/2013 -  Anti-estrogen oral therapy   Tamoxifen 20 mg daily   09/24/2013 Surgery   Bilateral breast reconstruction with latissmus flap and expander placement   12/12/2013 Surgery   Implant placement    Relapse/Recurrence   Left sided malignant pleural effusion.  Tumor cells are positive for GATA3 and ER, negative for TTF-1 consistent with recurrent breast carcinoma Prognostic showed ER 90%  positive strong staining, PR 80% positive strong staining, HER2 negative.    09/16/2021 Imaging   PET scan showed malignant left pleural effusion with extensive areas of nodularity and hypermetabolic activity involving the mediastinal border and pericardium as well as the most inferior aspects of the costodiaphragmatic recess.  Signs of nodal disease at the thoracic inlet on the left suspect extension of disease below the diaphragm along the left anterolateral aorta.  Nonspecific moderate to markedly increased metabolic activity about the base of the tongue bilaterally.  Consider direct visualization showing mildly asymmetric uptake favoring the right lingual tonsil.  Sclerotic foci without marked increased metabolic activity suspicious for metastatic disease perhaps treated or questions.  Heterogeneous marrow uptake seen elsewhere generalized and nonspecific.  Consider spinal MRI is warranted  MRI brain without any evidence of intracranial metastatic disease.   10/09/2021 - 02/11/2022 Chemotherapy   Taxotere, Herceptin, Perjeta x 6   12/04/2021 Imaging   There is no evidence new metastatic disease. There is interval decrease in the left pleural effusion possibly suggesting resolving pleural metastatic disease. Few scattered sclerotic metastatic lesions in the skeletal structures have not changed significantly.   No acute findings are seen in the chest abdomen and pelvis.     02/22/2022 -  Antibody Plan   Herceptin/Perjeta every 3 weeks   03/21/2022 Imaging   CT chest abdomen pelvis  IMPRESSION: 1. No significant change since previous study. Again demonstrated is a chronic small left pleural effusion with basilar atelectasis and scattered sclerotic skeletal metastases. 2. No acute abnormalities are demonstrated. 3. Small esophageal hiatal hernia.     Electronically Signed   By:  Burman Nieves M.D.   On: 03/21/2022 20:23   07/22/2023 -  Chemotherapy   Patient is on Treatment Plan :  BREAST Fam-Trastuzumab Deruxtecan-nxki (Enhertu) (5.4) q21d       CURRENT THERAPY:Herceptin/Perjeta; tamoxifen  INTERVAL HISTORY:  Discussed the use of AI scribe software for clinical note transcription with the patient, who gave verbal consent to proceed.  History of Present Illness         Sherry Barry is a 53 year old female who presents for follow-up after chemotherapy treatment. She tolerated first cycle of Enhertu really well  She is experiencing mild to moderate nausea and fatigue as side effects of her recent chemotherapy treatment. The nausea is effectively managed with pre-treatment antiemetics, and she has not experienced any vomiting.  Initially, she experienced constipation following chemotherapy, for which she took Miralax. This led to an increase in blood pressure and subsequently transitioned to diarrhea. Currently, she feels well and has no ongoing gastrointestinal issues.  No fevers, chills, or shortness of breath. Her cough, which was previously present, has improved and is now only triggered by exposure to pollen. No issues with urination.   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  MEDICAL HISTORY: Past Medical History:   Diagnosis Date   Allergy    Breast cancer (HCC)    Breast cancer (HCC)    right   History of chemotherapy    doxetaxel/carboplatin/trastuzumab   History of migraine    last one about a week ago   Hx of radiation therapy 01/01/13- 02/15/13   r chest wall, r supraclav/axilla 5040 cGy/28 sessions, scar boost 1000 cGy/5 sessions   Hypertension    no meds,   urgent care on pomana   Migraine    Migraine     SURGICAL HISTORY: Past Surgical History:  Procedure Laterality Date   ABDOMINAL HYSTERECTOMY     no salpingo-oophorectomy 2009   BREAST RECONSTRUCTION WITH PLACEMENT OF TISSUE EXPANDER AND FLEX HD (ACELLULAR HYDRATED DERMIS) Left 09/24/2013   IR IMAGING GUIDED PORT INSERTION  10/07/2021   IR THORACENTESIS ASP PLEURAL SPACE W/IMG GUIDE  08/11/2021   IR THORACENTESIS ASP PLEURAL SPACE W/IMG GUIDE  10/07/2021   LATISSIMUS FLAP TO BREAST Right 09/24/2013   Procedure: RIGHT LATISSMUS MYOCUTAEIOUS MUSCLE FLAP AND PLACEMENT OF TISSUE Lurena Nida;  Surgeon: Wayland Denis, DO;  Location: [facility];  Service: Plastics;  Laterality: Right;   LIPOSUCTION WITH LIPOFILLING Bilateral 12/12/2013   Procedure: LIPOSUCTION WITH LIPOFILLING;  Surgeon: Wayland Denis, DO;  Location: [facility];  Service: Plastics;  Laterality: Bilateral;   PORT-A-CATH REMOVAL Left 12/12/2013   Procedure: REMOVAL PORT-A-CATH;  Surgeon: Wayland Denis, DO;  Location: [facility];  Service: Plastics;  Laterality: Left;   PORTACATH PLACEMENT  07/14/2012   Procedure: INSERTION PORT-A-CATH;  Surgeon: Clovis Pu. Cornett, MD;  Location: [facility];  Service: General;  Laterality: Left;   RECONSTRUCTION BREAST W/ LATISSIMUS DORSI FLAP Right 09/24/2013   "& tissue expander placement"   REMOVAL OF BILATERAL TISSUE EXPANDERS WITH PLACEMENT OF BILATERAL BREAST IMPLANTS Bilateral 12/12/2013   Procedure: REMOVAL OF BILATERAL TISSUE EXPANDERS WITH PLACEMENT OF BILATERAL BREAST IMPLANTS/BILATERAL CAPSULECTOMIES WITH  LIPOFILLING FAT  GRAFTING;  Surgeon: Wayland Denis, DO;  Location: [facility];  Service: Plastics;  Laterality: Bilateral;   SIMPLE MASTECTOMY WITH AXILLARY SENTINEL NODE BIOPSY  07/14/2012   Procedure: SIMPLE MASTECTOMY WITH AXILLARY SENTINEL NODE BIOPSY;  Surgeon: Clovis Pu. Cornett, MD;  Location: [facility];  Service: General;  Laterality: Right;  Bilateral simple mastectomy with port and right sebtibel lymph node mapping   SIMPLE MASTECTOMY WITH AXILLARY SENTINEL NODE BIOPSY  07/14/2012   Procedure: SIMPLE MASTECTOMY;  Surgeon: Clovis Pu. Cornett, MD;  Location: [facility];  Service: General;  Laterality: Left;   TISSUE EXPANDER PLACEMENT Left 09/24/2013   Procedure: PLACEMENT OF TISSUE EXPANDER AND FLEX HD TO LEFT BREAST;  Surgeon: Wayland Denis, DO;  Location: [facility];  Service: Plastics;  Laterality: Left;    SOCIAL HISTORY: Social History   Socioeconomic History   Marital status: Married    Spouse name: Not on file   Number of children: 2   Years of education: Not on file   Highest education level: Not on file  Occupational History    Employer: [employer]  Tobacco Use   Smoking status: Never   Smokeless tobacco: Never  Substance and Sexual Activity   Alcohol use: Yes    Comment: occasional   Drug use: No   Sexual activity: Yes    Birth control/protection: Surgical  Other Topics Concern   Not on file  Social History Narrative   Not on file   Social Drivers of Health   Financial Resource Strain: Not on file  Food Insecurity: Not on file  Transportation Needs: Not on file  Physical Activity: Not on file  Stress: Not on file  Social Connections: Not on file  Intimate Partner Violence: Not on file    FAMILY HISTORY: Family History  Problem Relation Age of Onset   Hypertension Mother    Alcohol abuse Mother    Heart disease Maternal Grandmother    Stroke Maternal Grandfather    Colon cancer Maternal Aunt 55       alive at 2   Brain cancer Maternal Uncle 31       and  lymphoma in early 37s; deceased   Brain cancer Maternal Uncle 60       deceased   Pancreatic cancer Maternal Uncle 45       alive at 23   Breast cancer Cousin 17       mat 1st cousin once removed through mat GF ; deceased   Breast cancer Maternal Aunt        great aunt through mat GF; dx at ? age    Review of Systems  Constitutional:  Negative for appetite change, chills, fatigue, fever and unexpected weight change.  HENT:   Negative for hearing loss, lump/mass and trouble swallowing.   Eyes:  Negative for eye problems and icterus.  Respiratory:  Negative for chest tightness, cough and shortness of breath.   Cardiovascular:  Negative for chest pain, leg swelling and palpitations.  Gastrointestinal:  Negative for abdominal distention, abdominal pain, constipation, diarrhea, nausea and vomiting.  Endocrine: Negative for hot flashes.  Genitourinary:  Negative for  difficulty urinating.   Musculoskeletal:  Negative for arthralgias.  Skin:  Negative for itching and rash.  Neurological:  Negative for dizziness, extremity weakness, headaches and numbness.  Hematological:  Negative for adenopathy. Does not bruise/bleed easily.  Psychiatric/Behavioral:  Negative for depression. The patient is not nervous/anxious.       PHYSICAL EXAMINATION    Vitals:   08/18/23 1419  BP: 128/84  Pulse: (!) 101  Resp: 16  Temp: 98 F (36.7 C)  SpO2: 99%    Physical Exam Constitutional:      General: She is not in acute distress.    Appearance: Normal appearance. She is not toxic-appearing.  HENT:     Head: Normocephalic and atraumatic.  Eyes:     General: No scleral icterus. Cardiovascular:     Rate and Rhythm: Normal rate and regular rhythm.     Pulses: Normal pulses.     Heart sounds: Normal heart sounds.  Pulmonary:     Effort: Pulmonary effort is normal.     Breath sounds: Normal breath sounds.  Abdominal:     General: Abdomen is flat. Bowel sounds are normal. There is no  distension.     Palpations: Abdomen is soft.     Tenderness: There is no abdominal tenderness.  Musculoskeletal:        General: No swelling.     Cervical back: Neck supple.  Lymphadenopathy:     Cervical: No cervical adenopathy.  Skin:    General: Skin is warm and dry.     Findings: No rash.  Neurological:     General: No focal deficit present.     Mental Status: She is alert.  Psychiatric:        Mood and Affect: Mood normal.        Behavior: Behavior normal.     LABORATORY DATA:  CBC    Component Value Date/Time   WBC 7.3 08/18/2023 1359   WBC 5.4 09/13/2022 1656   RBC 4.90 08/18/2023 1359   HGB 14.6 08/18/2023 1359   HGB 15.9 10/30/2019 1030   HGB 14.7 02/15/2017 0852   HCT 41.7 08/18/2023 1359   HCT 46.1 10/30/2019 1030   HCT 42.9 02/15/2017 0852   PLT 325 08/18/2023 1359   PLT 245 10/30/2019 1030   MCV 85.1 08/18/2023 1359   MCV 88 10/30/2019 1030   MCV 86.7 02/15/2017 0852   MCH 29.8 08/18/2023 1359   MCHC 35.0 08/18/2023 1359   RDW 13.2 08/18/2023 1359   RDW 12.5 10/30/2019 1030   RDW 12.6 02/15/2017 0852   LYMPHSABS 2.4 08/18/2023 1359   LYMPHSABS 1.4 02/15/2017 0852   MONOABS 0.5 08/18/2023 1359   MONOABS 0.3 02/15/2017 0852   EOSABS 0.2 08/18/2023 1359   EOSABS 0.1 02/15/2017 0852   BASOSABS 0.0 08/18/2023 1359   BASOSABS 0.0 02/15/2017 0852    CMP     Component Value Date/Time   NA 142 08/18/2023 1359   NA 140 10/30/2019 1030   NA 141 02/15/2017 0852   K 3.2 (L) 08/18/2023 1359   K 3.9 02/15/2017 0852   CL 106 08/18/2023 1359   CL 101 11/15/2012 0904   CO2 28 08/18/2023 1359   CO2 25 02/15/2017 0852   GLUCOSE 84 08/18/2023 1359   GLUCOSE 87 02/15/2017 0852   GLUCOSE 69 (L) 11/15/2012 0904   BUN 12 08/18/2023 1359   BUN 11 10/30/2019 1030   BUN 12.5 02/15/2017 0852   CREATININE 0.58 08/18/2023 1359   CREATININE 0.9 02/15/2017  4098   CALCIUM 8.7 (L) 08/18/2023 1359   CALCIUM 9.3 02/15/2017 0852   PROT 7.1 08/18/2023 1359   PROT  6.7 10/30/2019 1030   PROT 7.0 02/15/2017 0852   ALBUMIN 4.4 08/18/2023 1359   ALBUMIN 4.2 10/30/2019 1030   ALBUMIN 3.6 02/15/2017 0852   AST 16 08/18/2023 1359   AST 16 02/15/2017 0852   ALT 11 08/18/2023 1359   ALT 13 02/15/2017 0852   ALKPHOS 48 08/18/2023 1359   ALKPHOS 63 02/15/2017 0852   BILITOT 0.3 08/18/2023 1359   BILITOT 0.69 02/15/2017 0852   GFRNONAA >60 08/18/2023 1359   GFRAA 100 10/30/2019 1030       ASSESSMENT and THERAPY PLAN:   Malignant neoplasm of upper-inner quadrant of right breast in female, estrogen receptor positive (HCC) Sherry Barry is a 53 year old woman with stage IV triple positive breast cancer s/p first line THP and then on HP maintenance now with progression andis here to initiate second line with Enhertu. She is here before planned 2 nd cycle of Enhertu.  Cancer treatment follow-up Undergoing chemotherapy with manageable side effects. Blood work favorable, no severe adverse effects. Improvement in cough noted. - Proceed with current chemotherapy regimen without dose adjustment. - Schedule one more chemotherapy session before re-evaluation with imaging. - Advise her to call if there are any changes in symptoms.  Chemotherapy-induced nausea and constipation Mild to moderate nausea and constipation due to treatment. Nausea managed with antiemetics. Constipation addressed with Miralax, leading to diarrhea. No vomiting reported. - Continue current antiemetic regimen to manage nausea. - Monitor bowel movements and adjust laxative use as needed.  Dental care during chemotherapy Dental cleaning scheduled, safe during chemotherapy if no invasive procedures are performed. - Proceed with dental cleaning as scheduled.    All questions were answered. The patient knows to call the clinic with any problems, questions or concerns. We can certainly see the patient much sooner if necessary.  Total encounter time:20 minutes*in face-to-face visit time, chart  review, lab review, care coordination, order entry, and documentation of the encounter time.  *Total Encounter Time as defined by the Centers for Medicare and Medicaid Services includes, in addition to the face-to-face time of a patient visit (documented in the note above) non-face-to-face time: obtaining and reviewing outside history, ordering and reviewing medications, tests or procedures, care coordination (communications with other health care professionals or caregivers) and documentation in the medical record.

## 2023-08-18 NOTE — Assessment & Plan Note (Signed)
 Sherry Barry is a 53 year old woman with stage IV triple positive breast cancer s/p first line THP and then on HP maintenance now with progression andis here to initiate second line with Enhertu. She is here before planned 2 nd cycle of Enhertu.  Cancer treatment follow-up Undergoing chemotherapy with manageable side effects. Blood work favorable, no severe adverse effects. Improvement in cough noted. - Proceed with current chemotherapy regimen without dose adjustment. - Schedule one more chemotherapy session before re-evaluation with imaging. - Advise her to call if there are any changes in symptoms.  Chemotherapy-induced nausea and constipation Mild to moderate nausea and constipation due to treatment. Nausea managed with antiemetics. Constipation addressed with Miralax, leading to diarrhea. No vomiting reported. - Continue current antiemetic regimen to manage nausea. - Monitor bowel movements and adjust laxative use as needed.  Dental care during chemotherapy Dental cleaning scheduled, safe during chemotherapy if no invasive procedures are performed. - Proceed with dental cleaning as scheduled.

## 2023-08-18 NOTE — Patient Instructions (Signed)

## 2023-08-23 ENCOUNTER — Encounter: Payer: Self-pay | Admitting: Hematology and Oncology

## 2023-09-01 ENCOUNTER — Other Ambulatory Visit: Payer: Self-pay | Admitting: Hematology and Oncology

## 2023-09-01 DIAGNOSIS — Z170 Estrogen receptor positive status [ER+]: Secondary | ICD-10-CM

## 2023-09-01 DIAGNOSIS — C50211 Malignant neoplasm of upper-inner quadrant of right female breast: Secondary | ICD-10-CM

## 2023-09-02 ENCOUNTER — Inpatient Hospital Stay: Payer: Federal, State, Local not specified - PPO

## 2023-09-02 ENCOUNTER — Inpatient Hospital Stay: Payer: Federal, State, Local not specified - PPO | Admitting: Hematology and Oncology

## 2023-09-05 ENCOUNTER — Encounter: Payer: Self-pay | Admitting: Hematology and Oncology

## 2023-09-07 MED FILL — Fosaprepitant Dimeglumine For IV Infusion 150 MG (Base Eq): INTRAVENOUS | Qty: 5 | Status: AC

## 2023-09-08 ENCOUNTER — Inpatient Hospital Stay: Payer: Federal, State, Local not specified - PPO

## 2023-09-08 ENCOUNTER — Inpatient Hospital Stay (HOSPITAL_BASED_OUTPATIENT_CLINIC_OR_DEPARTMENT_OTHER): Payer: Federal, State, Local not specified - PPO | Admitting: Hematology and Oncology

## 2023-09-08 VITALS — BP 121/86 | HR 113 | Temp 98.1°F | Resp 17 | Wt 144.3 lb

## 2023-09-08 VITALS — HR 96 | Resp 18

## 2023-09-08 DIAGNOSIS — Z5112 Encounter for antineoplastic immunotherapy: Secondary | ICD-10-CM | POA: Diagnosis not present

## 2023-09-08 DIAGNOSIS — C7951 Secondary malignant neoplasm of bone: Secondary | ICD-10-CM | POA: Diagnosis not present

## 2023-09-08 DIAGNOSIS — Z9013 Acquired absence of bilateral breasts and nipples: Secondary | ICD-10-CM | POA: Diagnosis not present

## 2023-09-08 DIAGNOSIS — Z170 Estrogen receptor positive status [ER+]: Secondary | ICD-10-CM

## 2023-09-08 DIAGNOSIS — Z9221 Personal history of antineoplastic chemotherapy: Secondary | ICD-10-CM | POA: Diagnosis not present

## 2023-09-08 DIAGNOSIS — Z1732 Human epidermal growth factor receptor 2 negative status: Secondary | ICD-10-CM | POA: Diagnosis not present

## 2023-09-08 DIAGNOSIS — Z95828 Presence of other vascular implants and grafts: Secondary | ICD-10-CM

## 2023-09-08 DIAGNOSIS — C50211 Malignant neoplasm of upper-inner quadrant of right female breast: Secondary | ICD-10-CM | POA: Diagnosis not present

## 2023-09-08 DIAGNOSIS — Z1721 Progesterone receptor positive status: Secondary | ICD-10-CM | POA: Diagnosis not present

## 2023-09-08 DIAGNOSIS — Z923 Personal history of irradiation: Secondary | ICD-10-CM | POA: Diagnosis not present

## 2023-09-08 LAB — CBC WITH DIFFERENTIAL (CANCER CENTER ONLY)
Abs Immature Granulocytes: 0.05 10*3/uL (ref 0.00–0.07)
Basophils Absolute: 0.1 10*3/uL (ref 0.0–0.1)
Basophils Relative: 1 %
Eosinophils Absolute: 0.2 10*3/uL (ref 0.0–0.5)
Eosinophils Relative: 4 %
HCT: 39.4 % (ref 36.0–46.0)
Hemoglobin: 13.8 g/dL (ref 12.0–15.0)
Immature Granulocytes: 1 %
Lymphocytes Relative: 42 %
Lymphs Abs: 2.5 10*3/uL (ref 0.7–4.0)
MCH: 29.5 pg (ref 26.0–34.0)
MCHC: 35 g/dL (ref 30.0–36.0)
MCV: 84.2 fL (ref 80.0–100.0)
Monocytes Absolute: 0.6 10*3/uL (ref 0.1–1.0)
Monocytes Relative: 9 %
Neutro Abs: 2.5 10*3/uL (ref 1.7–7.7)
Neutrophils Relative %: 43 %
Platelet Count: 384 10*3/uL (ref 150–400)
RBC: 4.68 MIL/uL (ref 3.87–5.11)
RDW: 13.8 % (ref 11.5–15.5)
WBC Count: 5.9 10*3/uL (ref 4.0–10.5)
nRBC: 0 % (ref 0.0–0.2)

## 2023-09-08 LAB — CMP (CANCER CENTER ONLY)
ALT: 13 U/L (ref 0–44)
AST: 16 U/L (ref 15–41)
Albumin: 4.2 g/dL (ref 3.5–5.0)
Alkaline Phosphatase: 49 U/L (ref 38–126)
Anion gap: 6 (ref 5–15)
BUN: 13 mg/dL (ref 6–20)
CO2: 28 mmol/L (ref 22–32)
Calcium: 9.2 mg/dL (ref 8.9–10.3)
Chloride: 108 mmol/L (ref 98–111)
Creatinine: 0.64 mg/dL (ref 0.44–1.00)
GFR, Estimated: >60 mL/min (ref >60)
Glucose, Bld: 86 mg/dL (ref 70–99)
Potassium: 3.5 mmol/L (ref 3.5–5.1)
Sodium: 142 mmol/L (ref 135–145)
Total Bilirubin: 0.3 mg/dL (ref 0.0–1.2)
Total Protein: 6.9 g/dL (ref 6.5–8.1)

## 2023-09-08 MED ORDER — OLANZAPINE 2.5 MG PO TABS: 2.5000 mg | ORAL_TABLET | Freq: Every day | ORAL | 1 refills | Status: DC

## 2023-09-08 MED ORDER — DIPHENHYDRAMINE HCL 25 MG PO CAPS
25.0000 mg | ORAL_CAPSULE | Freq: Once | ORAL | Status: AC
  Administered 2023-09-08: 25 mg via ORAL
  Filled 2023-09-08: qty 1

## 2023-09-08 MED ORDER — DEXTROSE 5 % IV SOLN: INTRAVENOUS | Status: DC

## 2023-09-08 MED ORDER — DEXAMETHASONE SODIUM PHOSPHATE 10 MG/ML IJ SOLN
10.0000 mg | Freq: Once | INTRAMUSCULAR | Status: AC
Start: 2023-09-08 — End: 2023-09-08
  Administered 2023-09-08: 10 mg via INTRAVENOUS
  Filled 2023-09-08: qty 1

## 2023-09-08 MED ORDER — PALONOSETRON HCL INJECTION 0.25 MG/5ML
0.2500 mg | Freq: Once | INTRAVENOUS | Status: AC
  Administered 2023-09-08: 0.25 mg via INTRAVENOUS
  Filled 2023-09-08: qty 5

## 2023-09-08 MED ORDER — ACETAMINOPHEN 325 MG PO TABS
650.0000 mg | ORAL_TABLET | Freq: Once | ORAL | Status: AC
Start: 2023-09-08 — End: 2023-09-08
  Administered 2023-09-08: 650 mg via ORAL
  Filled 2023-09-08: qty 2

## 2023-09-08 MED ORDER — SODIUM CHLORIDE 0.9% FLUSH
10.0000 mL | INTRAVENOUS | Status: DC | PRN
  Administered 2023-09-08: 10 mL

## 2023-09-08 MED ORDER — FAM-TRASTUZUMAB DERUXTECAN-NXKI CHEMO 100 MG IV SOLR
5.4000 mg/kg | Freq: Once | INTRAVENOUS | Status: AC
  Administered 2023-09-08: 340 mg via INTRAVENOUS
  Filled 2023-09-08: qty 17

## 2023-09-08 MED ORDER — SODIUM CHLORIDE 0.9% FLUSH
10.0000 mL | INTRAVENOUS | Status: DC | PRN
Start: 2023-09-08 — End: 2023-09-08
  Administered 2023-09-08: 10 mL via INTRAVENOUS

## 2023-09-08 MED ORDER — SODIUM CHLORIDE 0.9 % IV SOLN
150.0000 mg | Freq: Once | INTRAVENOUS | Status: AC
  Administered 2023-09-08: 150 mg via INTRAVENOUS
  Filled 2023-09-08: qty 150

## 2023-09-08 MED ORDER — HEPARIN SOD (PORK) LOCK FLUSH 100 UNIT/ML IV SOLN
500.0000 [IU] | Freq: Once | INTRAVENOUS | Status: AC | PRN
  Administered 2023-09-08: 500 [IU]

## 2023-09-08 NOTE — Assessment & Plan Note (Signed)
 Sherry Barry is a 53 year old woman with stage IV triple positive breast cancer s/p first line THP and then on HP maintenance now with progression andis here to initiate second line with Enhertu. She is here before planned 3rd cycle of Enhertu. She is clinically improving, cough has significantly improved. We will repeat imaging before planned c4 of enhertu  Nausea Current antiemetic regimen inadequate. Proposed Zyprexa for nausea and sleep improvement. - Prescribe Zyprexa 2.5 mg at night for nausea and sleep. - Instruct not to take potassium chloride with Zyprexa due to ulcer risk.  Sinus tachycardia Asymptomatic. Cardiologist consultation needed for beta-blocker consideration. - Send message to Dr. Shirlee Latch to discuss beta-blocker initiation.  Rachel Moulds MD

## 2023-09-08 NOTE — Progress Notes (Signed)
 [facility] Cancer Follow up:    Sherry Bur, MD [address] [place] [address]   DIAGNOSIS:  Cancer Staging  Malignant neoplasm of upper-inner quadrant of right breast in female, estrogen receptor positive (HCC) Staging form: Breast, AJCC 7th Edition - Pathologic stage from 07/14/2012: Stage IV (TX, NX, M1) - Signed by Rachel Moulds, MD on 08/28/2021 Specimen type: Core Needle Biopsy Histopathologic type: 9931 Laterality: Right   SUMMARY OF ONCOLOGIC HISTORY: Oncology History  Malignant neoplasm of upper-inner quadrant of right breast in female, estrogen receptor positive (HCC)  07/13/2012 Clinical Stage   Stage IA: T1c N0   07/14/2012 Definitive Surgery   Bilateral mastectomy/right SLNB: RIGHT invasive ductal carcinoma, grade 3, ER+, PR +, Her2/neu positive (ratio 3.02), Ki67 48%. DCIS. 1/4 LN positive for malignancy. LEFT: benign   07/14/2012 Pathologic Stage   Stage IIB: T2 N1a M0   07/14/2012 Cancer Staging   Staging form: Breast, AJCC 7th Edition - Pathologic stage from 07/14/2012: Stage IV (TX, NX, M1) - Signed by Rachel Moulds, MD on 08/28/2021 Specimen type: Core Needle Biopsy Histopathologic type: 9931 Laterality: Right   08/17/2012 - 08/30/2013 Chemotherapy   Adjuvant carboplatin, docetaxel, and trastuzumab x 6 cycles (completed 11/30/2012) followed by maintenance trastuzumab to total one year   11/2012 Procedure   Comp Cancer Gene panel (GeneDx) negative for deleterious mutations    - 02/2013 Radiation Therapy   Adjuvant RT to right breast   02/2013 -  Anti-estrogen oral therapy   Tamoxifen 20 mg daily   09/24/2013 Surgery   Bilateral breast reconstruction with latissmus flap and expander placement   12/12/2013 Surgery   Implant placement    Relapse/Recurrence   Left sided malignant pleural effusion.  Tumor cells are positive for GATA3 and ER, negative for TTF-1 consistent with recurrent breast carcinoma Prognostic showed ER 90%  positive strong staining, PR 80% positive strong staining, HER2 negative.    09/16/2021 Imaging   PET scan showed malignant left pleural effusion with extensive areas of nodularity and hypermetabolic activity involving the mediastinal border and pericardium as well as the most inferior aspects of the costodiaphragmatic recess.  Signs of nodal disease at the thoracic inlet on the left suspect extension of disease below the diaphragm along the left anterolateral aorta.  Nonspecific moderate to markedly increased metabolic activity about the base of the tongue bilaterally.  Consider direct visualization showing mildly asymmetric uptake favoring the right lingual tonsil.  Sclerotic foci without marked increased metabolic activity suspicious for metastatic disease perhaps treated or questions.  Heterogeneous marrow uptake seen elsewhere generalized and nonspecific.  Consider spinal MRI is warranted  MRI brain without any evidence of intracranial metastatic disease.   10/09/2021 - 02/11/2022 Chemotherapy   Taxotere, Herceptin, Perjeta x 6   12/04/2021 Imaging   There is no evidence new metastatic disease. There is interval decrease in the left pleural effusion possibly suggesting resolving pleural metastatic disease. Few scattered sclerotic metastatic lesions in the skeletal structures have not changed significantly.   No acute findings are seen in the chest abdomen and pelvis.     02/22/2022 -  Antibody Plan   Herceptin/Perjeta every 3 weeks   03/21/2022 Imaging   CT chest abdomen pelvis  IMPRESSION: 1. No significant change since previous study. Again demonstrated is a chronic small left pleural effusion with basilar atelectasis and scattered sclerotic skeletal metastases. 2. No acute abnormalities are demonstrated. 3. Small esophageal hiatal hernia.     Electronically Signed   By:  Burman Nieves M.D.   On: 03/21/2022 20:23   07/22/2023 -  Chemotherapy   Patient is on Treatment Plan :  BREAST Fam-Trastuzumab Deruxtecan-nxki (Enhertu) (5.4) q21d       CURRENT THERAPY:Herceptin/Perjeta; tamoxifen  INTERVAL HISTORY:  Discussed the use of AI scribe software for clinical note transcription with the patient, who gave verbal consent to proceed.  History of Present Illness        Sherry Barry is a 53 year old female who presents for follow-up while on Enhertu  Sherry Barry is a 53 year old female who presents with nausea and fatigue.  She experiences persistent nausea despite alternating between Zofran and Compazine, which do not provide significant relief.  She has fatigue, though the severity and impact on daily activities is minimal. No diarrhea is present.  Her cough is improving, and she has retained her hair during treatment, unlike others on the same medication.   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  MEDICAL HISTORY: Past Medical History:  Diagnosis Date   Allergy    Breast cancer (HCC)    Breast cancer (HCC)    right   History of chemotherapy    doxetaxel/carboplatin/trastuzumab   History of migraine    last one about a week ago   Hx of radiation therapy 01/01/13- 02/15/13   r chest wall, r  supraclav/axilla 5040 cGy/28 sessions, scar boost 1000 cGy/5 sessions   Hypertension    no meds,   urgent care on pomana   Migraine    Migraine     SURGICAL HISTORY: Past Surgical History:  Procedure Laterality Date   ABDOMINAL HYSTERECTOMY     no salpingo-oophorectomy 2009   BREAST RECONSTRUCTION WITH PLACEMENT OF TISSUE EXPANDER AND FLEX HD (ACELLULAR HYDRATED DERMIS) Left 09/24/2013   IR IMAGING GUIDED PORT INSERTION  10/07/2021   IR THORACENTESIS ASP PLEURAL SPACE W/IMG GUIDE  08/11/2021   IR THORACENTESIS ASP PLEURAL SPACE W/IMG GUIDE  10/07/2021   LATISSIMUS FLAP TO BREAST Right 09/24/2013   Procedure: RIGHT LATISSMUS MYOCUTAEIOUS MUSCLE FLAP AND PLACEMENT OF TISSUE Lurena Nida;  Surgeon: Wayland Denis, DO;  Location: [facility];  Service: Plastics;  Laterality: Right;   LIPOSUCTION WITH LIPOFILLING Bilateral 12/12/2013   Procedure: LIPOSUCTION WITH LIPOFILLING;  Surgeon: Wayland Denis, DO;  Location: [facility];  Service: Plastics;  Laterality: Bilateral;   PORT-A-CATH REMOVAL Left 12/12/2013   Procedure: REMOVAL PORT-A-CATH;  Surgeon: Wayland Denis, DO;  Location: [facility];  Service: Plastics;  Laterality: Left;   PORTACATH PLACEMENT  07/14/2012   Procedure: INSERTION PORT-A-CATH;  Surgeon: Clovis Pu. Cornett, MD;  Location: [facility];  Service: General;  Laterality: Left;   RECONSTRUCTION BREAST W/ LATISSIMUS DORSI FLAP Right 09/24/2013   "& tissue expander placement"   REMOVAL OF BILATERAL TISSUE EXPANDERS WITH PLACEMENT OF BILATERAL BREAST  IMPLANTS Bilateral 12/12/2013   Procedure: REMOVAL OF BILATERAL TISSUE EXPANDERS WITH PLACEMENT OF BILATERAL BREAST IMPLANTS/BILATERAL CAPSULECTOMIES WITH  LIPOFILLING FAT GRAFTING;  Surgeon: Wayland Denis, DO;  Location: [facility];  Service: Plastics;  Laterality: Bilateral;   SIMPLE MASTECTOMY WITH AXILLARY SENTINEL NODE BIOPSY  07/14/2012   Procedure: SIMPLE MASTECTOMY WITH AXILLARY SENTINEL NODE BIOPSY;  Surgeon:  Clovis Pu. Cornett, MD;  Location: [facility];  Service: General;  Laterality: Right;  Bilateral simple mastectomy with port and right sebtibel lymph node mapping   SIMPLE MASTECTOMY WITH AXILLARY SENTINEL NODE BIOPSY  07/14/2012   Procedure: SIMPLE MASTECTOMY;  Surgeon: Clovis Pu. Cornett, MD;  Location: [facility];  Service: General;  Laterality: Left;   TISSUE EXPANDER PLACEMENT Left 09/24/2013   Procedure: PLACEMENT OF TISSUE EXPANDER AND FLEX HD TO LEFT BREAST;  Surgeon: Wayland Denis, DO;  Location: [facility];  Service: Plastics;  Laterality: Left;    SOCIAL HISTORY: Social History   Socioeconomic History   Marital status: Married    Spouse name: Not on file   Number of children: 2   Years of education: Not on file   Highest education level: Not on file  Occupational History    Employer: [employer]  Tobacco Use   Smoking status: Never   Smokeless tobacco: Never  Substance and Sexual Activity   Alcohol use: Yes    Comment: occasional   Drug use: No   Sexual activity: Yes    Birth control/protection: Surgical  Other Topics Concern   Not on file  Social History Narrative   Not on file   Social Drivers of Health   Financial Resource Strain: Not on file  Food Insecurity: Not on file  Transportation Needs: Not on file  Physical Activity: Not on file  Stress: Not on file  Social Connections: Not on file  Intimate Partner Violence: Not on file    FAMILY HISTORY: Family History  Problem Relation Age of Onset   Hypertension Mother    Alcohol abuse Mother    Heart disease Maternal Grandmother    Stroke Maternal Grandfather    Colon cancer Maternal Aunt 55       alive at 104   Brain cancer Maternal Uncle 13       and lymphoma in early 70s; deceased   Brain cancer Maternal Uncle 60       deceased   Pancreatic cancer Maternal Uncle 45       alive at 60   Breast cancer Cousin 86       mat 1st cousin once removed through mat GF ; deceased   Breast cancer Maternal Aunt        great  aunt through mat GF; dx at ? age    Review of Systems  Constitutional:  Negative for appetite change, chills, fatigue, fever and unexpected weight change.  HENT:   Negative for hearing loss, lump/mass and trouble swallowing.   Eyes:  Negative for eye problems and icterus.  Respiratory:  Negative for chest tightness, cough and shortness of breath.   Cardiovascular:  Negative for chest pain, leg swelling and palpitations.  Gastrointestinal:  Negative for abdominal distention, abdominal pain, constipation, diarrhea, nausea and vomiting.  Endocrine: Negative for hot flashes.  Genitourinary:  Negative for difficulty urinating.   Musculoskeletal:  Negative for arthralgias.  Skin:  Negative for itching and rash.  Neurological:  Negative for dizziness, extremity weakness, headaches and numbness.  Hematological:  Negative for adenopathy. Does not bruise/bleed  easily.  Psychiatric/Behavioral:  Negative for depression. The patient is not nervous/anxious.       PHYSICAL EXAMINATION    Vitals:   09/08/23 1412  BP: 121/86  Pulse: (!) 113  Resp: 17  Temp: 98.1 F (36.7 C)  SpO2: 100%     Physical Exam Constitutional:      General: She is not in acute distress.    Appearance: Normal appearance. She is not toxic-appearing.  HENT:     Head: Normocephalic and atraumatic.  Eyes:     General: No scleral icterus. Cardiovascular:     Rate and Rhythm: Normal rate and regular rhythm.     Pulses: Normal pulses.     Heart sounds: Normal heart sounds.  Pulmonary:     Effort: Pulmonary effort is normal.     Breath sounds: Normal breath sounds.  Abdominal:     General: Abdomen is flat. Bowel sounds are normal. There is no distension.     Palpations: Abdomen is soft.     Tenderness: There is no abdominal tenderness.  Musculoskeletal:        General: No swelling.     Cervical back: Neck supple.  Lymphadenopathy:     Cervical: No cervical adenopathy.  Skin:    General: Skin is warm and  dry.     Findings: No rash.  Neurological:     General: No focal deficit present.     Mental Status: She is alert.  Psychiatric:        Mood and Affect: Mood normal.        Behavior: Behavior normal.     LABORATORY DATA:  CBC    Component Value Date/Time   WBC 5.9 09/08/2023 1358   WBC 5.4 09/13/2022 1656   RBC 4.68 09/08/2023 1358   HGB 13.8 09/08/2023 1358   HGB 15.9 10/30/2019 1030   HGB 14.7 02/15/2017 0852   HCT 39.4 09/08/2023 1358   HCT 46.1 10/30/2019 1030   HCT 42.9 02/15/2017 0852   PLT 384 09/08/2023 1358   PLT 245 10/30/2019 1030   MCV 84.2 09/08/2023 1358   MCV 88 10/30/2019 1030   MCV 86.7 02/15/2017 0852   MCH 29.5 09/08/2023 1358   MCHC 35.0 09/08/2023 1358   RDW 13.8 09/08/2023 1358   RDW 12.5 10/30/2019 1030   RDW 12.6 02/15/2017 0852   LYMPHSABS 2.5 09/08/2023 1358   LYMPHSABS 1.4 02/15/2017 0852   MONOABS 0.6 09/08/2023 1358   MONOABS 0.3 02/15/2017 0852   EOSABS 0.2 09/08/2023 1358   EOSABS 0.1 02/15/2017 0852   BASOSABS 0.1 09/08/2023 1358   BASOSABS 0.0 02/15/2017 0852    CMP     Component Value Date/Time   NA 142 09/08/2023 1358   NA 140 10/30/2019 1030   NA 141 02/15/2017 0852   K 3.5 09/08/2023 1358   K 3.9 02/15/2017 0852   CL 108 09/08/2023 1358   CL 101 11/15/2012 0904   CO2 28 09/08/2023 1358   CO2 25 02/15/2017 0852   GLUCOSE 86 09/08/2023 1358   GLUCOSE 87 02/15/2017 0852   GLUCOSE 69 (L) 11/15/2012 0904   BUN 13 09/08/2023 1358   BUN 11 10/30/2019 1030   BUN 12.5 02/15/2017 0852   CREATININE 0.64 09/08/2023 1358   CREATININE 0.9 02/15/2017 0852   CALCIUM 9.2 09/08/2023 1358   CALCIUM 9.3 02/15/2017 0852   PROT 6.9 09/08/2023 1358   PROT 6.7 10/30/2019 1030   PROT 7.0 02/15/2017 0852   ALBUMIN 4.2 09/08/2023 1358  ALBUMIN 4.2 10/30/2019 1030   ALBUMIN 3.6 02/15/2017 0852   AST 16 09/08/2023 1358   AST 16 02/15/2017 0852   ALT 13 09/08/2023 1358   ALT 13 02/15/2017 0852   ALKPHOS 49 09/08/2023 1358   ALKPHOS  63 02/15/2017 0852   BILITOT 0.3 09/08/2023 1358   BILITOT 0.69 02/15/2017 0852   GFRNONAA >60 09/08/2023 1358   GFRAA 100 10/30/2019 1030       ASSESSMENT and THERAPY PLAN:   Malignant neoplasm of upper-inner quadrant of right breast in female, estrogen receptor positive (HCC) Sherry Barry is a 53 year old woman with stage IV triple positive breast cancer s/p first line THP and then on HP maintenance now with progression andis here to initiate second line with Enhertu. She is here before planned 3rd cycle of Enhertu. She is clinically improving, cough has significantly improved. We will repeat imaging before planned c4 of enhertu  Nausea Current antiemetic regimen inadequate. Proposed Zyprexa for nausea and sleep improvement. - Prescribe Zyprexa 2.5 mg at night for nausea and sleep. - Instruct not to take potassium chloride with Zyprexa due to ulcer risk.  Sinus tachycardia Asymptomatic. Cardiologist consultation needed for beta-blocker consideration. - Send message to Dr. Shirlee Latch to discuss beta-blocker initiation.  Rachel Moulds MD         All questions were answered. The patient knows to call the clinic with any problems, questions or concerns. We can certainly see the patient much sooner if necessary.  Total encounter time:20 minutes*in face-to-face visit time, chart review, lab review, care coordination, order entry, and documentation of the encounter time.  *Total Encounter Time as defined by the Centers for Medicare and Medicaid Services includes, in addition to the face-to-face time of a patient visit (documented in the note above) non-face-to-face time: obtaining and reviewing outside history, ordering and reviewing medications, tests or procedures, care coordination (communications with other health care professionals or caregivers) and documentation in the medical record.

## 2023-09-08 NOTE — Patient Instructions (Signed)

## 2023-09-08 NOTE — Patient Instructions (Signed)

## 2023-09-12 ENCOUNTER — Telehealth: Payer: Self-pay | Admitting: *Deleted

## 2023-09-12 ENCOUNTER — Other Ambulatory Visit: Payer: Self-pay | Admitting: Hematology and Oncology

## 2023-09-12 MED ORDER — CARVEDILOL 3.125 MG PO TABS: 3.1250 mg | ORAL_TABLET | Freq: Two times a day (BID) | ORAL | 1 refills | Status: DC

## 2023-09-12 NOTE — Telephone Encounter (Addendum)
 This RN called pt - reviewed with pt in agreement to plan  ----- Message from [facility] sent at 09/12/2023  9:34 AM EDT ----- Val  Can u let the pt know.  Thanks ----- Message ----- From: Laurey Morale, MD Sent: 09/08/2023   2:27 PM EDT To: Rachel Moulds, MD  Ok to take Coreg 3.125 mg bid. ----- Message ----- From: Rachel Moulds, MD Sent: 09/08/2023   2:24 PM EDT To: Laurey Morale, MD  Dr Shirlee Latch  We started Sherry Barry on [place] and she is a bit sinus tachycardic, asymptomatic. Would you may be suggest a small dose of beta blocker, her BP is normal  Thanks

## 2023-09-12 NOTE — Progress Notes (Signed)
 Coreg prescribed per Dr Alford Highland recommendations.

## 2023-09-13 ENCOUNTER — Other Ambulatory Visit: Payer: Self-pay

## 2023-09-22 ENCOUNTER — Ambulatory Visit (HOSPITAL_COMMUNITY)
Admission: RE | Admit: 2023-09-22 | Discharge: 2023-09-22 | Disposition: A | Discharge status: Home / Self Care | Source: Ambulatory Visit | Attending: Hematology and Oncology | Admitting: Hematology and Oncology

## 2023-09-22 DIAGNOSIS — C50211 Malignant neoplasm of upper-inner quadrant of right female breast: Secondary | ICD-10-CM | POA: Diagnosis not present

## 2023-09-22 DIAGNOSIS — Z170 Estrogen receptor positive status [ER+]: Secondary | ICD-10-CM | POA: Diagnosis not present

## 2023-09-22 DIAGNOSIS — K5730 Diverticulosis of large intestine without perforation or abscess without bleeding: Secondary | ICD-10-CM | POA: Diagnosis not present

## 2023-09-22 DIAGNOSIS — K7689 Other specified diseases of liver: Secondary | ICD-10-CM | POA: Diagnosis not present

## 2023-09-22 DIAGNOSIS — J90 Pleural effusion, not elsewhere classified: Secondary | ICD-10-CM | POA: Diagnosis not present

## 2023-09-22 DIAGNOSIS — C7951 Secondary malignant neoplasm of bone: Secondary | ICD-10-CM | POA: Diagnosis not present

## 2023-09-22 MED ORDER — IOHEXOL 300 MG/ML  SOLN
100.0000 mL | Freq: Once | INTRAMUSCULAR | Status: AC | PRN
  Administered 2023-09-22: 100 mL via INTRAVENOUS

## 2023-09-22 MED ORDER — SODIUM CHLORIDE (PF) 0.9 % IJ SOLN
INTRAMUSCULAR | Status: AC
  Filled 2023-09-22: qty 50

## 2023-09-25 ENCOUNTER — Other Ambulatory Visit: Payer: Self-pay

## 2023-09-25 ENCOUNTER — Emergency Department (HOSPITAL_COMMUNITY)
Admission: EM | Admit: 2023-09-25 | Discharge: 2023-09-26 | Disposition: A | Discharge status: Against Medical Advice | Attending: Emergency Medicine | Admitting: Emergency Medicine

## 2023-09-25 DIAGNOSIS — W268XXA Contact with other sharp object(s), not elsewhere classified, initial encounter: Secondary | ICD-10-CM | POA: Diagnosis not present

## 2023-09-25 DIAGNOSIS — S6992XA Unspecified injury of left wrist, hand and finger(s), initial encounter: Secondary | ICD-10-CM | POA: Diagnosis not present

## 2023-09-25 DIAGNOSIS — Z5321 Procedure and treatment not carried out due to patient leaving prior to being seen by health care provider: Secondary | ICD-10-CM | POA: Diagnosis not present

## 2023-09-25 DIAGNOSIS — Y93G3 Activity, cooking and baking: Secondary | ICD-10-CM | POA: Diagnosis not present

## 2023-09-25 NOTE — ED Triage Notes (Signed)
 Pt was cutting up onions and accidentally cut off a small part of left thumb. Pt states it has been bleeding for about 2 hours. New guaze dressing applied. Pt is currently on chemo.

## 2023-09-26 NOTE — ED Notes (Signed)
 Pt stated since her finger was not bleeding anymore she was leaving. Pt notified NT.

## 2023-09-28 MED FILL — Fosaprepitant Dimeglumine For IV Infusion 150 MG (Base Eq): INTRAVENOUS | Qty: 5 | Status: AC

## 2023-09-29 ENCOUNTER — Inpatient Hospital Stay: Admitting: Hematology and Oncology

## 2023-09-29 ENCOUNTER — Inpatient Hospital Stay: Payer: Federal, State, Local not specified - PPO

## 2023-09-29 ENCOUNTER — Inpatient Hospital Stay

## 2023-09-29 ENCOUNTER — Inpatient Hospital Stay: Attending: Hematology and Oncology

## 2023-09-29 VITALS — BP 104/78 | HR 95 | Resp 14

## 2023-09-29 VITALS — BP 119/89 | HR 99 | Temp 98.2°F | Resp 16 | Wt 145.2 lb

## 2023-09-29 DIAGNOSIS — C50211 Malignant neoplasm of upper-inner quadrant of right female breast: Secondary | ICD-10-CM | POA: Diagnosis not present

## 2023-09-29 DIAGNOSIS — Z9221 Personal history of antineoplastic chemotherapy: Secondary | ICD-10-CM | POA: Insufficient documentation

## 2023-09-29 DIAGNOSIS — Z1731 Human epidermal growth factor receptor 2 positive status: Secondary | ICD-10-CM | POA: Diagnosis not present

## 2023-09-29 DIAGNOSIS — Z79810 Long term (current) use of selective estrogen receptor modulators (SERMs): Secondary | ICD-10-CM | POA: Diagnosis not present

## 2023-09-29 DIAGNOSIS — Z923 Personal history of irradiation: Secondary | ICD-10-CM | POA: Insufficient documentation

## 2023-09-29 DIAGNOSIS — C7951 Secondary malignant neoplasm of bone: Secondary | ICD-10-CM | POA: Diagnosis not present

## 2023-09-29 DIAGNOSIS — Z5112 Encounter for antineoplastic immunotherapy: Secondary | ICD-10-CM | POA: Diagnosis not present

## 2023-09-29 DIAGNOSIS — Z1721 Progesterone receptor positive status: Secondary | ICD-10-CM | POA: Insufficient documentation

## 2023-09-29 DIAGNOSIS — Z170 Estrogen receptor positive status [ER+]: Secondary | ICD-10-CM

## 2023-09-29 LAB — CMP (CANCER CENTER ONLY)
ALT: 16 U/L (ref 0–44)
AST: 16 U/L (ref 15–41)
Albumin: 4.3 g/dL (ref 3.5–5.0)
Alkaline Phosphatase: 52 U/L (ref 38–126)
Anion gap: 5 (ref 5–15)
BUN: 10 mg/dL (ref 6–20)
CO2: 31 mmol/L (ref 22–32)
Calcium: 9.7 mg/dL (ref 8.9–10.3)
Chloride: 107 mmol/L (ref 98–111)
Creatinine: 0.68 mg/dL (ref 0.44–1.00)
GFR, Estimated: >60 mL/min (ref >60)
Glucose, Bld: 88 mg/dL (ref 70–99)
Potassium: 3.5 mmol/L (ref 3.5–5.1)
Sodium: 143 mmol/L (ref 135–145)
Total Bilirubin: 0.3 mg/dL (ref 0.0–1.2)
Total Protein: 6.9 g/dL (ref 6.5–8.1)

## 2023-09-29 LAB — CBC WITH DIFFERENTIAL (CANCER CENTER ONLY)
Abs Immature Granulocytes: 0.05 10*3/uL (ref 0.00–0.07)
Basophils Absolute: 0.1 10*3/uL (ref 0.0–0.1)
Basophils Relative: 1 %
Eosinophils Absolute: 0.3 10*3/uL (ref 0.0–0.5)
Eosinophils Relative: 5 %
HCT: 40.1 % (ref 36.0–46.0)
Hemoglobin: 14.1 g/dL (ref 12.0–15.0)
Immature Granulocytes: 1 %
Lymphocytes Relative: 34 %
Lymphs Abs: 1.8 10*3/uL (ref 0.7–4.0)
MCH: 29.9 pg (ref 26.0–34.0)
MCHC: 35.2 g/dL (ref 30.0–36.0)
MCV: 85 fL (ref 80.0–100.0)
Monocytes Absolute: 0.5 10*3/uL (ref 0.1–1.0)
Monocytes Relative: 10 %
Neutro Abs: 2.6 10*3/uL (ref 1.7–7.7)
Neutrophils Relative %: 49 %
Platelet Count: 380 10*3/uL (ref 150–400)
RBC: 4.72 MIL/uL (ref 3.87–5.11)
RDW: 14.6 % (ref 11.5–15.5)
WBC Count: 5.3 10*3/uL (ref 4.0–10.5)
nRBC: 0 % (ref 0.0–0.2)

## 2023-09-29 MED ORDER — DEXAMETHASONE SODIUM PHOSPHATE 10 MG/ML IJ SOLN
10.0000 mg | Freq: Once | INTRAMUSCULAR | Status: AC
  Administered 2023-09-29: 10 mg via INTRAVENOUS
  Filled 2023-09-29: qty 1

## 2023-09-29 MED ORDER — PALONOSETRON HCL INJECTION 0.25 MG/5ML
0.2500 mg | Freq: Once | INTRAVENOUS | Status: AC
  Administered 2023-09-29: 0.25 mg via INTRAVENOUS
  Filled 2023-09-29: qty 5

## 2023-09-29 MED ORDER — DIPHENHYDRAMINE HCL 25 MG PO CAPS
25.0000 mg | ORAL_CAPSULE | Freq: Once | ORAL | Status: AC
  Administered 2023-09-29: 25 mg via ORAL
  Filled 2023-09-29: qty 1

## 2023-09-29 MED ORDER — FAM-TRASTUZUMAB DERUXTECAN-NXKI CHEMO 100 MG IV SOLR
5.4000 mg/kg | Freq: Once | INTRAVENOUS | Status: AC
  Administered 2023-09-29: 340 mg via INTRAVENOUS
  Filled 2023-09-29: qty 17

## 2023-09-29 MED ORDER — SODIUM CHLORIDE 0.9 % IV SOLN
150.0000 mg | Freq: Once | INTRAVENOUS | Status: AC
  Administered 2023-09-29: 150 mg via INTRAVENOUS
  Filled 2023-09-29: qty 150

## 2023-09-29 MED ORDER — SODIUM CHLORIDE 0.9% FLUSH
10.0000 mL | Freq: Once | INTRAVENOUS | Status: AC
  Administered 2023-09-29: 10 mL

## 2023-09-29 MED ORDER — HEPARIN SOD (PORK) LOCK FLUSH 100 UNIT/ML IV SOLN
500.0000 [IU] | Freq: Once | INTRAVENOUS | Status: AC | PRN
  Administered 2023-09-29: 500 [IU]

## 2023-09-29 MED ORDER — SODIUM CHLORIDE 0.9% FLUSH
10.0000 mL | INTRAVENOUS | Status: DC | PRN
  Administered 2023-09-29: 10 mL

## 2023-09-29 MED ORDER — ACETAMINOPHEN 325 MG PO TABS
650.0000 mg | ORAL_TABLET | Freq: Once | ORAL | Status: AC
  Administered 2023-09-29: 650 mg via ORAL
  Filled 2023-09-29: qty 2

## 2023-09-29 MED ORDER — DEXTROSE 5 % IV SOLN
INTRAVENOUS | Status: DC
Start: 2023-09-29 — End: 2023-09-29

## 2023-09-29 NOTE — Progress Notes (Signed)
 [facility] Cancer Follow up:    Sherry Berke, MD [address] [place] [address]   DIAGNOSIS:  Cancer Staging  Malignant neoplasm of upper-inner quadrant of right breast in female, estrogen receptor positive (HCC) Staging form: Breast, AJCC 7th Edition - Pathologic stage from 07/14/2012: Stage IV (TX, NX, M1) - Signed by Murleen Arms, MD on 08/28/2021 Specimen type: Core Needle Biopsy Histopathologic type: 9931 Laterality: Right   SUMMARY OF ONCOLOGIC HISTORY: Oncology History  Malignant neoplasm of upper-inner quadrant of right breast in female, estrogen receptor positive (HCC)  07/13/2012 Clinical Stage   Stage IA: T1c N0   07/14/2012 Definitive Surgery   Bilateral mastectomy/right SLNB: RIGHT invasive ductal carcinoma, grade 3, ER+, PR +, Her2/neu positive (ratio 3.02), Ki67 48%. DCIS. 1/4 LN positive for malignancy. LEFT: benign   07/14/2012 Pathologic Stage   Stage IIB: T2 N1a M0   07/14/2012 Cancer Staging   Staging form: Breast, AJCC 7th Edition - Pathologic stage from 07/14/2012: Stage IV (TX, NX, M1) - Signed by Murleen Arms, MD on 08/28/2021 Specimen type: Core Needle Biopsy Histopathologic type: 9931 Laterality: Right   08/17/2012 - 08/30/2013 Chemotherapy   Adjuvant carboplatin, docetaxel, and trastuzumab x 6 cycles (completed 11/30/2012) followed by maintenance trastuzumab to total one year   11/2012 Procedure   Comp Cancer Gene panel (GeneDx) negative for deleterious mutations    - 02/2013 Radiation Therapy   Adjuvant RT to right breast   02/2013 -  Anti-estrogen oral therapy   Tamoxifen 20 mg daily   09/24/2013 Surgery   Bilateral breast reconstruction with latissmus flap and expander placement   12/12/2013 Surgery   Implant placement    Relapse/Recurrence   Left sided malignant pleural effusion.  Tumor cells are positive for GATA3 and ER, negative for TTF-1 consistent with recurrent breast carcinoma Prognostic showed ER 90%  positive strong staining, PR 80% positive strong staining, HER2 negative.    09/16/2021 Imaging   PET scan showed malignant left pleural effusion with extensive areas of nodularity and hypermetabolic activity involving the mediastinal border and pericardium as well as the most inferior aspects of the costodiaphragmatic recess.  Signs of nodal disease at the thoracic inlet on the left suspect extension of disease below the diaphragm along the left anterolateral aorta.  Nonspecific moderate to markedly increased metabolic activity about the base of the tongue bilaterally.  Consider direct visualization showing mildly asymmetric uptake favoring the right lingual tonsil.  Sclerotic foci without marked increased metabolic activity suspicious for metastatic disease perhaps treated or questions.  Heterogeneous marrow uptake seen elsewhere generalized and nonspecific.  Consider spinal MRI is warranted  MRI brain without any evidence of intracranial metastatic disease.   10/09/2021 - 02/11/2022 Chemotherapy   Taxotere, Herceptin, Perjeta x 6   12/04/2021 Imaging   There is no evidence new metastatic disease. There is interval decrease in the left pleural effusion possibly suggesting resolving pleural metastatic disease. Few scattered sclerotic metastatic lesions in the skeletal structures have not changed significantly.   No acute findings are seen in the chest abdomen and pelvis.     02/22/2022 -  Antibody Plan   Herceptin/Perjeta every 3 weeks   03/21/2022 Imaging   CT chest abdomen pelvis  IMPRESSION: 1. No significant change since previous study. Again demonstrated is a chronic small left pleural effusion with basilar atelectasis and scattered sclerotic skeletal metastases. 2. No acute abnormalities are demonstrated. 3. Small esophageal hiatal hernia.     Electronically Signed   By:  Burman Nieves M.D.   On: 03/21/2022 20:23   07/22/2023 -  Chemotherapy   Patient is on Treatment Plan :  BREAST Fam-Trastuzumab Deruxtecan-nxki (Enhertu) (5.4) q21d       CURRENT THERAPY:Herceptin/Perjeta; tamoxifen  INTERVAL HISTORY:  Discussed the use of AI scribe software for clinical note transcription with the patient, who gave verbal consent to proceed.  History of Present Illness    Sherry Barry is a 53 year old female who presents for follow-up while on Enhertu.    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  MEDICAL HISTORY: Past Medical History:  Diagnosis Date   Allergy    Breast cancer (HCC)    Breast cancer (HCC)    right   History of chemotherapy    doxetaxel/carboplatin/trastuzumab   History of migraine    last one about a week ago   Hx of radiation therapy 01/01/13- 02/15/13   r chest wall, r supraclav/axilla 5040 cGy/28 sessions, scar boost 1000 cGy/5 sessions   Hypertension    no meds,   urgent care on pomana   Migraine    Migraine     SURGICAL HISTORY: Past Surgical History:  Procedure Laterality Date   ABDOMINAL HYSTERECTOMY     no salpingo-oophorectomy 2009   BREAST RECONSTRUCTION WITH PLACEMENT OF TISSUE EXPANDER AND FLEX HD (ACELLULAR HYDRATED DERMIS) Left 09/24/2013   IR IMAGING GUIDED PORT INSERTION  10/07/2021    IR THORACENTESIS ASP PLEURAL SPACE W/IMG GUIDE  08/11/2021   IR THORACENTESIS ASP PLEURAL SPACE W/IMG GUIDE  10/07/2021   LATISSIMUS FLAP TO BREAST Right 09/24/2013   Procedure: RIGHT LATISSMUS MYOCUTAEIOUS MUSCLE FLAP AND PLACEMENT OF TISSUE Lurena Nida;  Surgeon: Wayland Denis, DO;  Location: [facility];  Service: Plastics;  Laterality: Right;   LIPOSUCTION WITH LIPOFILLING Bilateral 12/12/2013   Procedure: LIPOSUCTION WITH LIPOFILLING;  Surgeon: Wayland Denis, DO;  Location: [facility];  Service: Plastics;  Laterality: Bilateral;   PORT-A-CATH REMOVAL Left 12/12/2013   Procedure: REMOVAL PORT-A-CATH;  Surgeon: Wayland Denis, DO;  Location: [facility];  Service: Plastics;  Laterality: Left;   PORTACATH PLACEMENT  07/14/2012   Procedure: INSERTION PORT-A-CATH;  Surgeon: Clovis Pu. Cornett, MD;  Location: [facility];  Service: General;  Laterality: Left;   RECONSTRUCTION BREAST W/ LATISSIMUS DORSI FLAP Right 09/24/2013   "& tissue expander placement"   REMOVAL OF BILATERAL TISSUE EXPANDERS WITH PLACEMENT OF BILATERAL BREAST IMPLANTS Bilateral 12/12/2013   Procedure: REMOVAL OF BILATERAL TISSUE EXPANDERS WITH PLACEMENT OF BILATERAL BREAST IMPLANTS/BILATERAL CAPSULECTOMIES WITH  LIPOFILLING FAT GRAFTING;  Surgeon: Wayland Denis, DO;  Location: [facility];  Service: Plastics;  Laterality: Bilateral;   SIMPLE MASTECTOMY WITH AXILLARY SENTINEL NODE BIOPSY  07/14/2012   Procedure: SIMPLE MASTECTOMY WITH AXILLARY SENTINEL NODE BIOPSY;  Surgeon: Clovis Pu. Cornett, MD;  Location: [facility];  Service: General;  Laterality: Right;  Bilateral simple mastectomy with port and right sebtibel lymph node mapping   SIMPLE MASTECTOMY WITH AXILLARY SENTINEL NODE BIOPSY  07/14/2012   Procedure: SIMPLE MASTECTOMY;  Surgeon: Clovis Pu. Cornett, MD;  Location: [facility];  Service: General;  Laterality: Left;   TISSUE EXPANDER PLACEMENT Left 09/24/2013   Procedure: PLACEMENT OF TISSUE EXPANDER AND FLEX HD TO LEFT  BREAST;  Surgeon: Wayland Denis, DO;  Location: [facility];  Service: Plastics;  Laterality: Left;    SOCIAL HISTORY: Social History   Socioeconomic History   Marital status: Married    Spouse name: Not on file   Number of children: 2   Years of education: Not on file   Highest education level: Not on file  Occupational History    Employer: [employer]  Tobacco Use   Smoking status: Never   Smokeless tobacco: Never  Substance and Sexual Activity   Alcohol use: Yes    Comment: occasional   Drug use: No   Sexual activity: Yes    Birth control/protection: Surgical  Other Topics Concern   Not on file  Social History Narrative   Not on file   Social Drivers of Health   Financial Resource Strain: Not on file  Food Insecurity: Not on file  Transportation Needs: Not on file  Physical Activity: Not on file  Stress: Not on file  Social Connections: Not on file  Intimate Partner Violence: Not on file    FAMILY HISTORY: Family History  Problem Relation Age of Onset   Hypertension Mother    Alcohol abuse Mother    Heart disease Maternal Grandmother    Stroke Maternal Grandfather    Colon cancer Maternal Aunt 55       alive at 1   Brain cancer Maternal Uncle 68       and lymphoma in early 21s; deceased   Brain cancer Maternal Uncle 60       deceased   Pancreatic cancer Maternal Uncle 45       alive at 51   Breast cancer Cousin 39       mat 1st cousin once removed through mat GF ; deceased   Breast cancer Maternal Aunt        great aunt through mat GF; dx at ? age    Review of Systems  Constitutional:  Negative for appetite change, chills, fatigue, fever and unexpected weight change.  HENT:   Negative for hearing loss, lump/mass and trouble swallowing.   Eyes:  Negative for eye problems and icterus.  Respiratory:  Negative for chest tightness, cough and shortness of breath.   Cardiovascular:  Negative for chest pain, leg swelling and palpitations.  Gastrointestinal:   Negative for abdominal distention, abdominal pain, constipation, diarrhea, nausea and vomiting.  Endocrine: Negative for hot flashes.  Genitourinary:  Negative for difficulty urinating.   Musculoskeletal:  Negative for arthralgias.  Skin:  Negative for itching and rash.  Neurological:  Negative for dizziness, extremity weakness, headaches and numbness.  Hematological:  Negative for adenopathy. Does not bruise/bleed easily.  Psychiatric/Behavioral:  Negative for depression. The patient is not nervous/anxious.       PHYSICAL EXAMINATION    Vitals:   09/29/23 1233  BP: 119/89  Pulse: 99  Resp: 16  Temp: 98.2 F (36.8 C)  SpO2: 100%     Physical Exam Constitutional:      General: She is not in acute distress.    Appearance: Normal appearance. She  is not toxic-appearing.  HENT:     Head: Normocephalic and atraumatic.  Eyes:     General: No scleral icterus. Cardiovascular:     Rate and Rhythm: Normal rate and regular rhythm.     Pulses: Normal pulses.     Heart sounds: Normal heart sounds.  Pulmonary:     Effort: Pulmonary effort is normal.     Breath sounds: Normal breath sounds.  Abdominal:     General: Abdomen is flat. Bowel sounds are normal. There is no distension.     Palpations: Abdomen is soft.     Tenderness: There is no abdominal tenderness.  Musculoskeletal:        General: No swelling.     Cervical back: Neck supple.  Lymphadenopathy:     Cervical: No cervical adenopathy.  Skin:    General: Skin is warm and dry.     Findings: No rash.  Neurological:     General: No focal deficit present.     Mental Status: She is alert.  Psychiatric:        Mood and Affect: Mood normal.        Behavior: Behavior normal.     LABORATORY DATA:  CBC    Component Value Date/Time   WBC 5.3 09/29/2023 1155   WBC 5.4 09/13/2022 1656   RBC 4.72 09/29/2023 1155   HGB 14.1 09/29/2023 1155   HGB 15.9 10/30/2019 1030   HGB 14.7 02/15/2017 0852   HCT 40.1 09/29/2023  1155   HCT 46.1 10/30/2019 1030   HCT 42.9 02/15/2017 0852   PLT 380 09/29/2023 1155   PLT 245 10/30/2019 1030   MCV 85.0 09/29/2023 1155   MCV 88 10/30/2019 1030   MCV 86.7 02/15/2017 0852   MCH 29.9 09/29/2023 1155   MCHC 35.2 09/29/2023 1155   RDW 14.6 09/29/2023 1155   RDW 12.5 10/30/2019 1030   RDW 12.6 02/15/2017 0852   LYMPHSABS 1.8 09/29/2023 1155   LYMPHSABS 1.4 02/15/2017 0852   MONOABS 0.5 09/29/2023 1155   MONOABS 0.3 02/15/2017 0852   EOSABS 0.3 09/29/2023 1155   EOSABS 0.1 02/15/2017 0852   BASOSABS 0.1 09/29/2023 1155   BASOSABS 0.0 02/15/2017 0852    CMP     Component Value Date/Time   NA 142 09/08/2023 1358   NA 140 10/30/2019 1030   NA 141 02/15/2017 0852   K 3.5 09/08/2023 1358   K 3.9 02/15/2017 0852   CL 108 09/08/2023 1358   CL 101 11/15/2012 0904   CO2 28 09/08/2023 1358   CO2 25 02/15/2017 0852   GLUCOSE 86 09/08/2023 1358   GLUCOSE 87 02/15/2017 0852   GLUCOSE 69 (L) 11/15/2012 0904   BUN 13 09/08/2023 1358   BUN 11 10/30/2019 1030   BUN 12.5 02/15/2017 0852   CREATININE 0.64 09/08/2023 1358   CREATININE 0.9 02/15/2017 0852   CALCIUM 9.2 09/08/2023 1358   CALCIUM 9.3 02/15/2017 0852   PROT 6.9 09/08/2023 1358   PROT 6.7 10/30/2019 1030   PROT 7.0 02/15/2017 0852   ALBUMIN 4.2 09/08/2023 1358   ALBUMIN 4.2 10/30/2019 1030   ALBUMIN 3.6 02/15/2017 0852   AST 16 09/08/2023 1358   AST 16 02/15/2017 0852   ALT 13 09/08/2023 1358   ALT 13 02/15/2017 0852   ALKPHOS 49 09/08/2023 1358   ALKPHOS 63 02/15/2017 0852   BILITOT 0.3 09/08/2023 1358   BILITOT 0.69 02/15/2017 0852   GFRNONAA >60 09/08/2023 1358   GFRAA 100 10/30/2019 1030  ASSESSMENT and THERAPY PLAN:   No problem-specific Assessment & Plan notes found for this encounter.     All questions were answered. The patient knows to call the clinic with any problems, questions or concerns. We can certainly see the patient much sooner if necessary.  Total encounter  time:20 minutes*in face-to-face visit time, chart review, lab review, care coordination, order entry, and documentation of the encounter time.  *Total Encounter Time as defined by the Centers for Medicare and Medicaid Services includes, in addition to the face-to-face time of a patient visit (documented in the note above) non-face-to-face time: obtaining and reviewing outside history, ordering and reviewing medications, tests or procedures, care coordination (communications with other health care professionals or caregivers) and documentation in the medical record.

## 2023-09-29 NOTE — Progress Notes (Signed)
 [facility] Cancer Follow up:    Sherry Bur, MD [address] [place] [address]   DIAGNOSIS:  Cancer Staging  Malignant neoplasm of upper-inner quadrant of right breast in female, estrogen receptor positive (HCC) Staging form: Breast, AJCC 7th Edition - Pathologic stage from 07/14/2012: Stage IV (TX, NX, M1) - Signed by Rachel Moulds, MD on 08/28/2021 Specimen type: Core Needle Biopsy Histopathologic type: 9931 Laterality: Right   SUMMARY OF ONCOLOGIC HISTORY: Oncology History  Malignant neoplasm of upper-inner quadrant of right breast in female, estrogen receptor positive (HCC)  07/13/2012 Clinical Stage   Stage IA: T1c N0   07/14/2012 Definitive Surgery   Bilateral mastectomy/right SLNB: RIGHT invasive ductal carcinoma, grade 3, ER+, PR +, Her2/neu positive (ratio 3.02), Ki67 48%. DCIS. 1/4 LN positive for malignancy. LEFT: benign   07/14/2012 Pathologic Stage   Stage IIB: T2 N1a M0   07/14/2012 Cancer Staging   Staging form: Breast, AJCC 7th Edition - Pathologic stage from 07/14/2012: Stage IV (TX, NX, M1) - Signed by Rachel Moulds, MD on 08/28/2021 Specimen type: Core Needle Biopsy Histopathologic type: 9931 Laterality: Right   08/17/2012 - 08/30/2013 Chemotherapy   Adjuvant carboplatin, docetaxel, and trastuzumab x 6 cycles (completed 11/30/2012) followed by maintenance trastuzumab to total one year   11/2012 Procedure   Comp Cancer Gene panel (GeneDx) negative for deleterious mutations    - 02/2013 Radiation Therapy   Adjuvant RT to right breast   02/2013 -  Anti-estrogen oral therapy   Tamoxifen 20 mg daily   09/24/2013 Surgery   Bilateral breast reconstruction with latissmus flap and expander placement   12/12/2013 Surgery   Implant placement    Relapse/Recurrence   Left sided malignant pleural effusion.  Tumor cells are positive for GATA3 and ER, negative for TTF-1 consistent with recurrent breast carcinoma Prognostic showed ER 90%  positive strong staining, PR 80% positive strong staining, HER2 negative.    09/16/2021 Imaging   PET scan showed malignant left pleural effusion with extensive areas of nodularity and hypermetabolic activity involving the mediastinal border and pericardium as well as the most inferior aspects of the costodiaphragmatic recess.  Signs of nodal disease at the thoracic inlet on the left suspect extension of disease below the diaphragm along the left anterolateral aorta.  Nonspecific moderate to markedly increased metabolic activity about the base of the tongue bilaterally.  Consider direct visualization showing mildly asymmetric uptake favoring the right lingual tonsil.  Sclerotic foci without marked increased metabolic activity suspicious for metastatic disease perhaps treated or questions.  Heterogeneous marrow uptake seen elsewhere generalized and nonspecific.  Consider spinal MRI is warranted  MRI brain without any evidence of intracranial metastatic disease.   10/09/2021 - 02/11/2022 Chemotherapy   Taxotere, Herceptin, Perjeta x 6   12/04/2021 Imaging   There is no evidence new metastatic disease. There is interval decrease in the left pleural effusion possibly suggesting resolving pleural metastatic disease. Few scattered sclerotic metastatic lesions in the skeletal structures have not changed significantly.   No acute findings are seen in the chest abdomen and pelvis.     02/22/2022 -  Antibody Plan   Herceptin/Perjeta every 3 weeks   03/21/2022 Imaging   CT chest abdomen pelvis  IMPRESSION: 1. No significant change since previous study. Again demonstrated is a chronic small left pleural effusion with basilar atelectasis and scattered sclerotic skeletal metastases. 2. No acute abnormalities are demonstrated. 3. Small esophageal hiatal hernia.     Electronically Signed   By:  Burman Nieves M.D.   On: 03/21/2022 20:23   07/22/2023 -  Chemotherapy   Patient is on Treatment Plan :  BREAST Fam-Trastuzumab Deruxtecan-nxki (Enhertu) (5.4) q21d       CURRENT THERAPY:Herceptin/Perjeta; tamoxifen  INTERVAL HISTORY:  Discussed the use of AI scribe software for clinical note transcription with the patient, who gave verbal consent to proceed.  History of Present Illness Sherry Barry is a 53 year old female with pleural effusion and bone metastases who presents for follow-up while on Enhertu.  She feels 'perfectly fine' overall, with improved breathing due to the management of her pleural effusion. Previously, the pleural effusion caused breathing difficulties, but there is now significant improvement.  She experiences significant nausea primarily during the first week after treatment. To manage this, she takes Compazine every six hours during the day and olanzapine at night, which also helps her sleep. She avoids taking Zofran and olanzapine together due to potential QT interval prolongation, as advised by her pharmacist.  She has been prescribed carvedilol (Coreg) and has been taking it as directed, which has helped improve her heart rate, now under 100 bpm.  Her family is doing well, with her husband engaged in yard work and her oldest son working for [employer]. She works from home, which she finds preferable due to the conditions at her workplace.  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  MEDICAL HISTORY: Past Medical History:  Diagnosis Date   Allergy    Breast cancer (HCC)    Breast cancer (HCC)    right   History of chemotherapy    doxetaxel/carboplatin/trastuzumab   History of migraine    last one about a week ago   Hx of radiation therapy 01/01/13- 02/15/13   r chest wall, r supraclav/axilla 5040 cGy/28 sessions, scar boost 1000 cGy/5 sessions   Hypertension    no meds,   urgent care on pomana   Migraine    Migraine     SURGICAL HISTORY: Past Surgical History:  Procedure Laterality Date   ABDOMINAL HYSTERECTOMY     no salpingo-oophorectomy 2009   BREAST RECONSTRUCTION WITH PLACEMENT OF TISSUE EXPANDER AND FLEX HD (ACELLULAR HYDRATED DERMIS) Left 09/24/2013   IR IMAGING GUIDED PORT INSERTION  10/07/2021   IR THORACENTESIS ASP PLEURAL SPACE W/IMG GUIDE  08/11/2021   IR THORACENTESIS ASP PLEURAL SPACE W/IMG GUIDE  10/07/2021   LATISSIMUS FLAP TO BREAST Right 09/24/2013   Procedure: RIGHT LATISSMUS MYOCUTAEIOUS MUSCLE FLAP AND PLACEMENT OF TISSUE Lurena Nida;  Surgeon: Wayland Denis, DO;  Location: [facility];  Service: Plastics;  Laterality: Right;   LIPOSUCTION WITH LIPOFILLING Bilateral 12/12/2013   Procedure: LIPOSUCTION WITH LIPOFILLING;  Surgeon: Wayland Denis, DO;  Location: [facility];  Service: Plastics;  Laterality: Bilateral;   PORT-A-CATH REMOVAL Left 12/12/2013   Procedure: REMOVAL PORT-A-CATH;  Surgeon: Wayland Denis,  DO;  Location: [facility];  Service: Plastics;  Laterality: Left;   PORTACATH PLACEMENT  07/14/2012   Procedure: INSERTION PORT-A-CATH;  Surgeon: Clovis Pu. Cornett, MD;  Location: [facility];  Service: General;  Laterality: Left;   RECONSTRUCTION BREAST W/ LATISSIMUS DORSI FLAP Right 09/24/2013   "& tissue expander placement"   REMOVAL OF BILATERAL TISSUE EXPANDERS WITH  PLACEMENT OF BILATERAL BREAST IMPLANTS Bilateral 12/12/2013   Procedure: REMOVAL OF BILATERAL TISSUE EXPANDERS WITH PLACEMENT OF BILATERAL BREAST IMPLANTS/BILATERAL CAPSULECTOMIES WITH  LIPOFILLING FAT GRAFTING;  Surgeon: Wayland Denis, DO;  Location: [facility];  Service: Plastics;  Laterality: Bilateral;   SIMPLE MASTECTOMY WITH AXILLARY SENTINEL NODE BIOPSY  07/14/2012   Procedure: SIMPLE MASTECTOMY WITH AXILLARY SENTINEL NODE BIOPSY;  Surgeon: Clovis Pu. Cornett, MD;  Location: [facility];  Service: General;  Laterality: Right;  Bilateral simple mastectomy with port and right sebtibel lymph node mapping   SIMPLE MASTECTOMY WITH AXILLARY SENTINEL NODE BIOPSY  07/14/2012   Procedure: SIMPLE MASTECTOMY;  Surgeon: Clovis Pu. Cornett, MD;  Location: [facility];  Service: General;  Laterality: Left;   TISSUE EXPANDER PLACEMENT Left 09/24/2013   Procedure: PLACEMENT OF TISSUE EXPANDER AND FLEX HD TO LEFT BREAST;  Surgeon: Wayland Denis, DO;  Location: [facility];  Service: Plastics;  Laterality: Left;    SOCIAL HISTORY: Social History   Socioeconomic History   Marital status: Married    Spouse name: Not on file   Number of children: 2   Years of education: Not on file   Highest education level: Not on file  Occupational History    Employer: [employer]  Tobacco Use   Smoking status: Never   Smokeless tobacco: Never  Substance and Sexual Activity   Alcohol use: Yes    Comment: occasional   Drug use: No   Sexual activity: Yes    Birth control/protection: Surgical  Other Topics Concern   Not on file  Social History Narrative   Not on file   Social Drivers of Health   Financial Resource Strain: Not on file  Food Insecurity: Not on file  Transportation Needs: Not on file  Physical Activity: Not on file  Stress: Not on file  Social Connections: Not on file  Intimate Partner Violence: Not on file    FAMILY HISTORY: Family History  Problem Relation Age of Onset   Hypertension Mother     Alcohol abuse Mother    Heart disease Maternal Grandmother    Stroke Maternal Grandfather    Colon cancer Maternal Aunt 55       alive at 47   Brain cancer Maternal Uncle 42       and lymphoma in early 64s; deceased   Brain cancer Maternal Uncle 60       deceased   Pancreatic cancer Maternal Uncle 45       alive at 45   Breast cancer Cousin 49       mat 1st cousin once removed through mat GF ; deceased   Breast cancer Maternal Aunt        great aunt through mat GF; dx at ? age    Review of Systems  Constitutional:  Negative for appetite change, chills, fatigue, fever and unexpected weight change.  HENT:   Negative for hearing loss, lump/mass and trouble swallowing.   Eyes:  Negative for eye problems and icterus.  Respiratory:  Negative for chest tightness, cough and shortness of breath.   Cardiovascular:  Negative for  chest pain, leg swelling and palpitations.  Gastrointestinal:  Negative for abdominal distention, abdominal pain, constipation, diarrhea, nausea and vomiting.  Endocrine: Negative for hot flashes.  Genitourinary:  Negative for difficulty urinating.   Musculoskeletal:  Negative for arthralgias.  Skin:  Negative for itching and rash.  Neurological:  Negative for dizziness, extremity weakness, headaches and numbness.  Hematological:  Negative for adenopathy. Does not bruise/bleed easily.  Psychiatric/Behavioral:  Negative for depression. The patient is not nervous/anxious.       PHYSICAL EXAMINATION    Vitals:   09/29/23 1233  BP: 119/89  Pulse: 99  Resp: 16  Temp: 98.2 F (36.8 C)  SpO2: 100%     Physical Exam Constitutional:      General: She is not in acute distress.    Appearance: Normal appearance. She is not toxic-appearing.  HENT:     Head: Normocephalic and atraumatic.  Eyes:     General: No scleral icterus. Cardiovascular:     Rate and Rhythm: Normal rate and regular rhythm.     Pulses: Normal pulses.     Heart sounds: Normal heart  sounds.  Pulmonary:     Effort: Pulmonary effort is normal.     Breath sounds: Normal breath sounds.  Abdominal:     General: Abdomen is flat. Bowel sounds are normal. There is no distension.     Palpations: Abdomen is soft.     Tenderness: There is no abdominal tenderness.  Musculoskeletal:        General: No swelling.     Cervical back: Neck supple.  Lymphadenopathy:     Cervical: No cervical adenopathy.  Skin:    General: Skin is warm and dry.     Findings: No rash.  Neurological:     General: No focal deficit present.     Mental Status: She is alert.  Psychiatric:        Mood and Affect: Mood normal.        Behavior: Behavior normal.     LABORATORY DATA:  CBC    Component Value Date/Time   WBC 5.3 09/29/2023 1155   WBC 5.4 09/13/2022 1656   RBC 4.72 09/29/2023 1155   HGB 14.1 09/29/2023 1155   HGB 15.9 10/30/2019 1030   HGB 14.7 02/15/2017 0852   HCT 40.1 09/29/2023 1155   HCT 46.1 10/30/2019 1030   HCT 42.9 02/15/2017 0852   PLT 380 09/29/2023 1155   PLT 245 10/30/2019 1030   MCV 85.0 09/29/2023 1155   MCV 88 10/30/2019 1030   MCV 86.7 02/15/2017 0852   MCH 29.9 09/29/2023 1155   MCHC 35.2 09/29/2023 1155   RDW 14.6 09/29/2023 1155   RDW 12.5 10/30/2019 1030   RDW 12.6 02/15/2017 0852   LYMPHSABS 1.8 09/29/2023 1155   LYMPHSABS 1.4 02/15/2017 0852   MONOABS 0.5 09/29/2023 1155   MONOABS 0.3 02/15/2017 0852   EOSABS 0.3 09/29/2023 1155   EOSABS 0.1 02/15/2017 0852   BASOSABS 0.1 09/29/2023 1155   BASOSABS 0.0 02/15/2017 0852    CMP     Component Value Date/Time   NA 143 09/29/2023 1155   NA 140 10/30/2019 1030   NA 141 02/15/2017 0852   K 3.5 09/29/2023 1155   K 3.9 02/15/2017 0852   CL 107 09/29/2023 1155   CL 101 11/15/2012 0904   CO2 31 09/29/2023 1155   CO2 25 02/15/2017 0852   GLUCOSE 88 09/29/2023 1155   GLUCOSE 87 02/15/2017 0852   GLUCOSE 69 (L) 11/15/2012  0904   BUN 10 09/29/2023 1155   BUN 11 10/30/2019 1030   BUN 12.5  02/15/2017 0852   CREATININE 0.68 09/29/2023 1155   CREATININE 0.9 02/15/2017 0852   CALCIUM 9.7 09/29/2023 1155   CALCIUM 9.3 02/15/2017 0852   PROT 6.9 09/29/2023 1155   PROT 6.7 10/30/2019 1030   PROT 7.0 02/15/2017 0852   ALBUMIN 4.3 09/29/2023 1155   ALBUMIN 4.2 10/30/2019 1030   ALBUMIN 3.6 02/15/2017 0852   AST 16 09/29/2023 1155   AST 16 02/15/2017 0852   ALT 16 09/29/2023 1155   ALT 13 02/15/2017 0852   ALKPHOS 52 09/29/2023 1155   ALKPHOS 63 02/15/2017 0852   BILITOT 0.3 09/29/2023 1155   BILITOT 0.69 02/15/2017 0852   GFRNONAA >60 09/29/2023 1155   GFRAA 100 10/30/2019 1030       ASSESSMENT and THERAPY PLAN:   Malignant neoplasm of upper-inner quadrant of right breast in female, estrogen receptor positive (HCC) Sherry Barry is a 53 year old woman with stage IV triple positive breast cancer s/p first line THP and then on HP maintenance now with progression andis here to initiate second line with Enhertu. She is here before planned 4 th cycle of Enhertu. Assessment & Plan Metastatic breast cancer Most recent scan with mixed reponse. Most of the lung nodules showed response except for the pleural nodule in the left side which is mildly larger. Pleural effusion is better as well At this time since most of the areas responded, we agreed to continue enhertu.  Nausea related to HER2 treatment Significant nausea post-treatment managed with olanzapine and Compazine. Avoids Zofran due to QT concerns. - Discussed with our pharm D, will add olanzapine as pre med at 5 mg dosing. - Will continue to monitor  Bone metastases Bone metastases present with sclerotic changes suggesting new bone formation. - Administer Zometa every twelve weeks.  Heart rate management Heart rate improved, now under 100 bpm with carvedilol. - Follow-up with Dr. Mitzie Anda next Tuesday.  Sherry Arms MD       All questions were answered. The patient knows to call the clinic with any problems,  questions or concerns. We can certainly see the patient much sooner if necessary.  Total encounter time:30 minutes*in face-to-face visit time, chart review, lab review, care coordination, order entry, and documentation of the encounter time.  *Total Encounter Time as defined by the Centers for Medicare and Medicaid Services includes, in addition to the face-to-face time of a patient visit (documented in the note above) non-face-to-face time: obtaining and reviewing outside history, ordering and reviewing medications, tests or procedures, care coordination (communications with other health care professionals or caregivers) and documentation in the medical record.

## 2023-09-29 NOTE — Patient Instructions (Signed)

## 2023-09-29 NOTE — Assessment & Plan Note (Addendum)
 Sherry Barry is a 53 year old woman with stage IV triple positive breast cancer s/p first line THP and then on HP maintenance now with progression andis here to initiate second line with Enhertu. She is here before planned 4 th cycle of Enhertu. Assessment & Plan Metastatic breast cancer Most recent scan with mixed reponse. Most of the lung nodules showed response except for the pleural nodule in the left side which is mildly larger. Pleural effusion is better as well At this time since most of the areas responded, we agreed to continue enhertu.  Nausea related to HER2 treatment Significant nausea post-treatment managed with olanzapine and Compazine. Avoids Zofran due to QT concerns. - Discussed with our pharm D, will add olanzapine as pre med at 5 mg dosing. - Will continue to monitor  Bone metastases Bone metastases present with sclerotic changes suggesting new bone formation. - Administer Zometa every twelve weeks.  Heart rate management Heart rate improved, now under 100 bpm with carvedilol. - Follow-up with Dr. Mitzie Anda next Tuesday.  Murleen Arms MD

## 2023-10-02 ENCOUNTER — Other Ambulatory Visit: Payer: Self-pay | Admitting: Hematology and Oncology

## 2023-10-03 ENCOUNTER — Encounter: Payer: Self-pay | Admitting: Hematology and Oncology

## 2023-10-04 ENCOUNTER — Ambulatory Visit (HOSPITAL_BASED_OUTPATIENT_CLINIC_OR_DEPARTMENT_OTHER)
Admission: RE | Admit: 2023-10-04 | Discharge: 2023-10-04 | Disposition: A | Discharge status: Home / Self Care | Source: Ambulatory Visit | Attending: Cardiology | Admitting: Cardiology

## 2023-10-04 ENCOUNTER — Ambulatory Visit (HOSPITAL_COMMUNITY)
Admission: RE | Admit: 2023-10-04 | Discharge: 2023-10-04 | Disposition: A | Discharge status: Home / Self Care | Source: Ambulatory Visit | Attending: Family Medicine | Admitting: Family Medicine

## 2023-10-04 ENCOUNTER — Encounter (HOSPITAL_COMMUNITY): Payer: Self-pay | Admitting: Cardiology

## 2023-10-04 VITALS — BP 110/70 | HR 75 | Wt 146.2 lb

## 2023-10-04 DIAGNOSIS — C50211 Malignant neoplasm of upper-inner quadrant of right female breast: Secondary | ICD-10-CM | POA: Diagnosis not present

## 2023-10-04 DIAGNOSIS — Z0189 Encounter for other specified special examinations: Secondary | ICD-10-CM | POA: Diagnosis not present

## 2023-10-04 DIAGNOSIS — Z79899 Other long term (current) drug therapy: Secondary | ICD-10-CM | POA: Insufficient documentation

## 2023-10-04 DIAGNOSIS — I509 Heart failure, unspecified: Secondary | ICD-10-CM | POA: Insufficient documentation

## 2023-10-04 DIAGNOSIS — Z170 Estrogen receptor positive status [ER+]: Secondary | ICD-10-CM | POA: Diagnosis not present

## 2023-10-04 DIAGNOSIS — I110 Hypertensive heart disease with heart failure: Secondary | ICD-10-CM | POA: Diagnosis not present

## 2023-10-04 DIAGNOSIS — R5383 Other fatigue: Secondary | ICD-10-CM | POA: Diagnosis not present

## 2023-10-04 DIAGNOSIS — Z853 Personal history of malignant neoplasm of breast: Secondary | ICD-10-CM

## 2023-10-04 LAB — ECHOCARDIOGRAM COMPLETE
Area-P 1/2: 3.63 cm2
Calc EF: 61.8 %
S' Lateral: 2.7 cm
Single Plane A2C EF: 59 %
Single Plane A4C EF: 64.7 %

## 2023-10-04 NOTE — Progress Notes (Signed)
 Oncology: Dr. Arno Bibles  Chest complaint: Fatigue  53 y.o. with history of stage IV triple positive breast cancer presents for cardio-oncology evaluation.  Initial diagnosis in 1/14.  Bilateral mastectomy, ER+/PR+/HER2+.  She was treated with carboplatin /docetaxel /trastuzumab  x 6 cycles then trastuzumab  alone to complete 1 year.  She had radiation as well. In 4/23, patient was found to have recurrence with left malignant pleural effusion. She completed chemo with docetaxel /trastuzumab /pertuzumab  was continued on trastuzumab .    Echo in 10/23 showed. EF 60-65% with GLS -20.4% (stable). Echo in 1/24 showed EF 60-65%, RV normal, GLS less negative at -15.5% but poor tracking of the endocardium. Echo in 4/24 showed EF 55-60%, GLS -18.4%, normal diastolic function, normal RV.    Echo in 7/24 showed EF 55-60%, normal diastolic function, GLS -20.4%, normal RV.   Echo in 1/25 showed EF 55-60%, GLS -19%, RV normal, PASP 23 mmHg.   Patient did not have cardiac complications from her initial chemotherapy involving trastuzumab  in 2014.  She does have treated HTN.  No family history of cardiomyopathy or early CAD.  Nonsmoker.  She has been switched to Enhertu .  Echo today on Enhertu  was reviewed and shows EF 60-65%, normal diastolic function, normal RV, IVC normal.     She is doing well in general.  No exertional dyspnea or chest pain.  No lightheadedness.  Tolerating Coreg  without problems.    ECG (personally reviewed): NSR, normal  Labs (5/23): K 3.3, creatinine 0.64 Labs (1/24): EF 3.3, creatinine 0.66, hgb 14.6 Labs (4/24): K 2.9, creatinine 0.51 Labs (12/24): K 3.6, creatinine 0.69  PMH: 1. HTN 2. Migraines 3. Breast cancer: Initial diagnosis in 1/14.  Bilateral mastectomy, ER+/PR+/HER2+.  She was treated with carboplatin /docetaxel /trastuzumab  x 6 cycles then trastuzumab  alone to complete 1 year.  She had radiation.  - 4/23 patient had recurrence with left malignant pleural effusion found. She has  started chemo with docetaxel /trastuzumab /pertuzumab .  - Echo (4/23): EF 60-65%, GLS -16.8%, RV normal.  - Echo (7/23): EF 65-70%, GLS -21.3%, RV normal.  - Echo (10/23): EF 60-65%, GLS -20.4%, normal diastolic function, normal RV, mild MR - Echo (1/24): EF 60-65%, RV normal, GLS less negative at -15.5% but poor tracking of the endocardium.  - Echo (4/24): EF 55-60%, GLS -18.4%, normal diastolic function, normal RV.  - Echo (7/24): EF 55-60%, normal diastolic function, GLS -20.4%, normal RV.  - Echo (1/25): EF 55-60%, GLS -19%, RV normal, PASP 23 mmHg.  - Echo (4/25): EF 60-65%, normal diastolic function, normal RV, IVC normal.    Social History   Socioeconomic History   Marital status: Married    Spouse name: Not on file   Number of children: 2   Years of education: Not on file   Highest education level: Not on file  Occupational History    Employer: [employer]  Tobacco Use   Smoking status: Never   Smokeless tobacco: Never  Substance and Sexual Activity   Alcohol use: Yes    Comment: occasional   Drug use: No   Sexual activity: Yes    Birth control/protection: Surgical  Other Topics Concern   Not on file  Social History Narrative   Not on file   Social Drivers of Health   Financial Resource Strain: Not on file  Food Insecurity: Not on file  Transportation Needs: Not on file  Physical Activity: Not on file  Stress: Not on file  Social Connections: Not on file  Intimate Partner Violence: Not on file   Family  History  Problem Relation Age of Onset   Hypertension Mother    Alcohol abuse Mother    Heart disease Maternal Grandmother    Stroke Maternal Grandfather    Colon cancer Maternal Aunt 55       alive at 32   Brain cancer Maternal Uncle 46       and lymphoma in early 2s; deceased   Brain cancer Maternal Uncle 71       deceased   Pancreatic cancer Maternal Uncle 45       alive at 44   Breast cancer Cousin 44       mat 1st cousin once removed through mat  GF ; deceased   Breast cancer Maternal Aunt        great aunt through mat GF; dx at ? age   ROS: All systems reviewed and negative except as per HPI.   Current Outpatient Medications  Medication Sig Dispense Refill   acetaminophen  (TYLENOL ) 500 MG tablet Take 1,000 mg by mouth every 6 (six) hours as needed for moderate pain.     amLODipine  (NORVASC ) 10 MG tablet Take 1 tablet (10 mg total) by mouth every morning. 30 tablet 3   carvedilol  (COREG ) 3.125 MG tablet Take 1 tablet (3.125 mg total) by mouth 2 (two) times daily with a meal. 60 tablet 1   dexamethasone  (DECADRON ) 4 MG tablet Take 2 tablets (8 mg) by mouth daily for 3 days starting the day after chemotherapy. Take with food. 30 tablet 1   KLOR-CON  M20 20 MEQ tablet TAKE 1 TABLET BY MOUTH EVERY DAY 90 tablet 1   lidocaine -prilocaine  (EMLA ) cream Apply 1 Application topically as needed. 30 g 0   OLANZapine  (ZYPREXA ) 2.5 MG tablet TAKE 1 TABLET BY MOUTH AT BEDTIME. 90 tablet 1   ondansetron  (ZOFRAN ) 8 MG tablet Take 1 tablet (8 mg total) by mouth every 8 (eight) hours as needed for nausea or vomiting. Start on the third day after chemotherapy. 30 tablet 1   prochlorperazine  (COMPAZINE ) 10 MG tablet Take 1 tablet (10 mg total) by mouth every 6 (six) hours as needed for nausea or vomiting. 30 tablet 1   No current facility-administered medications for this encounter.   Facility-Administered Medications Ordered in Other Encounters  Medication Dose Route Frequency Provider Last Rate Last Admin   acetaminophen  (TYLENOL ) 325 MG tablet            diphenhydrAMINE  (BENADRYL ) 25 mg capsule            BP 110/70   Pulse 75   Wt 66.3 kg (146 lb 3.2 oz)   SpO2 99%   BMI 26.74 kg/m  General: NAD Neck: No JVD, no thyromegaly or thyroid  nodule.  Lungs: Clear to auscultation bilaterally with normal respiratory effort. CV: Nondisplaced PMI.  Heart regular S1/S2, no S3/S4, no murmur.  No peripheral edema.  No carotid bruit.  Normal pedal pulses.   Abdomen: Soft, nontender, no hepatosplenomegaly, no distention.  Skin: Intact without lesions or rashes.  Neurologic: Alert and oriented x 3.  Psych: Normal affect. Extremities: No clubbing or cyanosis.  HEENT: Normal.   Assessment/Plan: 1. Stage IV triple positive breast cancer: Patient had trastuzumab  as part of her therapy back in 2014 with no cardiac complications.  She has been back on trastuzumab -based therapy for > 1 year now.  She was recently started on Enhertu .  Echo today on Enhertu  was reviewed and shows EF 60-65%, normal diastolic function, normal RV, IVC normal.   -  Continue Coreg  3.125 mg bid.  - She will continue Enhertu  and I will repeat echo in 6 months.  2. HTN: BP controlled on current amlodipine , continue.   Followup 6 months with echo.  I spent 32 minutes reviewing records, interviewing/examining patient, and managing orders.   Peder Bourdon 10/04/2023

## 2023-10-04 NOTE — Patient Instructions (Signed)
 There has been no changes to your medications.  Your physician has requested that you have an echocardiogram. Echocardiography is a painless test that uses sound waves to create images of your heart. It provides your doctor with information about the size and shape of your heart and how well your heart's chambers and valves are working. This procedure takes approximately one hour. There are no restrictions for this procedure. Please do NOT wear cologne, perfume, aftershave, or lotions (deodorant is allowed). Please arrive 15 minutes prior to your appointment time.  Please note: We ask at that you not bring children with you during ultrasound (echo/ vascular) testing. Due to room size and safety concerns, children are not allowed in the ultrasound rooms during exams. Our front office staff cannot provide observation of children in our lobby area while testing is being conducted. An adult accompanying a patient to their appointment will only be allowed in the ultrasound room at the discretion of the ultrasound technician under special circumstances. We apologize for any inconvenience.  Your physician recommends that you schedule a follow-up appointment in: 6 months with an echocardiogram ( October) ** PLEASE CALL THE OFFICE IN IN JUL[place] TO ARRANGE YOUR FOLLOW UP APPOINTMENT.**  If you have any questions or concerns before your next appointment please send us  a message through [place] or call our office at [phone].    TO LEAVE A MESSAGE FOR THE NURSE SELECT OPTION 2, PLEASE LEAVE A MESSAGE INCLUDING: YOUR NAME DATE OF BIRTH CALL BACK NUMBER REASON FOR CALL**this is important as we prioritize the call backs  YOU WILL RECEIVE A CALL BACK THE SAME DAY AS LONG AS YOU CALL BEFORE 4:00 PM  At the [facility], you and your health needs are our priority. As part of our continuing mission to provide you with exceptional heart care, we have created designated Provider Care Teams. These  Care Teams include your primary Cardiologist (physician) and Advanced Practice Providers (APPs- Physician Assistants and Nurse Practitioners) who all work together to provide you with the care you need, when you need it.   You may see any of the following providers on your designated Care Team at your next follow up: Dr Jules Oar Dr Peder Bourdon Dr. Alwin Baars Dr. Arta Lark Amy Marijane Shoulders, NP Ruddy Corral, [place] [facility] [employer], [place] Dennise Fitz, NP [place] Lee, NP Shawnee Dellen, NP Luster Salters, PharmD Bevely Brush, PharmD   Please be sure to bring in all your medications bottles to every appointment.    Thank you for choosing [facility]

## 2023-10-05 ENCOUNTER — Other Ambulatory Visit: Payer: Self-pay | Admitting: Hematology and Oncology

## 2023-10-14 ENCOUNTER — Inpatient Hospital Stay: Payer: Federal, State, Local not specified - PPO

## 2023-10-19 MED FILL — Fosaprepitant Dimeglumine For IV Infusion 150 MG (Base Eq): INTRAVENOUS | Qty: 5 | Status: AC

## 2023-10-20 ENCOUNTER — Inpatient Hospital Stay: Admitting: Hematology and Oncology

## 2023-10-20 ENCOUNTER — Inpatient Hospital Stay

## 2023-10-20 ENCOUNTER — Inpatient Hospital Stay: Attending: Hematology and Oncology

## 2023-10-20 VITALS — BP 132/86 | HR 98 | Temp 98.9°F | Resp 18 | Ht 62.0 in | Wt 148.7 lb

## 2023-10-20 DIAGNOSIS — Z1732 Human epidermal growth factor receptor 2 negative status: Secondary | ICD-10-CM | POA: Insufficient documentation

## 2023-10-20 DIAGNOSIS — C50211 Malignant neoplasm of upper-inner quadrant of right female breast: Secondary | ICD-10-CM

## 2023-10-20 DIAGNOSIS — Z5112 Encounter for antineoplastic immunotherapy: Secondary | ICD-10-CM | POA: Insufficient documentation

## 2023-10-20 DIAGNOSIS — Z170 Estrogen receptor positive status [ER+]: Secondary | ICD-10-CM | POA: Diagnosis not present

## 2023-10-20 DIAGNOSIS — Z1731 Human epidermal growth factor receptor 2 positive status: Secondary | ICD-10-CM | POA: Diagnosis not present

## 2023-10-20 DIAGNOSIS — C7951 Secondary malignant neoplasm of bone: Secondary | ICD-10-CM | POA: Insufficient documentation

## 2023-10-20 DIAGNOSIS — Z1721 Progesterone receptor positive status: Secondary | ICD-10-CM | POA: Diagnosis not present

## 2023-10-20 DIAGNOSIS — Z9013 Acquired absence of bilateral breasts and nipples: Secondary | ICD-10-CM | POA: Diagnosis not present

## 2023-10-20 LAB — CMP (CANCER CENTER ONLY)
ALT: 16 U/L (ref 0–44)
AST: 17 U/L (ref 15–41)
Albumin: 4 g/dL (ref 3.5–5.0)
Alkaline Phosphatase: 50 U/L (ref 38–126)
Anion gap: 4 — ABNORMAL LOW (ref 5–15)
BUN: 11 mg/dL (ref 6–20)
CO2: 30 mmol/L (ref 22–32)
Calcium: 8.9 mg/dL (ref 8.9–10.3)
Chloride: 109 mmol/L (ref 98–111)
Creatinine: 0.72 mg/dL (ref 0.44–1.00)
GFR, Estimated: >60 mL/min (ref >60)
Glucose, Bld: 90 mg/dL (ref 70–99)
Potassium: 4 mmol/L (ref 3.5–5.1)
Sodium: 143 mmol/L (ref 135–145)
Total Bilirubin: 0.4 mg/dL (ref 0.0–1.2)
Total Protein: 6.6 g/dL (ref 6.5–8.1)

## 2023-10-20 LAB — CBC WITH DIFFERENTIAL (CANCER CENTER ONLY)
Abs Immature Granulocytes: 0.02 10*3/uL (ref 0.00–0.07)
Basophils Absolute: 0.1 10*3/uL (ref 0.0–0.1)
Basophils Relative: 1 %
Eosinophils Absolute: 0.2 10*3/uL (ref 0.0–0.5)
Eosinophils Relative: 4 %
HCT: 38 % (ref 36.0–46.0)
Hemoglobin: 13.4 g/dL (ref 12.0–15.0)
Immature Granulocytes: 0 %
Lymphocytes Relative: 30 %
Lymphs Abs: 1.5 10*3/uL (ref 0.7–4.0)
MCH: 30.3 pg (ref 26.0–34.0)
MCHC: 35.3 g/dL (ref 30.0–36.0)
MCV: 86 fL (ref 80.0–100.0)
Monocytes Absolute: 0.5 10*3/uL (ref 0.1–1.0)
Monocytes Relative: 11 %
Neutro Abs: 2.6 10*3/uL (ref 1.7–7.7)
Neutrophils Relative %: 54 %
Platelet Count: 320 10*3/uL (ref 150–400)
RBC: 4.42 MIL/uL (ref 3.87–5.11)
RDW: 14.7 % (ref 11.5–15.5)
WBC Count: 4.8 10*3/uL (ref 4.0–10.5)
nRBC: 0 % (ref 0.0–0.2)

## 2023-10-20 MED ORDER — FOSAPREPITANT DIMEGLUMINE INJECTION 150 MG
150.0000 mg | Freq: Once | INTRAVENOUS | Status: AC
  Administered 2023-10-20: 150 mg via INTRAVENOUS
  Filled 2023-10-20: qty 150

## 2023-10-20 MED ORDER — DEXTROSE 5 % IV SOLN: INTRAVENOUS | Status: DC

## 2023-10-20 MED ORDER — DEXAMETHASONE SODIUM PHOSPHATE 10 MG/ML IJ SOLN
10.0000 mg | Freq: Once | INTRAMUSCULAR | Status: AC
  Administered 2023-10-20: 10 mg via INTRAVENOUS
  Filled 2023-10-20: qty 1

## 2023-10-20 MED ORDER — FAM-TRASTUZUMAB DERUXTECAN-NXKI CHEMO 100 MG IV SOLR
5.4000 mg/kg | Freq: Once | INTRAVENOUS | Status: AC
  Administered 2023-10-20: 340 mg via INTRAVENOUS
  Filled 2023-10-20: qty 17

## 2023-10-20 MED ORDER — SODIUM CHLORIDE 0.9 % IV SOLN: Freq: Once | INTRAVENOUS | Status: DC

## 2023-10-20 MED ORDER — OLANZAPINE 5 MG PO TABS
5.0000 mg | ORAL_TABLET | Freq: Once | ORAL | Status: AC
Start: 2023-10-20 — End: 2023-10-20
  Administered 2023-10-20: 5 mg via ORAL
  Filled 2023-10-20: qty 1

## 2023-10-20 MED ORDER — SODIUM CHLORIDE 0.9% FLUSH
10.0000 mL | INTRAVENOUS | Status: DC | PRN
  Administered 2023-10-20: 10 mL

## 2023-10-20 MED ORDER — SODIUM CHLORIDE 0.9% FLUSH
10.0000 mL | Freq: Once | INTRAVENOUS | Status: AC
  Administered 2023-10-20: 10 mL

## 2023-10-20 MED ORDER — PALONOSETRON HCL INJECTION 0.25 MG/5ML
0.2500 mg | Freq: Once | INTRAVENOUS | Status: AC
  Administered 2023-10-20: 0.25 mg via INTRAVENOUS
  Filled 2023-10-20: qty 5

## 2023-10-20 MED ORDER — DIPHENHYDRAMINE HCL 25 MG PO CAPS
25.0000 mg | ORAL_CAPSULE | Freq: Once | ORAL | Status: AC
  Administered 2023-10-20: 25 mg via ORAL
  Filled 2023-10-20: qty 1

## 2023-10-20 MED ORDER — ACETAMINOPHEN 325 MG PO TABS
650.0000 mg | ORAL_TABLET | Freq: Once | ORAL | Status: AC
  Administered 2023-10-20: 650 mg via ORAL
  Filled 2023-10-20: qty 2

## 2023-10-20 MED ORDER — ZOLEDRONIC ACID 4 MG/100ML IV SOLN
4.0000 mg | Freq: Once | INTRAVENOUS | Status: AC
Start: 2023-10-20 — End: 2023-10-20
  Administered 2023-10-20: 4 mg via INTRAVENOUS
  Filled 2023-10-20: qty 100

## 2023-10-20 MED ORDER — HEPARIN SOD (PORK) LOCK FLUSH 100 UNIT/ML IV SOLN
500.0000 [IU] | Freq: Once | INTRAVENOUS | Status: AC | PRN
  Administered 2023-10-20: 500 [IU]

## 2023-10-20 NOTE — Progress Notes (Signed)
 Patient Care Team: Arlys Berke, MD as PCP - General (Family Medicine) Ivery Marking, MD as Consulting Physician (Obstetrics and Gynecology) Murleen Arms, MD as Consulting Physician (Hematology and Oncology)  DIAGNOSIS:  Encounter Diagnosis  Name Primary?   Malignant neoplasm of upper-inner quadrant of right breast in female, estrogen receptor positive (HCC) Yes    SUMMARY OF ONCOLOGIC HISTORY: Oncology History  Malignant neoplasm of upper-inner quadrant of right breast in female, estrogen receptor positive (HCC)  07/13/2012 Clinical Stage   Stage IA: T1c N0   07/14/2012 Definitive Surgery   Bilateral mastectomy/right SLNB: RIGHT invasive ductal carcinoma, grade 3, ER+, PR +, Her2/neu positive (ratio 3.02), Ki67 48%. DCIS. 1/4 LN positive for malignancy. LEFT: benign   07/14/2012 Pathologic Stage   Stage IIB: T2 N1a M0   07/14/2012 Cancer Staging   Staging form: Breast, AJCC 7th Edition - Pathologic stage from 07/14/2012: Stage IV (TX, NX, M1) - Signed by Murleen Arms, MD on 08/28/2021 Specimen type: Core Needle Biopsy Histopathologic type: 9931 Laterality: Right   08/17/2012 - 08/30/2013 Chemotherapy   Adjuvant carboplatin , docetaxel , and trastuzumab  x 6 cycles (completed 11/30/2012) followed by maintenance trastuzumab  to total one year   11/2012 Procedure   Comp Cancer Gene panel (GeneDx) negative for deleterious mutations    - 02/2013 Radiation Therapy   Adjuvant RT to right breast   02/2013 -  Anti-estrogen oral therapy   Tamoxifen  20 mg daily   09/24/2013 Surgery   Bilateral breast reconstruction with latissmus flap and expander placement   12/12/2013 Surgery   Implant placement    Relapse/Recurrence   Left sided malignant pleural effusion.  Tumor cells are positive for GATA3 and ER, negative for TTF-1 consistent with recurrent breast carcinoma Prognostic showed ER 90% positive strong staining, PR 80% positive strong staining, HER2 negative.    09/16/2021  Imaging   PET scan showed malignant left pleural effusion with extensive areas of nodularity and hypermetabolic activity involving the mediastinal border and pericardium as well as the most inferior aspects of the costodiaphragmatic recess.  Signs of nodal disease at the thoracic inlet on the left suspect extension of disease below the diaphragm along the left anterolateral aorta.  Nonspecific moderate to markedly increased metabolic activity about the base of the tongue bilaterally.  Consider direct visualization showing mildly asymmetric uptake favoring the right lingual tonsil.  Sclerotic foci without marked increased metabolic activity suspicious for metastatic disease perhaps treated or questions.  Heterogeneous marrow uptake seen elsewhere generalized and nonspecific.  Consider spinal MRI is warranted  MRI brain without any evidence of intracranial metastatic disease.   10/09/2021 - 02/11/2022 Chemotherapy   Taxotere , Herceptin , Perjeta  x 6   12/04/2021 Imaging   There is no evidence new metastatic disease. There is interval decrease in the left pleural effusion possibly suggesting resolving pleural metastatic disease. Few scattered sclerotic metastatic lesions in the skeletal structures have not changed significantly.   No acute findings are seen in the chest abdomen and pelvis.     02/22/2022 -  Antibody Plan   Herceptin /Perjeta  every 3 weeks   03/21/2022 Imaging   CT chest abdomen pelvis  IMPRESSION: 1. No significant change since previous study. Again demonstrated is a chronic small left pleural effusion with basilar atelectasis and scattered sclerotic skeletal metastases. 2. No acute abnormalities are demonstrated. 3. Small esophageal hiatal hernia.     Electronically Signed   By: Boyce Byes M.D.   On: 03/21/2022 20:23   07/22/2023 -  Chemotherapy   Patient  is on Treatment Plan : BREAST Fam-Trastuzumab Deruxtecan-nxki  (Enhertu ) (5.4) q21d       CHIEF COMPLIANT: Cycle 5  Enhertu   HISTORY OF PRESENT ILLNESS:   History of Present Illness Sherry Barry is a 53 year old female undergoing chemotherapy who presents for her fifth treatment session.  She experiences minimal side effects from chemotherapy. Initially, she had severe nausea during the first three treatments, which has since improved.  Constipation is a primary concern. She uses Miralax, Senna, and Colace without relief and has started drinking 'Smooth Move' tea, which provides some relief.  No respiratory symptoms such as coughing or shortness of breath are present.     ALLERGIES:  is allergic to codeine, hydrocodone , latex, lisinopril , and tomato.  MEDICATIONS:  Current Outpatient Medications  Medication Sig Dispense Refill   acetaminophen  (TYLENOL ) 500 MG tablet Take 1,000 mg by mouth every 6 (six) hours as needed for moderate pain.     amLODipine  (NORVASC ) 10 MG tablet Take 1 tablet (10 mg total) by mouth every morning. 30 tablet 3   carvedilol  (COREG ) 3.125 MG tablet TAKE 1 TABLET BY MOUTH TWICE A DAY WITH A MEAL 180 tablet 1   dexamethasone  (DECADRON ) 4 MG tablet Take 2 tablets (8 mg) by mouth daily for 3 days starting the day after chemotherapy. Take with food. 30 tablet 1   KLOR-CON  M20 20 MEQ tablet TAKE 1 TABLET BY MOUTH EVERY DAY 90 tablet 1   lidocaine -prilocaine  (EMLA ) cream Apply 1 Application topically as needed. 30 g 0   OLANZapine  (ZYPREXA ) 2.5 MG tablet TAKE 1 TABLET BY MOUTH AT BEDTIME. 90 tablet 1   ondansetron  (ZOFRAN ) 8 MG tablet Take 1 tablet (8 mg total) by mouth every 8 (eight) hours as needed for nausea or vomiting. Start on the third day after chemotherapy. 30 tablet 1   prochlorperazine  (COMPAZINE ) 10 MG tablet Take 1 tablet (10 mg total) by mouth every 6 (six) hours as needed for nausea or vomiting. 30 tablet 1   No current facility-administered medications for this visit.   Facility-Administered Medications Ordered in Other Visits  Medication Dose Route  Frequency Provider Last Rate Last Admin   acetaminophen  (TYLENOL ) 325 MG tablet            diphenhydrAMINE  (BENADRYL ) 25 mg capsule             PHYSICAL EXAMINATION: ECOG PERFORMANCE STATUS: 1 - Symptomatic but completely ambulatory  Vitals:   10/20/23 1049  BP: 132/86  Pulse: 98  Resp: 18  Temp: 98.9 F (37.2 C)  SpO2: 100%   Filed Weights   10/20/23 1049  Weight: 148 lb 11.2 oz (67.4 kg)      LABORATORY DATA:  I have reviewed the data as listed    Latest Ref Rng & Units 10/20/2023   10:23 AM 09/29/2023   11:55 AM 09/08/2023    1:58 PM  CMP  Glucose 70 - 99 mg/dL 90  88  86   BUN 6 - 20 mg/dL 11  10  13    Creatinine 0.44 - 1.00 mg/dL 0.98  1.19  1.47   Sodium 135 - 145 mmol/L 143  143  142   Potassium 3.5 - 5.1 mmol/L 4.0  3.5  3.5   Chloride 98 - 111 mmol/L 109  107  108   CO2 22 - 32 mmol/L 30  31  28    Calcium 8.9 - 10.3 mg/dL 8.9  9.7  9.2   Total Protein 6.5 - 8.1 g/dL 6.6  6.9  6.9   Total Bilirubin 0.0 - 1.2 mg/dL 0.4  0.3  0.3   Alkaline Phos 38 - 126 U/L 50  52  49   AST 15 - 41 U/L 17  16  16    ALT 0 - 44 U/L 16  16  13      Lab Results  Component Value Date   WBC 4.8 10/20/2023   HGB 13.4 10/20/2023   HCT 38.0 10/20/2023   MCV 86.0 10/20/2023   PLT 320 10/20/2023   NEUTROABS 2.6 10/20/2023    ASSESSMENT & PLAN:  Malignant neoplasm of upper-inner quadrant of right breast in female, 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   Current treatment: Enhertu  cycle 5 Enhertu  toxicities: Nausea: Managed with olanzapine  and Compazine  Tachycardia follows with Dr. Mitzie Anda  Bone metastases: Zometa  every 12 weeks She will receive Zometa  today. 09/22/2023: CT CAP: Decreased size of the left pleural nodules but increased size of previously known index nodule along the posterior left upper lobe pleura.  Mixed  response  Return to clinic in 3 weeks for cycle 6  Assessment & Plan Constipation Chronic constipation persists despite Miralax, Senna, Colace, and Smooth Move tea. More aggressive treatments may be necessary. - Continue Miralax, Senna, and Colace. - Continue Smooth Move tea. - Consider more aggressive treatments if ineffective.      No orders of the defined types were placed in this encounter.  The patient has a good understanding of the overall plan. she agrees with it. she will call with any problems that may develop before the next visit here. Total time spent: 30 mins including face to face time and time spent for planning, charting and co-ordination of care   Margert Sheerer, MD 10/20/23

## 2023-10-20 NOTE — Assessment & Plan Note (Signed)
 stage IV triple positive breast cancer s/p first line THP and then on HP maintenance now with progression andis here to initiate second line with Enhertu .   Current treatment: Enhertu  cycle 5 Enhertu  toxicities: Nausea: Managed with olanzapine  and Compazine  Tachycardia follows with Dr. Mitzie Anda  Bone metastases: Zometa  every 12 weeks 09/22/2023: CT CAP: Decreased size of the left pleural nodules but increased size of previously known index nodule along the posterior left upper lobe pleura.  Mixed response  Return to clinic in 3 weeks for cycle 6

## 2023-10-20 NOTE — Patient Instructions (Signed)

## 2023-11-09 ENCOUNTER — Telehealth: Payer: Self-pay

## 2023-11-09 MED FILL — Fosaprepitant Dimeglumine For IV Infusion 150 MG (Base Eq): INTRAVENOUS | Qty: 5 | Status: AC

## 2023-11-09 NOTE — Telephone Encounter (Signed)
 Verbally confirmed appointments for 5/29

## 2023-11-10 ENCOUNTER — Inpatient Hospital Stay

## 2023-11-10 ENCOUNTER — Inpatient Hospital Stay: Admitting: Hematology and Oncology

## 2023-11-10 ENCOUNTER — Encounter: Payer: Self-pay | Admitting: Hematology and Oncology

## 2023-11-10 VITALS — BP 119/81 | HR 95 | Temp 98.7°F | Resp 17 | Wt 153.9 lb

## 2023-11-10 DIAGNOSIS — Z170 Estrogen receptor positive status [ER+]: Secondary | ICD-10-CM

## 2023-11-10 DIAGNOSIS — C50211 Malignant neoplasm of upper-inner quadrant of right female breast: Secondary | ICD-10-CM | POA: Diagnosis not present

## 2023-11-10 DIAGNOSIS — Z1721 Progesterone receptor positive status: Secondary | ICD-10-CM | POA: Diagnosis not present

## 2023-11-10 DIAGNOSIS — Z1732 Human epidermal growth factor receptor 2 negative status: Secondary | ICD-10-CM | POA: Diagnosis not present

## 2023-11-10 DIAGNOSIS — C7951 Secondary malignant neoplasm of bone: Secondary | ICD-10-CM | POA: Diagnosis not present

## 2023-11-10 DIAGNOSIS — Z5112 Encounter for antineoplastic immunotherapy: Secondary | ICD-10-CM | POA: Diagnosis not present

## 2023-11-10 DIAGNOSIS — Z9013 Acquired absence of bilateral breasts and nipples: Secondary | ICD-10-CM | POA: Diagnosis not present

## 2023-11-10 LAB — CBC WITH DIFFERENTIAL (CANCER CENTER ONLY)
Abs Immature Granulocytes: 0.03 10*3/uL (ref 0.00–0.07)
Basophils Absolute: 0 10*3/uL (ref 0.0–0.1)
Basophils Relative: 1 %
Eosinophils Absolute: 0.2 10*3/uL (ref 0.0–0.5)
Eosinophils Relative: 4 %
HCT: 37.6 % (ref 36.0–46.0)
Hemoglobin: 12.9 g/dL (ref 12.0–15.0)
Immature Granulocytes: 1 %
Lymphocytes Relative: 31 %
Lymphs Abs: 1.3 10*3/uL (ref 0.7–4.0)
MCH: 30.1 pg (ref 26.0–34.0)
MCHC: 34.3 g/dL (ref 30.0–36.0)
MCV: 87.6 fL (ref 80.0–100.0)
Monocytes Absolute: 0.4 10*3/uL (ref 0.1–1.0)
Monocytes Relative: 10 %
Neutro Abs: 2.3 10*3/uL (ref 1.7–7.7)
Neutrophils Relative %: 53 %
Platelet Count: 300 10*3/uL (ref 150–400)
RBC: 4.29 MIL/uL (ref 3.87–5.11)
RDW: 14.3 % (ref 11.5–15.5)
WBC Count: 4.2 10*3/uL (ref 4.0–10.5)
nRBC: 0 % (ref 0.0–0.2)

## 2023-11-10 LAB — CMP (CANCER CENTER ONLY)
ALT: 13 U/L (ref 0–44)
AST: 14 U/L — ABNORMAL LOW (ref 15–41)
Albumin: 4 g/dL (ref 3.5–5.0)
Alkaline Phosphatase: 50 U/L (ref 38–126)
Anion gap: 4 — ABNORMAL LOW (ref 5–15)
BUN: 17 mg/dL (ref 6–20)
CO2: 29 mmol/L (ref 22–32)
Calcium: 9.1 mg/dL (ref 8.9–10.3)
Chloride: 110 mmol/L (ref 98–111)
Creatinine: 0.69 mg/dL (ref 0.44–1.00)
GFR, Estimated: >60 mL/min (ref >60)
Glucose, Bld: 90 mg/dL (ref 70–99)
Potassium: 3.7 mmol/L (ref 3.5–5.1)
Sodium: 143 mmol/L (ref 135–145)
Total Bilirubin: 0.3 mg/dL (ref 0.0–1.2)
Total Protein: 6.5 g/dL (ref 6.5–8.1)

## 2023-11-10 MED ORDER — OLANZAPINE 5 MG PO TABS
5.0000 mg | ORAL_TABLET | Freq: Once | ORAL | Status: AC
  Administered 2023-11-10: 5 mg via ORAL
  Filled 2023-11-10: qty 1

## 2023-11-10 MED ORDER — PALONOSETRON HCL INJECTION 0.25 MG/5ML
0.2500 mg | Freq: Once | INTRAVENOUS | Status: AC
  Administered 2023-11-10: 0.25 mg via INTRAVENOUS
  Filled 2023-11-10: qty 5

## 2023-11-10 MED ORDER — FAM-TRASTUZUMAB DERUXTECAN-NXKI CHEMO 100 MG IV SOLR
5.4000 mg/kg | Freq: Once | INTRAVENOUS | Status: AC
  Administered 2023-11-10: 340 mg via INTRAVENOUS
  Filled 2023-11-10: qty 17

## 2023-11-10 MED ORDER — HEPARIN SOD (PORK) LOCK FLUSH 100 UNIT/ML IV SOLN
500.0000 [IU] | Freq: Once | INTRAVENOUS | Status: AC | PRN
  Administered 2023-11-10: 500 [IU]

## 2023-11-10 MED ORDER — DIPHENHYDRAMINE HCL 25 MG PO CAPS
25.0000 mg | ORAL_CAPSULE | Freq: Once | ORAL | Status: AC
  Administered 2023-11-10: 25 mg via ORAL
  Filled 2023-11-10: qty 1

## 2023-11-10 MED ORDER — DEXAMETHASONE SODIUM PHOSPHATE 10 MG/ML IJ SOLN
10.0000 mg | Freq: Once | INTRAMUSCULAR | Status: AC
  Administered 2023-11-10: 10 mg via INTRAVENOUS
  Filled 2023-11-10: qty 1

## 2023-11-10 MED ORDER — ACETAMINOPHEN 325 MG PO TABS
650.0000 mg | ORAL_TABLET | Freq: Once | ORAL | Status: AC
  Administered 2023-11-10: 650 mg via ORAL
  Filled 2023-11-10: qty 2

## 2023-11-10 MED ORDER — SODIUM CHLORIDE 0.9% FLUSH
10.0000 mL | INTRAVENOUS | Status: DC | PRN
  Administered 2023-11-10: 10 mL

## 2023-11-10 MED ORDER — SODIUM CHLORIDE 0.9 % IV SOLN
150.0000 mg | Freq: Once | INTRAVENOUS | Status: AC
  Administered 2023-11-10: 150 mg via INTRAVENOUS
  Filled 2023-11-10: qty 150

## 2023-11-10 MED ORDER — DEXTROSE 5 % IV SOLN: INTRAVENOUS | Status: DC

## 2023-11-10 MED ORDER — SODIUM CHLORIDE 0.9% FLUSH
10.0000 mL | Freq: Once | INTRAVENOUS | Status: AC
  Administered 2023-11-10: 10 mL

## 2023-11-10 NOTE — Progress Notes (Signed)
 Patient Care Team: Arlys Berke, MD as PCP - General (Family Medicine) Ivery Marking, MD as Consulting Physician (Obstetrics and Gynecology) Murleen Arms, MD as Consulting Physician (Hematology and Oncology)  DIAGNOSIS:  No diagnosis found.   SUMMARY OF ONCOLOGIC HISTORY: Oncology History  Malignant neoplasm of upper-inner quadrant of right breast in female, estrogen receptor positive (HCC)  07/13/2012 Clinical Stage   Stage IA: T1c N0   07/14/2012 Definitive Surgery   Bilateral mastectomy/right SLNB: RIGHT invasive ductal carcinoma, grade 3, ER+, PR +, Her2/neu positive (ratio 3.02), Ki67 48%. DCIS. 1/4 LN positive for malignancy. LEFT: benign   07/14/2012 Pathologic Stage   Stage IIB: T2 N1a M0   07/14/2012 Cancer Staging   Staging form: Breast, AJCC 7th Edition - Pathologic stage from 07/14/2012: Stage IV (TX, NX, M1) - Signed by Murleen Arms, MD on 08/28/2021 Specimen type: Core Needle Biopsy Histopathologic type: 9931 Laterality: Right   08/17/2012 - 08/30/2013 Chemotherapy   Adjuvant carboplatin , docetaxel , and trastuzumab  x 6 cycles (completed 11/30/2012) followed by maintenance trastuzumab  to total one year   11/2012 Procedure   Comp Cancer Gene panel (GeneDx) negative for deleterious mutations    - 02/2013 Radiation Therapy   Adjuvant RT to right breast   02/2013 -  Anti-estrogen oral therapy   Tamoxifen  20 mg daily   09/24/2013 Surgery   Bilateral breast reconstruction with latissmus flap and expander placement   12/12/2013 Surgery   Implant placement    Relapse/Recurrence   Left sided malignant pleural effusion.  Tumor cells are positive for GATA3 and ER, negative for TTF-1 consistent with recurrent breast carcinoma Prognostic showed ER 90% positive strong staining, PR 80% positive strong staining, HER2 negative.    09/16/2021 Imaging   PET scan showed malignant left pleural effusion with extensive areas of nodularity and hypermetabolic activity  involving the mediastinal border and pericardium as well as the most inferior aspects of the costodiaphragmatic recess.  Signs of nodal disease at the thoracic inlet on the left suspect extension of disease below the diaphragm along the left anterolateral aorta.  Nonspecific moderate to markedly increased metabolic activity about the base of the tongue bilaterally.  Consider direct visualization showing mildly asymmetric uptake favoring the right lingual tonsil.  Sclerotic foci without marked increased metabolic activity suspicious for metastatic disease perhaps treated or questions.  Heterogeneous marrow uptake seen elsewhere generalized and nonspecific.  Consider spinal MRI is warranted  MRI brain without any evidence of intracranial metastatic disease.   10/09/2021 - 02/11/2022 Chemotherapy   Taxotere , Herceptin , Perjeta  x 6   12/04/2021 Imaging   There is no evidence new metastatic disease. There is interval decrease in the left pleural effusion possibly suggesting resolving pleural metastatic disease. Few scattered sclerotic metastatic lesions in the skeletal structures have not changed significantly.   No acute findings are seen in the chest abdomen and pelvis.     02/22/2022 -  Antibody Plan   Herceptin /Perjeta  every 3 weeks   03/21/2022 Imaging   CT chest abdomen pelvis  IMPRESSION: 1. No significant change since previous study. Again demonstrated is a chronic small left pleural effusion with basilar atelectasis and scattered sclerotic skeletal metastases. 2. No acute abnormalities are demonstrated. 3. Small esophageal hiatal hernia.     Electronically Signed   By: Boyce Byes M.D.   On: 03/21/2022 20:23   07/22/2023 -  Chemotherapy   Patient is on Treatment Plan : BREAST Fam-Trastuzumab Deruxtecan-nxki  (Enhertu ) (5.4) q21d       CHIEF COMPLIANT: Cycle  6 Enhertu   HISTORY OF PRESENT ILLNESS:   History of Present Illness  Sherry Barry is a 53 year old female  who presents for routine follow-up and treatment.  She recently consulted a podiatrist for ingrown toenails and was advised to undergo a procedure for removal, which would require a short course of antibiotics. She seeks to confirm the timing of this procedure with her current treatment schedule.  Her last scan in April showed mixed results, with some areas improving and one non-indexed lesion showing growth. She experiences fatigue as the main side effect from treatment and notes hair thinning from enhertu  but still present. She denies headaches, double vision, weakness, numbness, balance issues, cough, chest pain, and shortness of breath.  She is currently taking olanzapine  (Zyprexa ) 2.5 mg for nausea and sleep, which she finds beneficial. She mentions gaining some abdominal weight but does not believe it is an overall weight gain.     ALLERGIES:  is allergic to codeine, hydrocodone , latex, lisinopril , and tomato.  MEDICATIONS:  Current Outpatient Medications  Medication Sig Dispense Refill   acetaminophen  (TYLENOL ) 500 MG tablet Take 1,000 mg by mouth every 6 (six) hours as needed for moderate pain.     amLODipine  (NORVASC ) 10 MG tablet Take 1 tablet (10 mg total) by mouth every morning. 30 tablet 3   carvedilol  (COREG ) 3.125 MG tablet TAKE 1 TABLET BY MOUTH TWICE A DAY WITH A MEAL 180 tablet 1   dexamethasone  (DECADRON ) 4 MG tablet Take 2 tablets (8 mg) by mouth daily for 3 days starting the day after chemotherapy. Take with food. 30 tablet 1   KLOR-CON  M20 20 MEQ tablet TAKE 1 TABLET BY MOUTH EVERY DAY 90 tablet 1   lidocaine -prilocaine  (EMLA ) cream Apply 1 Application topically as needed. 30 g 0   OLANZapine  (ZYPREXA ) 2.5 MG tablet TAKE 1 TABLET BY MOUTH AT BEDTIME. 90 tablet 1   ondansetron  (ZOFRAN ) 8 MG tablet Take 1 tablet (8 mg total) by mouth every 8 (eight) hours as needed for nausea or vomiting. Start on the third day after chemotherapy. 30 tablet 1   prochlorperazine  (COMPAZINE )  10 MG tablet Take 1 tablet (10 mg total) by mouth every 6 (six) hours as needed for nausea or vomiting. 30 tablet 1   No current facility-administered medications for this visit.   Facility-Administered Medications Ordered in Other Visits  Medication Dose Route Frequency Provider Last Rate Last Admin   acetaminophen  (TYLENOL ) 325 MG tablet            diphenhydrAMINE  (BENADRYL ) 25 mg capsule             PHYSICAL EXAMINATION: ECOG PERFORMANCE STATUS: 1 - Symptomatic but completely ambulatory  Vitals:   11/10/23 1018 11/10/23 1019  BP: (!) 127/91 119/81  Pulse: 95   Resp: 17   Temp: 98.7 F (37.1 C)   SpO2: 100%    Filed Weights   11/10/23 1018  Weight: 153 lb 14.4 oz (69.8 kg)    Physical Exam Constitutional:      Appearance: Normal appearance.  Cardiovascular:     Rate and Rhythm: Normal rate and regular rhythm.     Pulses: Normal pulses.     Heart sounds: Normal heart sounds.  Pulmonary:     Effort: Pulmonary effort is normal.     Breath sounds: Normal breath sounds.  Abdominal:     General: Abdomen is flat.     Palpations: Abdomen is soft.  Musculoskeletal:  General: No swelling or tenderness. Normal range of motion.     Cervical back: Normal range of motion and neck supple. No rigidity.  Lymphadenopathy:     Cervical: No cervical adenopathy.  Skin:    General: Skin is warm and dry.  Neurological:     Mental Status: She is alert.  Psychiatric:        Mood and Affect: Mood normal.      LABORATORY DATA:  I have reviewed the data as listed    Latest Ref Rng & Units 10/20/2023   10:23 AM 09/29/2023   11:55 AM 09/08/2023    1:58 PM  CMP  Glucose 70 - 99 mg/dL 90  88  86   BUN 6 - 20 mg/dL 11  10  13    Creatinine 0.44 - 1.00 mg/dL 4.03  4.74  2.59   Sodium 135 - 145 mmol/L 143  143  142   Potassium 3.5 - 5.1 mmol/L 4.0  3.5  3.5   Chloride 98 - 111 mmol/L 109  107  108   CO2 22 - 32 mmol/L 30  31  28    Calcium 8.9 - 10.3 mg/dL 8.9  9.7  9.2   Total  Protein 6.5 - 8.1 g/dL 6.6  6.9  6.9   Total Bilirubin 0.0 - 1.2 mg/dL 0.4  0.3  0.3   Alkaline Phos 38 - 126 U/L 50  52  49   AST 15 - 41 U/L 17  16  16    ALT 0 - 44 U/L 16  16  13      Lab Results  Component Value Date   WBC 4.2 11/10/2023   HGB 12.9 11/10/2023   HCT 37.6 11/10/2023   MCV 87.6 11/10/2023   PLT 300 11/10/2023   NEUTROABS 2.3 11/10/2023    ASSESSMENT & PLAN:  Malignant neoplasm of upper-inner quadrant of right breast in female, 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   Current treatment: Enhertu  cycle 5 Enhertu  toxicities: Nausea: Managed with olanzapine  and Compazine  Tachycardia follows with Dr. Mitzie Anda  Bone metastases: Zometa  every 12 weeks She will receive Zometa  today. 09/22/2023: CT CAP: Decreased size of the left pleural nodules but increased size of previously known index nodule along the posterior left upper lobe pleura.  Mixed response  Assessment and Plan Assessment & Plan Metastatic breast cancer No concern for evidence of progression, Will continue enhertu , repeat imaging end of June.  Fatigue Persistent fatigue likely due to treatment side effects. No new neurological symptoms reported. Advise rest, adequate sleep and hydration.  Ingrown toenail Ingrown toenail requires removal. - Coordinate timing of ingrown toenail procedure with chemotherapy cycles, ideally right before treatment when blood counts are near normal.    No orders of the defined types were placed in this encounter.  The patient has a good understanding of the overall plan. she agrees with it. she will call with any problems that may develop before the next visit here. Total time spent: 30 mins including face to face time and time spent for planning, charting and co-ordination of care   Murleen Arms, MD 11/10/23

## 2023-11-10 NOTE — Patient Instructions (Signed)

## 2023-11-12 ENCOUNTER — Other Ambulatory Visit: Payer: Self-pay

## 2023-11-13 ENCOUNTER — Other Ambulatory Visit: Payer: Self-pay

## 2023-11-29 ENCOUNTER — Telehealth: Payer: Self-pay | Admitting: Hematology and Oncology

## 2023-11-29 NOTE — Telephone Encounter (Signed)
 Verbally confirmed appts for 6/19

## 2023-12-01 ENCOUNTER — Inpatient Hospital Stay: Admitting: Hematology and Oncology

## 2023-12-01 ENCOUNTER — Inpatient Hospital Stay: Attending: Hematology and Oncology

## 2023-12-01 ENCOUNTER — Inpatient Hospital Stay

## 2023-12-01 ENCOUNTER — Encounter: Payer: Self-pay | Admitting: Hematology and Oncology

## 2023-12-01 ENCOUNTER — Other Ambulatory Visit: Payer: Self-pay

## 2023-12-01 VITALS — BP 126/84 | HR 99 | Temp 98.3°F | Resp 17 | Wt 157.8 lb

## 2023-12-01 DIAGNOSIS — Z170 Estrogen receptor positive status [ER+]: Secondary | ICD-10-CM

## 2023-12-01 DIAGNOSIS — C50211 Malignant neoplasm of upper-inner quadrant of right female breast: Secondary | ICD-10-CM

## 2023-12-01 DIAGNOSIS — Z79810 Long term (current) use of selective estrogen receptor modulators (SERMs): Secondary | ICD-10-CM | POA: Diagnosis not present

## 2023-12-01 DIAGNOSIS — Z9013 Acquired absence of bilateral breasts and nipples: Secondary | ICD-10-CM | POA: Diagnosis not present

## 2023-12-01 DIAGNOSIS — C7951 Secondary malignant neoplasm of bone: Secondary | ICD-10-CM | POA: Diagnosis not present

## 2023-12-01 DIAGNOSIS — Z79899 Other long term (current) drug therapy: Secondary | ICD-10-CM | POA: Insufficient documentation

## 2023-12-01 DIAGNOSIS — Z5112 Encounter for antineoplastic immunotherapy: Secondary | ICD-10-CM | POA: Insufficient documentation

## 2023-12-01 DIAGNOSIS — Z923 Personal history of irradiation: Secondary | ICD-10-CM | POA: Diagnosis not present

## 2023-12-01 DIAGNOSIS — Z1732 Human epidermal growth factor receptor 2 negative status: Secondary | ICD-10-CM | POA: Diagnosis not present

## 2023-12-01 DIAGNOSIS — Z1721 Progesterone receptor positive status: Secondary | ICD-10-CM | POA: Insufficient documentation

## 2023-12-01 LAB — CBC WITH DIFFERENTIAL (CANCER CENTER ONLY)
Abs Immature Granulocytes: 0.04 10*3/uL (ref 0.00–0.07)
Basophils Absolute: 0.1 10*3/uL (ref 0.0–0.1)
Basophils Relative: 1 %
Eosinophils Absolute: 0.2 10*3/uL (ref 0.0–0.5)
Eosinophils Relative: 4 %
HCT: 38.2 % (ref 36.0–46.0)
Hemoglobin: 13.1 g/dL (ref 12.0–15.0)
Immature Granulocytes: 1 %
Lymphocytes Relative: 32 %
Lymphs Abs: 1.6 10*3/uL (ref 0.7–4.0)
MCH: 30 pg (ref 26.0–34.0)
MCHC: 34.3 g/dL (ref 30.0–36.0)
MCV: 87.6 fL (ref 80.0–100.0)
Monocytes Absolute: 0.5 10*3/uL (ref 0.1–1.0)
Monocytes Relative: 10 %
Neutro Abs: 2.6 10*3/uL (ref 1.7–7.7)
Neutrophils Relative %: 52 %
Platelet Count: 313 10*3/uL (ref 150–400)
RBC: 4.36 MIL/uL (ref 3.87–5.11)
RDW: 13.8 % (ref 11.5–15.5)
WBC Count: 5.1 10*3/uL (ref 4.0–10.5)
nRBC: 0 % (ref 0.0–0.2)

## 2023-12-01 LAB — CMP (CANCER CENTER ONLY)
ALT: 22 U/L (ref 0–44)
AST: 20 U/L (ref 15–41)
Albumin: 4.1 g/dL (ref 3.5–5.0)
Alkaline Phosphatase: 52 U/L (ref 38–126)
Anion gap: 7 (ref 5–15)
BUN: 18 mg/dL (ref 6–20)
CO2: 27 mmol/L (ref 22–32)
Calcium: 9.2 mg/dL (ref 8.9–10.3)
Chloride: 108 mmol/L (ref 98–111)
Creatinine: 0.73 mg/dL (ref 0.44–1.00)
GFR, Estimated: >60 mL/min (ref >60)
Glucose, Bld: 89 mg/dL (ref 70–99)
Potassium: 3.7 mmol/L (ref 3.5–5.1)
Sodium: 142 mmol/L (ref 135–145)
Total Bilirubin: 0.3 mg/dL (ref 0.0–1.2)
Total Protein: 6.6 g/dL (ref 6.5–8.1)

## 2023-12-01 MED ORDER — OLANZAPINE 5 MG PO TABS
5.0000 mg | ORAL_TABLET | Freq: Once | ORAL | Status: AC
  Administered 2023-12-01: 5 mg via ORAL
  Filled 2023-12-01: qty 1

## 2023-12-01 MED ORDER — FOSAPREPITANT DIMEGLUMINE INJECTION 150 MG
150.0000 mg | Freq: Once | INTRAVENOUS | Status: AC
  Administered 2023-12-01: 150 mg via INTRAVENOUS
  Filled 2023-12-01: qty 150

## 2023-12-01 MED ORDER — DEXAMETHASONE SODIUM PHOSPHATE 10 MG/ML IJ SOLN
10.0000 mg | Freq: Once | INTRAMUSCULAR | Status: AC
  Administered 2023-12-01: 10 mg via INTRAVENOUS
  Filled 2023-12-01: qty 1

## 2023-12-01 MED ORDER — HEPARIN SOD (PORK) LOCK FLUSH 100 UNIT/ML IV SOLN: 250.0000 [IU] | Freq: Once | INTRAVENOUS | Status: DC | PRN

## 2023-12-01 MED ORDER — SODIUM CHLORIDE 0.9% FLUSH
3.0000 mL | Freq: Once | INTRAVENOUS | Status: DC | PRN
Start: 2023-12-01 — End: 2023-12-01

## 2023-12-01 MED ORDER — ALTEPLASE 2 MG IJ SOLR
2.0000 mg | Freq: Once | INTRAMUSCULAR | Status: DC | PRN
Start: 2023-12-01 — End: 2023-12-01

## 2023-12-01 MED ORDER — DEXTROSE 5 % IV SOLN: INTRAVENOUS | Status: DC

## 2023-12-01 MED ORDER — HEPARIN SOD (PORK) LOCK FLUSH 100 UNIT/ML IV SOLN
500.0000 [IU] | Freq: Once | INTRAVENOUS | Status: AC | PRN
  Administered 2023-12-01: 500 [IU]

## 2023-12-01 MED ORDER — HEPARIN SOD (PORK) LOCK FLUSH 100 UNIT/ML IV SOLN: 500.0000 [IU] | Freq: Once | INTRAVENOUS | Status: DC | PRN

## 2023-12-01 MED ORDER — ACETAMINOPHEN 325 MG PO TABS
650.0000 mg | ORAL_TABLET | Freq: Once | ORAL | Status: AC
  Administered 2023-12-01: 650 mg via ORAL
  Filled 2023-12-01: qty 2

## 2023-12-01 MED ORDER — PALONOSETRON HCL INJECTION 0.25 MG/5ML
0.2500 mg | Freq: Once | INTRAVENOUS | Status: AC
  Administered 2023-12-01: 0.25 mg via INTRAVENOUS
  Filled 2023-12-01: qty 5

## 2023-12-01 MED ORDER — SODIUM CHLORIDE 0.9% FLUSH
10.0000 mL | Freq: Once | INTRAVENOUS | Status: AC
  Administered 2023-12-01: 10 mL

## 2023-12-01 MED ORDER — DIPHENHYDRAMINE HCL 25 MG PO CAPS
25.0000 mg | ORAL_CAPSULE | Freq: Once | ORAL | Status: AC
  Administered 2023-12-01: 25 mg via ORAL
  Filled 2023-12-01: qty 1

## 2023-12-01 MED ORDER — FAM-TRASTUZUMAB DERUXTECAN-NXKI CHEMO 100 MG IV SOLR
5.4000 mg/kg | Freq: Once | INTRAVENOUS | Status: AC
  Administered 2023-12-01: 340 mg via INTRAVENOUS
  Filled 2023-12-01: qty 17

## 2023-12-01 MED ORDER — SODIUM CHLORIDE 0.9% FLUSH
10.0000 mL | INTRAVENOUS | Status: DC | PRN
  Administered 2023-12-01: 10 mL

## 2023-12-01 MED ORDER — SODIUM CHLORIDE 0.9% FLUSH
10.0000 mL | Freq: Once | INTRAVENOUS | Status: DC | PRN
Start: 2023-12-01 — End: 2023-12-01

## 2023-12-01 NOTE — Progress Notes (Signed)
 Maintain Enhertu  340mg  dose today despite wt increase per Dr. Arno Bibles.  Sherry Barry, PharmD, MBA

## 2023-12-01 NOTE — Progress Notes (Signed)
 Patient Care Team: Arlys Berke, MD as PCP - General (Family Medicine) Ivery Marking, MD as Consulting Physician (Obstetrics and Gynecology) Murleen Arms, MD as Consulting Physician (Hematology and Oncology)  DIAGNOSIS:  No diagnosis found.   SUMMARY OF ONCOLOGIC HISTORY: Oncology History  Malignant neoplasm of upper-inner quadrant of right breast in female, estrogen receptor positive (HCC)  07/13/2012 Clinical Stage   Stage IA: T1c N0   07/14/2012 Definitive Surgery   Bilateral mastectomy/right SLNB: RIGHT invasive ductal carcinoma, grade 3, ER+, PR +, Her2/neu positive (ratio 3.02), Ki67 48%. DCIS. 1/4 LN positive for malignancy. LEFT: benign   07/14/2012 Pathologic Stage   Stage IIB: T2 N1a M0   07/14/2012 Cancer Staging   Staging form: Breast, AJCC 7th Edition - Pathologic stage from 07/14/2012: Stage IV (TX, NX, M1) - Signed by Murleen Arms, MD on 08/28/2021 Specimen type: Core Needle Biopsy Histopathologic type: 9931 Laterality: Right   08/17/2012 - 08/30/2013 Chemotherapy   Adjuvant carboplatin , docetaxel , and trastuzumab  x 6 cycles (completed 11/30/2012) followed by maintenance trastuzumab  to total one year   11/2012 Procedure   Comp Cancer Gene panel ([facility]) negative for deleterious mutations    - 02/2013 Radiation Therapy   Adjuvant RT to right breast   02/2013 -  Anti-estrogen oral therapy   Tamoxifen  20 mg daily   09/24/2013 Surgery   Bilateral breast reconstruction with latissmus flap and expander placement   12/12/2013 Surgery   Implant placement    Relapse/Recurrence   Left sided malignant pleural effusion.  Tumor cells are positive for GATA3 and ER, negative for TTF-1 consistent with recurrent breast carcinoma Prognostic showed ER 90% positive strong staining, PR 80% positive strong staining, HER2 negative.    09/16/2021 Imaging   PET scan showed malignant left pleural effusion with extensive areas of nodularity and hypermetabolic activity  involving the mediastinal border and pericardium as well as the most inferior aspects of the costodiaphragmatic recess.  Signs of nodal disease at the thoracic inlet on the left suspect extension of disease below the diaphragm along the left anterolateral aorta.  Nonspecific moderate to markedly increased metabolic activity about the base of the tongue bilaterally.  Consider direct visualization showing mildly asymmetric uptake favoring the right lingual tonsil.  Sclerotic foci without marked increased metabolic activity suspicious for metastatic disease perhaps treated or questions.  Heterogeneous marrow uptake seen elsewhere generalized and nonspecific.  Consider spinal MRI is warranted  MRI brain without any evidence of intracranial metastatic disease.   10/09/2021 - 02/11/2022 Chemotherapy   Taxotere , Herceptin , Perjeta  x 6   12/04/2021 Imaging   There is no evidence new metastatic disease. There is interval decrease in the left pleural effusion possibly suggesting resolving pleural metastatic disease. Few scattered sclerotic metastatic lesions in the skeletal structures have not changed significantly.   No acute findings are seen in the chest abdomen and pelvis.     02/22/2022 -  Antibody Plan   Herceptin /Perjeta  every 3 weeks   03/21/2022 Imaging   CT chest abdomen pelvis  IMPRESSION: 1. No significant change since previous study. Again demonstrated is a chronic small left pleural effusion with basilar atelectasis and scattered sclerotic skeletal metastases. 2. No acute abnormalities are demonstrated. 3. Small esophageal hiatal hernia.     Electronically Signed   By: Boyce Byes M.D.   On: 03/21/2022 20:23   07/22/2023 -  Chemotherapy   Patient is on Treatment Plan : BREAST Fam-Trastuzumab Deruxtecan-nxki  (Enhertu ) (5.4) q21d       CHIEF COMPLIANT: Cycle  6 Enhertu   HISTORY OF PRESENT ILLNESS:   History of Present Illness  Sherry Barry is a 53 year old female  who presents for routine follow-up and treatment. She is doing really well on enhertu  except fatigue and nausea. No cough, SOB, chest pain, chest pressure and edema. Rest of the pertinent 10 point ROS reviewed and neg.     ALLERGIES:  is allergic to codeine, hydrocodone , latex, lisinopril , and tomato.  MEDICATIONS:  Current Outpatient Medications  Medication Sig Dispense Refill   acetaminophen  (TYLENOL ) 500 MG tablet Take 1,000 mg by mouth every 6 (six) hours as needed for moderate pain.     amLODipine  (NORVASC ) 10 MG tablet Take 1 tablet (10 mg total) by mouth every morning. 30 tablet 3   carvedilol  (COREG ) 3.125 MG tablet TAKE 1 TABLET BY MOUTH TWICE A DAY WITH A MEAL 180 tablet 1   dexamethasone  (DECADRON ) 4 MG tablet Take 2 tablets (8 mg) by mouth daily for 3 days starting the day after chemotherapy. Take with food. 30 tablet 1   KLOR-CON  M20 20 MEQ tablet TAKE 1 TABLET BY MOUTH EVERY DAY 90 tablet 1   lidocaine -prilocaine  (EMLA ) cream Apply 1 Application topically as needed. 30 g 0   OLANZapine  (ZYPREXA ) 2.5 MG tablet TAKE 1 TABLET BY MOUTH AT BEDTIME. 90 tablet 1   ondansetron  (ZOFRAN ) 8 MG tablet Take 1 tablet (8 mg total) by mouth every 8 (eight) hours as needed for nausea or vomiting. Start on the third day after chemotherapy. 30 tablet 1   prochlorperazine  (COMPAZINE ) 10 MG tablet Take 1 tablet (10 mg total) by mouth every 6 (six) hours as needed for nausea or vomiting. 30 tablet 1   No current facility-administered medications for this visit.   Facility-Administered Medications Ordered in Other Visits  Medication Dose Route Frequency Provider Last Rate Last Admin   acetaminophen  (TYLENOL ) 325 MG tablet            diphenhydrAMINE  (BENADRYL ) 25 mg capsule             PHYSICAL EXAMINATION: ECOG PERFORMANCE STATUS: 1 - Symptomatic but completely ambulatory  Vitals:   12/01/23 1420 12/01/23 1421  BP: (!) 128/92 126/84  Pulse: 99   Resp: 17   Temp: 98.3 F (36.8 C)   SpO2:  100%    Filed Weights   12/01/23 1420  Weight: 157 lb 12.8 oz (71.6 kg)    Physical Exam Constitutional:      Appearance: Normal appearance.   Cardiovascular:     Rate and Rhythm: Normal rate and regular rhythm.     Pulses: Normal pulses.     Heart sounds: Normal heart sounds.  Pulmonary:     Effort: Pulmonary effort is normal.     Breath sounds: Normal breath sounds.  Abdominal:     General: Abdomen is flat.     Palpations: Abdomen is soft.   Musculoskeletal:        General: No swelling or tenderness. Normal range of motion.     Cervical back: Normal range of motion and neck supple. No rigidity.  Lymphadenopathy:     Cervical: No cervical adenopathy.   Skin:    General: Skin is warm and dry.   Neurological:     Mental Status: She is alert.   Psychiatric:        Mood and Affect: Mood normal.      LABORATORY DATA:  I have reviewed the data as listed    Latest Ref Rng &  Units 12/01/2023    1:41 PM 11/10/2023    9:57 AM 10/20/2023   10:23 AM  CMP  Glucose 70 - 99 mg/dL 89  90  90   BUN 6 - 20 mg/dL 18  17  11    Creatinine 0.44 - 1.00 mg/dL 7.84  6.96  2.95   Sodium 135 - 145 mmol/L 142  143  143   Potassium 3.5 - 5.1 mmol/L 3.7  3.7  4.0   Chloride 98 - 111 mmol/L 108  110  109   CO2 22 - 32 mmol/L 27  29  30    Calcium 8.9 - 10.3 mg/dL 9.2  9.1  8.9   Total Protein 6.5 - 8.1 g/dL 6.6  6.5  6.6   Total Bilirubin 0.0 - 1.2 mg/dL 0.3  0.3  0.4   Alkaline Phos 38 - 126 U/L 52  50  50   AST 15 - 41 U/L 20  14  17    ALT 0 - 44 U/L 22  13  16      Lab Results  Component Value Date   WBC 5.1 12/01/2023   HGB 13.1 12/01/2023   HCT 38.2 12/01/2023   MCV 87.6 12/01/2023   PLT 313 12/01/2023   NEUTROABS 2.6 12/01/2023    ASSESSMENT & PLAN:  Malignant neoplasm of upper-inner quadrant of right breast in female, 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   Current treatment: Enhertu  cycle 5 Enhertu  toxicities: Nausea: Managed with olanzapine  and Compazine  Tachycardia follows with Dr. Mitzie Anda  Bone metastases: Zometa  every 12 weeks She will receive Zometa  today. 09/22/2023: CT CAP: Decreased size of the left pleural nodules but increased size of previously known index nodule along the posterior left upper lobe pleura.  Mixed response  Assessment and Plan Assessment & Plan Metastatic breast cancer No concern for evidence of progression, Will continue enhertu , repeat imaging end of June.  Fatigue Persistent fatigue likely due to treatment side effects. No new neurological symptoms reported. Advise rest, adequate sleep and hydration.  Chemotherapy induced nausea Continue zyprexa . Labs reviewed and satisfactory.   No orders of the defined types were placed in this encounter.  The patient has a good understanding of the overall plan. she agrees with it. she will call with any problems that may develop before the next visit here. Total time spent: 20 mins including face to face time and time spent for planning, charting and co-ordination of care   Murleen Arms, MD 12/01/23

## 2023-12-09 ENCOUNTER — Inpatient Hospital Stay (HOSPITAL_COMMUNITY)
Admission: RE | Admit: 2023-12-09 | Discharge: 2023-12-09 | Disposition: A | Discharge status: Home / Self Care | Source: Ambulatory Visit | Attending: Hematology and Oncology | Admitting: Hematology and Oncology

## 2023-12-09 DIAGNOSIS — C50211 Malignant neoplasm of upper-inner quadrant of right female breast: Secondary | ICD-10-CM | POA: Diagnosis not present

## 2023-12-09 DIAGNOSIS — K55059 Acute (reversible) ischemia of intestine, part and extent unspecified: Secondary | ICD-10-CM | POA: Diagnosis not present

## 2023-12-09 DIAGNOSIS — Z808 Family history of malignant neoplasm of other organs or systems: Secondary | ICD-10-CM | POA: Diagnosis not present

## 2023-12-09 DIAGNOSIS — Z9221 Personal history of antineoplastic chemotherapy: Secondary | ICD-10-CM | POA: Diagnosis not present

## 2023-12-09 DIAGNOSIS — Z170 Estrogen receptor positive status [ER+]: Secondary | ICD-10-CM | POA: Insufficient documentation

## 2023-12-09 DIAGNOSIS — Z9013 Acquired absence of bilateral breasts and nipples: Secondary | ICD-10-CM | POA: Diagnosis not present

## 2023-12-09 DIAGNOSIS — E876 Hypokalemia: Secondary | ICD-10-CM | POA: Diagnosis not present

## 2023-12-09 DIAGNOSIS — I10 Essential (primary) hypertension: Secondary | ICD-10-CM | POA: Diagnosis not present

## 2023-12-09 DIAGNOSIS — Z823 Family history of stroke: Secondary | ICD-10-CM | POA: Diagnosis not present

## 2023-12-09 DIAGNOSIS — Z79810 Long term (current) use of selective estrogen receptor modulators (SERMs): Secondary | ICD-10-CM | POA: Diagnosis not present

## 2023-12-09 DIAGNOSIS — Z800 Family history of malignant neoplasm of digestive organs: Secondary | ICD-10-CM | POA: Diagnosis not present

## 2023-12-09 DIAGNOSIS — K5900 Constipation, unspecified: Secondary | ICD-10-CM | POA: Diagnosis not present

## 2023-12-09 DIAGNOSIS — R1013 Epigastric pain: Secondary | ICD-10-CM | POA: Diagnosis not present

## 2023-12-09 DIAGNOSIS — Z803 Family history of malignant neoplasm of breast: Secondary | ICD-10-CM | POA: Diagnosis not present

## 2023-12-09 DIAGNOSIS — Z811 Family history of alcohol abuse and dependence: Secondary | ICD-10-CM | POA: Diagnosis not present

## 2023-12-09 DIAGNOSIS — I81 Portal vein thrombosis: Secondary | ICD-10-CM | POA: Diagnosis not present

## 2023-12-09 DIAGNOSIS — Z923 Personal history of irradiation: Secondary | ICD-10-CM | POA: Diagnosis not present

## 2023-12-09 DIAGNOSIS — Z9882 Breast implant status: Secondary | ICD-10-CM | POA: Diagnosis not present

## 2023-12-09 DIAGNOSIS — Z807 Family history of other malignant neoplasms of lymphoid, hematopoietic and related tissues: Secondary | ICD-10-CM | POA: Diagnosis not present

## 2023-12-09 DIAGNOSIS — Z8249 Family history of ischemic heart disease and other diseases of the circulatory system: Secondary | ICD-10-CM | POA: Diagnosis not present

## 2023-12-09 DIAGNOSIS — C7951 Secondary malignant neoplasm of bone: Secondary | ICD-10-CM | POA: Diagnosis not present

## 2023-12-09 DIAGNOSIS — Z90710 Acquired absence of both cervix and uterus: Secondary | ICD-10-CM | POA: Diagnosis not present

## 2023-12-09 DIAGNOSIS — R918 Other nonspecific abnormal finding of lung field: Secondary | ICD-10-CM | POA: Diagnosis not present

## 2023-12-09 DIAGNOSIS — Z885 Allergy status to narcotic agent status: Secondary | ICD-10-CM | POA: Diagnosis not present

## 2023-12-09 DIAGNOSIS — J90 Pleural effusion, not elsewhere classified: Secondary | ICD-10-CM | POA: Diagnosis not present

## 2023-12-09 MED ORDER — IOHEXOL 300 MG/ML  SOLN
100.0000 mL | Freq: Once | INTRAMUSCULAR | Status: AC | PRN
  Administered 2023-12-09: 100 mL via INTRAVENOUS

## 2023-12-11 ENCOUNTER — Telehealth: Payer: Self-pay

## 2023-12-11 ENCOUNTER — Other Ambulatory Visit: Payer: Self-pay

## 2023-12-11 ENCOUNTER — Inpatient Hospital Stay (HOSPITAL_COMMUNITY)
Admission: EM | Admit: 2023-12-11 | Discharge: 2023-12-12 | DRG: 441 | Disposition: A | Discharge status: Home / Self Care | Source: Ambulatory Visit | Attending: Internal Medicine | Admitting: Internal Medicine

## 2023-12-11 ENCOUNTER — Encounter (HOSPITAL_COMMUNITY): Payer: Self-pay | Admitting: Internal Medicine

## 2023-12-11 DIAGNOSIS — I81 Portal vein thrombosis: Secondary | ICD-10-CM | POA: Diagnosis not present

## 2023-12-11 DIAGNOSIS — K55069 Acute infarction of intestine, part and extent unspecified: Secondary | ICD-10-CM | POA: Diagnosis not present

## 2023-12-11 DIAGNOSIS — Z9013 Acquired absence of bilateral breasts and nipples: Secondary | ICD-10-CM | POA: Diagnosis not present

## 2023-12-11 DIAGNOSIS — E876 Hypokalemia: Secondary | ICD-10-CM | POA: Diagnosis present

## 2023-12-11 DIAGNOSIS — Z90710 Acquired absence of both cervix and uterus: Secondary | ICD-10-CM

## 2023-12-11 DIAGNOSIS — C7951 Secondary malignant neoplasm of bone: Secondary | ICD-10-CM | POA: Diagnosis present

## 2023-12-11 DIAGNOSIS — Z9882 Breast implant status: Secondary | ICD-10-CM

## 2023-12-11 DIAGNOSIS — K5900 Constipation, unspecified: Secondary | ICD-10-CM | POA: Diagnosis present

## 2023-12-11 DIAGNOSIS — Z888 Allergy status to other drugs, medicaments and biological substances status: Secondary | ICD-10-CM

## 2023-12-11 DIAGNOSIS — Z800 Family history of malignant neoplasm of digestive organs: Secondary | ICD-10-CM

## 2023-12-11 DIAGNOSIS — K55059 Acute (reversible) ischemia of intestine, part and extent unspecified: Secondary | ICD-10-CM | POA: Diagnosis present

## 2023-12-11 DIAGNOSIS — Z808 Family history of malignant neoplasm of other organs or systems: Secondary | ICD-10-CM | POA: Diagnosis not present

## 2023-12-11 DIAGNOSIS — R1013 Epigastric pain: Secondary | ICD-10-CM | POA: Diagnosis present

## 2023-12-11 DIAGNOSIS — Z923 Personal history of irradiation: Secondary | ICD-10-CM

## 2023-12-11 DIAGNOSIS — J90 Pleural effusion, not elsewhere classified: Secondary | ICD-10-CM | POA: Diagnosis present

## 2023-12-11 DIAGNOSIS — Z8249 Family history of ischemic heart disease and other diseases of the circulatory system: Secondary | ICD-10-CM | POA: Diagnosis not present

## 2023-12-11 DIAGNOSIS — Z823 Family history of stroke: Secondary | ICD-10-CM

## 2023-12-11 DIAGNOSIS — Z885 Allergy status to narcotic agent status: Secondary | ICD-10-CM | POA: Diagnosis not present

## 2023-12-11 DIAGNOSIS — C50211 Malignant neoplasm of upper-inner quadrant of right female breast: Secondary | ICD-10-CM | POA: Diagnosis present

## 2023-12-11 DIAGNOSIS — Z803 Family history of malignant neoplasm of breast: Secondary | ICD-10-CM

## 2023-12-11 DIAGNOSIS — Z7901 Long term (current) use of anticoagulants: Secondary | ICD-10-CM

## 2023-12-11 DIAGNOSIS — Z79899 Other long term (current) drug therapy: Secondary | ICD-10-CM

## 2023-12-11 DIAGNOSIS — Z79810 Long term (current) use of selective estrogen receptor modulators (SERMs): Secondary | ICD-10-CM

## 2023-12-11 DIAGNOSIS — Z807 Family history of other malignant neoplasms of lymphoid, hematopoietic and related tissues: Secondary | ICD-10-CM | POA: Diagnosis not present

## 2023-12-11 DIAGNOSIS — Z91040 Latex allergy status: Secondary | ICD-10-CM

## 2023-12-11 DIAGNOSIS — I10 Essential (primary) hypertension: Secondary | ICD-10-CM | POA: Diagnosis present

## 2023-12-11 DIAGNOSIS — Z91018 Allergy to other foods: Secondary | ICD-10-CM

## 2023-12-11 DIAGNOSIS — Z811 Family history of alcohol abuse and dependence: Secondary | ICD-10-CM

## 2023-12-11 DIAGNOSIS — Z9221 Personal history of antineoplastic chemotherapy: Secondary | ICD-10-CM

## 2023-12-11 DIAGNOSIS — Z853 Personal history of malignant neoplasm of breast: Secondary | ICD-10-CM

## 2023-12-11 DIAGNOSIS — Z170 Estrogen receptor positive status [ER+]: Secondary | ICD-10-CM

## 2023-12-11 DIAGNOSIS — R5383 Other fatigue: Secondary | ICD-10-CM | POA: Diagnosis present

## 2023-12-11 LAB — CBC WITH DIFFERENTIAL/PLATELET
Abs Immature Granulocytes: 0.1 10*3/uL — ABNORMAL HIGH (ref 0.00–0.07)
Basophils Absolute: 0 10*3/uL (ref 0.0–0.1)
Basophils Relative: 0 %
Eosinophils Absolute: 0.1 10*3/uL (ref 0.0–0.5)
Eosinophils Relative: 1 %
HCT: 34.8 % — ABNORMAL LOW (ref 36.0–46.0)
Hemoglobin: 11.8 g/dL — ABNORMAL LOW (ref 12.0–15.0)
Immature Granulocytes: 1 %
Lymphocytes Relative: 14 %
Lymphs Abs: 1.5 10*3/uL (ref 0.7–4.0)
MCH: 30.4 pg (ref 26.0–34.0)
MCHC: 33.9 g/dL (ref 30.0–36.0)
MCV: 89.7 fL (ref 80.0–100.0)
Monocytes Absolute: 0.8 10*3/uL (ref 0.1–1.0)
Monocytes Relative: 8 %
Neutro Abs: 8.1 10*3/uL — ABNORMAL HIGH (ref 1.7–7.7)
Neutrophils Relative %: 76 %
Platelets: 166 10*3/uL (ref 150–400)
RBC: 3.88 MIL/uL (ref 3.87–5.11)
RDW: 13 % (ref 11.5–15.5)
WBC: 10.7 10*3/uL — ABNORMAL HIGH (ref 4.0–10.5)
nRBC: 0 % (ref 0.0–0.2)

## 2023-12-11 LAB — BASIC METABOLIC PANEL WITH GFR
Anion gap: 9 (ref 5–15)
BUN: 9 mg/dL (ref 6–20)
CO2: 22 mmol/L (ref 22–32)
Calcium: 8.7 mg/dL — ABNORMAL LOW (ref 8.9–10.3)
Chloride: 107 mmol/L (ref 98–111)
Creatinine, Ser: 0.75 mg/dL (ref 0.44–1.00)
GFR, Estimated: >60 mL/min (ref >60)
Glucose, Bld: 120 mg/dL — ABNORMAL HIGH (ref 70–99)
Potassium: 3.2 mmol/L — ABNORMAL LOW (ref 3.5–5.1)
Sodium: 138 mmol/L (ref 135–145)

## 2023-12-11 LAB — HEPATIC FUNCTION PANEL
ALT: 22 U/L (ref 0–44)
AST: 19 U/L (ref 15–41)
Albumin: 3.2 g/dL — ABNORMAL LOW (ref 3.5–5.0)
Alkaline Phosphatase: 51 U/L (ref 38–126)
Bilirubin, Direct: <0.1 mg/dL (ref 0.0–0.2)
Total Bilirubin: 0.6 mg/dL (ref 0.0–1.2)
Total Protein: 6.4 g/dL — ABNORMAL LOW (ref 6.5–8.1)

## 2023-12-11 LAB — HEPARIN LEVEL (UNFRACTIONATED): Heparin Unfractionated: 0.9 [IU]/mL — ABNORMAL HIGH (ref 0.30–0.70)

## 2023-12-11 LAB — HIV ANTIBODY (ROUTINE TESTING W REFLEX): HIV Screen 4th Generation wRfx: NONREACTIVE

## 2023-12-11 MED ORDER — POLYETHYLENE GLYCOL 3350 17 G PO PACK: 17.0000 g | PACK | Freq: Every day | ORAL | Status: DC | PRN

## 2023-12-11 MED ORDER — POTASSIUM CHLORIDE CRYS ER 20 MEQ PO TBCR
40.0000 meq | EXTENDED_RELEASE_TABLET | Freq: Once | ORAL | Status: AC
  Administered 2023-12-11: 40 meq via ORAL
  Filled 2023-12-11: qty 2

## 2023-12-11 MED ORDER — TAMOXIFEN CITRATE 10 MG PO TABS: 20.0000 mg | ORAL_TABLET | Freq: Every day | ORAL | Status: DC

## 2023-12-11 MED ORDER — HYDROMORPHONE HCL 1 MG/ML IJ SOLN: 0.5000 mg | INTRAMUSCULAR | Status: DC | PRN

## 2023-12-11 MED ORDER — ACETAMINOPHEN 500 MG PO TABS
1000.0000 mg | ORAL_TABLET | Freq: Four times a day (QID) | ORAL | Status: DC | PRN
  Administered 2023-12-12: 1000 mg via ORAL
  Filled 2023-12-11: qty 2

## 2023-12-11 MED ORDER — BISACODYL 10 MG RE SUPP
10.0000 mg | Freq: Once | RECTAL | Status: AC
  Administered 2023-12-11: 10 mg via RECTAL
  Filled 2023-12-11: qty 1

## 2023-12-11 MED ORDER — ONDANSETRON HCL 4 MG/2ML IJ SOLN: 4.0000 mg | Freq: Four times a day (QID) | INTRAMUSCULAR | Status: DC | PRN

## 2023-12-11 MED ORDER — SODIUM CHLORIDE 0.9% FLUSH
10.0000 mL | Freq: Two times a day (BID) | INTRAVENOUS | Status: DC
  Administered 2023-12-12: 10 mL

## 2023-12-11 MED ORDER — HEPARIN (PORCINE) 25000 UT/250ML-% IV SOLN
950.0000 [IU]/h | INTRAVENOUS | Status: DC
  Administered 2023-12-11 – 2023-12-12 (×2): 1100 [IU]/h via INTRAVENOUS
  Filled 2023-12-11 (×2): qty 250

## 2023-12-11 MED ORDER — ONDANSETRON HCL 4 MG PO TABS: 4.0000 mg | ORAL_TABLET | Freq: Four times a day (QID) | ORAL | Status: DC | PRN

## 2023-12-11 MED ORDER — SENNOSIDES-DOCUSATE SODIUM 8.6-50 MG PO TABS
1.0000 | ORAL_TABLET | Freq: Two times a day (BID) | ORAL | Status: DC
  Administered 2023-12-11 – 2023-12-12 (×3): 1 via ORAL
  Filled 2023-12-11 (×3): qty 1

## 2023-12-11 MED ORDER — CHLORHEXIDINE GLUCONATE CLOTH 2 % EX PADS
6.0000 | MEDICATED_PAD | Freq: Every day | CUTANEOUS | Status: DC
  Administered 2023-12-11 – 2023-12-12 (×2): 6 via TOPICAL

## 2023-12-11 MED ORDER — VITAMIN D 25 MCG (1000 UNIT) PO TABS
1000.0000 [IU] | ORAL_TABLET | Freq: Every day | ORAL | Status: DC
  Administered 2023-12-12: 1000 [IU] via ORAL
  Filled 2023-12-11: qty 1

## 2023-12-11 MED ORDER — POLYETHYLENE GLYCOL 3350 17 G PO PACK
17.0000 g | PACK | Freq: Every day | ORAL | Status: DC
  Administered 2023-12-11 – 2023-12-12 (×2): 17 g via ORAL
  Filled 2023-12-11 (×2): qty 1

## 2023-12-11 MED ORDER — HEPARIN BOLUS VIA INFUSION
5000.0000 [IU] | Freq: Once | INTRAVENOUS | Status: AC
  Administered 2023-12-11: 5000 [IU] via INTRAVENOUS
  Filled 2023-12-11: qty 5000

## 2023-12-11 MED ORDER — SODIUM CHLORIDE 0.9% FLUSH: 10.0000 mL | INTRAVENOUS | Status: DC | PRN

## 2023-12-11 NOTE — Assessment & Plan Note (Addendum)
 Acute thrombosis with pain. IV heparin  was initiated Continue and may transition to apixaban twice daily once clinically with improvement of symptoms

## 2023-12-11 NOTE — Consult Note (Signed)
 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  Patient has a 1 week history of epigastric pain.  Restaging imaging for breast cancer showed new extensive thrombosis of SMV, main portal vein and intrahepatic portal venous system.  Patient was informed to report to the emergency room this morning.  Anticoagulation with heparin  has been initiated.  Currently stable.  Discussed with patient this is treatable.  Will need to continue anticoagulation.  Discussed anticoagulation precautions.  Of note, malignancy has been responding to her treatment.  That is good news. Assessment & Plan Mesenteric vein thrombosis (HCC) Acute thrombosis with pain. IV heparin  was initiated Continue and may transition to apixaban twice daily once clinically with improvement of symptoms Malignant neoplasm of upper-inner quadrant of right breast in female, estrogen receptor positive (HCC) Interval response on CT scan from Enhertu . This can be follow-up long-term as outpatient.  All questions were answered.   I will inform her primary oncologist Dr. Loretha to follow-up tomorrow.   Pauletta JAYSON Chihuahua, MD 12/11/2023 2:17 PM   CHIEF COMPLAINTS/PURPOSE OF ADMISSION Acute mesenteric vein thrombosis  HISTORY OF PRESENTING ILLNESS:  Sherry Barry 53 y.o. female consulted for acute thrombosis. Patient is admitted for abdominal pain from acute thrombosis.  Patient has history of known stage IV breast cancer currently on Enhertu .  Received a call early morning of stat CT result.  1. New, extensive thrombosis of the superior  mesenteric vein, main portal vein, and intrahepatic portal venous system. Mild, early variceal collateralization about the left upper quadrant. 2. Diminished size of multiple left-sided pleural nodules and trace, residual loculated left pleural effusion, consistent with treatment response of pleural metastatic disease. 3. Unchanged sclerotic vertebral body metastases. 4. No new evidence of metastatic disease in the chest, abdomen, or pelvis. 5. Status post bilateral mastectomy with implant reconstruction.  Called and spoke to patient and reported persistent abdominal pain over the past week.  Pain has not improved.  Recommend patient proceed to ED for evaluation and start anticoagulation.   Summary of oncologic history as follows: Oncology History  Malignant neoplasm of upper-inner quadrant of right breast in female, estrogen receptor positive (HCC)  07/13/2012 Clinical Stage   Stage IA: T1c N0   07/14/2012 Definitive Surgery   Bilateral mastectomy/right SLNB: RIGHT invasive ductal carcinoma, grade 3, ER+, PR +, Her2/neu positive (ratio 3.02), Ki67 48%. DCIS. 1/4 LN positive for malignancy. LEFT: benign   07/14/2012 Pathologic Stage   Stage IIB: T2 N1a M0   07/14/2012 Cancer Staging   Staging form: Breast, AJCC 7th Edition - Pathologic stage from 07/14/2012: Stage IV (TX, NX, M1) - Signed by [name], MD on 08/28/2021 Specimen type: Core Needle Biopsy Histopathologic type: 9931 Laterality: Right   08/17/2012 - 08/30/2013 Chemotherapy   Adjuvant carboplatin , docetaxel , and trastuzumab  x 6 cycles (completed 11/30/2012) followed by maintenance trastuzumab  to total one year   11/2012 Procedure   Comp Cancer Gene panel (GeneDx) negative for deleterious mutations    - 02/2013 Radiation Therapy   Adjuvant RT to right breast   02/2013 -  Anti-estrogen oral therapy   Tamoxifen  20 mg daily   09/24/2013 Surgery   Bilateral  breast reconstruction with latissmus flap and expander placement    12/12/2013 Surgery   Implant placement    Relapse/Recurrence   Left sided malignant pleural effusion.  Tumor cells are positive for GATA3 and ER, negative for TTF-1 consistent with recurrent breast carcinoma Prognostic showed ER 90% positive strong staining, PR 80% positive strong staining, HER2 negative.    09/16/2021 Imaging   PET scan showed malignant left pleural effusion with extensive areas of nodularity and hypermetabolic activity involving the mediastinal border and pericardium as well as the most inferior aspects of the costodiaphragmatic recess.  Signs of nodal disease at the thoracic inlet on the left suspect extension of disease below the diaphragm along the left anterolateral aorta.  Nonspecific moderate to markedly increased metabolic activity about the base of the tongue bilaterally.  Consider direct visualization showing mildly asymmetric uptake favoring the right lingual tonsil.  Sclerotic foci without marked increased metabolic activity suspicious for metastatic disease perhaps treated or questions.  Heterogeneous marrow uptake seen elsewhere generalized and nonspecific.  Consider spinal MRI is warranted  MRI brain without any evidence of intracranial metastatic disease.   10/09/2021 - 02/11/2022 Chemotherapy   Taxotere , Herceptin , Perjeta  x 6   12/04/2021 Imaging   There is no evidence new metastatic disease. There is interval decrease in the left pleural effusion possibly suggesting resolving pleural metastatic disease. Few scattered sclerotic metastatic lesions in the skeletal structures have not changed significantly.   No acute findings are seen in the chest abdomen and pelvis.     02/22/2022 -  Antibody Plan   Herceptin /Perjeta  every 3 weeks   03/21/2022 Imaging   CT chest abdomen pelvis  IMPRESSION: 1. No significant change since previous study. Again demonstrated is a chronic small left pleural effusion with basilar atelectasis and scattered sclerotic skeletal  metastases. 2. No acute abnormalities are demonstrated. 3. Small esophageal hiatal hernia.     Electronically Signed   By: [name] M.D.   On: 03/21/2022 20:23   07/22/2023 -  Chemotherapy   Patient is on Treatment Plan : BREAST Fam-Trastuzumab Deruxtecan-nxki  (Enhertu ) (5.4) q21d       MEDICAL HISTORY:  Past Medical History:  Diagnosis Date   Allergy    Breast cancer (HCC)    Breast cancer (HCC)    right   History of chemotherapy    doxetaxel/carboplatin /trastuzumab    History of migraine    last one about a week ago   Hx of radiation therapy 01/01/13- 02/15/13   r chest wall, r supraclav/axilla 5040 cGy/28 sessions, scar boost 1000 cGy/5 sessions   Hypertension    no meds,   urgent care on pomana   Migraine    Migraine     SURGICAL HISTORY: Past Surgical History:  Procedure Laterality Date   ABDOMINAL HYSTERECTOMY     no salpingo-oophorectomy 2009   BREAST RECONSTRUCTION WITH PLACEMENT OF TISSUE EXPANDER AND FLEX HD (ACELLULAR HYDRATED DERMIS) Left 09/24/2013   IR IMAGING GUIDED PORT INSERTION  10/07/2021   IR THORACENTESIS ASP PLEURAL SPACE W/IMG GUIDE  08/11/2021   IR THORACENTESIS ASP PLEURAL SPACE W/IMG GUIDE  10/07/2021   LATISSIMUS FLAP TO BREAST Right 09/24/2013   Procedure: RIGHT LATISSMUS MYOCUTAEIOUS MUSCLE FLAP AND PLACEMENT OF TISSUE LESLEIGH;  Surgeon: Estefana Reichert, DO;  Location: [facility];  Service: Plastics;  Laterality: Right;   LIPOSUCTION WITH LIPOFILLING Bilateral 12/12/2013   Procedure: LIPOSUCTION WITH LIPOFILLING;  Surgeon: Estefana Reichert, DO;  Location: [facility];  Service: Plastics;  Laterality: Bilateral;  PORT-A-CATH REMOVAL Left 12/12/2013   Procedure: REMOVAL PORT-A-CATH;  Surgeon: Estefana Reichert, DO;  Location: [facility];  Service: Plastics;  Laterality: Left;   PORTACATH PLACEMENT  07/14/2012   Procedure: INSERTION PORT-A-CATH;  Surgeon: Debby LABOR. Cornett, MD;  Location: [facility];  Service: General;  Laterality: Left;    RECONSTRUCTION BREAST W/ LATISSIMUS DORSI FLAP Right 09/24/2013   & tissue expander placement   REMOVAL OF BILATERAL TISSUE EXPANDERS WITH PLACEMENT OF BILATERAL BREAST IMPLANTS Bilateral 12/12/2013   Procedure: REMOVAL OF BILATERAL TISSUE EXPANDERS WITH PLACEMENT OF BILATERAL BREAST IMPLANTS/BILATERAL CAPSULECTOMIES WITH  LIPOFILLING FAT GRAFTING;  Surgeon: Estefana Reichert, DO;  Location: [facility];  Service: Plastics;  Laterality: Bilateral;   SIMPLE MASTECTOMY WITH AXILLARY SENTINEL NODE BIOPSY  07/14/2012   Procedure: SIMPLE MASTECTOMY WITH AXILLARY SENTINEL NODE BIOPSY;  Surgeon: Debby LABOR. Cornett, MD;  Location: [facility];  Service: General;  Laterality: Right;  Bilateral simple mastectomy with port and right sebtibel lymph node mapping   SIMPLE MASTECTOMY WITH AXILLARY SENTINEL NODE BIOPSY  07/14/2012   Procedure: SIMPLE MASTECTOMY;  Surgeon: Debby LABOR. Cornett, MD;  Location: [facility];  Service: General;  Laterality: Left;   TISSUE EXPANDER PLACEMENT Left 09/24/2013   Procedure: PLACEMENT OF TISSUE EXPANDER AND FLEX HD TO LEFT BREAST;  Surgeon: Estefana Reichert, DO;  Location: [facility];  Service: Plastics;  Laterality: Left;    SOCIAL HISTORY: Social History   Socioeconomic History   Marital status: Married    Spouse name: Not on file   Number of children: 2   Years of education: Not on file   Highest education level: Not on file  Occupational History    Employer: [employer]  Tobacco Use   Smoking status: Never   Smokeless tobacco: Never  Substance and Sexual Activity   Alcohol use: Yes    Comment: occasional   Drug use: No   Sexual activity: Yes    Birth control/protection: Surgical  Other Topics Concern   Not on file  Social History Narrative   Not on file   Social Drivers of Health   Financial Resource Strain: Not on file  Food Insecurity: Not on file  Transportation Needs: Not on file  Physical Activity: Not on file  Stress: Not on file  Social Connections:  Not on file  Intimate Partner Violence: Not on file    FAMILY HISTORY: Family History  Problem Relation Age of Onset   Hypertension Mother    Alcohol abuse Mother    Heart disease Maternal Grandmother    Stroke Maternal Grandfather    Colon cancer Maternal Aunt 55       alive at 5   Brain cancer Maternal Uncle 22       and lymphoma in early 62s; deceased   Brain cancer Maternal Uncle 60       deceased   Pancreatic cancer Maternal Uncle 45       alive at 67   Breast cancer Cousin 65       mat 1st cousin once removed through mat GF ; deceased   Breast cancer Maternal Aunt        great aunt through mat GF; dx at ? age    ALLERGIES:  is allergic to codeine, hydrocodone , latex, lisinopril , and tomato.  MEDICATIONS:  Current Facility-Administered Medications  Medication Dose Route Frequency Provider Last Rate Last Admin   acetaminophen  (TYLENOL ) tablet 1,000 mg  1,000 mg Oral Q6H PRN Jillian Buttery, MD       [  START ON 12/12/2023] cholecalciferol (VITAMIN D3) 25 MCG (1000 UNIT) tablet 1,000 Units  1,000 Units Oral Daily Adhikari, Amrit, MD       heparin  ADULT infusion 100 units/mL (25000 units/250mL)  1,100 Units/hr Intravenous Continuous Mannie Pac T, DO 11 mL/hr at 12/11/23 1118 1,100 Units/hr at 12/11/23 1118   HYDROmorphone  (DILAUDID ) injection 0.5-1 mg  0.5-1 mg Intravenous Q2H PRN Jillian Buttery, MD       ondansetron  (ZOFRAN ) tablet 4 mg  4 mg Oral Q6H PRN Jillian Buttery, MD       Or   ondansetron  (ZOFRAN ) injection 4 mg  4 mg Intravenous Q6H PRN Adhikari, Amrit, MD       polyethylene glycol (MIRALAX / GLYCOLAX) packet 17 g  17 g Oral Daily Adhikari, Amrit, MD       potassium chloride  SA (KLOR-CON  M) CR tablet 40 mEq  40 mEq Oral Once Adhikari, Amrit, MD       senna-docusate (Senokot-S) tablet 1 tablet  1 tablet Oral BID Jillian Buttery, MD       tamoxifen  (NOLVADEX ) tablet 20 mg  20 mg Oral Daily Jillian Buttery, MD       Current Outpatient Medications  Medication  Sig Dispense Refill   acetaminophen  (TYLENOL ) 500 MG tablet Take 1,000 mg by mouth every 6 (six) hours as needed for moderate pain.     amLODipine  (NORVASC ) 10 MG tablet Take 1 tablet (10 mg total) by mouth every morning. 30 tablet 3   carvedilol  (COREG ) 3.125 MG tablet TAKE 1 TABLET BY MOUTH TWICE A DAY WITH A MEAL 180 tablet 1   cholecalciferol (VITAMIN D3) 25 MCG (1000 UNIT) tablet Take 1,000 Units by mouth daily.     dexamethasone  (DECADRON ) 4 MG tablet Take 2 tablets (8 mg) by mouth daily for 3 days starting the day after chemotherapy. Take with food. 30 tablet 1   KLOR-CON  M20 20 MEQ tablet TAKE 1 TABLET BY MOUTH EVERY DAY (Patient taking differently: Take 20 mEq by mouth 2 (two) times daily.) 90 tablet 1   lidocaine -prilocaine  (EMLA ) cream Apply 1 Application topically as needed. 30 g 0   MAGNESIUM PO Take 1 tablet by mouth every evening.     OLANZapine  (ZYPREXA ) 2.5 MG tablet TAKE 1 TABLET BY MOUTH AT BEDTIME. 90 tablet 1   ondansetron  (ZOFRAN ) 8 MG tablet Take 1 tablet (8 mg total) by mouth every 8 (eight) hours as needed for nausea or vomiting. Start on the third day after chemotherapy. 30 tablet 1   prochlorperazine  (COMPAZINE ) 10 MG tablet Take 1 tablet (10 mg total) by mouth every 6 (six) hours as needed for nausea or vomiting. 30 tablet 1   tamoxifen  (NOLVADEX ) 20 MG tablet Take 20 mg by mouth daily. (Patient not taking: Reported on 12/11/2023)     Facility-Administered Medications Ordered in Other Encounters  Medication Dose Route Frequency Provider Last Rate Last Admin   acetaminophen  (TYLENOL ) 325 MG tablet            diphenhydrAMINE  (BENADRYL ) 25 mg capsule             REVIEW OF SYSTEMS:   Respiratory: Denies cough, shortness of breath or difficulty breathing Cardiovascular: Denies palpitation, chest discomfort or chest pain Gastrointestinal: Reports some nausea, no vomiting, diarrhea, constipation Positive for epigastric to mid abdominal pain GU: Denies any difficulty with  urination, dysuria, frequency, hematuria Extremities: Denies any persistent swelling. All other systems were reviewed with the patient and are negative.  PHYSICAL EXAMINATION:  Vitals:  12/11/23 1300 12/11/23 1330  BP: 114/85 126/88  Pulse: 88 95  Resp: 18 18  Temp:    SpO2: 99% 100%   Filed Weights   12/11/23 1029  Weight: 154 lb (69.9 kg)    GENERAL:alert, no distress and comfortable SKIN: skin color normal.  EYES: normal, sclera clear NECK: supple. LUNGS: clear to auscultation and normal breathing effort.  No wheeze or rales HEART: regular rate & rhythm and no murmurs ABDOMEN: abdomen soft, non-tender and nondistended Musculoskeletal:  no lower extremity edema NEURO: alert   LABORATORY DATA:  I have reviewed the data as listed Lab Results  Component Value Date   WBC 10.7 (H) 12/11/2023   HGB 11.8 (L) 12/11/2023   HCT 34.8 (L) 12/11/2023   MCV 89.7 12/11/2023   PLT 166 12/11/2023   Recent Labs    11/10/23 0957 12/01/23 1341 12/11/23 1105  NA 143 142 138  K 3.7 3.7 3.2*  CL 110 108 107  CO2 29 27 22   GLUCOSE 90 89 120*  BUN 17 18 9   CREATININE 0.69 0.73 0.75  CALCIUM 9.1 9.2 8.7*  GFRNONAA >60 >60 >60  PROT 6.5 6.6 6.4*  ALBUMIN 4.0 4.1 3.2*  AST 14* 20 19  ALT 13 22 22   ALKPHOS 50 52 51  BILITOT 0.3 0.3 0.6  BILIDIR  --   --  <0.1  IBILI  --   --  NOT CALCULATED    RADIOGRAPHIC STUDIES: I have personally reviewed the radiological images as listed and agreed with the findings in the report. CT CHEST ABDOMEN PELVIS W CONTRAST Result Date: 12/11/2023 CLINICAL DATA:  Metastatic breast cancer restaging * Tracking Code: BO * EXAM: CT CHEST, ABDOMEN, AND PELVIS WITH CONTRAST TECHNIQUE: Multidetector CT imaging of the chest, abdomen and pelvis was performed following the standard protocol during bolus administration of intravenous contrast. RADIATION DOSE REDUCTION: This exam was performed according to the departmental dose-optimization program which  includes automated exposure control, adjustment of the mA and/or kV according to patient size and/or use of iterative reconstruction technique. CONTRAST:  OMNIPAQUE  IOHEXOL  300 MG/ML  SOLN COMPARISON:  09/22/2023 FINDINGS: CT CHEST FINDINGS Cardiovascular: Right chest port catheter. Normal heart size. No pericardial effusion. Mediastinum/Nodes: No enlarged mediastinal, hilar, or axillary lymph nodes. Small hiatal hernia. Thyroid  gland, trachea, and esophagus demonstrate no significant findings. Lungs/Pleura: Trace, residual loculated left pleural effusion, diminished in volume compared to prior examination. Diminished size of multiple left-sided pleural nodules, for example in the dependent left lower lobe measuring 0.7 cm, previously 1.4 x 0.8 cm (series 4, image 93) and in the medial inferior left lower lobe measuring 1.3 x 1.2 cm, previously 1.6 x 1.5 cm (series 4, image 118). Musculoskeletal: Status post bilateral mastectomy with implant reconstruction. No acute osseous findings. CT ABDOMEN PELVIS FINDINGS Hepatobiliary: No solid liver abnormality is seen. No gallstones, gallbladder wall thickening, or biliary dilatation. Pancreas: Unremarkable. No pancreatic ductal dilatation or surrounding inflammatory changes. Spleen: Normal in size without significant abnormality. Adrenals/Urinary Tract: Adrenal glands are unremarkable. Kidneys are normal, without renal calculi, solid lesion, or hydronephrosis. Bladder is unremarkable. Stomach/Bowel: Stomach is within normal limits. Appendix appears normal. No evidence of bowel wall thickening, distention, or inflammatory changes. Vascular/Lymphatic: New, extensive thrombosis of the superior mesenteric vein, main portal vein, and intrahepatic portal venous system (series 2, image 48, 53, 66). Mild, early variceal collateralization about the left upper quadrant. No enlarged abdominal or pelvic lymph nodes. Reproductive: Status post hysterectomy. Other: No abdominal wall  hernia or  abnormality. No ascites. Musculoskeletal: No acute osseous findings. Unchanged occasional sclerotic vertebral body metastases, notably of T9, T11, L2, and L3 (series 6, image 104). IMPRESSION: 1. New, extensive thrombosis of the superior mesenteric vein, main portal vein, and intrahepatic portal venous system. Mild, early variceal collateralization about the left upper quadrant. 2. Diminished size of multiple left-sided pleural nodules and trace, residual loculated left pleural effusion, consistent with treatment response of pleural metastatic disease. 3. Unchanged sclerotic vertebral body metastases. 4. No new evidence of metastatic disease in the chest, abdomen, or pelvis. 5. Status post bilateral mastectomy with implant reconstruction. Electronically Signed   By: Marolyn JONETTA Jaksch M.D.   On: 12/11/2023 07:04

## 2023-12-11 NOTE — Progress Notes (Signed)
 PHARMACY - ANTICOAGULATION CONSULT NOTE  Pharmacy Consult for IV heparin  Indication: DVT  Allergies  Allergen Reactions   Codeine Itching and Hives   Hydrocodone  Other (See Comments)   Latex Hives   Lisinopril  Cough   Tomato Itching    Reaction to canned tomatoes    Patient Measurements: Height: 5' 2.5 (158.8 cm) Weight: 69.9 kg (154 lb) IBW/kg (Calculated) : 51.25 HEPARIN  DW (KG): 65.8  Vital Signs: Temp: 99 F (37.2 C) (06/29 1534) Temp Source: Oral (06/29 1534) BP: 131/98 (06/29 1534) Pulse Rate: 93 (06/29 1534)  Labs: Recent Labs    12/11/23 1105 12/11/23 1654  HGB 11.8*  --   HCT 34.8*  --   PLT 166  --   HEPARINUNFRC  --  0.90*  CREATININE 0.75  --     Estimated Creatinine Clearance: 77.1 mL/min (by C-G formula based on SCr of 0.75 mg/dL).   Medical History: Past Medical History:  Diagnosis Date   Allergy    Breast cancer (HCC)    Breast cancer (HCC)    right   History of chemotherapy    doxetaxel/carboplatin /trastuzumab    History of migraine    last one about a week ago   Hx of radiation therapy 01/01/13- 02/15/13   r chest wall, r supraclav/axilla 5040 cGy/28 sessions, scar boost 1000 cGy/5 sessions   Hypertension    no meds,   urgent care on pomana   Migraine    Migraine     Medications:  Medications Prior to Admission  Medication Sig Dispense Refill Last Dose/Taking   acetaminophen  (TYLENOL ) 500 MG tablet Take 1,000 mg by mouth every 6 (six) hours as needed for moderate pain.   Unknown   amLODipine  (NORVASC ) 10 MG tablet Take 1 tablet (10 mg total) by mouth every morning. 30 tablet 3 12/11/2023   carvedilol  (COREG ) 3.125 MG tablet TAKE 1 TABLET BY MOUTH TWICE A DAY WITH A MEAL 180 tablet 1 12/11/2023   cholecalciferol (VITAMIN D3) 25 MCG (1000 UNIT) tablet Take 1,000 Units by mouth daily.   12/11/2023   dexamethasone  (DECADRON ) 4 MG tablet Take 2 tablets (8 mg) by mouth daily for 3 days starting the day after chemotherapy. Take with food. 30  tablet 1 Past Week   KLOR-CON  M20 20 MEQ tablet TAKE 1 TABLET BY MOUTH EVERY DAY (Patient taking differently: Take 20 mEq by mouth 2 (two) times daily.) 90 tablet 1 12/11/2023   lidocaine -prilocaine  (EMLA ) cream Apply 1 Application topically as needed. 30 g 0 Past Week   MAGNESIUM PO Take 1 tablet by mouth every evening.   Past Week   OLANZapine  (ZYPREXA ) 2.5 MG tablet TAKE 1 TABLET BY MOUTH AT BEDTIME. 90 tablet 1 12/10/2023   ondansetron  (ZOFRAN ) 8 MG tablet Take 1 tablet (8 mg total) by mouth every 8 (eight) hours as needed for nausea or vomiting. Start on the third day after chemotherapy. 30 tablet 1 Unknown   prochlorperazine  (COMPAZINE ) 10 MG tablet Take 1 tablet (10 mg total) by mouth every 6 (six) hours as needed for nausea or vomiting. 30 tablet 1 Unknown   tamoxifen  (NOLVADEX ) 20 MG tablet Take 20 mg by mouth daily. 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 Pt not on blood thinners PTA. Pt PMH  includes metastastic breast cancer.   Today, 12/11/23 Hgb 11.8, plt 166  Scr 0.75 mg/dl, CrCl 77 ml/min  First heparin  level = 0.9 -   a little above goal 6 hours after 5000 unit bolus and drip at 1100 units/hr - could have some bolus effect No bleeding reported  Goal of Therapy:  Heparin  level 0.3-0.7 units/ml Monitor platelets by anticoagulation protocol: Yes   Plan:  Continue heparin  1100 units/hr and check heparin  level in 6 hours Daily CBC and HL Monitor for signs and symptoms of bleeding  Rosaline IVAR Edison, Pharm.D Use secure chat for questions 12/11/2023 6:04 PM

## 2023-12-11 NOTE — ED Provider Notes (Signed)
 [place] [facility] AT [facility] Provider Note  CSN: [id number] Arrival date & time: 12/11/23 1026  Chief Complaint(s) Abnormal Labs  HPI Sherry Barry is a 53 y.o. 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  Patient with history of breast cancer, currently receiving chemotherapy.   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 Authorizing Provider  acetaminophen  (TYLENOL ) 500 MG tablet Take 1,000 mg by mouth every 6 (six) hours as needed for moderate pain.   Yes [provider]  amLODipine  (NORVASC ) 10 MG tablet Take 1 tablet (10 mg total) by mouth every morning. 10/29/21  Yes Causey, Morna Pickle, NP   carvedilol  (COREG ) 3.125 MG tablet TAKE 1 TABLET BY MOUTH TWICE A DAY WITH A MEAL 10/05/23  Yes Iruku, Praveena, MD  cholecalciferol (VITAMIN D3) 25 MCG (1000 UNIT) tablet Take 1,000 Units by mouth daily.   Yes [provider]  dexamethasone  (DECADRON ) 4 MG tablet Take 2 tablets (8 mg) by mouth daily for 3 days starting the day after chemotherapy. Take with food. 07/18/23  Yes Iruku, Praveena, MD  KLOR-CON  M20 20 MEQ tablet TAKE 1 TABLET BY MOUTH EVERY DAY Patient taking differently: Take 20 mEq by mouth 2 (two) times daily. 04/25/23  Yes Iruku, Praveena, MD  lidocaine -prilocaine  (EMLA ) cream Apply 1 Application topically as needed. 10/22/22  Yes Iruku, Praveena, MD  MAGNESIUM PO Take 1 tablet by mouth every evening.   Yes [provider]  OLANZapine  (ZYPREXA ) 2.5 MG tablet TAKE 1 TABLET BY MOUTH AT BEDTIME. 10/03/23  Yes Iruku, Praveena, MD  ondansetron  (ZOFRAN ) 8 MG tablet Take 1 tablet (8 mg total) by mouth every 8 (eight) hours as needed for nausea or vomiting. Start on the third day after chemotherapy. 07/18/23  Yes Iruku, Praveena, MD  prochlorperazine  (COMPAZINE ) 10 MG tablet Take 1 tablet (10 mg total) by mouth every 6 (six) hours as needed for nausea or vomiting. 07/18/23  Yes Iruku, Praveena, MD  tamoxifen  (NOLVADEX ) 20 MG tablet Take 20 mg by mouth daily. Patient not taking: Reported on 12/11/2023 10/11/23   [provider]  Past Surgical History Past Surgical History:  Procedure Laterality Date   ABDOMINAL HYSTERECTOMY     no salpingo-oophorectomy 2009   BREAST RECONSTRUCTION WITH PLACEMENT OF TISSUE EXPANDER AND FLEX HD (ACELLULAR HYDRATED DERMIS) Left 09/24/2013   IR IMAGING GUIDED PORT INSERTION  10/07/2021   IR THORACENTESIS ASP PLEURAL SPACE W/IMG GUIDE  08/11/2021   IR THORACENTESIS ASP PLEURAL SPACE W/IMG GUIDE  10/07/2021    LATISSIMUS FLAP TO BREAST Right 09/24/2013   Procedure: RIGHT LATISSMUS MYOCUTAEIOUS MUSCLE FLAP AND PLACEMENT OF TISSUE LESLEIGH;  Surgeon: Estefana Reichert, DO;  Location: [facility];  Service: Plastics;  Laterality: Right;   LIPOSUCTION WITH LIPOFILLING Bilateral 12/12/2013   Procedure: LIPOSUCTION WITH LIPOFILLING;  Surgeon: Estefana Reichert, DO;  Location: [facility];  Service: Plastics;  Laterality: Bilateral;   PORT-A-CATH REMOVAL Left 12/12/2013   Procedure: REMOVAL PORT-A-CATH;  Surgeon: Estefana Reichert, DO;  Location: [facility];  Service: Plastics;  Laterality: Left;   PORTACATH PLACEMENT  07/14/2012   Procedure: INSERTION PORT-A-CATH;  Surgeon: Debby LABOR. Cornett, MD;  Location: [facility];  Service: General;  Laterality: Left;   RECONSTRUCTION BREAST W/ LATISSIMUS DORSI FLAP Right 09/24/2013   & tissue expander placement   REMOVAL OF BILATERAL TISSUE EXPANDERS WITH PLACEMENT OF BILATERAL BREAST IMPLANTS Bilateral 12/12/2013   Procedure: REMOVAL OF BILATERAL TISSUE EXPANDERS WITH PLACEMENT OF BILATERAL BREAST IMPLANTS/BILATERAL CAPSULECTOMIES WITH  LIPOFILLING FAT GRAFTING;  Surgeon: Estefana Reichert, DO;  Location: [facility];  Service: Plastics;  Laterality: Bilateral;   SIMPLE MASTECTOMY WITH AXILLARY SENTINEL NODE BIOPSY  07/14/2012   Procedure: SIMPLE MASTECTOMY WITH AXILLARY SENTINEL NODE BIOPSY;  Surgeon: Debby LABOR. Cornett, MD;  Location: [facility];  Service: General;  Laterality: Right;  Bilateral simple mastectomy with port and right sebtibel lymph node mapping   SIMPLE MASTECTOMY WITH AXILLARY SENTINEL NODE BIOPSY  07/14/2012   Procedure: SIMPLE MASTECTOMY;  Surgeon: Debby LABOR. Cornett, MD;  Location: [facility];  Service: General;  Laterality: Left;   TISSUE EXPANDER PLACEMENT Left 09/24/2013   Procedure: PLACEMENT OF TISSUE EXPANDER AND FLEX HD TO LEFT BREAST;  Surgeon: Estefana Reichert, DO;  Location: [facility];  Service: Plastics;  Laterality: Left;   Family History Family  History  Problem Relation Age of Onset   Hypertension Mother    Alcohol abuse Mother    Heart disease Maternal Grandmother    Stroke Maternal Grandfather    Colon cancer Maternal Aunt 55       alive at 43   Brain cancer Maternal Uncle 33       and lymphoma in early 44s; deceased   Brain cancer Maternal Uncle 4       deceased   Pancreatic cancer Maternal Uncle 45       alive at 36   Breast cancer Cousin 37       mat 1st cousin once removed through mat GF ; deceased   Breast cancer Maternal Aunt        great aunt through mat GF; dx at ? age    Social History Social History   Tobacco Use   Smoking status: Never   Smokeless tobacco: Never  Substance Use Topics   Alcohol use: Yes    Comment: occasional   Drug use: No   Allergies Codeine, Hydrocodone , Latex, Lisinopril , and Tomato  Review of Systems Review of Systems  Physical Exam Vital Signs  I have reviewed the triage vital signs BP 115/85   Pulse 93   Temp 99  F (37.2 C) (Oral)   Resp 18   Ht 5' 2.5 (1.588 m)   Wt 69.9 kg   SpO2 99%   BMI 27.72 kg/m   Physical Exam Vitals and nursing note reviewed.  Constitutional:      Appearance: Normal appearance. She is not toxic-appearing.  Abdominal:     General: Abdomen is flat. There is no distension.     Palpations: Abdomen is soft.     Tenderness: There is no abdominal tenderness.   Neurological:     General: No focal deficit present.     Mental Status: She is alert.     ED Results and Treatments Labs (all labs ordered are listed, but only abnormal results are displayed) Labs Reviewed  BASIC METABOLIC PANEL WITH GFR - Abnormal; Notable for the following components:      Result Value   Potassium 3.2 (*)    Glucose, Bld 120 (*)    Calcium 8.7 (*)    All other components within normal limits  CBC WITH DIFFERENTIAL/PLATELET - Abnormal; Notable for the following components:   WBC 10.7 (*)    Hemoglobin 11.8 (*)    HCT 34.8 (*)    Neutro Abs 8.1 (*)     Abs Immature Granulocytes 0.10 (*)    All other components within normal limits  HEPARIN  LEVEL (UNFRACTIONATED)  HEPATIC FUNCTION PANEL                                                                                                                          Radiology No results found.  Pertinent labs & imaging results that were available during my care of the patient were reviewed by me and considered in my medical decision making (see MDM for details).  Medications Ordered in ED Medications  heparin  ADULT infusion 100 units/mL (25000 units/250mL) (1,100 Units/hr Intravenous New Bag/Given 12/11/23 1118)  heparin  bolus via infusion 5,000 Units (5,000 Units Intravenous Bolus from Bag 12/11/23 1118)                                                                                                                                     Procedures Procedures  (including critical care time)  Medical Decision Making / ED Course   This patient presents to the ED for concern of abdominal pain and portal thrombus, this involves an extensive number of treatment options, and is a complaint  that carries with it a high risk of complications and morbidity.  The differential diagnosis includes intra-abdominal thrombus.  MDM: 53 year old female, treated for breast cancer.  Outpatient imaging showing extensive thrombus in the superior mesenteric artery, hepatic veins.  Started on heparin .  Reached out to the on-call oncologist Dr. Tina who agreed with heparin , will have patient's primary oncologist follow-up.  Will admit patient to hospitalist.   Additional history obtained: -Additional history obtained from  -External records from outside source obtained and reviewed including: Chart review including previous notes, labs, imaging, consultation notes   Lab Tests: -I ordered, reviewed, and interpreted labs.   The pertinent results include:   Labs Reviewed  BASIC METABOLIC PANEL WITH GFR - Abnormal;  Notable for the following components:      Result Value   Potassium 3.2 (*)    Glucose, Bld 120 (*)    Calcium 8.7 (*)    All other components within normal limits  CBC WITH DIFFERENTIAL/PLATELET - Abnormal; Notable for the following components:   WBC 10.7 (*)    Hemoglobin 11.8 (*)    HCT 34.8 (*)    Neutro Abs 8.1 (*)    Abs Immature Granulocytes 0.10 (*)    All other components within normal limits  HEPARIN  LEVEL (UNFRACTIONATED)  HEPATIC FUNCTION PANEL      Medicines ordered and prescription drug management: Meds ordered this encounter  Medications   heparin  bolus via infusion 5,000 Units   heparin  ADULT infusion 100 units/mL (25000 units/250mL)    -I have reviewed the patients home medicines and have made adjustments as needed   Cardiac Monitoring: The patient was maintained on a cardiac monitor.  I personally viewed and interpreted the cardiac monitored which showed an underlying rhythm of: Normal sinus rhythm  Social Determinants of Health:  Factors impacting patients care include: Multiple medical comorbidities including breast cancer   Reevaluation: After the interventions noted above, I reevaluated the patient and found that they have :improved  Co morbidities that complicate the patient evaluation  Past Medical History:  Diagnosis Date   Allergy    Breast cancer (HCC)    Breast cancer (HCC)    right   History of chemotherapy    doxetaxel/carboplatin /trastuzumab    History of migraine    last one about a week ago   Hx of radiation therapy 01/01/13- 02/15/13   r chest wall, r supraclav/axilla 5040 cGy/28 sessions, scar boost 1000 cGy/5 sessions   Hypertension    no meds,   urgent care on pomana   Migraine    Migraine         Final Clinical Impression(s) / ED Diagnoses Final diagnoses:  Portal vein thrombosis     @PCDICTATION @    Mannie Pac T, DO 12/11/23 1327

## 2023-12-11 NOTE — Telephone Encounter (Signed)
 Called from triage, report stat CT finding.  Reviewed CT reported of extensive new thrombosis.  Vascular/Lymphatic: New, extensive thrombosis of the superior mesenteric vein, main portal vein, and intrahepatic portal venous system (series 2, image 48, 53, 66). Mild, early variceal collateralization about the left upper quadrant.  Called and spoke to patient. Report abdominal for a week. Recommend come to ED immediately for evaluation and start IV anticoagulation. She understands and will proceed.

## 2023-12-11 NOTE — H&P (Signed)
 History and Physical    Sherry Barry FMW:[id number] DOB: 1971/05/29 DOA: 12/11/2023  PCP: [name], MD   Patient coming from: Home    Chief Complaint: Outpatient CT scan showing mesenteric, portal vein thrombosis  HPI: Sherry Barry is a 53 y.o. female with medical history significant of stage IV triple positive breast cancer, hypertension who presented to the emergency department as per recommendation from her oncologist. 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  She was prompted to the emergency room for the initiation of anticoagulation therapy. . She had presented to oncology on 12/01/2023 for regular follow-up and was complaining of some fatigue and nausea.  On  presentation to the ED, she was hemodynamically stable, overall comfortable.  Blood pressure was stable.  Lab work showed potassium of 3.2, WBC count of 10.7. Started on heparin  drip in the emergency department.  Oncology consulted. Patient seen and examined at the bedside in the emergency department.  During my evaluation, she was not in any kind of distress.  Calm and comfortable.  Denied any fever, chills, chest pain, shortness of breath, cough, abdominal pain, nausea, vomiting, hematochezia or melena.  Has slight abdomen fullness due to lack of proper bowel movement since last few days.  She lives with her family can ambulate without any problem.   Review of Systems: As per HPI otherwise 10 point review of systems negative.    Past Medical History:  Diagnosis Date   Allergy    Breast cancer (HCC)    Breast cancer (HCC)    right   History of chemotherapy    doxetaxel/carboplatin /trastuzumab    History of migraine    last one about a week ago   Hx of radiation therapy 01/01/13- 02/15/13   r chest wall, r supraclav/axilla 5040 cGy/28 sessions,  scar boost 1000 cGy/5 sessions   Hypertension    no meds,   urgent care on pomana   Migraine    Migraine     Past Surgical History:  Procedure Laterality Date   ABDOMINAL HYSTERECTOMY     no salpingo-oophorectomy 2009   BREAST RECONSTRUCTION WITH PLACEMENT OF TISSUE EXPANDER AND FLEX HD (ACELLULAR HYDRATED DERMIS) Left 09/24/2013   IR IMAGING GUIDED PORT INSERTION  10/07/2021   IR THORACENTESIS ASP PLEURAL SPACE W/IMG GUIDE  08/11/2021   IR THORACENTESIS ASP PLEURAL SPACE W/IMG GUIDE  10/07/2021   LATISSIMUS FLAP TO BREAST Right 09/24/2013   Procedure: RIGHT LATISSMUS MYOCUTAEIOUS MUSCLE FLAP AND PLACEMENT OF TISSUE LESLEIGH;  Surgeon: Estefana Reichert, DO;  Location: [facility];  Service: Plastics;  Laterality: Right;   LIPOSUCTION WITH LIPOFILLING Bilateral 12/12/2013   Procedure: LIPOSUCTION WITH LIPOFILLING;  Surgeon: Estefana Reichert, DO;  Location: [facility];  Service: Plastics;  Laterality: Bilateral;   PORT-A-CATH REMOVAL Left 12/12/2013   Procedure: REMOVAL PORT-A-CATH;  Surgeon: Estefana Reichert, DO;  Location: [facility];  Service: Plastics;  Laterality: Left;   PORTACATH PLACEMENT  07/14/2012   Procedure: INSERTION PORT-A-CATH;  Surgeon: Debby LABOR. Cornett, MD;  Location: [facility];  Service: General;  Laterality: Left;   RECONSTRUCTION BREAST W/ LATISSIMUS DORSI FLAP Right 09/24/2013   & tissue expander placement   REMOVAL OF BILATERAL TISSUE EXPANDERS WITH PLACEMENT OF BILATERAL BREAST IMPLANTS Bilateral 12/12/2013   Procedure: REMOVAL OF BILATERAL TISSUE EXPANDERS WITH PLACEMENT OF BILATERAL BREAST IMPLANTS/BILATERAL CAPSULECTOMIES WITH  LIPOFILLING FAT GRAFTING;  Surgeon:  [name], DO;  Location:  SURGERY CENTER;  Service: Plastics;  Laterality: Bilateral;   SIMPLE MASTECTOMY WITH AXILLARY SENTINEL NODE BIOPSY  07/14/2012   Procedure: SIMPLE MASTECTOMY WITH AXILLARY SENTINEL NODE BIOPSY;  Surgeon: Debby LABOR. Cornett, MD;  [facility];  Service: General;   Laterality: Right;  Bilateral simple mastectomy with port and right sebtibel lymph node mapping   SIMPLE MASTECTOMY WITH AXILLARY SENTINEL NODE BIOPSY  07/14/2012   Procedure: SIMPLE MASTECTOMY;  Surgeon: Debby LABOR. Cornett, MD;  Location: [facility];  Service: General;  Laterality: Left;   TISSUE EXPANDER PLACEMENT Left 09/24/2013   Procedure: PLACEMENT OF TISSUE EXPANDER AND FLEX HD TO LEFT BREAST;  Surgeon: Estefana Reichert, DO;  [facility]: [facility];  Service: Plastics;  Laterality: Left;     reports that she has never smoked. She has never used smokeless tobacco. She reports current alcohol use. She reports that she does not use drugs.  Allergies  Allergen Reactions   Codeine Itching and Hives   Hydrocodone  Other (See Comments)   Latex Hives   Lisinopril  Cough   Tomato Itching    Reaction to canned tomatoes    Family History  Problem Relation Age of Onset   Hypertension Mother    Alcohol abuse Mother    Heart disease Maternal Grandmother    Stroke Maternal Grandfather    Colon cancer Maternal Aunt 55       alive at 6   Brain cancer Maternal Uncle 68       and lymphoma in early 36s; deceased   Brain cancer Maternal Uncle 62       deceased   Pancreatic cancer Maternal Uncle 45       alive at 62   Breast cancer Cousin 24       mat 1st cousin once removed through mat GF ; deceased   Breast cancer Maternal Aunt        great aunt through mat GF; dx at ? age     Prior to Admission medications   Medication Sig Start Date End Date Taking? Authorizing Provider  acetaminophen  (TYLENOL ) 500 MG tablet Take 1,000 mg by mouth every 6 (six) hours as needed for moderate pain.   Yes [provider]  amLODipine  (NORVASC ) 10 MG tablet Take 1 tablet (10 mg total) by mouth every morning. 10/29/21  Yes Causey, Morna Pickle, NP  carvedilol  (COREG ) 3.125 MG tablet TAKE 1 TABLET BY MOUTH TWICE A DAY WITH A MEAL 10/05/23  Yes Iruku, Praveena, MD  cholecalciferol (VITAMIN D3) 25 MCG (1000 UNIT)  tablet Take 1,000 Units by mouth daily.   Yes [provider]  dexamethasone  (DECADRON ) 4 MG tablet Take 2 tablets (8 mg) by mouth daily for 3 days starting the day after chemotherapy. Take with food. 07/18/23  Yes Iruku, Praveena, MD  KLOR-CON  M20 20 MEQ tablet TAKE 1 TABLET BY MOUTH EVERY DAY Patient taking differently: Take 20 mEq by mouth 2 (two) times daily. 04/25/23  Yes Iruku, Praveena, MD  lidocaine -prilocaine  (EMLA ) cream Apply 1 Application topically as needed. 10/22/22  Yes Iruku, Praveena, MD  MAGNESIUM PO Take 1 tablet by mouth every evening.   Yes [provider]  OLANZapine  (ZYPREXA ) 2.5 MG tablet TAKE 1 TABLET BY MOUTH AT BEDTIME. 10/03/23  Yes Iruku, Praveena, MD  ondansetron  (ZOFRAN ) 8 MG tablet Take 1 tablet (8 mg total) by mouth every 8 (eight) hours as needed for nausea or vomiting. Start on the third day after chemotherapy. 07/18/23  Yes Iruku, Praveena, MD  prochlorperazine  (COMPAZINE ) 10 MG tablet Take 1 tablet (10 mg total) by mouth every 6 (six) hours as needed for nausea or vomiting. 07/18/23  Yes Iruku, Praveena, MD  tamoxifen  (NOLVADEX ) 20 MG tablet Take 20 mg by mouth daily. Patient not taking: Reported on 12/11/2023 10/11/23   [provider]    Physical Exam: Vitals:   12/11/23 1200 12/11/23 1230 12/11/23 1300 12/11/23 1330  BP: 115/88 116/84 114/85 126/88  Pulse: 86 88 88 95  Resp: 20 19 18 18   Temp:      TempSrc:      SpO2: 100% 100% 99% 100%  Weight:      Height:        Constitutional: NAD, calm, comfortable, pleasant lady Vitals:   12/11/23 1200 12/11/23 1230 12/11/23 1300 12/11/23 1330  BP: 115/88 116/84 114/85 126/88  Pulse: 86 88 88 95  Resp: 20 19 18 18   Temp:      TempSrc:      SpO2: 100% 100% 99% 100%  Weight:      Height:       Eyes: PERRL, lids and conjunctivae normal ENMT: Mucous membranes are moist.  Neck: normal, supple, no masses, no thyromegaly Respiratory: clear to auscultation bilaterally, no wheezing, no  crackles. Normal respiratory effort. No accessory muscle use.  Cardiovascular: Regular rate and rhythm, no murmurs / rubs / gallops. No extremity edema.  Chemo-Port on the right chest Abdomen: no tenderness, no masses palpated. No hepatosplenomegaly. Bowel sounds positive.  Mild abdominal distention.  Mild tenderness in the epigastric region Musculoskeletal: no clubbing / cyanosis. No joint deformity upper and lower extremities.  Skin: no rashes, lesions, ulcers. No induration Neurologic: CN 2-12 grossly intact.  Strength 5/5 in all 4.  Psychiatric: Normal judgment and insight. Alert and oriented x 3. Normal mood.   Foley Catheter:None  Labs on Admission: I have personally reviewed following labs and imaging studies  CBC: Recent Labs  Lab 12/11/23 1105  WBC 10.7*  NEUTROABS 8.1*  HGB 11.8*  HCT 34.8*  MCV 89.7  PLT 166   Basic Metabolic Panel: Recent Labs  Lab 12/11/23 1105  NA 138  K 3.2*  CL 107  CO2 22  GLUCOSE 120*  BUN 9  CREATININE 0.75  CALCIUM 8.7*   GFR: Estimated Creatinine Clearance: 77.1 mL/min (by C-G formula based on SCr of 0.75 mg/dL). Liver Function Tests: Recent Labs  Lab 12/11/23 1105  AST 19  ALT 22  ALKPHOS 51  BILITOT 0.6  PROT 6.4*  ALBUMIN 3.2*   No results for input(s): LIPASE, AMYLASE in the last 168 hours. No results for input(s): AMMONIA in the last 168 hours. Coagulation Profile: No results for input(s): INR, PROTIME in the last 168 hours. Cardiac Enzymes: No results for input(s): CKTOTAL, CKMB, CKMBINDEX, TROPONINI in the last 168 hours. BNP (last 3 results) No results for input(s): PROBNP in the last 8760 hours. HbA1C: No results for input(s): HGBA1C in the last 72 hours. CBG: No results for input(s): GLUCAP in the last 168 hours. Lipid Profile: No results for input(s): CHOL, HDL, LDLCALC, TRIG, CHOLHDL, LDLDIRECT in the last 72 hours. Thyroid  Function Tests: No results for input(s):  TSH, T4TOTAL, FREET4, T3FREE, THYROIDAB in the last 72 hours. Anemia Panel: No results for input(s): VITAMINB12, FOLATE, FERRITIN, TIBC, IRON, RETICCTPCT in the last 72 hours. Urine analysis:    Component Value Date/Time   COLORURINE STRAW (A) 10/17/2021 2142   APPEARANCEUR CLEAR 10/17/2021 2142   LABSPEC  1.003 (L) 10/17/2021 2142   PHURINE 7.0 10/17/2021 2142   GLUCOSEU NEGATIVE 10/17/2021 2142   HGBUR SMALL (A) 10/17/2021 2142   BILIRUBINUR NEGATIVE 10/17/2021 2142   KETONESUR NEGATIVE 10/17/2021 2142   PROTEINUR NEGATIVE 10/17/2021 2142   NITRITE NEGATIVE 10/17/2021 2142   LEUKOCYTESUR TRACE (A) 10/17/2021 2142    Radiological Exams on Admission: No results found.   Assessment/Plan Principal Problem:   Mesenteric vein thrombosis (HCC) Active Problems:   Essential hypertension   Malignant neoplasm of upper-inner quadrant of right breast in female, estrogen receptor positive (HCC)   Mesenteric vein thrombosis/portal vein thrombosis: New problem.  CT abdomen/pelvis showed  extensive thrombosis of the superior mesenteric vein, main portal vein, and intrahepatic portal venous system. Oncology consulted and following.  Plan is to start on heparin  drip.  Most likely she will be transitioned to Eliquis in the next 24  hours.  After discussion with oncology, there is no emergent need to consult vascular surgery.  Metastatic breast cancer: Stage IV breast cancer.  Previously  on tamoxifen .  Follows with oncology.  CT chest/abdomen/pelvis did not show any new evidence of metastatic disease in the chest, abdomen, or pelvis.She is status post bilateral mastectomy with implant reconstruction.  Hypokalemia: Supplemented with potassium  History of hypertension: Takes amlodipine  and Coreg  at home.  Currently blood pressure stable.  This medications are on hold  Constipation: Continue bowel regimen        Severity of Illness: The appropriate patient status for  this patient is INPATIENT.   DVT prophylaxis: iv heparin  Code Status: Full Family Communication: None at bedside Consults called: Oncology     Ivonne Mustache MD [facility]  12/11/2023, 2:08 PM

## 2023-12-11 NOTE — ED Triage Notes (Signed)
 Patient to ED by POV for abnormal labs. States she received a call from the Cancer center telling her she need to go to ED for possible ABD clot.

## 2023-12-11 NOTE — Progress Notes (Addendum)
 PHARMACY - ANTICOAGULATION CONSULT NOTE  Pharmacy Consult for IV heparin  Indication: DVT  Allergies  Allergen Reactions   Codeine Itching and Hives   Hydrocodone  Other (See Comments)   Latex Hives   Lisinopril  Cough   Tomato Itching    Reaction to canned tomatoes    Patient Measurements: Height: 5' 2.5 (158.8 cm) Weight: 69.9 kg (154 lb) IBW/kg (Calculated) : 51.25 HEPARIN  DW (KG): 65.8  Vital Signs: Temp: 99 F (37.2 C) (06/29 1030) Temp Source: Oral (06/29 1030) BP: 116/85 (06/29 1030) Pulse Rate: 111 (06/29 1030)  Labs: No results for input(s): HGB, HCT, PLT, APTT, LABPROT, INR, HEPARINUNFRC, HEPRLOWMOCWT, CREATININE, CKTOTAL, CKMB, TROPONINIHS in the last 72 hours.  Estimated Creatinine Clearance: 77.1 mL/min (by C-G formula based on SCr of 0.73 mg/dL).   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 Pt not on blood thinners PTA. Pt PMH includes metastastic breast cancer.   Today, 12/11/23 Hgb 11.8, plt 166  Scr 0.75 mg/dl, CrCl 77 ml/min   Goal of Therapy:  Heparin  level 0.3-0.7 units/ml Monitor platelets by anticoagulation protocol: Yes   Plan:  Heparin  5000 unit IV bolus followed by heparin  1100 units/hr  Obtain HL 6 hours after start of  infusion Daily CBC and HL Monitor for signs and symptoms of bleeding     Dolphus Roller, PharmD, BCPS 12/11/2023 11:04 AM

## 2023-12-11 NOTE — Assessment & Plan Note (Signed)
 Interval response on CT scan from [facility] . This can be follow-up long-term as outpatient.

## 2023-12-11 NOTE — Plan of Care (Signed)

## 2023-12-11 NOTE — ED Notes (Signed)
 ED TO INPATIENT HANDOFF REPORT  Name/Age/Gender Sherry Barry 53 y.o. 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]  Level of Care/Admitting Diagnosis ED Disposition     ED Disposition  Admit   Condition  --   Comment  Hospital Area: [facility] [[id number]]  Level of Care: Telemetry [5]  Admit to tele based on following criteria: Complex arrhythmia (Bradycardia/Tachycardia)  May admit patient to [facility] or [facility] if equivalent level of care is available:: No  Covid Evaluation: Confirmed COVID Negative  Diagnosis: Mesenteric vein thrombosis [facility]) [[id number]]  Admitting Physician: JILLIAN BUTTERY [[id number]]  Attending Physician: ADHIKARI, AMRIT [[id number]]  Certification:: I certify this patient will need inpatient services for at least 2 midnights  Expected Medical Readiness: 12/13/2023          Medical History Past Medical History:  Diagnosis Date   Allergy    Breast cancer (HCC)    Breast cancer (HCC)    right   History of chemotherapy    doxetaxel/carboplatin /trastuzumab    History of migraine    last one about a week ago   Hx of radiation therapy 01/01/13- 02/15/13   r chest wall, r supraclav/axilla 5040 cGy/28 sessions, scar boost 1000 cGy/5 sessions   Hypertension    no meds,   urgent care on pomana   Migraine    Migraine     Allergies Allergies  Allergen Reactions   Codeine Itching and  Hives   Hydrocodone  Other (See Comments)   Latex Hives   Lisinopril  Cough   Tomato Itching    Reaction to canned tomatoes    IV Location/Drains/Wounds Patient Lines/Drains/Airways Status     Active Line/Drains/Airways     Name Placement date Placement time Site Days   Implanted Port 10/07/21 Right Chest 10/07/21  1401  Chest  795   Closed System Drain 1 Right;Lateral Chest Bulb (JP) 19 Fr. 07/14/12  1616  Chest  4167   Closed System Drain 2 Right;Medial Chest Bulb (JP) 19 Fr. 07/14/12  1617  Chest  4167   Closed System Drain 3 Left Chest Bulb (JP) 19 Fr. 07/14/12  1618  Chest  4167   Closed System Drain 1 Right;Lateral Back Bulb (JP) 19 Fr. 09/24/13  1425  Back  3730   Closed System Drain 2 Right;Lateral Back Bulb (JP) 19 Fr. 09/24/13  1426  Back  3730   Closed System Drain 3 Right Breast Bulb (JP) 19 Fr. 09/24/13  1545  Breast  3730   Closed System Drain 4 Left Breast Bulb (JP) 19 Fr. 09/24/13  1617  Breast  3730   Wound / Incision (Open or Dehisced) 09/28/21 Puncture Chest Left;Lateral 09/28/21  1058  Chest  804  Labs/Imaging Results for orders placed or performed during the hospital encounter of 12/11/23 (from the past 48 hours)  Basic metabolic panel     Status: Abnormal   Collection Time: 12/11/23 11:05 AM  Result Value Ref Range   Sodium 138 135 - 145 mmol/L   Potassium 3.2 (L) 3.5 - 5.1 mmol/L   Chloride 107 98 - 111 mmol/L   CO2 22 22 - 32 mmol/L   Glucose, Bld 120 (H) 70 - 99 mg/dL    Comment: Glucose reference range applies only to samples taken after fasting for at least 8 hours.   BUN 9 6 - 20 mg/dL   Creatinine, Ser 9.24 0.44 - 1.00 mg/dL   Calcium 8.7 (L) 8.9 - 10.3 mg/dL   GFR, Estimated >39 >39 mL/min    Comment: (NOTE) Calculated using the CKD-EPI Creatinine Equation (2021)    Anion gap 9 5 - 15    Comment: Performed at [facility], [address] [place], [place] [address]  CBC with Differential     Status: Abnormal    Collection Time: 12/11/23 11:05 AM  Result Value Ref Range   WBC 10.7 (H) 4.0 - 10.5 K/uL   RBC 3.88 3.87 - 5.11 MIL/uL   Hemoglobin 11.8 (L) 12.0 - 15.0 g/dL   HCT 65.1 (L) 63.9 - 53.9 %   MCV 89.7 80.0 - 100.0 fL   MCH 30.4 26.0 - 34.0 pg   MCHC 33.9 30.0 - 36.0 g/dL   RDW 86.9 88.4 - 84.4 %   Platelets 166 150 - 400 K/uL   nRBC 0.0 0.0 - 0.2 %   Neutrophils Relative % 76 %   Neutro Abs 8.1 (H) 1.7 - 7.7 K/uL   Lymphocytes Relative 14 %   Lymphs Abs 1.5 0.7 - 4.0 K/uL   Monocytes Relative 8 %   Monocytes Absolute 0.8 0.1 - 1.0 K/uL   Eosinophils Relative 1 %   Eosinophils Absolute 0.1 0.0 - 0.5 K/uL   Basophils Relative 0 %   Basophils Absolute 0.0 0.0 - 0.1 K/uL   Immature Granulocytes 1 %   Abs Immature Granulocytes 0.10 (H) 0.00 - 0.07 K/uL    Comment: Performed at [facility], [address], [place], [place] [address]  Hepatic function panel     Status: Abnormal   Collection Time: 12/11/23 11:05 AM  Result Value Ref Range   Total Protein 6.4 (L) 6.5 - 8.1 g/dL   Albumin 3.2 (L) 3.5 - 5.0 g/dL   AST 19 15 - 41 U/L   ALT 22 0 - 44 U/L   Alkaline Phosphatase 51 38 - 126 U/L   Total Bilirubin 0.6 0.0 - 1.2 mg/dL   Bilirubin, Direct <9.8 0.0 - 0.2 mg/dL   Indirect Bilirubin NOT CALCULATED 0.3 - 0.9 mg/dL    Comment: Performed at [facility], [address], [place], [place] [address]   No results found.  Pending Labs Unresulted Labs (From admission, onward)     Start     Ordered   12/12/23 0500  Protime-INR  Daily,   R      12/11/23 1043   12/12/23 0500  Basic metabolic panel  Tomorrow morning,   R        12/11/23 1402   12/12/23 0500  CBC  Tomorrow morning,   R        12/11/23 1402   12/11/23 1730  Heparin  level (unfractionated)  Once-Timed,   URGENT  12/11/23 1144   12/11/23 1359  HIV Antibody (routine testing w rflx)  (HIV Antibody (Routine testing w reflex) panel)  Once,   R        12/11/23 1402             Vitals/Pain Today's Vitals   12/11/23 1200 12/11/23 1230 12/11/23 1300 12/11/23 1330  BP: 115/88 116/84 114/85 126/88  Pulse: 86 88 88 95  Resp: 20 19 18 18   Temp:      TempSrc:      SpO2: 100% 100% 99% 100%  Weight:      Height:      PainSc:        Isolation Precautions No active isolations  Medications Medications  heparin  ADULT infusion 100 units/mL (25000 units/250mL) (1,100 Units/hr Intravenous New Bag/Given 12/11/23 1118)  acetaminophen  (TYLENOL ) tablet 1,000 mg (has no administration in time range)  cholecalciferol (VITAMIN D3) 25 MCG (1000 UNIT) tablet 1,000 Units (has no administration in time range)  HYDROmorphone  (DILAUDID ) injection 0.5-1 mg (has no administration in time range)  ondansetron  (ZOFRAN ) tablet 4 mg (has no administration in time range)    Or  ondansetron  (ZOFRAN ) injection 4 mg (has no administration in time range)  potassium chloride  SA (KLOR-CON  M) CR tablet 40 mEq (has no administration in time range)  polyethylene glycol (MIRALAX / GLYCOLAX) packet 17 g (has no administration in time range)  senna-docusate (Senokot-S) tablet 1 tablet (has no administration in time range)  bisacodyl  (DULCOLAX) suppository 10 mg (has no administration in time range)  heparin  bolus via infusion 5,000 Units (5,000 Units Intravenous Bolus from Bag 12/11/23 1118)    Mobility walks

## 2023-12-11 NOTE — ED Notes (Signed)
 Attempted to call report

## 2023-12-12 ENCOUNTER — Other Ambulatory Visit (HOSPITAL_COMMUNITY): Payer: Self-pay

## 2023-12-12 ENCOUNTER — Telehealth (HOSPITAL_COMMUNITY): Payer: Self-pay | Admitting: Pharmacy Technician

## 2023-12-12 ENCOUNTER — Telehealth: Payer: Self-pay | Admitting: Hematology and Oncology

## 2023-12-12 ENCOUNTER — Encounter: Payer: Self-pay | Admitting: Hematology and Oncology

## 2023-12-12 DIAGNOSIS — K55069 Acute infarction of intestine, part and extent unspecified: Secondary | ICD-10-CM | POA: Diagnosis not present

## 2023-12-12 LAB — BASIC METABOLIC PANEL WITH GFR
Anion gap: 8 (ref 5–15)
BUN: 9 mg/dL (ref 6–20)
CO2: 21 mmol/L — ABNORMAL LOW (ref 22–32)
Calcium: 8.6 mg/dL — ABNORMAL LOW (ref 8.9–10.3)
Chloride: 108 mmol/L (ref 98–111)
Creatinine, Ser: 0.67 mg/dL (ref 0.44–1.00)
GFR, Estimated: >60 mL/min (ref >60)
Glucose, Bld: 105 mg/dL — ABNORMAL HIGH (ref 70–99)
Potassium: 3.4 mmol/L — ABNORMAL LOW (ref 3.5–5.1)
Sodium: 137 mmol/L (ref 135–145)

## 2023-12-12 LAB — CBC
HCT: 34.1 % — ABNORMAL LOW (ref 36.0–46.0)
HCT: 33.6 % — ABNORMAL LOW (ref 36.0–46.0)
Hemoglobin: 11.6 g/dL — ABNORMAL LOW (ref 12.0–15.0)
Hemoglobin: 11.2 g/dL — ABNORMAL LOW (ref 12.0–15.0)
MCH: 30.3 pg (ref 26.0–34.0)
MCH: 30 pg (ref 26.0–34.0)
MCHC: 34 g/dL (ref 30.0–36.0)
MCHC: 33.3 g/dL (ref 30.0–36.0)
MCV: 89 fL (ref 80.0–100.0)
MCV: 90.1 fL (ref 80.0–100.0)
Platelets: 180 10*3/uL (ref 150–400)
Platelets: 181 10*3/uL (ref 150–400)
RBC: 3.83 MIL/uL — ABNORMAL LOW (ref 3.87–5.11)
RBC: 3.73 MIL/uL — ABNORMAL LOW (ref 3.87–5.11)
RDW: 13 % (ref 11.5–15.5)
RDW: 13.2 % (ref 11.5–15.5)
WBC: 12.2 10*3/uL — ABNORMAL HIGH (ref 4.0–10.5)
WBC: 11.1 10*3/uL — ABNORMAL HIGH (ref 4.0–10.5)
nRBC: 0 % (ref 0.0–0.2)
nRBC: 0 % (ref 0.0–0.2)

## 2023-12-12 LAB — HEPARIN LEVEL (UNFRACTIONATED)
Heparin Unfractionated: 0.7 [IU]/mL (ref 0.30–0.70)
Heparin Unfractionated: 0.93 [IU]/mL — ABNORMAL HIGH (ref 0.30–0.70)

## 2023-12-12 LAB — PROTIME-INR
INR: 1.1 (ref 0.8–1.2)
Prothrombin Time: 14.6 s (ref 11.4–15.2)

## 2023-12-12 MED ORDER — APIXABAN 5 MG PO TABS
10.0000 mg | ORAL_TABLET | Freq: Two times a day (BID) | ORAL | Status: DC
  Administered 2023-12-12: 10 mg via ORAL
  Filled 2023-12-12: qty 2

## 2023-12-12 MED ORDER — APIXABAN 5 MG PO TABS: 5.0000 mg | ORAL_TABLET | Freq: Two times a day (BID) | ORAL | Status: DC

## 2023-12-12 MED ORDER — HEPARIN SOD (PORK) LOCK FLUSH 100 UNIT/ML IV SOLN
500.0000 [IU] | Freq: Once | INTRAVENOUS | Status: AC
  Administered 2023-12-12: 500 [IU] via INTRAVENOUS
  Filled 2023-12-12: qty 5

## 2023-12-12 MED ORDER — APIXABAN (ELIQUIS) VTE STARTER PACK (10MG AND 5MG)
ORAL_TABLET | ORAL | 0 refills | Status: DC
Start: 2023-12-12 — End: 2024-01-09
  Filled 2023-12-12: qty 74, 30d supply, fill #0

## 2023-12-12 MED ORDER — POTASSIUM CHLORIDE CRYS ER 20 MEQ PO TBCR
40.0000 meq | EXTENDED_RELEASE_TABLET | Freq: Once | ORAL | Status: AC
Start: 2023-12-12 — End: 2023-12-12
  Administered 2023-12-12: 40 meq via ORAL
  Filled 2023-12-12: qty 2

## 2023-12-12 MED ORDER — POLYETHYLENE GLYCOL 3350 17 GM/SCOOP PO POWD
17.0000 g | Freq: Every day | ORAL | 0 refills | Status: AC
  Filled 2023-12-12: qty 238, 14d supply, fill #0

## 2023-12-12 MED ORDER — APIXABAN 5 MG PO TABS
ORAL_TABLET | ORAL | 0 refills | Status: DC
  Filled 2023-12-12: qty 60, 23d supply, fill #0

## 2023-12-12 NOTE — Progress Notes (Addendum)
 PHARMACY - ANTICOAGULATION CONSULT NOTE  Pharmacy Consult for IV heparin  Indication: DVT  Allergies  Allergen Reactions   Codeine Itching and Hives   Hydrocodone  Other (See Comments)   Latex Hives   Lisinopril  Cough   Tomato Itching    Reaction to canned tomatoes    Patient Measurements: Height: 5' 2.5 (158.8 cm) Weight: 69.9 kg (154 lb) IBW/kg (Calculated) : 51.25 HEPARIN  DW (KG): 65.8  Vital Signs: Temp: 98.9 F (37.2 C) (06/30 0425) Temp Source: Oral (06/30 0425) BP: 103/82 (06/30 0425) Pulse Rate: 76 (06/30 0425)  Labs: Recent Labs    12/11/23 1105 12/11/23 1654 12/12/23 0053 12/12/23 0353 12/12/23 0745  HGB 11.8*  --  11.6* 11.2*  --   HCT 34.8*  --  34.1* 33.6*  --   PLT 166  --  180 181  --   LABPROT  --   --  14.6  --   --   INR  --   --  1.1  --   --   HEPARINUNFRC  --  0.90* 0.70  --  0.93*  CREATININE 0.75  --  0.67  --   --     Estimated Creatinine Clearance: 77.1 mL/min (by C-G formula based on SCr of 0.67 mg/dL).   Medical History: Past Medical History:  Diagnosis Date   Allergy    Breast cancer (HCC)    Breast cancer (HCC)    right   History of chemotherapy    doxetaxel/carboplatin /trastuzumab    History of migraine    last one about a week ago   Hx of radiation therapy 01/01/13- 02/15/13   r chest wall, r supraclav/axilla 5040 cGy/28 sessions, scar boost 1000 cGy/5 sessions   Hypertension    no meds,   urgent care on pomana   Migraine    Migraine     Medications:  Medications Prior to Admission  Medication Sig Dispense Refill Last Dose/Taking   acetaminophen  (TYLENOL ) 500 MG tablet Take 1,000 mg by mouth every 6 (six) hours as needed for moderate pain.   Unknown   amLODipine  (NORVASC ) 10 MG tablet Take 1 tablet (10 mg total) by mouth every morning. 30 tablet 3 12/11/2023   carvedilol  (COREG ) 3.125 MG tablet TAKE 1 TABLET BY MOUTH TWICE A DAY WITH A MEAL 180 tablet 1 12/11/2023   cholecalciferol (VITAMIN D3) 25 MCG (1000 UNIT) tablet  Take 1,000 Units by mouth daily.   12/11/2023   dexamethasone  (DECADRON ) 4 MG tablet Take 2 tablets (8 mg) by mouth daily for 3 days starting the day after chemotherapy. Take with food. 30 tablet 1 Past Week   KLOR-CON  M20 20 MEQ tablet TAKE 1 TABLET BY MOUTH EVERY DAY (Patient taking differently: Take 20 mEq by mouth 2 (two) times daily.) 90 tablet 1 12/11/2023   lidocaine -prilocaine  (EMLA ) cream Apply 1 Application topically as needed. 30 g 0 Past Week   MAGNESIUM PO Take 1 tablet by mouth every evening.   Past Week   OLANZapine  (ZYPREXA ) 2.5 MG tablet TAKE 1 TABLET BY MOUTH AT BEDTIME. 90 tablet 1 12/10/2023   ondansetron  (ZOFRAN ) 8 MG tablet Take 1 tablet (8 mg total) by mouth every 8 (eight) hours as needed for nausea or vomiting. Start on the third day after chemotherapy. 30 tablet 1 Unknown   prochlorperazine  (COMPAZINE ) 10 MG tablet Take 1 tablet (10 mg total) by mouth every 6 (six) hours as needed for nausea or vomiting. 30 tablet 1 Unknown   tamoxifen  (NOLVADEX ) 20 MG tablet Take  20 mg by mouth daily. 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 Pt not on blood thinners PTA. Pt PMH includes metastastic breast cancer.   Today, 12/12/23 Heparin  level 0.93 - supratherapeutic on heparin  1100 units/hr RN confirmed heparin  currently infusing via port; labs drawn peripherally. RN did mention that heparin  will soon be changed to infusing through peripheral line as opposed to port. Hgb 11.2, plt 181 - Hgb slightly low but stable, pltc WNL  No bleeding or infusion related concerns reported  Goal of Therapy:  Heparin  level 0.3-0.7 units/ml Monitor platelets by anticoagulation protocol: Yes   Plan:  Decrease heparin  gtt rate to 950 units/hr  Check  heparin  level in 6 hours Daily CBC and HL Monitor for signs and symptoms of bleeding F/U plans for long-term anticoagulation   Lacinda Moats, PharmD Clinical Pharmacist  6/30/20258:30 AM

## 2023-12-12 NOTE — Telephone Encounter (Signed)
 Patient Product/process development scientist completed.    The patient is insured through [employer]. Patient has [employer], may use a copay card, and/or apply for patient assistance if available.    Ran test claim for Eliquis 5 mg and the current 30 day co-pay is $75.00.  Ran test claim for Xarelto  20 mg and the current 30 day co-pay is $75.00.  This test claim was processed through [facility]- copay amounts may vary at other pharmacies due to pharmacy/plan contracts, or as the patient moves through the different stages of their insurance plan.     Morgan Arab, CPHT Pharmacy Technician III Certified Patient Advocate [facility] Patient Advocate Team Direct Number: [phone]  Fax: [phone]

## 2023-12-12 NOTE — Progress Notes (Signed)
   12/12/23 0023  Assess: MEWS Score  Temp (!) 101.3 F (38.5 C)  BP 112/83  MAP (mmHg) 93  Resp 17  SpO2 99 %  Assess: MEWS Score  MEWS Temp 1  MEWS Systolic 0  MEWS Pulse 1  MEWS RR 0  MEWS LOC 0  MEWS Score 2  MEWS Score Color Yellow  Assess: if the MEWS score is Yellow or Red  Were vital signs accurate and taken at a resting state? Yes  Does the patient meet 2 or more of the SIRS criteria? Yes  Does the patient have a confirmed or suspected source of infection? No  Notify: Charge Nurse/RN  Name of Charge Nurse/RN Notified Self  Provider Notification  Provider Name/Title Blondie  Date Provider Notified 12/12/23  Time Provider Notified 0025  Method of Notification Page  Notification Reason Change in status;Other (Comment) (temp, which threw patient in yellow MEWs)  Provider response See new orders  Date of Provider Response 12/12/23  Time of Provider Response 0041  Notify: Rapid Response  Name of Rapid Response RN Notified will notify if remains yellow after interventions  Date Rapid Response Notified  (na)  Time Rapid Response Notified  (na)  Assess: SIRS CRITERIA  SIRS Temperature  1  SIRS Respirations  0  SIRS Pulse 1  SIRS WBC 0  SIRS Score Sum  2

## 2023-12-12 NOTE — Discharge Summary (Signed)
 Physician Discharge Summary  Cosandra Barry FMW:[id number] DOB: [birth year] DOA: 12/11/2023  PCP: [name], MD  Admit date: 12/11/2023 Discharge date: 12/12/2023  Admitted From: Home Disposition:  Home  Discharge Condition:Stable CODE STATUS:FULL Diet recommendation:  Regular   Brief/Interim Summary: Sherry Barry is a 53 y.o. female with medical history significant of stage IV triple positive breast cancer, hypertension who presented to the emergency department as per recommendation from her oncologist. 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  She was prompted to the emergency room for the initiation of anticoagulation therapy.  On presentation, she was hemodynamically stable.  She not complain of any abdominal pain.  She was started on IV heparin .  This morning she remains hemodynamically stable, comfortable.  After discussing with her oncologist, Dr. Loretha, we decided to discharge her on Eliquis.  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  CT abdomen/pelvis showed  extensive thrombosis of the superior mesenteric vein, main portal vein, and intrahepatic portal venous system.   After discussion with oncology, there is no emergent need to consult vascular surgery.  Initially on heparin , now transitioned to Eliquis.  Her oncologist will follow-up with her at office as soon as possible.   Metastatic breast cancer: Stage IV breast cancer.  Previously  on tamoxifen .  Follows with oncology.  CT chest/abdomen/pelvis did not show any new evidence of metastatic disease in the chest, abdomen, or pelvis.She is status post bilateral mastectomy with implant  reconstruction.   Hypokalemia: Supplemented with potassium   History of hypertension: Takes amlodipine  and Coreg  at home.  Currently blood pressure stable.  This medications are on hold   Constipation: Continue bowel regimen    Discharge Diagnoses:  Principal Problem:   Mesenteric vein thrombosis (HCC) Active Problems:   Essential hypertension   Malignant neoplasm of upper-inner quadrant of right breast in female, estrogen receptor positive [facility])    Discharge Instructions  Discharge Instructions     Diet general   Complete by: As directed    Discharge instructions   Complete by: As directed    1)Please take your medications as instructed 2)Your blood pressure is on the lower side.  We have discontinued amlodipine  and Coreg  for now.  If your blood pressure starts trending up, you may need to take those.  Follow-up with your PCP 3)Follow up with your oncologist in a week   Increase activity slowly   Complete by: As directed       Allergies as of 12/12/2023       Reactions   Codeine Itching, Hives   Hydrocodone  Other (See Comments)   Latex Hives   Lisinopril  Cough   Tomato Itching   Reaction to canned tomatoes        Medication List     STOP taking these medications    amLODipine  10 MG tablet Commonly known as: NORVASC    carvedilol  3.125 MG tablet Commonly known as: COREG    tamoxifen  20 MG tablet Commonly known as: NOLVADEX        TAKE these medications    acetaminophen  500 MG tablet Commonly known as: TYLENOL  Take 1,000 mg by mouth every 6 (six) hours as needed for moderate pain.   apixaban 5 MG Tabs tablet Commonly known as: ELIQUIS Take  2 tablets (10 mg total) by mouth 2 (two) times daily for 7 days, THEN 1 tablet (5 mg total) 2 (two) times daily. Start taking on: December 12, 2023   cholecalciferol 25 MCG (1000 UNIT) tablet Commonly known as: VITAMIN D3 Take 1,000 Units by mouth daily.   dexamethasone  4 MG tablet Commonly known as:  DECADRON  Take 2 tablets (8 mg) by mouth daily for 3 days starting the day after chemotherapy. Take with food.   Klor-Con  M20 20 MEQ tablet Generic drug: potassium chloride  SA TAKE 1 TABLET BY MOUTH EVERY DAY What changed: when to take this   lidocaine -prilocaine  cream Commonly known as: EMLA  Apply 1 Application topically as needed.   MAGNESIUM PO Take 1 tablet by mouth every evening.   OLANZapine  2.5 MG tablet Commonly known as: ZYPREXA  TAKE 1 TABLET BY MOUTH AT BEDTIME.   ondansetron  8 MG tablet Commonly known as: Zofran  Take 1 tablet (8 mg total) by mouth every 8 (eight) hours as needed for nausea or vomiting. Start on the third day after chemotherapy.   polyethylene glycol 17 g packet Commonly known as: MIRALAX / GLYCOLAX Take 17 g by mouth daily. Start taking on: December 13, 2023   prochlorperazine  10 MG tablet Commonly known as: COMPAZINE  Take 1 tablet (10 mg total) by mouth every 6 (six) hours as needed for nausea or vomiting.        Follow-up Information     Madden, Evan J, MD. Schedule an appointment as soon as possible for a visit in 1 week(s).   Specialty: Family Medicine Contact information: [address] [place] [address] [phone]                Allergies  Allergen Reactions   Codeine Itching and Hives   Hydrocodone  Other (See Comments)   Latex Hives   Lisinopril  Cough   Tomato Itching    Reaction to canned tomatoes    Consultations: Oncology   Procedures/Studies: CT CHEST ABDOMEN PELVIS W CONTRAST Result Date: 12/11/2023 CLINICAL DATA:  Metastatic breast cancer restaging * Tracking Code: BO * EXAM: CT CHEST, ABDOMEN, AND PELVIS WITH CONTRAST TECHNIQUE: Multidetector CT imaging of the chest, abdomen and pelvis was performed following the standard protocol during bolus administration of intravenous contrast. RADIATION DOSE REDUCTION: This exam was performed according to the departmental dose-optimization program which includes  automated exposure control, adjustment of the mA and/or kV according to patient size and/or use of iterative reconstruction technique. CONTRAST:  OMNIPAQUE  IOHEXOL  300 MG/ML  SOLN COMPARISON:  09/22/2023 FINDINGS: CT CHEST FINDINGS Cardiovascular: Right chest port catheter. Normal heart size. No pericardial effusion. Mediastinum/Nodes: No enlarged mediastinal, hilar, or axillary lymph nodes. Small hiatal hernia. Thyroid  gland, trachea, and esophagus demonstrate no significant findings. Lungs/Pleura: Trace, residual loculated left pleural effusion, diminished in volume compared to prior examination. Diminished size of multiple left-sided pleural nodules, for example in the dependent left lower lobe measuring 0.7 cm, previously 1.4 x 0.8 cm (series 4, image 93) and in the medial inferior left lower lobe measuring 1.3 x 1.2 cm, previously 1.6 x 1.5 cm (series 4, image 118). Musculoskeletal: Status post bilateral mastectomy with implant reconstruction. No acute osseous findings. CT ABDOMEN PELVIS FINDINGS Hepatobiliary: No solid liver abnormality is seen. No gallstones, gallbladder wall thickening, or biliary dilatation. Pancreas: Unremarkable. No pancreatic ductal dilatation or surrounding inflammatory changes. Spleen: Normal in size without significant abnormality. Adrenals/Urinary Tract: Adrenal glands are unremarkable. Kidneys are normal, without renal calculi, solid lesion, or hydronephrosis. Bladder is  unremarkable. Stomach/Bowel: Stomach is within normal limits. Appendix appears normal. No evidence of bowel wall thickening, distention, or inflammatory changes. Vascular/Lymphatic: New, extensive thrombosis of the superior mesenteric vein, main portal vein, and intrahepatic portal venous system (series 2, image 48, 53, 66). Mild, early variceal collateralization about the left upper quadrant. No enlarged abdominal or pelvic lymph nodes. Reproductive: Status post hysterectomy. Other: No abdominal wall hernia  or abnormality. No ascites. Musculoskeletal: No acute osseous findings. Unchanged occasional sclerotic vertebral body metastases, notably of T9, T11, L2, and L3 (series 6, image 104). IMPRESSION: 1. New, extensive thrombosis of the superior mesenteric vein, main portal vein, and intrahepatic portal venous system. Mild, early variceal collateralization about the left upper quadrant. 2. Diminished size of multiple left-sided pleural nodules and trace, residual loculated left pleural effusion, consistent with treatment response of pleural metastatic disease. 3. Unchanged sclerotic vertebral body metastases. 4. No new evidence of metastatic disease in the chest, abdomen, or pelvis. 5. Status post bilateral mastectomy with implant reconstruction. Electronically Signed   By: Marolyn JONETTA Jaksch M.D.   On: 12/11/2023 07:04      Subjective: Patient seen and examined at bedside today.  Hemodynamically stable.  Very comfortable.  Had a bowel movement yesterday.  Denies any abdominal pain.  Feels ready to go home  Discharge Exam: Vitals:   12/12/23 0023 12/12/23 0425  BP: 112/83 103/82  Pulse:  76  Resp: 17 16  Temp: (!) 101.3 F (38.5 C) 98.9 F (37.2 C)  SpO2: 99% 100%   Vitals:   12/11/23 1534 12/11/23 2013 12/12/23 0023 12/12/23 0425  BP: (!) 131/98 106/85 112/83 103/82  Pulse: 93 (!) 102  76  Resp: 16 18 17 16   Temp: 99 F (37.2 C) 100.3 F (37.9 C) (!) 101.3 F (38.5 C) 98.9 F (37.2 C)  TempSrc: Oral Oral Oral Oral  SpO2: 99% 97% 99% 100%  Weight:      Height:        General: Pt is alert, awake, not in acute distress Cardiovascular: RRR, S1/S2 +, no rubs, no gallops Respiratory: CTA bilaterally, no wheezing, no rhonchi Abdominal: Soft, NT, ND, bowel sounds + Extremities: no edema, no cyanosis    The results of significant diagnostics from this hospitalization (including imaging, microbiology, ancillary and laboratory) are listed below for reference.     Microbiology: No results  found for this or any previous visit (from the past 240 hours).   Labs: BNP (last 3 results) No results for input(s): BNP in the last 8760 hours. Basic Metabolic Panel: Recent Labs  Lab 12/11/23 1105 12/12/23 0053  NA 138 137  K 3.2* 3.4*  CL 107 108  CO2 22 21*  GLUCOSE 120* 105*  BUN 9 9  CREATININE 0.75 0.67  CALCIUM 8.7* 8.6*   Liver Function Tests: Recent Labs  Lab 12/11/23 1105  AST 19  ALT 22  ALKPHOS 51  BILITOT 0.6  PROT 6.4*  ALBUMIN 3.2*   No results for input(s): LIPASE, AMYLASE in the last 168 hours. No results for input(s): AMMONIA in the last 168 hours. CBC: Recent Labs  Lab 12/11/23 1105 12/12/23 0053 12/12/23 0353  WBC 10.7* 12.2* 11.1*  NEUTROABS 8.1*  --   --   HGB 11.8* 11.6* 11.2*  HCT 34.8* 34.1* 33.6*  MCV 89.7 89.0 90.1  PLT 166 180 181   Cardiac Enzymes: No results for input(s): CKTOTAL, CKMB, CKMBINDEX, TROPONINI in the last 168 hours. BNP: Invalid input(s): POCBNP CBG: No results for input(s): GLUCAP  in the last 168 hours. D-Dimer No results for input(s): DDIMER in the last 72 hours. Hgb A1c No results for input(s): HGBA1C in the last 72 hours. Lipid Profile No results for input(s): CHOL, HDL, LDLCALC, TRIG, CHOLHDL, LDLDIRECT in the last 72 hours. Thyroid  function studies No results for input(s): TSH, T4TOTAL, T3FREE, THYROIDAB in the last 72 hours.  Invalid input(s): FREET3 Anemia work up No results for input(s): VITAMINB12, FOLATE, FERRITIN, TIBC, IRON, RETICCTPCT in the last 72 hours. Urinalysis    Component Value Date/Time   COLORURINE STRAW (A) 10/17/2021 2142   APPEARANCEUR CLEAR 10/17/2021 2142   LABSPEC 1.003 (L) 10/17/2021 2142   PHURINE 7.0 10/17/2021 2142   GLUCOSEU NEGATIVE 10/17/2021 2142   HGBUR SMALL (A) 10/17/2021 2142   BILIRUBINUR NEGATIVE 10/17/2021 2142   KETONESUR NEGATIVE 10/17/2021 2142   PROTEINUR NEGATIVE 10/17/2021 2142   NITRITE  NEGATIVE 10/17/2021 2142   LEUKOCYTESUR TRACE (A) 10/17/2021 2142   Sepsis Labs Recent Labs  Lab 12/11/23 1105 12/12/23 0053 12/12/23 0353  WBC 10.7* 12.2* 11.1*   Microbiology No results found for this or any previous visit (from the past 240 hours).  Please note: You were cared for by a hospitalist during your hospital stay. Once you are discharged, your primary care physician will handle any further medical issues. Please note that NO REFILLS for any discharge medications will be authorized once you are discharged, as it is imperative that you return to your primary care physician (or establish a relationship with a primary care physician if you do not have one) for your post hospital discharge needs so that they can reassess your need for medications and monitor your lab values.    Time coordinating discharge: 40 minutes  SIGNED:   Ivonne Mustache, MD  [facility] 12/12/2023, 1:26 PM Pager [phone]  If 7PM-7AM, please contact night-coverage [email or web address] Password [email or web address]

## 2023-12-12 NOTE — Plan of Care (Addendum)
 Patient educated on after visit summary. Heparin  gtt discontinued. IV removed, and all questions answered. Pt d/c home, no further needs.  Problem: Education: Goal: Knowledge of General Education information will improve Description: Including pain rating scale, medication(s)/side effects and non-pharmacologic comfort measures Outcome: Adequate for Discharge   Problem: Health Behavior/Discharge Planning: Goal: Ability to manage health-related needs will improve Outcome: Adequate for Discharge   Problem: Clinical Measurements: Goal: Ability to maintain clinical measurements within normal limits will improve Outcome: Adequate for Discharge Goal: Will remain free from infection Outcome: Adequate for Discharge Goal: Diagnostic test results will improve Outcome: Adequate for Discharge Goal: Respiratory complications will improve Outcome: Adequate for Discharge Goal: Cardiovascular complication will be avoided Outcome: Adequate for Discharge   Problem: Activity: Goal: Risk for activity intolerance will decrease Outcome: Adequate for Discharge   Problem: Nutrition: Goal: Adequate nutrition will be maintained Outcome: Adequate for Discharge   Problem: Coping: Goal: Level of anxiety will decrease Outcome: Adequate for Discharge   Problem: Elimination: Goal: Will not experience complications related to bowel motility Outcome: Adequate for Discharge Goal: Will not experience complications related to urinary retention Outcome: Adequate for Discharge   Problem: Pain Managment: Goal: General experience of comfort will improve and/or be controlled Outcome: Adequate for Discharge   Problem: Safety: Goal: Ability to remain free from injury will improve Outcome: Adequate for Discharge   Problem: Skin Integrity: Goal: Risk for impaired skin integrity will decrease Outcome: Adequate for Discharge   Problem: Consults Goal: Venous Thromboembolism Patient Education Description: See  Patient Education Module for education specifics. Outcome: Adequate for Discharge Goal: Diagnosis - Venous Thromboembolism (VTE) Description: Choose a selection Outcome: Adequate for Discharge Goal: Pharmacy Consult for anticoagulation Outcome: Adequate for Discharge Goal: Skin Care Protocol Initiated - if Braden Score 18 or less Description: If consults are not indicated, leave blank or document N/A Outcome: Adequate for Discharge Goal: Nutrition Consult-if indicated Outcome: Adequate for Discharge Goal: Diabetes Guidelines if Diabetic/Glucose > 140 Description: If diabetic or lab glucose is > 140 mg/dl - Initiate Diabetes/Hyperglycemia Guidelines & Document Interventions  Outcome: Adequate for Discharge   Problem: Phase I Progression Outcomes Goal: Pain controlled with appropriate interventions Outcome: Adequate for Discharge Goal: Dyspnea controlled at rest (PE) Outcome: Adequate for Discharge Goal: Tolerating diet Outcome: Adequate for Discharge Goal: Initial discharge plan identified Outcome: Adequate for Discharge Goal: Voiding-avoid urinary catheter unless indicated Outcome: Adequate for Discharge Goal: Hemodynamically stable Outcome: Adequate for Discharge Goal: Other Phase I Outcomes/Goals Outcome: Adequate for Discharge   Problem: Phase II Progression Outcomes Goal: Therapeutic drug levels for anticoagulation Outcome: Adequate for Discharge Goal: 02 sats trending upward/stable (PE) Outcome: Adequate for Discharge Goal: Discharge plan established Outcome: Adequate for Discharge Goal: Tolerating diet Outcome: Adequate for Discharge Goal: Other Phase II Outcomes/Goals Outcome: Adequate for Discharge   Problem: Phase III Progression Outcomes Goal: 02 sats stabilized Outcome: Adequate for Discharge Goal: Activity at appropriate level-compared to baseline Description: (UP IN CHAIR FOR HEMODIALYSIS) Outcome: Adequate for Discharge Goal: Discharge plan remains  appropriate-arrangements made Outcome: Adequate for Discharge Goal: Other Phase III Outcomes/Goals Outcome: Adequate for Discharge   Problem: Discharge Progression Outcomes Goal: Barriers To Progression Addressed/Resolved Outcome: Adequate for Discharge Goal: Discharge plan in place and appropriate Outcome: Adequate for Discharge Goal: Pain controlled with appropriate interventions Outcome: Adequate for Discharge Goal: Hemodynamically stable Outcome: Adequate for Discharge Goal: Complications resolved/controlled Outcome: Adequate for Discharge Goal: Tolerating diet Outcome: Adequate for Discharge Goal: Activity appropriate for discharge plan Outcome: Adequate for Discharge Goal: Other  Discharge Outcomes/Goals Outcome: Adequate for Discharge

## 2023-12-12 NOTE — Telephone Encounter (Signed)
 Spoke with patient confirming upcoming appointment

## 2023-12-12 NOTE — Progress Notes (Signed)
 PHARMACY - ANTICOAGULATION CONSULT NOTE  Pharmacy Consult for IV heparin  Indication: DVT  Allergies  Allergen Reactions   Codeine Itching and Hives   Hydrocodone  Other (See Comments)   Latex Hives   Lisinopril  Cough   Tomato Itching    Reaction to canned tomatoes    Patient Measurements: Height: 5' 2.5 (158.8 cm) Weight: 69.9 kg (154 lb) IBW/kg (Calculated) : 51.25 HEPARIN  DW (KG): 65.8  Vital Signs: Temp: 101.3 F (38.5 C) (06/30 0023) Temp Source: Oral (06/30 0023) BP: 112/83 (06/30 0023) Pulse Rate: 102 (06/29 2013)  Labs: Recent Labs    12/11/23 1105 12/11/23 1654 12/12/23 0053  HGB 11.8*  --  11.6*  HCT 34.8*  --  34.1*  PLT 166  --  180  LABPROT  --   --  14.6  INR  --   --  1.1  HEPARINUNFRC  --  0.90* 0.70  CREATININE 0.75  --  0.67    Estimated Creatinine Clearance: 77.1 mL/min (by C-G formula based on SCr of 0.67 mg/dL).   Medical History: Past Medical History:  Diagnosis Date   Allergy    Breast cancer (HCC)    Breast cancer (HCC)    right   History of chemotherapy    doxetaxel/carboplatin /trastuzumab    History of migraine    last one about a week ago   Hx of radiation therapy 01/01/13- 02/15/13   r chest wall, r supraclav/axilla 5040 cGy/28 sessions, scar boost 1000 cGy/5 sessions   Hypertension    no meds,   urgent care on pomana   Migraine    Migraine     Medications:  Medications Prior to Admission  Medication Sig Dispense Refill Last Dose/Taking   acetaminophen  (TYLENOL ) 500 MG tablet Take 1,000 mg by mouth every 6 (six) hours as needed for moderate pain.   Unknown   amLODipine  (NORVASC ) 10 MG tablet Take 1 tablet (10 mg total) by mouth every morning. 30 tablet 3 12/11/2023   carvedilol  (COREG ) 3.125 MG tablet TAKE 1 TABLET BY MOUTH TWICE A DAY WITH A MEAL 180 tablet 1 12/11/2023   cholecalciferol (VITAMIN D3) 25 MCG (1000 UNIT) tablet Take 1,000 Units by mouth daily.   12/11/2023   dexamethasone  (DECADRON ) 4 MG tablet Take 2 tablets  (8 mg) by mouth daily for 3 days starting the day after chemotherapy. Take with food. 30 tablet 1 Past Week   KLOR-CON  M20 20 MEQ tablet TAKE 1 TABLET BY MOUTH EVERY DAY (Patient taking differently: Take 20 mEq by mouth 2 (two) times daily.) 90 tablet 1 12/11/2023   lidocaine -prilocaine  (EMLA ) cream Apply 1 Application topically as needed. 30 g 0 Past Week   MAGNESIUM PO Take 1 tablet by mouth every evening.   Past Week   OLANZapine  (ZYPREXA ) 2.5 MG tablet TAKE 1 TABLET BY MOUTH AT BEDTIME. 90 tablet 1 12/10/2023   ondansetron  (ZOFRAN ) 8 MG tablet Take 1 tablet (8 mg total) by mouth every 8 (eight) hours as needed for nausea or vomiting. Start on the third day after chemotherapy. 30 tablet 1 Unknown   prochlorperazine  (COMPAZINE ) 10 MG tablet Take 1 tablet (10 mg total) by mouth every 6 (six) hours as needed for nausea or vomiting. 30 tablet 1 Unknown   tamoxifen  (NOLVADEX ) 20 MG tablet Take 20 mg by mouth daily. 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 CT scan performed on  6/27 indicates extensive clots of the superior mesenteric vein, main portal vein, and intrahepatic portal venous system. Pt not on blood thinners PTA. Pt PMH includes metastastic breast cancer.   Today, 12/12/23 Repeat heparin  level on 1100 units/hr of IV heparin  is therapeutic at 0.7 Heparin  infusing via port; labs drawn peripherally Hgb 11.6, plt 180 - Hg slightly low but stable, pltc WNL  No bleeding or infusion related concerns reported by RN  Goal of Therapy:  Heparin  level 0.3-0.7 units/ml Monitor platelets by anticoagulation protocol: Yes   Plan:  Continue heparin  1100 units/hr  Check confirmatory heparin  level in 6 hours Daily CBC and HL Monitor for signs and symptoms of bleeding F/U plans for long-term anticoagulation  Rosaline Millet, PharmD, BCPS 12/12/2023 1:38 AM

## 2023-12-12 NOTE — Discharge Instructions (Addendum)
 Information on my medicine - ELIQUIS (apixaban)  Why was Eliquis prescribed for you? Eliquis was prescribed to treat blood clots that may have been found in the veins of your legs (deep vein thrombosis) or in your lungs (pulmonary embolism) and to reduce the risk of them occurring again.  What do You need to know about Eliquis ? The starting dose is 10 mg (two 5 mg tablets) taken TWICE daily for the FIRST SEVEN (7) DAYS, then on 7/7  the dose is reduced to ONE 5 mg tablet taken TWICE daily.  Eliquis may be taken with or without food.   Try to take the dose about the same time in the morning and in the evening. If you have difficulty swallowing the tablet whole please discuss with your pharmacist how to take the medication safely.  Take Eliquis exactly as prescribed and DO NOT stop taking Eliquis without talking to the doctor who prescribed the medication.  Stopping may increase your risk of developing a new blood clot.  Refill your prescription before you run out.  After discharge, you should have regular check-up appointments with your healthcare provider that is prescribing your Eliquis.    What do you do if you miss a dose? If a dose of ELIQUIS is not taken at the scheduled time, take it as soon as possible on the same day and twice-daily administration should be resumed. The dose should not be doubled to make up for a missed dose.  Important Safety Information A possible side effect of Eliquis is bleeding. You should call your healthcare provider right away if you experience any of the following: Bleeding from an injury or your nose that does not stop. Unusual colored urine (red or dark brown) or unusual colored stools (red or black). Unusual bruising for unknown reasons. A serious fall or if you hit your head (even if there is no bleeding).  Some medicines may interact with Eliquis and might increase your risk of bleeding or clotting while on Eliquis. To help avoid this,  consult your healthcare provider or pharmacist prior to using any new prescription or non-prescription medications, including herbals, vitamins, non-steroidal anti-inflammatory drugs (NSAIDs) and supplements.  This website has more information on Eliquis (apixaban): http://[email or web address].com/eliquis/home

## 2023-12-14 ENCOUNTER — Telehealth: Payer: Self-pay

## 2023-12-14 NOTE — Telephone Encounter (Signed)
 Pt verbally confirmed appt for 7/3

## 2023-12-15 ENCOUNTER — Inpatient Hospital Stay: Attending: Hematology and Oncology | Admitting: Hematology and Oncology

## 2023-12-15 VITALS — BP 117/94 | HR 107 | Temp 98.7°F | Resp 18 | Wt 152.9 lb

## 2023-12-15 DIAGNOSIS — Z170 Estrogen receptor positive status [ER+]: Secondary | ICD-10-CM | POA: Diagnosis not present

## 2023-12-15 DIAGNOSIS — C7951 Secondary malignant neoplasm of bone: Secondary | ICD-10-CM | POA: Diagnosis not present

## 2023-12-15 DIAGNOSIS — Z1732 Human epidermal growth factor receptor 2 negative status: Secondary | ICD-10-CM | POA: Insufficient documentation

## 2023-12-15 DIAGNOSIS — Z90710 Acquired absence of both cervix and uterus: Secondary | ICD-10-CM | POA: Diagnosis not present

## 2023-12-15 DIAGNOSIS — Z5112 Encounter for antineoplastic immunotherapy: Secondary | ICD-10-CM | POA: Diagnosis not present

## 2023-12-15 DIAGNOSIS — I81 Portal vein thrombosis: Secondary | ICD-10-CM | POA: Diagnosis not present

## 2023-12-15 DIAGNOSIS — C50211 Malignant neoplasm of upper-inner quadrant of right female breast: Secondary | ICD-10-CM | POA: Diagnosis not present

## 2023-12-15 DIAGNOSIS — Z1721 Progesterone receptor positive status: Secondary | ICD-10-CM | POA: Insufficient documentation

## 2023-12-15 NOTE — Progress Notes (Signed)
 Patient Care Team: Sherry Artist PARAS, MD as PCP - General (Family Medicine) Sherry Gain, MD as Consulting Physician (Obstetrics and Gynecology) Sherry Ash, MD as Consulting Physician (Hematology and Oncology)  DIAGNOSIS:  No diagnosis found.   SUMMARY OF ONCOLOGIC HISTORY: Oncology History  Malignant neoplasm of upper-inner quadrant of right breast in female, estrogen receptor positive (HCC)  07/13/2012 Clinical Stage   Stage IA: T1c N0   07/14/2012 Definitive Surgery   Bilateral mastectomy/right SLNB: RIGHT invasive ductal carcinoma, grade 3, ER+, PR +, Her2/neu positive (ratio 3.02), Ki67 48%. DCIS. 1/4 LN positive for malignancy. LEFT: benign   07/14/2012 Pathologic Stage   Stage IIB: T2 N1a M0   07/14/2012 Cancer Staging   Staging form: Breast, AJCC 7th Edition - Pathologic stage from 07/14/2012: Stage IV (TX, NX, M1) - Signed by Sherry Ash, MD on 08/28/2021 Specimen type: Core Needle Biopsy Histopathologic type: 9931 Laterality: Right   08/17/2012 - 08/30/2013 Chemotherapy   Adjuvant carboplatin , docetaxel , and trastuzumab  x 6 cycles (completed 11/30/2012) followed by maintenance trastuzumab  to total one year   11/2012 Procedure   Comp Cancer Gene panel ([facility]) negative for deleterious mutations    - 02/2013 Radiation Therapy   Adjuvant RT to right breast   02/2013 -  Anti-estrogen oral therapy   Tamoxifen  20 mg daily   09/24/2013 Surgery   Bilateral breast reconstruction with latissmus flap and expander placement   12/12/2013 Surgery   Implant placement    Relapse/Recurrence   Left sided malignant pleural effusion.  Tumor cells are positive for GATA3 and ER, negative for TTF-1 consistent with recurrent breast carcinoma Prognostic showed ER 90% positive strong staining, PR 80% positive strong staining, HER2 negative.    09/16/2021 Imaging   PET scan showed malignant left pleural effusion with extensive areas of nodularity and hypermetabolic activity  involving the mediastinal border and pericardium as well as the most inferior aspects of the costodiaphragmatic recess.  Signs of nodal disease at the thoracic inlet on the left suspect extension of disease below the diaphragm along the left anterolateral aorta.  Nonspecific moderate to markedly increased metabolic activity about the base of the tongue bilaterally.  Consider direct visualization showing mildly asymmetric uptake favoring the right lingual tonsil.  Sclerotic foci without marked increased metabolic activity suspicious for metastatic disease perhaps treated or questions.  Heterogeneous marrow uptake seen elsewhere generalized and nonspecific.  Consider spinal MRI is warranted  MRI brain without any evidence of intracranial metastatic disease.   10/09/2021 - 02/11/2022 Chemotherapy   Taxotere , Herceptin , Perjeta  x 6   12/04/2021 Imaging   There is no evidence new metastatic disease. There is interval decrease in the left pleural effusion possibly suggesting resolving pleural metastatic disease. Few scattered sclerotic metastatic lesions in the skeletal structures have not changed significantly.   No acute findings are seen in the chest abdomen and pelvis.     02/22/2022 -  Antibody Plan   Herceptin /Perjeta  every 3 weeks   03/21/2022 Imaging   CT chest abdomen pelvis  IMPRESSION: 1. No significant change since previous study. Again demonstrated is a chronic small left pleural effusion with basilar atelectasis and scattered sclerotic skeletal metastases. 2. No acute abnormalities are demonstrated. 3. Small esophageal hiatal hernia.     Electronically Signed   By: Sherry Barry M.D.   On: 03/21/2022 20:23   07/22/2023 -  Chemotherapy   Patient is on Treatment Plan : BREAST Fam-Trastuzumab Deruxtecan-nxki  (Enhertu ) (5.4) q21d       CHIEF COMPLIANT: Cycle  6 Enhertu   HISTORY OF PRESENT ILLNESS:   History of Present Illness  Sherry Barry is a 53 year old female  who presents for routine follow-up and treatment.  History of Present Illness  She is here for a follow up visit.  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  She was sent to the hospital for initiation of anticoagulation.  She remained asymptomatic throughout except for some constipation.  With regards to rest of the disease, there appears to be continued response.  She is otherwise tolerating Enhertu  extremely well.  Rest of the pertinent 10 point ROS reviewed and neg.     ALLERGIES:  is allergic to codeine, hydrocodone , latex, lisinopril , and tomato.  MEDICATIONS:  Current Outpatient Medications  Medication Sig Dispense Refill   acetaminophen  (TYLENOL ) 500 MG tablet Take 1,000 mg by mouth every 6 (six) hours as needed for moderate pain.     APIXABAN (ELIQUIS) VTE STARTER PACK (10MG  AND 5MG ) Take as directed on package: start with two-5mg  tablets twice daily for 7 days. On day 8, switch to one-5mg  tablet twice daily. 74 each 0   cholecalciferol (VITAMIN D3) 25 MCG (1000 UNIT) tablet Take 1,000 Units by mouth daily.     dexamethasone  (DECADRON ) 4 MG tablet Take 2 tablets (8 mg) by mouth daily for 3 days starting the day after chemotherapy. Take with food. 30 tablet 1   KLOR-CON  M20 20 MEQ tablet TAKE 1 TABLET BY MOUTH EVERY DAY (Patient taking differently: Take 20 mEq by mouth 2 (two) times daily.) 90 tablet 1   lidocaine -prilocaine  (EMLA ) cream Apply 1 Application topically as needed. 30 g 0   MAGNESIUM PO Take 1 tablet by mouth every evening.     OLANZapine  (ZYPREXA ) 2.5 MG tablet TAKE 1 TABLET BY MOUTH AT BEDTIME. 90 tablet 1   ondansetron  (ZOFRAN ) 8 MG tablet Take 1 tablet (8 mg total) by mouth every 8 (eight) hours as needed for nausea or vomiting. Start on the third day after chemotherapy. 30 tablet 1   polyethylene glycol  powder (GLYCOLAX/MIRALAX) 17 GM/SCOOP powder Take 17 grams dissolved in liquid by mouth daily. 238 g 0   prochlorperazine  (COMPAZINE ) 10 MG tablet Take 1 tablet (10 mg total) by mouth every 6 (six) hours as needed for nausea or vomiting. 30 tablet 1   No current facility-administered medications for this visit.   Facility-Administered Medications Ordered in Other Visits  Medication Dose Route Frequency Provider Last Rate Last Admin   acetaminophen  (TYLENOL ) 325 MG tablet            diphenhydrAMINE  (BENADRYL ) 25 mg capsule             PHYSICAL EXAMINATION: ECOG PERFORMANCE STATUS: 1 - Symptomatic but completely ambulatory  Vitals:   12/15/23 0949  BP: (!) 117/94  Pulse: (!) 107  Resp: 18  Temp: 98.7 F (37.1 C)  SpO2: 100%   Filed Weights   12/15/23 0949  Weight: 152 lb 14.4 oz (69.4 kg)    Physical Exam Constitutional:      Appearance: Normal appearance.  Cardiovascular:     Rate and Rhythm: Normal rate and regular rhythm.     Pulses: Normal pulses.     Heart sounds: Normal heart sounds.  Pulmonary:     Effort: Pulmonary effort is normal.     Breath sounds: Normal breath sounds.  Abdominal:     General: Abdomen is flat.     Palpations: Abdomen is soft.  Musculoskeletal:        General: No swelling or tenderness. Normal range of motion.     Cervical back: Normal range of motion and neck supple. No rigidity.  Lymphadenopathy:     Cervical: No cervical adenopathy.  Skin:    General: Skin is warm and dry.  Neurological:     Mental Status: She is alert.  Psychiatric:        Mood and Affect: Mood normal.      LABORATORY DATA:  I have reviewed the data as listed    Latest Ref Rng & Units 12/12/2023   12:53 AM 12/11/2023   11:05 AM 12/01/2023    1:41 PM  CMP  Glucose 70 - 99 mg/dL 894  879  89   BUN 6 - 20 mg/dL 9  9  18    Creatinine 0.44 - 1.00 mg/dL 9.32  9.24  9.26   Sodium 135 - 145 mmol/L 137  138  142   Potassium 3.5 - 5.1 mmol/L 3.4  3.2  3.7   Chloride  98 - 111 mmol/L 108  107  108   CO2 22 - 32 mmol/L 21  22  27    Calcium 8.9 - 10.3 mg/dL 8.6  8.7  9.2   Total Protein 6.5 - 8.1 g/dL  6.4  6.6   Total Bilirubin 0.0 - 1.2 mg/dL  0.6  0.3   Alkaline Phos 38 - 126 U/L  51  52   AST 15 - 41 U/L  19  20   ALT 0 - 44 U/L  22  22     Lab Results  Component Value Date   WBC 11.1 (H) 12/12/2023   HGB 11.2 (L) 12/12/2023   HCT 33.6 (L) 12/12/2023   MCV 90.1 12/12/2023   PLT 181 12/12/2023   NEUTROABS 8.1 (H) 12/11/2023    ASSESSMENT & PLAN:  Malignant neoplasm of upper-inner quadrant of right breast in female, 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   Current treatment: Enhertu  cycle 5 Enhertu  toxicities: Nausea: Managed with olanzapine  and Compazine  Tachycardia follows with Dr. Rolan  Bone metastases: Zometa  every 12 weeks She will receive Zometa  today. 09/22/2023: CT CAP: Decreased size of the left pleural nodules but increased size of previously known index nodule along the posterior left upper lobe pleura.  Mixed response  Assessment and Plan Assessment & Plan Metastatic breast cancer status post frontline THP and progressed while on Herceptin  and Perjeta  maintenance now on second line Enhertu , she has been tolerating Enhertu  extremely well.  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  She is completely asymptomatic.  She is now on Eliquis for anticoagulation.  I encouraged her to stay on Eliquis indefinitely at this time.  She understands loading and maintenance dose.  Otherwise she appears to be responding to Enhertu .  We have discussed that her it is likely hypercoagulable state from the malignancy, and with possible dehydration and ? APLS that could have led to  clot.  I do not believe this is from Enhertu  and since she has been tolerating it well and she has been responding well to it, I plan to continue Enhertu  and  she actually agreeable to this. We will also proceed with hypercoagulable work up.   No orders of the defined types were placed in this encounter.  The patient has a good understanding of the overall plan. she agrees with it. she will call with any problems that may develop before the next visit here. Total time spent: 30 mins including face to face time and time spent for planning, charting and co-ordination of care   Amber Stalls, MD 12/15/23

## 2023-12-17 LAB — CULTURE, BLOOD (ROUTINE X 2)
Culture: NO GROWTH
Culture: NO GROWTH

## 2023-12-20 ENCOUNTER — Telehealth: Payer: Self-pay | Admitting: *Deleted

## 2023-12-20 NOTE — Telephone Encounter (Signed)
 Per MD request this RN called pt to discuss need for additional labs per draw this week and wanted her to be informed,  Due to recent clotting issue - MD is wanting to evaluate for any clotting disorders.

## 2023-12-21 DIAGNOSIS — I10 Essential (primary) hypertension: Secondary | ICD-10-CM | POA: Diagnosis not present

## 2023-12-21 DIAGNOSIS — I81 Portal vein thrombosis: Secondary | ICD-10-CM | POA: Diagnosis not present

## 2023-12-21 DIAGNOSIS — C50211 Malignant neoplasm of upper-inner quadrant of right female breast: Secondary | ICD-10-CM | POA: Diagnosis not present

## 2023-12-21 DIAGNOSIS — K55069 Acute infarction of intestine, part and extent unspecified: Secondary | ICD-10-CM | POA: Diagnosis not present

## 2023-12-21 MED FILL — Fosaprepitant Dimeglumine For IV Infusion 150 MG (Base Eq): INTRAVENOUS | Qty: 5 | Status: AC

## 2023-12-22 ENCOUNTER — Inpatient Hospital Stay

## 2023-12-22 ENCOUNTER — Inpatient Hospital Stay (HOSPITAL_BASED_OUTPATIENT_CLINIC_OR_DEPARTMENT_OTHER): Admitting: Adult Health

## 2023-12-22 ENCOUNTER — Telehealth: Payer: Self-pay

## 2023-12-22 ENCOUNTER — Encounter: Payer: Self-pay | Admitting: Hematology and Oncology

## 2023-12-22 ENCOUNTER — Encounter: Payer: Self-pay | Admitting: Adult Health

## 2023-12-22 VITALS — BP 134/91 | HR 80 | Temp 98.7°F | Resp 16 | Ht 62.5 in | Wt 156.9 lb

## 2023-12-22 DIAGNOSIS — I81 Portal vein thrombosis: Secondary | ICD-10-CM

## 2023-12-22 DIAGNOSIS — C7951 Secondary malignant neoplasm of bone: Secondary | ICD-10-CM | POA: Diagnosis not present

## 2023-12-22 DIAGNOSIS — Z170 Estrogen receptor positive status [ER+]: Secondary | ICD-10-CM | POA: Diagnosis not present

## 2023-12-22 DIAGNOSIS — Z1732 Human epidermal growth factor receptor 2 negative status: Secondary | ICD-10-CM | POA: Diagnosis not present

## 2023-12-22 DIAGNOSIS — Z1721 Progesterone receptor positive status: Secondary | ICD-10-CM | POA: Diagnosis not present

## 2023-12-22 DIAGNOSIS — C50211 Malignant neoplasm of upper-inner quadrant of right female breast: Secondary | ICD-10-CM

## 2023-12-22 DIAGNOSIS — Z5112 Encounter for antineoplastic immunotherapy: Secondary | ICD-10-CM | POA: Diagnosis not present

## 2023-12-22 DIAGNOSIS — Z90710 Acquired absence of both cervix and uterus: Secondary | ICD-10-CM | POA: Diagnosis not present

## 2023-12-22 LAB — CBC WITH DIFFERENTIAL (CANCER CENTER ONLY)
Abs Immature Granulocytes: 0.04 K/uL (ref 0.00–0.07)
Basophils Absolute: 0.1 K/uL (ref 0.0–0.1)
Basophils Relative: 1 %
Eosinophils Absolute: 0.3 K/uL (ref 0.0–0.5)
Eosinophils Relative: 5 %
HCT: 35.9 % — ABNORMAL LOW (ref 36.0–46.0)
Hemoglobin: 12.2 g/dL (ref 12.0–15.0)
Immature Granulocytes: 1 %
Lymphocytes Relative: 30 %
Lymphs Abs: 1.8 K/uL (ref 0.7–4.0)
MCH: 30.1 pg (ref 26.0–34.0)
MCHC: 34 g/dL (ref 30.0–36.0)
MCV: 88.6 fL (ref 80.0–100.0)
Monocytes Absolute: 0.4 K/uL (ref 0.1–1.0)
Monocytes Relative: 7 %
Neutro Abs: 3.4 K/uL (ref 1.7–7.7)
Neutrophils Relative %: 56 %
Platelet Count: 484 K/uL — ABNORMAL HIGH (ref 150–400)
RBC: 4.05 MIL/uL (ref 3.87–5.11)
RDW: 13.6 % (ref 11.5–15.5)
WBC Count: 6.1 K/uL (ref 4.0–10.5)
nRBC: 0 % (ref 0.0–0.2)

## 2023-12-22 LAB — CMP (CANCER CENTER ONLY)
ALT: 22 U/L (ref 0–44)
AST: 24 U/L (ref 15–41)
Albumin: 3.9 g/dL (ref 3.5–5.0)
Alkaline Phosphatase: 59 U/L (ref 38–126)
Anion gap: 8 (ref 5–15)
BUN: 9 mg/dL (ref 6–20)
CO2: 30 mmol/L (ref 22–32)
Calcium: 9.8 mg/dL (ref 8.9–10.3)
Chloride: 107 mmol/L (ref 98–111)
Creatinine: 0.68 mg/dL (ref 0.44–1.00)
GFR, Estimated: >60 mL/min (ref >60)
Glucose, Bld: 84 mg/dL (ref 70–99)
Potassium: 3.6 mmol/L (ref 3.5–5.1)
Sodium: 145 mmol/L (ref 135–145)
Total Bilirubin: 0.3 mg/dL (ref 0.0–1.2)
Total Protein: 6.9 g/dL (ref 6.5–8.1)

## 2023-12-22 LAB — ANTITHROMBIN III: AntiThromb III Func: 122 % — ABNORMAL HIGH (ref 75–120)

## 2023-12-22 MED ORDER — OLANZAPINE 5 MG PO TABS
5.0000 mg | ORAL_TABLET | Freq: Once | ORAL | Status: AC
  Administered 2023-12-22: 5 mg via ORAL
  Filled 2023-12-22: qty 1

## 2023-12-22 MED ORDER — ACETAMINOPHEN 325 MG PO TABS
650.0000 mg | ORAL_TABLET | Freq: Once | ORAL | Status: AC
Start: 2023-12-22 — End: 2023-12-22
  Administered 2023-12-22: 650 mg via ORAL
  Filled 2023-12-22: qty 2

## 2023-12-22 MED ORDER — HEPARIN SOD (PORK) LOCK FLUSH 100 UNIT/ML IV SOLN
500.0000 [IU] | Freq: Once | INTRAVENOUS | Status: AC | PRN
  Administered 2023-12-22: 500 [IU]

## 2023-12-22 MED ORDER — FAM-TRASTUZUMAB DERUXTECAN-NXKI CHEMO 100 MG IV SOLR
5.4000 mg/kg | Freq: Once | INTRAVENOUS | Status: AC
  Administered 2023-12-22: 340 mg via INTRAVENOUS
  Filled 2023-12-22: qty 17

## 2023-12-22 MED ORDER — PALONOSETRON HCL INJECTION 0.25 MG/5ML
0.2500 mg | Freq: Once | INTRAVENOUS | Status: AC
  Administered 2023-12-22: 0.25 mg via INTRAVENOUS
  Filled 2023-12-22: qty 5

## 2023-12-22 MED ORDER — DIPHENHYDRAMINE HCL 25 MG PO CAPS
25.0000 mg | ORAL_CAPSULE | Freq: Once | ORAL | Status: AC
  Administered 2023-12-22: 25 mg via ORAL
  Filled 2023-12-22: qty 1

## 2023-12-22 MED ORDER — SODIUM CHLORIDE 0.9% FLUSH
10.0000 mL | Freq: Once | INTRAVENOUS | Status: AC
  Administered 2023-12-22: 10 mL

## 2023-12-22 MED ORDER — DEXTROSE 5 % IV SOLN
INTRAVENOUS | Status: DC
Start: 2023-12-22 — End: 2023-12-22

## 2023-12-22 MED ORDER — SODIUM CHLORIDE 0.9% FLUSH
10.0000 mL | INTRAVENOUS | Status: DC | PRN
  Administered 2023-12-22: 10 mL

## 2023-12-22 MED ORDER — FOSAPREPITANT DIMEGLUMINE INJECTION 150 MG
150.0000 mg | Freq: Once | INTRAVENOUS | Status: AC
  Administered 2023-12-22: 150 mg via INTRAVENOUS
  Filled 2023-12-22: qty 150

## 2023-12-22 MED ORDER — DEXAMETHASONE SODIUM PHOSPHATE 10 MG/ML IJ SOLN
10.0000 mg | Freq: Once | INTRAMUSCULAR | Status: AC
  Administered 2023-12-22: 10 mg via INTRAVENOUS
  Filled 2023-12-22: qty 1

## 2023-12-22 NOTE — Patient Instructions (Signed)

## 2023-12-22 NOTE — Progress Notes (Signed)
 Continue Enhertu  340 mg dose today despite wt per Morna Kendall, NP.  Alfonso MARLA Buys, PharmD Pharmacy Resident  12/22/2023 2:51 PM

## 2023-12-22 NOTE — Telephone Encounter (Signed)
 Notified patient of completion of FMLA forms and [employer] forms. Fax transmission confirmations received. Copy of forms placed for pick-up as requested. No other needs or concerns noted at this time.

## 2023-12-22 NOTE — Progress Notes (Signed)
 [facility] Cancer Follow up:    [name], MD [address] [place] [address]   DIAGNOSIS: Cancer Staging  Malignant neoplasm of upper-inner quadrant of right breast in female, estrogen receptor positive (HCC) Staging form: Breast, AJCC 7th Edition - Pathologic stage from 07/14/2012: Stage IV (TX, NX, M1) - Signed by [name], MD on 08/28/2021 Specimen type: Core Needle Biopsy Histopathologic type: 9931 Laterality: Right    SUMMARY OF ONCOLOGIC HISTORY: Oncology History  Malignant neoplasm of upper-inner quadrant of right breast in female, estrogen receptor positive (HCC)  07/13/2012 Clinical Stage   Stage IA: T1c N0   07/14/2012 Definitive Surgery   Bilateral mastectomy/right SLNB: RIGHT invasive ductal carcinoma, grade 3, ER+, PR +, Her2/neu positive (ratio 3.02), Ki67 48%. DCIS. 1/4 LN positive for malignancy. LEFT: benign   07/14/2012 Pathologic Stage   Stage IIB: T2 N1a M0   07/14/2012 Cancer Staging   Staging form: Breast, AJCC 7th Edition - Pathologic stage from 07/14/2012: Stage IV (TX, NX, M1) - Signed by [name], MD on 08/28/2021 Specimen type: Core Needle Biopsy Histopathologic type: 9931 Laterality: Right   08/17/2012 - 08/30/2013 Chemotherapy   Adjuvant carboplatin , docetaxel , and trastuzumab  x 6 cycles (completed 11/30/2012) followed by maintenance trastuzumab  to total one year   11/2012 Procedure   Comp Cancer Gene panel (GeneDx) negative for deleterious mutations    - 02/2013 Radiation Therapy   Adjuvant RT to right breast   02/2013 -  Anti-estrogen oral therapy   Tamoxifen  20 mg daily   09/24/2013 Surgery   Bilateral breast reconstruction with latissmus flap and expander placement   12/12/2013 Surgery   Implant placement    Relapse/Recurrence   Left sided malignant pleural effusion.  Tumor cells are positive for GATA3 and ER, negative for TTF-1 consistent with recurrent breast carcinoma Prognostic showed ER 90%  positive strong staining, PR 80% positive strong staining, HER2 negative.    09/16/2021 Imaging   PET scan showed malignant left pleural effusion with extensive areas of nodularity and hypermetabolic activity involving the mediastinal border and pericardium as well as the most inferior aspects of the costodiaphragmatic recess.  Signs of nodal disease at the thoracic inlet on the left suspect extension of disease below the diaphragm along the left anterolateral aorta.  Nonspecific moderate to markedly increased metabolic activity about the base of the tongue bilaterally.  Consider direct visualization showing mildly asymmetric uptake favoring the right lingual tonsil.  Sclerotic foci without marked increased metabolic activity suspicious for metastatic disease perhaps treated or questions.  Heterogeneous marrow uptake seen elsewhere generalized and nonspecific.  Consider spinal MRI is warranted  MRI brain without any evidence of intracranial metastatic disease.   10/09/2021 - 02/11/2022 Chemotherapy   Taxotere , Herceptin , Perjeta  x 6   12/04/2021 Imaging   There is no evidence new metastatic disease. There is interval decrease in the left pleural effusion possibly suggesting resolving pleural metastatic disease. Few scattered sclerotic metastatic lesions in the skeletal structures have not changed significantly.   No acute findings are seen in the chest abdomen and pelvis.     02/22/2022 -  Antibody Plan   Herceptin /Perjeta  every 3 weeks   03/21/2022 Imaging   CT chest abdomen pelvis  IMPRESSION: 1. No significant change since previous study. Again demonstrated is a chronic small left pleural effusion with basilar atelectasis and scattered sclerotic skeletal metastases. 2. No acute abnormalities are demonstrated. 3. Small esophageal hiatal hernia.     Electronically Signed   By:  [name] M.D.   On: 03/21/2022 20:23   07/22/2023 -  Chemotherapy   Patient is on Treatment Plan :  BREAST Fam-Trastuzumab Deruxtecan-nxki  (Enhertu ) (5.4) q21d       CURRENT THERAPY: Enhertu   INTERVAL HISTORY:  Discussed the use of AI scribe software for clinical note transcription with the patient, who gave verbal consent to proceed.  History of Present Illness Sherry Barry is a 53 year old female with stage four triple positive breast cancer who presents for follow-up and evaluation prior to receiving Enhertu .  She is on HER2-targeted therapy every three weeks. Recent CT scans show changes in the size of left-sided pleural nodules and trace residual loculated left pleural effusion.  In April, a CT scan revealed a new extensive thrombus in the superior mesenteric vein, main portal vein, and intrahepatic portal venous system. She was hospitalized for anticoagulation initiation and has been asymptomatic since. She is currently on Eliquis  twice daily without issues.  She maintains normal activity levels, primarily walking. No abdominal pain, bloating, or changes in stool. She has not experienced any side effects from Enhertu .     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  MEDICAL HISTORY: Past Medical History:  Diagnosis Date   Allergy    Breast cancer (HCC)     Breast cancer (HCC)    right   History of chemotherapy    doxetaxel/carboplatin /trastuzumab    History of migraine    last one about a week ago   Hx of radiation therapy 01/01/13- 02/15/13   r chest wall, r supraclav/axilla 5040 cGy/28 sessions, scar boost 1000 cGy/5 sessions   Hypertension    no meds,   urgent care on pomana   Migraine    Migraine     SURGICAL HISTORY: Past Surgical History:  Procedure Laterality Date   ABDOMINAL HYSTERECTOMY     no salpingo-oophorectomy 2009   BREAST RECONSTRUCTION WITH PLACEMENT OF TISSUE EXPANDER AND FLEX HD (ACELLULAR HYDRATED DERMIS) Left 09/24/2013   IR IMAGING GUIDED PORT INSERTION  10/07/2021   IR THORACENTESIS ASP PLEURAL SPACE W/IMG GUIDE  08/11/2021   IR THORACENTESIS ASP PLEURAL SPACE W/IMG GUIDE  10/07/2021   LATISSIMUS FLAP TO BREAST Right 09/24/2013   Procedure: RIGHT LATISSMUS MYOCUTAEIOUS MUSCLE FLAP AND PLACEMENT OF TISSUE LESLEIGH;  Surgeon: Estefana Reichert, DO;  Location: [facility];  Service: Plastics;  Laterality: Right;   LIPOSUCTION WITH LIPOFILLING Bilateral 12/12/2013   Procedure: LIPOSUCTION WITH LIPOFILLING;  Surgeon: Estefana Reichert, DO;  Location: [facility];  Service: Plastics;  Laterality: Bilateral;   PORT-A-CATH REMOVAL Left 12/12/2013   Procedure: REMOVAL PORT-A-CATH;  Surgeon: Estefana Reichert, DO;  Location: [facility];  Service: Plastics;  Laterality: Left;   PORTACATH PLACEMENT  07/14/2012   Procedure: INSERTION PORT-A-CATH;  Surgeon: Debby LABOR. Cornett, MD;  Location: [facility];  Service: General;  Laterality:  Left;   RECONSTRUCTION BREAST W/ LATISSIMUS DORSI FLAP Right 09/24/2013   & tissue expander placement   REMOVAL OF BILATERAL TISSUE EXPANDERS WITH PLACEMENT OF BILATERAL BREAST IMPLANTS Bilateral 12/12/2013   Procedure: REMOVAL OF BILATERAL TISSUE EXPANDERS WITH PLACEMENT OF BILATERAL BREAST IMPLANTS/BILATERAL CAPSULECTOMIES WITH  LIPOFILLING FAT GRAFTING;  Surgeon: Estefana Reichert, DO;  Location:  [facility];  Service: Plastics;  Laterality: Bilateral;   SIMPLE MASTECTOMY WITH AXILLARY SENTINEL NODE BIOPSY  07/14/2012   Procedure: SIMPLE MASTECTOMY WITH AXILLARY SENTINEL NODE BIOPSY;  Surgeon: Debby LABOR. Cornett, MD;  Location: [facility];  Service: General;  Laterality: Right;  Bilateral simple mastectomy with port and right sebtibel lymph node mapping   SIMPLE MASTECTOMY WITH AXILLARY SENTINEL NODE BIOPSY  07/14/2012   Procedure: SIMPLE MASTECTOMY;  Surgeon: Debby LABOR. Cornett, MD;  Location: [facility];  Service: General;  Laterality: Left;   TISSUE EXPANDER PLACEMENT Left 09/24/2013   Procedure: PLACEMENT OF TISSUE EXPANDER AND FLEX HD TO LEFT BREAST;  Surgeon: Estefana Reichert, DO;  Location: [facility];  Service: Plastics;  Laterality: Left;    SOCIAL HISTORY: Social History   Socioeconomic History   Marital status: Married    Spouse name: Not on file   Number of children: 2   Years of education: Not on file   Highest education level: Not on file  Occupational History    Employer: [employer]  Tobacco Use   Smoking status: Never   Smokeless tobacco: Never  Substance and Sexual Activity   Alcohol use: Yes    Comment: occasional   Drug use: No   Sexual activity: Yes    Birth control/protection: Surgical  Other Topics Concern   Not on file  Social History Narrative   Not on file   Social Drivers of Health   Financial Resource Strain: Not on file  Food Insecurity: No Food Insecurity (12/11/2023)   Hunger Vital Sign    Worried About Running Out of Food in the Last Year: Never true    Ran Out of Food in the Last Year: Never true  Transportation Needs: No Transportation Needs (12/11/2023)   PRAPARE - Administrator, Civil Service (Medical): No    Lack of Transportation (Non-Medical): No  Physical Activity: Not on file  Stress: Not on file  Social Connections: Not on file  Intimate Partner Violence: Not At Risk (12/11/2023)   Humiliation, Afraid, Rape, and  Kick questionnaire    Fear of Current or Ex-Partner: No    Emotionally Abused: No    Physically Abused: No    Sexually Abused: No    FAMILY HISTORY: Family History  Problem Relation Age of Onset   Hypertension Mother    Alcohol abuse Mother    Heart disease Maternal Grandmother    Stroke Maternal Grandfather    Colon cancer Maternal Aunt 55       alive at 4   Brain cancer Maternal Uncle 79       and lymphoma in early 38s; deceased   Brain cancer Maternal Uncle 60       deceased   Pancreatic cancer Maternal Uncle 45       alive at 54   Breast cancer Cousin 17       mat 1st cousin once removed through mat GF ; deceased   Breast cancer Maternal Aunt        great aunt through mat GF; dx at ? age    Review of Systems  Constitutional:  Negative for appetite change, chills, fatigue, fever and unexpected weight change.  HENT:   Negative for hearing loss, lump/mass and trouble swallowing.   Eyes:  Negative for eye problems and icterus.  Respiratory:  Negative for chest tightness, cough and shortness of breath.   Cardiovascular:  Negative for chest pain, leg swelling and palpitations.  Gastrointestinal:  Negative for abdominal distention, abdominal pain, constipation, diarrhea, nausea and vomiting.  Endocrine: Negative for hot flashes.  Genitourinary:  Negative for difficulty urinating.   Musculoskeletal:  Negative for arthralgias.  Skin:  Negative for itching and rash.  Neurological:  Negative for dizziness, extremity weakness, headaches and numbness.  Hematological:  Negative for adenopathy. Does not bruise/bleed easily.  Psychiatric/Behavioral:  Negative for depression. The patient is not nervous/anxious.       PHYSICAL EXAMINATION   Onc Performance Status - 12/22/23 1351       ECOG Perf Status   ECOG Perf Status Fully active, able to carry on all pre-disease performance without restriction      KPS SCALE   KPS % SCORE Able to carry on normal activity, minor s/s of  disease          Vitals:   12/22/23 1340  BP: (!) 134/91  Pulse: 80  Resp: 16  Temp: 98.7 F (37.1 C)  SpO2: 100%    Physical Exam Constitutional:      General: She is not in acute distress.    Appearance: Normal appearance. She is not toxic-appearing.  HENT:     Head: Normocephalic and atraumatic.     Mouth/Throat:     Mouth: Mucous membranes are moist.     Pharynx: Oropharynx is clear. No oropharyngeal exudate or posterior oropharyngeal erythema.  Eyes:     General: No scleral icterus. Cardiovascular:     Rate and Rhythm: Normal rate and regular rhythm.     Pulses: Normal pulses.     Heart sounds: Normal heart sounds.  Pulmonary:     Effort: Pulmonary effort is normal.     Breath sounds: Normal breath sounds.  Abdominal:     General: Abdomen is flat. Bowel sounds are normal. There is no distension.     Palpations: Abdomen is soft.     Tenderness: There is no abdominal tenderness.  Musculoskeletal:        General: No swelling.     Cervical back: Neck supple.  Lymphadenopathy:     Cervical: No cervical adenopathy.  Skin:    General: Skin is warm and dry.     Findings: No rash.  Neurological:     General: No focal deficit present.     Mental Status: She is alert.  Psychiatric:        Mood and Affect: Mood normal.        Behavior: Behavior normal.     LABORATORY DATA:  CBC    Component Value Date/Time   WBC 6.1 12/22/2023 1331   WBC 11.1 (H) 12/12/2023 0353   RBC 4.05 12/22/2023 1331   HGB 12.2 12/22/2023 1331   HGB 15.9 10/30/2019 1030   HGB 14.7 02/15/2017 0852   HCT 35.9 (L) 12/22/2023 1331   HCT 46.1 10/30/2019 1030   HCT 42.9 02/15/2017 0852   PLT 484 (H) 12/22/2023 1331   PLT 245 10/30/2019 1030   MCV 88.6 12/22/2023 1331   MCV 88 10/30/2019 1030   MCV 86.7 02/15/2017 0852   MCH 30.1 12/22/2023 1331   MCHC 34.0 12/22/2023 1331   RDW  13.6 12/22/2023 1331   RDW 12.5 10/30/2019 1030   RDW 12.6 02/15/2017 0852   LYMPHSABS 1.8 12/22/2023  1331   LYMPHSABS 1.4 02/15/2017 0852   MONOABS 0.4 12/22/2023 1331   MONOABS 0.3 02/15/2017 0852   EOSABS 0.3 12/22/2023 1331   EOSABS 0.1 02/15/2017 0852   BASOSABS 0.1 12/22/2023 1331   BASOSABS 0.0 02/15/2017 0852    CMP     Component Value Date/Time   NA 137 12/12/2023 0053   NA 140 10/30/2019 1030   NA 141 02/15/2017 0852   K 3.4 (L) 12/12/2023 0053   K 3.9 02/15/2017 0852   CL 108 12/12/2023 0053   CL 101 11/15/2012 0904   CO2 21 (L) 12/12/2023 0053   CO2 25 02/15/2017 0852   GLUCOSE 105 (H) 12/12/2023 0053   GLUCOSE 87 02/15/2017 0852   GLUCOSE 69 (L) 11/15/2012 0904   BUN 9 12/12/2023 0053   BUN 11 10/30/2019 1030   BUN 12.5 02/15/2017 0852   CREATININE 0.67 12/12/2023 0053   CREATININE 0.73 12/01/2023 1341   CREATININE 0.9 02/15/2017 0852   CALCIUM 8.6 (L) 12/12/2023 0053   CALCIUM 9.3 02/15/2017 0852   PROT 6.4 (L) 12/11/2023 1105   PROT 6.7 10/30/2019 1030   PROT 7.0 02/15/2017 0852   ALBUMIN 3.2 (L) 12/11/2023 1105   ALBUMIN 4.2 10/30/2019 1030   ALBUMIN 3.6 02/15/2017 0852   AST 19 12/11/2023 1105   AST 20 12/01/2023 1341   AST 16 02/15/2017 0852   ALT 22 12/11/2023 1105   ALT 22 12/01/2023 1341   ALT 13 02/15/2017 0852   ALKPHOS 51 12/11/2023 1105   ALKPHOS 63 02/15/2017 0852   BILITOT 0.6 12/11/2023 1105   BILITOT 0.3 12/01/2023 1341   BILITOT 0.69 02/15/2017 0852   GFRNONAA >60 12/12/2023 0053   GFRNONAA >60 12/01/2023 1341   GFRAA 100 10/30/2019 1030     ASSESSMENT and THERAPY PLAN:   Assessment and Plan Assessment & Plan Stage 4 triple positive breast cancer Mixed response to treatment. CT scan showed improved left pleural nodules and trace residual pleural effusion. Sclerotic metastasis unchanged. On Enhertu  with no issues. Kidney function and BUN levels good. - Continue Enhertu  every three weeks. - Monitor kidney and liver function tests.  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 Asymptomatic on Eliquis . Advised to avoid NSAIDs and inform healthcare providers of blood thinner use before procedures. - Continue Eliquis . - Avoid NSAIDs. - Ensure all healthcare providers are aware of blood thinner use before any invasive procedures. - Report any falls or head injuries immediately.  RTC in 3 weeks for labs, f/u with Dr. Loretha, and the next Enhertu .   All questions were answered. The patient knows to call the clinic with any problems, questions or concerns. We can certainly see the patient much sooner if necessary.  Total encounter time:20 minutes*in face-to-face visit time, chart review, lab review, care coordination, order entry, and documentation of the encounter time.    Morna Kendall, NP 12/22/23 2:03 PM Medical Oncology and Hematology [facility] [address] [place], [place] [address] Tel. [phone]    Fax. [phone]  *Total Encounter Time as defined by the Centers for Medicare and Medicaid Services includes, in addition to the face-to-face time of a patient visit (documented in the note above) non-face-to-face time: obtaining and reviewing outside history, ordering and reviewing medications, tests or procedures, care coordination (communications  with other health care professionals or caregivers) and documentation in the medical record.

## 2023-12-23 ENCOUNTER — Other Ambulatory Visit: Payer: Self-pay

## 2023-12-23 LAB — PROTEIN S, TOTAL: Protein S Ag, Total: 115 % (ref 60–150)

## 2023-12-23 LAB — BETA-2-GLYCOPROTEIN I ABS, IGG/M/A
Beta-2 Glyco I IgG: <9 GPI IgG units (ref 0–20)
Beta-2-Glycoprotein I IgA: <9 GPI IgA units (ref 0–25)
Beta-2-Glycoprotein I IgM: <9 GPI IgM units (ref 0–32)

## 2023-12-23 LAB — LUPUS ANTICOAGULANT PANEL
DRVVT: 47.8 s — ABNORMAL HIGH (ref 0.0–47.0)
PTT Lupus Anticoagulant: 31.3 s (ref 0.0–43.5)

## 2023-12-23 LAB — PROTEIN C ACTIVITY: Protein C Activity: 139 % (ref 73–180)

## 2023-12-23 LAB — PROTEIN S ACTIVITY: Protein S Activity: 104 % (ref 63–140)

## 2023-12-23 LAB — DRVVT MIX: dRVVT Mix: 41.9 s — ABNORMAL HIGH (ref 0.0–40.4)

## 2023-12-23 LAB — DRVVT CONFIRM: dRVVT Confirm: 0.8 ratio (ref 0.8–1.2)

## 2023-12-24 LAB — CARDIOLIPIN ANTIBODIES, IGG, IGM, IGA
Anticardiolipin IgA: <9 U/mL (ref 0–11)
Anticardiolipin IgG: <9 GPL U/mL (ref 0–14)
Anticardiolipin IgM: <9 [MPL'U]/mL (ref 0–12)

## 2023-12-24 LAB — PROTEIN C, TOTAL: Protein C, Total: 137 % (ref 60–150)

## 2023-12-25 ENCOUNTER — Encounter: Payer: Self-pay | Admitting: Hematology and Oncology

## 2023-12-26 ENCOUNTER — Encounter: Payer: Self-pay | Admitting: Hematology and Oncology

## 2023-12-27 ENCOUNTER — Other Ambulatory Visit: Payer: Self-pay

## 2023-12-28 LAB — FACTOR 5 LEIDEN

## 2023-12-29 LAB — PROTHROMBIN GENE MUTATION

## 2024-01-05 ENCOUNTER — Other Ambulatory Visit: Payer: Self-pay | Admitting: Hematology and Oncology

## 2024-01-05 DIAGNOSIS — C50211 Malignant neoplasm of upper-inner quadrant of right female breast: Secondary | ICD-10-CM

## 2024-01-06 ENCOUNTER — Other Ambulatory Visit: Payer: Self-pay | Admitting: *Deleted

## 2024-01-06 MED ORDER — POTASSIUM CHLORIDE CRYS ER 20 MEQ PO TBCR: 20.0000 meq | EXTENDED_RELEASE_TABLET | Freq: Two times a day (BID) | ORAL | 3 refills | Status: AC

## 2024-01-09 ENCOUNTER — Other Ambulatory Visit: Payer: Self-pay | Admitting: *Deleted

## 2024-01-09 MED ORDER — APIXABAN 5 MG PO TABS: 5.0000 mg | ORAL_TABLET | Freq: Two times a day (BID) | ORAL | 6 refills | Status: AC

## 2024-01-11 ENCOUNTER — Telehealth: Payer: Self-pay

## 2024-01-11 MED FILL — Fosaprepitant Dimeglumine For IV Infusion 150 MG (Base Eq): INTRAVENOUS | Qty: 5 | Status: AC

## 2024-01-11 NOTE — Telephone Encounter (Signed)
 Pt verbally confirmed appt for 7/31

## 2024-01-12 ENCOUNTER — Inpatient Hospital Stay

## 2024-01-12 ENCOUNTER — Telehealth: Payer: Self-pay | Admitting: *Deleted

## 2024-01-12 ENCOUNTER — Inpatient Hospital Stay: Admitting: Hematology and Oncology

## 2024-01-12 ENCOUNTER — Encounter: Payer: Self-pay | Admitting: Hematology and Oncology

## 2024-01-12 ENCOUNTER — Other Ambulatory Visit: Payer: Self-pay

## 2024-01-12 VITALS — BP 165/92 | HR 87 | Temp 98.4°F | Resp 17 | Wt 161.0 lb

## 2024-01-12 VITALS — BP 152/78

## 2024-01-12 DIAGNOSIS — C50211 Malignant neoplasm of upper-inner quadrant of right female breast: Secondary | ICD-10-CM

## 2024-01-12 DIAGNOSIS — Z170 Estrogen receptor positive status [ER+]: Secondary | ICD-10-CM

## 2024-01-12 DIAGNOSIS — Z1732 Human epidermal growth factor receptor 2 negative status: Secondary | ICD-10-CM | POA: Diagnosis not present

## 2024-01-12 DIAGNOSIS — Z1721 Progesterone receptor positive status: Secondary | ICD-10-CM | POA: Diagnosis not present

## 2024-01-12 DIAGNOSIS — Z5112 Encounter for antineoplastic immunotherapy: Secondary | ICD-10-CM | POA: Diagnosis not present

## 2024-01-12 DIAGNOSIS — Z90710 Acquired absence of both cervix and uterus: Secondary | ICD-10-CM | POA: Diagnosis not present

## 2024-01-12 DIAGNOSIS — C7951 Secondary malignant neoplasm of bone: Secondary | ICD-10-CM | POA: Diagnosis not present

## 2024-01-12 LAB — CBC WITH DIFFERENTIAL (CANCER CENTER ONLY)
Abs Immature Granulocytes: 0.02 K/uL (ref 0.00–0.07)
Basophils Absolute: 0 K/uL (ref 0.0–0.1)
Basophils Relative: 1 %
Eosinophils Absolute: 0.2 K/uL (ref 0.0–0.5)
Eosinophils Relative: 4 %
HCT: 36.2 % (ref 36.0–46.0)
Hemoglobin: 12.3 g/dL (ref 12.0–15.0)
Immature Granulocytes: 0 %
Lymphocytes Relative: 35 %
Lymphs Abs: 1.7 K/uL (ref 0.7–4.0)
MCH: 30 pg (ref 26.0–34.0)
MCHC: 34 g/dL (ref 30.0–36.0)
MCV: 88.3 fL (ref 80.0–100.0)
Monocytes Absolute: 0.5 K/uL (ref 0.1–1.0)
Monocytes Relative: 10 %
Neutro Abs: 2.4 K/uL (ref 1.7–7.7)
Neutrophils Relative %: 50 %
Platelet Count: 323 K/uL (ref 150–400)
RBC: 4.1 MIL/uL (ref 3.87–5.11)
RDW: 14.6 % (ref 11.5–15.5)
WBC Count: 4.9 K/uL (ref 4.0–10.5)
nRBC: 0 % (ref 0.0–0.2)

## 2024-01-12 LAB — CMP (CANCER CENTER ONLY)
ALT: 22 U/L (ref 0–44)
AST: 22 U/L (ref 15–41)
Albumin: 3.7 g/dL (ref 3.5–5.0)
Alkaline Phosphatase: 51 U/L (ref 38–126)
Anion gap: 4 — ABNORMAL LOW (ref 5–15)
BUN: 13 mg/dL (ref 6–20)
CO2: 30 mmol/L (ref 22–32)
Calcium: 8.8 mg/dL — ABNORMAL LOW (ref 8.9–10.3)
Chloride: 108 mmol/L (ref 98–111)
Creatinine: 0.82 mg/dL (ref 0.44–1.00)
GFR, Estimated: >60 mL/min (ref >60)
Glucose, Bld: 93 mg/dL (ref 70–99)
Potassium: 3.4 mmol/L — ABNORMAL LOW (ref 3.5–5.1)
Sodium: 142 mmol/L (ref 135–145)
Total Bilirubin: 0.4 mg/dL (ref 0.0–1.2)
Total Protein: 6.4 g/dL — ABNORMAL LOW (ref 6.5–8.1)

## 2024-01-12 MED ORDER — ZOLEDRONIC ACID 4 MG/100ML IV SOLN
4.0000 mg | Freq: Once | INTRAVENOUS | Status: AC
  Administered 2024-01-12: 4 mg via INTRAVENOUS

## 2024-01-12 MED ORDER — FAM-TRASTUZUMAB DERUXTECAN-NXKI CHEMO 100 MG IV SOLR
5.4000 mg/kg | Freq: Once | INTRAVENOUS | Status: AC
  Administered 2024-01-12: 340 mg via INTRAVENOUS
  Filled 2024-01-12: qty 17

## 2024-01-12 MED ORDER — OLANZAPINE 5 MG PO TABS
5.0000 mg | ORAL_TABLET | Freq: Once | ORAL | Status: AC
  Administered 2024-01-12: 5 mg via ORAL
  Filled 2024-01-12: qty 1

## 2024-01-12 MED ORDER — DEXAMETHASONE SODIUM PHOSPHATE 10 MG/ML IJ SOLN
10.0000 mg | Freq: Once | INTRAMUSCULAR | Status: AC
  Administered 2024-01-12: 10 mg via INTRAVENOUS
  Filled 2024-01-12: qty 1

## 2024-01-12 MED ORDER — FOSAPREPITANT DIMEGLUMINE INJECTION 150 MG
150.0000 mg | Freq: Once | INTRAVENOUS | Status: AC
  Administered 2024-01-12: 150 mg via INTRAVENOUS
  Filled 2024-01-12: qty 150

## 2024-01-12 MED ORDER — SODIUM CHLORIDE 0.9% FLUSH
10.0000 mL | Freq: Once | INTRAVENOUS | Status: AC
Start: 2024-01-12 — End: 2024-01-12
  Administered 2024-01-12: 10 mL

## 2024-01-12 MED ORDER — PALONOSETRON HCL INJECTION 0.25 MG/5ML
0.2500 mg | Freq: Once | INTRAVENOUS | Status: AC
  Administered 2024-01-12: 0.25 mg via INTRAVENOUS
  Filled 2024-01-12: qty 5

## 2024-01-12 MED ORDER — ACETAMINOPHEN 325 MG PO TABS
650.0000 mg | ORAL_TABLET | Freq: Once | ORAL | Status: AC
  Administered 2024-01-12: 650 mg via ORAL
  Filled 2024-01-12: qty 2

## 2024-01-12 MED ORDER — DIPHENHYDRAMINE HCL 25 MG PO CAPS
25.0000 mg | ORAL_CAPSULE | Freq: Once | ORAL | Status: AC
Start: 2024-01-12 — End: 2024-01-12
  Administered 2024-01-12: 25 mg via ORAL
  Filled 2024-01-12: qty 1

## 2024-01-12 NOTE — Progress Notes (Signed)
 [facility] Cancer Follow up:    Sherry Artist PARAS, MD [address] [place] [address]   DIAGNOSIS:  Cancer Staging  Malignant neoplasm of upper-inner quadrant of right breast in female, estrogen receptor positive (HCC) Staging form: Breast, AJCC 7th Edition - Pathologic stage from 07/14/2012: Stage IV (TX, NX, M1) - Signed by [name], MD on 08/28/2021 Specimen type: Core Needle Biopsy Histopathologic type: 9931 Laterality: Right    SUMMARY OF ONCOLOGIC HISTORY: Oncology History  Malignant neoplasm of upper-inner quadrant of right breast in female, estrogen receptor positive (HCC)  07/13/2012 Clinical Stage   Stage IA: T1c N0   07/14/2012 Definitive Surgery   Bilateral mastectomy/right SLNB: RIGHT invasive ductal carcinoma, grade 3, ER+, PR +, Her2/neu positive (ratio 3.02), Ki67 48%. DCIS. 1/4 LN positive for malignancy. LEFT: benign   07/14/2012 Pathologic Stage   Stage IIB: T2 N1a M0   07/14/2012 Cancer Staging   Staging form: Breast, AJCC 7th Edition - Pathologic stage from 07/14/2012: Stage IV (TX, NX, M1) - Signed by [name], MD on 08/28/2021 Specimen type: Core Needle Biopsy Histopathologic type: 9931 Laterality: Right   08/17/2012 - 08/30/2013 Chemotherapy   Adjuvant carboplatin , docetaxel , and trastuzumab  x 6 cycles (completed 11/30/2012) followed by maintenance trastuzumab  to total one year   11/2012 Procedure   Comp Cancer Gene panel (GeneDx) negative for deleterious mutations    - 02/2013 Radiation Therapy   Adjuvant RT to right breast   02/2013 -  Anti-estrogen oral therapy   Tamoxifen  20 mg daily   09/24/2013 Surgery   Bilateral breast reconstruction with latissmus flap and expander placement   12/12/2013 Surgery   Implant placement    Relapse/Recurrence   Left sided malignant pleural effusion.  Tumor cells are positive for GATA3 and ER, negative for TTF-1 consistent with recurrent breast carcinoma Prognostic showed ER 90%  positive strong staining, PR 80% positive strong staining, HER2 negative.    09/16/2021 Imaging   PET scan showed malignant left pleural effusion with extensive areas of nodularity and hypermetabolic activity involving the mediastinal border and pericardium as well as the most inferior aspects of the costodiaphragmatic recess.  Signs of nodal disease at the thoracic inlet on the left suspect extension of disease below the diaphragm along the left anterolateral aorta.  Nonspecific moderate to markedly increased metabolic activity about the base of the tongue bilaterally.  Consider direct visualization showing mildly asymmetric uptake favoring the right lingual tonsil.  Sclerotic foci without marked increased metabolic activity suspicious for metastatic disease perhaps treated or questions.  Heterogeneous marrow uptake seen elsewhere generalized and nonspecific.  Consider spinal MRI is warranted  MRI brain without any evidence of intracranial metastatic disease.   10/09/2021 - 02/11/2022 Chemotherapy   Taxotere , Herceptin , Perjeta  x 6   12/04/2021 Imaging   There is no evidence new metastatic disease. There is interval decrease in the left pleural effusion possibly suggesting resolving pleural metastatic disease. Few scattered sclerotic metastatic lesions in the skeletal structures have not changed significantly.   No acute findings are seen in the chest abdomen and pelvis.     02/22/2022 -  Antibody Plan   Herceptin /Perjeta  every 3 weeks   03/21/2022 Imaging   CT chest abdomen pelvis  IMPRESSION: 1. No significant change since previous study. Again demonstrated is a chronic small left pleural effusion with basilar atelectasis and scattered sclerotic skeletal metastases. 2. No acute abnormalities are demonstrated. 3. Small esophageal hiatal hernia.     Electronically Signed  By: [name] M.D.   On: 03/21/2022 20:23   07/22/2023 -  Chemotherapy   Patient is on Treatment Plan :  BREAST Fam-Trastuzumab Deruxtecan-nxki  (Enhertu ) (5.4) q21d       CURRENT THERAPY: Enhertu   INTERVAL HISTORY:  Discussed the use of AI scribe software for clinical note transcription with the patient, who gave verbal consent to proceed.  History of Present Illness Sherry Barry is a 53 year old female with stage four triple positive breast cancer who presents for follow-up and evaluation prior to receiving Enhertu .  She is on HER2-targeted therapy every three weeks.  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 She was hospitalized for anticoagulation initiation and has been asymptomatic since. She is currently on Eliquis  twice daily without issues.  She recently went to [employer], had fun, didn't get to go on rides. She otherwise has no new bowel changes, hematochezia, chest pain, cough and SOB  Rest of the pertinent 10 point ROS reviewed and neg.    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  MEDICAL HISTORY: Past Medical History:  Diagnosis Date   Allergy    Breast cancer (HCC)    Breast cancer (HCC)    right   History of chemotherapy     doxetaxel/carboplatin /trastuzumab    History of migraine    last one about a week ago   Hx of radiation therapy 01/01/13- 02/15/13   r chest wall, r supraclav/axilla 5040 cGy/28 sessions, scar boost 1000 cGy/5 sessions   Hypertension    no meds,   urgent care on pomana   Migraine    Migraine     SURGICAL HISTORY: Past Surgical History:  Procedure Laterality Date   ABDOMINAL HYSTERECTOMY     no salpingo-oophorectomy 2009   BREAST RECONSTRUCTION WITH PLACEMENT OF TISSUE EXPANDER AND FLEX HD (ACELLULAR HYDRATED DERMIS) Left 09/24/2013   IR IMAGING GUIDED PORT INSERTION  10/07/2021   IR THORACENTESIS ASP PLEURAL SPACE W/IMG GUIDE  08/11/2021   IR THORACENTESIS ASP PLEURAL SPACE W/IMG GUIDE  10/07/2021   LATISSIMUS FLAP TO BREAST Right 09/24/2013   Procedure: RIGHT LATISSMUS MYOCUTAEIOUS MUSCLE FLAP AND PLACEMENT OF TISSUE LESLEIGH;  Surgeon: Estefana Reichert, DO;  Location: [facility];  Service: Plastics;  Laterality: Right;   LIPOSUCTION WITH LIPOFILLING Bilateral 12/12/2013   Procedure: LIPOSUCTION WITH LIPOFILLING;  Surgeon: Estefana Reichert, DO;  Location: [facility];  Service: Plastics;  Laterality: Bilateral;   PORT-A-CATH REMOVAL Left 12/12/2013   Procedure: REMOVAL PORT-A-CATH;  Surgeon: Estefana Reichert, DO;  Location: [facility];  Service: Plastics;  Laterality: Left;   PORTACATH PLACEMENT  07/14/2012   Procedure: INSERTION PORT-A-CATH;  Surgeon: Debby LABOR. Cornett, MD;  Location: [facility];  Service: General;  Laterality: Left;   RECONSTRUCTION BREAST  W/ LATISSIMUS DORSI FLAP Right 09/24/2013   & tissue expander placement   REMOVAL OF BILATERAL TISSUE EXPANDERS WITH PLACEMENT OF BILATERAL BREAST IMPLANTS Bilateral 12/12/2013   Procedure: REMOVAL OF BILATERAL TISSUE EXPANDERS WITH PLACEMENT OF BILATERAL BREAST IMPLANTS/BILATERAL CAPSULECTOMIES WITH  LIPOFILLING FAT GRAFTING;  Surgeon: Estefana Reichert, DO;  Location: [facility];  Service: Plastics;  Laterality:  Bilateral;   SIMPLE MASTECTOMY WITH AXILLARY SENTINEL NODE BIOPSY  07/14/2012   Procedure: SIMPLE MASTECTOMY WITH AXILLARY SENTINEL NODE BIOPSY;  Surgeon: Debby LABOR. Cornett, MD;  Location: [facility];  Service: General;  Laterality: Right;  Bilateral simple mastectomy with port and right sebtibel lymph node mapping   SIMPLE MASTECTOMY WITH AXILLARY SENTINEL NODE BIOPSY  07/14/2012   Procedure: SIMPLE MASTECTOMY;  Surgeon: Debby LABOR. Cornett, MD;  Location: [facility];  Service: General;  Laterality: Left;   TISSUE EXPANDER PLACEMENT Left 09/24/2013   Procedure: PLACEMENT OF TISSUE EXPANDER AND FLEX HD TO LEFT BREAST;  Surgeon: Estefana Reichert, DO;  Location: [facility];  Service: Plastics;  Laterality: Left;    SOCIAL HISTORY: Social History   Socioeconomic History   Marital status: Married    Spouse name: Not on file   Number of children: 2   Years of education: Not on file   Highest education level: Not on file  Occupational History    Employer: [employer]  Tobacco Use   Smoking status: Never   Smokeless tobacco: Never  Substance and Sexual Activity   Alcohol use: Yes    Comment: occasional   Drug use: No   Sexual activity: Yes    Birth control/protection: Surgical  Other Topics Concern   Not on file  Social History Narrative   Not on file   Social Drivers of Health   Financial Resource Strain: Not on file  Food Insecurity: No Food Insecurity (12/11/2023)   Hunger Vital Sign    Worried About Running Out of Food in the Last Year: Never true    Ran Out of Food in the Last Year: Never true  Transportation Needs: No Transportation Needs (12/11/2023)   PRAPARE - Administrator, Civil Service (Medical): No    Lack of Transportation (Non-Medical): No  Physical Activity: Not on file  Stress: Not on file  Social Connections: Not on file  Intimate Partner Violence: Not At Risk (12/11/2023)   Humiliation, Afraid, Rape, and Kick questionnaire    Fear of Current or Ex-Partner: No     Emotionally Abused: No    Physically Abused: No    Sexually Abused: No    FAMILY HISTORY: Family History  Problem Relation Age of Onset   Hypertension Mother    Alcohol abuse Mother    Heart disease Maternal Grandmother    Stroke Maternal Grandfather    Colon cancer Maternal Aunt 55       alive at 40   Brain cancer Maternal Uncle 6       and lymphoma in early 95s; deceased   Brain cancer Maternal Uncle 60       deceased   Pancreatic cancer Maternal Uncle 45       alive at 48   Breast cancer Cousin 63       mat 1st cousin once removed through mat GF ; deceased   Breast cancer Maternal Aunt        great aunt through mat GF; dx at ? age    Review of Systems  Constitutional:  Negative for appetite  change, chills, fatigue, fever and unexpected weight change.  HENT:   Negative for hearing loss, lump/mass and trouble swallowing.   Eyes:  Negative for eye problems and icterus.  Respiratory:  Negative for chest tightness, cough and shortness of breath.   Cardiovascular:  Negative for chest pain, leg swelling and palpitations.  Gastrointestinal:  Negative for abdominal distention, abdominal pain, constipation, diarrhea, nausea and vomiting.  Endocrine: Negative for hot flashes.  Genitourinary:  Negative for difficulty urinating.   Musculoskeletal:  Negative for arthralgias.  Skin:  Negative for itching and rash.  Neurological:  Negative for dizziness, extremity weakness, headaches and numbness.  Hematological:  Negative for adenopathy. Does not bruise/bleed easily.  Psychiatric/Behavioral:  Negative for depression. The patient is not nervous/anxious.       PHYSICAL EXAMINATION     Vitals:   01/12/24 1443 01/12/24 1444  BP: (!) 166/104 (!) 165/92  Pulse: 87   Resp: 17   Temp: 98.4 F (36.9 C)   SpO2: 100%     Physical Exam Constitutional:      General: She is not in acute distress.    Appearance: Normal appearance. She is not toxic-appearing.  HENT:     Head:  Normocephalic and atraumatic.     Mouth/Throat:     Mouth: Mucous membranes are moist.     Pharynx: Oropharynx is clear. No oropharyngeal exudate or posterior oropharyngeal erythema.  Eyes:     General: No scleral icterus. Cardiovascular:     Rate and Rhythm: Normal rate and regular rhythm.     Pulses: Normal pulses.     Heart sounds: Normal heart sounds.  Pulmonary:     Effort: Pulmonary effort is normal.     Breath sounds: Normal breath sounds.  Abdominal:     General: Abdomen is flat. Bowel sounds are normal. There is no distension.     Palpations: Abdomen is soft.     Tenderness: There is no abdominal tenderness.  Musculoskeletal:        General: No swelling.     Cervical back: Neck supple.  Lymphadenopathy:     Cervical: No cervical adenopathy.  Skin:    General: Skin is warm and dry.     Findings: No rash.  Neurological:     General: No focal deficit present.     Mental Status: She is alert.  Psychiatric:        Mood and Affect: Mood normal.        Behavior: Behavior normal.     LABORATORY DATA:  CBC    Component Value Date/Time   WBC 4.9 01/12/2024 1428   WBC 11.1 (H) 12/12/2023 0353   RBC 4.10 01/12/2024 1428   HGB 12.3 01/12/2024 1428   HGB 15.9 10/30/2019 1030   HGB 14.7 02/15/2017 0852   HCT 36.2 01/12/2024 1428   HCT 46.1 10/30/2019 1030   HCT 42.9 02/15/2017 0852   PLT 323 01/12/2024 1428   PLT 245 10/30/2019 1030   MCV 88.3 01/12/2024 1428   MCV 88 10/30/2019 1030   MCV 86.7 02/15/2017 0852   MCH 30.0 01/12/2024 1428   MCHC 34.0 01/12/2024 1428   RDW 14.6 01/12/2024 1428   RDW 12.5 10/30/2019 1030   RDW 12.6 02/15/2017 0852   LYMPHSABS 1.7 01/12/2024 1428   LYMPHSABS 1.4 02/15/2017 0852   MONOABS 0.5 01/12/2024 1428   MONOABS 0.3 02/15/2017 0852   EOSABS 0.2 01/12/2024 1428   EOSABS 0.1 02/15/2017 0852   BASOSABS 0.0 01/12/2024 1428  BASOSABS 0.0 02/15/2017 0852    CMP     Component Value Date/Time   NA 145 12/22/2023 1331   NA  140 10/30/2019 1030   NA 141 02/15/2017 0852   K 3.6 12/22/2023 1331   K 3.9 02/15/2017 0852   CL 107 12/22/2023 1331   CL 101 11/15/2012 0904   CO2 30 12/22/2023 1331   CO2 25 02/15/2017 0852   GLUCOSE 84 12/22/2023 1331   GLUCOSE 87 02/15/2017 0852   GLUCOSE 69 (L) 11/15/2012 0904   BUN 9 12/22/2023 1331   BUN 11 10/30/2019 1030   BUN 12.5 02/15/2017 0852   CREATININE 0.68 12/22/2023 1331   CREATININE 0.9 02/15/2017 0852   CALCIUM 9.8 12/22/2023 1331   CALCIUM 9.3 02/15/2017 0852   PROT 6.9 12/22/2023 1331   PROT 6.7 10/30/2019 1030   PROT 7.0 02/15/2017 0852   ALBUMIN 3.9 12/22/2023 1331   ALBUMIN 4.2 10/30/2019 1030   ALBUMIN 3.6 02/15/2017 0852   AST 24 12/22/2023 1331   AST 16 02/15/2017 0852   ALT 22 12/22/2023 1331   ALT 13 02/15/2017 0852   ALKPHOS 59 12/22/2023 1331   ALKPHOS 63 02/15/2017 0852   BILITOT 0.3 12/22/2023 1331   BILITOT 0.69 02/15/2017 0852   GFRNONAA >60 12/22/2023 1331   GFRAA 100 10/30/2019 1030     ASSESSMENT and THERAPY PLAN:   Assessment and Plan Assessment & Plan Stage 4 triple positive breast cancer CT scan with ongoing response. She is tolerating enhertu  very well. - Continue Enhertu  every three weeks. - ECHO ordered by cardio oncology,  - Labs from today pending.  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 - Continue Eliquis .  RTC in 3 weeks for labs, f/u  All questions were answered. The patient knows to call the clinic with any problems, questions or concerns. We can certainly see the patient much sooner if necessary.  Total encounter time:20 minutes*in face-to-face visit time, chart review, lab review, care coordination, order entry, and documentation of the encounter time.  *Total Encounter Time as defined by the Centers for Medicare and Medicaid Services includes, in addition to the face-to-face time of a  patient visit (documented in the note above) non-face-to-face time: obtaining and reviewing outside history, ordering and reviewing medications, tests or procedures, care coordination (communications with other health care professionals or caregivers) and documentation in the medical record.

## 2024-01-12 NOTE — Progress Notes (Signed)
 Patient Care Team: [name], MD as PCP - General (Family Medicine) [name], MD as Consulting Physician (Obstetrics and Gynecology) [name], MD as Consulting Physician (Hematology and Oncology)  DIAGNOSIS:  No diagnosis found.   SUMMARY OF ONCOLOGIC HISTORY: Oncology History  Malignant neoplasm of upper-inner quadrant of right breast in female, estrogen receptor positive (HCC)  07/13/2012 Clinical Stage   Stage IA: T1c N0   07/14/2012 Definitive Surgery   Bilateral mastectomy/right SLNB: RIGHT invasive ductal carcinoma, grade 3, ER+, PR +, Her2/neu positive (ratio 3.02), Ki67 48%. DCIS. 1/4 LN positive for malignancy. LEFT: benign   07/14/2012 Pathologic Stage   Stage IIB: T2 N1a M0   07/14/2012 Cancer Staging   Staging form: Breast, AJCC 7th Edition - Pathologic stage from 07/14/2012: Stage IV (TX, NX, M1) - Signed by [name], MD on 08/28/2021 Specimen type: Core Needle Biopsy Histopathologic type: 9931 Laterality: Right   08/17/2012 - 08/30/2013 Chemotherapy   Adjuvant carboplatin , docetaxel , and trastuzumab  x 6 cycles (completed 11/30/2012) followed by maintenance trastuzumab  to total one year   11/2012 Procedure   Comp Cancer Gene panel (Gene[facility]) negative for deleterious mutations    - 02/2013 Radiation Therapy   Adjuvant RT to right breast   02/2013 -  Anti-estrogen oral therapy   Tamoxifen  20 mg daily   09/24/2013 Surgery   Bilateral breast reconstruction with latissmus flap and expander placement   12/12/2013 Surgery   Implant placement    Relapse/Recurrence   Left sided malignant pleural effusion.  Tumor cells are positive for GATA3 and ER, negative for TTF-1 consistent with recurrent breast carcinoma Prognostic showed ER 90% positive strong staining, PR 80% positive strong staining, HER2 negative.    09/16/2021 Imaging   PET scan showed malignant left pleural effusion with extensive areas of nodularity and hypermetabolic activity  involving the mediastinal border and pericardium as well as the most inferior aspects of the costodiaphragmatic recess.  Signs of nodal disease at the thoracic inlet on the left suspect extension of disease below the diaphragm along the left anterolateral aorta.  Nonspecific moderate to markedly increased metabolic activity about the base of the tongue bilaterally.  Consider direct visualization showing mildly asymmetric uptake favoring the right lingual tonsil.  Sclerotic foci without marked increased metabolic activity suspicious for metastatic disease perhaps treated or questions.  Heterogeneous marrow uptake seen elsewhere generalized and nonspecific.  Consider spinal MRI is warranted  MRI brain without any evidence of intracranial metastatic disease.   10/09/2021 - 02/11/2022 Chemotherapy   Taxotere , Herceptin , Perjeta  x 6   12/04/2021 Imaging   There is no evidence new metastatic disease. There is interval decrease in the left pleural effusion possibly suggesting resolving pleural metastatic disease. Few scattered sclerotic metastatic lesions in the skeletal structures have not changed significantly.   No acute findings are seen in the chest abdomen and pelvis.     02/22/2022 -  Antibody Plan   Herceptin /Perjeta  every 3 weeks   03/21/2022 Imaging   CT chest abdomen pelvis  IMPRESSION: 1. No significant change since previous study. Again demonstrated is a chronic small left pleural effusion with basilar atelectasis and scattered sclerotic skeletal metastases. 2. No acute abnormalities are demonstrated. 3. Small esophageal hiatal hernia.     Electronically Signed   By: [name] M.D.   On: 03/21/2022 20:23   07/22/2023 -  Chemotherapy   Patient is on Treatment Plan : BREAST Fam-Trastuzumab Deruxtecan-nxki  (Enhertu ) (5.4) q21d       CHIEF COMPLIANT: Cycle  6 Enhertu   HISTORY OF PRESENT ILLNESS:   History of Present Illness  Sherry Barry is a 53 year old female  who presents for routine follow-up and treatment.  History of Present Illness  She is here for a follow up visit.  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  She was sent to the hospital for initiation of anticoagulation.  She remained asymptomatic throughout except for some constipation.  With regards to rest of the disease, there appears to be continued response.  She is otherwise tolerating Enhertu  extremely well.  Rest of the pertinent 10 point ROS reviewed and neg.     ALLERGIES:  is allergic to codeine, hydrocodone , latex, lisinopril , and tomato.  MEDICATIONS:  Current Outpatient Medications  Medication Sig Dispense Refill   acetaminophen  (TYLENOL ) 500 MG tablet Take 1,000 mg by mouth every 6 (six) hours as needed for moderate pain.     apixaban  (ELIQUIS ) 5 MG TABS tablet Take 1 tablet (5 mg total) by mouth 2 (two) times daily. 60 tablet 6   cholecalciferol  (VITAMIN D3) 25 MCG (1000 UNIT) tablet Take 1,000 Units by mouth daily.     dexamethasone  (DECADRON ) 4 MG tablet Take 2 tablets (8 mg) by mouth daily for 3 days starting the day after chemotherapy. Take with food. 30 tablet 1   lidocaine -prilocaine  (EMLA ) cream Apply 1 Application topically as needed. 30 g 0   MAGNESIUM PO Take 1 tablet by mouth every evening.     OLANZapine  (ZYPREXA ) 2.5 MG tablet TAKE 1 TABLET BY MOUTH AT BEDTIME. 90 tablet 1   ondansetron  (ZOFRAN ) 8 MG tablet Take 1 tablet (8 mg total) by mouth every 8 (eight) hours as needed for nausea or vomiting. Start on the third day after chemotherapy. 30 tablet 1   polyethylene glycol powder (GLYCOLAX /MIRALAX ) 17 GM/SCOOP powder Take 17 grams dissolved in liquid by mouth daily. 238 g 0   potassium chloride  SA (KLOR-CON  M20) 20 MEQ tablet Take 1 tablet (20 mEq total) by mouth 2 (two) times daily. 60 tablet 3    prochlorperazine  (COMPAZINE ) 10 MG tablet Take 1 tablet (10 mg total) by mouth every 6 (six) hours as needed for nausea or vomiting. 30 tablet 1   No current facility-administered medications for this visit.   Facility-Administered Medications Ordered in Other Visits  Medication Dose Route Frequency Provider Last Rate Last Admin   acetaminophen  (TYLENOL ) 325 MG tablet            diphenhydrAMINE  (BENADRYL ) 25 mg capsule             PHYSICAL EXAMINATION: ECOG PERFORMANCE STATUS: 1 - Symptomatic but completely ambulatory  Vitals:   01/12/24 1443 01/12/24 1444  BP: (!) 166/104 (!) 165/92  Pulse: 87   Resp: 17   Temp: 98.4 F (36.9 C)   SpO2: 100%    Filed Weights   01/12/24 1443  Weight: 161 lb (73 kg)    Physical Exam Constitutional:      Appearance: Normal appearance.  Cardiovascular:     Rate and Rhythm: Normal rate and regular rhythm.     Pulses: Normal pulses.     Heart sounds: Normal heart sounds.  Pulmonary:     Effort: Pulmonary effort is normal.     Breath sounds: Normal breath sounds.  Abdominal:     General: Abdomen is flat.  Palpations: Abdomen is soft.  Musculoskeletal:        General: No swelling or tenderness. Normal range of motion.     Cervical back: Normal range of motion and neck supple. No rigidity.  Lymphadenopathy:     Cervical: No cervical adenopathy.  Skin:    General: Skin is warm and dry.  Neurological:     Mental Status: She is alert.  Psychiatric:        Mood and Affect: Mood normal.      LABORATORY DATA:  I have reviewed the data as listed    Latest Ref Rng & Units 12/22/2023    1:31 PM 12/12/2023   12:53 AM 12/11/2023   11:05 AM  CMP  Glucose 70 - 99 mg/dL 84  894  879   BUN 6 - 20 mg/dL 9  9  9    Creatinine 0.44 - 1.00 mg/dL 9.31  9.32  9.24   Sodium 135 - 145 mmol/L 145  137  138   Potassium 3.5 - 5.1 mmol/L 3.6  3.4  3.2   Chloride 98 - 111 mmol/L 107  108  107   CO2 22 - 32 mmol/L 30  21  22    Calcium 8.9 - 10.3 mg/dL  9.8  8.6  8.7   Total Protein 6.5 - 8.1 g/dL 6.9   6.4   Total Bilirubin 0.0 - 1.2 mg/dL 0.3   0.6   Alkaline Phos 38 - 126 U/L 59   51   AST 15 - 41 U/L 24   19   ALT 0 - 44 U/L 22   22     Lab Results  Component Value Date   WBC 6.1 12/22/2023   HGB 12.2 12/22/2023   HCT 35.9 (L) 12/22/2023   MCV 88.6 12/22/2023   PLT 484 (H) 12/22/2023   NEUTROABS 3.4 12/22/2023    ASSESSMENT & PLAN:  Malignant neoplasm of upper-inner quadrant of right breast in female, 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   Current treatment: Enhertu  cycle 5 Enhertu  toxicities: Nausea: Managed with olanzapine  and Compazine  Tachycardia follows with Dr. Rolan  Bone metastases: Zometa  every 12 weeks She will receive Zometa  today. 09/22/2023: CT CAP: Decreased size of the left pleural nodules but increased size of previously known index nodule along the posterior left upper lobe pleura.  Mixed response  Assessment and Plan Assessment & Plan Metastatic breast cancer status post frontline THP and progressed while on Herceptin  and Perjeta  maintenance now on second line Enhertu , she has been tolerating Enhertu  extremely well.  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  She is completely asymptomatic.  She is now on Eliquis  for anticoagulation.  I encouraged her to stay on Eliquis  indefinitely at this time.  She understands loading and maintenance dose.  Otherwise she appears to be responding to Enhertu .  We have discussed that her it is likely hypercoagulable state from the malignancy, and with possible dehydration and ? APLS that could have led to clot.  I do not believe this is from Enhertu  and since she has been tolerating it well and she has  been responding well to it, I plan to continue Enhertu  and she actually agreeable to this. We will also proceed with hypercoagulable work up.  No orders of the defined types were placed in this encounter.  The patient has a good understanding of the overall plan. she agrees with it. she will call with any problems that may develop before the next visit here. Total time spent: 30 mins including face to face time and time spent for planning, charting and co-ordination of care   Amber Stalls, MD 01/12/24

## 2024-01-12 NOTE — Patient Instructions (Signed)
 [facility] - A DEPT OF [facility]  Discharge Instructions: Thank you for choosing [facility] to provide your oncology and hematology care.   If you have a lab appointment with the Cancer Center, please go directly to the Cancer Center and check in at the registration area.   Wear comfortable clothing and clothing appropriate for easy access to any Portacath or PICC line.   We strive to give you quality time with your provider. You may need to reschedule your appointment if you arrive late (15 or more minutes).  Arriving late affects you and other patients whose appointments are after yours.  Also, if you miss three or more appointments without notifying the office, you may be dismissed from the clinic at the provider's discretion.      For prescription refill requests, have your pharmacy contact our office and allow 72 hours for refills to be completed.    Today you received the following chemotherapy and/or immunotherapy agents: Enhertu , Zometa       To help prevent nausea and vomiting after your treatment, we encourage you to take your nausea medication as directed.  BELOW ARE SYMPTOMS THAT SHOULD BE REPORTED IMMEDIATELY: *FEVER GREATER THAN 100.4 F (38 C) OR HIGHER *CHILLS OR SWEATING *NAUSEA AND VOMITING THAT IS NOT CONTROLLED WITH YOUR NAUSEA MEDICATION *UNUSUAL SHORTNESS OF BREATH *UNUSUAL BRUISING OR BLEEDING *URINARY PROBLEMS (pain or burning when urinating, or frequent urination) *BOWEL PROBLEMS (unusual diarrhea, constipation, pain near the anus) TENDERNESS IN MOUTH AND THROAT WITH OR WITHOUT PRESENCE OF ULCERS (sore throat, sores in mouth, or a toothache) UNUSUAL RASH, SWELLING OR PAIN  UNUSUAL VAGINAL DISCHARGE OR ITCHING   Items with * indicate a potential emergency and should be followed up as soon as possible or go to the Emergency Department if any problems should occur.  Please show the CHEMOTHERAPY ALERT CARD or  IMMUNOTHERAPY ALERT CARD at check-in to the Emergency Department and triage nurse.  Should you have questions after your visit or need to cancel or reschedule your appointment, please contact [facility] - A DEPT OF [facility]  Dept: [phone]  and follow the prompts.  Office hours are 8:00 a.m. to 4:30 p.m. Monday - Friday. Please note that voicemails left after 4:00 p.m. may not be returned until the following business day.  We are closed weekends and major holidays. You have access to a nurse at all times for urgent questions. Please call the main number to the clinic Dept: [phone] and follow the prompts.   For any non-urgent questions, you may also contact your provider using MyChart. We now offer e-Visits for anyone 52 and older to request care online for non-urgent symptoms. For details visit [email or web address].   Also download the MyChart app! Go to the app store, search MyChart, open the app, select [place], and log in with your MyChart username and password.

## 2024-01-12 NOTE — Telephone Encounter (Addendum)
 Noted change in weight and need for clarification of dosing of Enhertu .  Per MD review keep at prior dose of 340mg .  Above information given to pharmacy.

## 2024-01-19 ENCOUNTER — Other Ambulatory Visit: Payer: Self-pay

## 2024-02-01 MED FILL — Fosaprepitant Dimeglumine For IV Infusion 150 MG (Base Eq): INTRAVENOUS | Qty: 5 | Status: AC

## 2024-02-02 ENCOUNTER — Encounter: Payer: Self-pay | Admitting: Hematology and Oncology

## 2024-02-02 ENCOUNTER — Inpatient Hospital Stay: Admitting: Hematology and Oncology

## 2024-02-02 ENCOUNTER — Inpatient Hospital Stay

## 2024-02-02 ENCOUNTER — Inpatient Hospital Stay: Attending: Hematology and Oncology

## 2024-02-02 VITALS — BP 138/99 | HR 102 | Temp 98.1°F | Resp 18 | Wt 160.1 lb

## 2024-02-02 DIAGNOSIS — Z1732 Human epidermal growth factor receptor 2 negative status: Secondary | ICD-10-CM | POA: Diagnosis not present

## 2024-02-02 DIAGNOSIS — Z86718 Personal history of other venous thrombosis and embolism: Secondary | ICD-10-CM | POA: Diagnosis not present

## 2024-02-02 DIAGNOSIS — Z1721 Progesterone receptor positive status: Secondary | ICD-10-CM | POA: Insufficient documentation

## 2024-02-02 DIAGNOSIS — Z5112 Encounter for antineoplastic immunotherapy: Secondary | ICD-10-CM | POA: Insufficient documentation

## 2024-02-02 DIAGNOSIS — Z7901 Long term (current) use of anticoagulants: Secondary | ICD-10-CM | POA: Insufficient documentation

## 2024-02-02 DIAGNOSIS — Z170 Estrogen receptor positive status [ER+]: Secondary | ICD-10-CM | POA: Diagnosis not present

## 2024-02-02 DIAGNOSIS — C7951 Secondary malignant neoplasm of bone: Secondary | ICD-10-CM | POA: Diagnosis not present

## 2024-02-02 DIAGNOSIS — C50211 Malignant neoplasm of upper-inner quadrant of right female breast: Secondary | ICD-10-CM | POA: Diagnosis not present

## 2024-02-02 DIAGNOSIS — Z9221 Personal history of antineoplastic chemotherapy: Secondary | ICD-10-CM | POA: Insufficient documentation

## 2024-02-02 DIAGNOSIS — Z9013 Acquired absence of bilateral breasts and nipples: Secondary | ICD-10-CM | POA: Diagnosis not present

## 2024-02-02 DIAGNOSIS — I4891 Unspecified atrial fibrillation: Secondary | ICD-10-CM | POA: Diagnosis not present

## 2024-02-02 LAB — CBC WITH DIFFERENTIAL (CANCER CENTER ONLY)
Abs Immature Granulocytes: 0.02 K/uL (ref 0.00–0.07)
Basophils Absolute: 0 K/uL (ref 0.0–0.1)
Basophils Relative: 1 %
Eosinophils Absolute: 0.2 K/uL (ref 0.0–0.5)
Eosinophils Relative: 4 %
HCT: 38.1 % (ref 36.0–46.0)
Hemoglobin: 13.2 g/dL (ref 12.0–15.0)
Immature Granulocytes: 0 %
Lymphocytes Relative: 26 %
Lymphs Abs: 1.4 K/uL (ref 0.7–4.0)
MCH: 30.2 pg (ref 26.0–34.0)
MCHC: 34.6 g/dL (ref 30.0–36.0)
MCV: 87.2 fL (ref 80.0–100.0)
Monocytes Absolute: 0.6 K/uL (ref 0.1–1.0)
Monocytes Relative: 11 %
Neutro Abs: 3.1 K/uL (ref 1.7–7.7)
Neutrophils Relative %: 58 %
Platelet Count: 324 K/uL (ref 150–400)
RBC: 4.37 MIL/uL (ref 3.87–5.11)
RDW: 14.5 % (ref 11.5–15.5)
WBC Count: 5.3 K/uL (ref 4.0–10.5)
nRBC: 0 % (ref 0.0–0.2)

## 2024-02-02 LAB — CMP (CANCER CENTER ONLY)
ALT: 18 U/L (ref 0–44)
AST: 20 U/L (ref 15–41)
Albumin: 4 g/dL (ref 3.5–5.0)
Alkaline Phosphatase: 55 U/L (ref 38–126)
Anion gap: 6 (ref 5–15)
BUN: 12 mg/dL (ref 6–20)
CO2: 27 mmol/L (ref 22–32)
Calcium: 9 mg/dL (ref 8.9–10.3)
Chloride: 109 mmol/L (ref 98–111)
Creatinine: 0.63 mg/dL (ref 0.44–1.00)
GFR, Estimated: >60 mL/min (ref >60)
Glucose, Bld: 119 mg/dL — ABNORMAL HIGH (ref 70–99)
Potassium: 3.7 mmol/L (ref 3.5–5.1)
Sodium: 142 mmol/L (ref 135–145)
Total Bilirubin: 0.3 mg/dL (ref 0.0–1.2)
Total Protein: 6.5 g/dL (ref 6.5–8.1)

## 2024-02-02 MED ORDER — FOSAPREPITANT DIMEGLUMINE INJECTION 150 MG
150.0000 mg | Freq: Once | INTRAVENOUS | Status: AC
  Administered 2024-02-02: 150 mg via INTRAVENOUS
  Filled 2024-02-02: qty 150

## 2024-02-02 MED ORDER — PALONOSETRON HCL INJECTION 0.25 MG/5ML
0.2500 mg | Freq: Once | INTRAVENOUS | Status: AC
  Administered 2024-02-02: 0.25 mg via INTRAVENOUS
  Filled 2024-02-02: qty 5

## 2024-02-02 MED ORDER — DIPHENHYDRAMINE HCL 25 MG PO CAPS
25.0000 mg | ORAL_CAPSULE | Freq: Once | ORAL | Status: AC
  Administered 2024-02-02: 25 mg via ORAL
  Filled 2024-02-02: qty 1

## 2024-02-02 MED ORDER — ACETAMINOPHEN 325 MG PO TABS
650.0000 mg | ORAL_TABLET | Freq: Once | ORAL | Status: AC
  Administered 2024-02-02: 650 mg via ORAL
  Filled 2024-02-02: qty 2

## 2024-02-02 MED ORDER — OLANZAPINE 5 MG PO TABS
5.0000 mg | ORAL_TABLET | Freq: Once | ORAL | Status: AC
  Administered 2024-02-02: 5 mg via ORAL
  Filled 2024-02-02: qty 1

## 2024-02-02 MED ORDER — SODIUM CHLORIDE 0.9% FLUSH
10.0000 mL | Freq: Once | INTRAVENOUS | Status: AC | PRN
  Administered 2024-02-02: 10 mL

## 2024-02-02 MED ORDER — DEXAMETHASONE SODIUM PHOSPHATE 10 MG/ML IJ SOLN
10.0000 mg | Freq: Once | INTRAMUSCULAR | Status: AC
  Administered 2024-02-02: 10 mg via INTRAVENOUS
  Filled 2024-02-02: qty 1

## 2024-02-02 MED ORDER — DEXTROSE 5 % IV SOLN: INTRAVENOUS | Status: DC

## 2024-02-02 MED ORDER — FAM-TRASTUZUMAB DERUXTECAN-NXKI CHEMO 100 MG IV SOLR
5.4000 mg/kg | Freq: Once | INTRAVENOUS | Status: AC
  Administered 2024-02-02: 340 mg via INTRAVENOUS
  Filled 2024-02-02: qty 17

## 2024-02-02 NOTE — Progress Notes (Signed)
 [facility] Cancer Follow up:    Sherry Artist PARAS, MD [address] [place] [address]   DIAGNOSIS:  Cancer Staging  Malignant neoplasm of upper-inner quadrant of right breast in female, estrogen receptor positive (HCC) Staging form: Breast, AJCC 7th Edition - Pathologic stage from 07/14/2012: Stage IV (TX, NX, M1) - Signed by [name], MD on 08/28/2021 Specimen type: Core Needle Biopsy Histopathologic type: 9931 Laterality: Right    SUMMARY OF ONCOLOGIC HISTORY: Oncology History  Malignant neoplasm of upper-inner quadrant of right breast in female, estrogen receptor positive (HCC)  07/13/2012 Clinical Stage   Stage IA: T1c N0   07/14/2012 Definitive Surgery   Bilateral mastectomy/right SLNB: RIGHT invasive ductal carcinoma, grade 3, ER+, PR +, Her2/neu positive (ratio 3.02), Ki67 48%. DCIS. 1/4 LN positive for malignancy. LEFT: benign   07/14/2012 Pathologic Stage   Stage IIB: T2 N1a M0   07/14/2012 Cancer Staging   Staging form: Breast, AJCC 7th Edition - Pathologic stage from 07/14/2012: Stage IV (TX, NX, M1) - Signed by [name], MD on 08/28/2021 Specimen type: Core Needle Biopsy Histopathologic type: 9931 Laterality: Right   08/17/2012 - 08/30/2013 Chemotherapy   Adjuvant carboplatin , docetaxel , and trastuzumab  x 6 cycles (completed 11/30/2012) followed by maintenance trastuzumab  to total one year   11/2012 Procedure   Comp Cancer Gene panel (GeneDx) negative for deleterious mutations    - 02/2013 Radiation Therapy   Adjuvant RT to right breast   02/2013 -  Anti-estrogen oral therapy   Tamoxifen  20 mg daily   09/24/2013 Surgery   Bilateral breast reconstruction with latissmus flap and expander placement   12/12/2013 Surgery   Implant placement    Relapse/Recurrence   Left sided malignant pleural effusion.  Tumor cells are positive for GATA3 and ER, negative for TTF-1 consistent with recurrent breast carcinoma Prognostic showed ER 90%  positive strong staining, PR 80% positive strong staining, HER2 negative.    09/16/2021 Imaging   PET scan showed malignant left pleural effusion with extensive areas of nodularity and hypermetabolic activity involving the mediastinal border and pericardium as well as the most inferior aspects of the costodiaphragmatic recess.  Signs of nodal disease at the thoracic inlet on the left suspect extension of disease below the diaphragm along the left anterolateral aorta.  Nonspecific moderate to markedly increased metabolic activity about the base of the tongue bilaterally.  Consider direct visualization showing mildly asymmetric uptake favoring the right lingual tonsil.  Sclerotic foci without marked increased metabolic activity suspicious for metastatic disease perhaps treated or questions.  Heterogeneous marrow uptake seen elsewhere generalized and nonspecific.  Consider spinal MRI is warranted  MRI brain without any evidence of intracranial metastatic disease.   10/09/2021 - 02/11/2022 Chemotherapy   Taxotere , Herceptin , Perjeta  x 6   12/04/2021 Imaging   There is no evidence new metastatic disease. There is interval decrease in the left pleural effusion possibly suggesting resolving pleural metastatic disease. Few scattered sclerotic metastatic lesions in the skeletal structures have not changed significantly.   No acute findings are seen in the chest abdomen and pelvis.     02/22/2022 -  Antibody Plan   Herceptin /Perjeta  every 3 weeks   03/21/2022 Imaging   CT chest abdomen pelvis  IMPRESSION: 1. No significant change since previous study. Again demonstrated is a chronic small left pleural effusion with basilar atelectasis and scattered sclerotic skeletal metastases. 2. No acute abnormalities are demonstrated. 3. Small esophageal hiatal hernia.     Electronically Signed  By: [name] M.D.   On: 03/21/2022 20:23   07/22/2023 -  Chemotherapy   Patient is on Treatment Plan :  BREAST Fam-Trastuzumab Deruxtecan-nxki  (Enhertu ) (5.4) q21d       CURRENT THERAPY: Enhertu   INTERVAL HISTORY:  Discussed the use of AI scribe software for clinical note transcription with the patient, who gave verbal consent to proceed.  History of Present Illness  Sherry Barry is a 53 year old female with met breast cancer who presents for a follow-up visit while on enhertu .  She has been taking Eliquis  as prescribed for atrial fibrillation and is compliant with the medication.  Initially, she experienced constipation but managed it with Miralax . She has recently stopped taking it daily as she no longer feels the need.  She mentions a busy work schedule at the [employer], where she has been employed for eighteen years.   She also has a hobby of making custom cakes, which she finds to be a creative outlet.    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  MEDICAL HISTORY: Past Medical History:  Diagnosis Date   Allergy    Breast cancer (HCC)    Breast cancer (HCC)    right   History of chemotherapy    doxetaxel/carboplatin /trastuzumab    History of migraine    last one about a week ago   Hx of radiation therapy 01/01/13- 02/15/13   r chest wall, r  supraclav/axilla 5040 cGy/28 sessions, scar boost 1000 cGy/5 sessions   Hypertension    no meds,   urgent care on pomana   Migraine    Migraine     SURGICAL HISTORY: Past Surgical History:  Procedure Laterality Date   ABDOMINAL HYSTERECTOMY     no salpingo-oophorectomy 2009   BREAST RECONSTRUCTION WITH PLACEMENT OF TISSUE EXPANDER AND FLEX HD (ACELLULAR HYDRATED DERMIS) Left 09/24/2013   IR IMAGING GUIDED PORT INSERTION  10/07/2021   IR THORACENTESIS ASP PLEURAL SPACE W/IMG GUIDE  08/11/2021   IR THORACENTESIS ASP PLEURAL SPACE W/IMG GUIDE  10/07/2021   LATISSIMUS FLAP TO BREAST Right 09/24/2013   Procedure: RIGHT LATISSMUS MYOCUTAEIOUS MUSCLE FLAP AND PLACEMENT OF TISSUE LESLEIGH;  Surgeon: Estefana Reichert, DO;  Location: [facility];  Service: Plastics;  Laterality: Right;   LIPOSUCTION WITH LIPOFILLING Bilateral 12/12/2013   Procedure: LIPOSUCTION WITH LIPOFILLING;  Surgeon: Estefana Reichert, DO;  Location: [facility];  Service: Plastics;  Laterality: Bilateral;   PORT-A-CATH REMOVAL Left 12/12/2013   Procedure: REMOVAL PORT-A-CATH;  Surgeon: Estefana Reichert, DO;  Location: [facility];  Service: Plastics;  Laterality: Left;   PORTACATH PLACEMENT  07/14/2012   Procedure: INSERTION PORT-A-CATH;  Surgeon: Debby LABOR. Cornett, MD;  Location: [facility];  Service: General;  Laterality: Left;   RECONSTRUCTION BREAST W/ LATISSIMUS DORSI FLAP Right 09/24/2013   & tissue expander placement   REMOVAL OF  BILATERAL TISSUE EXPANDERS WITH PLACEMENT OF BILATERAL BREAST IMPLANTS Bilateral 12/12/2013   Procedure: REMOVAL OF BILATERAL TISSUE EXPANDERS WITH PLACEMENT OF BILATERAL BREAST IMPLANTS/BILATERAL CAPSULECTOMIES WITH  LIPOFILLING FAT GRAFTING;  Surgeon: Estefana Reichert, DO;  Location: [facility];  Service: Plastics;  Laterality: Bilateral;   SIMPLE MASTECTOMY WITH AXILLARY SENTINEL NODE BIOPSY  07/14/2012   Procedure: SIMPLE MASTECTOMY WITH AXILLARY SENTINEL NODE BIOPSY;  Surgeon:  Debby LABOR. Cornett, MD;  Location: [facility];  Service: General;  Laterality: Right;  Bilateral simple mastectomy with port and right sebtibel lymph node mapping   SIMPLE MASTECTOMY WITH AXILLARY SENTINEL NODE BIOPSY  07/14/2012   Procedure: SIMPLE MASTECTOMY;  Surgeon: Debby LABOR. Cornett, MD;  Location: [facility];  Service: General;  Laterality: Left;   TISSUE EXPANDER PLACEMENT Left 09/24/2013   Procedure: PLACEMENT OF TISSUE EXPANDER AND FLEX HD TO LEFT BREAST;  Surgeon: Estefana Reichert, DO;  Location: [facility];  Service: Plastics;  Laterality: Left;    SOCIAL HISTORY: Social History   Socioeconomic History   Marital status: Married    Spouse name: Not on file   Number of children: 2   Years of education: Not on file   Highest education level: Not on file  Occupational History    Employer: [employer]  Tobacco Use   Smoking status: Never   Smokeless tobacco: Never  Substance and Sexual Activity   Alcohol use: Yes    Comment: occasional   Drug use: No   Sexual activity: Yes    Birth control/protection: Surgical  Other Topics Concern   Not on file  Social History Narrative   Not on file   Social Drivers of Health   Financial Resource Strain: Not on file  Food Insecurity: No Food Insecurity (12/11/2023)   Hunger Vital Sign    Worried About Running Out of Food in the Last Year: Never true    Ran Out of Food in the Last Year: Never true  Transportation Needs: No Transportation Needs (12/11/2023)   PRAPARE - Administrator, Civil Service (Medical): No    Lack of Transportation (Non-Medical): No  Physical Activity: Not on file  Stress: Not on file  Social Connections: Not on file  Intimate Partner Violence: Not At Risk (12/11/2023)   Humiliation, Afraid, Rape, and Kick questionnaire    Fear of Current or Ex-Partner: No    Emotionally Abused: No    Physically Abused: No    Sexually Abused: No    FAMILY HISTORY: Family History  Problem Relation Age of Onset   Hypertension  Mother    Alcohol abuse Mother    Heart disease Maternal Grandmother    Stroke Maternal Grandfather    Colon cancer Maternal Aunt 55       alive at 61   Brain cancer Maternal Uncle 71       and lymphoma in early 18s; deceased   Brain cancer Maternal Uncle 60       deceased   Pancreatic cancer Maternal Uncle 45       alive at 74   Breast cancer Cousin 43       mat 1st cousin once removed through mat GF ; deceased   Breast cancer Maternal Aunt        great aunt through mat GF; dx at ? age    Review of Systems  Constitutional:  Negative for appetite change, chills, fatigue, fever and unexpected weight change.  HENT:   Negative for hearing loss,  lump/mass and trouble swallowing.   Eyes:  Negative for eye problems and icterus.  Respiratory:  Negative for chest tightness, cough and shortness of breath.   Cardiovascular:  Negative for chest pain, leg swelling and palpitations.  Gastrointestinal:  Negative for abdominal distention, abdominal pain, constipation, diarrhea, nausea and vomiting.  Endocrine: Negative for hot flashes.  Genitourinary:  Negative for difficulty urinating.   Musculoskeletal:  Negative for arthralgias.  Skin:  Negative for itching and rash.  Neurological:  Negative for dizziness, extremity weakness, headaches and numbness.  Hematological:  Negative for adenopathy. Does not bruise/bleed easily.  Psychiatric/Behavioral:  Negative for depression. The patient is not nervous/anxious.       PHYSICAL EXAMINATION   Onc Performance Status - 02/02/24 1300       KPS SCALE   KPS % SCORE Able to carry on normal activity, minor s/s of disease           Vitals:   02/02/24 1327  BP: (!) 138/99  Pulse: (!) 102  Resp: 18  Temp: 98.1 F (36.7 C)  SpO2: 100%    Physical Exam Constitutional:      General: She is not in acute distress.    Appearance: Normal appearance. She is not toxic-appearing.  HENT:     Head: Normocephalic and atraumatic.     Mouth/Throat:      Mouth: Mucous membranes are moist.     Pharynx: Oropharynx is clear. No oropharyngeal exudate or posterior oropharyngeal erythema.  Eyes:     General: No scleral icterus. Cardiovascular:     Rate and Rhythm: Normal rate and regular rhythm.     Pulses: Normal pulses.     Heart sounds: Normal heart sounds.  Pulmonary:     Effort: Pulmonary effort is normal.     Breath sounds: Normal breath sounds.  Abdominal:     General: Abdomen is flat. Bowel sounds are normal. There is no distension.     Palpations: Abdomen is soft.     Tenderness: There is no abdominal tenderness.  Musculoskeletal:        General: No swelling.     Cervical back: Neck supple.  Lymphadenopathy:     Cervical: No cervical adenopathy.  Skin:    General: Skin is warm and dry.     Findings: No rash.  Neurological:     General: No focal deficit present.     Mental Status: She is alert.  Psychiatric:        Mood and Affect: Mood normal.        Behavior: Behavior normal.     LABORATORY DATA:  CBC    Component Value Date/Time   WBC 5.3 02/02/2024 1252   WBC 11.1 (H) 12/12/2023 0353   RBC 4.37 02/02/2024 1252   HGB 13.2 02/02/2024 1252   HGB 15.9 10/30/2019 1030   HGB 14.7 02/15/2017 0852   HCT 38.1 02/02/2024 1252   HCT 46.1 10/30/2019 1030   HCT 42.9 02/15/2017 0852   PLT 324 02/02/2024 1252   PLT 245 10/30/2019 1030   MCV 87.2 02/02/2024 1252   MCV 88 10/30/2019 1030   MCV 86.7 02/15/2017 0852   MCH 30.2 02/02/2024 1252   MCHC 34.6 02/02/2024 1252   RDW 14.5 02/02/2024 1252   RDW 12.5 10/30/2019 1030   RDW 12.6 02/15/2017 0852   LYMPHSABS 1.4 02/02/2024 1252   LYMPHSABS 1.4 02/15/2017 0852   MONOABS 0.6 02/02/2024 1252   MONOABS 0.3 02/15/2017 0852   EOSABS 0.2 02/02/2024 1252  EOSABS 0.1 02/15/2017 0852   BASOSABS 0.0 02/02/2024 1252   BASOSABS 0.0 02/15/2017 0852    CMP     Component Value Date/Time   NA 142 01/12/2024 1428   NA 140 10/30/2019 1030   NA 141 02/15/2017 0852   K  3.4 (L) 01/12/2024 1428   K 3.9 02/15/2017 0852   CL 108 01/12/2024 1428   CL 101 11/15/2012 0904   CO2 30 01/12/2024 1428   CO2 25 02/15/2017 0852   GLUCOSE 93 01/12/2024 1428   GLUCOSE 87 02/15/2017 0852   GLUCOSE 69 (L) 11/15/2012 0904   BUN 13 01/12/2024 1428   BUN 11 10/30/2019 1030   BUN 12.5 02/15/2017 0852   CREATININE 0.82 01/12/2024 1428   CREATININE 0.9 02/15/2017 0852   CALCIUM 8.8 (L) 01/12/2024 1428   CALCIUM 9.3 02/15/2017 0852   PROT 6.4 (L) 01/12/2024 1428   PROT 6.7 10/30/2019 1030   PROT 7.0 02/15/2017 0852   ALBUMIN 3.7 01/12/2024 1428   ALBUMIN 4.2 10/30/2019 1030   ALBUMIN 3.6 02/15/2017 0852   AST 22 01/12/2024 1428   AST 16 02/15/2017 0852   ALT 22 01/12/2024 1428   ALT 13 02/15/2017 0852   ALKPHOS 51 01/12/2024 1428   ALKPHOS 63 02/15/2017 0852   BILITOT 0.4 01/12/2024 1428   BILITOT 0.69 02/15/2017 0852   GFRNONAA >60 01/12/2024 1428   GFRAA 100 10/30/2019 1030     ASSESSMENT and THERAPY PLAN:   Assessment and Plan Assessment & Plan Stage 4 triple positive breast cancer CT scan with ongoing response. She is tolerating enhertu  very well. - Continue Enhertu  every three weeks. - ECHO ordered by cardio oncology,  - Labs from today pending.  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 - Continue Eliquis  indefinitely  Constipation Constipation has improved with Miralax . - Adjust Miralax  to every other day to maintain bowel regularity.  All questions were answered. The patient knows to call the clinic with any problems, questions or concerns. We can certainly see the patient much sooner if necessary.  Total encounter time:20 minutes*in face-to-face visit time, chart review, lab review, care coordination, order entry, and documentation of the encounter time.  *Total Encounter Time as defined by the Centers for Medicare and  Medicaid Services includes, in addition to the face-to-face time of a patient visit (documented in the note above) non-face-to-face time: obtaining and reviewing outside history, ordering and reviewing medications, tests or procedures, care coordination (communications with other health care professionals or caregivers) and documentation in the medical record.

## 2024-02-02 NOTE — Patient Instructions (Signed)

## 2024-02-16 ENCOUNTER — Other Ambulatory Visit: Payer: Self-pay | Admitting: Hematology and Oncology

## 2024-02-16 ENCOUNTER — Other Ambulatory Visit: Payer: Self-pay

## 2024-02-19 ENCOUNTER — Other Ambulatory Visit (HOSPITAL_COMMUNITY): Payer: Self-pay

## 2024-02-22 ENCOUNTER — Ambulatory Visit (HOSPITAL_COMMUNITY): Payer: Self-pay | Admitting: Cardiology

## 2024-02-22 ENCOUNTER — Ambulatory Visit (HOSPITAL_COMMUNITY)
Admission: RE | Admit: 2024-02-22 | Discharge: 2024-02-22 | Disposition: A | Discharge status: Home / Self Care | Source: Ambulatory Visit | Attending: Cardiology | Admitting: Cardiology

## 2024-02-22 ENCOUNTER — Encounter (HOSPITAL_COMMUNITY): Payer: Self-pay | Admitting: Cardiology

## 2024-02-22 ENCOUNTER — Ambulatory Visit (HOSPITAL_COMMUNITY)
Admission: RE | Admit: 2024-02-22 | Discharge: 2024-02-22 | Disposition: A | Discharge status: Home / Self Care | Source: Ambulatory Visit | Attending: Family Medicine | Admitting: Family Medicine

## 2024-02-22 VITALS — BP 132/78 | HR 85 | Ht 62.5 in | Wt 162.2 lb

## 2024-02-22 DIAGNOSIS — Z853 Personal history of malignant neoplasm of breast: Secondary | ICD-10-CM | POA: Diagnosis not present

## 2024-02-22 DIAGNOSIS — C50211 Malignant neoplasm of upper-inner quadrant of right female breast: Secondary | ICD-10-CM | POA: Diagnosis not present

## 2024-02-22 DIAGNOSIS — I81 Portal vein thrombosis: Secondary | ICD-10-CM | POA: Insufficient documentation

## 2024-02-22 DIAGNOSIS — Z79899 Other long term (current) drug therapy: Secondary | ICD-10-CM | POA: Insufficient documentation

## 2024-02-22 DIAGNOSIS — Z7901 Long term (current) use of anticoagulants: Secondary | ICD-10-CM | POA: Insufficient documentation

## 2024-02-22 DIAGNOSIS — I10 Essential (primary) hypertension: Secondary | ICD-10-CM | POA: Diagnosis not present

## 2024-02-22 DIAGNOSIS — Z170 Estrogen receptor positive status [ER+]: Secondary | ICD-10-CM | POA: Diagnosis not present

## 2024-02-22 DIAGNOSIS — Z0189 Encounter for other specified special examinations: Secondary | ICD-10-CM

## 2024-02-22 DIAGNOSIS — Z9013 Acquired absence of bilateral breasts and nipples: Secondary | ICD-10-CM | POA: Insufficient documentation

## 2024-02-22 LAB — ECHOCARDIOGRAM COMPLETE: S' Lateral: 2.8 cm

## 2024-02-22 MED FILL — Fosaprepitant Dimeglumine For IV Infusion 150 MG (Base Eq): INTRAVENOUS | Qty: 5 | Status: AC

## 2024-02-22 NOTE — Progress Notes (Signed)
 Oncology: Dr. Loretha  Chest complaint: Fatigue  53 y.o. with history of stage IV triple positive breast cancer presents for cardio-oncology evaluation.  Initial diagnosis in 1/14.  Bilateral mastectomy, ER+/PR+/HER2+.  She was treated with carboplatin /docetaxel /trastuzumab  x 6 cycles then trastuzumab  alone to complete 1 year.  She had radiation as well. In 4/23, patient was found to have recurrence with left malignant pleural effusion. She completed chemo with docetaxel /trastuzumab /pertuzumab  was continued on trastuzumab .    Echo in 10/23 showed. EF 60-65% with GLS -20.4% (stable). Echo in 1/24 showed EF 60-65%, RV normal, GLS less negative at -15.5% but poor tracking of the endocardium. Echo in 4/24 showed EF 55-60%, GLS -18.4%, normal diastolic function, normal RV.    Echo in 7/24 showed EF 55-60%, normal diastolic function, GLS -20.4%, normal RV.   Echo in 1/25 showed EF 55-60%, GLS -19%, RV normal, PASP 23 mmHg.   Patient had portal vein thrombosis in 6/25 and was started on Eliquis .   Echo was done today and reviewed, EF 55-60%, grade 1 diastolic dysfunction, GLS -12.9% (endocardium not well-tracked), IVC normal.   Patient did not have cardiac complications from her initial chemotherapy involving trastuzumab  in 2014.  She does have treated HTN.  No family history of cardiomyopathy or early CAD.  Nonsmoker.  She has been switched to Enhertu .   She is doing well in general.  No exertional dyspnea or chest pain.  Good exercise tolerance.    Labs (5/23): K 3.3, creatinine 0.64 Labs (1/24): EF 3.3, creatinine 0.66, hgb 14.6 Labs (4/24): K 2.9, creatinine 0.51 Labs (12/24): K 3.6, creatinine 0.69 Labs (8/25): K 3.7, creatinine 0.63  PMH: 1. HTN 2. Migraines 3. Breast cancer: Initial diagnosis in 1/14.  Bilateral mastectomy, ER+/PR+/HER2+.  She was treated with carboplatin /docetaxel /trastuzumab  x 6 cycles then trastuzumab  alone to complete 1 year.  She had radiation.  - 4/23 patient had  recurrence with left malignant pleural effusion found. She has started chemo with docetaxel /trastuzumab /pertuzumab .  - Echo (4/23): EF 60-65%, GLS -16.8%, RV normal.  - Echo (7/23): EF 65-70%, GLS -21.3%, RV normal.  - Echo (10/23): EF 60-65%, GLS -20.4%, normal diastolic function, normal RV, mild MR - Echo (1/24): EF 60-65%, RV normal, GLS less negative at -15.5% but poor tracking of the endocardium.  - Echo (4/24): EF 55-60%, GLS -18.4%, normal diastolic function, normal RV.  - Echo (7/24): EF 55-60%, normal diastolic function, GLS -20.4%, normal RV.  - Echo (1/25): EF 55-60%, GLS -19%, RV normal, PASP 23 mmHg.  - Echo (4/25): EF 60-65%, normal diastolic function, normal RV, IVC normal.   - Echo (9/25): EF 55-60%, grade 1 diastolic dysfunction, GLS -12.9% (endocardium not well-tracked), IVC normal.   Social History   Socioeconomic History   Marital status: Married    Spouse name: Not on file   Number of children: 2   Years of education: Not on file   Highest education level: Not on file  Occupational History    Employer: [employer]  Tobacco Use   Smoking status: Never   Smokeless tobacco: Never  Substance and Sexual Activity   Alcohol use: Yes    Comment: occasional   Drug use: No   Sexual activity: Yes    Birth control/protection: Surgical  Other Topics Concern   Not on file  Social History Narrative   Not on file   Social Drivers of Health   Financial Resource Strain: Not on file  Food Insecurity: No Food Insecurity (12/11/2023)   Hunger Vital  Sign    Worried About Programme researcher, broadcasting/film/video in the Last Year: Never true    Ran Out of Food in the Last Year: Never true  Transportation Needs: No Transportation Needs (12/11/2023)   PRAPARE - Administrator, Civil Service (Medical): No    Lack of Transportation (Non-Medical): No  Physical Activity: Not on file  Stress: Not on file  Social Connections: Not on file  Intimate Partner Violence: Not At Risk  (12/11/2023)   Humiliation, Afraid, Rape, and Kick questionnaire    Fear of Current or Ex-Partner: No    Emotionally Abused: No    Physically Abused: No    Sexually Abused: No   Family History  Problem Relation Age of Onset   Hypertension Mother    Alcohol abuse Mother    Heart disease Maternal Grandmother    Stroke Maternal Grandfather    Colon cancer Maternal Aunt 55       alive at 43   Brain cancer Maternal Uncle 58       and lymphoma in early 48s; deceased   Brain cancer Maternal Uncle 60       deceased   Pancreatic cancer Maternal Uncle 45       alive at 53   Breast cancer Cousin 90       mat 1st cousin once removed through mat GF ; deceased   Breast cancer Maternal Aunt        great aunt through mat GF; dx at ? age   ROS: All systems reviewed and negative except as per HPI.   Current Outpatient Medications  Medication Sig Dispense Refill   acetaminophen  (TYLENOL ) 500 MG tablet Take 1,000 mg by mouth every 6 (six) hours as needed for moderate pain.     apixaban  (ELIQUIS ) 5 MG TABS tablet Take 1 tablet (5 mg total) by mouth 2 (two) times daily. 60 tablet 6   carvedilol  (COREG ) 3.125 MG tablet Take 3.125 mg by mouth 2 (two) times daily.     cholecalciferol  (VITAMIN D3) 25 MCG (1000 UNIT) tablet Take 1,000 Units by mouth daily.     dexamethasone  (DECADRON ) 4 MG tablet Take 2 tablets (8 mg) by mouth daily for 3 days starting the day after chemotherapy. Take with food. 30 tablet 1   lidocaine -prilocaine  (EMLA ) cream Apply 1 Application topically as needed. 30 g 0   MAGNESIUM PO Take 1 tablet by mouth every evening.     OLANZapine  (ZYPREXA ) 2.5 MG tablet TAKE 1 TABLET BY MOUTH AT BEDTIME. 90 tablet 1   ondansetron  (ZOFRAN ) 8 MG tablet Take 1 tablet (8 mg total) by mouth every 8 (eight) hours as needed for nausea or vomiting. Start on the third day after chemotherapy. 30 tablet 1   polyethylene glycol powder (GLYCOLAX /MIRALAX ) 17 GM/SCOOP powder Take 17 grams dissolved in liquid  by mouth daily. 238 g 0   potassium chloride  SA (KLOR-CON  M20) 20 MEQ tablet Take 1 tablet (20 mEq total) by mouth 2 (two) times daily. 60 tablet 3   prochlorperazine  (COMPAZINE ) 10 MG tablet Take 1 tablet (10 mg total) by mouth every 6 (six) hours as needed for nausea or vomiting. 30 tablet 1   No current facility-administered medications for this encounter.   Facility-Administered Medications Ordered in Other Encounters  Medication Dose Route Frequency Provider Last Rate Last Admin   acetaminophen  (TYLENOL ) 325 MG tablet            diphenhydrAMINE  (BENADRYL ) 25 mg capsule  BP 132/78   Pulse 85   Ht 5' 2.5 (1.588 m)   Wt 73.6 kg (162 lb 3.2 oz)   SpO2 98%   BMI 29.19 kg/m  General: NAD Neck: No JVD, no thyromegaly or thyroid  nodule.  Lungs: Clear to auscultation bilaterally with normal respiratory effort. CV: Nondisplaced PMI.  Heart regular S1/S2, no S3/S4, no murmur.  No peripheral edema.  No carotid bruit.  Normal pedal pulses.  Abdomen: Soft, nontender, no hepatosplenomegaly, no distention.  Skin: Intact without lesions or rashes.  Neurologic: Alert and oriented x 3.  Psych: Normal affect. Extremities: No clubbing or cyanosis.  HEENT: Normal.   Assessment/Plan: 1. Stage IV triple positive breast cancer: Patient had trastuzumab  as part of her therapy back in 2014 with no cardiac complications.  She has been back on trastuzumab -based therapy for > 1 year now.  She was recently started on Enhertu .  Echo today on Enhertu  was reviewed and shows EF 55-60%, grade 1 diastolic dysfunction, GLS -12.9% (endocardium not well-tracked), IVC normal.  I think that this echo is normal and strain is not accurate.  - Continue Coreg  3.125 mg bid.  - She will continue Enhertu  and I will repeat echo in 6 months.  2. HTN: BP controlled.  3. Portal vein thrombosis: She is on Eliquis , will continue long-term.   Followup 6 months with echo.  I spent 31 minutes reviewing records,  interviewing/examining patient, and managing orders.   Ezra Shuck 02/22/2024

## 2024-02-22 NOTE — Progress Notes (Signed)
  Echocardiogram 2D Echocardiogram has been performed.  Norleen ORN [facility] 02/22/2024, 8:50 AM

## 2024-02-22 NOTE — Patient Instructions (Addendum)
 Good to see you today!  Your physician has requested that you have an echocardiogram. Echocardiography is a painless test that uses sound waves to create images of your heart. It provides your doctor with information about the size and shape of your heart and how well your heart's chambers and valves are working. This procedure takes approximately one hour. There are no restrictions for this procedure. Please do NOT wear cologne, perfume, aftershave, or lotions (deodorant is allowed). Please arrive 15 minutes prior to your appointment time.  Please note: We ask at that you not bring children with you during ultrasound (echo/ vascular) testing. Due to room size and safety concerns, children are not allowed in the ultrasound rooms during exams. Our front office staff cannot provide observation of children in our lobby area while testing is being conducted. An adult accompanying a patient to their appointment will only be allowed in the ultrasound room at the discretion of the ultrasound technician under special circumstances. We apologize for any inconvenience.  Your physician recommends that you schedule a follow-up appointment in: 6 months with echocardiogram(March 2026) Call office in January t schedule an appointment  If you have any questions or concerns before your next appointment please send us  a message through [facility] or call our office at [phone].    TO LEAVE A MESSAGE FOR THE NURSE SELECT OPTION 2, PLEASE LEAVE A MESSAGE INCLUDING: YOUR NAME DATE OF BIRTH CALL BACK NUMBER REASON FOR CALL**this is important as we prioritize the call backs  YOU WILL RECEIVE A CALL BACK THE SAME DAY AS LONG AS YOU CALL BEFORE 4:00 PM At the [facility], you and your health needs are our priority. As part of our continuing mission to provide you with exceptional heart care, we have created designated Provider Care Teams. These Care Teams include your primary Cardiologist (physician)  and Advanced Practice Providers (APPs- Physician Assistants and Nurse Practitioners) who all work together to provide you with the care you need, when you need it.   You may see any of the following providers on your designated Care Team at your next follow up: Dr Toribio Fuel Dr Ezra Shuck Dr. Ria Commander Dr. Morene Brownie Amy Lenetta, NP Caffie Shed, [place] [facility] [place], [place] Beckey Coe, NP [place] Lee, NP Ellouise Class, NP Tinnie Redman, PharmD Jaun Bash, PharmD   Please be sure to bring in all your medications bottles to every appointment.    Thank you for choosing [facility]

## 2024-02-23 ENCOUNTER — Inpatient Hospital Stay (HOSPITAL_BASED_OUTPATIENT_CLINIC_OR_DEPARTMENT_OTHER): Admitting: Adult Health

## 2024-02-23 ENCOUNTER — Inpatient Hospital Stay

## 2024-02-23 ENCOUNTER — Encounter: Payer: Self-pay | Admitting: Adult Health

## 2024-02-23 ENCOUNTER — Inpatient Hospital Stay: Attending: Hematology and Oncology

## 2024-02-23 ENCOUNTER — Encounter: Payer: Self-pay | Admitting: Hematology and Oncology

## 2024-02-23 VITALS — BP 136/92 | HR 91 | Temp 97.9°F | Resp 17 | Ht 62.5 in | Wt 161.0 lb

## 2024-02-23 DIAGNOSIS — Z807 Family history of other malignant neoplasms of lymphoid, hematopoietic and related tissues: Secondary | ICD-10-CM | POA: Diagnosis not present

## 2024-02-23 DIAGNOSIS — Z9013 Acquired absence of bilateral breasts and nipples: Secondary | ICD-10-CM | POA: Diagnosis not present

## 2024-02-23 DIAGNOSIS — Z811 Family history of alcohol abuse and dependence: Secondary | ICD-10-CM | POA: Insufficient documentation

## 2024-02-23 DIAGNOSIS — Z9221 Personal history of antineoplastic chemotherapy: Secondary | ICD-10-CM | POA: Diagnosis not present

## 2024-02-23 DIAGNOSIS — I10 Essential (primary) hypertension: Secondary | ICD-10-CM | POA: Insufficient documentation

## 2024-02-23 DIAGNOSIS — Z7901 Long term (current) use of anticoagulants: Secondary | ICD-10-CM | POA: Diagnosis not present

## 2024-02-23 DIAGNOSIS — Z808 Family history of malignant neoplasm of other organs or systems: Secondary | ICD-10-CM | POA: Diagnosis not present

## 2024-02-23 DIAGNOSIS — Z170 Estrogen receptor positive status [ER+]: Secondary | ICD-10-CM | POA: Insufficient documentation

## 2024-02-23 DIAGNOSIS — Z803 Family history of malignant neoplasm of breast: Secondary | ICD-10-CM | POA: Insufficient documentation

## 2024-02-23 DIAGNOSIS — Z800 Family history of malignant neoplasm of digestive organs: Secondary | ICD-10-CM | POA: Insufficient documentation

## 2024-02-23 DIAGNOSIS — Z79810 Long term (current) use of selective estrogen receptor modulators (SERMs): Secondary | ICD-10-CM | POA: Diagnosis not present

## 2024-02-23 DIAGNOSIS — Z923 Personal history of irradiation: Secondary | ICD-10-CM | POA: Insufficient documentation

## 2024-02-23 DIAGNOSIS — C50211 Malignant neoplasm of upper-inner quadrant of right female breast: Secondary | ICD-10-CM | POA: Insufficient documentation

## 2024-02-23 DIAGNOSIS — Z5112 Encounter for antineoplastic immunotherapy: Secondary | ICD-10-CM | POA: Insufficient documentation

## 2024-02-23 DIAGNOSIS — Z1721 Progesterone receptor positive status: Secondary | ICD-10-CM | POA: Diagnosis not present

## 2024-02-23 DIAGNOSIS — Z86718 Personal history of other venous thrombosis and embolism: Secondary | ICD-10-CM | POA: Insufficient documentation

## 2024-02-23 DIAGNOSIS — C7951 Secondary malignant neoplasm of bone: Secondary | ICD-10-CM | POA: Insufficient documentation

## 2024-02-23 DIAGNOSIS — Z1731 Human epidermal growth factor receptor 2 positive status: Secondary | ICD-10-CM | POA: Insufficient documentation

## 2024-02-23 LAB — CMP (CANCER CENTER ONLY)
ALT: 15 U/L (ref 0–44)
AST: 21 U/L (ref 15–41)
Albumin: 4.1 g/dL (ref 3.5–5.0)
Alkaline Phosphatase: 53 U/L (ref 38–126)
Anion gap: 5 (ref 5–15)
BUN: 12 mg/dL (ref 6–20)
CO2: 29 mmol/L (ref 22–32)
Calcium: 9.8 mg/dL (ref 8.9–10.3)
Chloride: 109 mmol/L (ref 98–111)
Creatinine: 0.71 mg/dL (ref 0.44–1.00)
GFR, Estimated: >60 mL/min (ref >60)
Glucose, Bld: 93 mg/dL (ref 70–99)
Potassium: 3.8 mmol/L (ref 3.5–5.1)
Sodium: 143 mmol/L (ref 135–145)
Total Bilirubin: 0.4 mg/dL (ref 0.0–1.2)
Total Protein: 6.8 g/dL (ref 6.5–8.1)

## 2024-02-23 LAB — CBC WITH DIFFERENTIAL (CANCER CENTER ONLY)
Abs Immature Granulocytes: 0.02 K/uL (ref 0.00–0.07)
Basophils Absolute: 0 K/uL (ref 0.0–0.1)
Basophils Relative: 1 %
Eosinophils Absolute: 0.3 K/uL (ref 0.0–0.5)
Eosinophils Relative: 6 %
HCT: 38.6 % (ref 36.0–46.0)
Hemoglobin: 13.4 g/dL (ref 12.0–15.0)
Immature Granulocytes: 0 %
Lymphocytes Relative: 26 %
Lymphs Abs: 1.2 K/uL (ref 0.7–4.0)
MCH: 29.7 pg (ref 26.0–34.0)
MCHC: 34.7 g/dL (ref 30.0–36.0)
MCV: 85.6 fL (ref 80.0–100.0)
Monocytes Absolute: 0.6 K/uL (ref 0.1–1.0)
Monocytes Relative: 12 %
Neutro Abs: 2.7 K/uL (ref 1.7–7.7)
Neutrophils Relative %: 55 %
Platelet Count: 316 K/uL (ref 150–400)
RBC: 4.51 MIL/uL (ref 3.87–5.11)
RDW: 13.8 % (ref 11.5–15.5)
WBC Count: 4.8 K/uL (ref 4.0–10.5)
nRBC: 0 % (ref 0.0–0.2)

## 2024-02-23 MED ORDER — FAM-TRASTUZUMAB DERUXTECAN-NXKI CHEMO 100 MG IV SOLR
5.4000 mg/kg | Freq: Once | INTRAVENOUS | Status: AC
  Administered 2024-02-23: 340 mg via INTRAVENOUS
  Filled 2024-02-23: qty 17

## 2024-02-23 MED ORDER — ACETAMINOPHEN 325 MG PO TABS
650.0000 mg | ORAL_TABLET | Freq: Once | ORAL | Status: AC
  Administered 2024-02-23: 650 mg via ORAL
  Filled 2024-02-23: qty 2

## 2024-02-23 MED ORDER — DEXAMETHASONE SODIUM PHOSPHATE 10 MG/ML IJ SOLN
10.0000 mg | Freq: Once | INTRAMUSCULAR | Status: AC
  Administered 2024-02-23: 10 mg via INTRAVENOUS
  Filled 2024-02-23: qty 1

## 2024-02-23 MED ORDER — PALONOSETRON HCL INJECTION 0.25 MG/5ML
0.2500 mg | Freq: Once | INTRAVENOUS | Status: AC
  Administered 2024-02-23: 0.25 mg via INTRAVENOUS
  Filled 2024-02-23: qty 5

## 2024-02-23 MED ORDER — SODIUM CHLORIDE 0.9 % IV SOLN
150.0000 mg | Freq: Once | INTRAVENOUS | Status: AC
  Administered 2024-02-23: 150 mg via INTRAVENOUS
  Filled 2024-02-23: qty 150

## 2024-02-23 MED ORDER — DIPHENHYDRAMINE HCL 25 MG PO CAPS
25.0000 mg | ORAL_CAPSULE | Freq: Once | ORAL | Status: AC
  Administered 2024-02-23: 25 mg via ORAL
  Filled 2024-02-23: qty 1

## 2024-02-23 MED ORDER — DEXTROSE 5 % IV SOLN: INTRAVENOUS | Status: DC

## 2024-02-23 MED ORDER — OLANZAPINE 5 MG PO TABS
5.0000 mg | ORAL_TABLET | Freq: Once | ORAL | Status: AC
  Administered 2024-02-23: 5 mg via ORAL
  Filled 2024-02-23: qty 1

## 2024-02-23 NOTE — Progress Notes (Signed)
 [facility] Cancer Follow up:    Sherry Artist PARAS, MD [address] [place] [address]   DIAGNOSIS: Cancer Staging  Malignant neoplasm of upper-inner quadrant of right breast in female, estrogen receptor positive (HCC) Staging form: Breast, AJCC 7th Edition - Pathologic stage from 07/14/2012: Stage IV (TX, NX, M1) - Signed by [name], MD on 08/28/2021 Specimen type: Core Needle Biopsy Histopathologic type: 9931 Laterality: Right    SUMMARY OF ONCOLOGIC HISTORY: Oncology History  Malignant neoplasm of upper-inner quadrant of right breast in female, estrogen receptor positive (HCC)  07/13/2012 Clinical Stage   Stage IA: T1c N0   07/14/2012 Definitive Surgery   Bilateral mastectomy/right SLNB: RIGHT invasive ductal carcinoma, grade 3, ER+, PR +, Her2/neu positive (ratio 3.02), Ki67 48%. DCIS. 1/4 LN positive for malignancy. LEFT: benign   07/14/2012 Pathologic Stage   Stage IIB: T2 N1a M0   07/14/2012 Cancer Staging   Staging form: Breast, AJCC 7th Edition - Pathologic stage from 07/14/2012: Stage IV (TX, NX, M1) - Signed by [name], MD on 08/28/2021 Specimen type: Core Needle Biopsy Histopathologic type: 9931 Laterality: Right   08/17/2012 - 08/30/2013 Chemotherapy   Adjuvant carboplatin , docetaxel , and trastuzumab  x 6 cycles (completed 11/30/2012) followed by maintenance trastuzumab  to total one year   11/2012 Procedure   Comp Cancer Gene panel (GeneDx) negative for deleterious mutations    - 02/2013 Radiation Therapy   Adjuvant RT to right breast   02/2013 -  Anti-estrogen oral therapy   Tamoxifen  20 mg daily   09/24/2013 Surgery   Bilateral breast reconstruction with latissmus flap and expander placement   12/12/2013 Surgery   Implant placement    Relapse/Recurrence   Left sided malignant pleural effusion.  Tumor cells are positive for GATA3 and ER, negative for TTF-1 consistent with recurrent breast carcinoma Prognostic showed ER 90%  positive strong staining, PR 80% positive strong staining, HER2 negative.    09/16/2021 Imaging   PET scan showed malignant left pleural effusion with extensive areas of nodularity and hypermetabolic activity involving the mediastinal border and pericardium as well as the most inferior aspects of the costodiaphragmatic recess.  Signs of nodal disease at the thoracic inlet on the left suspect extension of disease below the diaphragm along the left anterolateral aorta.  Nonspecific moderate to markedly increased metabolic activity about the base of the tongue bilaterally.  Consider direct visualization showing mildly asymmetric uptake favoring the right lingual tonsil.  Sclerotic foci without marked increased metabolic activity suspicious for metastatic disease perhaps treated or questions.  Heterogeneous marrow uptake seen elsewhere generalized and nonspecific.  Consider spinal MRI is warranted  MRI brain without any evidence of intracranial metastatic disease.   10/09/2021 - 02/11/2022 Chemotherapy   Taxotere , Herceptin , Perjeta  x 6   12/04/2021 Imaging   There is no evidence new metastatic disease. There is interval decrease in the left pleural effusion possibly suggesting resolving pleural metastatic disease. Few scattered sclerotic metastatic lesions in the skeletal structures have not changed significantly.   No acute findings are seen in the chest abdomen and pelvis.     02/22/2022 -  Antibody Plan   Herceptin /Perjeta  every 3 weeks   03/21/2022 Imaging   CT chest abdomen pelvis  IMPRESSION: 1. No significant change since previous study. Again demonstrated is a chronic small left pleural effusion with basilar atelectasis and scattered sclerotic skeletal metastases. 2. No acute abnormalities are demonstrated. 3. Small esophageal hiatal hernia.     Electronically Signed   By:  [name] M.D.   On: 03/21/2022 20:23   07/22/2023 -  Chemotherapy   Patient is on Treatment Plan :  BREAST Fam-Trastuzumab Deruxtecan-nxki  (Enhertu ) (5.4) q21d       CURRENT THERAPY: Enhertu   INTERVAL HISTORY:  Discussed the use of AI scribe software for clinical note transcription with the patient, who gave verbal consent to proceed.  History of Present Illness Sherry Barry is a 53 year old female with metastatic breast cancer who presents for follow-up and evaluation.  She is undergoing treatment for HER2-positive metastatic breast cancer and receives Zemaira every twelve weeks. A recent echocardiogram on September 10th, 2025, shows a left ventricular ejection fraction of 60-65% with an abnormal global longitudinal strain.  A CT scan of the chest, abdomen, and pelvis on June 27th, 2025, reveals a new thrombosis in the superior mesenteric vein, main portal vein, and intrahepatic portal venous system. The scan also shows unchanged sclerotic vertebral metastasis with no new evidence of metastatic disease in the chest, abdomen, or pelvis.  She has no new pain, cough, shortness of breath, or palpitations. She experiences fluctuations in temperature, feeling hot one day and cold the next, which she attributes to the weather and her choice of clothing.  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  MEDICAL HISTORY: Past Medical History:  Diagnosis Date   Allergy    Breast cancer (HCC)    Breast cancer (HCC)    right   History of chemotherapy    doxetaxel/carboplatin /trastuzumab    History of migraine    last one about a week ago   Hx of radiation therapy 01/01/13- 02/15/13   r chest wall, r supraclav/axilla 5040 cGy/28 sessions, scar boost 1000 cGy/5 sessions   Hypertension    no meds,   urgent care on pomana   Migraine    Migraine     SURGICAL HISTORY: Past Surgical History:  Procedure Laterality Date   ABDOMINAL HYSTERECTOMY     no salpingo-oophorectomy 2009   BREAST RECONSTRUCTION WITH PLACEMENT OF TISSUE EXPANDER AND FLEX HD (ACELLULAR HYDRATED DERMIS) Left 09/24/2013   IR IMAGING GUIDED PORT INSERTION  10/07/2021   IR THORACENTESIS ASP PLEURAL SPACE W/IMG GUIDE  08/11/2021   IR THORACENTESIS ASP PLEURAL SPACE W/IMG GUIDE  10/07/2021   LATISSIMUS FLAP TO BREAST Right 09/24/2013   Procedure: RIGHT LATISSMUS MYOCUTAEIOUS MUSCLE FLAP AND PLACEMENT OF TISSUE LESLEIGH;  Surgeon: Estefana Reichert, DO;  Location: [facility];  Service: Plastics;  Laterality: Right;   LIPOSUCTION WITH LIPOFILLING Bilateral 12/12/2013   Procedure: LIPOSUCTION WITH LIPOFILLING;  Surgeon: Estefana Reichert, DO;  Location: [facility];  Service: Plastics;  Laterality: Bilateral;   PORT-A-CATH REMOVAL Left 12/12/2013   Procedure: REMOVAL PORT-A-CATH;  Surgeon: Estefana Reichert, DO;  Location: [facility];  Service: Plastics;  Laterality: Left;   PORTACATH PLACEMENT  07/14/2012  Procedure: INSERTION PORT-A-CATH;  Surgeon: Debby LABOR. Cornett, MD;  Location: [facility];  Service: General;  Laterality: Left;   RECONSTRUCTION BREAST W/ LATISSIMUS DORSI FLAP Right 09/24/2013   & tissue expander placement   REMOVAL OF BILATERAL TISSUE EXPANDERS WITH PLACEMENT OF BILATERAL BREAST IMPLANTS Bilateral 12/12/2013   Procedure: REMOVAL OF BILATERAL TISSUE EXPANDERS WITH PLACEMENT OF  BILATERAL BREAST IMPLANTS/BILATERAL CAPSULECTOMIES WITH  LIPOFILLING FAT GRAFTING;  Surgeon: Estefana Reichert, DO;  Location:  SURGERY CENTER;  Service: Plastics;  Laterality: Bilateral;   SIMPLE MASTECTOMY WITH AXILLARY SENTINEL NODE BIOPSY  07/14/2012   Procedure: SIMPLE MASTECTOMY WITH AXILLARY SENTINEL NODE BIOPSY;  Surgeon: Debby LABOR. Cornett, MD;  Location: [facility];  Service: General;  Laterality: Right;  Bilateral simple mastectomy with port and right sebtibel lymph node mapping   SIMPLE MASTECTOMY WITH AXILLARY SENTINEL NODE BIOPSY  07/14/2012   Procedure: SIMPLE MASTECTOMY;  Surgeon: Debby LABOR. Cornett, MD;  Location: [facility];  Service: General;  Laterality: Left;   TISSUE EXPANDER PLACEMENT Left 09/24/2013   Procedure: PLACEMENT OF TISSUE EXPANDER AND FLEX HD TO LEFT BREAST;  Surgeon: Estefana Reichert, DO;  Location: [facility];  Service: Plastics;  Laterality: Left;    SOCIAL HISTORY: Social History   Socioeconomic History   Marital status: Married    Spouse name: Not on file   Number of children: 2   Years of education: Not on file   Highest education level: Not on file  Occupational History    Employer: [employer]  Tobacco Use   Smoking status: Never   Smokeless tobacco: Never  Substance and Sexual Activity   Alcohol use: Yes    Comment: occasional   Drug use: No   Sexual activity: Yes    Birth control/protection: Surgical  Other Topics Concern   Not on file  Social History Narrative   Not on file   Social Drivers of Health   Financial Resource Strain: Not on file  Food Insecurity: No Food Insecurity (12/11/2023)   Hunger Vital Sign    Worried About Running Out of Food in the Last Year: Never true    Ran Out of Food in the Last Year: Never true  Transportation Needs: No Transportation Needs (12/11/2023)   PRAPARE - Administrator, Civil Service (Medical): No    Lack of Transportation (Non-Medical): No  Physical Activity: Not on file  Stress: Not on file   Social Connections: Not on file  Intimate Partner Violence: Not At Risk (12/11/2023)   Humiliation, Afraid, Rape, and Kick questionnaire    Fear of Current or Ex-Partner: No    Emotionally Abused: No    Physically Abused: No    Sexually Abused: No    FAMILY HISTORY: Family History  Problem Relation Age of Onset   Hypertension Mother    Alcohol abuse Mother    Heart disease Maternal Grandmother    Stroke Maternal Grandfather    Colon cancer Maternal Aunt 55       alive at 60   Brain cancer Maternal Uncle 17       and lymphoma in early 55s; deceased   Brain cancer Maternal Uncle 60       deceased   Pancreatic cancer Maternal Uncle 45       alive at 74   Breast cancer Cousin 89       mat 1st cousin once removed through mat GF ; deceased   Breast cancer Maternal Aunt  great aunt through mat GF; dx at ? age    Review of Systems - Oncology    PHYSICAL EXAMINATION   Onc Performance Status - 02/23/24 0930       ECOG Perf Status   ECOG Perf Status Fully active, able to carry on all pre-disease performance without restriction      KPS SCALE   KPS % SCORE Able to carry on normal activity, minor s/s of disease          Vitals:   02/23/24 0929  BP: (!) 136/92  Pulse: 91  Resp: 17  Temp: 97.9 F (36.6 C)  SpO2: 100%    Physical Exam Constitutional:      General: She is not in acute distress.    Appearance: Normal appearance. She is not toxic-appearing.  HENT:     Head: Normocephalic and atraumatic.     Mouth/Throat:     Mouth: Mucous membranes are moist.     Pharynx: Oropharynx is clear. No oropharyngeal exudate or posterior oropharyngeal erythema.  Eyes:     General: No scleral icterus. Cardiovascular:     Rate and Rhythm: Normal rate and regular rhythm.     Pulses: Normal pulses.     Heart sounds: Normal heart sounds.  Pulmonary:     Effort: Pulmonary effort is normal.     Breath sounds: Normal breath sounds.  Abdominal:     General: Abdomen is  flat. Bowel sounds are normal. There is no distension.     Palpations: Abdomen is soft.     Tenderness: There is no abdominal tenderness.  Musculoskeletal:        General: No swelling.     Cervical back: Neck supple.  Lymphadenopathy:     Cervical: No cervical adenopathy.  Skin:    General: Skin is warm and dry.     Findings: No rash.  Neurological:     General: No focal deficit present.     Mental Status: She is alert.  Psychiatric:        Mood and Affect: Mood normal.        Behavior: Behavior normal.     LABORATORY DATA:  CBC    Component Value Date/Time   WBC 4.8 02/23/2024 0911   WBC 11.1 (H) 12/12/2023 0353   RBC 4.51 02/23/2024 0911   HGB 13.4 02/23/2024 0911   HGB 15.9 10/30/2019 1030   HGB 14.7 02/15/2017 0852   HCT 38.6 02/23/2024 0911   HCT 46.1 10/30/2019 1030   HCT 42.9 02/15/2017 0852   PLT 316 02/23/2024 0911   PLT 245 10/30/2019 1030   MCV 85.6 02/23/2024 0911   MCV 88 10/30/2019 1030   MCV 86.7 02/15/2017 0852   MCH 29.7 02/23/2024 0911   MCHC 34.7 02/23/2024 0911   RDW 13.8 02/23/2024 0911   RDW 12.5 10/30/2019 1030   RDW 12.6 02/15/2017 0852   LYMPHSABS 1.2 02/23/2024 0911   LYMPHSABS 1.4 02/15/2017 0852   MONOABS 0.6 02/23/2024 0911   MONOABS 0.3 02/15/2017 0852   EOSABS 0.3 02/23/2024 0911   EOSABS 0.1 02/15/2017 0852   BASOSABS 0.0 02/23/2024 0911   BASOSABS 0.0 02/15/2017 0852    CMP     Component Value Date/Time   NA 142 02/02/2024 1252   NA 140 10/30/2019 1030   NA 141 02/15/2017 0852   K 3.7 02/02/2024 1252   K 3.9 02/15/2017 0852   CL 109 02/02/2024 1252   CL 101 11/15/2012 0904   CO2 27 02/02/2024  1252   CO2 25 02/15/2017 0852   GLUCOSE 119 (H) 02/02/2024 1252   GLUCOSE 87 02/15/2017 0852   GLUCOSE 69 (L) 11/15/2012 0904   BUN 12 02/02/2024 1252   BUN 11 10/30/2019 1030   BUN 12.5 02/15/2017 0852   CREATININE 0.63 02/02/2024 1252   CREATININE 0.9 02/15/2017 0852   CALCIUM 9.0 02/02/2024 1252   CALCIUM 9.3  02/15/2017 0852   PROT 6.5 02/02/2024 1252   PROT 6.7 10/30/2019 1030   PROT 7.0 02/15/2017 0852   ALBUMIN 4.0 02/02/2024 1252   ALBUMIN 4.2 10/30/2019 1030   ALBUMIN 3.6 02/15/2017 0852   AST 20 02/02/2024 1252   AST 16 02/15/2017 0852   ALT 18 02/02/2024 1252   ALT 13 02/15/2017 0852   ALKPHOS 55 02/02/2024 1252   ALKPHOS 63 02/15/2017 0852   BILITOT 0.3 02/02/2024 1252   BILITOT 0.69 02/15/2017 0852   GFRNONAA >60 02/02/2024 1252   GFRAA 100 10/30/2019 1030     ASSESSMENT and THERAPY PLAN:   Assessment and Plan Assessment & Plan Metastatic breast cancer  HER2-positive metastatic breast cancer with stable sclerotic vertebral metastasis and no new disease on CT scans from 11/2023.  Receiving Enhertu  every 3 weeks.  .  - Labs today stable, proceed with treatment without dose reduction or modification - Order CT scan chest/abd/pelvis for next week.   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 - Continue Eliquis , tolerating well.    RTC in 3 weeks for labs, f/u, and treatment.   All questions were answered. The patient knows to call the clinic with any problems, questions or concerns. We can certainly see the patient much sooner if necessary.  Total encounter time:30 minutes*in face-to-face visit time, chart review, lab review, care coordination, order entry, and documentation of the encounter time.    Morna Kendall, NP 02/23/24 9:37 AM Medical Oncology and Hematology [facility] [address] [place], [place] [address] Tel. [phone]    Fax. [phone]  *Total Encounter Time as defined by the Centers for Medicare and Medicaid Services includes, in addition to the face-to-face time of a patient visit (documented in the note above) non-face-to-face time: obtaining and reviewing outside history, ordering and reviewing  medications, tests or procedures, care coordination (communications with other health care professionals or caregivers) and documentation in the medical record.

## 2024-02-24 ENCOUNTER — Other Ambulatory Visit: Payer: Self-pay

## 2024-02-24 ENCOUNTER — Encounter: Payer: Self-pay | Admitting: Hematology and Oncology

## 2024-03-01 ENCOUNTER — Ambulatory Visit (HOSPITAL_COMMUNITY)
Admission: RE | Admit: 2024-03-01 | Discharge: 2024-03-01 | Disposition: A | Discharge status: Home / Self Care | Source: Ambulatory Visit | Attending: Adult Health | Admitting: Adult Health

## 2024-03-01 DIAGNOSIS — Z170 Estrogen receptor positive status [ER+]: Secondary | ICD-10-CM | POA: Insufficient documentation

## 2024-03-01 DIAGNOSIS — C50919 Malignant neoplasm of unspecified site of unspecified female breast: Secondary | ICD-10-CM | POA: Diagnosis not present

## 2024-03-01 DIAGNOSIS — C50211 Malignant neoplasm of upper-inner quadrant of right female breast: Secondary | ICD-10-CM | POA: Diagnosis not present

## 2024-03-01 DIAGNOSIS — K5730 Diverticulosis of large intestine without perforation or abscess without bleeding: Secondary | ICD-10-CM | POA: Diagnosis not present

## 2024-03-01 DIAGNOSIS — J90 Pleural effusion, not elsewhere classified: Secondary | ICD-10-CM | POA: Diagnosis not present

## 2024-03-01 DIAGNOSIS — K7689 Other specified diseases of liver: Secondary | ICD-10-CM | POA: Diagnosis not present

## 2024-03-01 MED ORDER — HEPARIN SOD (PORK) LOCK FLUSH 100 UNIT/ML IV SOLN
INTRAVENOUS | Status: AC
  Filled 2024-03-01: qty 5

## 2024-03-01 MED ORDER — HEPARIN SOD (PORK) LOCK FLUSH 100 UNIT/ML IV SOLN
500.0000 [IU] | Freq: Once | INTRAVENOUS | Status: AC
Start: 2024-03-01 — End: 2024-03-01
  Administered 2024-03-01: 500 [IU] via INTRAVENOUS

## 2024-03-01 MED ORDER — IOHEXOL 300 MG/ML  SOLN
100.0000 mL | Freq: Once | INTRAMUSCULAR | Status: AC | PRN
  Administered 2024-03-01: 100 mL via INTRAVENOUS

## 2024-03-02 ENCOUNTER — Other Ambulatory Visit: Payer: Self-pay

## 2024-03-14 MED FILL — Fosaprepitant Dimeglumine For IV Infusion 150 MG (Base Eq): INTRAVENOUS | Qty: 5 | Status: AC

## 2024-03-15 ENCOUNTER — Inpatient Hospital Stay

## 2024-03-15 ENCOUNTER — Inpatient Hospital Stay: Attending: Hematology and Oncology | Admitting: Hematology and Oncology

## 2024-03-15 VITALS — BP 137/88 | HR 93 | Temp 98.2°F | Resp 16 | Wt 161.6 lb

## 2024-03-15 DIAGNOSIS — Z803 Family history of malignant neoplasm of breast: Secondary | ICD-10-CM | POA: Insufficient documentation

## 2024-03-15 DIAGNOSIS — Z808 Family history of malignant neoplasm of other organs or systems: Secondary | ICD-10-CM | POA: Insufficient documentation

## 2024-03-15 DIAGNOSIS — Z800 Family history of malignant neoplasm of digestive organs: Secondary | ICD-10-CM | POA: Insufficient documentation

## 2024-03-15 DIAGNOSIS — Z5112 Encounter for antineoplastic immunotherapy: Secondary | ICD-10-CM | POA: Insufficient documentation

## 2024-03-15 DIAGNOSIS — C7951 Secondary malignant neoplasm of bone: Secondary | ICD-10-CM | POA: Diagnosis not present

## 2024-03-15 DIAGNOSIS — Z9221 Personal history of antineoplastic chemotherapy: Secondary | ICD-10-CM | POA: Diagnosis not present

## 2024-03-15 DIAGNOSIS — Z923 Personal history of irradiation: Secondary | ICD-10-CM | POA: Insufficient documentation

## 2024-03-15 DIAGNOSIS — Z79810 Long term (current) use of selective estrogen receptor modulators (SERMs): Secondary | ICD-10-CM | POA: Diagnosis not present

## 2024-03-15 DIAGNOSIS — C50211 Malignant neoplasm of upper-inner quadrant of right female breast: Secondary | ICD-10-CM | POA: Diagnosis not present

## 2024-03-15 DIAGNOSIS — Z9013 Acquired absence of bilateral breasts and nipples: Secondary | ICD-10-CM | POA: Insufficient documentation

## 2024-03-15 DIAGNOSIS — Z86718 Personal history of other venous thrombosis and embolism: Secondary | ICD-10-CM | POA: Diagnosis not present

## 2024-03-15 DIAGNOSIS — Z807 Family history of other malignant neoplasms of lymphoid, hematopoietic and related tissues: Secondary | ICD-10-CM | POA: Insufficient documentation

## 2024-03-15 DIAGNOSIS — Z170 Estrogen receptor positive status [ER+]: Secondary | ICD-10-CM

## 2024-03-15 DIAGNOSIS — Z1731 Human epidermal growth factor receptor 2 positive status: Secondary | ICD-10-CM | POA: Diagnosis not present

## 2024-03-15 DIAGNOSIS — Z1732 Human epidermal growth factor receptor 2 negative status: Secondary | ICD-10-CM | POA: Diagnosis not present

## 2024-03-15 DIAGNOSIS — Z1721 Progesterone receptor positive status: Secondary | ICD-10-CM | POA: Diagnosis not present

## 2024-03-15 DIAGNOSIS — Z7901 Long term (current) use of anticoagulants: Secondary | ICD-10-CM | POA: Insufficient documentation

## 2024-03-15 LAB — CMP (CANCER CENTER ONLY)
ALT: 22 U/L (ref 0–44)
AST: 27 U/L (ref 15–41)
Albumin: 4.1 g/dL (ref 3.5–5.0)
Alkaline Phosphatase: 51 U/L (ref 38–126)
Anion gap: 5 (ref 5–15)
BUN: 14 mg/dL (ref 6–20)
CO2: 30 mmol/L (ref 22–32)
Calcium: 9.5 mg/dL (ref 8.9–10.3)
Chloride: 107 mmol/L (ref 98–111)
Creatinine: 0.75 mg/dL (ref 0.44–1.00)
GFR, Estimated: >60 mL/min (ref >60)
Glucose, Bld: 92 mg/dL (ref 70–99)
Potassium: 3.5 mmol/L (ref 3.5–5.1)
Sodium: 142 mmol/L (ref 135–145)
Total Bilirubin: 0.3 mg/dL (ref 0.0–1.2)
Total Protein: 7.1 g/dL (ref 6.5–8.1)

## 2024-03-15 LAB — CBC WITH DIFFERENTIAL (CANCER CENTER ONLY)
Abs Immature Granulocytes: 0.01 K/uL (ref 0.00–0.07)
Basophils Absolute: 0 K/uL (ref 0.0–0.1)
Basophils Relative: 0 %
Eosinophils Absolute: 0.2 K/uL (ref 0.0–0.5)
Eosinophils Relative: 4 %
HCT: 39.4 % (ref 36.0–46.0)
Hemoglobin: 13.8 g/dL (ref 12.0–15.0)
Immature Granulocytes: 0 %
Lymphocytes Relative: 25 %
Lymphs Abs: 1.4 K/uL (ref 0.7–4.0)
MCH: 29.9 pg (ref 26.0–34.0)
MCHC: 35 g/dL (ref 30.0–36.0)
MCV: 85.5 fL (ref 80.0–100.0)
Monocytes Absolute: 0.6 K/uL (ref 0.1–1.0)
Monocytes Relative: 12 %
Neutro Abs: 3.3 K/uL (ref 1.7–7.7)
Neutrophils Relative %: 59 %
Platelet Count: 316 K/uL (ref 150–400)
RBC: 4.61 MIL/uL (ref 3.87–5.11)
RDW: 14.1 % (ref 11.5–15.5)
WBC Count: 5.5 K/uL (ref 4.0–10.5)
nRBC: 0 % (ref 0.0–0.2)

## 2024-03-15 MED ORDER — SODIUM CHLORIDE 0.9% FLUSH: 10.0000 mL | INTRAVENOUS | Status: DC | PRN

## 2024-03-15 MED ORDER — FAM-TRASTUZUMAB DERUXTECAN-NXKI CHEMO 100 MG IV SOLR
5.4000 mg/kg | Freq: Once | INTRAVENOUS | Status: AC
  Administered 2024-03-15: 340 mg via INTRAVENOUS
  Filled 2024-03-15: qty 17

## 2024-03-15 MED ORDER — ACETAMINOPHEN 325 MG PO TABS
650.0000 mg | ORAL_TABLET | Freq: Once | ORAL | Status: AC
  Administered 2024-03-15: 650 mg via ORAL
  Filled 2024-03-15: qty 2

## 2024-03-15 MED ORDER — PALONOSETRON HCL INJECTION 0.25 MG/5ML
0.2500 mg | Freq: Once | INTRAVENOUS | Status: AC
  Administered 2024-03-15: 0.25 mg via INTRAVENOUS
  Filled 2024-03-15: qty 5

## 2024-03-15 MED ORDER — DIPHENHYDRAMINE HCL 25 MG PO CAPS
25.0000 mg | ORAL_CAPSULE | Freq: Once | ORAL | Status: AC
  Administered 2024-03-15: 25 mg via ORAL
  Filled 2024-03-15: qty 1

## 2024-03-15 MED ORDER — DEXAMETHASONE SODIUM PHOSPHATE 10 MG/ML IJ SOLN
10.0000 mg | Freq: Once | INTRAMUSCULAR | Status: AC
  Administered 2024-03-15: 10 mg via INTRAVENOUS
  Filled 2024-03-15: qty 1

## 2024-03-15 MED ORDER — OLANZAPINE 5 MG PO TABS
5.0000 mg | ORAL_TABLET | Freq: Once | ORAL | Status: AC
  Administered 2024-03-15: 5 mg via ORAL
  Filled 2024-03-15: qty 1

## 2024-03-15 MED ORDER — SODIUM CHLORIDE 0.9 % IV SOLN
150.0000 mg | Freq: Once | INTRAVENOUS | Status: AC
  Administered 2024-03-15: 150 mg via INTRAVENOUS
  Filled 2024-03-15: qty 150

## 2024-03-15 MED ORDER — DEXTROSE 5 % IV SOLN: INTRAVENOUS | Status: DC

## 2024-03-15 NOTE — Progress Notes (Signed)
 [facility] Cancer Follow up:    Sherry Artist PARAS, MD [address] [place] [address]   DIAGNOSIS:  Cancer Staging  Malignant neoplasm of upper-inner quadrant of right breast in female, estrogen receptor positive (HCC) Staging form: Breast, AJCC 7th Edition - Pathologic stage from 07/14/2012: Stage IV (TX, NX, M1) - Signed by [name], MD on 08/28/2021 Specimen type: Core Needle Biopsy Histopathologic type: 9931 Laterality: Right    SUMMARY OF ONCOLOGIC HISTORY: Oncology History  Malignant neoplasm of upper-inner quadrant of right breast in female, estrogen receptor positive (HCC)  07/13/2012 Clinical Stage   Stage IA: T1c N0   07/14/2012 Definitive Surgery   Bilateral mastectomy/right SLNB: RIGHT invasive ductal carcinoma, grade 3, ER+, PR +, Her2/neu positive (ratio 3.02), Ki67 48%. DCIS. 1/4 LN positive for malignancy. LEFT: benign   07/14/2012 Pathologic Stage   Stage IIB: T2 N1a M0   07/14/2012 Cancer Staging   Staging form: Breast, AJCC 7th Edition - Pathologic stage from 07/14/2012: Stage IV (TX, NX, M1) - Signed by [name], MD on 08/28/2021 Specimen type: Core Needle Biopsy Histopathologic type: 9931 Laterality: Right   08/17/2012 - 08/30/2013 Chemotherapy   Adjuvant carboplatin , docetaxel , and trastuzumab  x 6 cycles (completed 11/30/2012) followed by maintenance trastuzumab  to total one year   11/2012 Procedure   Comp Cancer Gene panel (GeneDx) negative for deleterious mutations    - 02/2013 Radiation Therapy   Adjuvant RT to right breast   02/2013 -  Anti-estrogen oral therapy   Tamoxifen  20 mg daily   09/24/2013 Surgery   Bilateral breast reconstruction with latissmus flap and expander placement   12/12/2013 Surgery   Implant placement    Relapse/Recurrence   Left sided malignant pleural effusion.  Tumor cells are positive for GATA3 and ER, negative for TTF-1 consistent with recurrent breast carcinoma Prognostic showed ER 90%  positive strong staining, PR 80% positive strong staining, HER2 negative.    09/16/2021 Imaging   PET scan showed malignant left pleural effusion with extensive areas of nodularity and hypermetabolic activity involving the mediastinal border and pericardium as well as the most inferior aspects of the costodiaphragmatic recess.  Signs of nodal disease at the thoracic inlet on the left suspect extension of disease below the diaphragm along the left anterolateral aorta.  Nonspecific moderate to markedly increased metabolic activity about the base of the tongue bilaterally.  Consider direct visualization showing mildly asymmetric uptake favoring the right lingual tonsil.  Sclerotic foci without marked increased metabolic activity suspicious for metastatic disease perhaps treated or questions.  Heterogeneous marrow uptake seen elsewhere generalized and nonspecific.  Consider spinal MRI is warranted  MRI brain without any evidence of intracranial metastatic disease.   10/09/2021 - 02/11/2022 Chemotherapy   Taxotere , Herceptin , Perjeta  x 6   12/04/2021 Imaging   There is no evidence new metastatic disease. There is interval decrease in the left pleural effusion possibly suggesting resolving pleural metastatic disease. Few scattered sclerotic metastatic lesions in the skeletal structures have not changed significantly.   No acute findings are seen in the chest abdomen and pelvis.     02/22/2022 -  Antibody Plan   Herceptin /Perjeta  every 3 weeks   03/21/2022 Imaging   CT chest abdomen pelvis  IMPRESSION: 1. No significant change since previous study. Again demonstrated is a chronic small left pleural effusion with basilar atelectasis and scattered sclerotic skeletal metastases. 2. No acute abnormalities are demonstrated. 3. Small esophageal hiatal hernia.     Electronically Signed  By: [name] M.D.   On: 03/21/2022 20:23   07/22/2023 -  Chemotherapy   Patient is on Treatment Plan :  BREAST Fam-Trastuzumab Deruxtecan-nxki  (Enhertu ) (5.4) q21d       CURRENT THERAPY: Enhertu   INTERVAL HISTORY:  Discussed the use of AI scribe software for clinical note transcription with the patient, who gave verbal consent to proceed.  History of Present Illness   Sherry Barry is a 53 year old female with breast cancer who presents for follow-up regarding her treatment.  She experiences fluctuating fatigue, which she attributes to her treatment schedule. No new symptoms have emerged since her last visit. No bowel or urinary issues, with normal bowel movements and no difficulty urinating.  She recently had imaging studies, and the results were discussed with her. She has a history of blood clot and continues on anticoagulation therapy as part of her treatment regimen. She is currently receiving Enhertu  as part of her treatment, and her recent blood counts were reported to her as normal.  She recently underwent an echocardiogram and Dr Rolan discussed the results with her.  Rest of the pertinent 10 point ROS reviewed and neg.   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  MEDICAL HISTORY: Past Medical History:  Diagnosis Date   Allergy    Breast cancer  (HCC)    Breast cancer (HCC)    right   History of chemotherapy    doxetaxel/carboplatin /trastuzumab    History of migraine    last one about a week ago   Hx of radiation therapy 01/01/13- 02/15/13   r chest wall, r supraclav/axilla 5040 cGy/28 sessions, scar boost 1000 cGy/5 sessions   Hypertension    no meds,   urgent care on pomana   Migraine    Migraine     SURGICAL HISTORY: Past Surgical History:  Procedure Laterality Date   ABDOMINAL HYSTERECTOMY     no salpingo-oophorectomy 2009   BREAST RECONSTRUCTION WITH PLACEMENT OF TISSUE EXPANDER AND FLEX HD (ACELLULAR HYDRATED DERMIS) Left 09/24/2013   IR IMAGING GUIDED PORT INSERTION  10/07/2021   IR THORACENTESIS ASP PLEURAL SPACE W/IMG GUIDE  08/11/2021   IR THORACENTESIS ASP PLEURAL SPACE W/IMG GUIDE  10/07/2021   LATISSIMUS FLAP TO BREAST Right 09/24/2013   Procedure: RIGHT LATISSMUS MYOCUTAEIOUS MUSCLE FLAP AND PLACEMENT OF TISSUE LESLEIGH;  Surgeon: Estefana Reichert, DO;  Location: [facility];  Service: Plastics;  Laterality: Right;   LIPOSUCTION WITH LIPOFILLING Bilateral 12/12/2013   Procedure: LIPOSUCTION WITH LIPOFILLING;  Surgeon: Estefana Reichert, DO;  Location: [facility];  Service: Plastics;  Laterality: Bilateral;   PORT-A-CATH REMOVAL Left 12/12/2013   Procedure: REMOVAL PORT-A-CATH;  Surgeon: Estefana Reichert, DO;  Location: [facility];  Service: Plastics;  Laterality: Left;   PORTACATH PLACEMENT  07/14/2012   Procedure: INSERTION PORT-A-CATH;  Surgeon: Debby LABOR. Cornett, MD;  Location: [facility];  Service: General;  Laterality: Left;   RECONSTRUCTION BREAST W/ LATISSIMUS DORSI FLAP Right 09/24/2013   & tissue expander placement   REMOVAL OF BILATERAL TISSUE EXPANDERS WITH PLACEMENT OF BILATERAL BREAST IMPLANTS Bilateral 12/12/2013   Procedure: REMOVAL OF BILATERAL TISSUE EXPANDERS WITH PLACEMENT OF BILATERAL BREAST IMPLANTS/BILATERAL CAPSULECTOMIES WITH  LIPOFILLING FAT GRAFTING;  Surgeon: Estefana Reichert, DO;   Location: [facility];  Service: Plastics;  Laterality: Bilateral;   SIMPLE MASTECTOMY WITH AXILLARY SENTINEL NODE BIOPSY  07/14/2012   Procedure: SIMPLE MASTECTOMY WITH AXILLARY SENTINEL NODE BIOPSY;  Surgeon: Debby LABOR. Cornett, MD;  Location: [facility];  Service: General;  Laterality: Right;  Bilateral simple mastectomy with port and right sebtibel lymph node mapping   SIMPLE MASTECTOMY WITH AXILLARY SENTINEL NODE BIOPSY  07/14/2012   Procedure: SIMPLE MASTECTOMY;  Surgeon: Debby LABOR. Cornett, MD;  Location: [facility];  Service: General;  Laterality: Left;   TISSUE EXPANDER PLACEMENT Left 09/24/2013   Procedure: PLACEMENT OF TISSUE EXPANDER AND FLEX HD TO LEFT BREAST;  Surgeon: Estefana Reichert, DO;  Location: [facility];  Service: Plastics;  Laterality: Left;    SOCIAL HISTORY: Social History   Socioeconomic History   Marital status: Married    Spouse name: Not on file   Number of children: 2   Years of education: Not on file   Highest education level: Not on file  Occupational History    Employer: [employer]  Tobacco Use   Smoking status: Never   Smokeless tobacco: Never  Substance and Sexual Activity   Alcohol use: Yes    Comment: occasional   Drug use: No   Sexual activity: Yes    Birth control/protection: Surgical  Other Topics Concern   Not on file  Social History Narrative   Not on file   Social Drivers of Health   Financial Resource Strain: Not on file  Food Insecurity: No Food Insecurity (12/11/2023)   Hunger Vital Sign    Worried About Running Out of Food in the Last Year: Never true    Ran Out of Food in the Last Year: Never true  Transportation Needs: No Transportation Needs (12/11/2023)   PRAPARE - Administrator, Civil Service (Medical): No    Lack of Transportation (Non-Medical): No  Physical Activity: Not on file  Stress: Not on file  Social Connections: Not on file  Intimate Partner Violence: Not At Risk (12/11/2023)   Humiliation, Afraid,  Rape, and Kick questionnaire    Fear of Current or Ex-Partner: No    Emotionally Abused: No    Physically Abused: No    Sexually Abused: No    FAMILY HISTORY: Family History  Problem Relation Age of Onset   Hypertension Mother    Alcohol abuse Mother    Heart disease Maternal Grandmother    Stroke Maternal Grandfather    Colon cancer Maternal Aunt 55       alive at 74   Brain cancer Maternal Uncle 78       and lymphoma in early 43s; deceased   Brain cancer Maternal Uncle 60       deceased   Pancreatic cancer Maternal Uncle 45       alive at 83   Breast cancer Cousin 7       mat 1st cousin once removed through mat GF ; deceased   Breast cancer Maternal Aunt        great aunt through mat GF; dx at ?  age    Review of Systems  Constitutional:  Negative for appetite change, chills, fatigue, fever and unexpected weight change.  HENT:   Negative for hearing loss, lump/mass and trouble swallowing.   Eyes:  Negative for eye problems and icterus.  Respiratory:  Negative for chest tightness, cough and shortness of breath.   Cardiovascular:  Negative for chest pain, leg swelling and palpitations.  Gastrointestinal:  Negative for abdominal distention, abdominal pain, constipation, diarrhea, nausea and vomiting.  Endocrine: Negative for hot flashes.  Genitourinary:  Negative for difficulty urinating.   Musculoskeletal:  Negative for arthralgias.  Skin:  Negative for itching and rash.  Neurological:  Negative for dizziness, extremity weakness, headaches and numbness.  Hematological:  Negative for adenopathy. Does not bruise/bleed easily.  Psychiatric/Behavioral:  Negative for depression. The patient is not nervous/anxious.       PHYSICAL EXAMINATION      Vitals:   03/15/24 1243  BP: 137/88  Pulse: 93  Resp: 16  Temp: 98.2 F (36.8 C)  SpO2: 100%    Physical Exam Constitutional:      General: She is not in acute distress.    Appearance: Normal appearance. She is not  toxic-appearing.  HENT:     Head: Normocephalic and atraumatic.     Mouth/Throat:     Mouth: Mucous membranes are moist.     Pharynx: Oropharynx is clear. No oropharyngeal exudate or posterior oropharyngeal erythema.  Eyes:     General: No scleral icterus. Cardiovascular:     Rate and Rhythm: Normal rate and regular rhythm.     Pulses: Normal pulses.     Heart sounds: Normal heart sounds.  Pulmonary:     Effort: Pulmonary effort is normal.     Breath sounds: Normal breath sounds.  Abdominal:     General: Abdomen is flat. Bowel sounds are normal. There is no distension.     Palpations: Abdomen is soft.     Tenderness: There is no abdominal tenderness.  Musculoskeletal:        General: No swelling.     Cervical back: Neck supple.  Lymphadenopathy:     Cervical: No cervical adenopathy.  Skin:    General: Skin is warm and dry.     Findings: No rash.  Neurological:     General: No focal deficit present.     Mental Status: She is alert.  Psychiatric:        Mood and Affect: Mood normal.        Behavior: Behavior normal.     LABORATORY DATA:  CBC    Component Value Date/Time   WBC 5.5 03/15/2024 1142   WBC 11.1 (H) 12/12/2023 0353   RBC 4.61 03/15/2024 1142   HGB 13.8 03/15/2024 1142   HGB 15.9 10/30/2019 1030   HGB 14.7 02/15/2017 0852   HCT 39.4 03/15/2024 1142   HCT 46.1 10/30/2019 1030   HCT 42.9 02/15/2017 0852   PLT 316 03/15/2024 1142   PLT 245 10/30/2019 1030   MCV 85.5 03/15/2024 1142   MCV 88 10/30/2019 1030   MCV 86.7 02/15/2017 0852   MCH 29.9 03/15/2024 1142   MCHC 35.0 03/15/2024 1142   RDW 14.1 03/15/2024 1142   RDW 12.5 10/30/2019 1030   RDW 12.6 02/15/2017 0852   LYMPHSABS 1.4 03/15/2024 1142   LYMPHSABS 1.4 02/15/2017 0852   MONOABS 0.6 03/15/2024 1142   MONOABS 0.3 02/15/2017 0852   EOSABS 0.2 03/15/2024 1142   EOSABS 0.1 02/15/2017 0852   BASOSABS  0.0 03/15/2024 1142   BASOSABS 0.0 02/15/2017 0852    CMP     Component Value  Date/Time   NA 142 03/15/2024 1142   NA 140 10/30/2019 1030   NA 141 02/15/2017 0852   K 3.5 03/15/2024 1142   K 3.9 02/15/2017 0852   CL 107 03/15/2024 1142   CL 101 11/15/2012 0904   CO2 30 03/15/2024 1142   CO2 25 02/15/2017 0852   GLUCOSE 92 03/15/2024 1142   GLUCOSE 87 02/15/2017 0852   GLUCOSE 69 (L) 11/15/2012 0904   BUN 14 03/15/2024 1142   BUN 11 10/30/2019 1030   BUN 12.5 02/15/2017 0852   CREATININE 0.75 03/15/2024 1142   CREATININE 0.9 02/15/2017 0852   CALCIUM 9.5 03/15/2024 1142   CALCIUM 9.3 02/15/2017 0852   PROT 7.1 03/15/2024 1142   PROT 6.7 10/30/2019 1030   PROT 7.0 02/15/2017 0852   ALBUMIN 4.1 03/15/2024 1142   ALBUMIN 4.2 10/30/2019 1030   ALBUMIN 3.6 02/15/2017 0852   AST 27 03/15/2024 1142   AST 16 02/15/2017 0852   ALT 22 03/15/2024 1142   ALT 13 02/15/2017 0852   ALKPHOS 51 03/15/2024 1142   ALKPHOS 63 02/15/2017 0852   BILITOT 0.3 03/15/2024 1142   BILITOT 0.69 02/15/2017 0852   GFRNONAA >60 03/15/2024 1142   GFRAA 100 10/30/2019 1030     ASSESSMENT and THERAPY PLAN:   Assessment and Plan Assessment & Plan  Metastatic breast cancer Enhertu  effective per scans. Normal blood counts indicate good tolerance. Echocardiogram confirmed stable cardiac function. - Continue Enhertu  treatment. - Schedule additional treatments.  Cancer treatment-related fatigue Fatigue fluctuates with treatment cycle. Continue monitoring  Venous thromboembolism Continue anticoagulation indefinitely  Breast implant status No complications or deformities. Advised against elective surgery during active cancer treatment. - Monitor breast implants for any changes or complications.  All questions were answered. The patient knows to call the clinic with any problems, questions or concerns. We can certainly see the patient much sooner if necessary.  Total encounter time:30 minutes*in face-to-face visit time, chart review, lab review, care coordination, order  entry, and documentation of the encounter time.  *Total Encounter Time as defined by the Centers for Medicare and Medicaid Services includes, in addition to the face-to-face time of a patient visit (documented in the note above) non-face-to-face time: obtaining and reviewing outside history, ordering and reviewing medications, tests or procedures, care coordination (communications with other health care professionals or caregivers) and documentation in the medical record.

## 2024-03-15 NOTE — Patient Instructions (Signed)

## 2024-03-23 ENCOUNTER — Other Ambulatory Visit: Payer: Self-pay

## 2024-03-27 ENCOUNTER — Other Ambulatory Visit: Payer: Self-pay | Admitting: Hematology and Oncology

## 2024-04-04 ENCOUNTER — Other Ambulatory Visit: Payer: Self-pay

## 2024-04-04 ENCOUNTER — Other Ambulatory Visit

## 2024-04-04 ENCOUNTER — Ambulatory Visit: Admitting: Hematology and Oncology

## 2024-04-04 ENCOUNTER — Ambulatory Visit

## 2024-04-04 MED FILL — Fosaprepitant Dimeglumine For IV Infusion 150 MG (Base Eq): INTRAVENOUS | Qty: 5 | Status: AC

## 2024-04-04 NOTE — Progress Notes (Unsigned)
 [facility] OFFICE PROGRESS NOTE  Sherry Barry PARAS, MD [address] [place] [address]  DIAGNOSIS:  Oncology History  Malignant neoplasm of upper-inner quadrant of right breast in female, estrogen receptor positive (HCC)  07/13/2012 Clinical Stage   Stage IA: T1c N0   07/14/2012 Definitive Surgery   Bilateral mastectomy/right SLNB: RIGHT invasive ductal carcinoma, grade 3, ER+, PR +, Her2/neu positive (ratio 3.02), Ki67 48%. DCIS. 1/4 LN positive for malignancy. LEFT: benign   07/14/2012 Pathologic Stage   Stage IIB: T2 N1a M0   07/14/2012 Cancer Staging   Staging form: Breast, AJCC 7th Edition - Pathologic stage from 07/14/2012: Stage IV (TX, NX, M1) - Signed by [name], MD on 08/28/2021 Specimen type: Core Needle Biopsy Histopathologic type: 9931 Laterality: Right   08/17/2012 - 08/30/2013 Chemotherapy   Adjuvant carboplatin , docetaxel , and trastuzumab  x 6 cycles (completed 11/30/2012) followed by maintenance trastuzumab  to total one year   11/2012 Procedure   Comp Cancer Gene panel (GeneDx) negative for deleterious mutations    - 02/2013 Radiation Therapy   Adjuvant RT to right breast   02/2013 -  Anti-estrogen oral therapy   Tamoxifen  20 mg daily   09/24/2013 Surgery   Bilateral breast reconstruction with latissmus flap and expander placement   12/12/2013 Surgery   Implant placement    Relapse/Recurrence   Left sided malignant pleural effusion.  Tumor cells are positive for GATA3 and ER, negative for TTF-1 consistent with recurrent breast carcinoma Prognostic showed ER 90% positive strong staining, PR 80% positive strong staining, HER2 negative.    09/16/2021 Imaging   PET scan showed malignant left pleural effusion with extensive areas of nodularity and hypermetabolic activity involving the mediastinal border and pericardium as well as the most inferior aspects of the costodiaphragmatic recess.  Signs of nodal disease at the thoracic inlet on the left  suspect extension of disease below the diaphragm along the left anterolateral aorta.  Nonspecific moderate to markedly increased metabolic activity about the base of the tongue bilaterally.  Consider direct visualization showing mildly asymmetric uptake favoring the right lingual tonsil.  Sclerotic foci without marked increased metabolic activity suspicious for metastatic disease perhaps treated or questions.  Heterogeneous marrow uptake seen elsewhere generalized and nonspecific.  Consider spinal MRI is warranted  MRI brain without any evidence of intracranial metastatic disease.   10/09/2021 - 02/11/2022 Chemotherapy   Taxotere , Herceptin , Perjeta  x 6   12/04/2021 Imaging   There is no evidence new metastatic disease. There is interval decrease in the left pleural effusion possibly suggesting resolving pleural metastatic disease. Few scattered sclerotic metastatic lesions in the skeletal structures have not changed significantly.   No acute findings are seen in the chest abdomen and pelvis.     02/22/2022 -  Antibody Plan   Herceptin /Perjeta  every 3 weeks   03/21/2022 Imaging   CT chest abdomen pelvis  IMPRESSION: 1. No significant change since previous study. Again demonstrated is a chronic small left pleural effusion with basilar atelectasis and scattered sclerotic skeletal metastases. 2. No acute abnormalities are demonstrated. 3. Small esophageal hiatal hernia.     Electronically Signed   By: [name] M.D.   On: 03/21/2022 20:23   07/22/2023 -  Chemotherapy   Patient is on Treatment Plan : BREAST Fam-Trastuzumab Deruxtecan-nxki  (Enhertu ) (5.4) q21d       CURRENT THERAPY: Enhertu , she is here for cycle #13 today.   INTERVAL HISTORY: Sherry Barry 53 y.o. female returns to the clinic today for  a follow-up visit.  The patient was last seen 3 weeks ago by Dr. Loretha.  The patient's follow-up for breast cancer.  She is currently on Enhertu  and is tolerating it  fairly well. She denies any new symptoms in the interval except she had a mild cold last weekend with nasal congestion. She is improving at this time. She confirmed she was flu and Covid negative. Does have some fluctuating energy levels which she states is stable.  She denies any recent fever, chills, or unexplained weight loss.  She denies any rashes or skin changes.  She denies any chest pain, shortness of breath, cough, or hemoptysis.  Denies any nausea, vomiting, diarrhea, or constipation.  She denies any unusual headaches or vision changes.  She continues on anticoagulation therapy due to her history of blood clot.  She is up-to-date on her recent echocardiogram and follows closely with Dr. Rolan. She was on blood pressure medication but it dropped her BP too low. Therefore, it was discontinued. She is here for evaluation and repeat blood work before undergoing cycle 12.      MEDICAL HISTORY: Past Medical History:  Diagnosis Date   Allergy    Breast cancer (HCC)    Breast cancer (HCC)    right   History of chemotherapy    doxetaxel/carboplatin /trastuzumab    History of migraine    last one about a week ago   Hx of radiation therapy 01/01/13- 02/15/13   r chest wall, r supraclav/axilla 5040 cGy/28 sessions, scar boost 1000 cGy/5 sessions   Hypertension    no meds,   urgent care on pomana   Migraine    Migraine     ALLERGIES:  is allergic to codeine, hydrocodone , latex, lisinopril , and tomato.  MEDICATIONS:  Current Outpatient Medications  Medication Sig Dispense Refill   azithromycin  (ZITHROMAX ) 250 MG tablet Take as directed 6 each 0   acetaminophen  (TYLENOL ) 500 MG tablet Take 1,000 mg by mouth every 6 (six) hours as needed for moderate pain.     apixaban  (ELIQUIS ) 5 MG TABS tablet Take 1 tablet (5 mg total) by mouth 2 (two) times daily. 60 tablet 6   carvedilol  (COREG ) 3.125 MG tablet TAKE 1 TABLET BY MOUTH TWICE A DAY WITH FOOD 180 tablet 1   cholecalciferol  (VITAMIN D3) 25 MCG  (1000 UNIT) tablet Take 1,000 Units by mouth daily.     dexamethasone  (DECADRON ) 4 MG tablet Take 2 tablets (8 mg) by mouth daily for 3 days starting the day after chemotherapy. Take with food. 30 tablet 1   lidocaine -prilocaine  (EMLA ) cream Apply 1 Application topically as needed. 30 g 2   MAGNESIUM PO Take 1 tablet by mouth every evening.     OLANZapine  (ZYPREXA ) 2.5 MG tablet TAKE 1 TABLET BY MOUTH EVERYDAY AT BEDTIME 90 tablet 1   ondansetron  (ZOFRAN ) 8 MG tablet Take 1 tablet (8 mg total) by mouth every 8 (eight) hours as needed for nausea or vomiting. Start on the third day after chemotherapy. 30 tablet 1   polyethylene glycol powder (GLYCOLAX /MIRALAX ) 17 GM/SCOOP powder Take 17 grams dissolved in liquid by mouth daily. 238 g 0   potassium chloride  SA (KLOR-CON  M20) 20 MEQ tablet Take 1 tablet (20 mEq total) by mouth 2 (two) times daily. 60 tablet 3   prochlorperazine  (COMPAZINE ) 10 MG tablet Take 1 tablet (10 mg total) by mouth every 6 (six) hours as needed for nausea or vomiting. 30 tablet 1   No current facility-administered medications for this visit.  Facility-Administered Medications Ordered in Other Visits  Medication Dose Route Frequency Provider Last Rate Last Admin   acetaminophen  (TYLENOL ) 325 MG tablet            dextrose  5 % solution   Intravenous Continuous Iruku, Praveena, MD 10 mL/hr at 04/05/24 0849 New Bag at 04/05/24 0849   diphenhydrAMINE  (BENADRYL ) 25 mg capsule            fam-trastuzumab deruxtecan-nxki  (ENHERTU ) 340 mg in dextrose  5 % 100 mL chemo infusion  5.4 mg/kg (Treatment Plan Recorded) Intravenous Once Iruku, Praveena, MD       fosaprepitant  (EMEND) 150 mg in sodium chloride  0.9 % 145 mL IVPB  150 mg Intravenous Once Iruku, Praveena, MD       Zoledronic  Acid (ZOMETA ) IVPB 4 mg  4 mg Intravenous Once Iruku, Praveena, MD        SURGICAL HISTORY:  Past Surgical History:  Procedure Laterality Date   ABDOMINAL HYSTERECTOMY     no salpingo-oophorectomy 2009    BREAST RECONSTRUCTION WITH PLACEMENT OF TISSUE EXPANDER AND FLEX HD (ACELLULAR HYDRATED DERMIS) Left 09/24/2013   IR IMAGING GUIDED PORT INSERTION  10/07/2021   IR THORACENTESIS ASP PLEURAL SPACE W/IMG GUIDE  08/11/2021   IR THORACENTESIS ASP PLEURAL SPACE W/IMG GUIDE  10/07/2021   LATISSIMUS FLAP TO BREAST Right 09/24/2013   Procedure: RIGHT LATISSMUS MYOCUTAEIOUS MUSCLE FLAP AND PLACEMENT OF TISSUE LESLEIGH;  Surgeon: Estefana Reichert, DO;  Location: [facility];  Service: Plastics;  Laterality: Right;   LIPOSUCTION WITH LIPOFILLING Bilateral 12/12/2013   Procedure: LIPOSUCTION WITH LIPOFILLING;  Surgeon: Estefana Reichert, DO;  Location: [facility];  Service: Plastics;  Laterality: Bilateral;   PORT-A-CATH REMOVAL Left 12/12/2013   Procedure: REMOVAL PORT-A-CATH;  Surgeon: Estefana Reichert, DO;  Location: [facility];  Service: Plastics;  Laterality: Left;   PORTACATH PLACEMENT  07/14/2012   Procedure: INSERTION PORT-A-CATH;  Surgeon: Debby LABOR. Cornett, MD;  Location: [facility];  Service: General;  Laterality: Left;   RECONSTRUCTION BREAST W/ LATISSIMUS DORSI FLAP Right 09/24/2013   & tissue expander placement   REMOVAL OF BILATERAL TISSUE EXPANDERS WITH PLACEMENT OF BILATERAL BREAST IMPLANTS Bilateral 12/12/2013   Procedure: REMOVAL OF BILATERAL TISSUE EXPANDERS WITH PLACEMENT OF BILATERAL BREAST IMPLANTS/BILATERAL CAPSULECTOMIES WITH  LIPOFILLING FAT GRAFTING;  Surgeon: Estefana Reichert, DO;  Location: [facility];  Service: Plastics;  Laterality: Bilateral;   SIMPLE MASTECTOMY WITH AXILLARY SENTINEL NODE BIOPSY  07/14/2012   Procedure: SIMPLE MASTECTOMY WITH AXILLARY SENTINEL NODE BIOPSY;  Surgeon: Debby LABOR. Cornett, MD;  Location: [facility];  Service: General;  Laterality: Right;  Bilateral simple mastectomy with port and right sebtibel lymph node mapping   SIMPLE MASTECTOMY WITH AXILLARY SENTINEL NODE BIOPSY  07/14/2012   Procedure: SIMPLE MASTECTOMY;  Surgeon: Debby LABOR. Cornett, MD;   Location: [facility];  Service: General;  Laterality: Left;   TISSUE EXPANDER PLACEMENT Left 09/24/2013   Procedure: PLACEMENT OF TISSUE EXPANDER AND FLEX HD TO LEFT BREAST;  Surgeon: Estefana Reichert, DO;  Location: [facility];  Service: Plastics;  Laterality: Left;    REVIEW OF SYSTEMS:   Review of Systems  Constitutional: Stable fatigue. Negative for appetite change, chills, fever and unexpected weight change.  HENT: Improving nasal congestion. Negative for mouth sores, nosebleeds, sore throat and trouble swallowing.   Eyes: Negative for eye problems and icterus.  Respiratory: Negative for cough, hemoptysis, shortness of breath and wheezing.   Cardiovascular: Negative for chest pain and leg swelling.  Gastrointestinal: Negative for  abdominal pain, constipation, diarrhea, nausea and vomiting.  Genitourinary: Negative for bladder incontinence, difficulty urinating, dysuria, frequency and hematuria.   Musculoskeletal: Negative for back pain, gait problem, neck pain and neck stiffness.  Skin: Negative for itching and rash.  Neurological: Negative for dizziness, extremity weakness, gait problem, headaches, light-headedness and seizures.  Hematological: Negative for adenopathy. Does not bruise/bleed easily.  Psychiatric/Behavioral: Negative for confusion, depression and sleep disturbance. The patient is not nervous/anxious.     PHYSICAL EXAMINATION:  Blood pressure (!) 140/102, pulse 91, temperature (!) 97.2 F (36.2 C), temperature source Temporal, resp. rate 17, height 5' 2.5 (1.588 m), weight 166 lb (75.3 kg), SpO2 99%.  ECOG PERFORMANCE STATUS: 1  Physical Exam  Constitutional: Oriented to person, place, and time and well-developed, well-nourished, and in no distress. HENT:  Head: Normocephalic and atraumatic.  Mouth/Throat: Oropharynx is clear and moist. No oropharyngeal exudate.  Eyes: Conjunctivae are normal. Right eye exhibits no discharge. Left eye exhibits no discharge. No scleral icterus.   Neck: Normal range of motion. Neck supple.  Cardiovascular: Normal rate, regular rhythm, normal heart sounds and intact distal pulses.   Pulmonary/Chest: Effort normal and breath sounds normal. No respiratory distress. No wheezes. No rales.  Abdominal: Soft. Bowel sounds are normal. Exhibits no distension and no mass. There is no tenderness.  Musculoskeletal: Normal range of motion. Exhibits no edema.  Lymphadenopathy:    No cervical adenopathy.  Neurological: Alert and oriented to person, place, and time. Exhibits normal muscle tone. Gait normal. Coordination normal.  Skin: Skin is warm and dry. No rash noted. Not diaphoretic. No erythema. No pallor.  Psychiatric: Mood, memory and judgment normal.  Vitals reviewed.  LABORATORY DATA: Lab Results  Component Value Date   WBC 3.7 (L) 04/05/2024   HGB 12.7 04/05/2024   HCT 37.2 04/05/2024   MCV 85.5 04/05/2024   PLT 268 04/05/2024      Chemistry      Component Value Date/Time   NA 143 04/05/2024 0733   NA 140 10/30/2019 1030   NA 141 02/15/2017 0852   K 3.6 04/05/2024 0733   K 3.9 02/15/2017 0852   CL 109 04/05/2024 0733   CL 101 11/15/2012 0904   CO2 28 04/05/2024 0733   CO2 25 02/15/2017 0852   BUN 12 04/05/2024 0733   BUN 11 10/30/2019 1030   BUN 12.5 02/15/2017 0852   CREATININE 0.71 04/05/2024 0733   CREATININE 0.9 02/15/2017 0852      Component Value Date/Time   CALCIUM 9.0 04/05/2024 0733   CALCIUM 9.3 02/15/2017 0852   ALKPHOS 49 04/05/2024 0733   ALKPHOS 63 02/15/2017 0852   AST 19 04/05/2024 0733   AST 16 02/15/2017 0852   ALT 11 04/05/2024 0733   ALT 13 02/15/2017 0852   BILITOT 0.3 04/05/2024 0733   BILITOT 0.69 02/15/2017 0852       RADIOGRAPHIC STUDIES:  No results found.   ASSESSMENT/PLAN:  This a very pleasant 53 year old African-American female with metastatic breast cancer.  She is currently undergoing treatment with Enhertu .  Labs were reviewed.  Recommend that she proceed with cycle  number 13  See her back in 3 weeks before undergoing the next cycle of treatment.  She will continue indefinite anticoagulation due to her history of venous thromboembolism.  She had a cold last week with nasal congestion. She is improving but has some congestion. I will send her a zpak to her pharmacy. I have refilled her EMLA  cream.   Her BP was  140/102. She was on blood pressure medication but she's states it caused BP fluctuations and was discontinued. She is taking coreg . She is asymptomatic. She will keep a log of her BP readings at home and share it with her cardiologist if her BP is not at goal for medication management suggestions.   The patient was advised to call immediately if she has any concerning symptoms in the interval. The patient voices understanding of current disease status and treatment options and is in agreement with the current care plan. All questions were answered. The patient knows to call the clinic with any problems, questions or concerns. We can certainly see the patient much sooner if necessary  No orders of the defined types were placed in this encounter.    The total time spent in the appointment was 20-29 minutes  Irmalee Riemenschneider L Aleyda Gindlesperger, PA-C 04/05/24

## 2024-04-05 ENCOUNTER — Inpatient Hospital Stay

## 2024-04-05 ENCOUNTER — Inpatient Hospital Stay: Admitting: Physician Assistant

## 2024-04-05 VITALS — BP 140/102 | HR 91 | Temp 97.2°F | Resp 17 | Ht 62.5 in | Wt 166.0 lb

## 2024-04-05 VITALS — BP 140/102

## 2024-04-05 DIAGNOSIS — J329 Chronic sinusitis, unspecified: Secondary | ICD-10-CM | POA: Diagnosis not present

## 2024-04-05 DIAGNOSIS — C50211 Malignant neoplasm of upper-inner quadrant of right female breast: Secondary | ICD-10-CM

## 2024-04-05 DIAGNOSIS — Z170 Estrogen receptor positive status [ER+]: Secondary | ICD-10-CM | POA: Diagnosis not present

## 2024-04-05 LAB — CBC WITH DIFFERENTIAL (CANCER CENTER ONLY)
Abs Immature Granulocytes: 0.01 K/uL (ref 0.00–0.07)
Basophils Absolute: 0 K/uL (ref 0.0–0.1)
Basophils Relative: 1 %
Eosinophils Absolute: 0.2 K/uL (ref 0.0–0.5)
Eosinophils Relative: 7 %
HCT: 37.2 % (ref 36.0–46.0)
Hemoglobin: 12.7 g/dL (ref 12.0–15.0)
Immature Granulocytes: 0 %
Lymphocytes Relative: 31 %
Lymphs Abs: 1.2 K/uL (ref 0.7–4.0)
MCH: 29.2 pg (ref 26.0–34.0)
MCHC: 34.1 g/dL (ref 30.0–36.0)
MCV: 85.5 fL (ref 80.0–100.0)
Monocytes Absolute: 0.4 K/uL (ref 0.1–1.0)
Monocytes Relative: 10 %
Neutro Abs: 1.9 K/uL (ref 1.7–7.7)
Neutrophils Relative %: 51 %
Platelet Count: 268 K/uL (ref 150–400)
RBC: 4.35 MIL/uL (ref 3.87–5.11)
RDW: 13.8 % (ref 11.5–15.5)
WBC Count: 3.7 K/uL — ABNORMAL LOW (ref 4.0–10.5)
nRBC: 0 % (ref 0.0–0.2)

## 2024-04-05 LAB — CMP (CANCER CENTER ONLY)
ALT: 11 U/L (ref 0–44)
AST: 19 U/L (ref 15–41)
Albumin: 3.7 g/dL (ref 3.5–5.0)
Alkaline Phosphatase: 49 U/L (ref 38–126)
Anion gap: 6 (ref 5–15)
BUN: 12 mg/dL (ref 6–20)
CO2: 28 mmol/L (ref 22–32)
Calcium: 9 mg/dL (ref 8.9–10.3)
Chloride: 109 mmol/L (ref 98–111)
Creatinine: 0.71 mg/dL (ref 0.44–1.00)
GFR, Estimated: >60 mL/min (ref >60)
Glucose, Bld: 85 mg/dL (ref 70–99)
Potassium: 3.6 mmol/L (ref 3.5–5.1)
Sodium: 143 mmol/L (ref 135–145)
Total Bilirubin: 0.3 mg/dL (ref 0.0–1.2)
Total Protein: 6.3 g/dL — ABNORMAL LOW (ref 6.5–8.1)

## 2024-04-05 MED ORDER — DEXTROSE 5 % IV SOLN: INTRAVENOUS | Status: DC

## 2024-04-05 MED ORDER — ACETAMINOPHEN 325 MG PO TABS
650.0000 mg | ORAL_TABLET | Freq: Once | ORAL | Status: AC
  Administered 2024-04-05: 650 mg via ORAL
  Filled 2024-04-05: qty 2

## 2024-04-05 MED ORDER — AZITHROMYCIN 250 MG PO TABS: ORAL_TABLET | ORAL | 0 refills | Status: AC

## 2024-04-05 MED ORDER — DEXAMETHASONE SOD PHOSPHATE PF 10 MG/ML IJ SOLN
10.0000 mg | Freq: Once | INTRAMUSCULAR | Status: AC
  Administered 2024-04-05: 10 mg via INTRAVENOUS

## 2024-04-05 MED ORDER — LIDOCAINE-PRILOCAINE 2.5-2.5 % EX CREA: 1.0000 | TOPICAL_CREAM | CUTANEOUS | 2 refills | Status: AC | PRN

## 2024-04-05 MED ORDER — DIPHENHYDRAMINE HCL 25 MG PO CAPS
25.0000 mg | ORAL_CAPSULE | Freq: Once | ORAL | Status: AC
  Administered 2024-04-05: 25 mg via ORAL
  Filled 2024-04-05: qty 1

## 2024-04-05 MED ORDER — SODIUM CHLORIDE 0.9 % IV SOLN
150.0000 mg | Freq: Once | INTRAVENOUS | Status: AC
  Administered 2024-04-05: 150 mg via INTRAVENOUS
  Filled 2024-04-05: qty 150

## 2024-04-05 MED ORDER — FAM-TRASTUZUMAB DERUXTECAN-NXKI CHEMO 100 MG IV SOLR
5.4000 mg/kg | Freq: Once | INTRAVENOUS | Status: AC
  Administered 2024-04-05: 340 mg via INTRAVENOUS
  Filled 2024-04-05: qty 17

## 2024-04-05 MED ORDER — PALONOSETRON HCL INJECTION 0.25 MG/5ML
0.2500 mg | Freq: Once | INTRAVENOUS | Status: AC
  Administered 2024-04-05: 0.25 mg via INTRAVENOUS
  Filled 2024-04-05: qty 5

## 2024-04-05 MED ORDER — OLANZAPINE 5 MG PO TABS
5.0000 mg | ORAL_TABLET | Freq: Once | ORAL | Status: AC
  Administered 2024-04-05: 5 mg via ORAL
  Filled 2024-04-05: qty 1

## 2024-04-05 MED ORDER — ZOLEDRONIC ACID 4 MG/100ML IV SOLN
4.0000 mg | Freq: Once | INTRAVENOUS | Status: AC
  Administered 2024-04-05: 4 mg via INTRAVENOUS
  Filled 2024-04-05: qty 100

## 2024-04-05 NOTE — Progress Notes (Signed)
 Patient given instruction to keep a log of her BP readings at home and to contact her cardiologist if numbers remain high. Patient given strict ED precautions if symptoms of headache, chest discomfort, vision changes, s/s of stroke, lightheadedness. Patient verbalized understanding.

## 2024-04-05 NOTE — Patient Instructions (Signed)
 [facility] - A DEPT OF [facility]  Discharge Instructions: Thank you for choosing [facility] to provide your oncology and hematology care.   If you have a lab appointment with the Cancer Center, please go directly to the Cancer Center and check in at the registration area.   Wear comfortable clothing and clothing appropriate for easy access to any Portacath or PICC line.   We strive to give you quality time with your provider. You may need to reschedule your appointment if you arrive late (15 or more minutes).  Arriving late affects you and other patients whose appointments are after yours.  Also, if you miss three or more appointments without notifying the office, you may be dismissed from the clinic at the provider's discretion.      For prescription refill requests, have your pharmacy contact our office and allow 72 hours for refills to be completed.    Today you received the following chemotherapy and/or immunotherapy agents: fam-trastuzumab deruxtecan-nxki      To help prevent nausea and vomiting after your treatment, we encourage you to take your nausea medication as directed.  BELOW ARE SYMPTOMS THAT SHOULD BE REPORTED IMMEDIATELY: *FEVER GREATER THAN 100.4 F (38 C) OR HIGHER *CHILLS OR SWEATING *NAUSEA AND VOMITING THAT IS NOT CONTROLLED WITH YOUR NAUSEA MEDICATION *UNUSUAL SHORTNESS OF BREATH *UNUSUAL BRUISING OR BLEEDING *URINARY PROBLEMS (pain or burning when urinating, or frequent urination) *BOWEL PROBLEMS (unusual diarrhea, constipation, pain near the anus) TENDERNESS IN MOUTH AND THROAT WITH OR WITHOUT PRESENCE OF ULCERS (sore throat, sores in mouth, or a toothache) UNUSUAL RASH, SWELLING OR PAIN  UNUSUAL VAGINAL DISCHARGE OR ITCHING   Items with * indicate a potential emergency and should be followed up as soon as possible or go to the Emergency Department if any problems should occur.  Please show the CHEMOTHERAPY ALERT  CARD or IMMUNOTHERAPY ALERT CARD at check-in to the Emergency Department and triage nurse.  Should you have questions after your visit or need to cancel or reschedule your appointment, please contact [facility] - A DEPT OF [facility]  Dept: [phone]  and follow the prompts.  Office hours are 8:00 a.m. to 4:30 p.m. Monday - Friday. Please note that voicemails left after 4:00 p.m. may not be returned until the following business day.  We are closed weekends and major holidays. You have access to a nurse at all times for urgent questions. Please call the main number to the clinic Dept: [phone] and follow the prompts.   For any non-urgent questions, you may also contact your provider using MyChart. We now offer e-Visits for anyone 32 and older to request care online for non-urgent symptoms. For details visit [email or web address].   Also download the MyChart app! Go to the app store, search "MyChart", open the app, select [place], and log in with your MyChart username and password.

## 2024-04-26 ENCOUNTER — Encounter: Payer: Self-pay | Admitting: Hematology and Oncology

## 2024-04-26 ENCOUNTER — Inpatient Hospital Stay

## 2024-04-26 ENCOUNTER — Inpatient Hospital Stay: Admitting: Hematology and Oncology

## 2024-04-26 ENCOUNTER — Inpatient Hospital Stay: Attending: Hematology and Oncology

## 2024-04-26 VITALS — BP 136/103 | HR 96 | Temp 98.0°F | Resp 17 | Wt 164.6 lb

## 2024-04-26 VITALS — BP 150/110

## 2024-04-26 DIAGNOSIS — C50211 Malignant neoplasm of upper-inner quadrant of right female breast: Secondary | ICD-10-CM | POA: Diagnosis not present

## 2024-04-26 DIAGNOSIS — Z170 Estrogen receptor positive status [ER+]: Secondary | ICD-10-CM | POA: Diagnosis not present

## 2024-04-26 DIAGNOSIS — Z7901 Long term (current) use of anticoagulants: Secondary | ICD-10-CM | POA: Insufficient documentation

## 2024-04-26 DIAGNOSIS — Z800 Family history of malignant neoplasm of digestive organs: Secondary | ICD-10-CM | POA: Insufficient documentation

## 2024-04-26 DIAGNOSIS — Z9221 Personal history of antineoplastic chemotherapy: Secondary | ICD-10-CM | POA: Diagnosis not present

## 2024-04-26 DIAGNOSIS — Z807 Family history of other malignant neoplasms of lymphoid, hematopoietic and related tissues: Secondary | ICD-10-CM | POA: Diagnosis not present

## 2024-04-26 DIAGNOSIS — C7951 Secondary malignant neoplasm of bone: Secondary | ICD-10-CM | POA: Diagnosis not present

## 2024-04-26 DIAGNOSIS — Z9013 Acquired absence of bilateral breasts and nipples: Secondary | ICD-10-CM | POA: Insufficient documentation

## 2024-04-26 DIAGNOSIS — Z1721 Progesterone receptor positive status: Secondary | ICD-10-CM | POA: Insufficient documentation

## 2024-04-26 DIAGNOSIS — Z923 Personal history of irradiation: Secondary | ICD-10-CM | POA: Insufficient documentation

## 2024-04-26 DIAGNOSIS — Z803 Family history of malignant neoplasm of breast: Secondary | ICD-10-CM | POA: Diagnosis not present

## 2024-04-26 DIAGNOSIS — Z808 Family history of malignant neoplasm of other organs or systems: Secondary | ICD-10-CM | POA: Diagnosis not present

## 2024-04-26 DIAGNOSIS — Z1731 Human epidermal growth factor receptor 2 positive status: Secondary | ICD-10-CM | POA: Insufficient documentation

## 2024-04-26 DIAGNOSIS — Z79810 Long term (current) use of selective estrogen receptor modulators (SERMs): Secondary | ICD-10-CM | POA: Insufficient documentation

## 2024-04-26 DIAGNOSIS — Z9882 Breast implant status: Secondary | ICD-10-CM | POA: Insufficient documentation

## 2024-04-26 DIAGNOSIS — Z5112 Encounter for antineoplastic immunotherapy: Secondary | ICD-10-CM | POA: Diagnosis not present

## 2024-04-26 LAB — CMP (CANCER CENTER ONLY)
ALT: 13 U/L (ref 0–44)
AST: 19 U/L (ref 15–41)
Albumin: 3.9 g/dL (ref 3.5–5.0)
Alkaline Phosphatase: 54 U/L (ref 38–126)
Anion gap: 6 (ref 5–15)
BUN: 16 mg/dL (ref 6–20)
CO2: 27 mmol/L (ref 22–32)
Calcium: 9.1 mg/dL (ref 8.9–10.3)
Chloride: 109 mmol/L (ref 98–111)
Creatinine: 0.71 mg/dL (ref 0.44–1.00)
GFR, Estimated: >60 mL/min (ref >60)
Glucose, Bld: 98 mg/dL (ref 70–99)
Potassium: 3.9 mmol/L (ref 3.5–5.1)
Sodium: 142 mmol/L (ref 135–145)
Total Bilirubin: 0.3 mg/dL (ref 0.0–1.2)
Total Protein: 6.7 g/dL (ref 6.5–8.1)

## 2024-04-26 LAB — CBC WITH DIFFERENTIAL (CANCER CENTER ONLY)
Abs Immature Granulocytes: 0.02 K/uL (ref 0.00–0.07)
Basophils Absolute: 0 K/uL (ref 0.0–0.1)
Basophils Relative: 0 %
Eosinophils Absolute: 0.2 K/uL (ref 0.0–0.5)
Eosinophils Relative: 5 %
HCT: 39.2 % (ref 36.0–46.0)
Hemoglobin: 13.6 g/dL (ref 12.0–15.0)
Immature Granulocytes: 0 %
Lymphocytes Relative: 31 %
Lymphs Abs: 1.5 K/uL (ref 0.7–4.0)
MCH: 29.4 pg (ref 26.0–34.0)
MCHC: 34.7 g/dL (ref 30.0–36.0)
MCV: 84.7 fL (ref 80.0–100.0)
Monocytes Absolute: 0.5 K/uL (ref 0.1–1.0)
Monocytes Relative: 11 %
Neutro Abs: 2.6 K/uL (ref 1.7–7.7)
Neutrophils Relative %: 53 %
Platelet Count: 324 K/uL (ref 150–400)
RBC: 4.63 MIL/uL (ref 3.87–5.11)
RDW: 14.5 % (ref 11.5–15.5)
WBC Count: 4.9 K/uL (ref 4.0–10.5)
nRBC: 0 % (ref 0.0–0.2)

## 2024-04-26 MED ORDER — DEXAMETHASONE SOD PHOSPHATE PF 10 MG/ML IJ SOLN
10.0000 mg | Freq: Once | INTRAMUSCULAR | Status: AC
  Administered 2024-04-26: 10 mg via INTRAVENOUS

## 2024-04-26 MED ORDER — PALONOSETRON HCL INJECTION 0.25 MG/5ML
0.2500 mg | Freq: Once | INTRAVENOUS | Status: AC
  Administered 2024-04-26: 0.25 mg via INTRAVENOUS
  Filled 2024-04-26: qty 5

## 2024-04-26 MED ORDER — OLANZAPINE 5 MG PO TABS
5.0000 mg | ORAL_TABLET | Freq: Once | ORAL | Status: AC
  Administered 2024-04-26: 5 mg via ORAL
  Filled 2024-04-26: qty 1

## 2024-04-26 MED ORDER — DIPHENHYDRAMINE HCL 25 MG PO CAPS
25.0000 mg | ORAL_CAPSULE | Freq: Once | ORAL | Status: AC
  Administered 2024-04-26: 25 mg via ORAL
  Filled 2024-04-26: qty 1

## 2024-04-26 MED ORDER — SODIUM CHLORIDE 0.9 % IV SOLN
150.0000 mg | Freq: Once | INTRAVENOUS | Status: AC
  Administered 2024-04-26: 150 mg via INTRAVENOUS
  Filled 2024-04-26: qty 150

## 2024-04-26 MED ORDER — ACETAMINOPHEN 325 MG PO TABS
650.0000 mg | ORAL_TABLET | Freq: Once | ORAL | Status: AC
  Administered 2024-04-26: 650 mg via ORAL
  Filled 2024-04-26: qty 2

## 2024-04-26 MED ORDER — DEXTROSE 5 % IV SOLN: INTRAVENOUS | Status: DC

## 2024-04-26 MED ORDER — FAM-TRASTUZUMAB DERUXTECAN-NXKI CHEMO 100 MG IV SOLR
5.4000 mg/kg | Freq: Once | INTRAVENOUS | Status: AC
  Administered 2024-04-26: 340 mg via INTRAVENOUS
  Filled 2024-04-26: qty 17

## 2024-04-26 NOTE — Progress Notes (Signed)
 [facility] Cancer Follow up:    Sherry Artist PARAS, MD [address] [place] [address]   DIAGNOSIS:  Cancer Staging  Malignant neoplasm of upper-inner quadrant of right breast in female, estrogen receptor positive (HCC) Staging form: Breast, AJCC 7th Edition - Pathologic stage from 07/14/2012: Stage IV (TX, NX, M1) - Signed by [name], MD on 08/28/2021 Specimen type: Core Needle Biopsy Histopathologic type: 9931 Laterality: Right    SUMMARY OF ONCOLOGIC HISTORY: Oncology History  Malignant neoplasm of upper-inner quadrant of right breast in female, estrogen receptor positive (HCC)  07/13/2012 Clinical Stage   Stage IA: T1c N0   07/14/2012 Definitive Surgery   Bilateral mastectomy/right SLNB: RIGHT invasive ductal carcinoma, grade 3, ER+, PR +, Her2/neu positive (ratio 3.02), Ki67 48%. DCIS. 1/4 LN positive for malignancy. LEFT: benign   07/14/2012 Pathologic Stage   Stage IIB: T2 N1a M0   07/14/2012 Cancer Staging   Staging form: Breast, AJCC 7th Edition - Pathologic stage from 07/14/2012: Stage IV (TX, NX, M1) - Signed by [name], MD on 08/28/2021 Specimen type: Core Needle Biopsy Histopathologic type: 9931 Laterality: Right   08/17/2012 - 08/30/2013 Chemotherapy   Adjuvant carboplatin , docetaxel , and trastuzumab  x 6 cycles (completed 11/30/2012) followed by maintenance trastuzumab  to total one year   11/2012 Procedure   Comp Cancer Gene panel (GeneDx) negative for deleterious mutations    - 02/2013 Radiation Therapy   Adjuvant RT to right breast   02/2013 -  Anti-estrogen oral therapy   Tamoxifen  20 mg daily   09/24/2013 Surgery   Bilateral breast reconstruction with latissmus flap and expander placement   12/12/2013 Surgery   Implant placement    Relapse/Recurrence   Left sided malignant pleural effusion.  Tumor cells are positive for GATA3 and ER, negative for TTF-1 consistent with recurrent breast carcinoma Prognostic showed ER 90%  positive strong staining, PR 80% positive strong staining, HER2 negative.    09/16/2021 Imaging   PET scan showed malignant left pleural effusion with extensive areas of nodularity and hypermetabolic activity involving the mediastinal border and pericardium as well as the most inferior aspects of the costodiaphragmatic recess.  Signs of nodal disease at the thoracic inlet on the left suspect extension of disease below the diaphragm along the left anterolateral aorta.  Nonspecific moderate to markedly increased metabolic activity about the base of the tongue bilaterally.  Consider direct visualization showing mildly asymmetric uptake favoring the right lingual tonsil.  Sclerotic foci without marked increased metabolic activity suspicious for metastatic disease perhaps treated or questions.  Heterogeneous marrow uptake seen elsewhere generalized and nonspecific.  Consider spinal MRI is warranted  MRI brain without any evidence of intracranial metastatic disease.   10/09/2021 - 02/11/2022 Chemotherapy   Taxotere , Herceptin , Perjeta  x 6   12/04/2021 Imaging   There is no evidence new metastatic disease. There is interval decrease in the left pleural effusion possibly suggesting resolving pleural metastatic disease. Few scattered sclerotic metastatic lesions in the skeletal structures have not changed significantly.   No acute findings are seen in the chest abdomen and pelvis.     02/22/2022 -  Antibody Plan   Herceptin /Perjeta  every 3 weeks   03/21/2022 Imaging   CT chest abdomen pelvis  IMPRESSION: 1. No significant change since previous study. Again demonstrated is a chronic small left pleural effusion with basilar atelectasis and scattered sclerotic skeletal metastases. 2. No acute abnormalities are demonstrated. 3. Small esophageal hiatal hernia.     Electronically Signed  By: [name] M.D.   On: 03/21/2022 20:23   07/22/2023 -  Chemotherapy   Patient is on Treatment Plan :  BREAST Fam-Trastuzumab Deruxtecan-nxki  (Enhertu ) (5.4) q21d       CURRENT THERAPY: Enhertu   INTERVAL HISTORY:  Discussed the use of AI scribe software for clinical note transcription with the patient, who gave verbal consent to proceed.  History of Present Illness  Sherry Barry is a 53 year old female with a history of metastatic breast cancer on enhertu  now here for follow up.  She experienced a cold and sinus infection a few weeks ago, which took about a month to resolve. She continues to have some congestion, though it is improving. She considered taking antibiotics but did not.  She reports intermittent fatigue.  She is currently on a blood thinner and is concerned about blood clots due to her history, though she feels reassured by her medication regimen.  She has gained weight recently, attributing it to a good appetite. During the review of symptoms, she confirmed weight gain and a good appetite.  Her hair is regrowing after previous hair loss, which she managed by cutting her hair short.  She works in HR and walks frequently, which sometimes causes foot pain. She is concerned about the potential for blood clots due to her activity level.   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  MEDICAL HISTORY: Past Medical History:  Diagnosis Date   Allergy    Breast cancer (HCC)    Breast cancer (HCC)    right   History of chemotherapy    doxetaxel/carboplatin /trastuzumab    History of migraine    last one about a week ago   Hx of radiation therapy 01/01/13- 02/15/13   r chest wall, r supraclav/axilla 5040 cGy/28 sessions, scar boost 1000 cGy/5 sessions   Hypertension    no meds,   urgent care on pomana   Migraine    Migraine     SURGICAL HISTORY: Past Surgical History:  Procedure Laterality Date   ABDOMINAL HYSTERECTOMY     no salpingo-oophorectomy 2009   BREAST RECONSTRUCTION WITH PLACEMENT OF TISSUE EXPANDER AND FLEX HD (ACELLULAR HYDRATED DERMIS) Left 09/24/2013   IR IMAGING GUIDED PORT INSERTION  10/07/2021   IR THORACENTESIS ASP PLEURAL SPACE W/IMG GUIDE  08/11/2021   IR THORACENTESIS ASP PLEURAL SPACE W/IMG GUIDE  10/07/2021   LATISSIMUS FLAP TO BREAST Right 09/24/2013   Procedure: RIGHT LATISSMUS MYOCUTAEIOUS MUSCLE FLAP AND PLACEMENT OF TISSUE LESLEIGH;  Surgeon: Estefana Reichert, DO;  Location: [facility];  Service: Plastics;  Laterality: Right;   LIPOSUCTION WITH LIPOFILLING Bilateral 12/12/2013   Procedure: LIPOSUCTION WITH LIPOFILLING;  Surgeon: Estefana Reichert, DO;  Location: [facility];  Service: Plastics;  Laterality: Bilateral;   PORT-A-CATH REMOVAL Left 12/12/2013   Procedure: REMOVAL PORT-A-CATH;  Surgeon: Estefana Reichert, DO;  Location:  [facility];  Service: Plastics;  Laterality: Left;   PORTACATH PLACEMENT  07/14/2012   Procedure: INSERTION PORT-A-CATH;  Surgeon: Debby LABOR. Cornett, MD;  Location: [facility];  Service: General;  Laterality: Left;   RECONSTRUCTION BREAST W/ LATISSIMUS DORSI FLAP Right 09/24/2013   & tissue expander placement   REMOVAL OF BILATERAL TISSUE EXPANDERS WITH PLACEMENT OF BILATERAL BREAST IMPLANTS Bilateral 12/12/2013   Procedure: REMOVAL OF BILATERAL TISSUE EXPANDERS WITH PLACEMENT OF  BILATERAL BREAST IMPLANTS/BILATERAL CAPSULECTOMIES WITH  LIPOFILLING FAT GRAFTING;  Surgeon: Estefana Reichert, DO;  Location: [facility];  Service: Plastics;  Laterality: Bilateral;   SIMPLE MASTECTOMY WITH AXILLARY SENTINEL NODE BIOPSY  07/14/2012   Procedure: SIMPLE MASTECTOMY WITH AXILLARY SENTINEL NODE BIOPSY;  Surgeon: Debby LABOR. Cornett, MD;  Location: [facility];  Service: General;  Laterality: Right;  Bilateral simple mastectomy with port and right sebtibel lymph node mapping   SIMPLE MASTECTOMY WITH AXILLARY SENTINEL NODE BIOPSY  07/14/2012   Procedure: SIMPLE MASTECTOMY;  Surgeon: Debby LABOR. Cornett, MD;  Location: [facility];  Service: General;  Laterality: Left;   TISSUE EXPANDER PLACEMENT Left 09/24/2013   Procedure: PLACEMENT OF TISSUE EXPANDER AND FLEX HD TO LEFT BREAST;  Surgeon: Estefana Reichert, DO;  Location: [facility];  Service: Plastics;  Laterality: Left;    SOCIAL HISTORY: Social History   Socioeconomic History   Marital status: Married    Spouse name: Not on file   Number of children: 2   Years of education: Not on file   Highest education level: Not on file  Occupational History    Employer: [employer]  Tobacco Use   Smoking status: Never   Smokeless tobacco: Never  Substance and Sexual Activity   Alcohol use: Yes    Comment: occasional   Drug use: No   Sexual activity: Yes    Birth control/protection: Surgical  Other Topics Concern   Not on file  Social History Narrative   Not on file   Social Drivers of Health   Financial Resource Strain: Not on file  Food Insecurity: No Food Insecurity (12/11/2023)   Hunger Vital Sign    Worried About Running Out of Food in the Last Year: Never true    Ran Out of Food in the Last Year: Never true  Transportation Needs: No Transportation Needs (12/11/2023)   PRAPARE - Administrator, Civil Service (Medical): No    Lack of Transportation (Non-Medical): No  Physical Activity: Not on file  Stress: Not on file   Social Connections: Not on file  Intimate Partner Violence: Not At Risk (12/11/2023)   Humiliation, Afraid, Rape, and Kick questionnaire    Fear of Current or Ex-Partner: No    Emotionally Abused: No    Physically Abused: No    Sexually Abused: No    FAMILY HISTORY: Family History  Problem Relation Age of Onset   Hypertension Mother    Alcohol abuse Mother    Heart disease Maternal Grandmother    Stroke Maternal Grandfather    Colon cancer Maternal Aunt 55       alive at 36   Brain cancer Maternal Uncle 91       and lymphoma in early 87s; deceased   Brain cancer Maternal Uncle 60       deceased   Pancreatic cancer Maternal Uncle 45       alive at 58   Breast cancer Cousin 69       mat 1st cousin  once removed through mat GF ; deceased   Breast cancer Maternal Aunt        great aunt through mat GF; dx at ? age    Review of Systems  Constitutional:  Negative for appetite change, chills, fatigue, fever and unexpected weight change.  HENT:   Negative for hearing loss, lump/mass and trouble swallowing.   Eyes:  Negative for eye problems and icterus.  Respiratory:  Negative for chest tightness, cough and shortness of breath.   Cardiovascular:  Negative for chest pain, leg swelling and palpitations.  Gastrointestinal:  Negative for abdominal distention, abdominal pain, constipation, diarrhea, nausea and vomiting.  Endocrine: Negative for hot flashes.  Genitourinary:  Negative for difficulty urinating.   Musculoskeletal:  Negative for arthralgias.  Skin:  Negative for itching and rash.  Neurological:  Negative for dizziness, extremity weakness, headaches and numbness.  Hematological:  Negative for adenopathy. Does not bruise/bleed easily.  Psychiatric/Behavioral:  Negative for depression. The patient is not nervous/anxious.       PHYSICAL EXAMINATION      Vitals:   04/26/24 1237  BP: (!) 136/103  Pulse: 96  Resp: 17  Temp: 98 F (36.7 C)  SpO2: 100%    Physical  Exam Constitutional:      General: She is not in acute distress.    Appearance: Normal appearance. She is not toxic-appearing.  HENT:     Head: Normocephalic and atraumatic.     Mouth/Throat:     Mouth: Mucous membranes are moist.     Pharynx: Oropharynx is clear. No oropharyngeal exudate or posterior oropharyngeal erythema.  Eyes:     General: No scleral icterus. Cardiovascular:     Rate and Rhythm: Normal rate and regular rhythm.     Pulses: Normal pulses.     Heart sounds: Normal heart sounds.  Pulmonary:     Effort: Pulmonary effort is normal.     Breath sounds: Normal breath sounds.  Abdominal:     General: Abdomen is flat. Bowel sounds are normal. There is no distension.     Palpations: Abdomen is soft.     Tenderness: There is no abdominal tenderness.  Musculoskeletal:        General: No swelling.     Cervical back: Neck supple.  Lymphadenopathy:     Cervical: No cervical adenopathy.  Skin:    General: Skin is warm and dry.     Findings: No rash.  Neurological:     General: No focal deficit present.     Mental Status: She is alert.  Psychiatric:        Mood and Affect: Mood normal.        Behavior: Behavior normal.     LABORATORY DATA:  CBC    Component Value Date/Time   WBC 4.9 04/26/2024 1207   WBC 11.1 (H) 12/12/2023 0353   RBC 4.63 04/26/2024 1207   HGB 13.6 04/26/2024 1207   HGB 15.9 10/30/2019 1030   HGB 14.7 02/15/2017 0852   HCT 39.2 04/26/2024 1207   HCT 46.1 10/30/2019 1030   HCT 42.9 02/15/2017 0852   PLT 324 04/26/2024 1207   PLT 245 10/30/2019 1030   MCV 84.7 04/26/2024 1207   MCV 88 10/30/2019 1030   MCV 86.7 02/15/2017 0852   MCH 29.4 04/26/2024 1207   MCHC 34.7 04/26/2024 1207   RDW 14.5 04/26/2024 1207   RDW 12.5 10/30/2019 1030   RDW 12.6 02/15/2017 0852   LYMPHSABS 1.5 04/26/2024 1207   LYMPHSABS 1.4  02/15/2017 0852   MONOABS 0.5 04/26/2024 1207   MONOABS 0.3 02/15/2017 0852   EOSABS 0.2 04/26/2024 1207   EOSABS 0.1  02/15/2017 0852   BASOSABS 0.0 04/26/2024 1207   BASOSABS 0.0 02/15/2017 0852    CMP     Component Value Date/Time   NA 143 04/05/2024 0733   NA 140 10/30/2019 1030   NA 141 02/15/2017 0852   K 3.6 04/05/2024 0733   K 3.9 02/15/2017 0852   CL 109 04/05/2024 0733   CL 101 11/15/2012 0904   CO2 28 04/05/2024 0733   CO2 25 02/15/2017 0852   GLUCOSE 85 04/05/2024 0733   GLUCOSE 87 02/15/2017 0852   GLUCOSE 69 (L) 11/15/2012 0904   BUN 12 04/05/2024 0733   BUN 11 10/30/2019 1030   BUN 12.5 02/15/2017 0852   CREATININE 0.71 04/05/2024 0733   CREATININE 0.9 02/15/2017 0852   CALCIUM 9.0 04/05/2024 0733   CALCIUM 9.3 02/15/2017 0852   PROT 6.3 (L) 04/05/2024 0733   PROT 6.7 10/30/2019 1030   PROT 7.0 02/15/2017 0852   ALBUMIN 3.7 04/05/2024 0733   ALBUMIN 4.2 10/30/2019 1030   ALBUMIN 3.6 02/15/2017 0852   AST 19 04/05/2024 0733   AST 16 02/15/2017 0852   ALT 11 04/05/2024 0733   ALT 13 02/15/2017 0852   ALKPHOS 49 04/05/2024 0733   ALKPHOS 63 02/15/2017 0852   BILITOT 0.3 04/05/2024 0733   BILITOT 0.69 02/15/2017 0852   GFRNONAA >60 04/05/2024 0733   GFRAA 100 10/30/2019 1030     ASSESSMENT and THERAPY PLAN:   Assessment and Plan Assessment & Plan  Metastatic breast cancer on enhertu  She is tolerating it remarkably well except for some hair loss and fatigue No concerns on ROS or PE Last scans show improvement in disease, no new or progressive met disease Continue enhertu , repeat imaging in Dec or January Most recent ECHO satisfactory, repeat planned in 6 months.  Venous thromboembolism Continue anticoagulation indefinitely  All questions were answered. The patient knows to call the clinic with any problems, questions or concerns. We can certainly see the patient much sooner if necessary.  Total encounter time:30 minutes*in face-to-face visit time, chart review, lab review, care coordination, order entry, and documentation of the encounter time.  *Total  Encounter Time as defined by the Centers for Medicare and Medicaid Services includes, in addition to the face-to-face time of a patient visit (documented in the note above) non-face-to-face time: obtaining and reviewing outside history, ordering and reviewing medications, tests or procedures, care coordination (communications with other health care professionals or caregivers) and documentation in the medical record.

## 2024-04-26 NOTE — Patient Instructions (Signed)
 [facility] - A DEPT OF [facility]  Discharge Instructions: Thank you for choosing [facility] to provide your oncology and hematology care.   If you have a lab appointment with the Cancer Center, please go directly to the Cancer Center and check in at the registration area.   Wear comfortable clothing and clothing appropriate for easy access to any Portacath or PICC line.   We strive to give you quality time with your provider. You may need to reschedule your appointment if you arrive late (15 or more minutes).  Arriving late affects you and other patients whose appointments are after yours.  Also, if you miss three or more appointments without notifying the office, you may be dismissed from the clinic at the provider's discretion.      For prescription refill requests, have your pharmacy contact our office and allow 72 hours for refills to be completed.    Today you received the following chemotherapy and/or immunotherapy agents: fam-trastuzumab deruxtecan-nxki  (Enhertu )      To help prevent nausea and vomiting after your treatment, we encourage you to take your nausea medication as directed.  BELOW ARE SYMPTOMS THAT SHOULD BE REPORTED IMMEDIATELY: *FEVER GREATER THAN 100.4 F (38 C) OR HIGHER *CHILLS OR SWEATING *NAUSEA AND VOMITING THAT IS NOT CONTROLLED WITH YOUR NAUSEA MEDICATION *UNUSUAL SHORTNESS OF BREATH *UNUSUAL BRUISING OR BLEEDING *URINARY PROBLEMS (pain or burning when urinating, or frequent urination) *BOWEL PROBLEMS (unusual diarrhea, constipation, pain near the anus) TENDERNESS IN MOUTH AND THROAT WITH OR WITHOUT PRESENCE OF ULCERS (sore throat, sores in mouth, or a toothache) UNUSUAL RASH, SWELLING OR PAIN  UNUSUAL VAGINAL DISCHARGE OR ITCHING   Items with * indicate a potential emergency and should be followed up as soon as possible or go to the Emergency Department if any problems should occur.  Please show the  CHEMOTHERAPY ALERT CARD or IMMUNOTHERAPY ALERT CARD at check-in to the Emergency Department and triage nurse.  Should you have questions after your visit or need to cancel or reschedule your appointment, please contact [facility] - A DEPT OF [facility]  Dept: [phone]  and follow the prompts.  Office hours are 8:00 a.m. to 4:30 p.m. Monday - Friday. Please note that voicemails left after 4:00 p.m. may not be returned until the following business day.  We are closed weekends and major holidays. You have access to a nurse at all times for urgent questions. Please call the main number to the clinic Dept: [phone] and follow the prompts.   For any non-urgent questions, you may also contact your provider using MyChart. We now offer e-Visits for anyone 79 and older to request care online for non-urgent symptoms. For details visit [email or web address].   Also download the MyChart app! Go to the app store, search MyChart, open the app, select [place], and log in with your MyChart username and password.

## 2024-04-30 DIAGNOSIS — I10 Essential (primary) hypertension: Secondary | ICD-10-CM | POA: Diagnosis not present

## 2024-04-30 DIAGNOSIS — Z13220 Encounter for screening for lipoid disorders: Secondary | ICD-10-CM | POA: Diagnosis not present

## 2024-04-30 DIAGNOSIS — C782 Secondary malignant neoplasm of pleura: Secondary | ICD-10-CM | POA: Diagnosis not present

## 2024-04-30 DIAGNOSIS — Z23 Encounter for immunization: Secondary | ICD-10-CM | POA: Diagnosis not present

## 2024-04-30 DIAGNOSIS — C7951 Secondary malignant neoplasm of bone: Secondary | ICD-10-CM | POA: Diagnosis not present

## 2024-04-30 DIAGNOSIS — Z0000 Encounter for general adult medical examination without abnormal findings: Secondary | ICD-10-CM | POA: Diagnosis not present

## 2024-04-30 DIAGNOSIS — F411 Generalized anxiety disorder: Secondary | ICD-10-CM | POA: Diagnosis not present

## 2024-05-03 ENCOUNTER — Other Ambulatory Visit: Payer: Self-pay

## 2024-05-16 MED FILL — Fosaprepitant Dimeglumine For IV Infusion 150 MG (Base Eq): INTRAVENOUS | Qty: 5 | Status: AC

## 2024-05-17 ENCOUNTER — Inpatient Hospital Stay: Attending: Hematology and Oncology

## 2024-05-17 ENCOUNTER — Inpatient Hospital Stay: Admitting: Hematology and Oncology

## 2024-05-17 ENCOUNTER — Inpatient Hospital Stay

## 2024-05-17 ENCOUNTER — Encounter: Payer: Self-pay | Admitting: Hematology and Oncology

## 2024-05-17 VITALS — BP 142/94 | HR 108 | Temp 98.2°F | Resp 18 | Ht 62.5 in | Wt 166.0 lb

## 2024-05-17 VITALS — BP 122/84 | HR 94 | Resp 14

## 2024-05-17 DIAGNOSIS — Z1721 Progesterone receptor positive status: Secondary | ICD-10-CM | POA: Diagnosis not present

## 2024-05-17 DIAGNOSIS — Z800 Family history of malignant neoplasm of digestive organs: Secondary | ICD-10-CM | POA: Insufficient documentation

## 2024-05-17 DIAGNOSIS — Z807 Family history of other malignant neoplasms of lymphoid, hematopoietic and related tissues: Secondary | ICD-10-CM | POA: Insufficient documentation

## 2024-05-17 DIAGNOSIS — Z9013 Acquired absence of bilateral breasts and nipples: Secondary | ICD-10-CM | POA: Diagnosis not present

## 2024-05-17 DIAGNOSIS — C50211 Malignant neoplasm of upper-inner quadrant of right female breast: Secondary | ICD-10-CM

## 2024-05-17 DIAGNOSIS — Z86718 Personal history of other venous thrombosis and embolism: Secondary | ICD-10-CM | POA: Insufficient documentation

## 2024-05-17 DIAGNOSIS — Z808 Family history of malignant neoplasm of other organs or systems: Secondary | ICD-10-CM | POA: Diagnosis not present

## 2024-05-17 DIAGNOSIS — Z5112 Encounter for antineoplastic immunotherapy: Secondary | ICD-10-CM | POA: Diagnosis present

## 2024-05-17 DIAGNOSIS — Z9221 Personal history of antineoplastic chemotherapy: Secondary | ICD-10-CM | POA: Insufficient documentation

## 2024-05-17 DIAGNOSIS — Z170 Estrogen receptor positive status [ER+]: Secondary | ICD-10-CM | POA: Insufficient documentation

## 2024-05-17 DIAGNOSIS — Z803 Family history of malignant neoplasm of breast: Secondary | ICD-10-CM | POA: Insufficient documentation

## 2024-05-17 DIAGNOSIS — Z923 Personal history of irradiation: Secondary | ICD-10-CM | POA: Diagnosis not present

## 2024-05-17 DIAGNOSIS — Z1731 Human epidermal growth factor receptor 2 positive status: Secondary | ICD-10-CM | POA: Diagnosis not present

## 2024-05-17 DIAGNOSIS — Z79810 Long term (current) use of selective estrogen receptor modulators (SERMs): Secondary | ICD-10-CM | POA: Insufficient documentation

## 2024-05-17 DIAGNOSIS — C7951 Secondary malignant neoplasm of bone: Secondary | ICD-10-CM | POA: Insufficient documentation

## 2024-05-17 LAB — CBC WITH DIFFERENTIAL (CANCER CENTER ONLY)
Abs Immature Granulocytes: 0.02 K/uL (ref 0.00–0.07)
Basophils Absolute: 0 K/uL (ref 0.0–0.1)
Basophils Relative: 0 %
Eosinophils Absolute: 0.2 K/uL (ref 0.0–0.5)
Eosinophils Relative: 4 %
HCT: 39.5 % (ref 36.0–46.0)
Hemoglobin: 13.8 g/dL (ref 12.0–15.0)
Immature Granulocytes: 0 %
Lymphocytes Relative: 33 %
Lymphs Abs: 1.7 K/uL (ref 0.7–4.0)
MCH: 29.5 pg (ref 26.0–34.0)
MCHC: 34.9 g/dL (ref 30.0–36.0)
MCV: 84.4 fL (ref 80.0–100.0)
Monocytes Absolute: 0.5 K/uL (ref 0.1–1.0)
Monocytes Relative: 10 %
Neutro Abs: 2.7 K/uL (ref 1.7–7.7)
Neutrophils Relative %: 53 %
Platelet Count: 332 K/uL (ref 150–400)
RBC: 4.68 MIL/uL (ref 3.87–5.11)
RDW: 14.8 % (ref 11.5–15.5)
WBC Count: 5.2 K/uL (ref 4.0–10.5)
nRBC: 0 % (ref 0.0–0.2)

## 2024-05-17 LAB — CMP (CANCER CENTER ONLY)
ALT: 27 U/L (ref 0–44)
AST: 36 U/L (ref 15–41)
Albumin: 4.3 g/dL (ref 3.5–5.0)
Alkaline Phosphatase: 58 U/L (ref 38–126)
Anion gap: 11 (ref 5–15)
BUN: 15 mg/dL (ref 6–20)
CO2: 24 mmol/L (ref 22–32)
Calcium: 9.5 mg/dL (ref 8.9–10.3)
Chloride: 105 mmol/L (ref 98–111)
Creatinine: 0.65 mg/dL (ref 0.44–1.00)
GFR, Estimated: >60 mL/min (ref >60)
Glucose, Bld: 89 mg/dL (ref 70–99)
Potassium: 3.6 mmol/L (ref 3.5–5.1)
Sodium: 140 mmol/L (ref 135–145)
Total Bilirubin: 0.3 mg/dL (ref 0.0–1.2)
Total Protein: 7.2 g/dL (ref 6.5–8.1)

## 2024-05-17 MED ORDER — DEXTROSE 5 % IV SOLN: INTRAVENOUS | Status: DC

## 2024-05-17 MED ORDER — SODIUM CHLORIDE 0.9 % IV SOLN
150.0000 mg | Freq: Once | INTRAVENOUS | Status: AC
  Administered 2024-05-17: 150 mg via INTRAVENOUS
  Filled 2024-05-17: qty 150

## 2024-05-17 MED ORDER — DEXAMETHASONE SOD PHOSPHATE PF 10 MG/ML IJ SOLN
10.0000 mg | Freq: Once | INTRAMUSCULAR | Status: AC
  Administered 2024-05-17: 10 mg via INTRAVENOUS

## 2024-05-17 MED ORDER — PALONOSETRON HCL INJECTION 0.25 MG/5ML
0.2500 mg | Freq: Once | INTRAVENOUS | Status: AC
  Administered 2024-05-17: 0.25 mg via INTRAVENOUS
  Filled 2024-05-17: qty 5

## 2024-05-17 MED ORDER — DIPHENHYDRAMINE HCL 25 MG PO CAPS
25.0000 mg | ORAL_CAPSULE | Freq: Once | ORAL | Status: AC
  Administered 2024-05-17: 25 mg via ORAL
  Filled 2024-05-17: qty 1

## 2024-05-17 MED ORDER — OLANZAPINE 5 MG PO TABS
5.0000 mg | ORAL_TABLET | Freq: Once | ORAL | Status: AC
  Administered 2024-05-17: 5 mg via ORAL
  Filled 2024-05-17: qty 1

## 2024-05-17 MED ORDER — FAM-TRASTUZUMAB DERUXTECAN-NXKI CHEMO 100 MG IV SOLR
5.4000 mg/kg | Freq: Once | INTRAVENOUS | Status: AC
  Administered 2024-05-17: 340 mg via INTRAVENOUS
  Filled 2024-05-17: qty 17

## 2024-05-17 MED ORDER — ACETAMINOPHEN 325 MG PO TABS
650.0000 mg | ORAL_TABLET | Freq: Once | ORAL | Status: AC
  Administered 2024-05-17: 650 mg via ORAL
  Filled 2024-05-17: qty 2

## 2024-05-17 NOTE — Patient Instructions (Signed)

## 2024-05-17 NOTE — Progress Notes (Signed)
 [facility] Cancer Follow up:    Sherry Artist PARAS, MD [address] [place] [address]   DIAGNOSIS:  Cancer Staging  Malignant neoplasm of upper-inner quadrant of right breast in female, estrogen receptor positive (HCC) Staging form: Breast, AJCC 7th Edition - Pathologic stage from 07/14/2012: Stage IV (TX, NX, M1) - Signed by Sherry Ash, MD on 08/28/2021 Specimen type: Core Needle Biopsy Histopathologic type: 9931 Laterality: Right    SUMMARY OF ONCOLOGIC HISTORY: Oncology History  Malignant neoplasm of upper-inner quadrant of right breast in female, estrogen receptor positive (HCC)  07/13/2012 Clinical Stage   Stage IA: T1c N0   07/14/2012 Definitive Surgery   Bilateral mastectomy/right SLNB: RIGHT invasive ductal carcinoma, grade 3, ER+, PR +, Her2/neu positive (ratio 3.02), Ki67 48%. DCIS. 1/4 LN positive for malignancy. LEFT: benign   07/14/2012 Pathologic Stage   Stage IIB: T2 N1a M0   07/14/2012 Cancer Staging   Staging form: Breast, AJCC 7th Edition - Pathologic stage from 07/14/2012: Stage IV (TX, NX, M1) - Signed by Sherry Ash, MD on 08/28/2021 Specimen type: Core Needle Biopsy Histopathologic type: 9931 Laterality: Right   08/17/2012 - 08/30/2013 Chemotherapy   Adjuvant carboplatin , docetaxel , and trastuzumab  x 6 cycles (completed 11/30/2012) followed by maintenance trastuzumab  to total one year   11/2012 Procedure   Comp Cancer Gene panel (GeneDx) negative for deleterious mutations    - 02/2013 Radiation Therapy   Adjuvant RT to right breast   02/2013 -  Anti-estrogen oral therapy   Tamoxifen  20 mg daily   09/24/2013 Surgery   Bilateral breast reconstruction with latissmus flap and expander placement   12/12/2013 Surgery   Implant placement    Relapse/Recurrence   Left sided malignant pleural effusion.  Tumor cells are positive for GATA3 and ER, negative for TTF-1 consistent with recurrent breast carcinoma Prognostic showed ER 90%  positive strong staining, PR 80% positive strong staining, HER2 negative.    09/16/2021 Imaging   PET scan showed malignant left pleural effusion with extensive areas of nodularity and hypermetabolic activity involving the mediastinal border and pericardium as well as the most inferior aspects of the costodiaphragmatic recess.  Signs of nodal disease at the thoracic inlet on the left suspect extension of disease below the diaphragm along the left anterolateral aorta.  Nonspecific moderate to markedly increased metabolic activity about the base of the tongue bilaterally.  Consider direct visualization showing mildly asymmetric uptake favoring the right lingual tonsil.  Sclerotic foci without marked increased metabolic activity suspicious for metastatic disease perhaps treated or questions.  Heterogeneous marrow uptake seen elsewhere generalized and nonspecific.  Consider spinal MRI is warranted  MRI brain without any evidence of intracranial metastatic disease.   10/09/2021 - 02/11/2022 Chemotherapy   Taxotere , Herceptin , Perjeta  x 6   12/04/2021 Imaging   There is no evidence new metastatic disease. There is interval decrease in the left pleural effusion possibly suggesting resolving pleural metastatic disease. Few scattered sclerotic metastatic lesions in the skeletal structures have not changed significantly.   No acute findings are seen in the chest abdomen and pelvis.     02/22/2022 -  Antibody Plan   Herceptin /Perjeta  every 3 weeks   03/21/2022 Imaging   CT chest abdomen pelvis  IMPRESSION: 1. No significant change since previous study. Again demonstrated is a chronic small left pleural effusion with basilar atelectasis and scattered sclerotic skeletal metastases. 2. No acute abnormalities are demonstrated. 3. Small esophageal hiatal hernia.     Electronically Signed  By: Sherry Barry M.D.   On: 03/21/2022 20:23   07/22/2023 -  Chemotherapy   Patient is on Treatment Plan :  BREAST Fam-Trastuzumab Deruxtecan-nxki  (Enhertu ) (5.4) q21d       CURRENT THERAPY: Enhertu   INTERVAL HISTORY:  Discussed the use of AI scribe software for clinical note transcription with the patient, who gave verbal consent to proceed.  History of Present Illness   Sherry Barry is a 53 year old female with metastatic breast cancer on enhertu  who presents for routine follow-up in hematology and medical oncology.  She has no new symptoms since her last visit. No increased nausea, vomiting, chest pain, cough, or shortness of breath. She experiences fatigue.  Her current medications include a blood thinner, which she takes regularly. She does not require a refill at this time.  Her last echocardiogram in September was normal, and she is scheduled for another in March.   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  MEDICAL HISTORY: Past Medical History:  Diagnosis Date   Allergy    Breast cancer (HCC)    Breast cancer (HCC)    right   History of chemotherapy    doxetaxel/carboplatin /trastuzumab    History of migraine    last one about a week ago   Hx of radiation therapy 01/01/13- 02/15/13   r chest wall, r supraclav/axilla 5040 cGy/28 sessions, scar boost  1000 cGy/5 sessions   Hypertension    no meds,   urgent care on pomana   Migraine    Migraine     SURGICAL HISTORY: Past Surgical History:  Procedure Laterality Date   ABDOMINAL HYSTERECTOMY     no salpingo-oophorectomy 2009   BREAST RECONSTRUCTION WITH PLACEMENT OF TISSUE EXPANDER AND FLEX HD (ACELLULAR HYDRATED DERMIS) Left 09/24/2013   IR IMAGING GUIDED PORT INSERTION  10/07/2021   IR THORACENTESIS ASP PLEURAL SPACE W/IMG GUIDE  08/11/2021   IR THORACENTESIS ASP PLEURAL SPACE W/IMG GUIDE  10/07/2021   LATISSIMUS FLAP TO BREAST Right 09/24/2013   Procedure: RIGHT LATISSMUS MYOCUTAEIOUS MUSCLE FLAP AND PLACEMENT OF TISSUE LESLEIGH;  Surgeon: Estefana Reichert, DO;  Location: [facility] OR;  Service: Plastics;  Laterality: Right;   LIPOSUCTION WITH LIPOFILLING Bilateral 12/12/2013   Procedure: LIPOSUCTION WITH LIPOFILLING;  Surgeon: Estefana Reichert, DO;  Location: [facility];  Service: Plastics;  Laterality: Bilateral;   PORT-A-CATH REMOVAL Left 12/12/2013   Procedure: REMOVAL PORT-A-CATH;  Surgeon: Estefana Reichert, DO;  Location:  SURGERY CENTER;  Service: Plastics;  Laterality: Left;   PORTACATH PLACEMENT  07/14/2012   Procedure: INSERTION PORT-A-CATH;  Surgeon: Debby LABOR. Cornett, MD;  Location: [facility] OR;  Service: General;  Laterality: Left;   RECONSTRUCTION BREAST W/ LATISSIMUS DORSI FLAP Right 09/24/2013   & tissue expander placement   REMOVAL OF BILATERAL TISSUE EXPANDERS WITH PLACEMENT OF BILATERAL BREAST IMPLANTS Bilateral 12/12/2013   Procedure: REMOVAL  OF BILATERAL TISSUE EXPANDERS WITH PLACEMENT OF BILATERAL BREAST IMPLANTS/BILATERAL CAPSULECTOMIES WITH  LIPOFILLING FAT GRAFTING;  Surgeon: Estefana Reichert, DO;  Location: [facility];  Service: Plastics;  Laterality: Bilateral;   SIMPLE MASTECTOMY WITH AXILLARY SENTINEL NODE BIOPSY  07/14/2012   Procedure: SIMPLE MASTECTOMY WITH AXILLARY SENTINEL NODE BIOPSY;  Surgeon: Debby LABOR. Cornett, MD;  Location: [facility];  Service:  General;  Laterality: Right;  Bilateral simple mastectomy with port and right sebtibel lymph node mapping   SIMPLE MASTECTOMY WITH AXILLARY SENTINEL NODE BIOPSY  07/14/2012   Procedure: SIMPLE MASTECTOMY;  Surgeon: Debby LABOR. Cornett, MD;  Location: [facility];  Service: General;  Laterality: Left;   TISSUE EXPANDER PLACEMENT Left 09/24/2013   Procedure: PLACEMENT OF TISSUE EXPANDER AND FLEX HD TO LEFT BREAST;  Surgeon: Estefana Reichert, DO;  Location: [facility];  Service: Plastics;  Laterality: Left;    SOCIAL HISTORY: Social History   Socioeconomic History   Marital status: Married    Spouse name: Not on file   Number of children: 2   Years of education: Not on file   Highest education level: Not on file  Occupational History    Employer: [employer]  Tobacco Use   Smoking status: Never   Smokeless tobacco: Never  Substance and Sexual Activity   Alcohol use: Yes    Comment: occasional   Drug use: No   Sexual activity: Yes    Birth control/protection: Surgical  Other Topics Concern   Not on file  Social History Narrative   Not on file   Social Drivers of Health   Financial Resource Strain: Not on file  Food Insecurity: No Food Insecurity (12/11/2023)   Hunger Vital Sign    Worried About Running Out of Food in the Last Year: Never true    Ran Out of Food in the Last Year: Never true  Transportation Needs: No Transportation Needs (12/11/2023)   PRAPARE - Administrator, Civil Service (Medical): No    Lack of Transportation (Non-Medical): No  Physical Activity: Not on file  Stress: Not on file  Social Connections: Not on file  Intimate Partner Violence: Not At Risk (12/11/2023)   Humiliation, Afraid, Rape, and Kick questionnaire    Fear of Current or Ex-Partner: No    Emotionally Abused: No    Physically Abused: No    Sexually Abused: No    FAMILY HISTORY: Family History  Problem Relation Age of Onset   Hypertension Mother    Alcohol abuse Mother    Heart disease  Maternal Grandmother    Stroke Maternal Grandfather    Colon cancer Maternal Aunt 55       alive at 30   Brain cancer Maternal Uncle 80       and lymphoma in early 78s; deceased   Brain cancer Maternal Uncle 60       deceased   Pancreatic cancer Maternal Uncle 45       alive at 63   Breast cancer Cousin 60       mat 1st cousin once removed through mat GF ; deceased   Breast cancer Maternal Aunt        great aunt through mat GF; dx at ? age    Review of Systems  Constitutional:  Negative for appetite change, chills, fatigue, fever and unexpected weight change.  HENT:   Negative for hearing loss, lump/mass and trouble swallowing.   Eyes:  Negative for eye problems and icterus.  Respiratory:  Negative for chest tightness, cough and shortness of breath.   Cardiovascular:  Negative for chest pain, leg swelling and palpitations.  Gastrointestinal:  Negative for abdominal distention, abdominal pain, constipation, diarrhea, nausea and vomiting.  Endocrine: Negative for hot flashes.  Genitourinary:  Negative for difficulty urinating.   Musculoskeletal:  Negative for arthralgias.  Skin:  Negative for itching and rash.  Neurological:  Negative for dizziness, extremity weakness, headaches and numbness.  Hematological:  Negative for adenopathy. Does not bruise/bleed easily.  Psychiatric/Behavioral:  Negative for depression. The patient is not nervous/anxious.       PHYSICAL EXAMINATION      Vitals:   05/17/24 1244  BP: (!) 142/94  Pulse: (!) 108  Resp: 18  Temp: 98.2 F (36.8 C)  SpO2: 99%    Physical Exam Constitutional:      General: She is not in acute distress.    Appearance: Normal appearance. She is not toxic-appearing.  HENT:     Head: Normocephalic and atraumatic.     Mouth/Throat:     Mouth: Mucous membranes are moist.     Pharynx: Oropharynx is clear. No oropharyngeal exudate or posterior oropharyngeal erythema.  Eyes:     General: No scleral  icterus. Cardiovascular:     Rate and Rhythm: Normal rate and regular rhythm.     Pulses: Normal pulses.     Heart sounds: Normal heart sounds.  Pulmonary:     Effort: Pulmonary effort is normal.     Breath sounds: Normal breath sounds.  Abdominal:     General: Abdomen is flat. Bowel sounds are normal. There is no distension.     Palpations: Abdomen is soft.     Tenderness: There is no abdominal tenderness.  Musculoskeletal:        General: No swelling.     Cervical back: Neck supple.  Lymphadenopathy:     Cervical: No cervical adenopathy.  Skin:    General: Skin is warm and dry.     Findings: No rash.  Neurological:     General: No focal deficit present.     Mental Status: She is alert.  Psychiatric:        Mood and Affect: Mood normal.        Behavior: Behavior normal.     LABORATORY DATA:  CBC    Component Value Date/Time   WBC 4.9 04/26/2024 1207   WBC 11.1 (H) 12/12/2023 0353   RBC 4.63 04/26/2024 1207   HGB 13.6 04/26/2024 1207   HGB 15.9 10/30/2019 1030   HGB 14.7 02/15/2017 0852   HCT 39.2 04/26/2024 1207   HCT 46.1 10/30/2019 1030   HCT 42.9 02/15/2017 0852   PLT 324 04/26/2024 1207   PLT 245 10/30/2019 1030   MCV 84.7 04/26/2024 1207   MCV 88 10/30/2019 1030   MCV 86.7 02/15/2017 0852   MCH 29.4 04/26/2024 1207   MCHC 34.7 04/26/2024 1207   RDW 14.5 04/26/2024 1207   RDW 12.5 10/30/2019 1030   RDW 12.6 02/15/2017 0852   LYMPHSABS 1.5 04/26/2024 1207   LYMPHSABS 1.4 02/15/2017 0852   MONOABS 0.5 04/26/2024 1207   MONOABS 0.3 02/15/2017 0852   EOSABS 0.2 04/26/2024 1207   EOSABS 0.1 02/15/2017 0852   BASOSABS 0.0 04/26/2024 1207   BASOSABS 0.0 02/15/2017 0852    CMP     Component Value Date/Time   NA 142 04/26/2024 1207   NA 140 10/30/2019 1030   NA 141 02/15/2017 0852   K 3.9  04/26/2024 1207   K 3.9 02/15/2017 0852   CL 109 04/26/2024 1207   CL 101 11/15/2012 0904   CO2 27 04/26/2024 1207   CO2 25 02/15/2017 0852   GLUCOSE 98  04/26/2024 1207   GLUCOSE 87 02/15/2017 0852   GLUCOSE 69 (L) 11/15/2012 0904   BUN 16 04/26/2024 1207   BUN 11 10/30/2019 1030   BUN 12.5 02/15/2017 0852   CREATININE 0.71 04/26/2024 1207   CREATININE 0.9 02/15/2017 0852   CALCIUM 9.1 04/26/2024 1207   CALCIUM 9.3 02/15/2017 0852   PROT 6.7 04/26/2024 1207   PROT 6.7 10/30/2019 1030   PROT 7.0 02/15/2017 0852   ALBUMIN 3.9 04/26/2024 1207   ALBUMIN 4.2 10/30/2019 1030   ALBUMIN 3.6 02/15/2017 0852   AST 19 04/26/2024 1207   AST 16 02/15/2017 0852   ALT 13 04/26/2024 1207   ALT 13 02/15/2017 0852   ALKPHOS 54 04/26/2024 1207   ALKPHOS 63 02/15/2017 0852   BILITOT 0.3 04/26/2024 1207   BILITOT 0.69 02/15/2017 0852   GFRNONAA >60 04/26/2024 1207   GFRAA 100 10/30/2019 1030     ASSESSMENT and THERAPY PLAN:   Assessment and Plan Assessment & Plan  Metastatic breast cancer on enhertu  No new symptoms.No concerns on exam - Ordered repeat staging for end of December or January. - CBC reviewed and satisfactory. CMP pending - Ok to continue enhertu  if labs are within parameters.  Fatigue Persistent fatigue without additional symptoms. Normal blood counts.  Indefinite anticoagulation, she had PVT  All questions were answered. The patient knows to call the clinic with any problems, questions or concerns. We can certainly see the patient much sooner if necessary.  Total encounter time:20 minutes*in face-to-face visit time, chart review, lab review, care coordination, order entry, and documentation of the encounter time.  *Total Encounter Time as defined by the Centers for Medicare and Medicaid Services includes, in addition to the face-to-face time of a patient visit (documented in the note above) non-face-to-face time: obtaining and reviewing outside history, ordering and reviewing medications, tests or procedures, care coordination (communications with other health care professionals or caregivers) and documentation in the  medical record.

## 2024-05-18 ENCOUNTER — Other Ambulatory Visit: Payer: Self-pay

## 2024-06-05 MED FILL — Fosaprepitant Dimeglumine For IV Infusion 150 MG (Base Eq): INTRAVENOUS | Qty: 5 | Status: AC

## 2024-06-05 NOTE — Progress Notes (Signed)
 [facility] Cancer Follow up:    Sherry Barry PARAS, MD [address] [place] [address]   DIAGNOSIS:  Cancer Staging  Malignant neoplasm of upper-inner quadrant of right breast in female, estrogen receptor positive (HCC) Staging form: Breast, AJCC 7th Edition - Pathologic stage from 07/14/2012: Stage IV (TX, NX, M1) - Signed by [name], MD on 08/28/2021 Specimen type: Core Needle Biopsy Histopathologic type: 9931 Laterality: Right   SUMMARY OF ONCOLOGIC HISTORY: Oncology History  Malignant neoplasm of upper-inner quadrant of right breast in female, estrogen receptor positive (HCC)  07/13/2012 Clinical Stage   Stage IA: T1c N0   07/14/2012 Definitive Surgery   Bilateral mastectomy/right SLNB: RIGHT invasive ductal carcinoma, grade 3, ER+, PR +, Her2/neu positive (ratio 3.02), Ki67 48%. DCIS. 1/4 LN positive for malignancy. LEFT: benign   07/14/2012 Pathologic Stage   Stage IIB: T2 N1a M0   07/14/2012 Cancer Staging   Staging form: Breast, AJCC 7th Edition - Pathologic stage from 07/14/2012: Stage IV (TX, NX, M1) - Signed by [name], MD on 08/28/2021 Specimen type: Core Needle Biopsy Histopathologic type: 9931 Laterality: Right   08/17/2012 - 08/30/2013 Chemotherapy   Adjuvant carboplatin , docetaxel , and trastuzumab  x 6 cycles (completed 11/30/2012) followed by maintenance trastuzumab  to total one year   11/2012 Procedure   Comp Cancer Gene panel (GeneDx) negative for deleterious mutations    - 02/2013 Radiation Therapy   Adjuvant RT to right breast   02/2013 -  Anti-estrogen oral therapy   Tamoxifen  20 mg daily   09/24/2013 Surgery   Bilateral breast reconstruction with latissmus flap and expander placement   12/12/2013 Surgery   Implant placement    Relapse/Recurrence   Left sided malignant pleural effusion.  Tumor cells are positive for GATA3 and ER, negative for TTF-1 consistent with recurrent breast carcinoma Prognostic showed ER 90%  positive strong staining, PR 80% positive strong staining, HER2 negative.    09/16/2021 Imaging   PET scan showed malignant left pleural effusion with extensive areas of nodularity and hypermetabolic activity involving the mediastinal border and pericardium as well as the most inferior aspects of the costodiaphragmatic recess.  Signs of nodal disease at the thoracic inlet on the left suspect extension of disease below the diaphragm along the left anterolateral aorta.  Nonspecific moderate to markedly increased metabolic activity about the base of the tongue bilaterally.  Consider direct visualization showing mildly asymmetric uptake favoring the right lingual tonsil.  Sclerotic foci without marked increased metabolic activity suspicious for metastatic disease perhaps treated or questions.  Heterogeneous marrow uptake seen elsewhere generalized and nonspecific.  Consider spinal MRI is warranted  MRI brain without any evidence of intracranial metastatic disease.   10/09/2021 - 02/11/2022 Chemotherapy   Taxotere , Herceptin , Perjeta  x 6   12/04/2021 Imaging   There is no evidence new metastatic disease. There is interval decrease in the left pleural effusion possibly suggesting resolving pleural metastatic disease. Few scattered sclerotic metastatic lesions in the skeletal structures have not changed significantly.   No acute findings are seen in the chest abdomen and pelvis.     02/22/2022 -  Antibody Plan   Herceptin /Perjeta  every 3 weeks   03/21/2022 Imaging   CT chest abdomen pelvis  IMPRESSION: 1. No significant change since previous study. Again demonstrated is a chronic small left pleural effusion with basilar atelectasis and scattered sclerotic skeletal metastases. 2. No acute abnormalities are demonstrated. 3. Small esophageal hiatal hernia.     Electronically Signed   By:  [name] M.D.   On: 03/21/2022 20:23   07/22/2023 -  Chemotherapy   Patient is on Treatment Plan :  BREAST Fam-Trastuzumab Deruxtecan-nxki  (Enhertu ) (5.4) q21d       CURRENT THERAPY:  Enhertu  q 21 days  INTERVAL HISTORY:  Sherry Barry 53 y.o. female returns for follow-up prior to next cycle of Enhertu  therapy today.  She is unaccompanied for this visit.  She reports noticing more fatigue but adds that she has been very active including hosting Thanksgiving.  She has a good appetite and has noted 3 pound weight gain since the last visit.  She reports having nausea periodically throughout the day which does improve with her prescribed antiemetics.  She denies vomiting or bowel habit changes.  She denies easy bruising or signs of active bleeding.  She denies fevers, chills, night sweats or shortness of breath, chest pain or cough.  Rest of the 10 point ROS as below.  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  MEDICAL HISTORY: Past Medical History:  Diagnosis Date   Allergy    Breast cancer (HCC)    Breast cancer (HCC)    right   History of chemotherapy    doxetaxel/carboplatin /trastuzumab    History of migraine    last one about a week ago   Hx of radiation therapy 01/01/13- 02/15/13   r chest wall, r supraclav/axilla 5040 cGy/28 sessions, scar boost 1000 cGy/5  sessions   Hypertension    no meds,   urgent care on pomana   Migraine    Migraine     SURGICAL HISTORY: Past Surgical History:  Procedure Laterality Date   ABDOMINAL HYSTERECTOMY     no salpingo-oophorectomy 2009   BREAST RECONSTRUCTION WITH PLACEMENT OF TISSUE EXPANDER AND FLEX HD (ACELLULAR HYDRATED DERMIS) Left 09/24/2013   IR IMAGING GUIDED PORT INSERTION  10/07/2021   IR THORACENTESIS ASP PLEURAL SPACE W/IMG GUIDE  08/11/2021   IR THORACENTESIS ASP PLEURAL SPACE W/IMG GUIDE  10/07/2021   LATISSIMUS FLAP TO BREAST Right 09/24/2013   Procedure: RIGHT LATISSMUS MYOCUTAEIOUS MUSCLE FLAP AND PLACEMENT OF TISSUE LESLEIGH;  Surgeon: Estefana Reichert, DO;  Location: [facility];  Service: Plastics;  Laterality: Right;   LIPOSUCTION WITH LIPOFILLING Bilateral 12/12/2013   Procedure: LIPOSUCTION WITH LIPOFILLING;  Surgeon: Estefana Reichert, DO;  Location: [facility];  Service: Plastics;  Laterality: Bilateral;   PORT-A-CATH REMOVAL Left 12/12/2013   Procedure: REMOVAL PORT-A-CATH;  Surgeon: Estefana Reichert, DO;  Location: [facility];  Service: Plastics;  Laterality: Left;   PORTACATH PLACEMENT  07/14/2012   Procedure: INSERTION PORT-A-CATH;  Surgeon: Debby LABOR. Cornett, MD;  Location: [facility];  Service: General;  Laterality: Left;   RECONSTRUCTION BREAST W/ LATISSIMUS DORSI FLAP Right 09/24/2013   & tissue expander placement   REMOVAL OF BILATERAL TISSUE EXPANDERS WITH PLACEMENT OF BILATERAL BREAST IMPLANTS Bilateral  12/12/2013   Procedure: REMOVAL OF BILATERAL TISSUE EXPANDERS WITH PLACEMENT OF BILATERAL BREAST IMPLANTS/BILATERAL CAPSULECTOMIES WITH  LIPOFILLING FAT GRAFTING;  Surgeon: Estefana Reichert, DO;  Location: [facility];  Service: Plastics;  Laterality: Bilateral;   SIMPLE MASTECTOMY WITH AXILLARY SENTINEL NODE BIOPSY  07/14/2012   Procedure: SIMPLE MASTECTOMY WITH AXILLARY SENTINEL NODE BIOPSY;  Surgeon: Debby LABOR. Cornett, MD;  Location: [facility];  Service: General;   Laterality: Right;  Bilateral simple mastectomy with port and right sebtibel lymph node mapping   SIMPLE MASTECTOMY WITH AXILLARY SENTINEL NODE BIOPSY  07/14/2012   Procedure: SIMPLE MASTECTOMY;  Surgeon: Debby LABOR. Cornett, MD;  Location: [facility];  Service: General;  Laterality: Left;   TISSUE EXPANDER PLACEMENT Left 09/24/2013   Procedure: PLACEMENT OF TISSUE EXPANDER AND FLEX HD TO LEFT BREAST;  Surgeon: Estefana Reichert, DO;  Location: [facility];  Service: Plastics;  Laterality: Left;    SOCIAL HISTORY: Social History   Socioeconomic History   Marital status: Married    Spouse name: Not on file   Number of children: 2   Years of education: Not on file   Highest education level: Not on file  Occupational History    Employer: [employer]  Tobacco Use   Smoking status: Never   Smokeless tobacco: Never  Substance and Sexual Activity   Alcohol use: Yes    Comment: occasional   Drug use: No   Sexual activity: Yes    Birth control/protection: Surgical  Other Topics Concern   Not on file  Social History Narrative   Not on file   Social Drivers of Health   Tobacco Use: Low Risk (05/17/2024)   Patient History    Smoking Tobacco Use: Never    Smokeless Tobacco Use: Never    Passive Exposure: Not on file  Financial Resource Strain: Not on file  Food Insecurity: No Food Insecurity (12/11/2023)   Epic    Worried About Programme Researcher, Broadcasting/film/video in the Last Year: Never true    Ran Out of Food in the Last Year: Never true  Transportation Needs: No Transportation Needs (12/11/2023)   Epic    Lack of Transportation (Medical): No    Lack of Transportation (Non-Medical): No  Physical Activity: Not on file  Stress: Not on file  Social Connections: Not on file  Intimate Partner Violence: Not At Risk (12/11/2023)   Epic    Fear of Current or Ex-Partner: No    Emotionally Abused: No    Physically Abused: No    Sexually Abused: No  Depression (PHQ2-9): Low Risk (06/06/2024)   Depression (PHQ2-9)     PHQ-2 Score: 0  Alcohol Screen: Not on file  Housing: Low Risk (12/11/2023)   Epic    Unable to Pay for Housing in the Last Year: No    Number of Times Moved in the Last Year: 0    Homeless in the Last Year: No  Utilities: Not At Risk (12/11/2023)   Epic    Threatened with loss of utilities: No  Health Literacy: Not on file    FAMILY HISTORY: Family History  Problem Relation Age of Onset   Hypertension Mother    Alcohol abuse Mother    Heart disease Maternal Grandmother    Stroke Maternal Grandfather    Colon cancer Maternal Aunt 55       alive at 10   Brain cancer Maternal Uncle 11       and lymphoma in early 37s; deceased  Brain cancer Maternal Uncle 60       deceased   Pancreatic cancer Maternal Uncle 45       alive at 67   Breast cancer Cousin 74       mat 1st cousin once removed through mat GF ; deceased   Breast cancer Maternal Aunt        great aunt through mat GF; dx at ? age    Review of Systems  Constitutional:  Positive for fatigue. Negative for appetite change, chills, fever and unexpected weight change.  HENT:   Negative for hearing loss, lump/mass and trouble swallowing.   Eyes:  Negative for eye problems and icterus.  Respiratory:  Negative for chest tightness and cough.   Cardiovascular:  Negative for chest pain, leg swelling and palpitations.  Gastrointestinal:  Positive for nausea. Negative for abdominal distention, abdominal pain, constipation, diarrhea and vomiting.  Endocrine: Negative for hot flashes.  Genitourinary:  Negative for difficulty urinating.   Musculoskeletal:  Negative for arthralgias.  Skin:  Negative for itching and rash.  Neurological:  Negative for dizziness, extremity weakness, headaches and numbness.  Hematological:  Negative for adenopathy. Does not bruise/bleed easily.  Psychiatric/Behavioral:  Negative for depression. The patient is not nervous/anxious.       PHYSICAL EXAMINATION  ECOG PERFORMANCE STATUS: 1 - Symptomatic  but completely ambulatory  Vitals:   06/06/24 0827  BP: (!) 120/91  Pulse: 88  Resp: 17  Temp: 97.7 F (36.5 C)  SpO2: 100%      Physical Exam Constitutional:      General: She is not in acute distress.    Appearance: Normal appearance. She is not toxic-appearing.  HENT:     Head: Normocephalic and atraumatic.  Eyes:     General: No scleral icterus. Cardiovascular:     Rate and Rhythm: Normal rate and regular rhythm.     Heart sounds: Normal heart sounds.  Pulmonary:     Effort: Pulmonary effort is normal.     Breath sounds: Normal breath sounds.  Musculoskeletal:        General: No swelling.     Cervical back: Neck supple.  Lymphadenopathy:     Cervical: No cervical adenopathy.  Skin:    General: Skin is warm and dry.     Findings: No rash.  Neurological:     General: No focal deficit present.     Mental Status: She is alert.  Psychiatric:        Mood and Affect: Mood normal.        Behavior: Behavior normal.     LABORATORY DATA:  CBC    Component Value Date/Time   WBC 4.7 06/06/2024 0812   WBC 11.1 (H) 12/12/2023 0353   RBC 4.23 06/06/2024 0812   HGB 12.6 06/06/2024 0812   HGB 15.9 10/30/2019 1030   HGB 14.7 02/15/2017 0852   HCT 36.0 06/06/2024 0812   HCT 46.1 10/30/2019 1030   HCT 42.9 02/15/2017 0852   PLT 274 06/06/2024 0812   PLT 245 10/30/2019 1030   MCV 85.1 06/06/2024 0812   MCV 88 10/30/2019 1030   MCV 86.7 02/15/2017 0852   MCH 29.8 06/06/2024 0812   MCHC 35.0 06/06/2024 0812   RDW 15.6 (H) 06/06/2024 0812   RDW 12.5 10/30/2019 1030   RDW 12.6 02/15/2017 0852   LYMPHSABS 1.1 06/06/2024 0812   LYMPHSABS 1.4 02/15/2017 0852   MONOABS 0.6 06/06/2024 0812   MONOABS 0.3 02/15/2017 0852   EOSABS 0.2 06/06/2024  0812   EOSABS 0.1 02/15/2017 0852   BASOSABS 0.0 06/06/2024 0812   BASOSABS 0.0 02/15/2017 0852    CMP     Component Value Date/Time   NA 142 06/06/2024 0812   NA 140 10/30/2019 1030   NA 141 02/15/2017 0852   K 4.1  06/06/2024 0812   K 3.9 02/15/2017 0852   CL 108 06/06/2024 0812   CL 101 11/15/2012 0904   CO2 24 06/06/2024 0812   CO2 25 02/15/2017 0852   GLUCOSE 97 06/06/2024 0812   GLUCOSE 87 02/15/2017 0852   GLUCOSE 69 (L) 11/15/2012 0904   BUN 11 06/06/2024 0812   BUN 11 10/30/2019 1030   BUN 12.5 02/15/2017 0852   CREATININE 0.81 06/06/2024 0812   CREATININE 0.9 02/15/2017 0852   CALCIUM 9.3 06/06/2024 0812   CALCIUM 9.3 02/15/2017 0852   PROT 6.5 06/06/2024 0812   PROT 6.7 10/30/2019 1030   PROT 7.0 02/15/2017 0852   ALBUMIN 4.0 06/06/2024 0812   ALBUMIN 4.2 10/30/2019 1030   ALBUMIN 3.6 02/15/2017 0852   AST 37 06/06/2024 0812   AST 16 02/15/2017 0852   ALT 31 06/06/2024 0812   ALT 13 02/15/2017 0852   ALKPHOS 54 06/06/2024 0812   ALKPHOS 63 02/15/2017 0852   BILITOT 0.4 06/06/2024 0812   BILITOT 0.69 02/15/2017 0852   GFRNONAA >60 06/06/2024 0812   GFRAA 100 10/30/2019 1030     ASSESSMENT and THERAPY PLAN:  Felesha Barry is a 53 y.o. female who returns for a follow-up for stage IV triple positive breast cancer.  #Stage IV triple positive breast cancer: --Currently treatment includes Enhertu  treatment, started on 07/22/2023.  --Most recent CT scan from 03/01/2024 showed slight decrease in size of left sided pulmonary/pleural nodues with remaining disease is stable. No evidence of new or progressive metastatic disease.  PLAN: --Due for Cycle 16 of Trastuzumab  plus Pertuzumab . --Labs today show WBC 4.7, Hgb 12.6, Plt 274, creatinine and LFTs are normal.  --Next CT scan scheduled for 06/11/2024. Under the care of cardiology who recommends repeat echo in 6 months.  --Most recent echo was 02/22/2024 with EF 60-65%.  --RTC in 3 weeks with labs and follow up before Cycle 17  #Nausea: --Current regimen includes zofran  q 8 hours as needed, compazine  q 6 hours as needed and zyprexa  nightly.   #Fatigue: --Could be multifactorial secondary to treatment and underlying  malignancy. --Will check nutritional labs, vitamin D  and thyroid  panel to further evaluate.    All questions were answered. The patient knows to call the clinic with any problems, questions or concerns. We can certainly see the patient much sooner if necessary.  I have spent a total of 30 minutes minutes of face-to-face and non-face-to-face time, preparing to see the patient,performing a medically appropriate examination, counseling and educating the patient, ordering medications/tests, documenting clinical information in the electronic health record,  and care coordination.   Johnston Police PA-C Dept of Hematology and Oncology [facility] Phone: [phone]

## 2024-06-06 ENCOUNTER — Inpatient Hospital Stay: Admitting: Physician Assistant

## 2024-06-06 ENCOUNTER — Inpatient Hospital Stay

## 2024-06-06 VITALS — BP 120/91 | HR 88 | Temp 97.7°F | Resp 17 | Wt 169.1 lb

## 2024-06-06 DIAGNOSIS — R5383 Other fatigue: Secondary | ICD-10-CM

## 2024-06-06 DIAGNOSIS — C50211 Malignant neoplasm of upper-inner quadrant of right female breast: Secondary | ICD-10-CM

## 2024-06-06 DIAGNOSIS — Z170 Estrogen receptor positive status [ER+]: Secondary | ICD-10-CM

## 2024-06-06 DIAGNOSIS — T451X5A Adverse effect of antineoplastic and immunosuppressive drugs, initial encounter: Secondary | ICD-10-CM

## 2024-06-06 DIAGNOSIS — R110 Nausea: Secondary | ICD-10-CM

## 2024-06-06 LAB — CMP (CANCER CENTER ONLY)
ALT: 31 U/L (ref 0–44)
AST: 37 U/L (ref 15–41)
Albumin: 4 g/dL (ref 3.5–5.0)
Alkaline Phosphatase: 54 U/L (ref 38–126)
Anion gap: 10 (ref 5–15)
BUN: 11 mg/dL (ref 6–20)
CO2: 24 mmol/L (ref 22–32)
Calcium: 9.3 mg/dL (ref 8.9–10.3)
Chloride: 108 mmol/L (ref 98–111)
Creatinine: 0.81 mg/dL (ref 0.44–1.00)
GFR, Estimated: >60 mL/min
Glucose, Bld: 97 mg/dL (ref 70–99)
Potassium: 4.1 mmol/L (ref 3.5–5.1)
Sodium: 142 mmol/L (ref 135–145)
Total Bilirubin: 0.4 mg/dL (ref 0.0–1.2)
Total Protein: 6.5 g/dL (ref 6.5–8.1)

## 2024-06-06 LAB — CBC WITH DIFFERENTIAL (CANCER CENTER ONLY)
Abs Immature Granulocytes: 0.02 K/uL (ref 0.00–0.07)
Basophils Absolute: 0 K/uL (ref 0.0–0.1)
Basophils Relative: 1 %
Eosinophils Absolute: 0.2 K/uL (ref 0.0–0.5)
Eosinophils Relative: 5 %
HCT: 36 % (ref 36.0–46.0)
Hemoglobin: 12.6 g/dL (ref 12.0–15.0)
Immature Granulocytes: 0 %
Lymphocytes Relative: 24 %
Lymphs Abs: 1.1 K/uL (ref 0.7–4.0)
MCH: 29.8 pg (ref 26.0–34.0)
MCHC: 35 g/dL (ref 30.0–36.0)
MCV: 85.1 fL (ref 80.0–100.0)
Monocytes Absolute: 0.6 K/uL (ref 0.1–1.0)
Monocytes Relative: 13 %
Neutro Abs: 2.7 K/uL (ref 1.7–7.7)
Neutrophils Relative %: 57 %
Platelet Count: 274 K/uL (ref 150–400)
RBC: 4.23 MIL/uL (ref 3.87–5.11)
RDW: 15.6 % — ABNORMAL HIGH (ref 11.5–15.5)
WBC Count: 4.7 K/uL (ref 4.0–10.5)
nRBC: 0 % (ref 0.0–0.2)

## 2024-06-06 LAB — FOLATE: Folate: 19.2 ng/mL

## 2024-06-06 LAB — TSH: TSH: 1.31 u[IU]/mL (ref 0.350–4.500)

## 2024-06-06 LAB — IRON AND IRON BINDING CAPACITY (CC-WL,HP ONLY)
Iron: 53 ug/dL (ref 28–170)
Saturation Ratios: 15 % (ref 10.4–31.8)
TIBC: 347 ug/dL (ref 250–450)
UIBC: 294 ug/dL

## 2024-06-06 LAB — FERRITIN: Ferritin: 90 ng/mL (ref 11–307)

## 2024-06-06 LAB — VITAMIN B12: Vitamin B-12: 916 pg/mL — ABNORMAL HIGH (ref 180–914)

## 2024-06-06 LAB — T4, FREE: Free T4: 1.32 ng/dL (ref 0.80–2.00)

## 2024-06-06 LAB — VITAMIN D 25 HYDROXY (VIT D DEFICIENCY, FRACTURES): Vit D, 25-Hydroxy: 117 ng/mL — ABNORMAL HIGH (ref 30–100)

## 2024-06-06 MED ORDER — FAM-TRASTUZUMAB DERUXTECAN-NXKI CHEMO 100 MG IV SOLR
5.4000 mg/kg | Freq: Once | INTRAVENOUS | Status: AC
  Administered 2024-06-06: 340 mg via INTRAVENOUS
  Filled 2024-06-06: qty 17

## 2024-06-06 MED ORDER — PALONOSETRON HCL INJECTION 0.25 MG/5ML
0.2500 mg | Freq: Once | INTRAVENOUS | Status: AC
  Administered 2024-06-06: 0.25 mg via INTRAVENOUS
  Filled 2024-06-06: qty 5

## 2024-06-06 MED ORDER — DEXAMETHASONE SOD PHOSPHATE PF 10 MG/ML IJ SOLN
10.0000 mg | Freq: Once | INTRAMUSCULAR | Status: AC
  Administered 2024-06-06: 10 mg via INTRAVENOUS

## 2024-06-06 MED ORDER — DEXTROSE 5 % IV SOLN: INTRAVENOUS | Status: DC

## 2024-06-06 MED ORDER — DIPHENHYDRAMINE HCL 25 MG PO CAPS
25.0000 mg | ORAL_CAPSULE | Freq: Once | ORAL | Status: AC
  Administered 2024-06-06: 25 mg via ORAL
  Filled 2024-06-06: qty 1

## 2024-06-06 MED ORDER — PROCHLORPERAZINE MALEATE 10 MG PO TABS: 10.0000 mg | ORAL_TABLET | Freq: Four times a day (QID) | ORAL | 1 refills | Status: AC | PRN

## 2024-06-06 MED ORDER — ONDANSETRON HCL 8 MG PO TABS: 8.0000 mg | ORAL_TABLET | Freq: Three times a day (TID) | ORAL | 1 refills | Status: AC | PRN

## 2024-06-06 MED ORDER — ACETAMINOPHEN 325 MG PO TABS
650.0000 mg | ORAL_TABLET | Freq: Once | ORAL | Status: AC
  Administered 2024-06-06: 650 mg via ORAL
  Filled 2024-06-06: qty 2

## 2024-06-06 MED ORDER — OLANZAPINE 5 MG PO TABS
5.0000 mg | ORAL_TABLET | Freq: Once | ORAL | Status: AC
  Administered 2024-06-06: 5 mg via ORAL
  Filled 2024-06-06: qty 1

## 2024-06-06 MED ORDER — SODIUM CHLORIDE 0.9 % IV SOLN
150.0000 mg | Freq: Once | INTRAVENOUS | Status: AC
  Administered 2024-06-06: 150 mg via INTRAVENOUS
  Filled 2024-06-06: qty 150

## 2024-06-06 NOTE — Progress Notes (Signed)
 Per Johnston Police, PA, OK to treat today with echocardiogram from 02/22/24.  Per PA, pt cleared by cardiology to have echocardiograms done every 6 months.

## 2024-06-06 NOTE — Patient Instructions (Signed)

## 2024-06-11 ENCOUNTER — Ambulatory Visit (HOSPITAL_COMMUNITY)
Admission: RE | Admit: 2024-06-11 | Discharge: 2024-06-11 | Disposition: A | Source: Ambulatory Visit | Attending: Hematology and Oncology

## 2024-06-11 ENCOUNTER — Ambulatory Visit: Payer: Self-pay

## 2024-06-11 DIAGNOSIS — Z170 Estrogen receptor positive status [ER+]: Secondary | ICD-10-CM | POA: Insufficient documentation

## 2024-06-11 DIAGNOSIS — R911 Solitary pulmonary nodule: Secondary | ICD-10-CM | POA: Diagnosis not present

## 2024-06-11 DIAGNOSIS — J90 Pleural effusion, not elsewhere classified: Secondary | ICD-10-CM | POA: Diagnosis not present

## 2024-06-11 DIAGNOSIS — K449 Diaphragmatic hernia without obstruction or gangrene: Secondary | ICD-10-CM | POA: Diagnosis not present

## 2024-06-11 DIAGNOSIS — C50911 Malignant neoplasm of unspecified site of right female breast: Secondary | ICD-10-CM | POA: Diagnosis not present

## 2024-06-11 DIAGNOSIS — C50211 Malignant neoplasm of upper-inner quadrant of right female breast: Secondary | ICD-10-CM | POA: Diagnosis not present

## 2024-06-11 MED ORDER — IOHEXOL 300 MG/ML  SOLN
100.0000 mL | Freq: Once | INTRAMUSCULAR | Status: AC | PRN
  Administered 2024-06-11: 100 mL via INTRAVENOUS

## 2024-06-11 MED ORDER — HEPARIN SOD (PORK) LOCK FLUSH 100 UNIT/ML IV SOLN
500.0000 [IU] | Freq: Once | INTRAVENOUS | Status: AC
  Administered 2024-06-11: 500 [IU] via INTRAVENOUS

## 2024-06-11 MED ORDER — HEPARIN SOD (PORK) LOCK FLUSH 100 UNIT/ML IV SOLN
INTRAVENOUS | Status: AC
  Filled 2024-06-11: qty 5

## 2024-06-11 NOTE — Telephone Encounter (Signed)
 Pt advised with VU with no further questions or concerns

## 2024-06-11 NOTE — Telephone Encounter (Signed)
-----   Message from Johnston ONE[place] Police sent at 06/11/2024  9:33 AM EST ----- Please notify patient that labs show no evidence thyroid  dysfunction, vitamin B12, folate or iron deficiencies.

## 2024-06-15 ENCOUNTER — Other Ambulatory Visit: Payer: Self-pay | Admitting: Hematology and Oncology

## 2024-06-15 DIAGNOSIS — C50211 Malignant neoplasm of upper-inner quadrant of right female breast: Secondary | ICD-10-CM

## 2024-06-19 ENCOUNTER — Other Ambulatory Visit: Payer: Self-pay

## 2024-06-20 ENCOUNTER — Other Ambulatory Visit: Payer: Self-pay

## 2024-06-27 ENCOUNTER — Telehealth: Payer: Self-pay

## 2024-06-27 NOTE — Telephone Encounter (Signed)
 Spoke with pt regarding her Accommodation form being completed and faxed. Pt copy was emailed as requested.

## 2024-06-28 ENCOUNTER — Inpatient Hospital Stay

## 2024-06-28 ENCOUNTER — Inpatient Hospital Stay: Admitting: Hematology and Oncology

## 2024-06-28 ENCOUNTER — Inpatient Hospital Stay (HOSPITAL_BASED_OUTPATIENT_CLINIC_OR_DEPARTMENT_OTHER): Admitting: Hematology and Oncology

## 2024-06-28 ENCOUNTER — Inpatient Hospital Stay: Attending: Hematology and Oncology

## 2024-06-28 VITALS — BP 136/94 | HR 99 | Temp 98.1°F | Resp 16 | Wt 166.4 lb

## 2024-06-28 DIAGNOSIS — Z923 Personal history of irradiation: Secondary | ICD-10-CM | POA: Insufficient documentation

## 2024-06-28 DIAGNOSIS — C50211 Malignant neoplasm of upper-inner quadrant of right female breast: Secondary | ICD-10-CM | POA: Insufficient documentation

## 2024-06-28 DIAGNOSIS — C7951 Secondary malignant neoplasm of bone: Secondary | ICD-10-CM | POA: Diagnosis present

## 2024-06-28 DIAGNOSIS — Z807 Family history of other malignant neoplasms of lymphoid, hematopoietic and related tissues: Secondary | ICD-10-CM | POA: Diagnosis not present

## 2024-06-28 DIAGNOSIS — Z1732 Human epidermal growth factor receptor 2 negative status: Secondary | ICD-10-CM | POA: Insufficient documentation

## 2024-06-28 DIAGNOSIS — Z1721 Progesterone receptor positive status: Secondary | ICD-10-CM | POA: Insufficient documentation

## 2024-06-28 DIAGNOSIS — Z803 Family history of malignant neoplasm of breast: Secondary | ICD-10-CM | POA: Insufficient documentation

## 2024-06-28 DIAGNOSIS — Z90710 Acquired absence of both cervix and uterus: Secondary | ICD-10-CM | POA: Insufficient documentation

## 2024-06-28 DIAGNOSIS — Z86718 Personal history of other venous thrombosis and embolism: Secondary | ICD-10-CM | POA: Diagnosis not present

## 2024-06-28 DIAGNOSIS — Z79810 Long term (current) use of selective estrogen receptor modulators (SERMs): Secondary | ICD-10-CM | POA: Insufficient documentation

## 2024-06-28 DIAGNOSIS — Z170 Estrogen receptor positive status [ER+]: Secondary | ICD-10-CM

## 2024-06-28 DIAGNOSIS — Z800 Family history of malignant neoplasm of digestive organs: Secondary | ICD-10-CM | POA: Insufficient documentation

## 2024-06-28 DIAGNOSIS — Z9013 Acquired absence of bilateral breasts and nipples: Secondary | ICD-10-CM | POA: Insufficient documentation

## 2024-06-28 DIAGNOSIS — I81 Portal vein thrombosis: Secondary | ICD-10-CM | POA: Diagnosis not present

## 2024-06-28 DIAGNOSIS — Z9221 Personal history of antineoplastic chemotherapy: Secondary | ICD-10-CM | POA: Diagnosis not present

## 2024-06-28 DIAGNOSIS — Z5112 Encounter for antineoplastic immunotherapy: Secondary | ICD-10-CM | POA: Diagnosis present

## 2024-06-28 LAB — CBC WITH DIFFERENTIAL (CANCER CENTER ONLY)
Abs Immature Granulocytes: 0.02 K/uL (ref 0.00–0.07)
Basophils Absolute: 0 K/uL (ref 0.0–0.1)
Basophils Relative: 1 %
Eosinophils Absolute: 0.3 K/uL (ref 0.0–0.5)
Eosinophils Relative: 5 %
HCT: 39.1 % (ref 36.0–46.0)
Hemoglobin: 13.8 g/dL (ref 12.0–15.0)
Immature Granulocytes: 0 %
Lymphocytes Relative: 28 %
Lymphs Abs: 1.6 K/uL (ref 0.7–4.0)
MCH: 30.1 pg (ref 26.0–34.0)
MCHC: 35.3 g/dL (ref 30.0–36.0)
MCV: 85.4 fL (ref 80.0–100.0)
Monocytes Absolute: 0.5 K/uL (ref 0.1–1.0)
Monocytes Relative: 9 %
Neutro Abs: 3.3 K/uL (ref 1.7–7.7)
Neutrophils Relative %: 57 %
Platelet Count: 334 K/uL (ref 150–400)
RBC: 4.58 MIL/uL (ref 3.87–5.11)
RDW: 15.6 % — ABNORMAL HIGH (ref 11.5–15.5)
WBC Count: 5.7 K/uL (ref 4.0–10.5)
nRBC: 0 % (ref 0.0–0.2)

## 2024-06-28 LAB — CMP (CANCER CENTER ONLY)
ALT: 25 U/L (ref 0–44)
AST: 31 U/L (ref 15–41)
Albumin: 4.4 g/dL (ref 3.5–5.0)
Alkaline Phosphatase: 61 U/L (ref 38–126)
Anion gap: 14 (ref 5–15)
BUN: 10 mg/dL (ref 6–20)
CO2: 23 mmol/L (ref 22–32)
Calcium: 9.7 mg/dL (ref 8.9–10.3)
Chloride: 106 mmol/L (ref 98–111)
Creatinine: 0.66 mg/dL (ref 0.44–1.00)
GFR, Estimated: >60 mL/min
Glucose, Bld: 98 mg/dL (ref 70–99)
Potassium: 3.5 mmol/L (ref 3.5–5.1)
Sodium: 143 mmol/L (ref 135–145)
Total Bilirubin: 0.4 mg/dL (ref 0.0–1.2)
Total Protein: 7.4 g/dL (ref 6.5–8.1)

## 2024-06-28 MED ORDER — ZOLEDRONIC ACID 4 MG/100ML IV SOLN
4.0000 mg | Freq: Once | INTRAVENOUS | Status: AC
  Administered 2024-06-28: 4 mg via INTRAVENOUS
  Filled 2024-06-28: qty 100

## 2024-06-28 MED ORDER — OLANZAPINE 5 MG PO TABS
5.0000 mg | ORAL_TABLET | Freq: Once | ORAL | Status: AC
  Administered 2024-06-28: 5 mg via ORAL
  Filled 2024-06-28: qty 1

## 2024-06-28 MED ORDER — DIPHENHYDRAMINE HCL 25 MG PO CAPS
25.0000 mg | ORAL_CAPSULE | Freq: Once | ORAL | Status: AC
  Administered 2024-06-28: 25 mg via ORAL
  Filled 2024-06-28: qty 1

## 2024-06-28 MED ORDER — ACETAMINOPHEN 325 MG PO TABS
650.0000 mg | ORAL_TABLET | Freq: Once | ORAL | Status: AC
  Administered 2024-06-28: 650 mg via ORAL
  Filled 2024-06-28: qty 2

## 2024-06-28 MED ORDER — SODIUM CHLORIDE 0.9% FLUSH
10.0000 mL | Freq: Once | INTRAVENOUS | Status: AC
  Administered 2024-06-28: 10 mL

## 2024-06-28 MED ORDER — DEXTROSE 5 % IV SOLN: INTRAVENOUS | Status: DC

## 2024-06-28 MED ORDER — PALONOSETRON HCL INJECTION 0.25 MG/5ML
0.2500 mg | Freq: Once | INTRAVENOUS | Status: AC
  Administered 2024-06-28: 0.25 mg via INTRAVENOUS
  Filled 2024-06-28: qty 5

## 2024-06-28 MED ORDER — FAM-TRASTUZUMAB DERUXTECAN-NXKI CHEMO 100 MG IV SOLR
5.4000 mg/kg | Freq: Once | INTRAVENOUS | Status: AC
  Administered 2024-06-28: 340 mg via INTRAVENOUS
  Filled 2024-06-28: qty 17

## 2024-06-28 MED ORDER — DEXAMETHASONE SOD PHOSPHATE PF 10 MG/ML IJ SOLN
10.0000 mg | Freq: Once | INTRAMUSCULAR | Status: AC
  Administered 2024-06-28: 10 mg via INTRAVENOUS
  Filled 2024-06-28: qty 1

## 2024-06-28 MED ORDER — SODIUM CHLORIDE 0.9 % IV SOLN
150.0000 mg | Freq: Once | INTRAVENOUS | Status: AC
  Administered 2024-06-28: 150 mg via INTRAVENOUS
  Filled 2024-06-28: qty 150

## 2024-06-28 NOTE — Patient Instructions (Signed)

## 2024-06-28 NOTE — Progress Notes (Signed)
  [facility] Center Cancer Follow up:    [name], MD [address] [place] [address]   DIAGNOSIS:  Cancer Staging  Malignant neoplasm of upper-inner quadrant of right breast in female, estrogen receptor positive (HCC) Staging form: Breast, AJCC 7th Edition - Pathologic stage from 07/14/2012: Stage IV (TX, NX, M1) - Signed by [name], MD on 08/28/2021 Specimen type: Core Needle Biopsy Histopathologic type: 9931 Laterality: Right    SUMMARY OF ONCOLOGIC HISTORY: Oncology History  Malignant neoplasm of upper-inner quadrant of right breast in female, estrogen receptor positive (HCC)  07/13/2012 Clinical Stage   Stage IA: T1c N0   07/14/2012 Definitive Surgery   Bilateral mastectomy/right SLNB: RIGHT invasive ductal carcinoma, grade 3, ER+, PR +, Her2/neu positive (ratio 3.02), Ki67 48%. DCIS. 1/4 LN positive for malignancy. LEFT: benign   07/14/2012 Pathologic Stage   Stage IIB: T2 N1a M0   07/14/2012 Cancer Staging   Staging form: Breast, AJCC 7th Edition - Pathologic stage from 07/14/2012: Stage IV (TX, NX, M1) - Signed by [name], MD on 08/28/2021 Specimen type: Core Needle Biopsy Histopathologic type: 9931 Laterality: Right   08/17/2012 - 08/30/2013 Chemotherapy   Adjuvant carboplatin , docetaxel , and trastuzumab  x 6 cycles (completed 11/30/2012) followed by maintenance trastuzumab  to total one year   11/2012 Procedure   Comp Cancer Gene panel (Gene[facility]x) negative for deleterious mutations    - 02/2013 Radiation Therapy   Adjuvant RT to right breast   02/2013 -  Anti-estrogen oral therapy   Tamoxifen  20 mg daily   09/24/2013 Surgery   Bilateral breast reconstruction with latissmus flap and expander placement   12/12/2013 Surgery   Implant placement    Relapse/Recurrence   Left sided malignant pleural effusion.  Tumor cells are positive for GATA3 and ER, negative for TTF-1 consistent with recurrent breast carcinoma Prognostic showed ER 90%  positive strong staining, PR 80% positive strong staining, HER2 negative.    09/16/2021 Imaging   PET scan showed malignant left pleural effusion with extensive areas of nodularity and hypermetabolic activity involving the mediastinal border and pericardium as well as the most inferior aspects of the costodiaphragmatic recess.  Signs of nodal disease at the thoracic inlet on the left suspect extension of disease below the diaphragm along the left anterolateral aorta.  Nonspecific moderate to markedly increased metabolic activity about the base of the tongue bilaterally.  Consider direct visualization showing mildly asymmetric uptake favoring the right lingual tonsil.  Sclerotic foci without marked increased metabolic activity suspicious for metastatic disease perhaps treated or questions.  Heterogeneous marrow uptake seen elsewhere generalized and nonspecific.  Consider spinal MRI is warranted  MRI brain without any evidence of intracranial metastatic disease.   10/09/2021 - 02/11/2022 Chemotherapy   Taxotere , Herceptin , Perjeta  x 6   12/04/2021 Imaging   There is no evidence new metastatic disease. There is interval decrease in the left pleural effusion possibly suggesting resolving pleural metastatic disease. Few scattered sclerotic metastatic lesions in the skeletal structures have not changed significantly.   No acute findings are seen in the chest abdomen and pelvis.     02/22/2022 -  Antibody Plan   Herceptin /Perjeta  every 3 weeks   03/21/2022 Imaging   CT chest abdomen pelvis  IMPRESSION: 1. No significant change since previous study. Again demonstrated is a chronic small left pleural effusion with basilar atelectasis and scattered sclerotic skeletal metastases. 2. No acute abnormalities are demonstrated. 3. Small esophageal hiatal hernia.     Electronically Signed  By: [name] M.D.   On: 03/21/2022 20:23   07/22/2023 -  Chemotherapy   Patient is on Treatment Plan :  BREAST Fam-Trastuzumab Deruxtecan-nxki  (Enhertu ) (5.4) q21d       CURRENT THERAPY: Enhertu   INTERVAL HISTORY:  History of Present Illness  Sherry Barry is a 54 year old female with chronic portal vein thrombosis presenting for routine oncology follow-up and review of recent imaging.  She describes mild fatigue today, which she attributes to poor sleep, and endorses ongoing work-related stress and occasional anxiety. She denies chest pain, dyspnea, abnormal bleeding, or ecchymosis.  Recent CT imaging of the chest, abdomen, and pelvis from December 29 demonstrates stable findings.   Rest of the pertinent 10 point ROS reviewed and neg.   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  MEDICAL HISTORY: Past Medical History:  Diagnosis Date   Allergy    Breast cancer (HCC)    Breast cancer (HCC)    right   History of chemotherapy    doxetaxel/carboplatin /trastuzumab    History of migraine    last one about a week ago   Hx of radiation therapy 01/01/13- 02/15/13   r chest wall, r supraclav/axilla 5040 cGy/28 sessions, scar boost 1000 cGy/5 sessions   Hypertension    no meds,   urgent care on pomana   Migraine    Migraine     SURGICAL HISTORY: Past Surgical  History:  Procedure Laterality Date   ABDOMINAL HYSTERECTOMY     no salpingo-oophorectomy 2009   BREAST RECONSTRUCTION WITH PLACEMENT OF TISSUE EXPANDER AND FLEX HD (ACELLULAR HYDRATED DERMIS) Left 09/24/2013   IR IMAGING GUIDED PORT INSERTION  10/07/2021   IR THORACENTESIS RIGHT ASP PLEURAL SPACE W/IMG GUIDE  08/11/2021   IR THORACENTESIS RIGHT ASP PLEURAL SPACE W/IMG GUIDE  10/07/2021   LATISSIMUS FLAP TO BREAST Right 09/24/2013   Procedure: RIGHT LATISSMUS MYOCUTAEIOUS MUSCLE FLAP AND PLACEMENT OF TISSUE LESLEIGH;  Surgeon: Estefana Reichert, DO;  Location: [facility];  Service: Plastics;  Laterality: Right;   LIPOSUCTION WITH LIPOFILLING Bilateral 12/12/2013   Procedure: LIPOSUCTION WITH LIPOFILLING;  Surgeon: Estefana Reichert, DO;  Location: [facility];  Service: Plastics;  Laterality: Bilateral;   PORT-A-CATH REMOVAL Left 12/12/2013   Procedure: REMOVAL PORT-A-CATH;  Surgeon: Estefana Reichert, DO;  Location: [facility];  Service: Plastics;  Laterality: Left;   PORTACATH PLACEMENT  07/14/2012   Procedure: INSERTION PORT-A-CATH;  Surgeon: Debby LABOR. Cornett, MD;  Location: [facility];  Service: General;  Laterality: Left;   RECONSTRUCTION BREAST W/ LATISSIMUS DORSI FLAP Right 09/24/2013   & tissue expander placement   REMOVAL OF BILATERAL TISSUE EXPANDERS WITH PLACEMENT OF BILATERAL BREAST IMPLANTS Bilateral 12/12/2013   Procedure: REMOVAL OF BILATERAL TISSUE EXPANDERS WITH PLACEMENT OF BILATERAL BREAST IMPLANTS/BILATERAL CAPSULECTOMIES WITH  LIPOFILLING FAT GRAFTING;  Surgeon: Estefana Reichert, DO;  Location: [facility];  Service: Government Social Research Officer;  Laterality: Bilateral;   SIMPLE MASTECTOMY WITH AXILLARY SENTINEL NODE BIOPSY  07/14/2012   Procedure: SIMPLE MASTECTOMY WITH AXILLARY SENTINEL NODE BIOPSY;  Surgeon: Debby LABOR. Cornett, MD;  Location: [facility];  Service: General;  Laterality: Right;  Bilateral simple mastectomy with port and right sebtibel lymph node mapping   SIMPLE MASTECTOMY  WITH AXILLARY SENTINEL NODE BIOPSY  07/14/2012   Procedure: SIMPLE MASTECTOMY;  Surgeon: Debby LABOR. Cornett, MD;  Location: [facility];  Service: General;  Laterality: Left;   TISSUE EXPANDER PLACEMENT Left 09/24/2013   Procedure: PLACEMENT OF TISSUE EXPANDER AND FLEX HD TO LEFT BREAST;  Surgeon: Estefana Reichert, DO;  Location: [facility];  Service: Plastics;  Laterality: Left;    SOCIAL HISTORY: Social History   Socioeconomic History   Marital status: Married    Spouse name: Not on file   Number of children: 2   Years of education: Not on file   Highest education level: Not on file  Occupational History    Employer: [employer]  Tobacco Use   Smoking status: Never   Smokeless tobacco: Never  Substance and Sexual Activity   Alcohol use: Yes    Comment: occasional   Drug use: No   Sexual activity: Yes    Birth control/protection: Surgical  Other Topics Concern   Not on file  Social History Narrative   Not on file   Social Drivers of Health   Tobacco Use: Low Risk (05/17/2024)   Patient History    Smoking Tobacco Use: Never    Smokeless Tobacco Use: Never    Passive Exposure: Not on file  Financial Resource Strain: Not on file  Food Insecurity: No Food Insecurity (12/11/2023)   Epic    Worried About Programme Researcher, Broadcasting/film/video in the Last Year: Never true    Ran Out of Food in the Last Year: Never true  Transportation Needs: No Transportation Needs (12/11/2023)   Epic    Lack of Transportation (Medical): No    Lack of Transportation (Non-Medical): No  Physical Activity: Not on file  Stress: Not on file  Social Connections: Not on file  Intimate Partner Violence: Not At Risk (12/11/2023)   Epic    Fear of Current or Ex-Partner: No    Emotionally Abused: No    Physically Abused: No    Sexually Abused: No  Depression (PHQ2-9): Low Risk (06/06/2024)   Depression (PHQ2-9)    PHQ-2 Score: 0  Alcohol Screen: Not on file  Housing: Low Risk (12/11/2023)   Epic    Unable to Pay for Housing  in the Last Year: No    Number of Times Moved in the Last Year: 0    Homeless in the Last Year: No  Utilities: Not At Risk (12/11/2023)   Epic    Threatened with loss of utilities: No  Health Literacy: Not on file    FAMILY HISTORY: Family History  Problem Relation Age of Onset   Hypertension Mother    Alcohol abuse Mother    Heart disease Maternal Grandmother    Stroke Maternal Grandfather    Colon cancer Maternal Aunt 55       alive at 11   Brain cancer Maternal Uncle 71       and lymphoma in early 17s; deceased   Brain cancer Maternal Uncle 60       deceased   Pancreatic cancer Maternal Uncle 45       alive at 96   Breast cancer  Cousin 65       mat 1st cousin once removed through mat GF ; deceased   Breast cancer Maternal Aunt        great aunt through mat GF; dx at ? age    Review of Systems  Constitutional:  Negative for appetite change, chills, fatigue, fever and unexpected weight change.  HENT:   Negative for hearing loss, lump/mass and trouble swallowing.   Eyes:  Negative for eye problems and icterus.  Respiratory:  Negative for chest tightness, cough and shortness of breath.   Cardiovascular:  Negative for chest pain, leg swelling and palpitations.  Gastrointestinal:  Negative for abdominal distention, abdominal pain, constipation, diarrhea, nausea and vomiting.  Endocrine: Negative for hot flashes.  Genitourinary:  Negative for difficulty urinating.   Musculoskeletal:  Negative for arthralgias.  Skin:  Negative for itching and rash.  Neurological:  Negative for dizziness, extremity weakness, headaches and numbness.  Hematological:  Negative for adenopathy. Does not bruise/bleed easily.  Psychiatric/Behavioral:  Negative for depression. The patient is not nervous/anxious.       PHYSICAL EXAMINATION      Vitals:   06/28/24 1350  BP: (!) 136/94  Pulse: 99  Resp: 16  Temp: 98.1 F (36.7 C)  SpO2: 99%     Physical Exam Constitutional:       General: She is not in acute distress.    Appearance: Normal appearance. She is not toxic-appearing.  HENT:     Head: Normocephalic and atraumatic.     Mouth/Throat:     Mouth: Mucous membranes are moist.     Pharynx: Oropharynx is clear. No oropharyngeal exudate or posterior oropharyngeal erythema.  Eyes:     General: No scleral icterus. Cardiovascular:     Rate and Rhythm: Normal rate and regular rhythm.     Pulses: Normal pulses.     Heart sounds: Normal heart sounds.  Pulmonary:     Effort: Pulmonary effort is normal.     Breath sounds: Normal breath sounds.  Abdominal:     General: Abdomen is flat. Bowel sounds are normal. There is no distension.     Palpations: Abdomen is soft.     Tenderness: There is no abdominal tenderness.  Musculoskeletal:        General: No swelling.     Cervical back: Neck supple.  Lymphadenopathy:     Cervical: No cervical adenopathy.  Skin:    General: Skin is warm and dry.     Findings: No rash.  Neurological:     General: No focal deficit present.     Mental Status: She is alert.  Psychiatric:        Mood and Affect: Mood normal.        Behavior: Behavior normal.     LABORATORY DATA:  CBC    Component Value Date/Time   WBC 5.7 06/28/2024 1251   WBC 11.1 (H) 12/12/2023 0353   RBC 4.58 06/28/2024 1251   HGB 13.8 06/28/2024 1251   HGB 15.9 10/30/2019 1030   HGB 14.7 02/15/2017 0852   HCT 39.1 06/28/2024 1251   HCT 46.1 10/30/2019 1030   HCT 42.9 02/15/2017 0852   PLT 334 06/28/2024 1251   PLT 245 10/30/2019 1030   MCV 85.4 06/28/2024 1251   MCV 88 10/30/2019 1030   MCV 86.7 02/15/2017 0852   MCH 30.1 06/28/2024 1251   MCHC 35.3 06/28/2024 1251   RDW 15.6 (H) 06/28/2024 1251   RDW 12.5 10/30/2019 1030   RDW  12.6 02/15/2017 0852   LYMPHSABS 1.6 06/28/2024 1251   LYMPHSABS 1.4 02/15/2017 0852   MONOABS 0.5 06/28/2024 1251   MONOABS 0.3 02/15/2017 0852   EOSABS 0.3 06/28/2024 1251   EOSABS 0.1 02/15/2017 0852   BASOSABS 0.0  06/28/2024 1251   BASOSABS 0.0 02/15/2017 0852    CMP     Component Value Date/Time   NA 143 06/28/2024 1251   NA 140 10/30/2019 1030   NA 141 02/15/2017 0852   K 3.5 06/28/2024 1251   K 3.9 02/15/2017 0852   CL 106 06/28/2024 1251   CL 101 11/15/2012 0904   CO2 23 06/28/2024 1251   CO2 25 02/15/2017 0852   GLUCOSE 98 06/28/2024 1251   GLUCOSE 87 02/15/2017 0852   GLUCOSE 69 (L) 11/15/2012 0904   BUN 10 06/28/2024 1251   BUN 11 10/30/2019 1030   BUN 12.5 02/15/2017 0852   CREATININE 0.66 06/28/2024 1251   CREATININE 0.9 02/15/2017 0852   CALCIUM 9.7 06/28/2024 1251   CALCIUM 9.3 02/15/2017 0852   PROT 7.4 06/28/2024 1251   PROT 6.7 10/30/2019 1030   PROT 7.0 02/15/2017 0852   ALBUMIN 4.4 06/28/2024 1251   ALBUMIN 4.2 10/30/2019 1030   ALBUMIN 3.6 02/15/2017 0852   AST 31 06/28/2024 1251   AST 16 02/15/2017 0852   ALT 25 06/28/2024 1251   ALT 13 02/15/2017 0852   ALKPHOS 61 06/28/2024 1251   ALKPHOS 63 02/15/2017 0852   BILITOT 0.4 06/28/2024 1251   BILITOT 0.69 02/15/2017 0852   GFRNONAA >60 06/28/2024 1251   GFRAA 100 10/30/2019 1030     ASSESSMENT and THERAPY PLAN:   Assessment and Plan Assessment & Plan  Metastatic breast cancer on enhertu  No new symptoms.No concerns on exam Most recent imaging with no concerns for progression. ROS and PE unremarkable.  Chronic portal vein thrombosis Chronic thrombosis in the splenoportal confluence with stable collateral circulation and no new complications on recent CT. Continue anticoagulation  All questions were answered. The patient knows to call the clinic with any problems, questions or concerns. We can certainly see the patient much sooner if necessary.  Total encounter time:20 minutes*in face-to-face visit time, chart review, lab review, care coordination, order entry, and documentation of the encounter time.  *Total Encounter Time as defined by the Centers for Medicare and Medicaid Services includes, in  addition to the face-to-face time of a patient visit (documented in the note above) non-face-to-face time: obtaining and reviewing outside history, ordering and reviewing medications, tests or procedures, care coordination (communications with other health care professionals or caregivers) and documentation in the medical record.

## 2024-06-29 ENCOUNTER — Other Ambulatory Visit: Payer: Self-pay

## 2024-06-30 ENCOUNTER — Other Ambulatory Visit: Payer: Self-pay

## 2024-07-03 ENCOUNTER — Other Ambulatory Visit: Payer: Self-pay

## 2024-07-04 ENCOUNTER — Other Ambulatory Visit: Payer: Self-pay

## 2024-07-10 ENCOUNTER — Other Ambulatory Visit: Payer: Self-pay

## 2024-07-11 ENCOUNTER — Other Ambulatory Visit: Payer: Self-pay

## 2024-07-18 MED FILL — Fosaprepitant Dimeglumine For IV Infusion 150 MG (Base Eq): INTRAVENOUS | Qty: 5 | Status: AC

## 2024-07-19 ENCOUNTER — Inpatient Hospital Stay: Attending: Hematology and Oncology

## 2024-07-19 ENCOUNTER — Inpatient Hospital Stay

## 2024-07-19 ENCOUNTER — Inpatient Hospital Stay: Attending: Hematology and Oncology | Admitting: Hematology and Oncology

## 2024-07-19 VITALS — BP 126/82 | HR 120 | Temp 97.9°F | Resp 17 | Ht 62.5 in | Wt 167.4 lb

## 2024-07-19 VITALS — HR 99

## 2024-07-19 DIAGNOSIS — C50211 Malignant neoplasm of upper-inner quadrant of right female breast: Secondary | ICD-10-CM

## 2024-07-19 DIAGNOSIS — Z170 Estrogen receptor positive status [ER+]: Secondary | ICD-10-CM

## 2024-07-19 LAB — CBC WITH DIFFERENTIAL (CANCER CENTER ONLY)
Abs Immature Granulocytes: 0.02 10*3/uL (ref 0.00–0.07)
Basophils Absolute: 0 10*3/uL (ref 0.0–0.1)
Basophils Relative: 1 %
Eosinophils Absolute: 0.2 10*3/uL (ref 0.0–0.5)
Eosinophils Relative: 4 %
HCT: 38.8 % (ref 36.0–46.0)
Hemoglobin: 13.7 g/dL (ref 12.0–15.0)
Immature Granulocytes: 0 %
Lymphocytes Relative: 31 %
Lymphs Abs: 1.7 10*3/uL (ref 0.7–4.0)
MCH: 30.2 pg (ref 26.0–34.0)
MCHC: 35.3 g/dL (ref 30.0–36.0)
MCV: 85.7 fL (ref 80.0–100.0)
Monocytes Absolute: 0.6 10*3/uL (ref 0.1–1.0)
Monocytes Relative: 12 %
Neutro Abs: 2.8 10*3/uL (ref 1.7–7.7)
Neutrophils Relative %: 52 %
Platelet Count: 331 10*3/uL (ref 150–400)
RBC: 4.53 MIL/uL (ref 3.87–5.11)
RDW: 15.2 % (ref 11.5–15.5)
WBC Count: 5.4 10*3/uL (ref 4.0–10.5)
nRBC: 0 % (ref 0.0–0.2)

## 2024-07-19 LAB — CMP (CANCER CENTER ONLY)
ALT: 21 U/L (ref 0–44)
AST: 33 U/L (ref 15–41)
Albumin: 4.3 g/dL (ref 3.5–5.0)
Alkaline Phosphatase: 64 U/L (ref 38–126)
Anion gap: 13 (ref 5–15)
BUN: 13 mg/dL (ref 6–20)
CO2: 22 mmol/L (ref 22–32)
Calcium: 9.3 mg/dL (ref 8.9–10.3)
Chloride: 106 mmol/L (ref 98–111)
Creatinine: 0.69 mg/dL (ref 0.44–1.00)
GFR, Estimated: >60 mL/min
Glucose, Bld: 92 mg/dL (ref 70–99)
Potassium: 3.6 mmol/L (ref 3.5–5.1)
Sodium: 142 mmol/L (ref 135–145)
Total Bilirubin: 0.3 mg/dL (ref 0.0–1.2)
Total Protein: 7.2 g/dL (ref 6.5–8.1)

## 2024-07-19 MED ORDER — FAM-TRASTUZUMAB DERUXTECAN-NXKI CHEMO 100 MG IV SOLR
300.0000 mg | Freq: Once | INTRAVENOUS | Status: AC
  Administered 2024-07-19: 300 mg via INTRAVENOUS
  Filled 2024-07-19: qty 15

## 2024-07-19 MED ORDER — DEXAMETHASONE SOD PHOSPHATE PF 10 MG/ML IJ SOLN
10.0000 mg | Freq: Once | INTRAMUSCULAR | Status: AC
  Administered 2024-07-19: 10 mg via INTRAVENOUS
  Filled 2024-07-19: qty 1

## 2024-07-19 MED ORDER — SODIUM CHLORIDE 0.9 % IV SOLN
150.0000 mg | Freq: Once | INTRAVENOUS | Status: AC
  Administered 2024-07-19: 150 mg via INTRAVENOUS
  Filled 2024-07-19: qty 150

## 2024-07-19 MED ORDER — DEXTROSE 5 % IV SOLN: INTRAVENOUS | Status: DC

## 2024-07-19 MED ORDER — PALONOSETRON HCL INJECTION 0.25 MG/5ML
0.2500 mg | Freq: Once | INTRAVENOUS | Status: AC
  Administered 2024-07-19: 0.25 mg via INTRAVENOUS
  Filled 2024-07-19: qty 5

## 2024-07-19 MED ORDER — OLANZAPINE 5 MG PO TABS
5.0000 mg | ORAL_TABLET | Freq: Once | ORAL | Status: AC
  Administered 2024-07-19: 5 mg via ORAL
  Filled 2024-07-19: qty 1

## 2024-07-19 MED ORDER — ACETAMINOPHEN 325 MG PO TABS
650.0000 mg | ORAL_TABLET | Freq: Once | ORAL | Status: AC
  Administered 2024-07-19: 650 mg via ORAL
  Filled 2024-07-19: qty 2

## 2024-07-19 MED ORDER — DIPHENHYDRAMINE HCL 25 MG PO CAPS
25.0000 mg | ORAL_CAPSULE | Freq: Once | ORAL | Status: AC
  Administered 2024-07-19: 25 mg via ORAL
  Filled 2024-07-19: qty 1

## 2024-07-19 NOTE — Progress Notes (Signed)
 MD would like to decrease Enhertu  dose due to pt reported fatigue. Patient has been receiving 340mg . Decrease to 4 mg/kg with updated weight for total dose of 300mg  per MD.  Bridgett Leotis Helling, RPH, BCPS, BCOP 07/19/2024 3:26 PM

## 2024-07-19 NOTE — Progress Notes (Signed)
 [facility] Cancer Follow up:    Sherry Artist PARAS, MD [address] [place] [address]   DIAGNOSIS:  Cancer Staging  Malignant neoplasm of upper-inner quadrant of right breast in female, estrogen receptor positive (HCC) Staging form: Breast, AJCC 7th Edition - Pathologic stage from 07/14/2012: Stage IV (TX, NX, M1) - Signed by [name], MD on 08/28/2021 Specimen type: Core Needle Biopsy Histopathologic type: 9931 Laterality: Right    SUMMARY OF ONCOLOGIC HISTORY: Oncology History  Malignant neoplasm of upper-inner quadrant of right breast in female, estrogen receptor positive (HCC)  07/13/2012 Clinical Stage   Stage IA: T1c N0   07/14/2012 Definitive Surgery   Bilateral mastectomy/right SLNB: RIGHT invasive ductal carcinoma, grade 3, ER+, PR +, Her2/neu positive (ratio 3.02), Ki67 48%. DCIS. 1/4 LN positive for malignancy. LEFT: benign   07/14/2012 Pathologic Stage   Stage IIB: T2 N1a M0   07/14/2012 Cancer Staging   Staging form: Breast, AJCC 7th Edition - Pathologic stage from 07/14/2012: Stage IV (TX, NX, M1) - Signed by [name], MD on 08/28/2021 Specimen type: Core Needle Biopsy Histopathologic type: 9931 Laterality: Right   08/17/2012 - 08/30/2013 Chemotherapy   Adjuvant carboplatin , docetaxel , and trastuzumab  x 6 cycles (completed 11/30/2012) followed by maintenance trastuzumab  to total one year   11/2012 Procedure   Comp Cancer Gene panel (GeneDx) negative for deleterious mutations    - 02/2013 Radiation Therapy   Adjuvant RT to right breast   02/2013 -  Anti-estrogen oral therapy   Tamoxifen  20 mg daily   09/24/2013 Surgery   Bilateral breast reconstruction with latissmus flap and expander placement   12/12/2013 Surgery   Implant placement    Relapse/Recurrence   Left sided malignant pleural effusion.  Tumor cells are positive for GATA3 and ER, negative for TTF-1 consistent with recurrent breast carcinoma Prognostic showed ER 90%  positive strong staining, PR 80% positive strong staining, HER2 negative.    09/16/2021 Imaging   PET scan showed malignant left pleural effusion with extensive areas of nodularity and hypermetabolic activity involving the mediastinal border and pericardium as well as the most inferior aspects of the costodiaphragmatic recess.  Signs of nodal disease at the thoracic inlet on the left suspect extension of disease below the diaphragm along the left anterolateral aorta.  Nonspecific moderate to markedly increased metabolic activity about the base of the tongue bilaterally.  Consider direct visualization showing mildly asymmetric uptake favoring the right lingual tonsil.  Sclerotic foci without marked increased metabolic activity suspicious for metastatic disease perhaps treated or questions.  Heterogeneous marrow uptake seen elsewhere generalized and nonspecific.  Consider spinal MRI is warranted  MRI brain without any evidence of intracranial metastatic disease.   10/09/2021 - 02/11/2022 Chemotherapy   Taxotere , Herceptin , Perjeta  x 6   12/04/2021 Imaging   There is no evidence new metastatic disease. There is interval decrease in the left pleural effusion possibly suggesting resolving pleural metastatic disease. Few scattered sclerotic metastatic lesions in the skeletal structures have not changed significantly.   No acute findings are seen in the chest abdomen and pelvis.     02/22/2022 -  Antibody Plan   Herceptin /Perjeta  every 3 weeks   03/21/2022 Imaging   CT chest abdomen pelvis  IMPRESSION: 1. No significant change since previous study. Again demonstrated is a chronic small left pleural effusion with basilar atelectasis and scattered sclerotic skeletal metastases. 2. No acute abnormalities are demonstrated. 3. Small esophageal hiatal hernia.     Electronically Signed  By: [name] M.D.   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  She is currently receiving Fam-Trastuzumab Deruxtecan-nxki  (Enhertu ) 5.4 mg/kg IV every 21 days for HER2+ metastatic breast cancer, along with tamoxifen  for hormone receptor positivity and anticoagulation for chronic portal vein thrombosis.  She describes persistent and significant fatigue, characterized by profound tiredness, frequent yawning, and tearing. This fatigue is ongoing and interferes with daily activities. She denies new or worsening symptoms aside from fatigue. Nausea occurs for approximately one week following each infusion and then resolves. She denies diarrhea, constipation, or changes in bowel habits. No new dyspnea, and her heart rate remains mildly elevated as is typical for her. She denies diaphoresis. Appetite and other systemic symptoms are stable.  She remains adherent to full-dose anticoagulation therapy. Cardiology follow-up is ongoing with regular echocardiograms, and no new cardiovascular symptoms have developed.  Jun 28, 2024: Routine oncology follow-up and review of recent imaging. Patient with metastatic ER+ breast cancer on Enhertu  and tamoxifen ; imaging from 06/11/2024 showed stable disease with no new metastatic findings. Mild fatigue attributed to poor sleep and work stress, no new symptoms or concerns, physical exam and review of systems unremarkable.   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   Tachycardia 12/10/2021   Chemotherapy induced diarrhea 10/16/2021   Malignant pleural effusion (HCC) 10/16/2021   CAP (community acquired pneumonia)  08/10/2021   Breast asymmetry following reconstructive surgery 09/09/2015   Status post bilateral breast reconstruction 12/20/2013   Acquired absence of bilateral breasts and nipples 09/24/2013   History of breast cancer 08/17/2013   Anxiety 06/28/2013   Eczema 06/28/2013   Malignant neoplasm of upper-inner quadrant of right breast in female, estrogen receptor positive (HCC) 06/20/2012   Essential hypertension 05/02/2012   Migraine 04/17/2012    is allergic to codeine, hydrocodone , latex, lisinopril , and tomato.  MEDICAL HISTORY: Past Medical History:  Diagnosis Date   Allergy    Breast cancer (HCC)    Breast cancer (HCC)    right   History of chemotherapy    doxetaxel/carboplatin /trastuzumab    History of migraine    last one about a week ago   Hx of radiation therapy 01/01/13- 02/15/13   r chest wall, r supraclav/axilla 5040 cGy/28 sessions, scar boost 1000 cGy/5 sessions   Hypertension    no meds,   urgent care on pomana   Migraine    Migraine     SURGICAL HISTORY: Past Surgical History:  Procedure Laterality Date   ABDOMINAL HYSTERECTOMY     no salpingo-oophorectomy 2009   BREAST RECONSTRUCTION WITH PLACEMENT OF TISSUE EXPANDER AND FLEX HD (ACELLULAR HYDRATED DERMIS) Left 09/24/2013   IR IMAGING GUIDED PORT INSERTION  10/07/2021   IR THORACENTESIS RIGHT ASP PLEURAL SPACE W/IMG GUIDE  08/11/2021   IR THORACENTESIS RIGHT ASP PLEURAL SPACE W/IMG GUIDE  10/07/2021   LATISSIMUS FLAP TO BREAST Right 09/24/2013   Procedure: RIGHT LATISSMUS MYOCUTAEIOUS MUSCLE FLAP AND PLACEMENT OF TISSUE LESLEIGH;  Surgeon: Estefana Reichert, DO;  Location: [facility];  Service: Plastics;  Laterality: Right;   LIPOSUCTION WITH  LIPOFILLING Bilateral 12/12/2013   Procedure: LIPOSUCTION WITH LIPOFILLING;  Surgeon: Estefana Reichert, DO;  Location: [facility];  Service: Plastics;  Laterality: Bilateral;   PORT-A-CATH REMOVAL Left 12/12/2013   Procedure: REMOVAL PORT-A-CATH;  Surgeon: Estefana Reichert, DO;   Location: [facility];  Service: Plastics;  Laterality: Left;   PORTACATH PLACEMENT  07/14/2012   Procedure: INSERTION PORT-A-CATH;  Surgeon: Debby LABOR. Cornett, MD;  Location: [facility];  Service: General;  Laterality: Left;   RECONSTRUCTION BREAST W/ LATISSIMUS DORSI FLAP Right 09/24/2013   & tissue expander placement   REMOVAL OF BILATERAL TISSUE EXPANDERS WITH PLACEMENT OF BILATERAL BREAST IMPLANTS Bilateral 12/12/2013   Procedure: REMOVAL OF BILATERAL TISSUE EXPANDERS WITH PLACEMENT OF BILATERAL BREAST IMPLANTS/BILATERAL CAPSULECTOMIES WITH  LIPOFILLING FAT GRAFTING;  Surgeon: Estefana Reichert, DO;  Location:  SURGERY CENTER;  Service: Plastics;  Laterality: Bilateral;   SIMPLE MASTECTOMY WITH AXILLARY SENTINEL NODE BIOPSY  07/14/2012   Procedure: SIMPLE MASTECTOMY WITH AXILLARY SENTINEL NODE BIOPSY;  Surgeon: Debby LABOR. Cornett, MD;  Location: [facility];  Service: General;  Laterality: Right;  Bilateral simple mastectomy with port and right sebtibel lymph node mapping   SIMPLE MASTECTOMY WITH AXILLARY SENTINEL NODE BIOPSY  07/14/2012   Procedure: SIMPLE MASTECTOMY;  Surgeon: Debby LABOR. Cornett, MD;  Location: [facility];  Service: General;  Laterality: Left;   TISSUE EXPANDER PLACEMENT Left 09/24/2013   Procedure: PLACEMENT OF TISSUE EXPANDER AND FLEX HD TO LEFT BREAST;  Surgeon: Estefana Reichert, DO;  Location: [facility];  Service: Plastics;  Laterality: Left;    SOCIAL HISTORY: Social History   Socioeconomic History   Marital status: Married    Spouse name: Not on file   Number of children: 2   Years of education: Not on file   Highest education level: Not on file  Occupational History    Employer: [employer]  Tobacco Use   Smoking status: Never   Smokeless tobacco: Never  Substance and Sexual Activity   Alcohol use: Yes    Comment: occasional   Drug use: No   Sexual activity: Yes    Birth control/protection: Surgical  Other Topics Concern   Not on file  Social History  Narrative   Not on file   Social Drivers of Health   Tobacco Use: Low Risk (05/17/2024)   Patient History    Smoking Tobacco Use: Never    Smokeless Tobacco Use: Never    Passive Exposure: Not on file  Financial Resource Strain: Not on file  Food Insecurity: No Food Insecurity (12/11/2023)   Epic    Worried About Programme Researcher, Broadcasting/film/video in the Last Year: Never true    Ran Out of Food in the Last Year: Never true  Transportation Needs: No Transportation Needs (12/11/2023)   Epic    Lack of Transportation (Medical): No    Lack of Transportation (Non-Medical): No  Physical Activity: Not on file  Stress: Not on file  Social Connections: Not on file  Intimate Partner Violence: Not At Risk (12/11/2023)   Epic    Fear of Current or Ex-Partner: No    Emotionally Abused: No    Physically Abused: No    Sexually Abused: No  Depression (PHQ2-9): Low Risk (07/19/2024)   Depression (PHQ2-9)    PHQ-2 Score: 0  Alcohol Screen: Not on file  Housing: Low Risk (12/11/2023)   Epic    Unable to Pay for Housing in the Last Year: No    Number of Times Moved  in the Last Year: 0    Homeless in the Last Year: No  Utilities: Not At Risk (12/11/2023)   Epic    Threatened with loss of utilities: No  Health Literacy: Not on file    FAMILY HISTORY: Family History  Problem Relation Age of Onset   Hypertension Mother    Alcohol abuse Mother    Heart disease Maternal Grandmother    Stroke Maternal Grandfather    Colon cancer Maternal Aunt 55       alive at 47   Brain cancer Maternal Uncle 43       and lymphoma in early 80s; deceased   Brain cancer Maternal Uncle 60       deceased   Pancreatic cancer Maternal Uncle 45       alive at 67   Breast cancer Cousin 1       mat 1st cousin once removed through mat GF ; deceased   Breast cancer Maternal Aunt        great aunt through mat GF; dx at ? age    Review of Systems  Constitutional:  Negative for appetite change, chills, fatigue, fever and  unexpected weight change.  HENT:   Negative for hearing loss, lump/mass and trouble swallowing.   Eyes:  Negative for eye problems and icterus.  Respiratory:  Negative for chest tightness, cough and shortness of breath.   Cardiovascular:  Negative for chest pain, leg swelling and palpitations.  Gastrointestinal:  Negative for abdominal distention, abdominal pain, constipation, diarrhea, nausea and vomiting.  Endocrine: Negative for hot flashes.  Genitourinary:  Negative for difficulty urinating.   Musculoskeletal:  Negative for arthralgias.  Skin:  Negative for itching and rash.  Neurological:  Negative for dizziness, extremity weakness, headaches and numbness.  Hematological:  Negative for adenopathy. Does not bruise/bleed easily.  Psychiatric/Behavioral:  Negative for depression. The patient is not nervous/anxious.       PHYSICAL EXAMINATION   Onc Performance Status - 07/19/24 1400       ECOG Perf Status   ECOG Perf Status Restricted in physically strenuous activity but ambulatory and able to carry out work of a light or sedentary nature, e.g., light house work, office work      KPS SCALE   KPS % SCORE Able to carry on normal activity, minor s/s of disease            Vitals:   07/19/24 1434  BP: 126/82  Pulse: (!) 120  Resp: 17  Temp: 97.9 F (36.6 C)  SpO2: 97%     Physical Exam Constitutional:      General: She is not in acute distress.    Appearance: Normal appearance. She is not toxic-appearing.  HENT:     Head: Normocephalic and atraumatic.     Mouth/Throat:     Mouth: Mucous membranes are moist.     Pharynx: Oropharynx is clear. No oropharyngeal exudate or posterior oropharyngeal erythema.  Eyes:     General: No scleral icterus. Cardiovascular:     Rate and Rhythm: Normal rate and regular rhythm.     Pulses: Normal pulses.     Heart sounds: Normal heart sounds.  Pulmonary:     Effort: Pulmonary effort is normal.     Breath sounds: Normal breath  sounds.  Abdominal:     General: Abdomen is flat. Bowel sounds are normal. There is no distension.     Palpations: Abdomen is soft.     Tenderness: There is no abdominal  tenderness.  Musculoskeletal:        General: No swelling.     Cervical back: Neck supple.  Lymphadenopathy:     Cervical: No cervical adenopathy.  Skin:    General: Skin is warm and dry.     Findings: No rash.  Neurological:     General: No focal deficit present.     Mental Status: She is alert.  Psychiatric:        Mood and Affect: Mood normal.        Behavior: Behavior normal.     LABORATORY DATA:  CBC    Component Value Date/Time   WBC 5.4 07/19/2024 1412   WBC 11.1 (H) 12/12/2023 0353   RBC 4.53 07/19/2024 1412   HGB 13.7 07/19/2024 1412   HGB 15.9 10/30/2019 1030   HGB 14.7 02/15/2017 0852   HCT 38.8 07/19/2024 1412   HCT 46.1 10/30/2019 1030   HCT 42.9 02/15/2017 0852   PLT 331 07/19/2024 1412   PLT 245 10/30/2019 1030   MCV 85.7 07/19/2024 1412   MCV 88 10/30/2019 1030   MCV 86.7 02/15/2017 0852   MCH 30.2 07/19/2024 1412   MCHC 35.3 07/19/2024 1412   RDW 15.2 07/19/2024 1412   RDW 12.5 10/30/2019 1030   RDW 12.6 02/15/2017 0852   LYMPHSABS 1.7 07/19/2024 1412   LYMPHSABS 1.4 02/15/2017 0852   MONOABS 0.6 07/19/2024 1412   MONOABS 0.3 02/15/2017 0852   EOSABS 0.2 07/19/2024 1412   EOSABS 0.1 02/15/2017 0852   BASOSABS 0.0 07/19/2024 1412   BASOSABS 0.0 02/15/2017 0852    CMP     Component Value Date/Time   NA 143 06/28/2024 1251   NA 140 10/30/2019 1030   NA 141 02/15/2017 0852   K 3.5 06/28/2024 1251   K 3.9 02/15/2017 0852   CL 106 06/28/2024 1251   CL 101 11/15/2012 0904   CO2 23 06/28/2024 1251   CO2 25 02/15/2017 0852   GLUCOSE 98 06/28/2024 1251   GLUCOSE 87 02/15/2017 0852   GLUCOSE 69 (L) 11/15/2012 0904   BUN 10 06/28/2024 1251   BUN 11 10/30/2019 1030   BUN 12.5 02/15/2017 0852   CREATININE 0.66 06/28/2024 1251   CREATININE 0.9 02/15/2017 0852   CALCIUM 9.7  06/28/2024 1251   CALCIUM 9.3 02/15/2017 0852   PROT 7.4 06/28/2024 1251   PROT 6.7 10/30/2019 1030   PROT 7.0 02/15/2017 0852   ALBUMIN 4.4 06/28/2024 1251   ALBUMIN 4.2 10/30/2019 1030   ALBUMIN 3.6 02/15/2017 0852   AST 31 06/28/2024 1251   AST 16 02/15/2017 0852   ALT 25 06/28/2024 1251   ALT 13 02/15/2017 0852   ALKPHOS 61 06/28/2024 1251   ALKPHOS 63 02/15/2017 0852   BILITOT 0.4 06/28/2024 1251   BILITOT 0.69 02/15/2017 0852   GFRNONAA >60 06/28/2024 1251   GFRAA 100 10/30/2019 1030     ASSESSMENT and THERAPY PLAN:   Assessment and Plan Assessment & Plan  Estrogen receptor positive malignant neoplasm of upper-inner quadrant of right breast Significant fatigue from Enhertu , impacting daily activities. Nausea post-infusion improves after one week. Cardiac status monitored by cardiology. Hematologic parameters stable. Dose reduction of Enhertu  indicated due to fatigue. - Reduced Enhertu  (fam-trastuzumab deruxtecan-nxki ) to 4.4 mg/kg IV every 21 days due to fatigue. - Instructed her to schedule subsequent infusion at checkout. - Continued cardiology follow-up for cardiac monitoring (echocardiogram every 6 months); deferred cardiac management to cardiology. - Will plan to repeat imaging in March 2026.  Chronic portal vein thrombosis Remains on chronic anticoagulation for portal vein thrombosis. Laboratory studies stable. - Confirmed continued use of anticoagulation. All questions were answered. The patient knows to call the clinic with any problems, questions or concerns. We can certainly see the patient much sooner if necessary.  Total encounter time:20 minutes*in face-to-face visit time, chart review, lab review, care coordination, order entry, and documentation of the encounter time.  *Total Encounter Time as defined by the Centers for Medicare and Medicaid Services includes, in addition to the face-to-face time of a patient visit (documented in the note above)  non-face-to-face time: obtaining and reviewing outside history, ordering and reviewing medications, tests or procedures, care coordination (communications with other health care professionals or caregivers) and documentation in the medical record.

## 2024-07-19 NOTE — Patient Instructions (Signed)

## 2024-08-09 ENCOUNTER — Inpatient Hospital Stay

## 2024-08-09 ENCOUNTER — Inpatient Hospital Stay: Admitting: Hematology and Oncology

## 2024-08-30 ENCOUNTER — Inpatient Hospital Stay: Attending: Hematology and Oncology

## 2024-08-30 ENCOUNTER — Inpatient Hospital Stay

## 2024-08-30 ENCOUNTER — Inpatient Hospital Stay: Admitting: Hematology and Oncology

## 2024-09-05 ENCOUNTER — Ambulatory Visit (HOSPITAL_COMMUNITY): Admitting: Cardiology

## 2024-09-05 ENCOUNTER — Other Ambulatory Visit (HOSPITAL_COMMUNITY)

## 2024-09-20 ENCOUNTER — Inpatient Hospital Stay

## 2024-09-20 ENCOUNTER — Inpatient Hospital Stay: Attending: Hematology and Oncology

## 2024-09-20 ENCOUNTER — Inpatient Hospital Stay: Admitting: Hematology and Oncology
